# Patient Record
Sex: Male | Born: 1980 | Race: Black or African American | Hispanic: No | Marital: Single | Smoking: Former smoker
Health system: Southern US, Community
[De-identification: ages and names within clinical notes are randomized; demographics above are authoritative.]

## PROBLEM LIST (undated history)

## (undated) DIAGNOSIS — B029 Zoster without complications: Secondary | ICD-10-CM

## (undated) DIAGNOSIS — M1990 Unspecified osteoarthritis, unspecified site: Secondary | ICD-10-CM

## (undated) DIAGNOSIS — Z720 Tobacco use: Secondary | ICD-10-CM

## (undated) DIAGNOSIS — I10 Essential (primary) hypertension: Secondary | ICD-10-CM

## (undated) DIAGNOSIS — J45909 Unspecified asthma, uncomplicated: Secondary | ICD-10-CM

---

## 2007-04-01 ENCOUNTER — Emergency Department: Payer: Self-pay | Admitting: Emergency Medicine

## 2007-04-01 ENCOUNTER — Other Ambulatory Visit: Payer: Self-pay

## 2007-12-16 ENCOUNTER — Emergency Department: Payer: Self-pay | Admitting: Emergency Medicine

## 2008-03-27 ENCOUNTER — Emergency Department: Payer: Self-pay | Admitting: Emergency Medicine

## 2010-09-17 ENCOUNTER — Emergency Department: Payer: Self-pay | Admitting: Emergency Medicine

## 2010-10-15 ENCOUNTER — Emergency Department: Payer: Self-pay | Admitting: *Deleted

## 2010-10-30 ENCOUNTER — Emergency Department: Payer: Self-pay | Admitting: Emergency Medicine

## 2014-07-16 ENCOUNTER — Encounter: Payer: Self-pay | Admitting: Emergency Medicine

## 2014-07-16 ENCOUNTER — Emergency Department: Payer: Self-pay

## 2014-07-16 ENCOUNTER — Emergency Department
Admission: EM | Admit: 2014-07-16 | Discharge: 2014-07-16 | Disposition: A | Discharge status: Home / Self Care | Payer: Self-pay | Attending: Emergency Medicine | Admitting: Emergency Medicine

## 2014-07-16 DIAGNOSIS — Y9289 Other specified places as the place of occurrence of the external cause: Secondary | ICD-10-CM | POA: Insufficient documentation

## 2014-07-16 DIAGNOSIS — S60552A Superficial foreign body of left hand, initial encounter: Secondary | ICD-10-CM | POA: Insufficient documentation

## 2014-07-16 DIAGNOSIS — Y998 Other external cause status: Secondary | ICD-10-CM | POA: Insufficient documentation

## 2014-07-16 DIAGNOSIS — W34010A Accidental discharge of airgun, initial encounter: Secondary | ICD-10-CM | POA: Insufficient documentation

## 2014-07-16 DIAGNOSIS — Z720 Tobacco use: Secondary | ICD-10-CM | POA: Insufficient documentation

## 2014-07-16 DIAGNOSIS — Y9389 Activity, other specified: Secondary | ICD-10-CM | POA: Insufficient documentation

## 2014-07-16 HISTORY — DX: Unspecified asthma, uncomplicated: J45.909

## 2014-07-16 MED ORDER — AMOXICILLIN-POT CLAVULANATE 875-125 MG PO TABS
1.0000 | ORAL_TABLET | Freq: Once | ORAL | Status: AC
  Administered 2014-07-16: 1 via ORAL
  Filled 2014-07-16: qty 1

## 2014-07-16 MED ORDER — BACITRACIN ZINC 500 UNIT/GM EX OINT
TOPICAL_OINTMENT | CUTANEOUS | Status: AC
  Filled 2014-07-16: qty 0.9

## 2014-07-16 MED ORDER — OXYCODONE-ACETAMINOPHEN 5-325 MG PO TABS
1.0000 | ORAL_TABLET | Freq: Once | ORAL | Status: AC
  Administered 2014-07-16: 1 via ORAL
  Filled 2014-07-16: qty 1

## 2014-07-16 MED ORDER — OXYCODONE-ACETAMINOPHEN 5-325 MG PO TABS: 1.0000 | ORAL_TABLET | Freq: Three times a day (TID) | ORAL | Status: DC | PRN

## 2014-07-16 MED ORDER — AMOXICILLIN-POT CLAVULANATE 875-125 MG PO TABS: 1.0000 | ORAL_TABLET | Freq: Two times a day (BID) | ORAL | Status: AC

## 2014-07-16 MED ORDER — IBUPROFEN 800 MG PO TABS: 800.0000 mg | ORAL_TABLET | Freq: Three times a day (TID) | ORAL | Status: DC | PRN

## 2014-07-16 MED ORDER — TETANUS-DIPHTH-ACELL PERTUSSIS 5-2.5-18.5 LF-MCG/0.5 IM SUSP
0.5000 mL | Freq: Once | INTRAMUSCULAR | Status: AC
  Administered 2014-07-16: 0.5 mL via INTRAMUSCULAR
  Filled 2014-07-16: qty 0.5

## 2014-07-16 MED ORDER — LIDOCAINE HCL (PF) 1 % IJ SOLN
10.0000 mL | Freq: Once | INTRAMUSCULAR | Status: AC
  Administered 2014-07-16: 10 mL via SUBCUTANEOUS
  Filled 2014-07-16: qty 10

## 2014-07-16 NOTE — ED Notes (Signed)
Patient with no complaints at this time. Respirations even and unlabored. Skin warm/dry. Discharge instructions reviewed with patient at this time. Patient given opportunity to voice concerns/ask questions. Patient discharged at this time and left Emergency Department with steady gait.   

## 2014-07-16 NOTE — Discharge Instructions (Signed)
You have a foreign body in your hand that was unable to be removed today. Keep clean. Take medication as prescribed.   Follow up with orthopedic in 2 days. See above to call tomorrow morning to schedule appointment. Follow up is important.   Return to ER for swelling, drainage, redness, increased pain,loss of sensation, new or worsening concerns.
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
## 2014-07-16 NOTE — ED Notes (Signed)
Provider Renford DillsLindsey Miller at bedside, to remove foreign body

## 2016-02-03 ENCOUNTER — Encounter: Payer: Self-pay | Admitting: Emergency Medicine

## 2016-02-03 ENCOUNTER — Emergency Department: Payer: Self-pay

## 2016-02-03 ENCOUNTER — Emergency Department
Admission: EM | Admit: 2016-02-03 | Discharge: 2016-02-03 | Disposition: A | Discharge status: Home / Self Care | Payer: Self-pay | Attending: Emergency Medicine | Admitting: Emergency Medicine

## 2016-02-03 DIAGNOSIS — R0789 Other chest pain: Secondary | ICD-10-CM | POA: Insufficient documentation

## 2016-02-03 DIAGNOSIS — F17200 Nicotine dependence, unspecified, uncomplicated: Secondary | ICD-10-CM | POA: Insufficient documentation

## 2016-02-03 DIAGNOSIS — R071 Chest pain on breathing: Secondary | ICD-10-CM

## 2016-02-03 DIAGNOSIS — Z79899 Other long term (current) drug therapy: Secondary | ICD-10-CM | POA: Insufficient documentation

## 2016-02-03 DIAGNOSIS — B9789 Other viral agents as the cause of diseases classified elsewhere: Secondary | ICD-10-CM

## 2016-02-03 DIAGNOSIS — J45909 Unspecified asthma, uncomplicated: Secondary | ICD-10-CM | POA: Insufficient documentation

## 2016-02-03 DIAGNOSIS — J069 Acute upper respiratory infection, unspecified: Secondary | ICD-10-CM | POA: Insufficient documentation

## 2016-02-03 LAB — BASIC METABOLIC PANEL
Anion gap: 9 (ref 5–15)
BUN: 11 mg/dL (ref 6–20)
CALCIUM: 9.1 mg/dL (ref 8.9–10.3)
CO2: 25 mmol/L (ref 22–32)
CREATININE: 0.97 mg/dL (ref 0.61–1.24)
Chloride: 104 mmol/L (ref 101–111)
GFR calc non Af Amer: >60 mL/min (ref >60)
Glucose, Bld: 108 mg/dL — ABNORMAL HIGH (ref 65–99)
Potassium: 4 mmol/L (ref 3.5–5.1)
Sodium: 138 mmol/L (ref 135–145)

## 2016-02-03 LAB — CBC
HCT: 40.2 % (ref 40.0–52.0)
Hemoglobin: 13 g/dL (ref 13.0–18.0)
MCH: 26.5 pg (ref 26.0–34.0)
MCHC: 32.4 g/dL (ref 32.0–36.0)
MCV: 82 fL (ref 80.0–100.0)
PLATELETS: 377 10*3/uL (ref 150–440)
RBC: 4.9 MIL/uL (ref 4.40–5.90)
RDW: 14.4 % (ref 11.5–14.5)
WBC: 7.1 10*3/uL (ref 3.8–10.6)

## 2016-02-03 LAB — TROPONIN I

## 2016-02-03 MED ORDER — IPRATROPIUM-ALBUTEROL 0.5-2.5 (3) MG/3ML IN SOLN
3.0000 mL | Freq: Once | RESPIRATORY_TRACT | Status: AC
  Administered 2016-02-03: 3 mL via RESPIRATORY_TRACT
  Filled 2016-02-03: qty 3

## 2016-02-03 MED ORDER — ALBUTEROL SULFATE HFA 108 (90 BASE) MCG/ACT IN AERS: INHALATION_SPRAY | RESPIRATORY_TRACT | 1 refills | Status: DC

## 2016-02-03 NOTE — Discharge Instructions (Signed)
You have been seen in the Emergency Department (ED) today for chest pain.  As we have discussed today?s test results are normal, and we believe your pain is due to pain/strain and/or inflammation of the muscles and/or cartilage of your chest wall, likely exacerbated by a mild virus (a "cold") and your mild chronic asthma.  We recommend you take ibuprofen 600 mg three times a day with meals for the next 5 days (unless you have been told previously not to take ibuprofen or NSAIDs in general).  You may also take Tylenol according to the label instructions.  Read through the included information for additional treatment recommendations and precautions.  Continue to take your regular medications.   Return to the Emergency Department (ED) if you experience any further chest pain/pressure/tightness, difficulty breathing, or sudden sweating, or other symptoms that concern you.

## 2016-02-03 NOTE — ED Notes (Signed)
Patient transported to X-ray 

## 2016-02-03 NOTE — ED Notes (Signed)
E-signature box not working. Pt verbalized understanding of discharge instructions and denied questions. 

## 2016-02-03 NOTE — ED Triage Notes (Signed)
Pt with c/o chest pain, increasing with coughing and deep breathing. Denies heart history. Non-productive cough.

## 2016-02-03 NOTE — ED Provider Notes (Signed)
[facility] Emergency Department Provider Note  ____________________________________________   First MD Initiated Contact with Patient 02/03/16 1234     (approximate)  I have reviewed the triage vital signs and the nursing notes.   HISTORY  Chief Complaint Chest Pain and Cough    HPI Justin Reid is a 36 y.o. male with a history of tobacco use, asthma, and obesity who presents for evaluation of anterior chest wall pain that started yesterday.  He states that it is worse with coughing and taking deep breaths.  He says that he has been coughing and congested little bit more than usual but does not feel like he has had a cold over the flu.  He ran out of his albuterol this morning and does not have a primary care doctor.  He denies fever/chills, shortness of breath, nausea, vomiting, abdominal pain, dysuria.  He states that his cough is nonproductive.  He has noticed a little bit of wheezing.  Overall his symptoms are mild but he was a little bit concerned because of the chest pain.   Past Medical History:  Diagnosis Date  . Asthma     There are no active problems to display for this patient.   History reviewed. No pertinent surgical history.  Prior to Admission medications   Medication Sig Start Date End Date Taking? Authorizing Provider  albuterol (PROVENTIL HFA;VENTOLIN HFA) 108 (90 Base) MCG/ACT inhaler Inhale 1-2 puffs into the lungs every 6 (six) hours as needed for wheezing or shortness of breath.   Yes Historical Provider, MD  albuterol (PROVENTIL HFA;VENTOLIN HFA) 108 (90 Base) MCG/ACT inhaler Inhale 2-4 puffs by mouth every 4 hours as needed for wheezing, cough, and/or shortness of breath 02/03/16   Loleta Roseory Maximilliano Kersh, MD  ibuprofen (ADVIL,MOTRIN) 800 MG tablet Take 1 tablet (800 mg total) by mouth every 8 (eight) hours as needed for mild pain or moderate pain. 07/16/14   Renford DillsLindsey Miller, NP  oxyCODONE-acetaminophen (ROXICET) 5-325 MG per tablet Take 1  tablet by mouth every 8 (eight) hours as needed for moderate pain or severe pain (Do not drive or operate heavy machinery while taking as can cause drowsiness.). 07/16/14   Renford DillsLindsey Miller, NP    Allergies Patient has no known allergies.  History reviewed. No pertinent family history.  Social History Social History  Substance Use Topics  . Smoking status: Current Every Day Smoker  . Smokeless tobacco: Never Used  . Alcohol use Yes    Review of Systems Constitutional: No fever/chills Eyes: No visual changes. ENT: No sore throat. Cardiovascular: +chest pain, anterior chest wall Respiratory: Denies shortness of breath.  He has had a nonproductive cough for several days Gastrointestinal: No abdominal pain.  No nausea, no vomiting.  No diarrhea.  No constipation. Genitourinary: Negative for dysuria. Musculoskeletal: Negative for back pain. Skin: Negative for rash. Neurological: Negative for headaches, focal weakness or numbness.  10-point ROS otherwise negative.  ____________________________________________   PHYSICAL EXAM:  VITAL SIGNS: ED Triage Vitals  Enc Vitals Group     BP 02/03/16 1037 (!) 149/104     Pulse Rate 02/03/16 1037 91     Resp 02/03/16 1037 18     Temp 02/03/16 1037 98.6 F (37 C)     Temp Source 02/03/16 1037 Oral     SpO2 02/03/16 1037 98 %     Weight 02/03/16 1037 265 lb (120.2 kg)     Height 02/03/16 1037 5\' 10"  (1.778 m)     Head Circumference --  Peak Flow --      Pain Score 02/03/16 1054 9     Pain Loc --      Pain Edu? --      Excl. in GC? --     Constitutional: Alert and oriented. Well appearing and in no acute distress. Eyes: Conjunctivae are normal. PERRL. EOMI. Head: Atraumatic. Nose: No congestion/rhinnorhea. Mouth/Throat: Mucous membranes are moist.  Oropharynx non-erythematous. Neck: No stridor.  No meningeal signs.   Cardiovascular: Normal rate, regular rhythm. Good peripheral circulation. Grossly normal heart sounds.    Respiratory: Normal respiratory effort.  No retractions. Mild expiratory wheezing.  Highly reproducible anterior chest wall pain most notable over the superior sternum. Gastrointestinal: Soft and nontender. No distention.  Musculoskeletal: No lower extremity tenderness nor edema. No gross deformities of extremities. Neurologic:  Normal speech and language. No gross focal neurologic deficits are appreciated.  Skin:  Skin is warm, dry and intact. No rash noted. Psychiatric: Mood and affect are normal. Speech and behavior are normal.  ____________________________________________   LABS (all labs ordered are listed, but only abnormal results are displayed)  Labs Reviewed  BASIC METABOLIC PANEL - Abnormal; Notable for the following:       Result Value   Glucose, Bld 108 (*)    All other components within normal limits  CBC  TROPONIN I   ____________________________________________  EKG  ED ECG REPORT I, Danah Reinecke, the attending physician, personally viewed and interpreted this ECG.  Date: 02/03/2016 EKG Time: 10:36 Rate: 93 Rhythm: normal sinus rhythm with mild sinus Arrhythmia QRS Axis: normal Intervals: normal ST/T Wave abnormalities: normal Conduction Disturbances: none Narrative Interpretation: unremarkable  ____________________________________________  RADIOLOGY   Dg Chest 2 View  Result Date: 02/03/2016 CLINICAL DATA:  Patient reports central chest pain onset yesterday. Hx asthma. Current smoker. EXAM: CHEST  2 VIEW COMPARISON:  03/27/2008 FINDINGS: The heart size and mediastinal contours are within normal limits. Both lungs are clear. No pleural effusion or pneumothorax. The visualized skeletal structures are unremarkable. IMPRESSION: No active cardiopulmonary disease. Electronically Signed   By: Amie Portland M.D.   On: 02/03/2016 11:32    ____________________________________________   PROCEDURES  Procedure(s) performed:   Procedures   Critical Care  performed: No ____________________________________________   INITIAL IMPRESSION / ASSESSMENT AND PLAN / ED COURSE  Pertinent labs & imaging results that were available during my care of the patient were reviewed by me and considered in my medical decision making (see chart for details).  HEART score is 2 for risk factors with the patient is very low risk of this being ACS. He is PERC negative.  His lab work, vital signs, chest x-ray, and EKG are all reassuring.  I had my usual and customary chest wall pain discussion with the patient I will also treat him with albuterol given the very mild expiratory wheezing and the fact that he ran out of medication.  I gave my usual and customary return precautions and he will follow-up as an outpatient.  He understands and agrees with the plan.   Clinical Course as of Feb 02 1306  Tue Feb 03, 2016  1306 Well appearing, NAD, feels better after Duoneb.  Discharged with usual/customary return precautinos  [CF]    Clinical Course User Index [CF] Loleta Rose, MD    ____________________________________________  FINAL CLINICAL IMPRESSION(S) / ED DIAGNOSES  Final diagnoses:  Pain of anterior chest wall with respiration  Viral URI with cough     MEDICATIONS GIVEN DURING THIS VISIT:  Medications  ipratropium-albuterol (DUONEB) 0.5-2.5 (3) MG/3ML nebulizer solution 3 mL (3 mLs Nebulization Given 02/03/16 1253)     NEW OUTPATIENT MEDICATIONS STARTED DURING THIS VISIT:  New Prescriptions   ALBUTEROL (PROVENTIL HFA;VENTOLIN HFA) 108 (90 BASE) MCG/ACT INHALER    Inhale 2-4 puffs by mouth every 4 hours as needed for wheezing, cough, and/or shortness of breath    Modified Medications   No medications on file    Discontinued Medications   ALBUTEROL (PROVENTIL HFA;VENTOLIN HFA) 108 (90 BASE) MCG/ACT INHALER    Inhale into the lungs every 6 (six) hours as needed for wheezing or shortness of breath.     Note:  This document was prepared using  Dragon voice recognition software and may include unintentional dictation errors.    Loleta Rose, MD 02/03/16 1308

## 2016-02-20 ENCOUNTER — Encounter: Payer: Self-pay | Admitting: Emergency Medicine

## 2016-02-20 ENCOUNTER — Inpatient Hospital Stay
Admission: EM | Admit: 2016-02-20 | Discharge: 2016-02-23 | DRG: 193 | Disposition: A | Discharge status: Home / Self Care | Payer: Self-pay | Attending: Internal Medicine | Admitting: Internal Medicine

## 2016-02-20 ENCOUNTER — Emergency Department: Payer: Self-pay

## 2016-02-20 DIAGNOSIS — Z23 Encounter for immunization: Secondary | ICD-10-CM

## 2016-02-20 DIAGNOSIS — Z79899 Other long term (current) drug therapy: Secondary | ICD-10-CM

## 2016-02-20 DIAGNOSIS — J101 Influenza due to other identified influenza virus with other respiratory manifestations: Principal | ICD-10-CM | POA: Diagnosis present

## 2016-02-20 DIAGNOSIS — J4551 Severe persistent asthma with (acute) exacerbation: Secondary | ICD-10-CM | POA: Diagnosis present

## 2016-02-20 DIAGNOSIS — J9601 Acute respiratory failure with hypoxia: Secondary | ICD-10-CM | POA: Diagnosis present

## 2016-02-20 DIAGNOSIS — F17210 Nicotine dependence, cigarettes, uncomplicated: Secondary | ICD-10-CM | POA: Diagnosis present

## 2016-02-20 DIAGNOSIS — R262 Difficulty in walking, not elsewhere classified: Secondary | ICD-10-CM

## 2016-02-20 DIAGNOSIS — J45901 Unspecified asthma with (acute) exacerbation: Secondary | ICD-10-CM | POA: Diagnosis present

## 2016-02-20 DIAGNOSIS — Z79891 Long term (current) use of opiate analgesic: Secondary | ICD-10-CM

## 2016-02-20 DIAGNOSIS — J4531 Mild persistent asthma with (acute) exacerbation: Secondary | ICD-10-CM

## 2016-02-20 DIAGNOSIS — Z6835 Body mass index (BMI) 35.0-35.9, adult: Secondary | ICD-10-CM

## 2016-02-20 LAB — BLOOD GAS, ARTERIAL
Acid-Base Excess: 3.1 mmol/L — ABNORMAL HIGH (ref 0.0–2.0)
Bicarbonate: 23.1 mmol/L (ref 20.0–28.0)
Delivery systems: POSITIVE
EXPIRATORY PAP: 5
FIO2: 0.28
INSPIRATORY PAP: 12
O2 Saturation: 99.7 %
PO2 ART: 167 mmHg — AB (ref 83.0–108.0)
Patient temperature: 37
pCO2 arterial: 23 mmHg — ABNORMAL LOW (ref 32.0–48.0)
pH, Arterial: 7.61 (ref 7.350–7.450)

## 2016-02-20 LAB — CBC
HCT: 34.6 % — ABNORMAL LOW (ref 40.0–52.0)
HEMOGLOBIN: 11.9 g/dL — AB (ref 13.0–18.0)
MCH: 27.7 pg (ref 26.0–34.0)
MCHC: 34.4 g/dL (ref 32.0–36.0)
MCV: 80.6 fL (ref 80.0–100.0)
PLATELETS: 336 10*3/uL (ref 150–440)
RBC: 4.3 MIL/uL — AB (ref 4.40–5.90)
RDW: 14.5 % (ref 11.5–14.5)
WBC: 8 10*3/uL (ref 3.8–10.6)

## 2016-02-20 LAB — MAGNESIUM: Magnesium: 1.9 mg/dL (ref 1.7–2.4)

## 2016-02-20 LAB — BASIC METABOLIC PANEL
ANION GAP: 11 (ref 5–15)
BUN: 16 mg/dL (ref 6–20)
CHLORIDE: 101 mmol/L (ref 101–111)
CO2: 23 mmol/L (ref 22–32)
CREATININE: 1.27 mg/dL — AB (ref 0.61–1.24)
Calcium: 8.8 mg/dL — ABNORMAL LOW (ref 8.9–10.3)
GFR calc non Af Amer: >60 mL/min (ref >60)
Glucose, Bld: 164 mg/dL — ABNORMAL HIGH (ref 65–99)
POTASSIUM: 3.8 mmol/L (ref 3.5–5.1)
SODIUM: 135 mmol/L (ref 135–145)

## 2016-02-20 LAB — GLUCOSE, CAPILLARY
GLUCOSE-CAPILLARY: 144 mg/dL — AB (ref 65–99)
Glucose-Capillary: 179 mg/dL — ABNORMAL HIGH (ref 65–99)

## 2016-02-20 LAB — INFLUENZA PANEL BY PCR (TYPE A & B)
INFLAPCR: NEGATIVE
Influenza B By PCR: POSITIVE — AB

## 2016-02-20 LAB — MRSA PCR SCREENING: MRSA by PCR: NEGATIVE

## 2016-02-20 LAB — PHOSPHORUS: Phosphorus: 3.2 mg/dL (ref 2.5–4.6)

## 2016-02-20 MED ORDER — SODIUM CHLORIDE 0.9 % IV SOLN
INTRAVENOUS | Status: DC
  Administered 2016-02-20 (×2): via INTRAVENOUS

## 2016-02-20 MED ORDER — MAGNESIUM CITRATE PO SOLN: 1.0000 | Freq: Once | ORAL | Status: DC | PRN

## 2016-02-20 MED ORDER — ALBUTEROL SULFATE (2.5 MG/3ML) 0.083% IN NEBU
INHALATION_SOLUTION | RESPIRATORY_TRACT | Status: AC
  Administered 2016-02-20: 7.5 mg via RESPIRATORY_TRACT
  Filled 2016-02-20: qty 9

## 2016-02-20 MED ORDER — ONDANSETRON HCL 4 MG PO TABS: 4.0000 mg | ORAL_TABLET | Freq: Four times a day (QID) | ORAL | Status: DC | PRN

## 2016-02-20 MED ORDER — ACETAMINOPHEN 650 MG RE SUPP: 650.0000 mg | Freq: Four times a day (QID) | RECTAL | Status: DC | PRN

## 2016-02-20 MED ORDER — ENOXAPARIN SODIUM 40 MG/0.4ML ~~LOC~~ SOLN
40.0000 mg | SUBCUTANEOUS | Status: DC
  Administered 2016-02-20 – 2016-02-22 (×3): 40 mg via SUBCUTANEOUS
  Filled 2016-02-20 (×4): qty 0.4

## 2016-02-20 MED ORDER — OXYCODONE HCL 5 MG PO TABS
5.0000 mg | ORAL_TABLET | ORAL | Status: DC | PRN
Start: 2016-02-20 — End: 2016-02-23
  Administered 2016-02-20 – 2016-02-23 (×7): 5 mg via ORAL
  Filled 2016-02-20 (×7): qty 1

## 2016-02-20 MED ORDER — IPRATROPIUM BROMIDE 0.02 % IN SOLN
0.5000 mg | Freq: Four times a day (QID) | RESPIRATORY_TRACT | Status: DC | PRN
  Administered 2016-02-20 – 2016-02-21 (×2): 0.5 mg via RESPIRATORY_TRACT
  Filled 2016-02-20 (×2): qty 2.5

## 2016-02-20 MED ORDER — ALBUTEROL SULFATE (2.5 MG/3ML) 0.083% IN NEBU
2.5000 mg | INHALATION_SOLUTION | Freq: Once | RESPIRATORY_TRACT | Status: AC
  Administered 2016-02-20: 2.5 mg via RESPIRATORY_TRACT
  Filled 2016-02-20: qty 3

## 2016-02-20 MED ORDER — SODIUM CHLORIDE 0.9% FLUSH
3.0000 mL | Freq: Two times a day (BID) | INTRAVENOUS | Status: DC
  Administered 2016-02-20 – 2016-02-22 (×5): 3 mL via INTRAVENOUS

## 2016-02-20 MED ORDER — ALBUTEROL SULFATE (2.5 MG/3ML) 0.083% IN NEBU
2.5000 mg | INHALATION_SOLUTION | Freq: Four times a day (QID) | RESPIRATORY_TRACT | Status: DC | PRN
  Administered 2016-02-20: 2.5 mg via RESPIRATORY_TRACT
  Filled 2016-02-20: qty 3

## 2016-02-20 MED ORDER — ACETAMINOPHEN 325 MG PO TABS: 650.0000 mg | ORAL_TABLET | Freq: Four times a day (QID) | ORAL | Status: DC | PRN

## 2016-02-20 MED ORDER — BISACODYL 5 MG PO TBEC: 5.0000 mg | DELAYED_RELEASE_TABLET | Freq: Every day | ORAL | Status: DC | PRN

## 2016-02-20 MED ORDER — IPRATROPIUM-ALBUTEROL 0.5-2.5 (3) MG/3ML IN SOLN
3.0000 mL | Freq: Once | RESPIRATORY_TRACT | Status: AC
  Administered 2016-02-20: 3 mL via RESPIRATORY_TRACT

## 2016-02-20 MED ORDER — METHYLPREDNISOLONE SODIUM SUCC 125 MG IJ SOLR
60.0000 mg | Freq: Four times a day (QID) | INTRAMUSCULAR | Status: DC
  Administered 2016-02-20 – 2016-02-21 (×4): 60 mg via INTRAVENOUS
  Filled 2016-02-20 (×4): qty 2

## 2016-02-20 MED ORDER — ALBUTEROL SULFATE (2.5 MG/3ML) 0.083% IN NEBU
7.5000 mg | INHALATION_SOLUTION | Freq: Once | RESPIRATORY_TRACT | Status: AC
  Administered 2016-02-20: 7.5 mg via RESPIRATORY_TRACT

## 2016-02-20 MED ORDER — ALBUTEROL SULFATE (2.5 MG/3ML) 0.083% IN NEBU
5.0000 mg | INHALATION_SOLUTION | Freq: Once | RESPIRATORY_TRACT | Status: AC
  Administered 2016-02-20: 5 mg via RESPIRATORY_TRACT

## 2016-02-20 MED ORDER — ONDANSETRON HCL 4 MG/2ML IJ SOLN: 4.0000 mg | Freq: Four times a day (QID) | INTRAMUSCULAR | Status: DC | PRN

## 2016-02-20 MED ORDER — IPRATROPIUM-ALBUTEROL 0.5-2.5 (3) MG/3ML IN SOLN
RESPIRATORY_TRACT | Status: AC
  Administered 2016-02-20: 3 mL via RESPIRATORY_TRACT
  Filled 2016-02-20: qty 3

## 2016-02-20 MED ORDER — ALBUTEROL SULFATE (2.5 MG/3ML) 0.083% IN NEBU
2.5000 mg | INHALATION_SOLUTION | RESPIRATORY_TRACT | Status: DC | PRN
  Administered 2016-02-20 – 2016-02-21 (×2): 2.5 mg via RESPIRATORY_TRACT
  Filled 2016-02-20 (×2): qty 3

## 2016-02-20 MED ORDER — SENNOSIDES-DOCUSATE SODIUM 8.6-50 MG PO TABS: 1.0000 | ORAL_TABLET | Freq: Every evening | ORAL | Status: DC | PRN

## 2016-02-20 MED ORDER — OSELTAMIVIR PHOSPHATE 75 MG PO CAPS
75.0000 mg | ORAL_CAPSULE | Freq: Two times a day (BID) | ORAL | Status: DC
  Administered 2016-02-20 – 2016-02-23 (×6): 75 mg via ORAL
  Filled 2016-02-20 (×6): qty 1

## 2016-02-20 NOTE — ED Notes (Signed)
ED Provider at bedside. 

## 2016-02-20 NOTE — Care Management Note (Addendum)
Case Management Note  Patient Details  Name: Justin Reid MRN: [id number] Date of Birth: 05-Sep-1980  Subjective/Objective:                   Spoke briefly with patient as he is requiring BiPAP currently and I did not want to exert his O2. He has problems paying for medications. He does not have a PCP for his asthma.  Action/Plan: Referral to [facility] and [facility].  He will also need to be followed for [facility] assistance by weekend CM if patient discharges over weekend. Applications to [facility] left with patient. I met again with patient to deliver applications and encouraged him to follow through with plan of care and discharge referrals. He agreed. I explained that Medication management was a long-term plan and that if MATCH is needed it can only be accessed one time- he again agreed.   Expected Discharge Date:                  Expected Discharge Plan:     In-House Referral:     Discharge planning Services  CM Consult, Medication Assistance, [facility], [facility]  Post Acute Care Choice:    Choice offered to:  Patient  DME Arranged:    DME Agency:     HH Arranged:    [place] Agency:     Status of Service:  In process, will continue to follow  If discussed at Long Length of Stay Meetings, dates discussed:    Additional Comments:  Marshell Garfinkel, RN 02/20/2016, 9:12 AM

## 2016-02-20 NOTE — Progress Notes (Addendum)
[facility] [place] [facility] [place]   PATIENT NAME: Justin Reid    MR#:  [id number]  DATE OF BIRTH:  Feb 06, 1980  SUBJECTIVE:  Patient still tight and sob  REVIEW OF SYSTEMS:    Review of Systems  Constitutional: Positive for malaise/fatigue. Negative for chills and fever.  HENT: Negative.  Negative for ear discharge, ear pain, hearing loss, nosebleeds and sore throat.   Eyes: Negative.  Negative for blurred vision and pain.  Respiratory: Positive for shortness of breath and wheezing. Negative for cough and hemoptysis.   Cardiovascular: Negative.  Negative for chest pain, palpitations and leg swelling.  Gastrointestinal: Negative.  Negative for abdominal pain, blood in stool, diarrhea, nausea and vomiting.  Genitourinary: Negative.  Negative for dysuria.  Musculoskeletal: Negative.  Negative for back pain.  Skin: Negative.   Neurological: Positive for weakness. Negative for dizziness, tremors, speech change, focal weakness, seizures and headaches.  Endo/Heme/Allergies: Negative.  Does not bruise/bleed easily.  Psychiatric/Behavioral: Negative.  Negative for depression, hallucinations and suicidal ideas.    Tolerating Diet: yes      DRUG ALLERGIES:  No Known Allergies  VITALS:  Blood pressure 137/88, pulse 83, temperature 97.6 F (36.4 C), temperature source Axillary, resp. rate 15, height 6' (1.829 m), weight 119.9 kg (264 lb 5.3 oz), SpO2 97 %.  PHYSICAL EXAMINATION:   Physical Exam  Constitutional: He is oriented to person, place, and time and well-developed, well-nourished, and in no distress. No distress.  Wearing BIPAP  HENT:  Head: Normocephalic.  Eyes: No scleral icterus.  Neck: Normal range of motion. Neck supple. No JVD present. No tracheal deviation present.  Cardiovascular: Normal rate, regular rhythm and normal heart sounds.  Exam reveals no gallop and no friction rub.   No murmur heard. Pulmonary/Chest: Effort normal. No respiratory  distress. He has wheezes. He has no rales. He exhibits no tenderness.  Tight sounding fair air entry  Abdominal: Soft. Bowel sounds are normal. He exhibits no distension and no mass. There is no tenderness. There is no rebound and no guarding.  Musculoskeletal: Normal range of motion. He exhibits no edema.  Neurological: He is alert and oriented to person, place, and time.  Skin: Skin is warm. No rash noted. No erythema.  Psychiatric: Affect and judgment normal.      LABORATORY PANEL:   CBC  Recent Labs Lab 02/20/16 0852  WBC 8.0  HGB 11.9*  HCT 34.6*  PLT 336   ------------------------------------------------------------------------------------------------------------------  Chemistries   Recent Labs Lab 02/20/16 0852  NA 135  K 3.8  CL 101  CO2 23  GLUCOSE 164*  BUN 16  CREATININE 1.27*  CALCIUM 8.8*  MG 1.9   ------------------------------------------------------------------------------------------------------------------  Cardiac Enzymes No results for input(s): TROPONINI in the last 168 hours. ------------------------------------------------------------------------------------------------------------------  RADIOLOGY:  Dg Chest Portable 1 View  Result Date: 02/20/2016 CLINICAL DATA:  Cough and wheeze x2 days EXAM: PORTABLE CHEST 1 VIEW COMPARISON:  02/03/2016 CXR FINDINGS: The heart size and mediastinal contours are within normal limits. Both lungs are clear. The visualized skeletal structures are unremarkable. IMPRESSION: No active disease. Electronically Signed   By: Tollie Eth M.D.   On: 02/20/2016 01:05     ASSESSMENT AND PLAN:    36 year old male with history of asthma who presents with acute exacerbation of asthma.  1. Acute hypoxic respiratory failure due to Acute asthma exacerbation: Patient will need to continue BiPAP. Continue steroids and nebulizers  2. Influenza B: Continue Tamiflu for total 5 days  Management plans discussed with  the patient and he is in agreement.  CODE STATUS: FULL  CRITICAL CARETOTAL TIME TAKING CARE OF THIS PATIENT: 30 minutes.  High risk of resp failure/intubation   POSSIBLE D/C 2-4 days, DEPENDING ON CLINICAL CONDITION.   Stanley Helmuth M.D on 02/20/2016 at 12:11 PM  Between 7am to 6pm - Pager - [phone] After 6pm go to [email or web address] - Social research officer, governmentpassword [email or web address]  [facility]  Office  [phone]  CC: Primary care physician; No PCP Per Patient  Note: This dictation was prepared with Dragon dictation along with smaller phrase technology. Any transcriptional errors that result from this process are unintentional.

## 2016-02-20 NOTE — ED Provider Notes (Addendum)
[facility] Emergency Department Provider Note    First MD Initiated Contact with Patient 02/20/16 [phone]     (approximate)  I have reviewed the triage vital signs and the nursing notes.   HISTORY  Chief Complaint Shortness of Breath   HPI Justin Reid is a 36 y.o. male with history of asthma presents with 2 day history of cough congestion and wheezing and subjective fever. Patient presented via EMS and rest or distress status post receiving 2 DuoNeb's treatment as well as 125 mg of Solu-Medrol. Patient admits to one sick contact his child which she believes may have the flu.   Past Medical History:  Diagnosis Date  . Asthma     There are no active problems to display for this patient.   History reviewed. No pertinent surgical history.  Prior to Admission medications   Medication Sig Start Date End Date Taking? Authorizing Provider  albuterol (PROVENTIL HFA;VENTOLIN HFA) 108 (90 Base) MCG/ACT inhaler Inhale 2-4 puffs by mouth every 4 hours as needed for wheezing, cough, and/or shortness of breath 02/03/16   Loleta Roseory Forbach, MD  albuterol (PROVENTIL HFA;VENTOLIN HFA) 108 (90 Base) MCG/ACT inhaler Inhale 1-2 puffs into the lungs every 6 (six) hours as needed for wheezing or shortness of breath.    Historical Provider, MD  ibuprofen (ADVIL,MOTRIN) 800 MG tablet Take 1 tablet (800 mg total) by mouth every 8 (eight) hours as needed for mild pain or moderate pain. 07/16/14   Renford DillsLindsey Miller, NP  oxyCODONE-acetaminophen (ROXICET) 5-325 MG per tablet Take 1 tablet by mouth every 8 (eight) hours as needed for moderate pain or severe pain (Do not drive or operate heavy machinery while taking as can cause drowsiness.). 07/16/14   Renford DillsLindsey Miller, NP    Allergies Patient has no known allergies.  No family history on file.  Social History Social History  Substance Use Topics  . Smoking status: Current Every Day Smoker  . Smokeless tobacco: Never Used  . Alcohol  use Yes    Review of Systems Constitutional: Positive for fever/chills Eyes: No visual changes. ENT: No sore throat. Cardiovascular: Denies chest pain. Respiratory: Positive for cough dyspnea Gastrointestinal: No abdominal pain.  No nausea, no vomiting.  No diarrhea.  No constipation. Genitourinary: Negative for dysuria. Musculoskeletal: Negative for back pain. Skin: Negative for rash. Neurological: Negative for headaches, focal weakness or numbness.  10-point ROS otherwise negative.  ____________________________________________   PHYSICAL EXAM:  VITAL SIGNS: ED Triage Vitals [02/20/16 0029]  Enc Vitals Group     BP 126/79     Pulse Rate (!) 114     Resp (!) 23     Temp 99.6 F (37.6 C)     Temp Source Oral     SpO2 99 %     Weight 265 lb (120.2 kg)     Height 5\' 10"  (1.778 m)     Head Circumference      Peak Flow      Pain Score      Pain Loc      Pain Edu?      Excl. in GC?     Constitutional: Alert and oriented. Apparent respiratory distress. Eyes: Conjunctivae are normal. PERRL. EOMI. Head: Atraumatic. Ears:  Healthy appearing ear canals and TMs bilaterally Nose: No congestion/rhinnorhea. Mouth/Throat: Mucous membranes are moist. Neck: No stridor.  No meningeal signs.   Cardiovascular: Tachycardia regular rhythm. Good peripheral circulation. Grossly normal heart sounds. Respiratory: Tachypnea, diffuse expiratory wheezes, positive accessory respiratory muscle  use Gastrointestinal: Soft and nontender. No distention.  Musculoskeletal: No lower extremity tenderness nor edema. No gross deformities of extremities. Neurologic:  Normal speech and language. No gross focal neurologic deficits are appreciated.  Skin:  Skin is warm, dry and intact. No rash noted.   ____________________________________________   LABS (all labs ordered are listed, but only abnormal results are displayed)  Labs Reviewed  INFLUENZA PANEL BY PCR (TYPE A & B) - Abnormal; Notable for  the following:       Result Value   Influenza B By PCR POSITIVE (*)    All other components within normal limits  BLOOD GAS, ARTERIAL   ____________________________________________  EKG  ED ECG REPORT I, [place] N Lailie Smead, the attending physician, personally viewed and interpreted this ECG.   Date: 02/20/2016  EKG Time: 12:52 AM  Rate: 119  Rhythm normal sinus rhythm  Axis: Normal  Intervals: Normal  ST&T Change: None  RADIOLOGY I, [place] N Braden Cimo, personally viewed and evaluated these images (plain radiographs) as part of my medical decision making, as well as reviewing the written report by the radiologist.  Dg Chest Portable 1 View  Result Date: 02/20/2016 CLINICAL DATA:  Cough and wheeze x2 days EXAM: PORTABLE CHEST 1 VIEW COMPARISON:  02/03/2016 CXR FINDINGS: The heart size and mediastinal contours are within normal limits. Both lungs are clear. The visualized skeletal structures are unremarkable. IMPRESSION: No active disease. Electronically Signed   By: Tollie Eth M.D.   On: 02/20/2016 01:05     Procedures   Critical Care performed: CRITICAL CARE Performed by: Darci Current   Total critical care time: 45 minutes  Critical care time was exclusive of separately billable procedures and treating other patients.  Critical care was necessary to treat or prevent imminent or life-threatening deterioration.  Critical care was time spent personally by me on the following activities: development of treatment plan with patient and/or surrogate as well as nursing, discussions with consultants, evaluation of patient's response to treatment, examination of patient, obtaining history from patient or surrogate, ordering and performing treatments and interventions, ordering and review of laboratory studies, ordering and review of radiographic studies, pulse oximetry and re-evaluation of patient's condition.  ____________________________________________   INITIAL IMPRESSION /  ASSESSMENT AND PLAN / ED COURSE  Pertinent labs & imaging results that were available during my care of the patient were reviewed by me and considered in my medical decision making (see chart for details).  Patient received an additional DuoNeb in the emergency department on arrival. Patient continued to be is same clinical state and a such 3 albuterol nebulized treatments were administered. Patient continued to be tachypnea with accessory rest or muscle use and a such BiPAP was applied. Patient noted to be positive for the flu. Patient discussed with [facility]. [facility] admission for further evaluation and management    ____________________________________________  FINAL CLINICAL IMPRESSION(S) / ED DIAGNOSES  Final diagnoses:  Influenza B  Severe persistent asthma with exacerbation     MEDICATIONS GIVEN DURING THIS VISIT:  Medications  albuterol (PROVENTIL) (2.5 MG/3ML) 0.083% nebulizer solution 5 mg (5 mg Nebulization Given 02/20/16 0037)  ipratropium-albuterol (DUONEB) 0.5-2.5 (3) MG/3ML nebulizer solution 3 mL (3 mLs Nebulization Given 02/20/16 0135)  albuterol (PROVENTIL) (2.5 MG/3ML) 0.083% nebulizer solution 7.5 mg (7.5 mg Nebulization Given 02/20/16 0203)     NEW OUTPATIENT MEDICATIONS STARTED DURING THIS VISIT:  New Prescriptions   No medications on file    Modified Medications   No medications on file  Discontinued Medications   No medications on file     Note:  This document was prepared using Dragon voice recognition software and may include unintentional dictation errors.    Darci Current, MD 02/20/16 1610    Darci Current, MD 02/20/16 [phone]

## 2016-02-20 NOTE — Progress Notes (Signed)
Pt returned to Bipap due to increased WOB

## 2016-02-20 NOTE — ED Notes (Signed)
RT called to set up patient on BiPaP.

## 2016-02-20 NOTE — Progress Notes (Signed)
[facility]  Critical Care Medicine Consultation     ASSESSMENT/PLAN   Patient with chronic persistent asthma with recurrent exacerbation. Is home regimen did not include inhaled corticosteroids. He was using mostly short-acting beta agonist as needed. Was also recently seen in the emergency room 2 weeks prior for the same symptoms. Now with influenza B positive presently on Tamiflu. Continue Solu-Medrol, albuterol, Atrovent, will add inhaled corticosteroid as we wean down on the Solu-Medrol. Patient will need to go home on inhaled cortical steroid with long-acting beta agonist with short-acting rescue. We'll continue noninvasive ventilation at this point and try to give him time off supplemental oxygen. We'll obtain baseline laboratory evaluation today did chest x-ray reviewed is clear with hyperinflation without any parenchymal infiltrates.   ---------------------------------------  ---------------------------------------   Name: Justin Reid MRN: [id number] DOB: [birth year]    ADMISSION DATE:  02/20/2016 CONSULTATION DATE:  02/20/2016  REFERRING MD :  Dr. Juliene Pina  CHIEF COMPLAINT:  Shortness of breath   HISTORY OF PRESENT ILLNESS:  Justin Reid is a 36 year old African American gentleman with a past medical history remarkable for chronic persistent asthma, his normal state of health until approximately 2 days prior where he developed flulike symptoms to include fever, chills, night sweats, generalized Minasian, body aches, cough or congestion and diffuse expiratory wheezing. He states his home inhaler regimen includes albuterol. Denies being on inhaled cortical steroids or recently on systemic steroids. He presented to the emergency department instruments, will start her on noninvasive ventilation and admitted to the intensive care unit. Presently he is awake, alert, communicating, states he is feeling better, continues to be on noninvasive ventilation.  PAST MEDICAL HISTORY :  Past  Medical History:  Diagnosis Date  . Asthma    History reviewed. No pertinent surgical history. Prior to Admission medications   Medication Sig Start Date End Date Taking? Authorizing Provider  albuterol (PROVENTIL HFA;VENTOLIN HFA) 108 (90 Base) MCG/ACT inhaler Inhale 2-4 puffs by mouth every 4 hours as needed for wheezing, cough, and/or shortness of breath 02/03/16  Yes Loleta Rose, MD   No Known Allergies  FAMILY HISTORY:  No family history on file. SOCIAL HISTORY:  reports that he has been smoking.  He has never used smokeless tobacco. He reports that he drinks alcohol. His drug history is not on file.  REVIEW OF SYSTEMS:   Constitutional: Feels well. Cardiovascular: No chest pain.  Pulmonary: Denies dyspnea.   The remainder of systems were reviewed and were found to be negative other than what is documented in the HPI.    VITAL SIGNS: Temp:  [97.8 F (36.6 C)-99.6 F (37.6 C)] 97.8 F (36.6 C) (02/16 0600) Pulse Rate:  [96-115] 96 (02/16 0700) Resp:  [14-28] 19 (02/16 0700) BP: (126-161)/(74-96) 143/96 (02/16 0700) SpO2:  [97 %-100 %] 100 % (02/16 0700) FiO2 (%):  [28 %] 28 % (02/16 0600) Weight:  [119.9 kg (264 lb 5.3 oz)-120.2 kg (265 lb)] 119.9 kg (264 lb 5.3 oz) (02/16 0600) HEMODYNAMICS:   VENTILATOR SETTINGS: FiO2 (%):  [28 %] 28 % INTAKE / OUTPUT: No intake or output data in the 24 hours ending 02/20/16 0838  Physical Examination:   VS: BP (!) 143/96   Pulse 96   Temp 97.8 F (36.6 C) (Axillary)   Resp 19   Ht 6' (1.829 m)   Wt 119.9 kg (264 lb 5.3 oz)   SpO2 100%   BMI 35.85 kg/m   General Appearance: Awake, alert, oriented, in mild respiratory distress on noninvasive ventilation  Neuro:without focal findings, mental status, speech normal,. HEENT: PERRLA, EOM intact, no ptosis, no other lesions noticed;  Pulmonary: Prolonged expiratory phase, diffuse expiratory wheezing appreciated with central congestion CardiovascularNormal S1,S2.  No m/r/g.      Abdomen: Benign, Soft, non-tender, No masses, hepatosplenomegaly, No lymphadenopathy Renal:  No costovertebral tenderness  GU:  Not performed at this time. Endoc: No evident thyromegaly, no signs of acromegaly. Skin:   warm, no rashes, no ecchymosis  Extremities: normal, no cyanosis, clubbing, no edema, warm with normal capillary refill.    LABS: Reviewed   LABORATORY PANEL:   CBC No results for input(s): WBC, HGB, HCT, PLT in the last 168 hours.  Chemistries  No results for input(s): NA, K, CL, CO2, GLUCOSE, BUN, CREATININE, CALCIUM, MG, PHOS, AST, ALT, ALKPHOS, BILITOT in the last 168 hours.  Invalid input(s): GFRCGP   Recent Labs Lab 02/20/16 0633  GLUCAP 179*    Recent Labs Lab 02/20/16 0203  PHART 7.61*  PCO2ART 23*  PO2ART 167*   No results for input(s): AST, ALT, ALKPHOS, BILITOT, ALBUMIN in the last 168 hours.  Cardiac Enzymes No results for input(s): TROPONINI in the last 168 hours.  RADIOLOGY:  Dg Chest Portable 1 View  Result Date: 02/20/2016 CLINICAL DATA:  Cough and wheeze x2 days EXAM: PORTABLE CHEST 1 VIEW COMPARISON:  02/03/2016 CXR FINDINGS: The heart size and mediastinal contours are within normal limits. Both lungs are clear. The visualized skeletal structures are unremarkable. IMPRESSION: No active disease. Electronically Signed   By: Tollie Ethavid  Kwon M.D.   On: 02/20/2016 01:05     Tora KindredJohn Wylie Coon, DO [facility] Office Number: [phone]  Santiago Gladavid Kasa, M.D.  Stephanie AcreVishal Mungal, M.D.  Billy Fischeravid Simonds, M.D   02/20/2016, 8:38 AM

## 2016-02-20 NOTE — H&P (Signed)
History and Physical   [facility] [place] [facility] Admission History and Physical [employer] Chelsi Warr, D.O.    Patient Name: Justin Reid MR#: [id number] Date of Birth: 06/12/1980 Date of Admission: 02/20/2016  Referring MD/NP/PA: Dr. Manson PasseyBrown Primary Care Physician: No PCP Per Patient Outpatient Specialists: None  Patient coming from: Home  Chief Complaint: Shortness of breath  HPI: Justin Reid 36 y.o. male with Reid known history of asthma was in Reid usual state of health until 2 days ago when he developed flulike symptoms including fevers, chills, generalized fatigue, muscle aches, cough with congestion. Patient states that he developed shortness of breath this morning that was refractory to home inhalers. He came to the emergency department for severe shortness of breath. He states that he took some Advil but no other medications. He has not sought any other medical attention for his symptoms.   Otherwise there has been no change in status. Patient has been taking medication as prescribed and there has been no recent change in medication or diet.  No recent antibiotics.  There has been no recent illness, hospitalizations, travel or sick contacts.    Patient denies dizziness, chest pain, N/V/C/D, abdominal pain, dysuria/frequency, changes in mental status.   ED Course: Patient received Solu-Medrol, DuoNeb's, trial of BiPAP  Review of Systems:  CONSTITUTIONAL: Positive fever/chills, fatigue, weakness, headache. EYES: No blurry or double vision. ENT: No tinnitus, postnasal drip, redness or soreness of the oropharynx. RESPIRATORY: Positive cough, congestion, dyspnea, wheeze.  No hemoptysis.  CARDIOVASCULAR: No chest pain, palpitations, syncope, orthopnea. No lower extremity edema.  GASTROINTESTINAL: No nausea, vomiting, abdominal pain, diarrhea, constipation.  No hematemesis, melena or hematochezia. GENITOURINARY: No dysuria, frequency, hematuria. ENDOCRINE: No polyuria or  nocturia. No heat or cold intolerance. HEMATOLOGY: No anemia, bruising, bleeding. INTEGUMENTARY: No rashes, ulcers, lesions. MUSCULOSKELETAL: No arthritis, gout, dyspnea. NEUROLOGIC: No numbness, tingling, ataxia, seizure-type activity, weakness. PSYCHIATRIC: No anxiety, depression, insomnia.   Past Medical History:  Diagnosis Date  . Asthma     History reviewed. No pertinent surgical history.   reports that he has been smoking.  He has never used smokeless tobacco. He reports that he drinks alcohol. His drug history is not on file.  No Known Allergies  No family history on file.   Prior to Admission medications   Medication Sig Start Date End Date Taking? Authorizing Provider  albuterol (PROVENTIL HFA;VENTOLIN HFA) 108 (90 Base) MCG/ACT inhaler Inhale 2-4 puffs by mouth every 4 hours as needed for wheezing, cough, and/or shortness of breath 02/03/16   Loleta Roseory Forbach, MD  albuterol (PROVENTIL HFA;VENTOLIN HFA) 108 (90 Base) MCG/ACT inhaler Inhale 1-2 puffs into the lungs every 6 (six) hours as needed for wheezing or shortness of breath.    Historical Provider, MD  ibuprofen (ADVIL,MOTRIN) 800 MG tablet Take 1 tablet (800 mg total) by mouth every 8 (eight) hours as needed for mild pain or moderate pain. 07/16/14   Renford DillsLindsey Miller, NP  oxyCODONE-acetaminophen (ROXICET) 5-325 MG per tablet Take 1 tablet by mouth every 8 (eight) hours as needed for moderate pain or severe pain (Do not drive or operate heavy machinery while taking as can cause drowsiness.). 07/16/14   Renford DillsLindsey Miller, NP    Physical Exam: Vitals:   02/20/16 0100 02/20/16 0130 02/20/16 0203 02/20/16 0204  BP: 131/74 (!) 150/95    Pulse: (!) 115 (!) 115  (!) 115  Resp: 19 (!) 22  (!) 28  Temp:      TempSrc:  SpO2: 97% 100% 100% 100%  Weight:      Height:        GENERAL: 36 y.o.-year-old black male patient, well-developed, well-nourished lying in the bed in no acute distress.  Pleasant and cooperative.  BiPap in  place HEENT: Head atraumatic, normocephalic. Pupils equal, round, reactive to light and accommodation. No scleral icterus. Extraocular muscles intact. Nares are patent. Oropharynx is clear. Mucus membranes moist. NECK: Supple, full range of motion. No JVD, no bruit heard. No thyroid enlargement, no tenderness, no cervical lymphadenopathy. CHEST: Good air movement, diffuse wheezing positive use of accessory muscles of respiration.  No reproducible chest wall tenderness.  CARDIOVASCULAR: S1, S2 normal. No murmurs, rubs, or gallops. Cap refill <2 seconds. Pulses intact distally.  ABDOMEN: Soft, nondistended, nontender. No rebound, guarding, rigidity. Normoactive bowel sounds present in all four quadrants. No organomegaly or mass. EXTREMITIES: No pedal edema, cyanosis, or clubbing. No calf tenderness or Homan's sign.  NEUROLOGIC: The patient is alert and oriented x 3. Cranial nerves II through XII are grossly intact with no focal sensorimotor deficit. Muscle strength 5/5 in all extremities. Sensation intact. Gait not checked.     Labs on Admission:  CBC: No results for input(s): WBC, NEUTROABS, HGB, HCT, MCV, PLT in the last 168 hours. Basic Metabolic Panel: No results for input(s): NA, K, CL, CO2, GLUCOSE, BUN, CREATININE, CALCIUM, MG, PHOS in the last 168 hours. GFR: Estimated Creatinine Clearance: 139.5 mL/min (by C-G formula based on SCr of 0.97 mg/dL). Liver Function Tests: No results for input(s): AST, ALT, ALKPHOS, BILITOT, PROT, ALBUMIN in the last 168 hours. No results for input(s): LIPASE, AMYLASE in the last 168 hours. No results for input(s): AMMONIA in the last 168 hours. Coagulation Profile: No results for input(s): INR, PROTIME in the last 168 hours. Cardiac Enzymes: No results for input(s): CKTOTAL, CKMB, CKMBINDEX, TROPONINI in the last 168 hours. BNP (last 3 results) No results for input(s): PROBNP in the last 8760 hours. HbA1C: No results for input(s): HGBA1C in the last  72 hours. CBG: No results for input(s): GLUCAP in the last 168 hours. Lipid Profile: No results for input(s): CHOL, HDL, LDLCALC, TRIG, CHOLHDL, LDLDIRECT in the last 72 hours. Thyroid Function Tests: No results for input(s): TSH, T4TOTAL, FREET4, T3FREE, THYROIDAB in the last 72 hours. Anemia Panel: No results for input(s): VITAMINB12, FOLATE, FERRITIN, TIBC, IRON, RETICCTPCT in the last 72 hours. Urine analysis: No results found for: COLORURINE, APPEARANCEUR, LABSPEC, PHURINE, GLUCOSEU, HGBUR, BILIRUBINUR, KETONESUR, PROTEINUR, UROBILINOGEN, NITRITE, LEUKOCYTESUR Sepsis Labs: @LABRCNTIP (procalcitonin:4,lacticidven:4) )No results found for this or any previous visit (from the past 240 hour(s)).   Radiological Exams on Admission: Dg Chest Portable 1 View  Result Date: 02/20/2016 CLINICAL DATA:  Cough and wheeze x2 days EXAM: PORTABLE CHEST 1 VIEW COMPARISON:  02/03/2016 CXR FINDINGS: The heart size and mediastinal contours are within normal limits. Both lungs are clear. The visualized skeletal structures are unremarkable. IMPRESSION: No active disease. Electronically Signed   By: Tollie Eth M.D.   On: 02/20/2016 01:05    Assessment/Plan  This is Reid 36 y.o. male with Reid history of asthma now being admitted with: 1. Acute exacerbation of asthma  Asthma exacerbation: -Admit to stepdown continuous pulse oximetry. -Continue nebulizers, O2 and tapering steroids -Continue inhaled steroids -Expectorant as needed -Consider pulmonology consult if not improving.   2. Influenza B - Start Tamiflu - Check sputum culture  Admission status: Inpatient, stepdown IV Fluids: NS Diet/Nutrition: NPO while on BiPAP Consults called: Pulm  DVT Px:  SCDs and early ambulation. Code Status: Full Code  Disposition Plan: To home in 1-2 days   All the records are reviewed and case discussed with ED provider. Management plans discussed with the patient and/or family who express understanding and agree  with plan of care.  Darris Staiger D.O. on 02/20/2016 at 2:24 AM Between 7am to 6pm - Pager - [phone] After 6pm go to [email or web address] - [employer] [facility] Office [phone] CC: Primary care physician; No PCP Per Patient   02/20/2016, 2:24 AM

## 2016-02-20 NOTE — ED Notes (Signed)
XR at bedside

## 2016-02-20 NOTE — ED Triage Notes (Signed)
Pt arrived from home with hx of asthma ans SOB upon arrival to ER.  Patient treated en route with 2 duo nebs and 125mg  SoluMedrol.  MD present at triage.

## 2016-02-21 MED ORDER — FLUTICASONE FUROATE-VILANTEROL 200-25 MCG/INH IN AEPB
1.0000 | INHALATION_SPRAY | Freq: Every day | RESPIRATORY_TRACT | Status: DC
  Administered 2016-02-21 – 2016-02-23 (×3): 1 via RESPIRATORY_TRACT
  Filled 2016-02-21: qty 28

## 2016-02-21 MED ORDER — PNEUMOCOCCAL VAC POLYVALENT 25 MCG/0.5ML IJ INJ
0.5000 mL | INJECTION | INTRAMUSCULAR | Status: DC
  Filled 2016-02-21: qty 0.5

## 2016-02-21 MED ORDER — INFLUENZA VAC SPLIT QUAD 0.5 ML IM SUSY
0.5000 mL | PREFILLED_SYRINGE | INTRAMUSCULAR | Status: AC
  Administered 2016-02-22: 0.5 mL via INTRAMUSCULAR
  Filled 2016-02-21: qty 0.5

## 2016-02-21 MED ORDER — ALBUTEROL SULFATE (2.5 MG/3ML) 0.083% IN NEBU
3.0000 mL | INHALATION_SOLUTION | Freq: Four times a day (QID) | RESPIRATORY_TRACT | Status: DC | PRN
  Administered 2016-02-21: 3 mL via RESPIRATORY_TRACT
  Filled 2016-02-21: qty 3

## 2016-02-21 MED ORDER — ALBUTEROL SULFATE (2.5 MG/3ML) 0.083% IN NEBU
2.5000 mg | INHALATION_SOLUTION | Freq: Four times a day (QID) | RESPIRATORY_TRACT | Status: DC | PRN
  Administered 2016-02-21 – 2016-02-22 (×4): 2.5 mg via RESPIRATORY_TRACT
  Filled 2016-02-21 (×4): qty 3

## 2016-02-21 MED ORDER — ALBUTEROL SULFATE HFA 108 (90 BASE) MCG/ACT IN AERS: 2.0000 | INHALATION_SPRAY | Freq: Four times a day (QID) | RESPIRATORY_TRACT | Status: DC | PRN

## 2016-02-21 MED ORDER — METHYLPREDNISOLONE SODIUM SUCC 125 MG IJ SOLR
60.0000 mg | Freq: Three times a day (TID) | INTRAMUSCULAR | Status: DC
  Administered 2016-02-21 – 2016-02-22 (×4): 60 mg via INTRAVENOUS
  Filled 2016-02-21 (×5): qty 2

## 2016-02-21 NOTE — Progress Notes (Signed)
[employer] - [place] [facility]   PATIENT NAME: Justin Reid    MR#:  [id number]  DATE OF BIRTH:  [birth year]  SUBJECTIVE:  Patient off BiPAP. Feeling short of breath and wheezing  REVIEW OF SYSTEMS:    Review of Systems  Constitutional: Positive for malaise/fatigue. Negative for chills and fever.  HENT: Negative.  Negative for ear discharge, ear pain, hearing loss, nosebleeds and sore throat.   Eyes: Negative.  Negative for blurred vision and pain.  Respiratory: Positive for shortness of breath and wheezing. Negative for cough and hemoptysis.   Cardiovascular: Negative.  Negative for chest pain, palpitations and leg swelling.  Gastrointestinal: Negative.  Negative for abdominal pain, blood in stool, diarrhea, nausea and vomiting.  Genitourinary: Negative.  Negative for dysuria.  Musculoskeletal: Negative.  Negative for back pain.  Skin: Negative.   Neurological: Positive for weakness. Negative for dizziness, tremors, speech change, focal weakness, seizures and headaches.  Endo/Heme/Allergies: Negative.  Does not bruise/bleed easily.  Psychiatric/Behavioral: Negative.  Negative for depression, hallucinations and suicidal ideas.    Tolerating Diet: yes      DRUG ALLERGIES:  No Known Allergies  VITALS:  Blood pressure (!) 153/81, pulse 85, temperature 97.7 F (36.5 C), temperature source Axillary, resp. rate 16, height 6' (1.829 m), weight 119.9 kg (264 lb 5.3 oz), SpO2 96 %.  PHYSICAL EXAMINATION:   Physical Exam  Constitutional: He is oriented to person, place, and time and well-developed, well-nourished, and in no distress. No distress.  HENT:  Head: Normocephalic.  Eyes: No scleral icterus.  Neck: Normal range of motion. Neck supple. No JVD present. No tracheal deviation present.  Cardiovascular: Normal rate, regular rhythm and normal heart sounds.  Exam reveals no gallop and no friction rub.   No murmur heard. Pulmonary/Chest: Effort normal.  No respiratory distress. He has wheezes. He has no rales. He exhibits no tenderness.  Good airway entry  Abdominal: Soft. Bowel sounds are normal. He exhibits no distension and no mass. There is no tenderness. There is no rebound and no guarding.  Musculoskeletal: Normal range of motion. He exhibits no edema.  Neurological: He is alert and oriented to person, place, and time.  Skin: Skin is warm. No rash noted. No erythema.  Psychiatric: Affect and judgment normal.      LABORATORY PANEL:   CBC  Recent Labs Lab 02/20/16 0852  WBC 8.0  HGB 11.9*  HCT 34.6*  PLT 336   ------------------------------------------------------------------------------------------------------------------  Chemistries   Recent Labs Lab 02/20/16 0852  NA 135  K 3.8  CL 101  CO2 23  GLUCOSE 164*  BUN 16  CREATININE 1.27*  CALCIUM 8.8*  MG 1.9   ------------------------------------------------------------------------------------------------------------------  Cardiac Enzymes No results for input(s): TROPONINI in the last 168 hours. ------------------------------------------------------------------------------------------------------------------  RADIOLOGY:  Dg Chest Portable 1 View  Result Date: 02/20/2016 CLINICAL DATA:  Cough and wheeze x2 days EXAM: PORTABLE CHEST 1 VIEW COMPARISON:  02/03/2016 CXR FINDINGS: The heart size and mediastinal contours are within normal limits. Both lungs are clear. The visualized skeletal structures are unremarkable. IMPRESSION: No active disease. Electronically Signed   By: Tollie Ethavid  Kwon M.D.   On: 02/20/2016 01:05     ASSESSMENT AND PLAN:    36 year old male with history of asthma who presents with acute exacerbation of asthma.  1. Acute hypoxic respiratory failure due to Acute asthma exacerbation: Patient will need to continue BiPAP. Continue to wean steroids and nebulizers.  He will need On acting beta agonist with inhaled  corticosteroid and short  acting rescue albuterol at discharge. Appreciate pulmonary consultation   2. Influenza B: Continue Tamiflu for total 5 days     Management plans discussed with the patient and he is in agreement.  CODE STATUS: FULL  TOTAL TIME TAKING CARE OF THIS PATIENT: 24 minutes.     POSSIBLE D/C 2-3 days, DEPENDING ON CLINICAL CONDITION.   Nylani Michetti M.D on 02/21/2016 at 12:31 PM  Between 7am to 6pm - Pager - [phone] After 6pm go to [email or web address] - Social research officer, government  [facility]  Office  [phone]  CC: Primary care physician; No PCP Per Patient  Note: This dictation was prepared with Dragon dictation along with smaller phrase technology. Any transcriptional errors that result from this process are unintentional.

## 2016-02-21 NOTE — Progress Notes (Signed)
Per Dr Lonn Georgiaonforti DC albuterol BRTX and start PRN albuterol inhaler for patient  Per Dr Juliene PinaMody it is ok to stop IV fluids

## 2016-02-21 NOTE — Progress Notes (Signed)
Patient prefers to keep 2 liters Justin Reid on at all times, unable to wean him this shift.  Administered 2 Albuterol breathing tx.  Pt with pronounced wheezing this AM, resolved by lunchtime.  He still reports SOB with exertion.  [facility].Started Breo inhaler this shift.  Fluids stopped, UOP WDL, had BM.  Intake excellent.  No other issues at this time other than SOB and NP cough, and sore throat and chest.  Pain controlled with oxycodone.

## 2016-02-21 NOTE — Progress Notes (Signed)
Dr Juliene PinaMody prefers to keep patient on ICU pt resp still tight, pronounced wheezes.  We may transfer him to any med surg w/o telemetry if there is an ICU bed crunch.  Writer informed patient that we cannot get an albuterol inhaler for him in the hospital per the pharmacy.  We can only administer albuterol BRTX.  He verbalized understanding and stated the BRTX does help somewhat.

## 2016-02-21 NOTE — Progress Notes (Signed)
[facility] Critical Care Medicine Consultation     ASSESSMENT/PLAN   Patient with chronic persistent asthma with recurrent exacerbation. Influenza B positive presently on Tamiflu. Continue Solu-Medrol, albuterol, Atrovent, will add inhaled corticosteroid as we wean down on the Solu-Medrol. Will start on long-acting beta agonist, inhaled corticosteroid with short-acting rescue albuterol. Complete a 5 day course of Tamiflu. Would continue on Solu-Medrol another day and considering changing to prednisone tomorrow. Clinically significantly improved may be transferred to the floor  Name: Justin Reid MRN: [id number] DOB: 08-10-1980    ADMISSION DATE:  02/20/2016 CONSULTATION DATE:  02/20/2016  REFERRING MD :  Dr. Juliene Pina  Subjective: Last 24 hours patient has significantly improved. Has been weaned off of noninvasive ventilation presently on 2 L nasal cannula. Still with significant wheezing but states he is feeling much better.Marland Kitchen  PAST MEDICAL HISTORY :  Past Medical History:  Diagnosis Date  . Asthma    History reviewed. No pertinent surgical history. Prior to Admission medications   Medication Sig Start Date End Date Taking? Authorizing Provider  albuterol (PROVENTIL HFA;VENTOLIN HFA) 108 (90 Base) MCG/ACT inhaler Inhale 2-4 puffs by mouth every 4 hours as needed for wheezing, cough, and/or shortness of breath 02/03/16  Yes Loleta Rose, MD   No Known Allergies  FAMILY HISTORY:  No family history on file. SOCIAL HISTORY:  reports that he has been smoking.  He has never used smokeless tobacco. He reports that he drinks alcohol. His drug history is not on file.  REVIEW OF SYSTEMS:   Constitutional: Feels well. Cardiovascular: No chest pain.  Pulmonary: Denies dyspnea.   The remainder of systems were reviewed and were found to be negative other than what is documented in the HPI.    VITAL SIGNS: Temp:  [97.5 F (36.4 C)-98 F (36.7 C)] 98 F (36.7 C) (02/17 0100) Pulse Rate:   [69-97] 81 (02/17 0600) Resp:  [12-19] 14 (02/17 0600) BP: (117-157)/(65-104) 127/78 (02/17 0600) SpO2:  [93 %-99 %] 96 % (02/17 0600) HEMODYNAMICS:   VENTILATOR SETTINGS:   INTAKE / OUTPUT:  Intake/Output Summary (Last 24 hours) at 02/21/16 0802 Last data filed at 02/21/16 0600  Gross per 24 hour  Intake          2031.75 ml  Output              925 ml  Net          1106.75 ml    Physical Examination:   VS: BP 127/78   Pulse 81   Temp 98 F (36.7 C) (Oral)   Resp 14   Ht 6' (1.829 m)   Wt 119.9 kg (264 lb 5.3 oz)   SpO2 96%   BMI 35.85 kg/m   General Appearance: Awake, alert, oriented, in mild respiratory distress on noninvasive ventilation  Neuro:without focal findings, mental status, speech normal,. HEENT: PERRLA, EOM intact, no ptosis, no other lesions noticed;  Pulmonary: Prolonged expiratory phase, diffuse expiratory wheezing appreciated with central congestion CardiovascularNormal S1,S2.  No m/r/g.    Abdomen: Benign, Soft, non-tender, No masses, hepatosplenomegaly, No lymphadenopathy Renal:  No costovertebral tenderness  GU:  Not performed at this time. Endoc: No evident thyromegaly, no signs of acromegaly. Skin:   warm, no rashes, no ecchymosis  Extremities: normal, no cyanosis, clubbing, no edema, warm with normal capillary refill.    LABS: Reviewed   LABORATORY PANEL:   CBC  Recent Labs Lab 02/20/16 0852  WBC 8.0  HGB 11.9*  HCT 34.6*  PLT 336  Chemistries   Recent Labs Lab 02/20/16 0852  NA 135  K 3.8  CL 101  CO2 23  GLUCOSE 164*  BUN 16  CREATININE 1.27*  CALCIUM 8.8*  MG 1.9  PHOS 3.2     Recent Labs Lab 02/20/16 0633 02/20/16 2148  GLUCAP 179* 144*    Recent Labs Lab 02/20/16 0203  PHART 7.61*  PCO2ART 23*  PO2ART 167*   No results for input(s): AST, ALT, ALKPHOS, BILITOT, ALBUMIN in the last 168 hours.  Cardiac Enzymes No results for input(s): TROPONINI in the last 168 hours.  RADIOLOGY:  Dg Chest  Portable 1 View  Result Date: 02/20/2016 CLINICAL DATA:  Cough and wheeze x2 days EXAM: PORTABLE CHEST 1 VIEW COMPARISON:  02/03/2016 CXR FINDINGS: The heart size and mediastinal contours are within normal limits. Both lungs are clear. The visualized skeletal structures are unremarkable. IMPRESSION: No active disease. Electronically Signed   By: Tollie Ethavid  Kwon M.D.   On: 02/20/2016 01:05     Tora KindredJohn Dolton Shaker, DO [facility] Office Number: [phone]  Santiago Gladavid Kasa, M.D.  Stephanie AcreVishal Mungal, M.D.  Billy Fischeravid Simonds, M.D   02/21/2016, 8:02 AM

## 2016-02-22 LAB — POTASSIUM: Potassium: 3.7 mmol/L (ref 3.5–5.1)

## 2016-02-22 LAB — BASIC METABOLIC PANEL
ANION GAP: 7 (ref 5–15)
BUN: 16 mg/dL (ref 6–20)
CO2: 28 mmol/L (ref 22–32)
Calcium: 9 mg/dL (ref 8.9–10.3)
Chloride: 101 mmol/L (ref 101–111)
Creatinine, Ser: 0.92 mg/dL (ref 0.61–1.24)
Glucose, Bld: 132 mg/dL — ABNORMAL HIGH (ref 65–99)
POTASSIUM: 5.2 mmol/L — AB (ref 3.5–5.1)
Sodium: 136 mmol/L (ref 135–145)

## 2016-02-22 LAB — MAGNESIUM: MAGNESIUM: 2.6 mg/dL — AB (ref 1.7–2.4)

## 2016-02-22 LAB — PHOSPHORUS: PHOSPHORUS: 4.1 mg/dL (ref 2.5–4.6)

## 2016-02-22 MED ORDER — SODIUM POLYSTYRENE SULFONATE 15 GM/60ML PO SUSP
30.0000 g | Freq: Once | ORAL | Status: AC
  Administered 2016-02-22: 30 g via ORAL
  Filled 2016-02-22: qty 120

## 2016-02-22 MED ORDER — IPRATROPIUM-ALBUTEROL 0.5-2.5 (3) MG/3ML IN SOLN
3.0000 mL | Freq: Four times a day (QID) | RESPIRATORY_TRACT | Status: DC
  Administered 2016-02-22 – 2016-02-23 (×3): 3 mL via RESPIRATORY_TRACT
  Filled 2016-02-22 (×3): qty 3

## 2016-02-22 MED ORDER — PREDNISONE 20 MG PO TABS
40.0000 mg | ORAL_TABLET | Freq: Every day | ORAL | Status: DC
Start: 2016-02-23 — End: 2016-02-23
  Administered 2016-02-23: 40 mg via ORAL
  Filled 2016-02-22: qty 2

## 2016-02-22 MED ORDER — PNEUMOCOCCAL VAC POLYVALENT 25 MCG/0.5ML IJ INJ: 0.5000 mL | INJECTION | INTRAMUSCULAR | Status: DC

## 2016-02-22 NOTE — Progress Notes (Signed)
[facility], pt is now off supp O2 per PT.  He is istting up in his chair w no s/sx distress.  Chart and belongings will be transported with him

## 2016-02-22 NOTE — Progress Notes (Addendum)
[facility] Consult[facility]     ASSESSMENT/PLAN   Patient with chronic persistent asthma with recurrent exacerbation. Influenza B positive presently on Tamiflu. On Solu-Medrol, albuterol, Atrovent, Breo added yesterday. Complete a 5 day course of Tamiflu. Wheezing has significantly improved today. Consider changing to prednisone with slow taper. Would discharge patient on Breo with rescue short-acting beta agonist. Would recommend pulmonary clinic outpatient follow-up. No other additional recommendations we'll sign off. Please reconsult if can be of any assistance.  Name: Justin Reid MRN: [id number] DOB: 11-26-1980    ADMISSION DATE:  02/20/2016 CONSULTATION DATE:  02/20/2016  REFERRING MD :  Dr. Juliene PinaMody  Subjective: Last 24 hours patient has significantly improved.  PAST MEDICAL HISTORY :  Past Medical History:  Diagnosis Date  . Asthma    History reviewed. No pertinent surgical history. Prior to Admission medications   Medication Sig Start Date End Date Taking? Authorizing Provider  albuterol (PROVENTIL HFA;VENTOLIN HFA) 108 (90 Base) MCG/ACT inhaler Inhale 2-4 puffs by mouth every 4 hours as needed for wheezing, cough, and/or shortness of breath 02/03/16  Yes Loleta Roseory Forbach, MD   No Known Allergies  FAMILY HISTORY:  No family history on file. SOCIAL HISTORY:  reports that he has been smoking.  He has never used smokeless tobacco. He reports that he drinks alcohol. His drug history is not on file.  REVIEW OF SYSTEMS:   Constitutional: Feels well. Cardiovascular: No chest pain.  Pulmonary: Denies dyspnea.   The remainder of systems were reviewed and were found to be negative other than what is documented in the HPI.    VITAL SIGNS: Temp:  [97.6 F (36.4 C)-98.4 F (36.9 C)] 98.4 F (36.9 C) (02/18 0000) Pulse Rate:  [63-98] 69 (02/18 0700) Resp:  [13-21] 18 (02/18 0700) BP: (97-153)/(62-98) 130/83 (02/18 0700) SpO2:  [94 %-98 %] 97 % (02/18  0700) HEMODYNAMICS:   VENTILATOR SETTINGS:   INTAKE / OUTPUT:  Intake/Output Summary (Last 24 hours) at 02/22/16 0809 Last data filed at 02/22/16 0500  Gross per 24 hour  Intake              940 ml  Output              800 ml  Net              140 ml    Physical Examination:   VS: BP 130/83 (BP Location: Right Arm)   Pulse 69   Temp 98.4 F (36.9 C) (Oral)   Resp 18   Ht 6' (1.829 m)   Wt 119.9 kg (264 lb 5.3 oz)   SpO2 97%   BMI 35.85 kg/m   General Appearance: Awake, alert, oriented, in mild respiratory distress on noninvasive ventilation  Neuro:without focal findings, mental status, speech normal,. HEENT: PERRLA, EOM intact, no ptosis, no other lesions noticed;  Pulmonary: Wheezing significantly improved CardiovascularNormal S1,S2.  No m/r/g.    Abdomen: Benign, Soft, non-tender, No masses, hepatosplenomegaly, No lymphadenopathy Renal:  No costovertebral tenderness  GU:  Not performed at this time. Endoc: No evident thyromegaly, no signs of acromegaly. Skin:   warm, no rashes, no ecchymosis  Extremities: normal, no cyanosis, clubbing, no edema, warm with normal capillary refill.    LABS: Reviewed   LABORATORY PANEL:   CBC  Recent Labs Lab 02/20/16 0852  WBC 8.0  HGB 11.9*  HCT 34.6*  PLT 336    Chemistries   Recent Labs Lab 02/22/16 0428  NA 136  K 5.2*  CL 101  CO2 28  GLUCOSE 132*  BUN 16  CREATININE 0.92  CALCIUM 9.0  MG 2.6*  PHOS 4.1     Recent Labs Lab 02/20/16 0633 02/20/16 2148  GLUCAP 179* 144*    Recent Labs Lab 02/20/16 0203  PHART 7.61*  PCO2ART 23*  PO2ART 167*   No results for input(s): AST, ALT, ALKPHOS, BILITOT, ALBUMIN in the last 168 hours.  Cardiac Enzymes No results for input(s): TROPONINI in the last 168 hours.  RADIOLOGY:  No results found.   Tora Kindred, DO [facility] Office Number: [phone]  Santiago Glad, M.D.  Stephanie Acre, M.D.  Billy Fischer,  M.D   02/22/2016, 8:09 AM

## 2016-02-22 NOTE — Evaluation (Signed)
Physical Therapy Evaluation Patient Details Name: Justin Reid MRN: [id number] DOB: 10/27/1980 Today's Date: 02/22/2016   History of Present Illness  presented to ER secondary to worsening SOB; admitted with acute hypoxic respiratory failure secondary to asthma exacerbation, influenza B.  Initially requiring BiPAP, now weaned to 2L supplemental O2 via [place].  Per RN, planning to wean to RA as tolerated.  Clinical Impression  Upon evaluation, patient alert and oriented; follows all commands and demonstrates good insight/safety awareness.  Bilat UE/LE strength and ROM grossly symmetrical and WFL.  Able to complete bed mobility, sit/stand, basic transfers and gait (300') without assist device, mod indep/indep.  Maintains sats >93% on RA without difficulty, minimal SOB noted with exertional activity.  RN informed/aware; patient left on RA end of session. Patient currently indep with all functional activity; no acute PT needs identified.  Did encourage continued mobilization around nursing station (with mask donned) throughout remaining hospitalization for maintenance of strength and cardiopulmonary endurance.  Patient voiced understanding. Will complete initial order at this time; please re-consult should needs change.     Follow Up Recommendations No PT follow up    Equipment Recommendations       Recommendations for Other Services       Precautions / Restrictions Precautions Precautions: Fall Precaution Comments: droplet iso      Mobility  Bed Mobility Overal bed mobility: Independent                Transfers Overall transfer level: Independent                  Ambulation/Gait Ambulation/Gait assistance: Modified independent (Device/Increase time) Ambulation Distance (Feet): 300 Feet Assistive device: None       General Gait Details: reciprocal stepping pattern with fair cadence and gait speed (slightly decreased); able to copmlete head turns, changes of  direction and obstacle negotiation without difficulty or safety concern. Maintains sats >93% on RA with exertion, minimal SOB noted.l  Stairs            Wheelchair Mobility    Modified Rankin (Stroke Patients Only)       Balance Overall balance assessment: Independent                                           Pertinent Vitals/Pain Pain Assessment: No/denies pain    Home Living Family/patient expects to be discharged to:: Private residence Living Arrangements: Spouse/significant other Available Help at Discharge: Family Type of Home: House Home Access: Stairs to enter Entrance Stairs-Rails: Right Entrance Stairs-Number of Steps: 4 Home Layout: One level        Prior Function Level of Independence: Independent         Comments: Indep with ADLs, household and community mobility; no home O2     Hand Dominance        Extremity/Trunk Assessment   Upper Extremity Assessment Upper Extremity Assessment: Overall WFL for tasks assessed    Lower Extremity Assessment Lower Extremity Assessment: Overall WFL for tasks assessed       Communication   Communication: No difficulties  Cognition Arousal/Alertness: Awake/alert Behavior During Therapy: WFL for tasks assessed/performed Overall Cognitive Status: Within Functional Limits for tasks assessed                      General Comments      Exercises  Assessment/Plan    PT Assessment Patent does not need any further PT services  PT Problem List            PT Treatment Interventions      PT Goals (Current goals can be found in the Care Plan section)  Acute Rehab PT Goals Patient Stated Goal: to feel better in my chest (breathing) PT Goal Formulation: All assessment and education complete, DC therapy    Frequency     Barriers to discharge        Co-evaluation               End of Session Equipment Utilized During Treatment: Gait belt Activity Tolerance:  Patient tolerated treatment well Patient left: in chair;with call bell/phone within reach;with family/visitor present Nurse Communication: Mobility status         Time: 5409-8119 PT Time Calculation (min) (ACUTE ONLY): 12 min   Charges:   PT Evaluation $PT Eval Low Complexity: 1 Procedure     PT G Codes:        [name], PT, DPT, NCS 02/22/16, 10:54 AM [phone]

## 2016-02-23 ENCOUNTER — Telehealth: Payer: Self-pay

## 2016-02-23 MED ORDER — OSELTAMIVIR PHOSPHATE 75 MG PO CAPS: 75.0000 mg | ORAL_CAPSULE | Freq: Two times a day (BID) | ORAL | 0 refills | Status: AC

## 2016-02-23 MED ORDER — ALBUTEROL SULFATE HFA 108 (90 BASE) MCG/ACT IN AERS: INHALATION_SPRAY | RESPIRATORY_TRACT | 1 refills | Status: DC

## 2016-02-23 MED ORDER — PREDNISONE 20 MG PO TABS: 40.0000 mg | ORAL_TABLET | Freq: Every day | ORAL | 0 refills | Status: AC

## 2016-02-23 MED ORDER — FLUTICASONE FUROATE-VILANTEROL 200-25 MCG/INH IN AEPB: 1.0000 | INHALATION_SPRAY | Freq: Every day | RESPIRATORY_TRACT | 0 refills | Status: AC

## 2016-02-23 NOTE — Telephone Encounter (Signed)
[facility]  He was needing a hospital fu with us in about 2 weeks Please call back and let them know when patient can be seen.  Pt is still in hospital and is being discharged today.

## 2016-02-23 NOTE — Telephone Encounter (Signed)
Schedule pt for 03-09-16 @ 12pm with Dr. Belia HemanKasa and inform pt to be here at 11:45am. Thanks.

## 2016-02-23 NOTE — Discharge Summary (Signed)
[facility] - [place] at [facility]   PATIENT NAME: Justin NorthSteven Helmstetter    MR#:  [id number]  DATE OF BIRTH:  03-17-1980  DATE OF ADMISSION:  02/20/2016 ADMITTING PHYSICIAN: Tonye RoyaltyAlexis Hugelmeyer, DO  DATE OF DISCHARGE:  02/23/2016 PRIMARY CARE PHYSICIAN: No PCP Per Patient    ADMISSION DIAGNOSIS:  Influenza B [J10.1] Severe persistent asthma with exacerbation [J45.51]  DISCHARGE DIAGNOSIS:  Active Problems:   Asthma exacerbation   SECONDARY DIAGNOSIS:   Past Medical History:  Diagnosis Date  . Asthma     HOSPITAL COURSE:   36 year old male with history of asthma who presents with acute exacerbation of asthma.  1. Acute hypoxic respiratory failure due to AcuteOn chronic persistent asthma exacerbation:Patient was initially admitted to the stepdown unit on BiPAP. He was started on IV steroids in addition to therapy for influenza B. His symptoms have vastly improved since admission. On examination at discharge he lungs are clear to also patient without wheezing. He will need long acting beta agonist with inhaled corticosteroid and short acting rescue albuterol, as well as prednisone 40 mg for 3 days. He will follow-up with pulmonary in 2 weeks. n   2. Influenza B: Continue Tamiflu for total 5 days   DISCHARGE CONDITIONS AND DIET:  Stable Regular diet  CONSULTS OBTAINED:  Treatment Team:  Tora KindredJohn Conforti, DO  DRUG ALLERGIES:  No Known Allergies  DISCHARGE MEDICATIONS:   Current Discharge Medication List    START taking these medications   Details  fluticasone furoate-vilanterol (BREO ELLIPTA) 200-25 MCG/INH AEPB Inhale 1 puff into the lungs daily. Qty: 28 each, Refills: 0    oseltamivir (TAMIFLU) 75 MG capsule Take 1 capsule (75 mg total) by mouth 2 (two) times daily. Qty: 3 capsule, Refills: 0    predniSONE (DELTASONE) 20 MG tablet Take 2 tablets (40 mg total) by mouth daily at 6 (six) AM. Qty: 4 tablet, Refills: 0      CONTINUE these medications  which have NOT CHANGED   Details  albuterol (PROVENTIL HFA;VENTOLIN HFA) 108 (90 Base) MCG/ACT inhaler Inhale 2-4 puffs by mouth every 4 hours as needed for wheezing, cough, and/or shortness of breath Qty: 1 Inhaler, Refills: 1          Today   CHIEF COMPLAINT:  Doing well no sob   VITAL SIGNS:  Blood pressure (!) 146/91, pulse 68, temperature 98.2 F (36.8 C), temperature source Oral, resp. rate 20, height 6' (1.829 m), weight 119.9 kg (264 lb 5.3 oz), SpO2 96 %.   REVIEW OF SYSTEMS:  Review of Systems  Constitutional: Negative.  Negative for chills, fever and malaise/fatigue.  HENT: Negative.  Negative for ear discharge, ear pain, hearing loss, nosebleeds and sore throat.   Eyes: Negative.  Negative for blurred vision and pain.  Respiratory: Negative.  Negative for cough, hemoptysis, shortness of breath and wheezing.   Cardiovascular: Negative.  Negative for chest pain, palpitations and leg swelling.  Gastrointestinal: Negative.  Negative for abdominal pain, blood in stool, diarrhea, nausea and vomiting.  Genitourinary: Negative.  Negative for dysuria.  Musculoskeletal: Negative.  Negative for back pain.  Skin: Negative.   Neurological: Negative for dizziness, tremors, speech change, focal weakness, seizures and headaches.  Endo/Heme/Allergies: Negative.  Does not bruise/bleed easily.  Psychiatric/Behavioral: Negative.  Negative for depression, hallucinations and suicidal ideas.     PHYSICAL EXAMINATION:  GENERAL:  36 y.o.-year-old patient lying in the bed with no acute distress.  NECK:  Supple, no jugular venous distention. No thyroid enlargement, no  tenderness.  LUNGS: Normal breath sounds bilaterally, no wheezing, rales,rhonchi  No use of accessory muscles of respiration.  CARDIOVASCULAR: S1, S2 normal. No murmurs, rubs, or gallops.  ABDOMEN: Soft, non-tender, non-distended. Bowel sounds present. No organomegaly or mass.  EXTREMITIES: No pedal edema, cyanosis, or  clubbing.  PSYCHIATRIC: The patient is alert and oriented x 3.  SKIN: No obvious rash, lesion, or ulcer.   DATA REVIEW:   CBC  Recent Labs Lab 02/20/16 0852  WBC 8.0  HGB 11.9*  HCT 34.6*  PLT 336    Chemistries   Recent Labs Lab 02/22/16 0428 02/22/16 1159  NA 136  --   K 5.2* 3.7  CL 101  --   CO2 28  --   GLUCOSE 132*  --   BUN 16  --   CREATININE 0.92  --   CALCIUM 9.0  --   MG 2.6*  --     Cardiac Enzymes No results for input(s): TROPONINI in the last 168 hours.  Microbiology Results  @MICRORSLT48 @  RADIOLOGY:  No results found.    Current Discharge Medication List    START taking these medications   Details  fluticasone furoate-vilanterol (BREO ELLIPTA) 200-25 MCG/INH AEPB Inhale 1 puff into the lungs daily. Qty: 28 each, Refills: 0    oseltamivir (TAMIFLU) 75 MG capsule Take 1 capsule (75 mg total) by mouth 2 (two) times daily. Qty: 3 capsule, Refills: 0    predniSONE (DELTASONE) 20 MG tablet Take 2 tablets (40 mg total) by mouth daily at 6 (six) AM. Qty: 4 tablet, Refills: 0      CONTINUE these medications which have NOT CHANGED   Details  albuterol (PROVENTIL HFA;VENTOLIN HFA) 108 (90 Base) MCG/ACT inhaler Inhale 2-4 puffs by mouth every 4 hours as needed for wheezing, cough, and/or shortness of breath Qty: 1 Inhaler, Refills: 1          Management plans discussed with the patient and he is in agreement. Stable for discharge home  Patient should follow up with pulmonary  CODE STATUS:     Code Status Orders        Start     Ordered   02/20/16 0633  Full code  Continuous     02/20/16 1610    Code Status History    Date Active Date Inactive Code Status Order ID Comments User Context   This patient has a current code status but no historical code status.      TOTAL TIME TAKING CARE OF THIS PATIENT: 37 minutes.    Note: This dictation was prepared with Dragon dictation along with smaller phrase technology. Any  transcriptional errors that result from this process are unintentional.  Lyric Rossano M.D on 02/23/2016 at 8:01 AM  Between 7am to 6pm - Pager - [phone] After 6pm go to [email or web address] - Social research officer, government  [facility]  Office  [phone]  CC: Primary care physician; No PCP Per Patient

## 2016-02-23 NOTE — Progress Notes (Signed)
DISCHARGE NOTE:  Pt given discharge instructions and prescriptions. Pt verbalized understanding.  

## 2016-02-23 NOTE — Telephone Encounter (Signed)
Done

## 2016-02-23 NOTE — Care Management (Addendum)
Spoke with patient as he is walking out. He has a medication management application filled out. Instructed him as to where to take it and the need for follow up with [facility]. [facility]. He verbalized understanding.

## 2016-03-09 ENCOUNTER — Inpatient Hospital Stay: Payer: Self-pay | Admitting: Internal Medicine

## 2016-03-09 ENCOUNTER — Encounter: Payer: Self-pay | Admitting: *Deleted

## 2016-03-29 ENCOUNTER — Ambulatory Visit: Payer: Self-pay | Admitting: Pharmacy Technician

## 2016-03-29 NOTE — Progress Notes (Signed)
Patient scheduled for eligibility appointment at [facility].  Patient did not show for the appointment on 03/29/16 at 3:30p.m.  Patient did not reschedule eligibility appointment.  [facility] will be unable to provide additional medication assistance until eligibility is determined.  Justin DacostaBetty J. Lillan Reid Care Manager [facility]

## 2016-07-06 ENCOUNTER — Emergency Department: Payer: Self-pay

## 2016-07-06 ENCOUNTER — Observation Stay
Admission: EM | Admit: 2016-07-06 | Discharge: 2016-07-08 | Disposition: A | Discharge status: Home / Self Care | Payer: Self-pay | Attending: Specialist | Admitting: Specialist

## 2016-07-06 ENCOUNTER — Encounter: Payer: Self-pay | Admitting: Emergency Medicine

## 2016-07-06 DIAGNOSIS — F17210 Nicotine dependence, cigarettes, uncomplicated: Secondary | ICD-10-CM | POA: Insufficient documentation

## 2016-07-06 DIAGNOSIS — Z6836 Body mass index (BMI) 36.0-36.9, adult: Secondary | ICD-10-CM | POA: Insufficient documentation

## 2016-07-06 DIAGNOSIS — J4551 Severe persistent asthma with (acute) exacerbation: Secondary | ICD-10-CM | POA: Diagnosis present

## 2016-07-06 DIAGNOSIS — K920 Hematemesis: Secondary | ICD-10-CM | POA: Insufficient documentation

## 2016-07-06 DIAGNOSIS — Z79899 Other long term (current) drug therapy: Secondary | ICD-10-CM | POA: Insufficient documentation

## 2016-07-06 DIAGNOSIS — K921 Melena: Secondary | ICD-10-CM | POA: Insufficient documentation

## 2016-07-06 DIAGNOSIS — J45901 Unspecified asthma with (acute) exacerbation: Secondary | ICD-10-CM | POA: Diagnosis present

## 2016-07-06 DIAGNOSIS — J45909 Unspecified asthma, uncomplicated: Secondary | ICD-10-CM | POA: Diagnosis present

## 2016-07-06 DIAGNOSIS — J4541 Moderate persistent asthma with (acute) exacerbation: Principal | ICD-10-CM | POA: Insufficient documentation

## 2016-07-06 LAB — MAGNESIUM: Magnesium: 2.1 mg/dL (ref 1.7–2.4)

## 2016-07-06 LAB — CBC WITH DIFFERENTIAL/PLATELET
Basophils Absolute: 0.1 10*3/uL (ref 0–0.1)
Basophils Relative: 1 %
EOS ABS: 0.1 10*3/uL (ref 0–0.7)
EOS PCT: 1 %
HCT: 38.8 % — ABNORMAL LOW (ref 40.0–52.0)
Hemoglobin: 12.8 g/dL — ABNORMAL LOW (ref 13.0–18.0)
LYMPHS ABS: 1.2 10*3/uL (ref 1.0–3.6)
LYMPHS PCT: 16 %
MCH: 26.9 pg (ref 26.0–34.0)
MCHC: 33 g/dL (ref 32.0–36.0)
MCV: 81.3 fL (ref 80.0–100.0)
MONO ABS: 0.2 10*3/uL (ref 0.2–1.0)
Monocytes Relative: 2 %
Neutro Abs: 6 10*3/uL (ref 1.4–6.5)
Neutrophils Relative %: 80 %
PLATELETS: 389 10*3/uL (ref 150–440)
RBC: 4.77 MIL/uL (ref 4.40–5.90)
RDW: 13.8 % (ref 11.5–14.5)
WBC: 7.5 10*3/uL (ref 3.8–10.6)

## 2016-07-06 LAB — BASIC METABOLIC PANEL
Anion gap: 9 (ref 5–15)
BUN: 14 mg/dL (ref 6–20)
CHLORIDE: 102 mmol/L (ref 101–111)
CO2: 24 mmol/L (ref 22–32)
CREATININE: 1.16 mg/dL (ref 0.61–1.24)
Calcium: 9 mg/dL (ref 8.9–10.3)
GFR calc Af Amer: >60 mL/min (ref >60)
GFR calc non Af Amer: >60 mL/min (ref >60)
GLUCOSE: 113 mg/dL — AB (ref 65–99)
Potassium: 3.9 mmol/L (ref 3.5–5.1)
Sodium: 135 mmol/L (ref 135–145)

## 2016-07-06 LAB — TSH: TSH: 0.784 u[IU]/mL (ref 0.350–4.500)

## 2016-07-06 MED ORDER — ACETAMINOPHEN 650 MG RE SUPP: 650.0000 mg | Freq: Four times a day (QID) | RECTAL | Status: DC | PRN

## 2016-07-06 MED ORDER — SODIUM CHLORIDE 0.9 % IV BOLUS (SEPSIS)
1000.0000 mL | Freq: Once | INTRAVENOUS | Status: AC
  Administered 2016-07-06: 1000 mL via INTRAVENOUS

## 2016-07-06 MED ORDER — ACETAMINOPHEN 325 MG PO TABS
650.0000 mg | ORAL_TABLET | Freq: Four times a day (QID) | ORAL | Status: DC | PRN
  Administered 2016-07-06: 650 mg via ORAL
  Filled 2016-07-06: qty 2

## 2016-07-06 MED ORDER — ALBUTEROL SULFATE (2.5 MG/3ML) 0.083% IN NEBU: 2.5000 mg | INHALATION_SOLUTION | RESPIRATORY_TRACT | Status: DC | PRN

## 2016-07-06 MED ORDER — SODIUM CHLORIDE 0.9% FLUSH
3.0000 mL | Freq: Two times a day (BID) | INTRAVENOUS | Status: DC
  Administered 2016-07-06 – 2016-07-08 (×4): 3 mL via INTRAVENOUS

## 2016-07-06 MED ORDER — HYDROCODONE-ACETAMINOPHEN 5-325 MG PO TABS
1.0000 | ORAL_TABLET | ORAL | Status: DC | PRN
  Administered 2016-07-07: 2 via ORAL
  Administered 2016-07-07: 01:00:00 1 via ORAL
  Administered 2016-07-07: 2 via ORAL
  Filled 2016-07-06 (×3): qty 1
  Filled 2016-07-06: qty 2

## 2016-07-06 MED ORDER — SENNOSIDES-DOCUSATE SODIUM 8.6-50 MG PO TABS: 1.0000 | ORAL_TABLET | Freq: Every evening | ORAL | Status: DC | PRN

## 2016-07-06 MED ORDER — GUAIFENESIN 100 MG/5ML PO SOLN
5.0000 mL | ORAL | Status: DC | PRN
  Administered 2016-07-07 (×2): 100 mg via ORAL
  Filled 2016-07-06 (×3): qty 5

## 2016-07-06 MED ORDER — SODIUM CHLORIDE 0.9 % IV SOLN: 250.0000 mL | INTRAVENOUS | Status: DC | PRN

## 2016-07-06 MED ORDER — ALBUTEROL SULFATE (2.5 MG/3ML) 0.083% IN NEBU
5.0000 mg | INHALATION_SOLUTION | Freq: Once | RESPIRATORY_TRACT | Status: AC
  Administered 2016-07-06: 5 mg via RESPIRATORY_TRACT
  Filled 2016-07-06: qty 6

## 2016-07-06 MED ORDER — ONDANSETRON HCL 4 MG PO TABS: 4.0000 mg | ORAL_TABLET | Freq: Four times a day (QID) | ORAL | Status: DC | PRN

## 2016-07-06 MED ORDER — PANTOPRAZOLE SODIUM 40 MG PO TBEC
40.0000 mg | DELAYED_RELEASE_TABLET | Freq: Two times a day (BID) | ORAL | Status: DC
  Administered 2016-07-06 – 2016-07-08 (×4): 40 mg via ORAL
  Filled 2016-07-06 (×4): qty 1

## 2016-07-06 MED ORDER — IPRATROPIUM-ALBUTEROL 0.5-2.5 (3) MG/3ML IN SOLN
3.0000 mL | Freq: Once | RESPIRATORY_TRACT | Status: AC
  Administered 2016-07-06: 3 mL via RESPIRATORY_TRACT
  Filled 2016-07-06: qty 3

## 2016-07-06 MED ORDER — IPRATROPIUM-ALBUTEROL 0.5-2.5 (3) MG/3ML IN SOLN
3.0000 mL | Freq: Four times a day (QID) | RESPIRATORY_TRACT | Status: DC
  Administered 2016-07-07 (×2): 3 mL via RESPIRATORY_TRACT
  Filled 2016-07-06 (×2): qty 3

## 2016-07-06 MED ORDER — ONDANSETRON HCL 4 MG/2ML IJ SOLN: 4.0000 mg | Freq: Four times a day (QID) | INTRAMUSCULAR | Status: DC | PRN

## 2016-07-06 MED ORDER — ENOXAPARIN SODIUM 40 MG/0.4ML ~~LOC~~ SOLN
40.0000 mg | SUBCUTANEOUS | Status: DC
  Administered 2016-07-06: 22:00:00 40 mg via SUBCUTANEOUS
  Filled 2016-07-06: qty 0.4

## 2016-07-06 MED ORDER — METHYLPREDNISOLONE SODIUM SUCC 125 MG IJ SOLR
60.0000 mg | Freq: Two times a day (BID) | INTRAMUSCULAR | Status: DC
  Administered 2016-07-06 – 2016-07-07 (×2): 60 mg via INTRAVENOUS
  Filled 2016-07-06 (×2): qty 2

## 2016-07-06 MED ORDER — SODIUM CHLORIDE 0.9% FLUSH: 3.0000 mL | INTRAVENOUS | Status: DC | PRN

## 2016-07-06 MED ORDER — BENZONATATE 100 MG PO CAPS: 100.0000 mg | ORAL_CAPSULE | Freq: Three times a day (TID) | ORAL | Status: DC | PRN

## 2016-07-06 NOTE — ED Provider Notes (Addendum)
[facility] Emergency Department Provider Note  ____________________________________________   I have reviewed the triage vital signs and the nursing notes.   HISTORY  Chief Complaint Shortness of Breath    HPI Justin Reid is a 36 y.o. male who presents today complaining of asthma. He has a history of asthma. He also smokes. Began to wheezes abdomen. No fever. No productive cough. No chest pain. Received Solu-Medrol and albuterol en route and feels somewhat better. Denies any aggravating or alleviating symptoms otherwise. Apparently he was feeling very tight earlier in doing better now.    Past Medical History:  Diagnosis Date  . Asthma     Patient Active Problem List   Diagnosis Date Noted  . Asthma exacerbation 02/20/2016    No past surgical history on file.  Prior to Admission medications   Medication Sig Start Date End Date Taking? Authorizing Provider  albuterol (PROVENTIL HFA;VENTOLIN HFA) 108 (90 Base) MCG/ACT inhaler Inhale 2-4 puffs by mouth every 4 hours as needed for wheezing, cough, and/or shortness of breath 02/23/16   Adrian SaranMody, Sital, MD    Allergies Patient has no known allergies.  No family history on file.  Social History Social History  Substance Use Topics  . Smoking status: Current Every Day Smoker  . Smokeless tobacco: Never Used  . Alcohol use Yes    Review of Systems Constitutional: No fever/chills Eyes: No visual changes. ENT: No sore throat. No stiff neck no neck pain Cardiovascular: Denies chest pain. Respiratory: Positive shortness of breath. Gastrointestinal:   no vomiting.  No diarrhea.  No constipation. Genitourinary: Negative for dysuria. Musculoskeletal: Negative lower extremity swelling Skin: Negative for rash. Neurological: Negative for severe headaches, focal weakness or numbness.   ____________________________________________   PHYSICAL EXAM:  VITAL SIGNS: ED Triage Vitals [07/06/16 1523]   Enc Vitals Group     BP      Pulse Rate 95     Resp (!) 22     Temp 98 F (36.7 C)     Temp Source Oral     SpO2 94 %     Weight      Height 5\' 8"  (1.727 m)     Head Circumference      Peak Flow      Pain Score 0     Pain Loc      Pain Edu?      Excl. in GC?     Constitutional: Alert and oriented. Well appearing and in no acute distress. Eyes: Conjunctivae are normal Head: Atraumatic HEENT: No congestion/rhinnorhea. Mucous membranes are moist.  Oropharynx non-erythematous Neck:   Nontender with no meningismus, no masses, no stridor Cardiovascular: Normal rate, regular rhythm. Grossly normal heart sounds.  Good peripheral circulation. Respiratory: Normal respiratory effort.  No retractions. Occasional mild wheeze especially in the upper fields. Abdominal: Soft and nontender. No distention. No guarding no rebound Back:  There is no focal tenderness or step off.  there is no midline tenderness there are no lesions noted. there is no CVA tenderness Musculoskeletal: No lower extremity tenderness, no upper extremity tenderness. No joint effusions, no DVT signs strong distal pulses no edema Neurologic:  Normal speech and language. No gross focal neurologic deficits are appreciated.  Skin:  Skin is warm, dry and intact. No rash noted. Psychiatric: Mood and affect are normal. Speech and behavior are normal.  ____________________________________________   LABS (all labs ordered are listed, but only abnormal results are displayed)  Labs Reviewed - No data to  display ____________________________________________  EKG  I personally interpreted any EKGs ordered by me or triage Sinus rhythm rate 96 bpm normal axis no acute ischemic changes nonspecific ST changes ____________________________________________  RADIOLOGY  I reviewed any imaging ordered by me or triage that were performed during my shift and, if possible, patient and/or family made aware of any abnormal  findings. ____________________________________________   PROCEDURES  Procedure(s) performed: None  Procedures  Critical Care performed: CRITICAL CARE Performed by: Jeanmarie Plant   Total critical care time: 40 minutes  Critical care time was exclusive of separately billable procedures and treating other patients.  Critical care was necessary to treat or prevent imminent or life-threatening deterioration.  Critical care was time spent personally by me on the following activities: development of treatment plan with patient and/or surrogate as well as nursing, discussions with consultants, evaluation of patient's response to treatment, examination of patient, obtaining history from patient or surrogate, ordering and performing treatments and interventions, ordering and review of laboratory studies, ordering and review of radiographic studies, pulse oximetry and re-evaluation of patient's condition.   ____________________________________________   INITIAL IMPRESSION / ASSESSMENT AND PLAN / ED COURSE  Pertinent labs & imaging results that were available during my care of the patient were reviewed by me and considered in my medical decision making (see chart for details).  Smoking cessation counseled for 4 minutes   Patient here with an asthma flare, tobacco abuse and wheeze, much better after nebulizers from EMS. Low suspicion for ACS PE or dissection. We'll get a chest x-ray. We'll give him more albuterol and reassess. If he requires more than that he may require blood work at this time, I don't think it would affect management.  _____________________  ----------------------------------------- 5:58 PM on 07/06/2016 -----------------------------------------  Patient felt better briefly but now has recurrent wheeze, he feels fatigued we will admit him to the hospital. _______________________   FINAL CLINICAL IMPRESSION(S) / ED DIAGNOSES  Final diagnoses:  None       This chart was dictated using voice recognition software.  Despite best efforts to proofread,  errors can occur which can change meaning.      Jeanmarie Plant, MD 07/06/16 1533    Jeanmarie Plant, MD 07/06/16 Windy Fast

## 2016-07-06 NOTE — ED Notes (Signed)
Patient transported to X-ray 

## 2016-07-06 NOTE — H&P (Addendum)
[facility] - [place] at [facility]   PATIENT NAME: Justin NorthSteven Reid    MR#:  [id number]  DATE OF BIRTH:  18-Nov-1980  DATE OF ADMISSION:  07/06/2016  PRIMARY CARE PHYSICIAN: Patient, No Pcp Per   REQUESTING/REFERRING PHYSICIAN: Jeanmarie PlantMcShane, James A, MD  CHIEF COMPLAINT:   Chief Complaint  Patient presents with  . Shortness of Breath   Cough, wheezing and shortness breath today. HISTORY OF PRESENT ILLNESS:  Justin Reid  is a 36 y.o. male with a known history of Asthma. The patient presently ED with the above chief complaint. He said he was fine until this morning, he started to have cough and wheezing, which has been worsening since this morning. In addition, he developed  shortness of breath. She denies any fever or chills, no orthopnea, nocturnal dyspnea or leg swelling. He also complains of nausea, hematemesis and bloody stool today. But denies any abdominal pain or diarrhea. He has tachycardia and tachypnea. He is treated with several doses of nebulizer result improvement. PAST MEDICAL HISTORY:   Past Medical History:  Diagnosis Date  . Asthma     PAST SURGICAL HISTORY:  No past surgical history on file.  SOCIAL HISTORY:   Social History  Substance Use Topics  . Smoking status: Current Every Day Smoker    Packs/day: 0.25    Years: 5.00    Types: Cigarettes  . Smokeless tobacco: Never Used  . Alcohol use Yes     Comment: occasionally    FAMILY HISTORY:   Family History  Problem Relation Age of Onset  . Hypertension Father     DRUG ALLERGIES:  No Known Allergies  REVIEW OF SYSTEMS:   Review of Systems  Constitutional: Negative for chills, fever and malaise/fatigue.  HENT: Negative for sinus pain and sore throat.   Eyes: Negative for blurred vision and double vision.  Respiratory: Positive for cough, shortness of breath and wheezing. Negative for hemoptysis, sputum production and stridor.   Cardiovascular: Negative for chest pain,  palpitations and leg swelling.  Gastrointestinal: Positive for blood in stool, nausea and vomiting. Negative for abdominal pain, diarrhea and melena.  Genitourinary: Negative for dysuria and hematuria.  Musculoskeletal: Negative for back pain.  Skin: Negative for rash.  Neurological: Negative for dizziness, focal weakness, loss of consciousness and weakness.  Psychiatric/Behavioral: Negative for depression. The patient is nervous/anxious.     MEDICATIONS AT HOME:   Prior to Admission medications   Medication Sig Start Date End Date Taking? Authorizing Provider  albuterol (PROVENTIL HFA;VENTOLIN HFA) 108 (90 Base) MCG/ACT inhaler Inhale 2-4 puffs by mouth every 4 hours as needed for wheezing, cough, and/or shortness of breath 02/23/16  Yes Mody, Sital, MD      VITAL SIGNS:  Blood pressure (!) 139/99, pulse (!) 115, temperature 98 F (36.7 C), temperature source Oral, resp. rate (!) 26, height 5\' 8"  (1.727 m), SpO2 99 %.  PHYSICAL EXAMINATION:  Physical Exam  GENERAL:  36 y.o.-year-old patient lying in the bed with no acute distress. Morbidly obese. EYES: Pupils equal, round, reactive to light and accommodation. No scleral icterus. Extraocular muscles intact.  HEENT: Head atraumatic, normocephalic. Oropharynx and nasopharynx clear.  NECK:  Supple, no jugular venous distention. No thyroid enlargement, no tenderness.  LUNGS: Bilateral expiratory wheezing, no rales,rhonchi or crepitation. No use of accessory muscles of respiration.  CARDIOVASCULAR: S1, S2 normal. No murmurs, rubs, or gallops.  ABDOMEN: Soft, nontender, nondistended. Bowel sounds present. No organomegaly or mass.  EXTREMITIES: No pedal edema, cyanosis,  or clubbing.  NEUROLOGIC: Cranial nerves II through XII are intact. Muscle strength 5/5 in all extremities. Sensation intact. Gait not checked.  PSYCHIATRIC: The patient is alert and oriented x 3.  SKIN: No obvious rash, lesion, or ulcer.   LABORATORY PANEL:    CBC  Recent Labs Lab 07/06/16 1635  WBC 7.5  HGB 12.8*  HCT 38.8*  PLT 389   ------------------------------------------------------------------------------------------------------------------  Chemistries   Recent Labs Lab 07/06/16 1635  NA 135  K 3.9  CL 102  CO2 24  GLUCOSE 113*  BUN 14  CREATININE 1.16  CALCIUM 9.0   ------------------------------------------------------------------------------------------------------------------  Cardiac Enzymes No results for input(s): TROPONINI in the last 168 hours. ------------------------------------------------------------------------------------------------------------------  RADIOLOGY:  Dg Chest 2 View  Result Date: 07/06/2016 CLINICAL DATA:  Asthma EXAM: CHEST  2 VIEW COMPARISON:  February 20, 2016 FINDINGS: Lungs are clear. Heart size and pulmonary vascularity are normal. No adenopathy. No pneumomediastinum or pneumothorax. No bone lesions. There is equivocal leftward deviation of the upper thoracic trachea. IMPRESSION: Equivocal leftward deviation of the upper thoracic trachea. Question right-sided thyroid enlargement. No edema or consolidation. Electronically Signed   By: Bretta Bang III M.D.   On: 07/06/2016 16:11      IMPRESSION AND PLAN:   Asthma exacerbation. The patient will be admitted to medical floor. Start DuoNeb every 6 hours, IV Solu-Medrol, Tessalon and Robitussin when necessary.  Hematemesis and bloody stool Start Protonix twice a day. Clear liquid diet. Follow-up hemoglobin and GI consult.  Morbid obesity.  Tobacco abuse. Smoking cessation was counseled for 3-4 minutes.  All the records are reviewed and case discussed with ED provider. Management plans discussed with the patient, family and they are in agreement.  CODE STATUS: Full code  TOTAL TIME TAKING CARE OF THIS PATIENT: 55 minutes.    Shaune Pollack M.D on 07/06/2016 at 6:22 PM  Between 7am to 6pm - Pager - [phone]  After  6pm go to [email or web address] - Social research officer, government  [facility] [place] [facility]  Office  [phone]  CC: Primary care physician; Patient, No Pcp Per   Note: This dictation was prepared with Dragon dictation along with smaller phrase technology. Any transcriptional errors that result from this process are unintentional.

## 2016-07-06 NOTE — ED Triage Notes (Signed)
Pt here from home via [facility] with shortness of breath and difficulty breathing. Pt with hx of asthma, EMS reports when they arrived, pt was using accessory muscles and had wheezing in all lung fields. EMS reports giving 1 albuterol and 2 duonebs, 125mg  of solumedrol en route.

## 2016-07-06 NOTE — ED Notes (Signed)
Hospitalist to bedside at this time 

## 2016-07-06 NOTE — ED Notes (Signed)
Provided pt with a Malaw[place]urkey tray sandwich no further needs at present

## 2016-07-06 NOTE — ED Notes (Signed)
Provided pt with a cup of wwater no further needs at present

## 2016-07-07 DIAGNOSIS — J45909 Unspecified asthma, uncomplicated: Secondary | ICD-10-CM | POA: Diagnosis present

## 2016-07-07 LAB — HIV ANTIBODY (ROUTINE TESTING W REFLEX): HIV Screen 4th Generation wRfx: NONREACTIVE

## 2016-07-07 LAB — HEMOGLOBIN: Hemoglobin: 12.8 g/dL — ABNORMAL LOW (ref 13.0–18.0)

## 2016-07-07 MED ORDER — METHYLPREDNISOLONE SODIUM SUCC 40 MG IJ SOLR
40.0000 mg | Freq: Four times a day (QID) | INTRAMUSCULAR | Status: DC
  Administered 2016-07-07 – 2016-07-08 (×4): 40 mg via INTRAVENOUS
  Filled 2016-07-07 (×4): qty 1

## 2016-07-07 MED ORDER — IPRATROPIUM-ALBUTEROL 0.5-2.5 (3) MG/3ML IN SOLN
3.0000 mL | RESPIRATORY_TRACT | Status: DC
  Administered 2016-07-07 – 2016-07-08 (×6): 3 mL via RESPIRATORY_TRACT
  Filled 2016-07-07 (×2): qty 3
  Filled 2016-07-07: qty 30
  Filled 2016-07-07 (×3): qty 3

## 2016-07-07 MED ORDER — BUDESONIDE 0.5 MG/2ML IN SUSP
0.5000 mg | Freq: Two times a day (BID) | RESPIRATORY_TRACT | Status: DC
  Administered 2016-07-07 – 2016-07-08 (×2): 0.5 mg via RESPIRATORY_TRACT
  Filled 2016-07-07 (×2): qty 2

## 2016-07-07 NOTE — Care Management (Signed)
Admitted to this facility with the diagnosis of asthma. Lives with mother Justin Reid [phone]). No primary care physician. No insurance. Works at [employer] every 2 weeks. Takes care of all basic activities of daily living himself, doesn't drive. Application for [facility] and [facility] given to mother. [place].  [name] telephone # is [phone]. Gwenette GreetBrenda S Ajanae Virag RN MSN CCM Care Management [phone]

## 2016-07-07 NOTE — Progress Notes (Signed)
[facility] - [place] at [facility]   PATIENT NAME: Justin Reid    MR#:  [id number]  DATE OF BIRTH:  Jul 06, 1980  SUBJECTIVE:   Patient here due to asthma exacerbation. Still having significant wheezing and shortness of breath. Patient also says that he had blood in his stool.  REVIEW OF SYSTEMS:    Review of Systems  Constitutional: Negative for chills and fever.  HENT: Negative for congestion and tinnitus.   Eyes: Negative for blurred vision and double vision.  Respiratory: Positive for shortness of breath and wheezing. Negative for cough.   Cardiovascular: Negative for chest pain, orthopnea and PND.  Gastrointestinal: Positive for blood in stool. Negative for abdominal pain, diarrhea, nausea and vomiting.  Genitourinary: Negative for dysuria and hematuria.  Neurological: Negative for dizziness, sensory change and focal weakness.  All other systems reviewed and are negative.   Nutrition: Regular diet Tolerating Diet: Yes Tolerating PT: Ambulatory  DRUG ALLERGIES:  No Known Allergies  VITALS:  Blood pressure (!) 153/96, pulse 95, temperature (!) 97.5 F (36.4 C), temperature source Oral, resp. rate 20, height 5\' 11"  (1.803 m), weight 119.6 kg (263 lb 11.2 oz), SpO2 97 %.  PHYSICAL EXAMINATION:   Physical Exam  GENERAL:  36 y.o.-year-old obese patient lying in bed in mild Resp. Distress.  EYES: Pupils equal, round, reactive to light and accommodation. No scleral icterus. Extraocular muscles intact.  HEENT: Head atraumatic, normocephalic. Oropharynx and nasopharynx clear.  NECK:  Supple, no jugular venous distention. No thyroid enlargement, no tenderness.  LUNGS: Diffuse inspiratory and expiratory wheezing, no rales, rhonchi. No use of accessory muscles. No dullness to percussion. CARDIOVASCULAR: S1, S2 normal. No murmurs, rubs, or gallops.  ABDOMEN: Soft, nontender, nondistended. Bowel sounds present. No organomegaly or mass.  EXTREMITIES: No cyanosis,  clubbing or edema b/l.    NEUROLOGIC: Cranial nerves II through XII are intact. No focal Motor or sensory deficits b/l.   PSYCHIATRIC: The patient is alert and oriented x 3.  SKIN: No obvious rash, lesion, or ulcer.    LABORATORY PANEL:   CBC  Recent Labs Lab 07/06/16 1635 07/07/16 0532  WBC 7.5  --   HGB 12.8* 12.8*  HCT 38.8*  --   PLT 389  --    ------------------------------------------------------------------------------------------------------------------  Chemistries   Recent Labs Lab 07/06/16 1635  NA 135  K 3.9  CL 102  CO2 24  GLUCOSE 113*  BUN 14  CREATININE 1.16  CALCIUM 9.0  MG 2.1   ------------------------------------------------------------------------------------------------------------------  Cardiac Enzymes No results for input(s): TROPONINI in the last 168 hours. ------------------------------------------------------------------------------------------------------------------  RADIOLOGY:  Dg Chest 2 View  Result Date: 07/06/2016 CLINICAL DATA:  Asthma EXAM: CHEST  2 VIEW COMPARISON:  February 20, 2016 FINDINGS: Lungs are clear. Heart size and pulmonary vascularity are normal. No adenopathy. No pneumomediastinum or pneumothorax. No bone lesions. There is equivocal leftward deviation of the upper thoracic trachea. IMPRESSION: Equivocal leftward deviation of the upper thoracic trachea. Question right-sided thyroid enlargement. No edema or consolidation. Electronically Signed   By: Bretta Bang III M.D.   On: 07/06/2016 16:11     ASSESSMENT AND PLAN:   36 year old male with past medical history of asthma who presented to the hospital due to shortness of breath, wheezing and also noted to have bloody stools.  1. Asthma exacerbation - this is the cause of pt's shortness of breath, wheezing.  - CXR (-) for pneumonia.  Still having wheezing/bronchospasm.  - will increase IV Steroids, cont. Duonebs, will add  Pulmicort nebs.  - follow clinically.    2. Blood in stool - not witnessed by nursing staff. Pt. Says he is having hematemesis and blood in stool.  - Hg. Stable, hemodynamically stable.  - follow Hg. GI consult pending.  Cont. PPI BID.      All the records are reviewed and case discussed with Care Management/Social Worker. Management plans discussed with the patient, family and they are in agreement.  CODE STATUS: Full code  DVT Prophylaxis: Ted's & SCD's.   TOTAL TIME TAKING CARE OF THIS PATIENT: 30 minutes.   POSSIBLE D/C IN 1-2 DAYS, DEPENDING ON CLINICAL CONDITION.   [place] SirenSAINANI,Molina Hollenback J M.D on 07/07/2016 at 1:01 PM  Between 7am to 6pm - Pager - [phone]  After 6pm go to [email or web address] - Social research officer, governmentpassword EPAS [facility]  [facility] [place] [facility]  Office  [phone]  CC: Primary care physician; Patient, No Pcp Per

## 2016-07-08 LAB — CBC
HCT: 41.6 % (ref 40.0–52.0)
HEMOGLOBIN: 13.3 g/dL (ref 13.0–18.0)
MCH: 26.8 pg (ref 26.0–34.0)
MCHC: 32.1 g/dL (ref 32.0–36.0)
MCV: 83.6 fL (ref 80.0–100.0)
PLATELETS: 431 10*3/uL (ref 150–440)
RBC: 4.97 MIL/uL (ref 4.40–5.90)
RDW: 14.6 % — ABNORMAL HIGH (ref 11.5–14.5)
WBC: 17.2 10*3/uL — AB (ref 3.8–10.6)

## 2016-07-08 MED ORDER — PREDNISONE 10 MG PO TABS: ORAL_TABLET | ORAL | 0 refills | Status: DC

## 2016-07-08 MED ORDER — MOMETASONE FURO-FORMOTEROL FUM 100-5 MCG/ACT IN AERO
2.0000 | INHALATION_SPRAY | Freq: Two times a day (BID) | RESPIRATORY_TRACT | Status: DC
  Administered 2016-07-08: 10:00:00 2 via RESPIRATORY_TRACT
  Filled 2016-07-08: qty 8.8

## 2016-07-08 NOTE — Progress Notes (Addendum)
[facility]    Justin Reid was admitted to the Hospital on 07/06/2016 and Discharged  07/08/2016 and should be excused from work/school   for 7 days starting 07/06/2016 , may return to work/school without any restrictions.  Call Justin LiasVivek Johneisha Broaden MD, [facility]  [phone] with questions.  Justin Reid,Justin Reid M.D on 07/08/2016,at 9:58 AM

## 2016-07-08 NOTE — Care Management (Signed)
Discharge to home today per Dr. Cherlynn KaiserSainani. Applications for [facility] and medication management given yesterday. Mother will transport Justin GreetBrenda S Jeremy Ditullio RN MSN CCM Care Management [phone]

## 2016-07-08 NOTE — Discharge Summary (Signed)
[facility] - [place] at [facility]   PATIENT NAME: Justin Reid    MR#:  [id number]  DATE OF BIRTH:  January 03, 1981  DATE OF ADMISSION:  07/06/2016 ADMITTING PHYSICIAN: Shaune PollackQing Chen, MD  DATE OF DISCHARGE: 07/08/2016 11:18 AM  PRIMARY CARE PHYSICIAN: Patient, No Pcp Per    ADMISSION DIAGNOSIS:  Moderate persistent asthma with acute exacerbation [J45.41]  DISCHARGE DIAGNOSIS:  Active Problems:   Asthma exacerbation   Asthma   SECONDARY DIAGNOSIS:   Past Medical History:  Diagnosis Date  . Asthma     HOSPITAL COURSE:   36 year old male with past medical history of asthma who presented to the hospital due to shortness of breath, wheezing and also noted to have bloody stools.  1. Asthma exacerbation - this was the cause of pt's shortness of breath, wheezing.  - his CXR was (-) for pneumonia.  Patient was treated with IV steroids, scheduled DuoNeb's, Pulmicort nebs. His wheezing and bronchospasm has improved. He is now being discharged on prednisone taper, albuterol inhaler as needed and Dulera. -he was referred to med management to help with his inhalers as he could not afford    2. Blood in stool - not witnessed by nursing staff while in the hospital.  - Hg. Stable, hemodynamically stable.  - follow up with GI as outpatient.   DISCHARGE CONDITIONS:   Stable  CONSULTS OBTAINED:  Treatment Team:  Midge MiniumWohl, Darren, MD  DRUG ALLERGIES:  No Known Allergies  DISCHARGE MEDICATIONS:   Allergies as of 07/08/2016   No Known Allergies     Medication List    TAKE these medications   albuterol 108 (90 Base) MCG/ACT inhaler Commonly known as:  PROVENTIL HFA;VENTOLIN HFA Inhale 2-4 puffs by mouth every 4 hours as needed for wheezing, cough, and/or shortness of breath   predniSONE 10 MG tablet Commonly known as:  DELTASONE Label  & dispense according to the schedule below. 5 Pills PO for 1 day then, 4 Pills PO for 1 day, 3 Pills PO for 1 day, 2 Pills PO for 1 day,  1 Pill PO for 1 days then STOP.         DISCHARGE INSTRUCTIONS:   DIET:  Regular diet  DISCHARGE CONDITION:  Stable  ACTIVITY:  Activity as tolerated  OXYGEN:  Home Oxygen: No.   Oxygen Delivery: room air  DISCHARGE LOCATION:  home   If you experience worsening of your admission symptoms, develop shortness of breath, life threatening emergency, suicidal or homicidal thoughts you must seek medical attention immediately by calling 911 or calling your MD immediately  if symptoms less severe.  You Must read complete instructions/literature along with all the possible adverse reactions/side effects for all the Medicines you take and that have been prescribed to you. Take any new Medicines after you have completely understood and accpet all the possible adverse reactions/side effects.   Please note  You were cared for by a hospitalist during your hospital stay. If you have any questions about your discharge medications or the care you received while you were in the hospital after you are discharged, you can call the unit and asked to speak with the hospitalist on call if the hospitalist that took care of you is not available. Once you are discharged, your primary care physician will handle any further medical issues. Please note that NO REFILLS for any discharge medications will be authorized once you are discharged, as it is imperative that you return to your primary care physician (or  establish a relationship with a primary care physician if you do not have one) for your aftercare needs so that they can reassess your need for medications and monitor your lab values.     Today   Shortness of breath, wheezing much improved. No further hematemesis or Hematochezia.  VITAL SIGNS:  Blood pressure (!) 156/95, pulse 94, temperature 97.8 F (36.6 C), temperature source Oral, resp. rate 18, height 5\' 11"  (1.803 m), weight 119.6 kg (263 lb 11.2 oz), SpO2 99 %.  I/O:   Intake/Output  Summary (Last 24 hours) at 07/08/16 1539 Last data filed at 07/07/16 1821  Gross per 24 hour  Intake              480 ml  Output                0 ml  Net              480 ml    PHYSICAL EXAMINATION:  GENERAL:  36 y.o.-year-old patient lying in the bed with no acute distress.  EYES: Pupils equal, round, reactive to light and accommodation. No scleral icterus. Extraocular muscles intact.  HEENT: Head atraumatic, normocephalic. Oropharynx and nasopharynx clear.  NECK:  Supple, no jugular venous distention. No thyroid enlargement, no tenderness.  LUNGS: Normal breath sounds bilaterally, minimal end-exp. Wheezing b/l, No rales,rhonchi. No use of accessory muscles of respiration.  CARDIOVASCULAR: S1, S2 normal. No murmurs, rubs, or gallops.  ABDOMEN: Soft, non-tender, non-distended. Bowel sounds present. No organomegaly or mass.  EXTREMITIES: No pedal edema, cyanosis, or clubbing.  NEUROLOGIC: Cranial nerves II through XII are intact. No focal motor or sensory defecits b/l.  PSYCHIATRIC: The patient is alert and oriented x 3. Good affect.  SKIN: No obvious rash, lesion, or ulcer.   DATA REVIEW:   CBC  Recent Labs Lab 07/08/16 0710  WBC 17.2*  HGB 13.3  HCT 41.6  PLT 431    Chemistries   Recent Labs Lab 07/06/16 1635  NA 135  K 3.9  CL 102  CO2 24  GLUCOSE 113*  BUN 14  CREATININE 1.16  CALCIUM 9.0  MG 2.1    Cardiac Enzymes No results for input(s): TROPONINI in the last 168 hours.  Microbiology Results  Results for orders placed or performed during the hospital encounter of 02/20/16  MRSA PCR Screening     Status: None   Collection Time: 02/20/16  6:33 AM  Result Value Ref Range Status   MRSA by PCR NEGATIVE NEGATIVE Final    Comment:        The GeneXpert MRSA Assay (FDA approved for NASAL specimens only), is one component of a comprehensive MRSA colonization surveillance program. It is not intended to diagnose MRSA infection nor to guide or monitor  treatment for MRSA infections.     RADIOLOGY:  Dg Chest 2 View  Result Date: 07/06/2016 CLINICAL DATA:  Asthma EXAM: CHEST  2 VIEW COMPARISON:  February 20, 2016 FINDINGS: Lungs are clear. Heart size and pulmonary vascularity are normal. No adenopathy. No pneumomediastinum or pneumothorax. No bone lesions. There is equivocal leftward deviation of the upper thoracic trachea. IMPRESSION: Equivocal leftward deviation of the upper thoracic trachea. Question right-sided thyroid enlargement. No edema or consolidation. Electronically Signed   By: Bretta Bang III M.D.   On: 07/06/2016 16:11      Management plans discussed with the patient, family and they are in agreement.  CODE STATUS:  Code Status History    Date Active Date  Inactive Code Status Order ID Comments User Context   07/06/2016  8:42 PM 07/08/2016  2:24 PM Full Code [id number]  Shaune Pollack, MD Inpatient   TOTAL TIME TAKING CARE OF THIS PATIENT: 40 minutes.    [place] Siren M.D on 07/08/2016 at 3:39 PM  Between 7am to 6pm - Pager - [phone]  After 6pm go to [email or web address] - Social research officer, government  [facility] [place] [facility]  Office  [phone]  CC: Primary care physician; Patient, No Pcp Per

## 2016-07-08 NOTE — Progress Notes (Signed)
Patient is stable and ready for discharge. Discharge paperwork reviewed with patient who verbalized understanding. Prescription for Prednisone and work note attached. Verified patient has applications for [facility] and Medication Management. Patient's mom to transport home.

## 2016-10-04 ENCOUNTER — Emergency Department
Admission: EM | Admit: 2016-10-04 | Discharge: 2016-10-04 | Disposition: A | Discharge status: Home / Self Care | Payer: Self-pay | Attending: Emergency Medicine | Admitting: Emergency Medicine

## 2016-10-04 ENCOUNTER — Encounter: Payer: Self-pay | Admitting: Emergency Medicine

## 2016-10-04 ENCOUNTER — Emergency Department: Payer: Self-pay

## 2016-10-04 DIAGNOSIS — J4521 Mild intermittent asthma with (acute) exacerbation: Secondary | ICD-10-CM | POA: Insufficient documentation

## 2016-10-04 DIAGNOSIS — F17210 Nicotine dependence, cigarettes, uncomplicated: Secondary | ICD-10-CM | POA: Insufficient documentation

## 2016-10-04 MED ORDER — ALBUTEROL SULFATE (2.5 MG/3ML) 0.083% IN NEBU
INHALATION_SOLUTION | RESPIRATORY_TRACT | Status: AC
  Filled 2016-10-04: qty 3

## 2016-10-04 MED ORDER — ALBUTEROL SULFATE (2.5 MG/3ML) 0.083% IN NEBU
2.5000 mg | INHALATION_SOLUTION | Freq: Once | RESPIRATORY_TRACT | Status: AC
  Administered 2016-10-04: 2.5 mg via RESPIRATORY_TRACT
  Filled 2016-10-04: qty 3

## 2016-10-04 MED ORDER — ALBUTEROL SULFATE (2.5 MG/3ML) 0.083% IN NEBU
5.0000 mg | INHALATION_SOLUTION | Freq: Once | RESPIRATORY_TRACT | Status: AC
  Administered 2016-10-04: 5 mg via RESPIRATORY_TRACT
  Filled 2016-10-04: qty 6

## 2016-10-04 MED ORDER — ALBUTEROL SULFATE HFA 108 (90 BASE) MCG/ACT IN AERS: 2.0000 | INHALATION_SPRAY | RESPIRATORY_TRACT | 1 refills | Status: DC | PRN

## 2016-10-04 MED ORDER — PREDNISONE 50 MG PO TABS: 50.0000 mg | ORAL_TABLET | Freq: Every day | ORAL | 0 refills | Status: DC

## 2016-10-04 MED ORDER — METHYLPREDNISOLONE SODIUM SUCC 125 MG IJ SOLR
125.0000 mg | Freq: Once | INTRAMUSCULAR | Status: AC
  Administered 2016-10-04: 125 mg via INTRAMUSCULAR
  Filled 2016-10-04: qty 2

## 2016-10-04 NOTE — ED Triage Notes (Signed)
C/O wheezing and SOB x 1 day.  Patient states he is out of his inhaler.

## 2016-10-04 NOTE — ED Provider Notes (Signed)
[facility] Emergency Department Provider Note  ____________________________________________  Time seen: Approximately 2:07 PM  I have reviewed the triage vital signs and the nursing notes.   HISTORY  Chief Complaint Wheezing    HPI Justin Reid is a 36 y.o. male who presents to emergency department complaining of an asthma exacerbation. Patient reports he has mild intermittent asthma and started noticing symptoms yesterday. Patient was out of his albuterol inhaler was unable to take any medications for this complaint. Patient reports that he has had one breathing treatment and triage which improved symptoms significantly. Patient denies any fevers or chills, nasal congestion, coughing. Patient reports audible wheezing and a mild shortness of breath. No chest pain. No complaints at this time.   Past Medical History:  Diagnosis Date  . Asthma     Patient Active Problem List   Diagnosis Date Noted  . Asthma 07/07/2016  . Asthma exacerbation 02/20/2016    History reviewed. No pertinent surgical history.  Prior to Admission medications   Medication Sig Start Date End Date Taking? Authorizing Provider  albuterol (PROVENTIL HFA;VENTOLIN HFA) 108 (90 Base) MCG/ACT inhaler Inhale 2 puffs into the lungs every 4 (four) hours as needed for wheezing or shortness of breath. 10/04/16   Darrian Grzelak, Delorise Royals, PA-C  predniSONE (DELTASONE) 50 MG tablet Take 1 tablet (50 mg total) by mouth daily with breakfast. 10/04/16   Vonnie Ligman, Delorise Royals, PA-C    Allergies Patient has no known allergies.  Family History  Problem Relation Age of Onset  . Hypertension Father     Social History Social History  Substance Use Topics  . Smoking status: Current Every Day Smoker    Packs/day: 0.25    Years: 5.00    Types: Cigarettes  . Smokeless tobacco: Never Used  . Alcohol use Yes     Comment: occasionally     Review of Systems  Constitutional: No  fever/chills Eyes: No visual changes. No discharge ENT: No upper respiratory complaints. Cardiovascular: no chest pain. Respiratory: no cough. Positive for wheezing and mild shortness of breath. Gastrointestinal: No abdominal pain.  No nausea, no vomiting.  No diarrhea.  No constipation. Musculoskeletal: Negative for musculoskeletal pain. Skin: Negative for rash, abrasions, lacerations, ecchymosis. Neurological: Negative for headaches, focal weakness or numbness. 10-point ROS otherwise negative.  ____________________________________________   PHYSICAL EXAM:  VITAL SIGNS: ED Triage Vitals  Enc Vitals Group     BP 10/04/16 1218 (!) 144/82     Pulse Rate 10/04/16 1218 84     Resp 10/04/16 1218 18     Temp 10/04/16 1218 98.7 F (37.1 C)     Temp Source 10/04/16 1218 Oral     SpO2 10/04/16 1218 99 %     Weight 10/04/16 1209 263 lb (119.3 kg)     Height 10/04/16 1209  (1.778 m)     Head Circumference --      Peak Flow --      Pain Score --      Pain Loc --      Pain Edu? --      Excl. in GC? --      Constitutional: Alert and oriented. Well appearing and in no acute distress. Eyes: Conjunctivae are normal. PERRL. EOMI. Head: Atraumatic. ENT:      Ears: EACs and TMs unremarkable bilaterally      Nose: No congestion/rhinnorhea.      Mouth/Throat: Mucous membranes are moist.  Neck: No stridor.   Hematological/Lymphatic/Immunilogical: No cervical lymphadenopathy.  Cardiovascular: Normal rate, regular rhythm. Normal S1 and S2.  Good peripheral circulation. Respiratory: Normal respiratory effort without tachypnea or retractions. Lungs with mild inspiratory wheezing bilaterally. No rales or rhonchi.Peri Jefferson air entry to the bases with no decreased or absent breath sounds. Musculoskeletal: Full range of motion to all extremities. No gross deformities appreciated. Neurologic:  Normal speech and language. No gross focal neurologic deficits are appreciated.  Skin:  Skin is warm,  dry and intact. No rash noted. Psychiatric: Mood and affect are normal. Speech and behavior are normal. Patient exhibits appropriate insight and judgement.   ____________________________________________   LABS (all labs ordered are listed, but only abnormal results are displayed)  Labs Reviewed - No data to display ____________________________________________  EKG   ____________________________________________  RADIOLOGY Festus Barren Laakea Pereira, personally viewed and evaluated these images (plain radiographs) as part of my medical decision making, as well as reviewing the written report by the radiologist.  Dg Chest 2 View  Result Date: 10/04/2016 CLINICAL DATA:  Wheezing. EXAM: CHEST  2 VIEW COMPARISON:  July 06, 2016. FINDINGS: The heart size and mediastinal contours are within normal limits. Both lungs are clear. The visualized skeletal structures are unremarkable. IMPRESSION: No active cardiopulmonary disease. Electronically Signed   By: Obie Dredge M.D.   On: 10/04/2016 12:37    ____________________________________________    PROCEDURES  Procedure(s) performed:    Procedures    Medications  albuterol (PROVENTIL) (2.5 MG/3ML) 0.083% nebulizer solution (  Not Given 10/04/16 1216)  methylPREDNISolone sodium succinate (SOLU-MEDROL) 125 mg/2 mL injection 125 mg (not administered)  albuterol (PROVENTIL) (2.5 MG/3ML) 0.083% nebulizer solution 2.5 mg (not administered)  albuterol (PROVENTIL) (2.5 MG/3ML) 0.083% nebulizer solution 5 mg (5 mg Nebulization Given 10/04/16 1214)     ____________________________________________   INITIAL IMPRESSION / ASSESSMENT AND PLAN / ED COURSE  Pertinent labs & imaging results that were available during my care of the patient were reviewed by me and considered in my medical decision making (see chart for details).  Review of the [place] CSRS was performed in accordance of the [facility] prior to dispensing any controlled drugs.      Patient's diagnosis is consistent with Asthma exacerbation. Patient has no signs of infection. Exam is reassuring. Chest x-ray reveals no consolidation consistent with pneumonia. Patient had significant improvement after breathing treatment. Patient is given one more treatment, still injection which improved symptoms further.. Patient will be discharged home with prescriptions for albuterol and prednisone. Patient is to follow up with primary care as needed or otherwise directed. Patient is given ED precautions to return to the ED for any worsening or new symptoms.     ____________________________________________  FINAL CLINICAL IMPRESSION(S) / ED DIAGNOSES  Final diagnoses:  Mild intermittent asthma with exacerbation      NEW MEDICATIONS STARTED DURING THIS VISIT:  New Prescriptions   ALBUTEROL (PROVENTIL HFA;VENTOLIN HFA) 108 (90 BASE) MCG/ACT INHALER    Inhale 2 puffs into the lungs every 4 (four) hours as needed for wheezing or shortness of breath.   PREDNISONE (DELTASONE) 50 MG TABLET    Take 1 tablet (50 mg total) by mouth daily with breakfast.        This chart was dictated using voice recognition software/Dragon. Despite best efforts to proofread, errors can occur which can change the meaning. Any change was purely unintentional.    Racheal Patches, PA-C 10/04/16 1413    Rockne Menghini, MD 10/04/16 1537

## 2016-11-15 ENCOUNTER — Emergency Department: Payer: Self-pay

## 2016-11-15 ENCOUNTER — Emergency Department
Admission: EM | Admit: 2016-11-15 | Discharge: 2016-11-15 | Disposition: A | Discharge status: Home / Self Care | Payer: Self-pay | Attending: Emergency Medicine | Admitting: Emergency Medicine

## 2016-11-15 ENCOUNTER — Other Ambulatory Visit: Payer: Self-pay

## 2016-11-15 DIAGNOSIS — R0789 Other chest pain: Secondary | ICD-10-CM | POA: Insufficient documentation

## 2016-11-15 DIAGNOSIS — F17210 Nicotine dependence, cigarettes, uncomplicated: Secondary | ICD-10-CM | POA: Insufficient documentation

## 2016-11-15 DIAGNOSIS — J4541 Moderate persistent asthma with (acute) exacerbation: Secondary | ICD-10-CM | POA: Insufficient documentation

## 2016-11-15 DIAGNOSIS — R042 Hemoptysis: Secondary | ICD-10-CM | POA: Insufficient documentation

## 2016-11-15 DIAGNOSIS — R079 Chest pain, unspecified: Secondary | ICD-10-CM

## 2016-11-15 DIAGNOSIS — I10 Essential (primary) hypertension: Secondary | ICD-10-CM | POA: Insufficient documentation

## 2016-11-15 DIAGNOSIS — J069 Acute upper respiratory infection, unspecified: Secondary | ICD-10-CM | POA: Insufficient documentation

## 2016-11-15 HISTORY — DX: Essential (primary) hypertension: I10

## 2016-11-15 LAB — CBC
HCT: 42.7 % (ref 40.0–52.0)
Hemoglobin: 13.7 g/dL (ref 13.0–18.0)
MCH: 26.4 pg (ref 26.0–34.0)
MCHC: 32 g/dL (ref 32.0–36.0)
MCV: 82.7 fL (ref 80.0–100.0)
PLATELETS: 427 10*3/uL (ref 150–440)
RBC: 5.17 MIL/uL (ref 4.40–5.90)
RDW: 14.2 % (ref 11.5–14.5)
WBC: 6.6 10*3/uL (ref 3.8–10.6)

## 2016-11-15 LAB — COMPREHENSIVE METABOLIC PANEL
ALK PHOS: 58 U/L (ref 38–126)
ALT: 20 U/L (ref 17–63)
AST: 22 U/L (ref 15–41)
Albumin: 4.3 g/dL (ref 3.5–5.0)
Anion gap: 14 (ref 5–15)
BUN: 16 mg/dL (ref 6–20)
CALCIUM: 9.1 mg/dL (ref 8.9–10.3)
CHLORIDE: 100 mmol/L — AB (ref 101–111)
CO2: 21 mmol/L — AB (ref 22–32)
CREATININE: 0.74 mg/dL (ref 0.61–1.24)
GFR calc Af Amer: >60 mL/min (ref >60)
GFR calc non Af Amer: >60 mL/min (ref >60)
GLUCOSE: 93 mg/dL (ref 65–99)
Potassium: 4.3 mmol/L (ref 3.5–5.1)
SODIUM: 135 mmol/L (ref 135–145)
Total Bilirubin: 0.5 mg/dL (ref 0.3–1.2)
Total Protein: 7.9 g/dL (ref 6.5–8.1)

## 2016-11-15 LAB — BRAIN NATRIURETIC PEPTIDE: B Natriuretic Peptide: 10 pg/mL (ref 0.0–100.0)

## 2016-11-15 LAB — LIPASE, BLOOD: LIPASE: 21 U/L (ref 11–51)

## 2016-11-15 LAB — TROPONIN I

## 2016-11-15 MED ORDER — BENZONATATE 100 MG PO CAPS
200.0000 mg | ORAL_CAPSULE | Freq: Once | ORAL | Status: AC
  Administered 2016-11-15: 200 mg via ORAL
  Filled 2016-11-15: qty 2

## 2016-11-15 MED ORDER — ALBUTEROL SULFATE (2.5 MG/3ML) 0.083% IN NEBU
5.0000 mg | INHALATION_SOLUTION | Freq: Once | RESPIRATORY_TRACT | Status: AC
  Administered 2016-11-15: 5 mg via RESPIRATORY_TRACT
  Filled 2016-11-15: qty 6

## 2016-11-15 MED ORDER — ACETAMINOPHEN 500 MG PO TABS
1000.0000 mg | ORAL_TABLET | Freq: Once | ORAL | Status: AC
  Administered 2016-11-15: 1000 mg via ORAL
  Filled 2016-11-15 (×2): qty 2

## 2016-11-15 MED ORDER — ALBUTEROL SULFATE (2.5 MG/3ML) 0.083% IN NEBU
2.5000 mg | INHALATION_SOLUTION | Freq: Once | RESPIRATORY_TRACT | Status: AC
  Administered 2016-11-15: 2.5 mg via RESPIRATORY_TRACT
  Filled 2016-11-15: qty 3

## 2016-11-15 MED ORDER — AZITHROMYCIN 250 MG PO TABS: ORAL_TABLET | ORAL | 0 refills | Status: AC

## 2016-11-15 MED ORDER — PREDNISONE 20 MG PO TABS: 60.0000 mg | ORAL_TABLET | Freq: Every day | ORAL | 0 refills | Status: DC

## 2016-11-15 MED ORDER — IPRATROPIUM-ALBUTEROL 0.5-2.5 (3) MG/3ML IN SOLN
3.0000 mL | Freq: Once | RESPIRATORY_TRACT | Status: AC
  Administered 2016-11-15: 3 mL via RESPIRATORY_TRACT
  Filled 2016-11-15 (×2): qty 3

## 2016-11-15 MED ORDER — ALBUTEROL SULFATE HFA 108 (90 BASE) MCG/ACT IN AERS: 2.0000 | INHALATION_SPRAY | RESPIRATORY_TRACT | 0 refills | Status: DC | PRN

## 2016-11-15 MED ORDER — PREDNISONE 20 MG PO TABS
60.0000 mg | ORAL_TABLET | Freq: Once | ORAL | Status: AC
  Administered 2016-11-15: 60 mg via ORAL
  Filled 2016-11-15: qty 3

## 2016-11-15 MED ORDER — IOPAMIDOL (ISOVUE-370) INJECTION 76%
75.0000 mL | Freq: Once | INTRAVENOUS | Status: AC | PRN
  Administered 2016-11-15: 75 mL via INTRAVENOUS
  Filled 2016-11-15: qty 75

## 2016-11-15 MED ORDER — BENZONATATE 100 MG PO CAPS
ORAL_CAPSULE | ORAL | Status: AC
  Administered 2016-11-15: 200 mg via ORAL
  Filled 2016-11-15: qty 1

## 2016-11-15 MED ORDER — BENZONATATE 100 MG PO CAPS: 100.0000 mg | ORAL_CAPSULE | Freq: Four times a day (QID) | ORAL | 0 refills | Status: DC | PRN

## 2016-11-15 MED ORDER — BENZONATATE 100 MG PO CAPS
ORAL_CAPSULE | ORAL | Status: AC
  Filled 2016-11-15: qty 1

## 2016-11-15 NOTE — ED Triage Notes (Signed)
Pt reports coughing up blood and abdominal  pain for "months", intermittently coughs up blood. Pt c/o SOB that worsened last night. Wheezing present. No medications PTA. Nonproductive cough. Hx of asthma. Color WNL, RR even and unlabored.

## 2016-11-15 NOTE — ED Provider Notes (Signed)
[facility] Emergency Department Provider Note  ____________________________________________  Time seen: Approximately 3:51 PM  I have reviewed the triage vital signs and the nursing notes.   HISTORY  Chief Complaint Shortness of Breath and Abdominal Pain    HPI Justin Reid is a 36 y.o. male history of asthma and ongoing tobacco abuse presenting with wheezing, cough, hemoptysis, and shortness of breath.  The patient reports that for the last several months, he has had shortness of breath, which is worse with exertion and laying down at night.  He has not noticed any lower extremity edema.  Since last night, he has developed a cough productive of sputum, which occasionally has "flecks" of blood in it.  He has had a mild clear rhinorrhea without any sore throat or ear pain.  No fevers or chills.  His wheezing has worsened but he ran out of his albuterol inhaler.  He also reports that after his cough started, he developed a right lateral chest wall pain that is worse with deep breaths or when he coughs.  He denies any lightheadedness or syncope.  Past Medical History:  Diagnosis Date  . Asthma   . Hypertension     Patient Active Problem List   Diagnosis Date Noted  . Asthma 07/07/2016  . Asthma exacerbation 02/20/2016    History reviewed. No pertinent surgical history.  Current Outpatient Rx  . Order #: [id number] Class: Print  . Order #: [id number] Class: Print  . Order #: [id number] Class: Print  . Order #: [id number] Class: Print    Allergies Patient has no known allergies.  Family History  Problem Relation Age of Onset  . Hypertension Father     Social History Social History   Tobacco Use  . Smoking status: Current Every Day Smoker    Packs/day: 0.25    Years: 5.00    Pack years: 1.25    Types: Cigarettes  . Smokeless tobacco: Never Used  Substance Use Topics  . Alcohol use: Yes    Comment: occasionally  . Drug use: No    Review of  Systems Constitutional: No fever/chills.  No lightheadedness or syncope. Eyes: No visual changes.  No eye discharge. ENT: No sore throat.  Positive congestion with clear rhinorrhea.  No ear pain. Cardiovascular: Denies chest pain. Denies palpitations. Respiratory: Positive exertional and orthopnea related shortness of breath.  Positive cough with blood flecks. Gastrointestinal: No abdominal pain.  No nausea, no vomiting.  No diarrhea.  No constipation. Genitourinary: Negative for dysuria. Musculoskeletal: Negative for back pain.  No lower extremity swelling or calf pain. Skin: Negative for rash. Neurological: Negative for headaches. No focal numbness, tingling or weakness.     ____________________________________________   PHYSICAL EXAM:  VITAL SIGNS: ED Triage Vitals  Enc Vitals Group     BP 11/15/16 1031 (!) 142/91     Pulse Rate 11/15/16 1031 97     Resp 11/15/16 1031 (!) 24     Temp 11/15/16 1031 97.9 F (36.6 C)     Temp Source 11/15/16 1031 Oral     SpO2 11/15/16 1031 98 %     Weight 11/15/16 1032 265 lb (120.2 kg)     Height --      Head Circumference --      Peak Flow --      Pain Score 11/15/16 1031 9     Pain Loc --      Pain Edu? --      Excl. in GC? --  Constitutional: Alert and oriented.  The uncomfortable appearing but nontoxic. Answers questions appropriately. Eyes: Conjunctivae are normal.  EOMI. No scleral icterus.  Discharge. Head: Atraumatic. Nose: No congestion/rhinnorhea. Mouth/Throat: Mucous membranes are moist.  No posterior pharyngeal erythema, tonsillar swelling or exudate.  The posterior palate is symmetric and the uvula is midline.  There is no evidence of blood in the posterior pharynx. Neck: No stridor.  Supple.  No JVD.  No meningismus. Cardiovascular: Normal rate, regular rhythm. No murmurs, rubs or gallops.  Respiratory: The patient has a mild tachypnea without accessory muscle use or retractions.  He does have some audible wheezing and  intermittent cough.  No rales or rhonchi.  Positive mild prolonged expiratory phase.   Gastrointestinal: Obese.  Soft, nontender and nondistended.  No guarding or rebound.  No peritoneal signs. Musculoskeletal: No LE edema. No ttp in the calves or palpable cords.  Negative Homan's sign. Neurologic:  A&Ox3.  Speech is clear.  Face and smile are symmetric.  EOMI.  Moves all extremities well. Skin:  Skin is warm, dry and intact. No rash noted. Psychiatric: Mood and affect are normal. Speech and behavior are normal.  Normal judgement.  ____________________________________________   LABS (all labs ordered are listed, but only abnormal results are displayed)  Labs Reviewed  COMPREHENSIVE METABOLIC PANEL - Abnormal; Notable for the following components:      Result Value   Chloride 100 (*)    CO2 21 (*)    All other components within normal limits  LIPASE, BLOOD  CBC  TROPONIN I  BRAIN NATRIURETIC PEPTIDE  URINALYSIS, COMPLETE (UACMP) WITH MICROSCOPIC   ____________________________________________  EKG  ED ECG REPORT I, Rockne MenghiniNorman, Anne-Caroline, the attending physician, personally viewed and interpreted this ECG.   Date: 11/15/2016  EKG Time: 1035  Rate: 87  Rhythm: normal sinus rhythm  Axis: normal  Intervals:none  ST&T Change: No STEMI  ____________________________________________  RADIOLOGY  Dg Chest 2 View  Result Date: 11/15/2016 CLINICAL DATA:  Shortness of breath and hemoptysis.  Wheezing. EXAM: CHEST  2 VIEW COMPARISON:  October 04, 2016 FINDINGS: Lungs are clear. Heart size and pulmonary vascularity are normal. No adenopathy. No bone lesions. 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 IMPRESSION: No edema or consolidation. 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 Electronically Signed   By: Bretta BangWilliam  Woodruff III M.D.   On: 11/15/2016 11:22   Ct Angio Chest Pe W And/or Wo Contrast  Result Date: 11/15/2016 CLINICAL  DATA:  Shortness of breath and wheezing. Intermittent hemoptysis. EXAM: CT ANGIOGRAPHY CHEST WITH CONTRAST TECHNIQUE: Multidetector CT imaging of the chest was performed using the standard protocol during bolus administration of intravenous contrast. Multiplanar CT image reconstructions and MIPs were obtained to evaluate the vascular anatomy. CONTRAST:  75mL ISOVUE-370 IOPAMIDOL (ISOVUE-370) INJECTION 76% COMPARISON:  Chest x-ray dated 11/15/2016 FINDINGS: Cardiovascular: Satisfactory opacification of the pulmonary arteries to the segmental level. No evidence of pulmonary embolism. Normal heart size. No pericardial effusion. Congenital aberrant right subclavian artery. Mediastinum/Nodes: No enlarged mediastinal, hilar, or axillary lymph nodes. Thyroid gland, trachea, and esophagus demonstrate no significant findings. Lungs/Pleura: Lungs are clear. No pleural effusion or pneumothorax. Upper Abdomen: 2.4 cm cyst on the upper pole of the left kidney. Otherwise negative. Musculoskeletal: No chest wall abnormality. No acute or significant osseous findings. Review of the MIP images confirms the above findings. IMPRESSION: No pulmonary emboli or other significant abnormalities. Electronically Signed   By: Francene BoyersJames  Maxwell M.D.   On: 11/15/2016 16:49  ____________________________________________   PROCEDURES  Procedure(s) performed: None  Procedures  Critical Care performed: No ____________________________________________   INITIAL IMPRESSION / ASSESSMENT AND PLAN / ED COURSE  Pertinent labs & imaging results that were available during my care of the patient were reviewed by me and considered in my medical decision making (see chart for details).  36 y.o. male with a history of asthma presenting with several months of progressively worsening exertional shortness of breath and orthopnea, now with an acute cough with clear rhinorrhea, and blood specks in his phlegm.  Overall, the patient is mildly  hypertensive but otherwise hemodynamically stable and has an O2 sat of 100% on my examination.  He does have some wheezing, and will get treatment for an asthma exacerbation, which I think is triggered by a viral URI.  His chest x-ray and physical exam are not indicative of a pneumonia.  The patient will also receive steroids.  However, given the patient's chest pain and mild hemoptysis, will get a CT scan to rule out PE in this patient.  Plan reevaluation for final disposition.  ----------------------------------------- 5:58 PM on 11/15/2016 -----------------------------------------  The patient is feeling significantly better at this time and on my reexamination, his wheezing has resolved.  He continues to have some occasional coughing spasm, so we will give him another albuterol neb treatment, and a Tessalon Perle.  The patient's workup in the emergency department has been reassuring and his CT angiogram does not show any blood clot or other acute abnormality.  Again, the most likely cause for the patient's symptoms is a viral URI causing an asthma exacerbation.  I will plan to discharge him home with prednisone and a prescription for an albuterol inhaler, as well as a Z-Pak.  If he does not have a primary care physician but I will give him the number for the current oral clinic for follow-up.  I discussed the patient's results with him, and he understands follow-up instructions as well as return precautions. ____________________________________________  FINAL CLINICAL IMPRESSION(S) / ED DIAGNOSES  Final diagnoses:  Right-sided chest pain  Moderate persistent asthma with exacerbation  Upper respiratory tract infection, unspecified type  Hemoptysis         NEW MEDICATIONS STARTED DURING THIS VISIT:  This SmartLink is deprecated. Use AVSMEDLIST instead to display the medication list for a patient.    Rockne MenghiniNorman, Anne-Caroline, MD 11/15/16 1800

## 2016-11-15 NOTE — ED Notes (Signed)
Ambulatory pulse sats 100% on room air

## 2016-11-15 NOTE — Discharge Instructions (Signed)
Please drink plenty of fluids stay well-hydrated, and take the entire course of steroids and antibiotics even if you are feeling better.  Please wash your hands thoroughly and frequently to prevent the spread of infection.  Return to the emergency department if you develop severe pain, shortness of breath, lightheadedness or fainting, fever, or any other symptoms concerning to you.

## 2017-01-26 ENCOUNTER — Emergency Department: Payer: Self-pay

## 2017-01-26 ENCOUNTER — Other Ambulatory Visit: Payer: Self-pay

## 2017-01-26 ENCOUNTER — Encounter: Payer: Self-pay | Admitting: Emergency Medicine

## 2017-01-26 ENCOUNTER — Emergency Department
Admission: EM | Admit: 2017-01-26 | Discharge: 2017-01-26 | Disposition: A | Discharge status: Home / Self Care | Payer: Self-pay | Attending: Emergency Medicine | Admitting: Emergency Medicine

## 2017-01-26 DIAGNOSIS — Z79899 Other long term (current) drug therapy: Secondary | ICD-10-CM | POA: Insufficient documentation

## 2017-01-26 DIAGNOSIS — F17210 Nicotine dependence, cigarettes, uncomplicated: Secondary | ICD-10-CM | POA: Insufficient documentation

## 2017-01-26 DIAGNOSIS — J45909 Unspecified asthma, uncomplicated: Secondary | ICD-10-CM | POA: Insufficient documentation

## 2017-01-26 DIAGNOSIS — I10 Essential (primary) hypertension: Secondary | ICD-10-CM | POA: Insufficient documentation

## 2017-01-26 DIAGNOSIS — J4521 Mild intermittent asthma with (acute) exacerbation: Secondary | ICD-10-CM | POA: Insufficient documentation

## 2017-01-26 LAB — BASIC METABOLIC PANEL
Anion gap: 7 (ref 5–15)
BUN: 15 mg/dL (ref 6–20)
CO2: 28 mmol/L (ref 22–32)
Calcium: 9.6 mg/dL (ref 8.9–10.3)
Chloride: 102 mmol/L (ref 101–111)
Creatinine, Ser: 0.99 mg/dL (ref 0.61–1.24)
Glucose, Bld: 92 mg/dL (ref 65–99)
POTASSIUM: 4.1 mmol/L (ref 3.5–5.1)
Sodium: 137 mmol/L (ref 135–145)

## 2017-01-26 LAB — CBC WITH DIFFERENTIAL/PLATELET
Basophils Absolute: 0 10*3/uL (ref 0–0.1)
Basophils Relative: 0 %
Eosinophils Absolute: 0.2 10*3/uL (ref 0–0.7)
Eosinophils Relative: 3 %
HEMATOCRIT: 39.7 % — AB (ref 40.0–52.0)
HEMOGLOBIN: 12.9 g/dL — AB (ref 13.0–18.0)
LYMPHS ABS: 1.9 10*3/uL (ref 1.0–3.6)
Lymphocytes Relative: 36 %
MCH: 27 pg (ref 26.0–34.0)
MCHC: 32.5 g/dL (ref 32.0–36.0)
MCV: 83.1 fL (ref 80.0–100.0)
Monocytes Absolute: 0.4 10*3/uL (ref 0.2–1.0)
Monocytes Relative: 7 %
NEUTROS ABS: 2.8 10*3/uL (ref 1.4–6.5)
NEUTROS PCT: 54 %
Platelets: 340 10*3/uL (ref 150–440)
RBC: 4.78 MIL/uL (ref 4.40–5.90)
RDW: 14.1 % (ref 11.5–14.5)
WBC: 5.3 10*3/uL (ref 3.8–10.6)

## 2017-01-26 LAB — TROPONIN I

## 2017-01-26 MED ORDER — ALBUTEROL SULFATE HFA 108 (90 BASE) MCG/ACT IN AERS: 2.0000 | INHALATION_SPRAY | Freq: Four times a day (QID) | RESPIRATORY_TRACT | 0 refills | Status: DC | PRN

## 2017-01-26 MED ORDER — PREDNISONE 10 MG (21) PO TBPK: ORAL_TABLET | ORAL | 0 refills | Status: DC

## 2017-01-26 MED ORDER — ALBUTEROL SULFATE (2.5 MG/3ML) 0.083% IN NEBU
INHALATION_SOLUTION | RESPIRATORY_TRACT | Status: AC
  Filled 2017-01-26: qty 6

## 2017-01-26 MED ORDER — ALBUTEROL SULFATE (2.5 MG/3ML) 0.083% IN NEBU
5.0000 mg | INHALATION_SOLUTION | Freq: Once | RESPIRATORY_TRACT | Status: AC
  Administered 2017-01-26: 5 mg via RESPIRATORY_TRACT

## 2017-01-26 NOTE — ED Triage Notes (Signed)
Pt here for asthma exacerbation. Diminished air movement bilateral with barely audible wheezing r/t decreased sounds.  Mildly labored currently. sats WNL.  Ran out of inhaler at home. Had had yellow productive cough and chest pain per pt.

## 2017-01-26 NOTE — Discharge Instructions (Signed)
Please seek medical attention for any high fevers, chest pain, shortness of breath, change in behavior, persistent vomiting, bloody stool or any other new or concerning symptoms.  

## 2017-01-26 NOTE — ED Provider Notes (Signed)
[facility] Emergency Department Provider Note   ____________________________________________   I have reviewed the triage vital signs and the nursing notes.   HISTORY  Chief Complaint Shortness of Breath   History limited by: Not Limited   HPI Justin Reid is a 37 y.o. male who presents to the emergency department today because of concerns for shortness of breath.  He states that the symptoms started last night.  They have been persistent since then.  They have been accompanied by a cough.  Patient has had some chest pain.  Patient denies any fevers.  Patient does state he has a history of asthma.  This feels like an asthma exacerbation.  Patient ran out of his albuterol inhaler.  By the time my examination he stated he felt better.  He had received a nebulizer treatment prior to my examination.  At the time my examination he states he feels better and feels comfortable going home.   Per medical record review patient has a history of asthma  Past Medical History:  Diagnosis Date  . Asthma   . Hypertension     Patient Active Problem List   Diagnosis Date Noted  . Asthma 07/07/2016  . Asthma exacerbation 02/20/2016    History reviewed. No pertinent surgical history.  Prior to Admission medications   Medication Sig Start Date End Date Taking? Authorizing Provider  albuterol (PROVENTIL HFA;VENTOLIN HFA) 108 (90 Base) MCG/ACT inhaler Inhale 2 puffs every 4 (four) hours as needed into the lungs for wheezing or shortness of breath. 11/15/16   Rockne Menghini, MD  benzonatate (TESSALON PERLES) 100 MG capsule Take 1 capsule (100 mg total) every 6 (six) hours as needed by mouth for cough. 11/15/16   Rockne Menghini, MD  predniSONE (DELTASONE) 20 MG tablet Take 3 tablets (60 mg total) daily by mouth. 11/15/16   Rockne Menghini, MD    Allergies Patient has no known allergies.  Family History  Problem Relation Age of Onset  .  Hypertension Father     Social History Social History   Tobacco Use  . Smoking status: Current Every Day Smoker    Packs/day: 0.25    Years: 5.00    Pack years: 1.25    Types: Cigarettes  . Smokeless tobacco: Never Used  Substance Use Topics  . Alcohol use: Yes    Comment: occasionally  . Drug use: No    Review of Systems Constitutional: No fever/chills Eyes: No visual changes. ENT: No sore throat. Cardiovascular: Positive chest pain. Respiratory: Positive shortness of breath. Gastrointestinal: No abdominal pain.  No nausea, no vomiting.  No diarrhea.   Genitourinary: Negative for dysuria. Musculoskeletal: Negative for back pain. Skin: Negative for rash. Neurological: Negative for headaches, focal weakness or numbness.  ____________________________________________   PHYSICAL EXAM:  VITAL SIGNS: ED Triage Vitals  Enc Vitals Group     BP 01/26/17 1402 (!) 142/96     Pulse Rate 01/26/17 1357 99     Resp 01/26/17 1357 (!) 26     Temp 01/26/17 1357 98.2 F (36.8 C)     Temp Source 01/26/17 1357 Oral     SpO2 01/26/17 1357 98 %     Weight 01/26/17 1358 265 lb (120.2 kg)     Height 01/26/17 1358 5\' 9"  (1.753 m)     Head Circumference --      Peak Flow --      Pain Score 01/26/17 1358 8     Pain Loc --  Pain Edu? --      Excl. in GC? --      Constitutional: Alert and oriented. Well appearing and in no distress. Eyes: Conjunctivae are normal.  ENT   Head: Normocephalic and atraumatic.   Nose: No congestion/rhinnorhea.   Mouth/Throat: Mucous membranes are moist.   Neck: No stridor. Hematological/Lymphatic/Immunilogical: No cervical lymphadenopathy. Cardiovascular: Normal rate, regular rhythm.  No murmurs, rubs, or gallops.  Respiratory: Normal respiratory effort without tachypnea nor retractions. Breath sounds are clear and equal bilaterally. No wheezes/rales/rhonchi. Gastrointestinal: Soft and non tender. No rebound. No guarding.   Genitourinary: Deferred Musculoskeletal: Normal range of motion in all extremities. No lower extremity edema. Neurologic:  Normal speech and language. No gross focal neurologic deficits are appreciated.  Skin:  Skin is warm, dry and intact. No rash noted. Psychiatric: Mood and affect are normal. Speech and behavior are normal. Patient exhibits appropriate insight and judgment.  ____________________________________________    LABS (pertinent positives/negatives)  Trop <0.03 BMP wnl CBC wbc 5.3, hgb 12.0, plt 340  ____________________________________________   EKG  I, Phineas SemenGraydon Jezabel Lecker, attending physician, personally viewed and interpreted this EKG  EKG Time: 1402 Rate: 93 Rhythm: normal sinus rhythm Axis: normal Intervals: qtc 437 QRS: narrow ST changes: no st elevation Impression: normal ekg  ____________________________________________    RADIOLOGY  CXR Reactive airway disease, no pneumonia   ____________________________________________   PROCEDURES  Procedures  ____________________________________________   INITIAL IMPRESSION / ASSESSMENT AND PLAN / ED COURSE  Pertinent labs & imaging results that were available during my care of the patient were reviewed by me and considered in my medical decision making (see chart for details).  Patient here with asthma exacerbation.  By the time my exam he feels a lot better.  Chest x-ray without any signs of pneumonia.  Blood work and EKG also non-concerning for other etiologies of the shortness of breath.  Patient felt comfortable going home at the time my examination.  I do think this is reasonable.  He is not in any respiratory distress.  However given the slight exacerbation will start patient on steroids.  Will refill patient's albuterol inhaler prescription.  Discussed findings and plan with patient.   ____________________________________________   FINAL CLINICAL IMPRESSION(S) / ED DIAGNOSES  Final  diagnoses:  Mild intermittent asthma with exacerbation     Note: This dictation was prepared with Dragon dictation. Any transcriptional errors that result from this process are unintentional     Phineas SemenGoodman, Rodolph Hagemann, MD 01/26/17 [phone]

## 2017-02-04 ENCOUNTER — Emergency Department: Payer: Self-pay

## 2017-02-04 ENCOUNTER — Other Ambulatory Visit: Payer: Self-pay

## 2017-02-04 ENCOUNTER — Emergency Department
Admission: EM | Admit: 2017-02-04 | Discharge: 2017-02-04 | Disposition: A | Discharge status: Home / Self Care | Payer: Self-pay | Attending: Emergency Medicine | Admitting: Emergency Medicine

## 2017-02-04 DIAGNOSIS — I10 Essential (primary) hypertension: Secondary | ICD-10-CM | POA: Insufficient documentation

## 2017-02-04 DIAGNOSIS — Z79899 Other long term (current) drug therapy: Secondary | ICD-10-CM | POA: Insufficient documentation

## 2017-02-04 DIAGNOSIS — F17210 Nicotine dependence, cigarettes, uncomplicated: Secondary | ICD-10-CM | POA: Insufficient documentation

## 2017-02-04 DIAGNOSIS — J4521 Mild intermittent asthma with (acute) exacerbation: Secondary | ICD-10-CM | POA: Insufficient documentation

## 2017-02-04 MED ORDER — METHYLPREDNISOLONE SODIUM SUCC 125 MG IJ SOLR
125.0000 mg | Freq: Once | INTRAMUSCULAR | Status: AC
  Administered 2017-02-04: 125 mg via INTRAVENOUS
  Filled 2017-02-04: qty 2

## 2017-02-04 MED ORDER — IPRATROPIUM-ALBUTEROL 0.5-2.5 (3) MG/3ML IN SOLN
3.0000 mL | Freq: Once | RESPIRATORY_TRACT | Status: AC
  Administered 2017-02-04: 3 mL via RESPIRATORY_TRACT
  Filled 2017-02-04: qty 3

## 2017-02-04 MED ORDER — ALBUTEROL SULFATE HFA 108 (90 BASE) MCG/ACT IN AERS: 2.0000 | INHALATION_SPRAY | Freq: Four times a day (QID) | RESPIRATORY_TRACT | 2 refills | Status: DC | PRN

## 2017-02-04 MED ORDER — ALBUTEROL SULFATE (2.5 MG/3ML) 0.083% IN NEBU
5.0000 mg | INHALATION_SOLUTION | Freq: Once | RESPIRATORY_TRACT | Status: AC
  Administered 2017-02-04: 5 mg via RESPIRATORY_TRACT

## 2017-02-04 MED ORDER — ALBUTEROL SULFATE (2.5 MG/3ML) 0.083% IN NEBU
INHALATION_SOLUTION | RESPIRATORY_TRACT | Status: AC
  Administered 2017-02-04: 14:00:00
  Filled 2017-02-04: qty 6

## 2017-02-04 MED ORDER — ALBUTEROL SULFATE (2.5 MG/3ML) 0.083% IN NEBU
INHALATION_SOLUTION | RESPIRATORY_TRACT | Status: AC
  Filled 2017-02-04: qty 3

## 2017-02-04 MED ORDER — PREDNISONE 20 MG PO TABS: 60.0000 mg | ORAL_TABLET | Freq: Every day | ORAL | 0 refills | Status: AC

## 2017-02-04 MED ORDER — SODIUM CHLORIDE 0.9 % IV BOLUS (SEPSIS)
500.0000 mL | Freq: Once | INTRAVENOUS | Status: AC
Start: 2017-02-04 — End: 2017-02-04
  Administered 2017-02-04: 500 mL via INTRAVENOUS

## 2017-02-04 NOTE — ED Notes (Signed)
Report received from Bill RN 

## 2017-02-04 NOTE — ED Provider Notes (Signed)
[facility] Emergency Department Provider Note ____________________________________________   First MD Initiated Contact with Patient 02/04/17 1351     (approximate)  I have reviewed the triage vital signs and the nursing notes.   HISTORY  Chief Complaint Shortness of Breath    HPI Justin Reid is a 37 y.o. male with a past medical history of asthma and hypertension who presents with shortness of breath for the last 2 days, gradual onset, worsening course, associated with cough and wheezing, and identical to prior asthma exacerbations.  Patient states it is not being relieved by albuterol at home; he states he is not taking other medications at this time.  He states he has been admitted in the past for asthma, but states he does not feel that bad.  He was seen in the ED 8 days ago for asthma and improved with albuterol; he states he did not quite return to his baseline before this exacerbation started.  Past Medical History:  Diagnosis Date  . Asthma   . Hypertension     Patient Active Problem List   Diagnosis Date Noted  . Asthma 07/07/2016  . Asthma exacerbation 02/20/2016    History reviewed. No pertinent surgical history.  Prior to Admission medications   Medication Sig Start Date End Date Taking? Authorizing Provider  predniSONE (STERAPRED UNI-PAK 21 TAB) 10 MG (21) TBPK tablet Per packaging instructions 01/26/17  Yes Phineas Semen, MD  albuterol (PROVENTIL HFA;VENTOLIN HFA) 108 (90 Base) MCG/ACT inhaler Inhale 2 puffs into the lungs every 6 (six) hours as needed for wheezing or shortness of breath. 01/26/17   Phineas Semen, MD  benzonatate (TESSALON PERLES) 100 MG capsule Take 1 capsule (100 mg total) every 6 (six) hours as needed by mouth for cough. Patient not taking: Reported on 02/04/2017 11/15/16   Rockne Menghini, MD  predniSONE (DELTASONE) 20 MG tablet Take 3 tablets (60 mg total) daily by mouth. Patient not taking: Reported on  02/04/2017 11/15/16   Rockne Menghini, MD    Allergies Patient has no known allergies.  Family History  Problem Relation Age of Onset  . Hypertension Father     Social History Social History   Tobacco Use  . Smoking status: Current Every Day Smoker    Packs/day: 0.25    Years: 5.00    Pack years: 1.25    Types: Cigarettes  . Smokeless tobacco: Never Used  Substance Use Topics  . Alcohol use: Yes    Comment: occasionally  . Drug use: No    Review of Systems  Constitutional: No fever. Eyes: No redness. ENT: No sore throat. Cardiovascular: Denies chest pain. Respiratory: Positive for shortness of breath. Gastrointestinal: No vomiting or diarrhea.  Genitourinary: Negative for flank pain.  Musculoskeletal: Negative for back pain. Skin: Negative for rash. Neurological: Negative for headache.   ____________________________________________   PHYSICAL EXAM:  VITAL SIGNS: ED Triage Vitals  Enc Vitals Group     BP 02/04/17 1332 136/88     Pulse Rate 02/04/17 1332 99     Resp 02/04/17 1332 (!) 25     Temp 02/04/17 1332 98.5 F (36.9 C)     Temp Source 02/04/17 1332 Oral     SpO2 02/04/17 1332 98 %     Weight 02/04/17 1333 265 lb (120.2 kg)     Height 02/04/17 1333 5\' 9"  (1.753 m)     Head Circumference --      Peak Flow --      Pain Score --  Pain Loc --      Pain Edu? --      Excl. in GC? --     Constitutional: Alert and oriented. Well appearing and in no acute distress. Eyes: Conjunctivae are normal.  Head: Atraumatic. Nose: No congestion/rhinnorhea. Mouth/Throat: Mucous membranes are slightly dry.   Neck: Normal range of motion.  Cardiovascular: Normal rate, regular rhythm. Grossly normal heart sounds.  Good peripheral circulation. Respiratory: Normal respiratory effort.  No retractions.  Decreased breath sounds bilaterally with prolonged expiratory phase and faint wheezing in bilateral bases.. Gastrointestinal: Soft and nontender. No  distention.  Genitourinary: No flank tenderness. Musculoskeletal: No lower extremity edema.  Extremities warm and well perfused.  Neurologic:  Normal speech and language. No gross focal neurologic deficits are appreciated.  Skin:  Skin is warm and dry. No rash noted. Psychiatric: Mood and affect are normal. Speech and behavior are normal.  ____________________________________________   LABS (all labs ordered are listed, but only abnormal results are displayed)  Labs Reviewed - No data to display ____________________________________________  EKG  ED ECG REPORT I, Dionne BucySebastian Annisten Manchester, the attending physician, personally viewed and interpreted this ECG.  Date: 02/04/2017 EKG Time: 1335 Rate: 98 Rhythm: normal sinus rhythm QRS Axis: normal Intervals: normal ST/T Wave abnormalities: Normal Narrative Interpretation: no evidence of acute ischemia; no significant change when compared to EKG of 01/26/2017  ____________________________________________  RADIOLOGY  CXR: No focal infiltrate or other acute findings  ____________________________________________   PROCEDURES  Procedure(s) performed: No  Procedures  Critical Care performed: No ____________________________________________   INITIAL IMPRESSION / ASSESSMENT AND PLAN / ED COURSE  Pertinent labs & imaging results that were available during my care of the patient were reviewed by me and considered in my medical decision making (see chart for details).  37 year old male with past medical history of asthma and hypertension presents with shortness of breath, cough, and wheezing over the last 2 days consistent with prior asthma.  He has been taking albuterol at home.  On review of past medical records in Epic, the patient was seen in the ED on 01/26/2017 for asthma, and improved after albuterol and went home.  He states he did not quite return to his baseline but was generally feeling better and then  the symptoms returned  more abruptly the last 2 days.   On exam, the patient has no respiratory distress, his vital signs are normal, and there is mild bilateral wheezing and decreased breath sounds.  Presentation overall is consistent with asthma versus possible viral bronchitis.  Plan for chest x-ray, nebs and steroid, and reassess.  ----------------------------------------- 4:07 PM on 02/04/2017 -----------------------------------------  Patient is feeling much better, would like to go home.  O2 sat remains in the mid to high 90s on room air.  Patient has no respiratory distress.  I will discharge with prescription for steroids, as well as a new albuterol inhaler prescription, as the patient states that he lost his.  Return precautions given, and the patient expresses understanding.      ____________________________________________   FINAL CLINICAL IMPRESSION(S) / ED DIAGNOSES  Final diagnoses:  Mild intermittent asthma with exacerbation      NEW MEDICATIONS STARTED DURING THIS VISIT:  New Prescriptions   No medications on file     Note:  This document was prepared using Dragon voice recognition software and may include unintentional dictation errors.    Dionne BucySiadecki, Desa Rech, MD 02/04/17 [id number]

## 2017-02-04 NOTE — Discharge Instructions (Signed)
Return to the ER for new, worsening, or persistent to difficulty breathing, chest pain, weakness or lightheadedness, fevers, or any other new or worsening symptoms that concern you.

## 2017-02-04 NOTE — ED Notes (Signed)
Patient was encouraged to finish nebulizer treatment. Family at bedside.

## 2017-02-04 NOTE — ED Triage Notes (Signed)
Pt presents for asthma exacerbation Dimished air movement with audible wheezing. Midly labored No rescue inhaler at home ran out.l

## 2017-02-04 NOTE — ED Notes (Signed)
Pt appears shob, pursed lip breathing, sats 96-97% on RA.

## 2017-02-24 ENCOUNTER — Encounter: Payer: Self-pay | Admitting: Emergency Medicine

## 2017-02-24 ENCOUNTER — Emergency Department
Admission: EM | Admit: 2017-02-24 | Discharge: 2017-02-24 | Disposition: A | Discharge status: Home / Self Care | Payer: Self-pay | Attending: Emergency Medicine | Admitting: Emergency Medicine

## 2017-02-24 ENCOUNTER — Other Ambulatory Visit: Payer: Self-pay

## 2017-02-24 DIAGNOSIS — M7581 Other shoulder lesions, right shoulder: Secondary | ICD-10-CM | POA: Insufficient documentation

## 2017-02-24 DIAGNOSIS — F17210 Nicotine dependence, cigarettes, uncomplicated: Secondary | ICD-10-CM | POA: Insufficient documentation

## 2017-02-24 DIAGNOSIS — S39012A Strain of muscle, fascia and tendon of lower back, initial encounter: Secondary | ICD-10-CM | POA: Insufficient documentation

## 2017-02-24 DIAGNOSIS — X500XXA Overexertion from strenuous movement or load, initial encounter: Secondary | ICD-10-CM | POA: Insufficient documentation

## 2017-02-24 DIAGNOSIS — Y9389 Activity, other specified: Secondary | ICD-10-CM | POA: Insufficient documentation

## 2017-02-24 DIAGNOSIS — Y990 Civilian activity done for income or pay: Secondary | ICD-10-CM | POA: Insufficient documentation

## 2017-02-24 DIAGNOSIS — G8929 Other chronic pain: Secondary | ICD-10-CM | POA: Insufficient documentation

## 2017-02-24 DIAGNOSIS — Y9289 Other specified places as the place of occurrence of the external cause: Secondary | ICD-10-CM | POA: Insufficient documentation

## 2017-02-24 DIAGNOSIS — I10 Essential (primary) hypertension: Secondary | ICD-10-CM | POA: Insufficient documentation

## 2017-02-24 DIAGNOSIS — J45909 Unspecified asthma, uncomplicated: Secondary | ICD-10-CM | POA: Insufficient documentation

## 2017-02-24 DIAGNOSIS — M25511 Pain in right shoulder: Secondary | ICD-10-CM | POA: Insufficient documentation

## 2017-02-24 MED ORDER — MELOXICAM 15 MG PO TABS: 15.0000 mg | ORAL_TABLET | Freq: Every day | ORAL | 1 refills | Status: AC

## 2017-02-24 NOTE — ED Notes (Signed)
Pt ambulatory upon discharge. Verbalized understanding of discharge instructions, follow-up care and prescription. Skin warm and dry. A&O x4.

## 2017-02-24 NOTE — ED Triage Notes (Signed)
Pt with lower back and shoulder pain. Pt with chronic back and shoulder pain and says that its been hurting bad for about 2 weeks. Pain increases when laying down. Denies chest pain, sob. Pain 9/10

## 2017-02-24 NOTE — ED Provider Notes (Signed)
[facility] Emergency Department Provider Note  ____________________________________________  Time seen: Approximately 3:19 PM  I have reviewed the triage vital signs and the nursing notes.   HISTORY  Chief Complaint Back Pain and Shoulder Pain    HPI Justin Reid is a 37 y.o. male presents to the emergency department with chronic right shoulder and low back pain.  Patient reports that chronic right shoulder and low back pain has worsened in intensity over the past 2 weeks.  Patient reports a history of heavy lifting at work.  He reports that he takes no medicines for chronic conditions.  Patient's pain is worsened when he is lying in a supine position and relieved with ambulation.  He currently rates his pain as 9 out of 10 in intensity.  He characterizes it as nonradiating and aching.   Past Medical History:  Diagnosis Date  . Asthma   . Hypertension     Patient Active Problem List   Diagnosis Date Noted  . Asthma 07/07/2016  . Asthma exacerbation 02/20/2016    History reviewed. No pertinent surgical history.  Prior to Admission medications   Medication Sig Start Date End Date Taking? Authorizing Provider  albuterol (PROVENTIL HFA;VENTOLIN HFA) 108 (90 Base) MCG/ACT inhaler Inhale 2 puffs into the lungs every 6 (six) hours as needed for wheezing or shortness of breath. 02/04/17   Dionne BucySiadecki, Sebastian, MD  benzonatate (TESSALON PERLES) 100 MG capsule Take 1 capsule (100 mg total) every 6 (six) hours as needed by mouth for cough. Patient not taking: Reported on 02/04/2017 11/15/16   Rockne MenghiniNorman, Anne-Caroline, MD  meloxicam (MOBIC) 15 MG tablet Take 1 tablet (15 mg total) by mouth daily for 7 days. 02/24/17 03/03/17  Orvil FeilWoods, Morey Andonian M, PA-C    Allergies Patient has no known allergies.  Family History  Problem Relation Age of Onset  . Hypertension Father     Social History Social History   Tobacco Use  . Smoking status: Current Every Day Smoker   Packs/day: 0.25    Years: 5.00    Pack years: 1.25    Types: Cigarettes  . Smokeless tobacco: Never Used  Substance Use Topics  . Alcohol use: Yes    Comment: occasionally  . Drug use: No     Review of Systems  Constitutional: No fever/chills Eyes: No visual changes. No discharge ENT: No upper respiratory complaints. Cardiovascular: no chest pain. Respiratory: no cough. No SOB. Gastrointestinal: No abdominal pain.  No nausea, no vomiting.  No diarrhea.  No constipation. Musculoskeletal: Patient has right shoulder and low back pain.  Skin: Negative for rash, abrasions, lacerations, ecchymosis. Neurological: Negative for headaches, focal weakness or numbness.   ____________________________________________   PHYSICAL EXAM:  VITAL SIGNS: ED Triage Vitals  Enc Vitals Group     BP 02/24/17 1411 (!) 149/87     Pulse Rate 02/24/17 1411 80     Resp 02/24/17 1517 18     Temp 02/24/17 1411 98.7 F (37.1 C)     Temp Source 02/24/17 1411 Oral     SpO2 02/24/17 1411 98 %     Weight 02/24/17 1412 260 lb (117.9 kg)     Height 02/24/17 1412 5\' 10"  (1.778 m)     Head Circumference --      Peak Flow --      Pain Score 02/24/17 1412 8     Pain Loc --      Pain Edu? --      Excl. in GC? --  Constitutional: Alert and oriented. Well appearing and in no acute distress. Eyes: Conjunctivae are normal. PERRL. EOMI. Head: Atraumatic. Cardiovascular: Normal rate, regular rhythm. Normal S1 and S2.  Good peripheral circulation. Respiratory: Normal respiratory effort without tachypnea or retractions. Lungs CTAB. Good air entry to the bases with no decreased or absent breath sounds. Musculoskeletal: Patient has no paraspinal muscle tenderness or midline lumbar spinal tenderness.  Negative straight leg raise bilaterally.  Patient had pain elicited with rotator cuff testing, right.  No weakness was elicited with right rotator cuff testing.  Patient was able to perform full range of internal  and external rotation at the right shoulder.  Palpable radial pulse, right. Neurologic:  Normal speech and language. No gross focal neurologic deficits are appreciated.  Skin:  Skin is warm, dry and intact. No rash noted. Psychiatric: Mood and affect are normal. Speech and behavior are normal. Patient exhibits appropriate insight and judgement.   ____________________________________________   LABS (all labs ordered are listed, but only abnormal results are displayed)  Labs Reviewed - No data to display ____________________________________________  EKG   ____________________________________________  RADIOLOGY   No results found.  ____________________________________________    PROCEDURES  Procedure(s) performed:    Procedures    Medications - No data to display   ____________________________________________   INITIAL IMPRESSION / ASSESSMENT AND PLAN / ED COURSE  Pertinent labs & imaging results that were available during my care of the patient were reviewed by me and considered in my medical decision making (see chart for details).  Review of the [place] CSRS was performed in accordance of the [facility] prior to dispensing any controlled drugs.     Assessment and plan Lumbar strain Rotator cuff tendinitis Original differential diagnosis included lumbar strain, sciatica, herniated disc and muscle spasm as well as rotator cuff tendinitis, adhesive capsulitis, rotator cuff tear and biceps tendinitis.  History and physical exam findings are consistent with lumbar strain and rotator cuff tendinitis at this time.  In the absence of falls or trauma, further workup with x-rays is not warranted at this time as patient has not attempted conservative measures such as anti-inflammatories.  Patient was discharged with meloxicam and was referred to orthopedics, Dr. Rosita Kea.  Patient was also advised to establish care with primary care to address hypertension noted at triage.  All patient  questions were answered.   ____________________________________________  FINAL CLINICAL IMPRESSION(S) / ED DIAGNOSES  Final diagnoses:  Strain of lumbar region, initial encounter  Tendinitis of right rotator cuff      NEW MEDICATIONS STARTED DURING THIS VISIT:  ED Discharge Orders        Ordered    meloxicam (MOBIC) 15 MG tablet  Daily     02/24/17 1510          This chart was dictated using voice recognition software/Dragon. Despite best efforts to proofread, errors can occur which can change the meaning. Any change was purely unintentional.    Orvil Feil, PA-C 02/24/17 1525    Myrna Blazer, MD 02/24/17 2242

## 2017-05-12 ENCOUNTER — Emergency Department: Payer: Self-pay

## 2017-05-12 ENCOUNTER — Emergency Department
Admission: EM | Admit: 2017-05-12 | Discharge: 2017-05-12 | Disposition: A | Discharge status: Home / Self Care | Payer: Self-pay | Attending: Emergency Medicine | Admitting: Emergency Medicine

## 2017-05-12 ENCOUNTER — Other Ambulatory Visit: Payer: Self-pay

## 2017-05-12 DIAGNOSIS — I10 Essential (primary) hypertension: Secondary | ICD-10-CM | POA: Insufficient documentation

## 2017-05-12 DIAGNOSIS — J45909 Unspecified asthma, uncomplicated: Secondary | ICD-10-CM | POA: Insufficient documentation

## 2017-05-12 DIAGNOSIS — R0602 Shortness of breath: Secondary | ICD-10-CM | POA: Insufficient documentation

## 2017-05-12 DIAGNOSIS — F17210 Nicotine dependence, cigarettes, uncomplicated: Secondary | ICD-10-CM | POA: Insufficient documentation

## 2017-05-12 DIAGNOSIS — R1033 Periumbilical pain: Secondary | ICD-10-CM | POA: Insufficient documentation

## 2017-05-12 LAB — HEPATIC FUNCTION PANEL
ALBUMIN: 4.2 g/dL (ref 3.5–5.0)
ALT: 19 U/L (ref 17–63)
AST: 25 U/L (ref 15–41)
Alkaline Phosphatase: 60 U/L (ref 38–126)
Bilirubin, Direct: <0.1 mg/dL — ABNORMAL LOW (ref 0.1–0.5)
TOTAL PROTEIN: 7.9 g/dL (ref 6.5–8.1)
Total Bilirubin: 0.4 mg/dL (ref 0.3–1.2)

## 2017-05-12 LAB — CBC
HCT: 41.5 % (ref 40.0–52.0)
HEMOGLOBIN: 13.9 g/dL (ref 13.0–18.0)
MCH: 27.6 pg (ref 26.0–34.0)
MCHC: 33.5 g/dL (ref 32.0–36.0)
MCV: 82.3 fL (ref 80.0–100.0)
Platelets: 401 10*3/uL (ref 150–440)
RBC: 5.04 MIL/uL (ref 4.40–5.90)
RDW: 14.5 % (ref 11.5–14.5)
WBC: 4.9 10*3/uL (ref 3.8–10.6)

## 2017-05-12 LAB — BASIC METABOLIC PANEL
ANION GAP: 11 (ref 5–15)
BUN: 15 mg/dL (ref 6–20)
CALCIUM: 9 mg/dL (ref 8.9–10.3)
CO2: 22 mmol/L (ref 22–32)
CREATININE: 0.78 mg/dL (ref 0.61–1.24)
Chloride: 102 mmol/L (ref 101–111)
GFR calc non Af Amer: >60 mL/min (ref >60)
Glucose, Bld: 119 mg/dL — ABNORMAL HIGH (ref 65–99)
Potassium: 3.9 mmol/L (ref 3.5–5.1)
Sodium: 135 mmol/L (ref 135–145)

## 2017-05-12 LAB — TROPONIN I: Troponin I: <0.03 ng/mL (ref <0.03)

## 2017-05-12 LAB — LIPASE, BLOOD: LIPASE: 29 U/L (ref 11–51)

## 2017-05-12 MED ORDER — ALBUTEROL SULFATE (2.5 MG/3ML) 0.083% IN NEBU
5.0000 mg | INHALATION_SOLUTION | Freq: Once | RESPIRATORY_TRACT | Status: AC
  Administered 2017-05-12: 5 mg via RESPIRATORY_TRACT
  Filled 2017-05-12: qty 6

## 2017-05-12 MED ORDER — ALBUTEROL SULFATE HFA 108 (90 BASE) MCG/ACT IN AERS: 2.0000 | INHALATION_SPRAY | Freq: Four times a day (QID) | RESPIRATORY_TRACT | 2 refills | Status: DC | PRN

## 2017-05-12 MED ORDER — FAMOTIDINE 20 MG PO TABS: 20.0000 mg | ORAL_TABLET | Freq: Two times a day (BID) | ORAL | 0 refills | Status: DC

## 2017-05-12 NOTE — ED Notes (Signed)
Called for pt again. Visitor states pt is still in the bathroom.

## 2017-05-12 NOTE — ED Notes (Signed)
Pt called for triage. Visitor with pt states pt is in bathroom.

## 2017-05-12 NOTE — ED Triage Notes (Signed)
Pt arrives alert, oriented, ambulatory. No distress noted. States this AM started with SOB (hx asthma, used inhaler this AM), abd pain, vomiting. States central abd pain. Still has belly organs. Sounds congested. Denies cough.

## 2017-05-12 NOTE — ED Provider Notes (Signed)
[facility] Emergency Department Provider Note  ____________________________________________  Time seen: Approximately 7:53 PM  I have reviewed the triage vital signs and the nursing notes.   HISTORY  Chief Complaint Shortness of Breath and Abdominal Pain    HPI Justin Reid is a 37 y.o. male with a history of asthma and hypertension who complains of periumbilical pain that started earlier today, intermittent, sharp, severe, no aggravating or alleviating factors. Now resolved. No vomiting diarrhea constipation fevers or chills. He also has some shortness of breath and nonproductive cough consistent with his usual asthma and not worse.      Past Medical History:  Diagnosis Date  . Asthma   . Hypertension      Patient Active Problem List   Diagnosis Date Noted  . Asthma 07/07/2016  . Asthma exacerbation 02/20/2016     History reviewed. No pertinent surgical history.   Prior to Admission medications   Medication Sig Start Date End Date Taking? Authorizing Provider  albuterol (PROVENTIL HFA;VENTOLIN HFA) 108 (90 Base) MCG/ACT inhaler Inhale 2 puffs into the lungs every 6 (six) hours as needed for wheezing or shortness of breath. 05/12/17   Sharman Cheek, MD  benzonatate (TESSALON PERLES) 100 MG capsule Take 1 capsule (100 mg total) every 6 (six) hours as needed by mouth for cough. Patient not taking: Reported on 02/04/2017 11/15/16   Rockne Menghini, MD  famotidine (PEPCID) 20 MG tablet Take 1 tablet (20 mg total) by mouth 2 (two) times daily. 05/12/17   Sharman Cheek, MD     Allergies Patient has no known allergies.   Family History  Problem Relation Age of Onset  . Hypertension Father     Social History Social History   Tobacco Use  . Smoking status: Current Every Day Smoker    Packs/day: 0.25    Years: 5.00    Pack years: 1.25    Types: Cigarettes  . Smokeless tobacco: Never Used  Substance Use Topics  . Alcohol use:  Yes    Comment: occasionally  . Drug use: No    Review of Systems  Constitutional:   No fever or chills.  ENT:   No sore throat. No rhinorrhea. Cardiovascular:   No chest pain or syncope. Respiratory:  positive as well as shortness of breath and nonproductive cough. Gastrointestinal:   positive as above abdominal pain without vomiting and diarrhea.  Musculoskeletal:   Negative for focal pain or swelling All other systems reviewed and are negative except as documented above in ROS and HPI.  ____________________________________________   PHYSICAL EXAM:  VITAL SIGNS: ED Triage Vitals  Enc Vitals Group     BP 05/12/17 1645 116/89     Pulse Rate 05/12/17 1645 77     Resp 05/12/17 1645 18     Temp 05/12/17 1645 98.2 F (36.8 C)     Temp Source 05/12/17 1645 Oral     SpO2 05/12/17 1645 96 %     Weight 05/12/17 1646 260 lb (117.9 kg)     Height 05/12/17 1646  (1.727 m)     Head Circumference --      Peak Flow --      Pain Score 05/12/17 1646 8     Pain Loc --      Pain Edu? --      Excl. in GC? --     Vital signs reviewed, nursing assessments reviewed.   Constitutional:   Alert and oriented. Well appearing and in no distress.  Eyes:   Conjunctivae are normal. EOMI. PERRL. ENT      Head:   Normocephalic and atraumatic.      Nose:   No congestion/rhinnorhea.       Mouth/Throat:   MMM, no pharyngeal erythema. No peritonsillar mass.       Neck:   No meningismus. Full ROM. Hematological/Lymphatic/Immunilogical:   No cervical lymphadenopathy. Cardiovascular:   RRR. Symmetric bilateral radial and DP pulses.  No murmurs.  Respiratory:   Normal respiratory effort without tachypnea/retractions.scant end expiratory wheezing. Prolonged expiratory phase with FEV1 maneuver. Gastrointestinal:   Soft and nontender. Non distended. There is no CVA tenderness.  No rebound, rigidity, or guarding.  Musculoskeletal:   Normal range of motion in all extremities. No joint effusions.  No  lower extremity tenderness.  No edema. Neurologic:   Normal speech and language.  Motor grossly intact. No acute focal neurologic deficits are appreciated.  Skin:    Skin is warm, dry and intact. No rash noted.  No petechiae, purpura, or bullae.  ____________________________________________    LABS (pertinent positives/negatives) (all labs ordered are listed, but only abnormal results are displayed) Labs Reviewed  BASIC METABOLIC PANEL - Abnormal; Notable for the following components:      Result Value   Glucose, Bld 119 (*)    All other components within normal limits  CBC  TROPONIN I  LIPASE, BLOOD  HEPATIC FUNCTION PANEL   ____________________________________________   EKG  Interpreted by me  Date: 05/12/2017  Rate: 78  Rhythm: normal sinus rhythm  QRS Axis: normal  Intervals: normal  ST/T Wave abnormalities: normal  Conduction Disutrbances: none  Narrative Interpretation: unremarkable      ____________________________________________    RADIOLOGY  Dg Chest 2 View  Result Date: 05/12/2017 CLINICAL DATA:  Shortness of breath and abdominal pain. EXAM: CHEST - 2 VIEW COMPARISON:  02/04/2017 FINDINGS: Lungs are hyperexpanded. The lungs are clear without focal pneumonia, edema, pneumothorax or pleural effusion. The cardiopericardial silhouette is within normal limits for size. The visualized bony structures of the thorax are intact. Probable intra-articular loose body left shoulder. IMPRESSION: 1. Stable.  No acute cardiopulmonary findings. Electronically Signed   By: Kennith Center M.D.   On: 05/12/2017 17:20    ____________________________________________   PROCEDURES Procedures  ____________________________________________  DIFFERENTIAL DIAGNOSIS   Asthma, intestinal gas, gerd  CLINICAL IMPRESSION / ASSESSMENT AND PLAN / ED COURSE  Pertinent labs & imaging results that were available during my care of the patient were reviewed by me and considered in my  medical decision making (see chart for details).    patient well-appearing no acute distress, reports his pain is much better and he feels fine and is ready to go. With his abdominal pain, went in to check LFTs but he doesn't want to wait. I'll follow-up and call him if anything abnormal appears. Lipase is normal, white blood cell count is normal. Exam is benign.Considering the patient's symptoms, medical history, and physical examination today, I have low suspicion for cholecystitis or biliary pathology, pancreatitis, perforation or bowel obstruction, hernia, intra-abdominal abscess, AAA or dissection, volvulus or intussusception, mesenteric ischemia, or appendicitis.  I think his shortness of breath and wheezing is related to his underlying asthma and running out of his albuterol inhaler.  Clinical Course as of May 13 1951  Thu May 12, 2017  1930 Needs wto work notes.    [PS]    Clinical Course User Index [PS] Sharman Cheek, MD     ____________________________________________   FINAL  CLINICAL IMPRESSION(S) / ED DIAGNOSES    Final diagnoses:  Shortness of breath  Periumbilical abdominal pain     ED Discharge Orders        Ordered    albuterol (PROVENTIL HFA;VENTOLIN HFA) 108 (90 Base) MCG/ACT inhaler  Every 6 hours PRN     05/12/17 1951    famotidine (PEPCID) 20 MG tablet  2 times daily     05/12/17 1951      Portions of this note were generated with dragon dictation software. Dictation errors may occur despite best attempts at proofreading.    Sharman Cheek, MD 05/12/17 [phone]

## 2017-05-12 NOTE — ED Notes (Signed)
Pt has abd pain since this am.  Pt reports nausea earlier today, none now.  No v/d.  No back pain  No urinary sx.  Pt alert.  Speech clear.

## 2017-07-12 ENCOUNTER — Other Ambulatory Visit: Payer: Self-pay

## 2017-07-12 DIAGNOSIS — I10 Essential (primary) hypertension: Secondary | ICD-10-CM | POA: Insufficient documentation

## 2017-07-12 DIAGNOSIS — J4521 Mild intermittent asthma with (acute) exacerbation: Secondary | ICD-10-CM | POA: Insufficient documentation

## 2017-07-12 DIAGNOSIS — F17210 Nicotine dependence, cigarettes, uncomplicated: Secondary | ICD-10-CM | POA: Insufficient documentation

## 2017-07-12 MED ORDER — ALBUTEROL SULFATE (2.5 MG/3ML) 0.083% IN NEBU
5.0000 mg | INHALATION_SOLUTION | Freq: Once | RESPIRATORY_TRACT | Status: AC
  Administered 2017-07-12: 5 mg via RESPIRATORY_TRACT
  Filled 2017-07-12: qty 6

## 2017-07-12 NOTE — ED Triage Notes (Signed)
Pt arrives to ED via POV [facility]. Pt also reports nonproductive cough x1 day. Pt reports h/x of asthma, used inhaler at home without relief. Pt with audible inspiratory and expiratory wheezes. Pt denies CP, no abdominal pain; no c/o N/V/D or fever.

## 2017-07-13 ENCOUNTER — Emergency Department
Admission: EM | Admit: 2017-07-13 | Discharge: 2017-07-13 | Disposition: A | Discharge status: Home / Self Care | Payer: Self-pay | Attending: Student in an Organized Health Care Education/Training Program | Admitting: Student in an Organized Health Care Education/Training Program

## 2017-07-13 DIAGNOSIS — J4521 Mild intermittent asthma with (acute) exacerbation: Secondary | ICD-10-CM

## 2017-07-13 MED ORDER — PREDNISONE 20 MG PO TABS
60.0000 mg | ORAL_TABLET | Freq: Once | ORAL | Status: AC
  Administered 2017-07-13: 60 mg via ORAL
  Filled 2017-07-13: qty 3

## 2017-07-13 MED ORDER — IPRATROPIUM-ALBUTEROL 0.5-2.5 (3) MG/3ML IN SOLN
3.0000 mL | Freq: Once | RESPIRATORY_TRACT | Status: AC
  Administered 2017-07-13: 3 mL via RESPIRATORY_TRACT
  Filled 2017-07-13: qty 3

## 2017-07-13 MED ORDER — AEROCHAMBER MV MISC: 0 refills | Status: DC

## 2017-07-13 MED ORDER — ALBUTEROL SULFATE HFA 108 (90 BASE) MCG/ACT IN AERS: 2.0000 | INHALATION_SPRAY | Freq: Four times a day (QID) | RESPIRATORY_TRACT | 2 refills | Status: DC | PRN

## 2017-07-13 NOTE — ED Provider Notes (Signed)
[facility] Emergency Department Provider Note    First MD Initiated Contact with Patient 07/13/17 0111     (approximate)  I have reviewed the triage vital signs and the nursing notes.   HISTORY  Chief Complaint Shortness of Breath    HPI Kenn FileSteven D Fehr is a 37 y.o. male with a history of asthma presents the ER with shortness of breath that started this morning.  States he felt wheezing but a rattle in his chest.  No fevers.  No productive cough.  States her past day or so he has had a dry or cough.  He does still smoke.  No lower extremity swelling.  No chest pain.  No chills.  No nausea or vomiting.  Symptoms not worsened by laying flat.  When arrived in the ER he did have diffuse wheezing expiratory and expiratory and was given a nebulizer treatment with significant improvement in symptoms.    Past Medical History:  Diagnosis Date  . Asthma   . Hypertension    Family History  Problem Relation Age of Onset  . Hypertension Father    History reviewed. No pertinent surgical history. Patient Active Problem List   Diagnosis Date Noted  . Asthma 07/07/2016  . Asthma exacerbation 02/20/2016      Prior to Admission medications   Medication Sig Start Date End Date Taking? Authorizing Provider  albuterol (PROVENTIL HFA;VENTOLIN HFA) 108 (90 Base) MCG/ACT inhaler Inhale 2 puffs into the lungs every 6 (six) hours as needed for wheezing or shortness of breath. 07/13/17   Willy Eddyobinson, Madelon Welsch, MD  benzonatate (TESSALON PERLES) 100 MG capsule Take 1 capsule (100 mg total) every 6 (six) hours as needed by mouth for cough. Patient not taking: Reported on 02/04/2017 11/15/16   Rockne MenghiniNorman, Anne-Caroline, MD  famotidine (PEPCID) 20 MG tablet Take 1 tablet (20 mg total) by mouth 2 (two) times daily. 05/12/17   Sharman CheekStafford, Phillip, MD  Spacer/Aero-Holding Chambers (AEROCHAMBER MV) inhaler Use as instructed 07/13/17   Willy Eddyobinson, Elysia Grand, MD    Allergies Patient has no known  allergies.    Social History Social History   Tobacco Use  . Smoking status: Current Every Day Smoker    Packs/day: 0.25    Years: 5.00    Pack years: 1.25    Types: Cigarettes  . Smokeless tobacco: Never Used  Substance Use Topics  . Alcohol use: Yes    Comment: occasionally  . Drug use: No    Review of Systems Patient denies headaches, rhinorrhea, blurry vision, numbness, shortness of breath, chest pain, edema, cough, abdominal pain, nausea, vomiting, diarrhea, dysuria, fevers, rashes or hallucinations unless otherwise stated above in HPI. ____________________________________________   PHYSICAL EXAM:  VITAL SIGNS: Vitals:   07/12/17 2146  BP: (!) 151/101  Pulse: 82  Resp: 18  Temp: 98.3 F (36.8 C)  SpO2: 97%    Constitutional: Alert and oriented.  Eyes: Conjunctivae are normal.  Head: Atraumatic. Nose: No congestion/rhinnorhea. Mouth/Throat: Mucous membranes are moist.   Neck: No stridor. Painless ROM.  Cardiovascular: Normal rate, regular rhythm. Grossly normal heart sounds.  Good peripheral circulation. Respiratory: Normal respiratory effort.  No retractions. Lungs with improved aeration with faint end expiratory wheeze. Gastrointestinal: Soft and nontender. No distention. No abdominal bruits. No CVA tenderness. Genitourinary:  Musculoskeletal: No lower extremity tenderness nor edema.  No joint effusions. Neurologic:  Normal speech and language. No gross focal neurologic deficits are appreciated. No facial droop Skin:  Skin is warm, dry and intact. No rash  noted. Psychiatric: Mood and affect are normal. Speech and behavior are normal.  ____________________________________________   LABS (all labs ordered are listed, but only abnormal results are displayed)  No results found for this or any previous visit (from the past 24  hour(s)). ____________________________________________ ____________________________________________  RADIOLOGY   ____________________________________________   PROCEDURES  Procedure(s) performed:  Procedures    Critical Care performed: no ____________________________________________   INITIAL IMPRESSION / ASSESSMENT AND PLAN / ED COURSE  Pertinent labs & imaging results that were available during my care of the patient were reviewed by me and considered in my medical decision making (see chart for details).   DDX: asthma, copd, pna, chf, ptx  THOMES BURAK is a 37 y.o. who presents to the ED with symptoms as described above.  Patient afebrile hemodynamically stable.  Exam and presentation most clinically consistent with acute asthma exacerbation.  Symptoms significantly improved with nebulizer treatment.  Will give steroids as well as additional nebulizer treatment and a refill of his albuterol.  Patient stable and appropriate for outpatient follow-up.      As part of my medical decision making, I reviewed the following data within the electronic MEDICAL RECORD NUMBER Nursing notes reviewed and incorporated, Labs reviewed, notes from prior ED visits.   ____________________________________________   FINAL CLINICAL IMPRESSION(S) / ED DIAGNOSES  Final diagnoses:  Mild intermittent asthma with exacerbation      NEW MEDICATIONS STARTED DURING THIS VISIT:  New Prescriptions   SPACER/AERO-HOLDING CHAMBERS (AEROCHAMBER MV) INHALER    Use as instructed     Note:  This document was prepared using Dragon voice recognition software and may include unintentional dictation errors.    Willy Eddy, MD 07/13/17 [phone]

## 2017-07-13 NOTE — ED Notes (Signed)
Pt reports improvement with breathing. Pt denies pain or SOB. Pt is able to walk and sats only drop to 94%. Pt took deep breaths and stats returned to 97%. MD aware.

## 2017-07-19 ENCOUNTER — Other Ambulatory Visit: Payer: Self-pay

## 2017-07-19 ENCOUNTER — Emergency Department
Admission: EM | Admit: 2017-07-19 | Discharge: 2017-07-19 | Disposition: A | Discharge status: Home / Self Care | Payer: Self-pay | Attending: Emergency Medicine | Admitting: Emergency Medicine

## 2017-07-19 DIAGNOSIS — Z79899 Other long term (current) drug therapy: Secondary | ICD-10-CM | POA: Insufficient documentation

## 2017-07-19 DIAGNOSIS — J45909 Unspecified asthma, uncomplicated: Secondary | ICD-10-CM | POA: Insufficient documentation

## 2017-07-19 DIAGNOSIS — F17210 Nicotine dependence, cigarettes, uncomplicated: Secondary | ICD-10-CM | POA: Insufficient documentation

## 2017-07-19 DIAGNOSIS — K920 Hematemesis: Secondary | ICD-10-CM

## 2017-07-19 DIAGNOSIS — I10 Essential (primary) hypertension: Secondary | ICD-10-CM | POA: Insufficient documentation

## 2017-07-19 DIAGNOSIS — K625 Hemorrhage of anus and rectum: Secondary | ICD-10-CM

## 2017-07-19 LAB — COMPREHENSIVE METABOLIC PANEL
ALK PHOS: 75 U/L (ref 38–126)
ALT: 23 U/L (ref 0–44)
AST: 25 U/L (ref 15–41)
Albumin: 4.5 g/dL (ref 3.5–5.0)
Anion gap: 9 (ref 5–15)
BUN: 17 mg/dL (ref 6–20)
CALCIUM: 9.2 mg/dL (ref 8.9–10.3)
CO2: 25 mmol/L (ref 22–32)
CREATININE: 0.99 mg/dL (ref 0.61–1.24)
Chloride: 105 mmol/L (ref 98–111)
GFR calc non Af Amer: >60 mL/min (ref >60)
GLUCOSE: 99 mg/dL (ref 70–99)
Potassium: 4.1 mmol/L (ref 3.5–5.1)
SODIUM: 139 mmol/L (ref 135–145)
Total Bilirubin: 0.6 mg/dL (ref 0.3–1.2)
Total Protein: 7.9 g/dL (ref 6.5–8.1)

## 2017-07-19 LAB — CBC
HCT: 40.8 % (ref 40.0–52.0)
Hemoglobin: 13.5 g/dL (ref 13.0–18.0)
MCH: 27.9 pg (ref 26.0–34.0)
MCHC: 33 g/dL (ref 32.0–36.0)
MCV: 84.4 fL (ref 80.0–100.0)
PLATELETS: 378 10*3/uL (ref 150–440)
RBC: 4.83 MIL/uL (ref 4.40–5.90)
RDW: 14.7 % — ABNORMAL HIGH (ref 11.5–14.5)
WBC: 6.2 10*3/uL (ref 3.8–10.6)

## 2017-07-19 LAB — TYPE AND SCREEN
ABO/RH(D): O POS
Antibody Screen: NEGATIVE

## 2017-07-19 MED ORDER — OMEPRAZOLE 40 MG PO CPDR: 40.0000 mg | DELAYED_RELEASE_CAPSULE | Freq: Every day | ORAL | 0 refills | Status: DC

## 2017-07-19 NOTE — ED Notes (Addendum)
Pt to the er for an episode of vomiting last night that had blood present. Pt says it was dark in color. No clots, only specks of blood.  Pt denies vomiting today. Pt had an bowel movement this morning that had blood in it. Pt reports dark in color. Pt is in no distress. Pt reports normal eating and drinking habits for the last week but changed last night. Pt does admit that he eats lots of hot peppers and spicy foods.

## 2017-07-19 NOTE — ED Provider Notes (Signed)
[facility] Emergency Department Provider Note ____________________________________________   First MD Initiated Contact with Patient 07/19/17 1831     (approximate)  I have reviewed the triage vital signs and the nursing notes.    HISTORY  Chief Complaint Hematemesis and Blood In Stools   HPI Justin Reid is a 37 y.o. male with a history of asthma and hypertension was presenting to the emergency department today complaining of vomiting blood yesterday and then blood in his stool this morning.  He says that he vomited twice yesterday and streaks of dark blood.  Then noticed dark blood mixed in with the stool this morning.  He has not had any blood in his stool since.  But has also not had any more bowel movements.  Denies abdominal pain.  Says that he was able to eat breakfast this morning without issue.  Denies any history of gastric or intestinal ulcers.  Says that he drinks occasionally.  Says he does smoke cigarettes.  Says that he also intermittently gets heartburn.  Patient does not take any home medications including blood thinners.  Past Medical History:  Diagnosis Date  . Asthma   . Hypertension     Patient Active Problem List   Diagnosis Date Noted  . Asthma 07/07/2016  . Asthma exacerbation 02/20/2016    History reviewed. No pertinent surgical history.  Prior to Admission medications   Medication Sig Start Date End Date Taking? Authorizing Provider  albuterol (PROVENTIL HFA;VENTOLIN HFA) 108 (90 Base) MCG/ACT inhaler Inhale 2 puffs into the lungs every 6 (six) hours as needed for wheezing or shortness of breath. 07/13/17   Willy Eddy, MD  benzonatate (TESSALON PERLES) 100 MG capsule Take 1 capsule (100 mg total) every 6 (six) hours as needed by mouth for cough. Patient not taking: Reported on 02/04/2017 11/15/16   Rockne Menghini, MD  famotidine (PEPCID) 20 MG tablet Take 1 tablet (20 mg total) by mouth 2 (two) times daily. 05/12/17    Sharman Cheek, MD  Spacer/Aero-Holding Chambers (AEROCHAMBER MV) inhaler Use as instructed 07/13/17   Willy Eddy, MD    Allergies Patient has no known allergies.  Family History  Problem Relation Age of Onset  . Hypertension Father     Social History Social History   Tobacco Use  . Smoking status: Current Every Day Smoker    Packs/day: 0.25    Years: 5.00    Pack years: 1.25    Types: Cigarettes  . Smokeless tobacco: Never Used  Substance Use Topics  . Alcohol use: Yes    Comment: occasionally  . Drug use: No    Review of Systems  Constitutional: No fever/chills Eyes: No visual changes. ENT: No sore throat. Cardiovascular: Denies chest pain. Respiratory: Denies shortness of breath. Gastrointestinal: No abdominal pain.  No nausea, no vomiting.  No diarrhea.  No constipation. Genitourinary: Negative for dysuria. Musculoskeletal: Negative for back pain. Skin: Negative for rash. Neurological: Negative for headaches, focal weakness or numbness.   ____________________________________________   PHYSICAL EXAM:  VITAL SIGNS: ED Triage Vitals  Enc Vitals Group     BP 07/19/17 1803 (!) 139/99     Pulse Rate 07/19/17 1803 85     Resp 07/19/17 1803 16     Temp 07/19/17 1803 98.6 F (37 C)     Temp Source 07/19/17 1803 Oral     SpO2 07/19/17 1803 98 %     Weight 07/19/17 1802 275 lb (124.7 kg)     Height 07/19/17  1802 5\' 9"  (1.753 m)     Head Circumference --      Peak Flow --      Pain Score 07/19/17 1802 5     Pain Loc --      Pain Edu? --      Excl. in GC? --     Constitutional: Alert and oriented. Well appearing and in no acute distress. Eyes: Conjunctivae are normal.  Head: Atraumatic. Nose: No congestion/rhinnorhea. Mouth/Throat: Mucous membranes are moist.  Neck: No stridor.   Cardiovascular: Normal rate, regular rhythm. Grossly normal heart sounds.  Respiratory: Normal respiratory effort.  No retractions. Lungs CTAB. Gastrointestinal:  Soft and nontender. No distention.   Rectal exam with small non-engorged hemorrhoid at 12:00.  No blood externally.  Digital rectal exam with a very small amount of brown stool on the glove.  Grossly heme-negative and Hemoccult negative.  Musculoskeletal: No lower extremity tenderness nor edema.  No joint effusions. Neurologic:  Normal speech and language. No gross focal neurologic deficits are appreciated. Skin:  Skin is warm, dry and intact. No rash noted. Psychiatric: Mood and affect are normal. Speech and behavior are normal.  ____________________________________________   LABS (all labs ordered are listed, but only abnormal results are displayed)  Labs Reviewed  CBC - Abnormal; Notable for the following components:      Result Value   RDW 14.7 (*)    All other components within normal limits  COMPREHENSIVE METABOLIC PANEL  POC OCCULT BLOOD, ED  TYPE AND SCREEN   ____________________________________________  EKG   ____________________________________________  RADIOLOGY   ____________________________________________   PROCEDURES  Procedure(s) performed:   Procedures  Critical Care performed:   ____________________________________________   INITIAL IMPRESSION / ASSESSMENT AND PLAN / ED COURSE  Pertinent labs & imaging results that were available during my care of the patient were reviewed by me and considered in my medical decision making (see chart for details).  DDX: Gastric ulcer, bleeding gastric ulcer, hematemesis, diverticulosis, hemorrhoid bleeding As part of my medical decision making, I reviewed the following data within the electronic MEDICAL RECORD NUMBER Notes from prior ED visits  ----------------------------------------- 7:43 PM on 07/19/2017 -----------------------------------------  Patient with very reassuring lab results.  No vomiting today and heme-negative.  No further episodes of bleeding since this morning.  We will discharge the patient  on omeprazole.  I discussed with him the diagnosis as well as the need for him to follow-up with gastroneurology.  I believe the patient will need further work-up including likely endoscopy and colonoscopy.  He is understanding of the diagnosis as well as treatment plan and willing to comply.  Will return for any worsening or concerning symptoms especially further bleeding. ____________________________________________   FINAL CLINICAL IMPRESSION(S) / ED DIAGNOSES  Hematemesis.  Blood per rectum.  NEW MEDICATIONS STARTED DURING THIS VISIT:  New Prescriptions   No medications on file     Note:  This document was prepared using Dragon voice recognition software and may include unintentional dictation errors.     Myrna BlazerSchaevitz, Priyah Schmuck Matthew, MD 07/19/17 [phone]

## 2017-07-19 NOTE — ED Triage Notes (Addendum)
Pt stated that yesterday he vomited x2 and both times he noted dark red blood in the vomit - Pt then reports that today when he had a bowel movement he noted dark red blood in stool - pt reports abd pain

## 2017-08-19 ENCOUNTER — Other Ambulatory Visit: Payer: Self-pay

## 2017-08-19 ENCOUNTER — Emergency Department
Admission: EM | Admit: 2017-08-19 | Discharge: 2017-08-19 | Disposition: A | Discharge status: Home / Self Care | Payer: Self-pay | Attending: Emergency Medicine | Admitting: Emergency Medicine

## 2017-08-19 ENCOUNTER — Encounter: Payer: Self-pay | Admitting: Emergency Medicine

## 2017-08-19 DIAGNOSIS — M5441 Lumbago with sciatica, right side: Secondary | ICD-10-CM | POA: Insufficient documentation

## 2017-08-19 DIAGNOSIS — M5442 Lumbago with sciatica, left side: Secondary | ICD-10-CM | POA: Insufficient documentation

## 2017-08-19 DIAGNOSIS — Z79899 Other long term (current) drug therapy: Secondary | ICD-10-CM | POA: Insufficient documentation

## 2017-08-19 DIAGNOSIS — F17210 Nicotine dependence, cigarettes, uncomplicated: Secondary | ICD-10-CM | POA: Insufficient documentation

## 2017-08-19 DIAGNOSIS — I10 Essential (primary) hypertension: Secondary | ICD-10-CM | POA: Insufficient documentation

## 2017-08-19 DIAGNOSIS — J45909 Unspecified asthma, uncomplicated: Secondary | ICD-10-CM | POA: Insufficient documentation

## 2017-08-19 MED ORDER — METHOCARBAMOL 500 MG PO TABS: 500.0000 mg | ORAL_TABLET | Freq: Two times a day (BID) | ORAL | 0 refills | Status: AC | PRN

## 2017-08-19 MED ORDER — PREDNISONE 50 MG PO TABS: ORAL_TABLET | ORAL | 0 refills | Status: DC

## 2017-08-19 NOTE — ED Provider Notes (Signed)
[facility] Emergency Department Provider Note  ____________________________________________  Time seen: Approximately 4:17 PM  I have reviewed the triage vital signs and the nursing notes.   HISTORY  Chief Complaint Back Pain    HPI Justin Reid is a 37 y.o. male presents to the emergency department with bilateral low back pain with bilateral lower extremity radiculopathy for the past month that worsens when patient stands for prolonged periods of time.  Patient denies weakness, bowel or bladder incontinence or saddle anesthesia.  Patient denies falls or mechanisms of trauma.  No prior back surgeries.  He currently rates his pain at 7 out of 10 in intensity.  He denies dysuria, hematuria and increased urinary frequency.  No prior history of nephrolithiasis.   Past Medical History:  Diagnosis Date  . Asthma   . Hypertension     Patient Active Problem List   Diagnosis Date Noted  . Asthma 07/07/2016  . Asthma exacerbation 02/20/2016    History reviewed. No pertinent surgical history.  Prior to Admission medications   Medication Sig Start Date End Date Taking? Authorizing Provider  albuterol (PROVENTIL HFA;VENTOLIN HFA) 108 (90 Base) MCG/ACT inhaler Inhale 2 puffs into the lungs every 6 (six) hours as needed for wheezing or shortness of breath. 07/13/17   Willy Eddyobinson, Patrick, MD  benzonatate (TESSALON PERLES) 100 MG capsule Take 1 capsule (100 mg total) every 6 (six) hours as needed by mouth for cough. Patient not taking: Reported on 02/04/2017 11/15/16   Rockne MenghiniNorman, Anne-Caroline, MD  famotidine (PEPCID) 20 MG tablet Take 1 tablet (20 mg total) by mouth 2 (two) times daily. 05/12/17   Sharman CheekStafford, Phillip, MD  methocarbamol (ROBAXIN) 500 MG tablet Take 1 tablet (500 mg total) by mouth 2 (two) times daily as needed for up to 5 days. 08/19/17 08/24/17  Orvil FeilWoods, Kamarius Buckbee M, PA-C  omeprazole (PRILOSEC) 40 MG capsule Take 1 capsule (40 mg total) by mouth daily. 07/19/17 07/19/18   Myrna BlazerSchaevitz, David Matthew, MD  predniSONE (DELTASONE) 50 MG tablet Take one 50 mg tablet once daily for the next five days. 08/19/17   Orvil FeilWoods, [place] Yearby M, PA-C  Spacer/Aero-Holding Chambers (AEROCHAMBER MV) inhaler Use as instructed 07/13/17   Willy Eddyobinson, Patrick, MD    Allergies Patient has no known allergies.  Family History  Problem Relation Age of Onset  . Hypertension Father     Social History Social History   Tobacco Use  . Smoking status: Current Every Day Smoker    Packs/day: 0.25    Years: 5.00    Pack years: 1.25    Types: Cigarettes  . Smokeless tobacco: Never Used  Substance Use Topics  . Alcohol use: Yes    Comment: occasionally  . Drug use: No     Review of Systems  Constitutional: No fever/chills Eyes: No visual changes. No discharge ENT: No upper respiratory complaints. Cardiovascular: no chest pain. Respiratory: no cough. No SOB. Gastrointestinal: No abdominal pain.  No nausea, no vomiting.  No diarrhea.  No constipation. Musculoskeletal: Patient has low back pain.  Skin: Negative for rash, abrasions, lacerations, ecchymosis. Neurological. Patient has bilateral lower extremity radiculopathy.   ____________________________________________   PHYSICAL EXAM:  VITAL SIGNS: ED Triage Vitals  Enc Vitals Group     BP 08/19/17 1526 (!) 142/93     Pulse Rate 08/19/17 1526 73     Resp 08/19/17 1526 18     Temp 08/19/17 1526 98.3 F (36.8 C)     Temp Source 08/19/17 1526 Oral  SpO2 08/19/17 1526 97 %     Weight 08/19/17 1536 274 lb 14.6 oz (124.7 kg)     Height 08/19/17 1536 5\' 10"  (1.778 m)     Head Circumference --      Peak Flow --      Pain Score 08/19/17 1536 8     Pain Loc --      Pain Edu? --      Excl. in GC? --      Constitutional: Alert and oriented. Well appearing and in no acute distress. Eyes: Conjunctivae are normal. PERRL. EOMI. Head: Atraumatic. Cardiovascular: Normal rate, regular rhythm. Normal S1 and S2.  Good peripheral  circulation. Respiratory: Normal respiratory effort without tachypnea or retractions. Lungs CTAB. Good air entry to the bases with no decreased or absent breath sounds. Musculoskeletal: Patient has paraspinal muscle tenderness along the lumbar spine and positive straight leg raise bilaterally. Neurologic:  Normal speech and language. No gross focal neurologic deficits are appreciated.  Skin:  Skin is warm, dry and intact. No rash noted.  ____________________________________________   LABS (all labs ordered are listed, but only abnormal results are displayed)  Labs Reviewed - No data to display ____________________________________________  EKG   ____________________________________________  RADIOLOGY   No results found.  ____________________________________________    PROCEDURES  Procedure(s) performed:    Procedures    Medications - No data to display   ____________________________________________   INITIAL IMPRESSION / ASSESSMENT AND PLAN / ED COURSE  Pertinent labs & imaging results that were available during my care of the patient were reviewed by me and considered in my medical decision making (see chart for details).  Review of the [place] CSRS was performed in accordance of the [facility] prior to dispensing any controlled drugs.      Assessment and plan Low back pain with bilateral lower extremity radiculopathy Patient presents to the emergency department with low back pain that radiates into the posterior thighs.  Patient reports that symptoms have occurred for approximately 1 month.  Patient was treated empirically with prednisone after he conveyed that he has been trying anti-inflammatories at home.  Patient was also discharged with Robaxin.  He was advised to follow-up with primary care as needed.  All patient questions were answered.  ____________________________________________  FINAL CLINICAL IMPRESSION(S) / ED DIAGNOSES  Final diagnoses:  Acute  bilateral low back pain with bilateral sciatica      NEW MEDICATIONS STARTED DURING THIS VISIT:  ED Discharge Orders         Ordered    predniSONE (DELTASONE) 50 MG tablet     08/19/17 1606    methocarbamol (ROBAXIN) 500 MG tablet  2 times daily PRN     08/19/17 1606              This chart was dictated using voice recognition software/Dragon. Despite best efforts to proofread, errors can occur which can change the meaning. Any change was purely unintentional.    Orvil FeilWoods, Salvador Bigbee M, PA-C 08/19/17 1633    Nita SickleVeronese, [place], MD 08/19/17 [phone]

## 2017-08-19 NOTE — ED Notes (Signed)
See triage note  States he developed lower back pain about 1 month ago   States pain has gotten worse over the past week    States pain radiates into both buttock areas   Ambulates well to treatment room

## 2017-08-19 NOTE — ED Triage Notes (Signed)
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 Pt states pain radiates into bil post thighs. Denies urinary symptoms. Has tried multiple OTC meds without relief.

## 2017-09-14 ENCOUNTER — Emergency Department
Admission: EM | Admit: 2017-09-14 | Discharge: 2017-09-14 | Disposition: A | Discharge status: Home / Self Care | Payer: Self-pay | Attending: Emergency Medicine | Admitting: Emergency Medicine

## 2017-09-14 ENCOUNTER — Encounter: Payer: Self-pay | Admitting: Emergency Medicine

## 2017-09-14 DIAGNOSIS — F17210 Nicotine dependence, cigarettes, uncomplicated: Secondary | ICD-10-CM | POA: Insufficient documentation

## 2017-09-14 DIAGNOSIS — X102XXA Contact with fats and cooking oils, initial encounter: Secondary | ICD-10-CM | POA: Insufficient documentation

## 2017-09-14 DIAGNOSIS — T25221A Burn of second degree of right foot, initial encounter: Secondary | ICD-10-CM | POA: Insufficient documentation

## 2017-09-14 DIAGNOSIS — J45909 Unspecified asthma, uncomplicated: Secondary | ICD-10-CM | POA: Insufficient documentation

## 2017-09-14 DIAGNOSIS — Y929 Unspecified place or not applicable: Secondary | ICD-10-CM | POA: Insufficient documentation

## 2017-09-14 DIAGNOSIS — Y998 Other external cause status: Secondary | ICD-10-CM | POA: Insufficient documentation

## 2017-09-14 DIAGNOSIS — Y93G3 Activity, cooking and baking: Secondary | ICD-10-CM | POA: Insufficient documentation

## 2017-09-14 DIAGNOSIS — I10 Essential (primary) hypertension: Secondary | ICD-10-CM | POA: Insufficient documentation

## 2017-09-14 DIAGNOSIS — Z79899 Other long term (current) drug therapy: Secondary | ICD-10-CM | POA: Insufficient documentation

## 2017-09-14 MED ORDER — HYDROCODONE-ACETAMINOPHEN 5-325 MG PO TABS: 1.0000 | ORAL_TABLET | Freq: Four times a day (QID) | ORAL | 0 refills | Status: DC | PRN

## 2017-09-14 MED ORDER — IBUPROFEN 600 MG PO TABS: 600.0000 mg | ORAL_TABLET | Freq: Three times a day (TID) | ORAL | 0 refills | Status: DC | PRN

## 2017-09-14 MED ORDER — SILVER SULFADIAZINE 1 % EX CREA
TOPICAL_CREAM | CUTANEOUS | Status: AC
  Filled 2017-09-14: qty 85

## 2017-09-14 MED ORDER — HYDROCODONE-ACETAMINOPHEN 5-325 MG PO TABS
2.0000 | ORAL_TABLET | Freq: Once | ORAL | Status: AC
  Administered 2017-09-14: 2 via ORAL
  Filled 2017-09-14: qty 2

## 2017-09-14 MED ORDER — SILVER SULFADIAZINE 1 % EX CREA
TOPICAL_CREAM | Freq: Once | CUTANEOUS | Status: AC
  Administered 2017-09-14: 18:00:00 via TOPICAL

## 2017-09-14 NOTE — ED Provider Notes (Signed)
[facility] Emergency Department Provider Note   ____________________________________________   First MD Initiated Contact with Patient 09/14/17 1739     (approximate)  I have reviewed the triage vital signs and the nursing notes.   HISTORY  Chief Complaint Burn   HPI Justin Reid is a 37 y.o. male presents to the emergency department with complaint of right foot pain after some hot grease dropped into his right foot while he was cooking.  Patient states that his last tetanus was 1 year ago.  Currently his foot has a small red area with a open blister to it.  Rates his pain as a 10/10.  Past Medical History:  Diagnosis Date  . Asthma   . Hypertension     Patient Active Problem List   Diagnosis Date Noted  . Asthma 07/07/2016  . Asthma exacerbation 02/20/2016    History reviewed. No pertinent surgical history.  Prior to Admission medications   Medication Sig Start Date End Date Taking? Authorizing Provider  albuterol (PROVENTIL HFA;VENTOLIN HFA) 108 (90 Base) MCG/ACT inhaler Inhale 2 puffs into the lungs every 6 (six) hours as needed for wheezing or shortness of breath. 07/13/17   Willy Eddy, MD  famotidine (PEPCID) 20 MG tablet Take 1 tablet (20 mg total) by mouth 2 (two) times daily. 05/12/17   Sharman Cheek, MD  HYDROcodone-acetaminophen (NORCO/VICODIN) 5-325 MG tablet Take 1 tablet by mouth every 6 (six) hours as needed for moderate pain. 09/14/17   Tommi Rumps, PA-C  ibuprofen (ADVIL,MOTRIN) 600 MG tablet Take 1 tablet (600 mg total) by mouth every 8 (eight) hours as needed. 09/14/17   Tommi Rumps, PA-C  omeprazole (PRILOSEC) 40 MG capsule Take 1 capsule (40 mg total) by mouth daily. 07/19/17 07/19/18  Schaevitz, Myra Rude, MD  Spacer/Aero-Holding Chambers (AEROCHAMBER MV) inhaler Use as instructed 07/13/17   Willy Eddy, MD    Allergies Patient has no known allergies.  Family History  Problem Relation Age of  Onset  . Hypertension Father     Social History Social History   Tobacco Use  . Smoking status: Current Every Day Smoker    Packs/day: 0.25    Years: 5.00    Pack years: 1.25    Types: Cigarettes  . Smokeless tobacco: Never Used  Substance Use Topics  . Alcohol use: Yes    Comment: occasionally  . Drug use: No    Review of Systems Constitutional: No fever/chills Cardiovascular: Denies chest pain. Respiratory: Denies shortness of breath. Musculoskeletal: Positive for foot pain. Skin: Positive for burn to the right foot. Neurological: Negative for headaches, focal weakness or numbness. ____________________________________________   PHYSICAL EXAM:  VITAL SIGNS: ED Triage Vitals  Enc Vitals Group     BP 09/14/17 1714 (!) 134/91     Pulse Rate 09/14/17 1714 84     Resp 09/14/17 1714 16     Temp 09/14/17 1714 98.6 F (37 C)     Temp Source 09/14/17 1714 Oral     SpO2 09/14/17 1714 98 %     Weight 09/14/17 1715 274 lb 14.6 oz (124.7 kg)     Height 09/14/17 1715 5\' 10"  (1.778 m)     Head Circumference --      Peak Flow --      Pain Score 09/14/17 1715 10     Pain Loc --      Pain Edu? --      Excl. in GC? --    Constitutional: Alert  and oriented. Well appearing and in no acute distress. Eyes: Conjunctivae are normal.  Head: Atraumatic. Neck: No stridor.   Cardiovascular: Normal rate, regular rhythm. Grossly normal heart sounds.  Good peripheral circulation. Respiratory: Normal respiratory effort.  No retractions. Lungs CTAB. Musculoskeletal: Examination of the right foot dorsal aspect there is a small 2 cm rounded open blister without any active drainage.  Areas consistent with a grease burn.  Patient has applied butter to the area. Neurologic:  Normal speech and language. No gross focal neurologic deficits are appreciated.  Skin:  Skin is warm, dry.  Second-degree burn as noted above. Psychiatric: Mood and affect are normal. Speech and behavior are  normal.  ____________________________________________   LABS (all labs ordered are listed, but only abnormal results are displayed)  Labs Reviewed - No data to display  PROCEDURES  Procedure(s) performed: None  Procedures  Critical Care performed: No  ____________________________________________   INITIAL IMPRESSION / ASSESSMENT AND PLAN / ED COURSE  As part of my medical decision making, I reviewed the following data within the electronic MEDICAL RECORD NUMBER Notes from prior ED visits and [place] Controlled Substance Database  37 year old male reports to the emergency department with a grease burn to his right foot.  He states that he was cooking and dropped grease on his foot.  He applied letter to it which has not helped his symptoms at all.  Patient was given Norco while in the department.  Area was cleaned with normal saline and a Silvadene dressing was applied.  Patient was given instructions on caring for the wound and watch for any signs of infection.  He will continue using Silvadene at home.  He was given a prescription for Norco 1 every 6 hours as needed and ibuprofen for inflammation.  He is to follow-up with his PCP if any continued problems or [facility] if any signs of infection.  ____________________________________________   FINAL CLINICAL IMPRESSION(S) / ED DIAGNOSES  Final diagnoses:  Partial thickness burn of right foot, initial encounter     ED Discharge Orders         Ordered    HYDROcodone-acetaminophen (NORCO/VICODIN) 5-325 MG tablet  Every 6 hours PRN     09/14/17 1817    ibuprofen (ADVIL,MOTRIN) 600 MG tablet  Every 8 hours PRN     09/14/17 1817           Note:  This document was prepared using Dragon voice recognition software and may include unintentional dictation errors.    Tommi Rumps, PA-C 09/14/17 1855    Arnaldo Natal, MD 09/14/17 1921

## 2017-09-14 NOTE — Discharge Instructions (Addendum)
Follow-up with [facility] acute care or your primary care provider if any continued problems.  Watch the area for any signs of infection such as redness or pus.  Can taking ibuprofen every 8 hours with food for inflammation of the burn area.  Norco if needed for severe pain.  Elevate foot if needed for swelling.  Keep area clean and dry.

## 2017-09-14 NOTE — ED Notes (Signed)
See triage note  States he was cooking and dropped hot grease on right foot  Red area noted to top of foot

## 2017-09-14 NOTE — ED Notes (Signed)
Burn to top of right foot cleaned with NS and silvadene applied

## 2017-09-14 NOTE — ED Triage Notes (Signed)
Pt present to the ED with a burn to the top of his right foot. He was cooking and grease splashing on it. There it is red with a blister on it.

## 2017-11-29 ENCOUNTER — Encounter: Payer: Self-pay | Admitting: Emergency Medicine

## 2017-11-29 ENCOUNTER — Emergency Department
Admission: EM | Admit: 2017-11-29 | Discharge: 2017-11-29 | Disposition: A | Discharge status: Home / Self Care | Payer: Self-pay | Attending: Emergency Medicine | Admitting: Emergency Medicine

## 2017-11-29 ENCOUNTER — Emergency Department: Payer: Self-pay

## 2017-11-29 ENCOUNTER — Other Ambulatory Visit: Payer: Self-pay

## 2017-11-29 DIAGNOSIS — J4520 Mild intermittent asthma, uncomplicated: Secondary | ICD-10-CM | POA: Insufficient documentation

## 2017-11-29 DIAGNOSIS — I10 Essential (primary) hypertension: Secondary | ICD-10-CM | POA: Insufficient documentation

## 2017-11-29 DIAGNOSIS — M25462 Effusion, left knee: Secondary | ICD-10-CM | POA: Insufficient documentation

## 2017-11-29 DIAGNOSIS — F17210 Nicotine dependence, cigarettes, uncomplicated: Secondary | ICD-10-CM | POA: Insufficient documentation

## 2017-11-29 DIAGNOSIS — Z79899 Other long term (current) drug therapy: Secondary | ICD-10-CM | POA: Insufficient documentation

## 2017-11-29 DIAGNOSIS — M25511 Pain in right shoulder: Secondary | ICD-10-CM | POA: Insufficient documentation

## 2017-11-29 MED ORDER — IPRATROPIUM-ALBUTEROL 0.5-2.5 (3) MG/3ML IN SOLN
3.0000 mL | Freq: Once | RESPIRATORY_TRACT | Status: AC
  Administered 2017-11-29: 3 mL via RESPIRATORY_TRACT
  Filled 2017-11-29: qty 3

## 2017-11-29 MED ORDER — ALBUTEROL SULFATE HFA 108 (90 BASE) MCG/ACT IN AERS: 2.0000 | INHALATION_SPRAY | Freq: Four times a day (QID) | RESPIRATORY_TRACT | 2 refills | Status: DC | PRN

## 2017-11-29 MED ORDER — MELOXICAM 15 MG PO TABS: 15.0000 mg | ORAL_TABLET | Freq: Every day | ORAL | 2 refills | Status: DC

## 2017-11-29 NOTE — ED Provider Notes (Signed)
[facility] Emergency Department Provider Note  ____________________________________________   First MD Initiated Contact with Patient 11/29/17 1218     (approximate)  I have reviewed the triage vital signs and the nursing notes.   HISTORY  Chief Complaint Cough; Knee Pain; and Shoulder Pain    HPI Justin Reid is a 37 y.o. male presents emergency department complaining of shortness of breath and wheezing for 2 days.  He states he has asthma but has run out of his inhaler.  He is also complaining of right shoulder and left knee pain.  States he cannot bend his knee completely and that the right shoulder hurts all the time.  He states it is worse at night when he sleeps.  He denies any numbness or tingling.  Denies any known injuries.    Past Medical History:  Diagnosis Date  . Asthma   . Hypertension     Patient Active Problem List   Diagnosis Date Noted  . Asthma 07/07/2016  . Asthma exacerbation 02/20/2016    History reviewed. No pertinent surgical history.  Prior to Admission medications   Medication Sig Start Date End Date Taking? Authorizing Provider  albuterol (PROVENTIL HFA;VENTOLIN HFA) 108 (90 Base) MCG/ACT inhaler Inhale 2 puffs into the lungs every 6 (six) hours as needed for wheezing or shortness of breath. 11/29/17   Brena Windsor, Roselyn BeringSusan W, PA-C  famotidine (PEPCID) 20 MG tablet Take 1 tablet (20 mg total) by mouth 2 (two) times daily. 05/12/17   Sharman CheekStafford, Phillip, MD  meloxicam (MOBIC) 15 MG tablet Take 1 tablet (15 mg total) by mouth daily. 11/29/17 11/29/18  Jazzy Parmer, Roselyn BeringSusan W, PA-C  omeprazole (PRILOSEC) 40 MG capsule Take 1 capsule (40 mg total) by mouth daily. 07/19/17 07/19/18  Schaevitz, Myra Rudeavid Matthew, MD  Spacer/Aero-Holding Chambers (AEROCHAMBER MV) inhaler Use as instructed 07/13/17   Willy Eddyobinson, Patrick, MD    Allergies Patient has no known allergies.  Family History  Problem Relation Age of Onset  . Hypertension Father      Social History Social History   Tobacco Use  . Smoking status: Current Every Day Smoker    Packs/day: 0.25    Years: 5.00    Pack years: 1.25    Types: Cigarettes  . Smokeless tobacco: Never Used  Substance Use Topics  . Alcohol use: Yes    Comment: occasionally  . Drug use: No    Review of Systems  Constitutional: No fever/chills Eyes: No visual changes. ENT: No sore throat. Respiratory: Positive for wheezing and cough Genitourinary: Negative for dysuria. Musculoskeletal: Negative for back pain.  Positive for right shoulder and left knee pain Skin: Negative for rash.    ____________________________________________   PHYSICAL EXAM:  VITAL SIGNS: ED Triage Vitals  Enc Vitals Group     BP 11/29/17 1153 (!) 141/92     Pulse Rate 11/29/17 1153 87     Resp 11/29/17 1153 16     Temp 11/29/17 1153 98.5 F (36.9 C)     Temp Source 11/29/17 1153 Oral     SpO2 11/29/17 1153 97 %     Weight 11/29/17 1154 274 lb 14.6 oz (124.7 kg)     Height 11/29/17 1154 5\' 10"  (1.778 m)     Head Circumference --      Peak Flow --      Pain Score 11/29/17 1154 10     Pain Loc --      Pain Edu? --      Excl. in  GC? --     Constitutional: Alert and oriented. Well appearing and in no acute distress. Eyes: Conjunctivae are normal.  Head: Atraumatic. Nose: No congestion/rhinnorhea. Mouth/Throat: Mucous membranes are moist.   Neck:  supple no lymphadenopathy noted Cardiovascular: Normal rate, regular rhythm. Heart sounds are normal Respiratory: Normal respiratory effort.  No retractions, lungs with deep creased breath sounds in the lower lung fields.  Patient has audible wheezing from the [place] GU: deferred Musculoskeletal: Decreased range of motion of the left knee secondary to discomfort.  Tender at the joint line only.  The right shoulder is tender at the joint line.  Decreased range of motion with overhead reach.  Neurovascular is intact bilaterally.   Neurologic:  Normal  speech and language.  Skin:  Skin is warm, dry and intact. No rash noted. Psychiatric: Mood and affect are normal. Speech and behavior are normal.  ____________________________________________   LABS (all labs ordered are listed, but only abnormal results are displayed)  Labs Reviewed - No data to display ____________________________________________   ____________________________________________  RADIOLOGY  Chest x-ray is negative for pneumonia, positive cardiomegaly Right shoulder x-ray shows osteo arthritis  Left knee x-ray she is a small knee effusion  ____________________________________________   PROCEDURES  Procedure(s) performed: DuoNeb, knee immobilizer  Procedures    ____________________________________________   INITIAL IMPRESSION / ASSESSMENT AND PLAN / ED COURSE  Pertinent labs & imaging results that were available during my care of the patient were reviewed by me and considered in my medical decision making (see chart for details).   Patient is 37 year old male presents emergency department complaining of wheezing, right shoulder pain, left knee pain.  Physical exam shows decreased breath sounds with mild wheezing.  Right shoulder is tender in left knee is tender.  Test x-rays negative, x-ray of the right shoulder shows osteoarthritis, left knee x-ray shows a small joint effusion.  Patient had increased air movement after the DuoNeb.  He was given the test results.  He was placed in a knee immobilizer.  Given a work note.  Was discharged in stable condition.     As part of my medical decision making, I reviewed the following data within the electronic MEDICAL RECORD NUMBER Nursing notes reviewed and incorporated, Old chart reviewed, Radiograph reviewed see above, Notes from prior ED visits and [place] Controlled Substance Database  ____________________________________________   FINAL CLINICAL IMPRESSION(S) / ED DIAGNOSES  Final diagnoses:  Mild intermittent  asthma without complication  Effusion of left knee  Acute pain of right shoulder      NEW MEDICATIONS STARTED DURING THIS VISIT:  Discharge Medication List as of 11/29/2017  1:20 PM    START taking these medications   Details  meloxicam (MOBIC) 15 MG tablet Take 1 tablet (15 mg total) by mouth daily., Starting Tue 11/29/2017, Until Wed 11/29/2018, Normal         Note:  This document was prepared using Dragon voice recognition software and may include unintentional dictation errors.    Faythe Ghee, PA-C 11/29/17 1516    Emily Filbert, MD 11/29/17 [phone]

## 2017-11-29 NOTE — ED Triage Notes (Signed)
Says has had cough for about a week. Uses inhaler at home.  Says also for about a week his right hsoulder hurts especilly with movement--like needles in it.   Left knee pain with swelling since yesterday.

## 2017-11-29 NOTE — ED Notes (Signed)
AAOx3.  Skin warm and dry.  NAD 

## 2017-11-29 NOTE — ED Notes (Signed)
See triage note  Presents with some SOB and wheezing for couple of days  No fever  Also having some pain to right shoulder  Denies any injury  Describes pain as "burning"  Denies any injury  States pain is worse at night  Good pulses   Also having some swelling to left knee  Ambulates well to treatment room

## 2017-11-29 NOTE — Discharge Instructions (Addendum)
Follow-up with Dr. Odis LusterBowers for your orthopedic problems.  Follow with regular doctor for the asthma.  If you are worsening please return the emergency department.  Take medications as prescribed.

## 2017-12-24 ENCOUNTER — Other Ambulatory Visit: Payer: Self-pay

## 2017-12-24 ENCOUNTER — Encounter: Payer: Self-pay | Admitting: Emergency Medicine

## 2017-12-24 ENCOUNTER — Emergency Department: Payer: Self-pay

## 2017-12-24 ENCOUNTER — Inpatient Hospital Stay
Admission: EM | Admit: 2017-12-24 | Discharge: 2017-12-26 | DRG: 872 | Disposition: A | Discharge status: Home / Self Care | Payer: Self-pay | Attending: Family Medicine | Admitting: Family Medicine

## 2017-12-24 DIAGNOSIS — J208 Acute bronchitis due to other specified organisms: Secondary | ICD-10-CM | POA: Diagnosis present

## 2017-12-24 DIAGNOSIS — Z8249 Family history of ischemic heart disease and other diseases of the circulatory system: Secondary | ICD-10-CM

## 2017-12-24 DIAGNOSIS — Z23 Encounter for immunization: Secondary | ICD-10-CM

## 2017-12-24 DIAGNOSIS — Z716 Tobacco abuse counseling: Secondary | ICD-10-CM

## 2017-12-24 DIAGNOSIS — Z79899 Other long term (current) drug therapy: Secondary | ICD-10-CM

## 2017-12-24 DIAGNOSIS — F17210 Nicotine dependence, cigarettes, uncomplicated: Secondary | ICD-10-CM | POA: Diagnosis present

## 2017-12-24 DIAGNOSIS — E871 Hypo-osmolality and hyponatremia: Secondary | ICD-10-CM | POA: Diagnosis present

## 2017-12-24 DIAGNOSIS — J101 Influenza due to other identified influenza virus with other respiratory manifestations: Secondary | ICD-10-CM | POA: Diagnosis present

## 2017-12-24 DIAGNOSIS — A419 Sepsis, unspecified organism: Secondary | ICD-10-CM | POA: Diagnosis present

## 2017-12-24 DIAGNOSIS — E876 Hypokalemia: Secondary | ICD-10-CM | POA: Diagnosis present

## 2017-12-24 DIAGNOSIS — A4189 Other specified sepsis: Principal | ICD-10-CM | POA: Diagnosis present

## 2017-12-24 DIAGNOSIS — J45901 Unspecified asthma with (acute) exacerbation: Secondary | ICD-10-CM | POA: Diagnosis present

## 2017-12-24 DIAGNOSIS — I10 Essential (primary) hypertension: Secondary | ICD-10-CM | POA: Diagnosis present

## 2017-12-24 LAB — BASIC METABOLIC PANEL
ANION GAP: 8 (ref 5–15)
BUN: 14 mg/dL (ref 6–20)
CALCIUM: 8.4 mg/dL — AB (ref 8.9–10.3)
CO2: 25 mmol/L (ref 22–32)
Chloride: 99 mmol/L (ref 98–111)
Creatinine, Ser: 1.05 mg/dL (ref 0.61–1.24)
Glucose, Bld: 97 mg/dL (ref 70–99)
POTASSIUM: 3 mmol/L — AB (ref 3.5–5.1)
Sodium: 132 mmol/L — ABNORMAL LOW (ref 135–145)

## 2017-12-24 LAB — CBC WITH DIFFERENTIAL/PLATELET
Abs Immature Granulocytes: 0.03 10*3/uL (ref 0.00–0.07)
BASOS PCT: 1 %
Basophils Absolute: 0 10*3/uL (ref 0.0–0.1)
EOS ABS: 0.3 10*3/uL (ref 0.0–0.5)
Eosinophils Relative: 5 %
HCT: 40 % (ref 39.0–52.0)
Hemoglobin: 12.7 g/dL — ABNORMAL LOW (ref 13.0–17.0)
IMMATURE GRANULOCYTES: 1 %
Lymphocytes Relative: 13 %
Lymphs Abs: 0.8 10*3/uL (ref 0.7–4.0)
MCH: 27 pg (ref 26.0–34.0)
MCHC: 31.8 g/dL (ref 30.0–36.0)
MCV: 85.1 fL (ref 80.0–100.0)
Monocytes Absolute: 0.7 10*3/uL (ref 0.1–1.0)
Monocytes Relative: 10 %
NEUTROS ABS: 4.5 10*3/uL (ref 1.7–7.7)
NEUTROS PCT: 70 %
NRBC: 0 % (ref 0.0–0.2)
PLATELETS: 342 10*3/uL (ref 150–400)
RBC: 4.7 MIL/uL (ref 4.22–5.81)
RDW: 13.3 % (ref 11.5–15.5)
WBC: 6.4 10*3/uL (ref 4.0–10.5)

## 2017-12-24 LAB — INFLUENZA PANEL BY PCR (TYPE A & B)
INFLAPCR: POSITIVE — AB
INFLBPCR: NEGATIVE

## 2017-12-24 LAB — MAGNESIUM: Magnesium: 2 mg/dL (ref 1.7–2.4)

## 2017-12-24 MED ORDER — KETOROLAC TROMETHAMINE 30 MG/ML IJ SOLN
30.0000 mg | Freq: Once | INTRAMUSCULAR | Status: AC
  Administered 2017-12-24: 30 mg via INTRAVENOUS
  Filled 2017-12-24: qty 1

## 2017-12-24 MED ORDER — POTASSIUM CHLORIDE 20 MEQ PO PACK
60.0000 meq | PACK | Freq: Once | ORAL | Status: AC
  Administered 2017-12-24: 60 meq via ORAL
  Filled 2017-12-24: qty 3

## 2017-12-24 MED ORDER — MELOXICAM 7.5 MG PO TABS
15.0000 mg | ORAL_TABLET | Freq: Every day | ORAL | Status: DC
  Administered 2017-12-25 – 2017-12-26 (×2): 15 mg via ORAL
  Filled 2017-12-24 (×2): qty 2

## 2017-12-24 MED ORDER — METHYLPREDNISOLONE SODIUM SUCC 125 MG IJ SOLR
125.0000 mg | Freq: Once | INTRAMUSCULAR | Status: AC
  Administered 2017-12-24: 125 mg via INTRAVENOUS
  Filled 2017-12-24: qty 2

## 2017-12-24 MED ORDER — ACETAMINOPHEN 650 MG RE SUPP: 650.0000 mg | Freq: Four times a day (QID) | RECTAL | Status: DC | PRN

## 2017-12-24 MED ORDER — OSELTAMIVIR PHOSPHATE 75 MG PO CAPS
75.0000 mg | ORAL_CAPSULE | Freq: Once | ORAL | Status: AC
  Administered 2017-12-24: 75 mg via ORAL
  Filled 2017-12-24: qty 1

## 2017-12-24 MED ORDER — ONDANSETRON HCL 4 MG PO TABS: 4.0000 mg | ORAL_TABLET | Freq: Four times a day (QID) | ORAL | Status: DC | PRN

## 2017-12-24 MED ORDER — ONDANSETRON HCL 4 MG/2ML IJ SOLN: 4.0000 mg | Freq: Four times a day (QID) | INTRAMUSCULAR | Status: DC | PRN

## 2017-12-24 MED ORDER — SODIUM CHLORIDE 0.9 % IV BOLUS (SEPSIS): 400.0000 mL | Freq: Once | INTRAVENOUS | Status: DC

## 2017-12-24 MED ORDER — IPRATROPIUM-ALBUTEROL 0.5-2.5 (3) MG/3ML IN SOLN
3.0000 mL | Freq: Four times a day (QID) | RESPIRATORY_TRACT | Status: DC
  Administered 2017-12-25 – 2017-12-26 (×6): 3 mL via RESPIRATORY_TRACT
  Filled 2017-12-24 (×6): qty 3

## 2017-12-24 MED ORDER — HYDROCODONE-ACETAMINOPHEN 5-325 MG PO TABS
1.0000 | ORAL_TABLET | ORAL | Status: DC | PRN
  Administered 2017-12-24 – 2017-12-26 (×3): 2 via ORAL
  Filled 2017-12-24 (×3): qty 2

## 2017-12-24 MED ORDER — BUDESONIDE 0.5 MG/2ML IN SUSP
0.5000 mg | Freq: Two times a day (BID) | RESPIRATORY_TRACT | Status: DC
  Administered 2017-12-24 – 2017-12-26 (×4): 0.5 mg via RESPIRATORY_TRACT
  Filled 2017-12-24 (×4): qty 2

## 2017-12-24 MED ORDER — PANTOPRAZOLE SODIUM 40 MG PO TBEC
40.0000 mg | DELAYED_RELEASE_TABLET | Freq: Every day | ORAL | Status: DC
  Administered 2017-12-25 – 2017-12-26 (×2): 40 mg via ORAL
  Filled 2017-12-24 (×2): qty 1

## 2017-12-24 MED ORDER — OSELTAMIVIR PHOSPHATE 75 MG PO CAPS
75.0000 mg | ORAL_CAPSULE | Freq: Two times a day (BID) | ORAL | Status: DC
  Administered 2017-12-24 – 2017-12-26 (×4): 75 mg via ORAL
  Filled 2017-12-24 (×5): qty 1

## 2017-12-24 MED ORDER — SODIUM CHLORIDE 0.9 % IV BOLUS (SEPSIS): 1000.0000 mL | Freq: Once | INTRAVENOUS | Status: DC

## 2017-12-24 MED ORDER — IPRATROPIUM-ALBUTEROL 0.5-2.5 (3) MG/3ML IN SOLN
3.0000 mL | Freq: Once | RESPIRATORY_TRACT | Status: AC
  Administered 2017-12-24: 3 mL via RESPIRATORY_TRACT
  Filled 2017-12-24: qty 3

## 2017-12-24 MED ORDER — ENOXAPARIN SODIUM 40 MG/0.4ML ~~LOC~~ SOLN
40.0000 mg | SUBCUTANEOUS | Status: DC
  Administered 2017-12-25: 40 mg via SUBCUTANEOUS
  Filled 2017-12-24 (×2): qty 0.4

## 2017-12-24 MED ORDER — SODIUM CHLORIDE 0.9 % IV BOLUS
1000.0000 mL | Freq: Once | INTRAVENOUS | Status: AC
  Administered 2017-12-24: 1000 mL via INTRAVENOUS

## 2017-12-24 MED ORDER — NICOTINE 14 MG/24HR TD PT24
14.0000 mg | MEDICATED_PATCH | Freq: Every day | TRANSDERMAL | Status: DC
  Administered 2017-12-25 – 2017-12-26 (×2): 14 mg via TRANSDERMAL
  Filled 2017-12-24 (×2): qty 1

## 2017-12-24 MED ORDER — ACETAMINOPHEN 325 MG PO TABS: 650.0000 mg | ORAL_TABLET | Freq: Four times a day (QID) | ORAL | Status: DC | PRN

## 2017-12-24 NOTE — ED Triage Notes (Signed)
Pt called ems for cough, chills, congestion that started this am. Fever 101.8 here. Used last of his inhaler at home

## 2017-12-24 NOTE — ED Triage Notes (Signed)
Pt called ems for

## 2017-12-24 NOTE — ED Notes (Signed)
Patient drank water without difficulty. Patient's wife inquired if patient could have some chips.

## 2017-12-24 NOTE — H&P (Signed)
[employer] -  at [facility]   PATIENT NAME: Justin Reid    MR#:  [id number]  DATE OF BIRTH:  1980-09-05  DATE OF ADMISSION:  12/24/2017  PRIMARY CARE PHYSICIAN: Patient, No Pcp Per   REQUESTING/REFERRING PHYSICIAN:   CHIEF COMPLAINT:   Chief Complaint  Patient presents with  . Cough    HISTORY OF PRESENT ILLNESS: Justin Reid  is a 37 y.o. male with a known history of per below presented to the emergency room with acute generalized weakness, fatigue, fevers, chills, general malaise, nasal congestion, cough, in the emergency room patient was febrile, tachycardic, potassium 3, sodium 132, influenza A positive noted, patient evaluated in the emergency room, wife at the bedside, patient appears toxic, noted low normal blood pressure, patient is now been admitted for acute sepsis secondary to acute influenza, acute electrolyte abnormalities.  PAST MEDICAL HISTORY:   Past Medical History:  Diagnosis Date  . Asthma   . Hypertension     PAST SURGICAL HISTORY:  none  SOCIAL HISTORY:  Social History   Tobacco Use  . Smoking status: Current Every Day Smoker    Packs/day: 0.25    Years: 5.00    Pack years: 1.25    Types: Cigarettes  . Smokeless tobacco: Never Used  Substance Use Topics  . Alcohol use: Yes    Comment: occasionally    FAMILY HISTORY:  Family History  Problem Relation Age of Onset  . Hypertension Father     DRUG ALLERGIES: No Known Allergies  REVIEW OF SYSTEMS:   CONSTITUTIONAL: + fever, fatigue, weakness.  EYES: No blurred or double vision.  EARS, NOSE, AND THROAT: No tinnitus or ear pain.  RESPIRATORY: + cough, shortness of breath, wheezing  CARDIOVASCULAR: No chest pain, orthopnea, edema.  GASTROINTESTINAL: No nausea, vomiting, diarrhea or abdominal pain.  GENITOURINARY: No dysuria, hematuria.  ENDOCRINE: No polyuria, nocturia,  HEMATOLOGY: No anemia, easy bruising or bleeding SKIN: No rash or lesion. MUSCULOSKELETAL:  No joint pain or arthritis.   NEUROLOGIC: No tingling, numbness, weakness.  PSYCHIATRY: No anxiety or depression.   MEDICATIONS AT HOME:  Prior to Admission medications   Medication Sig Start Date End Date Taking? Authorizing Provider  albuterol (PROVENTIL HFA;VENTOLIN HFA) 108 (90 Base) MCG/ACT inhaler Inhale 2 puffs into the lungs every 6 (six) hours as needed for wheezing or shortness of breath. 11/29/17  Yes Fisher, Roselyn BeringSusan W, PA-C  meloxicam (MOBIC) 15 MG tablet Take 1 tablet (15 mg total) by mouth daily. 11/29/17 11/29/18 Yes Fisher, Roselyn BeringSusan W, PA-C  Spacer/Aero-Holding Chambers (AEROCHAMBER MV) inhaler Use as instructed 07/13/17  Yes Willy Eddyobinson, Patrick, MD  famotidine (PEPCID) 20 MG tablet Take 1 tablet (20 mg total) by mouth 2 (two) times daily. Patient not taking: Reported on 12/24/2017 05/12/17   Sharman CheekStafford, Phillip, MD  omeprazole (PRILOSEC) 40 MG capsule Take 1 capsule (40 mg total) by mouth daily. Patient not taking: Reported on 12/24/2017 07/19/17 07/19/18  Myrna BlazerSchaevitz, David Matthew, MD      PHYSICAL EXAMINATION:   VITAL SIGNS: Blood pressure (!) 141/86, pulse 84, temperature 98.5 F (36.9 C), temperature source Oral, resp. rate 20, height 5\' 10"  (1.778 m), weight 124.7 kg, SpO2 96 %.  GENERAL:  37 y.o.-year-old patient lying in the bed with no acute distress.  Obese, toxic appearing EYES: Pupils equal, round, reactive to light and accommodation. No scleral icterus. Extraocular muscles intact.  HEENT: Head atraumatic, normocephalic. Oropharynx and nasopharynx clear.  NECK:  Supple, no jugular venous distention. No thyroid enlargement, no  tenderness.  LUNGS: Wheezing/rhonchi bilaterally. No use of accessory muscles of respiration.  CARDIOVASCULAR: S1, S2 normal. No murmurs, rubs, or gallops.  ABDOMEN: Soft, nontender, nondistended. Bowel sounds present. No organomegaly or mass.  EXTREMITIES: No pedal edema, cyanosis, or clubbing.  NEUROLOGIC: Cranial nerves II through XII are intact.  Muscle strength 5/5 in all extremities. Sensation intact. Gait not checked.  PSYCHIATRIC: The patient is lethargic oriented x 3.  SKIN: No obvious rash, lesion, or ulcer.   LABORATORY PANEL:   CBC Recent Labs  Lab 12/24/17 1850  WBC 6.4  HGB 12.7*  HCT 40.0  PLT 342  MCV 85.1  MCH 27.0  MCHC 31.8  RDW 13.3  LYMPHSABS 0.8  MONOABS 0.7  EOSABS 0.3  BASOSABS 0.0   ------------------------------------------------------------------------------------------------------------------  Chemistries  Recent Labs  Lab 12/24/17 1850  NA 132*  K 3.0*  CL 99  CO2 25  GLUCOSE 97  BUN 14  CREATININE 1.05  CALCIUM 8.4*  MG 2.0   ------------------------------------------------------------------------------------------------------------------ estimated creatinine clearance is 128.9 mL/min (by C-G formula based on SCr of 1.05 mg/dL). ------------------------------------------------------------------------------------------------------------------ No results for input(s): TSH, T4TOTAL, T3FREE, THYROIDAB in the last 72 hours.  Invalid input(s): FREET3   Coagulation profile No results for input(s): INR, PROTIME in the last 168 hours. ------------------------------------------------------------------------------------------------------------------- No results for input(s): DDIMER in the last 72 hours. -------------------------------------------------------------------------------------------------------------------  Cardiac Enzymes No results for input(s): CKMB, TROPONINI, MYOGLOBIN in the last 168 hours.  Invalid input(s): CK ------------------------------------------------------------------------------------------------------------------ Invalid input(s): POCBNP  ---------------------------------------------------------------------------------------------------------------  Urinalysis No results found for: COLORURINE, APPEARANCEUR, LABSPEC, PHURINE, GLUCOSEU, HGBUR,  BILIRUBINUR, KETONESUR, PROTEINUR, UROBILINOGEN, NITRITE, LEUKOCYTESUR   RADIOLOGY: Dg Chest 2 View  Result Date: 12/24/2017 CLINICAL DATA:  Cough, chills and congestion beginning today. EXAM: CHEST - 2 VIEW COMPARISON:  11/29/2017 FINDINGS: Heart size is normal. There is bronchial thickening consistent with bronchitis. No consolidation or collapse. No effusions. Bony structures unremarkable. IMPRESSION: Bronchitis pattern. No consolidation or collapse. Electronically Signed   By: Paulina Fusi M.D.   On: 12/24/2017 19:58    EKG: Orders placed or performed during the hospital encounter of 12/24/17  . ED EKG  . ED EKG  . EKG 12-Lead  . EKG 12-Lead    IMPRESSION AND PLAN: *Acute sepsis secondary to influenza a *Acute influenza A infection *Acute hypokalemia/hyponatremia *Chronic tobacco smoker abuse/dependency *Asthma with minimal exacerbation  Admit to regular nursing for bed, droplet precautions, Tamiflu twice daily, aggressive pulmonary toilet with bronchodilator therapy, replete electrolyte abnormalities, tobacco smoking cessation ordered, nicotine patch, IV fluids for rehydration, and continue close medical monitoring   All the records are reviewed and case discussed with ED provider. Management plans discussed with the patient, family and they are in agreement.  CODE STATUS:full    Code Status Orders  (From admission, onward)         Start     Ordered   12/24/17 2303  Full code  Continuous     12/24/17 2302        Code Status History    Date Active Date Inactive Code Status Order ID Comments User Context   07/06/2016 2042 07/08/2016 1424 Full Code [id number]  Shaune Pollack, MD Inpatient   02/20/2016 4098 02/23/2016 1521 Full Code [id number]  Hugelmeyer, Jon Gills, DO Inpatient       TOTAL TIME TAKING CARE OF THIS PATIENT: 35 minutes.    Evelena Asa Waneda Klammer M.D on 12/24/2017   Between 7am to 6pm - Pager - [phone]  After 6pm go to [email or web address] - password [email or web address]  [facility]  [facility]  Office  [phone]  CC: Primary care physician; Patient, No Pcp Per   Note: This dictation was prepared with Dragon dictation along with smaller phrase technology. Any transcriptional errors that result from this process are unintentional.

## 2017-12-24 NOTE — ED Provider Notes (Signed)
[facility] Emergency Department Provider Note ____________________________________________   First MD Initiated Contact with Patient 12/24/17 1858     (approximate)  I have reviewed the triage vital signs and the nursing notes.   HISTORY  Chief Complaint Cough    HPI Kenn FileSteven D Plancarte is a 37 y.o. male with PMH of asthma and hypertension who presents with cough since yesterday evening, associated with shortness of breath, wheezing, body aches, and malaise which started today.  He also reports nasal congestion and chills.  He denies sick contacts or other recent illness.  His symptoms were not resolved by over-the-counter medications at home.   Past Medical History:  Diagnosis Date  . Asthma   . Hypertension     Patient Active Problem List   Diagnosis Date Noted  . Sepsis (HCC) 12/24/2017  . Asthma 07/07/2016  . Asthma exacerbation 02/20/2016    History reviewed. No pertinent surgical history.  Prior to Admission medications   Medication Sig Start Date End Date Taking? Authorizing Provider  albuterol (PROVENTIL HFA;VENTOLIN HFA) 108 (90 Base) MCG/ACT inhaler Inhale 2 puffs into the lungs every 6 (six) hours as needed for wheezing or shortness of breath. 11/29/17  Yes Fisher, Roselyn BeringSusan W, PA-C  meloxicam (MOBIC) 15 MG tablet Take 1 tablet (15 mg total) by mouth daily. 11/29/17 11/29/18 Yes Fisher, Roselyn BeringSusan W, PA-C  Spacer/Aero-Holding Chambers (AEROCHAMBER MV) inhaler Use as instructed 07/13/17  Yes Willy Eddyobinson, Patrick, MD  famotidine (PEPCID) 20 MG tablet Take 1 tablet (20 mg total) by mouth 2 (two) times daily. Patient not taking: Reported on 12/24/2017 05/12/17   Sharman CheekStafford, Phillip, MD  omeprazole (PRILOSEC) 40 MG capsule Take 1 capsule (40 mg total) by mouth daily. Patient not taking: Reported on 12/24/2017 07/19/17 07/19/18  Myrna BlazerSchaevitz, David Matthew, MD    Allergies Patient has no known allergies.  Family History  Problem Relation Age of Onset  .  Hypertension Father     Social History Social History   Tobacco Use  . Smoking status: Current Every Day Smoker    Packs/day: 0.25    Years: 5.00    Pack years: 1.25    Types: Cigarettes  . Smokeless tobacco: Never Used  Substance Use Topics  . Alcohol use: Yes    Comment: occasionally  . Drug use: No    Review of Systems  Constitutional: Positive for chills. Eyes: No redness. ENT: Positive for congestion. Cardiovascular: Denies chest pain. Respiratory: Positive for shortness of breath. Gastrointestinal: No vomiting or diarrhea.  Genitourinary: Negative for flank pain.  Musculoskeletal: Positive for body aches. Skin: Negative for rash. Neurological: Positive for headache   ____________________________________________   PHYSICAL EXAM:  VITAL SIGNS: ED Triage Vitals [12/24/17 1845]  Enc Vitals Group     BP (!) 142/93     Pulse Rate 99     Resp 20     Temp (!) 101.8 F (38.8 C)     Temp src      SpO2 97 %     Weight 275 lb (124.7 kg)     Height 5\' 10"  (1.778 m)     Head Circumference      Peak Flow      Pain Score 0     Pain Loc      Pain Edu?      Excl. in GC?     Constitutional: Alert and oriented.  Uncomfortable appearing but in no acute distress. Eyes: Conjunctivae are normal.  Head: Atraumatic. Nose: No congestion/rhinnorhea. Mouth/Throat: Mucous  membranes are slightly dry Neck: Normal range of motion.  Cardiovascular: Normal rate, regular rhythm. Grossly normal heart sounds.  Good peripheral circulation. Respiratory: Slightly increased respiratory effort.  Diffuse wheezing bilaterally. Gastrointestinal:  No distention.  Musculoskeletal: Extremities warm and well perfused.  Neurologic:  Normal speech and language. No gross focal neurologic deficits are appreciated.  Skin:  Skin is warm and dry. No rash noted. Psychiatric: Mood and affect are normal. Speech and behavior are normal.  ____________________________________________   LABS (all  labs ordered are listed, but only abnormal results are displayed)  Labs Reviewed  INFLUENZA PANEL BY PCR (TYPE A & B) - Abnormal; Notable for the following components:      Result Value   Influenza A By PCR POSITIVE (*)    All other components within normal limits  BASIC METABOLIC PANEL - Abnormal; Notable for the following components:   Sodium 132 (*)    Potassium 3.0 (*)    Calcium 8.4 (*)    All other components within normal limits  CBC WITH DIFFERENTIAL/PLATELET - Abnormal; Notable for the following components:   Hemoglobin 12.7 (*)    All other components within normal limits  MAGNESIUM  BASIC METABOLIC PANEL  HIV ANTIBODY (ROUTINE TESTING W REFLEX)  PROTIME-INR  CORTISOL-AM, BLOOD  PROCALCITONIN  MAGNESIUM   ____________________________________________  EKG  ED ECG REPORT I, Dionne Bucy, the attending physician, personally viewed and interpreted this ECG.  Date: 12/24/2017 EKG Time: 1924 Rate: 93 Rhythm: normal sinus rhythm QRS Axis: normal Intervals: normal ST/T Wave abnormalities: normal Narrative Interpretation: no evidence of acute ischemia  ____________________________________________  RADIOLOGY  CXR: No focal infiltrate  ____________________________________________   PROCEDURES  Procedure(s) performed: No  Procedures  Critical Care performed: No ____________________________________________   INITIAL IMPRESSION / ASSESSMENT AND PLAN / ED COURSE  Pertinent labs & imaging results that were available during my care of the patient were reviewed by me and considered in my medical decision making (see chart for details).  37 year old male with history of asthma presents with cough since last night, with chills, malaise, body aches, congestion, and shortness of breath today.  On exam the patient is febrile with otherwise normal vital signs.  O2 saturation is in the mid 90s on room air.  He has diffuse wheezing and slightly increased work of  breathing but no respiratory distress.  He is able to speak in full sentences.  Differential includes viral bronchitis with resulting asthma exacerbation, influenza, pneumonia.  We will obtain chest x-ray, labs, flu swab, and give fluids, Toradol, bronchodilators, Solu-Medrol, and reassess.  ----------------------------------------- 12:24 AM on 12/25/2017 -----------------------------------------  Work-up revealed flu A.  The patient is also borderline hypokalemic.  On reassessment, his wheezing had improved but not resolved.  He is respiratory rate also improved although he remained with somewhat increased work of breathing.  I discussed the disposition with him.  The patient is concerned that he does not have albuterol at home and still feels relatively weak and short of breath.  He would feel more comfortable being mended.  I think that this is reasonable given that he has flu with acute symptoms and a history of asthma with acute exacerbation.  I signed the patient out to the hospitalist Dr. Katheren Shams. ____________________________________________   FINAL CLINICAL IMPRESSION(S) / ED DIAGNOSES  Final diagnoses:  Influenza A  Acute bronchitis due to other specified organisms      NEW MEDICATIONS STARTED DURING THIS VISIT:  Current Discharge Medication List  Note:  This document was prepared using Dragon voice recognition software and may include unintentional dictation errors.    Dionne BucySiadecki, Kaydyn Sayas, MD 12/25/17 Moses [email or web address]

## 2017-12-24 NOTE — ED Notes (Signed)
Patient's wife is at bedside. Patient was given cup of water. Patient appears to be more comfortable after 2nd Duoneb treatment.

## 2017-12-24 NOTE — Progress Notes (Signed)
Family Meeting Note  Advance Directive:yes  Today a meeting took place with the Patient.  Patient is able to participate   The following clinical team members were present during this meeting:MD  The following were discussed:Patient's diagnosis: Acute influenza, asthma, Patient's progosis: Unable to determine and Goals for treatment: Full Code  Additional follow-up to be provided: prn  Time spent during discussion:20 minutes  Bertrum SolMontell D Mavi Un, MD

## 2017-12-25 LAB — BASIC METABOLIC PANEL
Anion gap: 8 (ref 5–15)
BUN: 12 mg/dL (ref 6–20)
CO2: 23 mmol/L (ref 22–32)
Calcium: 8.8 mg/dL — ABNORMAL LOW (ref 8.9–10.3)
Chloride: 106 mmol/L (ref 98–111)
Creatinine, Ser: 0.95 mg/dL (ref 0.61–1.24)
GFR calc Af Amer: >60 mL/min (ref >60)
GFR calc non Af Amer: >60 mL/min (ref >60)
Glucose, Bld: 156 mg/dL — ABNORMAL HIGH (ref 70–99)
POTASSIUM: 4.2 mmol/L (ref 3.5–5.1)
Sodium: 137 mmol/L (ref 135–145)

## 2017-12-25 LAB — PROTIME-INR
INR: 1.02
Prothrombin Time: 13.3 seconds (ref 11.4–15.2)

## 2017-12-25 LAB — CORTISOL-AM, BLOOD: Cortisol - AM: 2.8 ug/dL — ABNORMAL LOW (ref 6.7–22.6)

## 2017-12-25 LAB — PROCALCITONIN: Procalcitonin: <0.1 ng/mL

## 2017-12-25 LAB — MAGNESIUM: Magnesium: 2.4 mg/dL (ref 1.7–2.4)

## 2017-12-25 MED ORDER — PNEUMOCOCCAL VAC POLYVALENT 25 MCG/0.5ML IJ INJ: 0.5000 mL | INJECTION | INTRAMUSCULAR | Status: DC

## 2017-12-25 MED ORDER — SODIUM CHLORIDE 0.45 % IV SOLN
INTRAVENOUS | Status: DC
  Administered 2017-12-25: 20:00:00 via INTRAVENOUS

## 2017-12-25 MED ORDER — SODIUM CHLORIDE 0.9 % IV SOLN
INTRAVENOUS | Status: DC
  Administered 2017-12-25: 14:00:00 via INTRAVENOUS

## 2017-12-25 MED ORDER — INFLUENZA VAC SPLIT QUAD 0.5 ML IM SUSY
0.5000 mL | PREFILLED_SYRINGE | INTRAMUSCULAR | Status: AC
  Administered 2017-12-26: 0.5 mL via INTRAMUSCULAR
  Filled 2017-12-25: qty 0.5

## 2017-12-25 NOTE — Progress Notes (Signed)
[facility] [place] at [facility]   PATIENT NAME: Justin Reid    MR#:  [id number]  DATE OF BIRTH:  Dec 22, 1980  SUBJECTIVE:  Patient continues to complain of generalized weakness, fatigue, difficulty with ambulation, intermittent shortness of breath, wife at the bedside  REVIEW OF SYSTEMS:  CONSTITUTIONAL: No fever, fatigue or weakness.  EYES: No blurred or double vision.  EARS, NOSE, AND THROAT: No tinnitus or ear pain.  RESPIRATORY: No cough, shortness of breath, wheezing or hemoptysis.  CARDIOVASCULAR: No chest pain, orthopnea, edema.  GASTROINTESTINAL: No nausea, vomiting, diarrhea or abdominal pain.  GENITOURINARY: No dysuria, hematuria.  ENDOCRINE: No polyuria, nocturia,  HEMATOLOGY: No anemia, easy bruising or bleeding SKIN: No rash or lesion. MUSCULOSKELETAL: No joint pain or arthritis.   NEUROLOGIC: No tingling, numbness, weakness.  PSYCHIATRY: No anxiety or depression.   ROS  DRUG ALLERGIES:  No Known Allergies  VITALS:  Blood pressure (!) 132/92, pulse 61, temperature (!) 97.5 F (36.4 C), temperature source Oral, resp. rate 15, height 5\' 10"  (1.778 m), weight 124.7 kg, SpO2 96 %.  PHYSICAL EXAMINATION:  GENERAL:  37 y.o.-year-old patient lying in the bed with no acute distress.  EYES: Pupils equal, round, reactive to light and accommodation. No scleral icterus. Extraocular muscles intact.  HEENT: Head atraumatic, normocephalic. Oropharynx and nasopharynx clear.  NECK:  Supple, no jugular venous distention. No thyroid enlargement, no tenderness.  LUNGS: Normal breath sounds bilaterally, no wheezing, rales,rhonchi or crepitation. No use of accessory muscles of respiration.  CARDIOVASCULAR: S1, S2 normal. No murmurs, rubs, or gallops.  ABDOMEN: Soft, nontender, nondistended. Bowel sounds present. No organomegaly or mass.  EXTREMITIES: No pedal edema, cyanosis, or clubbing.  NEUROLOGIC: Cranial nerves II through XII are intact. Muscle strength  5/5 in all extremities. Sensation intact. Gait not checked.  PSYCHIATRIC: The patient is alert and oriented x 3.  SKIN: No obvious rash, lesion, or ulcer.   Physical Exam LABORATORY PANEL:   CBC Recent Labs  Lab 12/24/17 1850  WBC 6.4  HGB 12.7*  HCT 40.0  PLT 342   ------------------------------------------------------------------------------------------------------------------  Chemistries  Recent Labs  Lab 12/25/17 0517  NA 137  K 4.2  CL 106  CO2 23  GLUCOSE 156*  BUN 12  CREATININE 0.95  CALCIUM 8.8*  MG 2.4   ------------------------------------------------------------------------------------------------------------------  Cardiac Enzymes No results for input(s): TROPONINI in the last 168 hours. ------------------------------------------------------------------------------------------------------------------  RADIOLOGY:  Dg Chest 2 View  Result Date: 12/24/2017 CLINICAL DATA:  Cough, chills and congestion beginning today. EXAM: CHEST - 2 VIEW COMPARISON:  11/29/2017 FINDINGS: Heart size is normal. There is bronchial thickening consistent with bronchitis. No consolidation or collapse. No effusions. Bony structures unremarkable. IMPRESSION: Bronchitis pattern. No consolidation or collapse. Electronically Signed   By: Paulina FusiMark  Shogry M.D.   On: 12/24/2017 19:58    ASSESSMENT AND PLAN:  *Acute sepsis secondary to influenza a Resolved with IV fluids for rehydration Continue sepsis protocol  *Acute influenza A infection Slightly improved Continue Tamiflu, supportive care, IV fluids for rehydration, increase activity as tolerated  *Acute hypokalemia/hyponatremia Repleted with p.o. potassium and IV fluids for rehydration  *Chronic tobacco smoker abuse/dependency Stable Tobacco cessation counseling ordered, nicotine patch daily  *Asthma with minimal exacerbation Exacerbated by influenza and tobacco smoking Continue breathing treatments as needed, inhaled  corticosteroids twice daily  Disposition home on tomorrow barring any complications  All the records are reviewed and case discussed with Care Management/Social Workerr. Management plans discussed with the patient, family and they are in  agreement.  CODE STATUS: ful  TOTAL TIME TAKING CARE OF THIS PATIENT: 35 minutes.     POSSIBLE D/C IN 1 DAYS, DEPENDING ON CLINICAL CONDITION.   Evelena AsaMontell D Bruin Bolger M.D on 12/25/2017   Between 7am to 6pm - Pager - [phone]  After 6pm go to [email or web address] - password [email or web address]  [facility] [place] [facility]  Office  [phone]  CC: Primary care physician; Patient, No Pcp Per  Note: This dictation was prepared with Dragon dictation along with smaller phrase technology. Any transcriptional errors that result from this process are unintentional.

## 2017-12-25 NOTE — Progress Notes (Signed)
I paged hospitalist on call, Dr Allena KatzPatel and advised him of patient's blood pressure of 140/103. Patient not having any sx's of elevated BP. He advised to decrease normal saline to .45 % and decrease rate to 50 ml/hr. Patient also wished to have IV moved to another location since it keeps beeping. IV team consult ordered.

## 2017-12-25 NOTE — Plan of Care (Signed)
12/25/2017 3:28 PM  [name] respiratory function is improving, but lethargy and muscle aches are still present. IV hydration started, but also encouraged oral hydration. Continue to administer analgesics for muscle pain. Will continue to monitor.   Madie RenoMisty D Celsa Nordahl, RN

## 2017-12-26 MED ORDER — GUAIFENESIN-DM 100-10 MG/5ML PO SYRP
5.0000 mL | ORAL_SOLUTION | ORAL | Status: DC | PRN
  Administered 2017-12-26 (×2): 5 mL via ORAL
  Filled 2017-12-26 (×2): qty 5

## 2017-12-26 MED ORDER — OSELTAMIVIR PHOSPHATE 75 MG PO CAPS: 75.0000 mg | ORAL_CAPSULE | Freq: Two times a day (BID) | ORAL | 0 refills | Status: DC

## 2017-12-26 MED ORDER — NICOTINE 14 MG/24HR TD PT24: 14.0000 mg | MEDICATED_PATCH | Freq: Every day | TRANSDERMAL | 0 refills | Status: DC

## 2017-12-26 NOTE — Progress Notes (Signed)
Discharge instructions explained to pt/ verbalized an understanding/ iv removed/ RX and work note given to pt/ refused wheelchair/ pt ambulated off unit .

## 2017-12-26 NOTE — Discharge Summary (Signed)
[facility] [place] at [facility]   PATIENT NAME: Justin NorthSteven Reid    MR#:  [id number]  DATE OF BIRTH:  08-Mar-1980  DATE OF ADMISSION:  12/24/2017 ADMITTING PHYSICIAN: Evelena AsaMontell D Rhoderick Farrel, MD  DATE OF DISCHARGE: No discharge date for patient encounter.  PRIMARY CARE PHYSICIAN: Patient, No Pcp Per    ADMISSION DIAGNOSIS:  Influenza A [J10.1] Acute bronchitis due to other specified organisms [J20.8]  DISCHARGE DIAGNOSIS:  Active Problems:   Sepsis (HCC)   SECONDARY DIAGNOSIS:   Past Medical History:  Diagnosis Date  . Asthma   . Hypertension     HOSPITAL COURSE:  *Acute sepsis secondary to influenza a Resolved with IV fluids for rehydration Treated on our sepsis protocol  *Acute influenza A infection Resolving Continue Tamiflu, provided supportive care while in house, and patient did well  *Acute hypokalemia/hyponatremia Repleted with p.o. potassium and IV fluids for rehydration  *Chronic tobacco smoker abuse/dependency Stable Tobacco cessation counseling ordered, nicotine patch daily  *Asthma with minimal exacerbation Exacerbated by influenza and tobacco smoking Provided breathing treatments as needed, inhaled corticosteroids twice daily while in house   DISCHARGE CONDITIONS:   strable  CONSULTS OBTAINED:    DRUG ALLERGIES:  No Known Allergies  DISCHARGE MEDICATIONS:   Allergies as of 12/26/2017   No Known Allergies     Medication List    TAKE these medications   AEROCHAMBER MV inhaler Use as instructed   albuterol 108 (90 Base) MCG/ACT inhaler Commonly known as:  PROVENTIL HFA;VENTOLIN HFA Inhale 2 puffs into the lungs every 6 (six) hours as needed for wheezing or shortness of breath.   famotidine 20 MG tablet Commonly known as:  PEPCID Take 1 tablet (20 mg total) by mouth 2 (two) times daily.   meloxicam 15 MG tablet Commonly known as:  MOBIC Take 1 tablet (15 mg total) by mouth daily.   nicotine 14  mg/24hr patch Commonly known as:  NICODERM CQ - dosed in mg/24 hours Place 1 patch (14 mg total) onto the skin daily. Start taking on:  December 27, 2017   omeprazole 40 MG capsule Commonly known as:  PRILOSEC Take 1 capsule (40 mg total) by mouth daily.   oseltamivir 75 MG capsule Commonly known as:  TAMIFLU Take 1 capsule (75 mg total) by mouth 2 (two) times daily.        DISCHARGE INSTRUCTIONS:    If you experience worsening of your admission symptoms, develop shortness of breath, life threatening emergency, suicidal or homicidal thoughts you must seek medical attention immediately by calling 911 or calling your MD immediately  if symptoms less severe.  You Must read complete instructions/literature along with all the possible adverse reactions/side effects for all the Medicines you take and that have been prescribed to you. Take any new Medicines after you have completely understood and accept all the possible adverse reactions/side effects.   Please note  You were cared for by a hospitalist during your hospital stay. If you have any questions about your discharge medications or the care you received while you were in the hospital after you are discharged, you can call the unit and asked to speak with the hospitalist on call if the hospitalist that took care of you is not available. Once you are discharged, your primary care physician will handle any further medical issues. Please note that NO REFILLS for any discharge medications will be authorized once you are discharged, as it is imperative that you return to your primary care  physician (or establish a relationship with a primary care physician if you do not have one) for your aftercare needs so that they can reassess your need for medications and monitor your lab values.    Today   CHIEF COMPLAINT:   Chief Complaint  Patient presents with  . Cough    HISTORY OF PRESENT ILLNESS:   Justin Reid  is a 37 y.o. male with  a known history of per below presented to the emergency room with acute generalized weakness, fatigue, fevers, chills, general malaise, nasal congestion, cough, in the emergency room patient was febrile, tachycardic, potassium 3, sodium 132, influenza A positive noted, patient evaluated in the emergency room, wife at the bedside, patient appears toxic, noted low normal blood pressure, patient is now been admitted for acute sepsis secondary to acute influenza, acute electrolyte abnormalities.   VITAL SIGNS:  Blood pressure (!) 144/101, pulse 78, temperature 99.3 F (37.4 C), temperature source Oral, resp. rate 20, height 5\' 10"  (1.778 m), weight 124.7 kg, SpO2 100 %.  I/O:    Intake/Output Summary (Last 24 hours) at 12/26/2017 0958 Last data filed at 12/26/2017 0556 Gross per 24 hour  Intake 1333.38 ml  Output -  Net 1333.38 ml    PHYSICAL EXAMINATION:  GENERAL:  37 y.o.-year-old patient lying in the bed with no acute distress.  EYES: Pupils equal, round, reactive to light and accommodation. No scleral icterus. Extraocular muscles intact.  HEENT: Head atraumatic, normocephalic. Oropharynx and nasopharynx clear.  NECK:  Supple, no jugular venous distention. No thyroid enlargement, no tenderness.  LUNGS: Normal breath sounds bilaterally, no wheezing, rales,rhonchi or crepitation. No use of accessory muscles of respiration.  CARDIOVASCULAR: S1, S2 normal. No murmurs, rubs, or gallops.  ABDOMEN: Soft, non-tender, non-distended. Bowel sounds present. No organomegaly or mass.  EXTREMITIES: No pedal edema, cyanosis, or clubbing.  NEUROLOGIC: Cranial nerves II through XII are intact. Muscle strength 5/5 in all extremities. Sensation intact. Gait not checked.  PSYCHIATRIC: The patient is alert and oriented x 3.  SKIN: No obvious rash, lesion, or ulcer.   DATA REVIEW:   CBC Recent Labs  Lab 12/24/17 1850  WBC 6.4  HGB 12.7*  HCT 40.0  PLT 342    Chemistries  Recent Labs  Lab  12/25/17 0517  NA 137  K 4.2  CL 106  CO2 23  GLUCOSE 156*  BUN 12  CREATININE 0.95  CALCIUM 8.8*  MG 2.4    Cardiac Enzymes No results for input(s): TROPONINI in the last 168 hours.  Microbiology Results  Results for orders placed or performed during the hospital encounter of 02/20/16  MRSA PCR Screening     Status: None   Collection Time: 02/20/16  6:33 AM  Result Value Ref Range Status   MRSA by PCR NEGATIVE NEGATIVE Final    Comment:        The GeneXpert MRSA Assay (FDA approved for NASAL specimens only), is one component of a comprehensive MRSA colonization surveillance program. It is not intended to diagnose MRSA infection nor to guide or monitor treatment for MRSA infections.     RADIOLOGY:  Dg Chest 2 View  Result Date: 12/24/2017 CLINICAL DATA:  Cough, chills and congestion beginning today. EXAM: CHEST - 2 VIEW COMPARISON:  11/29/2017 FINDINGS: Heart size is normal. There is bronchial thickening consistent with bronchitis. No consolidation or collapse. No effusions. Bony structures unremarkable. IMPRESSION: Bronchitis pattern. No consolidation or collapse. Electronically Signed   By: Paulina FusiMark  Shogry M.D.   On:  12/24/2017 19:58    EKG:   Orders placed or performed during the hospital encounter of 12/24/17  . ED EKG  . ED EKG  . EKG 12-Lead  . EKG 12-Lead      Management plans discussed with the patient, family and they are in agreement.  CODE STATUS:     Code Status Orders  (From admission, onward)         Start     Ordered   12/24/17 2303  Full code  Continuous     12/24/17 2302        Code Status History    Date Active Date Inactive Code Status Order ID Comments User Context   07/06/2016 2042 07/08/2016 1424 Full Code [id number]  Shaune Pollack, MD Inpatient   02/20/2016 4403 02/23/2016 1521 Full Code [id number]  Hugelmeyer, Jon Gills, DO Inpatient      TOTAL TIME TAKING CARE OF THIS PATIENT: 40 minutes.    Evelena Asa Emrah Ariola M.D on 12/26/2017 at  9:58 AM  Between 7am to 6pm - Pager - [phone]  After 6pm go to [email or web address] - password [email or web address]  [facility] [place] [facility]  Office  [phone]  CC: Primary care physician; Patient, No Pcp Per   Note: This dictation was prepared with Dragon dictation along with smaller phrase technology. Any transcriptional errors that result from this process are unintentional.

## 2017-12-27 LAB — HIV ANTIBODY (ROUTINE TESTING W REFLEX): HIV Screen 4th Generation wRfx: NONREACTIVE

## 2018-01-03 ENCOUNTER — Other Ambulatory Visit: Payer: Self-pay

## 2018-01-03 ENCOUNTER — Encounter: Payer: Self-pay | Admitting: Emergency Medicine

## 2018-01-03 ENCOUNTER — Emergency Department
Admission: EM | Admit: 2018-01-03 | Discharge: 2018-01-03 | Disposition: A | Discharge status: Home / Self Care | Payer: Self-pay | Attending: Student in an Organized Health Care Education/Training Program | Admitting: Student in an Organized Health Care Education/Training Program

## 2018-01-03 DIAGNOSIS — J45909 Unspecified asthma, uncomplicated: Secondary | ICD-10-CM | POA: Insufficient documentation

## 2018-01-03 DIAGNOSIS — Z79899 Other long term (current) drug therapy: Secondary | ICD-10-CM | POA: Insufficient documentation

## 2018-01-03 DIAGNOSIS — M722 Plantar fascial fibromatosis: Secondary | ICD-10-CM | POA: Insufficient documentation

## 2018-01-03 DIAGNOSIS — I10 Essential (primary) hypertension: Secondary | ICD-10-CM | POA: Insufficient documentation

## 2018-01-03 DIAGNOSIS — F17210 Nicotine dependence, cigarettes, uncomplicated: Secondary | ICD-10-CM | POA: Insufficient documentation

## 2018-01-03 MED ORDER — IBUPROFEN 800 MG PO TABS: 800.0000 mg | ORAL_TABLET | Freq: Three times a day (TID) | ORAL | 0 refills | Status: DC | PRN

## 2018-01-03 NOTE — ED Provider Notes (Signed)
[facility] Emergency Department Provider Note ____________________________________________  Time seen: 1325  I have reviewed the triage vital signs and the nursing notes.  HISTORY  Chief Complaint  Foot Pain  HPI Kenn FileSteven D Clouatre is a 37 y.o. male who presents himself to the ED for evaluation of pain to the plantar surface of the left foot.  Patient reports active for about the last 4 weeks.  He denies any preceding injury, accident, or trauma.  He describes pain to the medial foot near the arch and plantar surface.  He describes a sudden increase in his pain yesterday.  He also notes the pain is worse in the mornings upon awakening.  He works 2 jobs, both that require a significant amount of standing, but denies a either job is new in the last 2 months.  He has attempted to alleviate his symptoms using over-the-counter shoe inserts, but denies any significant benefit.  He is here without any pain to the calf, legs, back, or chest.  Past Medical History:  Diagnosis Date  . Asthma   . Hypertension     Patient Active Problem List   Diagnosis Date Noted  . Sepsis (HCC) 12/24/2017  . Asthma 07/07/2016  . Asthma exacerbation 02/20/2016    History reviewed. No pertinent surgical history.  Prior to Admission medications   Medication Sig Start Date End Date Taking? Authorizing Provider  albuterol (PROVENTIL HFA;VENTOLIN HFA) 108 (90 Base) MCG/ACT inhaler Inhale 2 puffs into the lungs every 6 (six) hours as needed for wheezing or shortness of breath. 11/29/17   Fisher, Roselyn BeringSusan W, PA-C  famotidine (PEPCID) 20 MG tablet Take 1 tablet (20 mg total) by mouth 2 (two) times daily. Patient not taking: Reported on 12/24/2017 05/12/17   Sharman CheekStafford, Phillip, MD  ibuprofen (ADVIL,MOTRIN) 800 MG tablet Take 1 tablet (800 mg total) by mouth every 8 (eight) hours as needed. 01/03/18   Alyah Boehning, Charlesetta IvoryJenise V Bacon, PA-C  meloxicam (MOBIC) 15 MG tablet Take 1 tablet (15 mg total) by mouth  daily. 11/29/17 11/29/18  Fisher, Roselyn BeringSusan W, PA-C  nicotine (NICODERM CQ - DOSED IN MG/24 HOURS) 14 mg/24hr patch Place 1 patch (14 mg total) onto the skin daily. 12/27/17   Salary, Evelena AsaMontell D, MD  omeprazole (PRILOSEC) 40 MG capsule Take 1 capsule (40 mg total) by mouth daily. Patient not taking: Reported on 12/24/2017 07/19/17 07/19/18  Myrna BlazerSchaevitz, David Matthew, MD  oseltamivir (TAMIFLU) 75 MG capsule Take 1 capsule (75 mg total) by mouth 2 (two) times daily. 12/26/17   Salary, Evelena AsaMontell D, MD  Spacer/Aero-Holding Chambers (AEROCHAMBER MV) inhaler Use as instructed 07/13/17   Willy Eddyobinson, Patrick, MD    Allergies Patient has no known allergies.  Family History  Problem Relation Age of Onset  . Hypertension Father     Social History Social History   Tobacco Use  . Smoking status: Current Every Day Smoker    Packs/day: 0.25    Years: 5.00    Pack years: 1.25    Types: Cigarettes  . Smokeless tobacco: Never Used  Substance Use Topics  . Alcohol use: Yes    Comment: occasionally  . Drug use: No    Review of Systems  Constitutional: Negative for fever. Cardiovascular: Negative for chest pain. Respiratory: Negative for shortness of breath. Musculoskeletal: Negative for back pain.  Left foot pain as above. Skin: Negative for rash. Neurological: Negative for headaches, focal weakness or numbness. ____________________________________________  PHYSICAL EXAM:  VITAL SIGNS: ED Triage Vitals  Enc Vitals Group  BP 01/03/18 1211 (!) 145/91     Pulse Rate 01/03/18 1211 85     Resp 01/03/18 1211 20     Temp 01/03/18 1211 98.5 F (36.9 C)     Temp Source 01/03/18 1211 Oral     SpO2 01/03/18 1211 95 %     Weight 01/03/18 1213 275 lb (124.7 kg)     Height 01/03/18 1213 5\' 10"  (1.778 m)     Head Circumference --      Peak Flow --      Pain Score 01/03/18 1212 10     Pain Loc --      Pain Edu? --      Excl. in GC? --     Constitutional: Alert and oriented. Well appearing and in no  distress. Head: Normocephalic and atraumatic. Eyes: Conjunctivae are normal. Normal extraocular movements Cardiovascular: Normal rate, regular rhythm. Normal distal pulses. Respiratory: Normal respiratory effort. No wheezes/rales/rhonchi. Musculoskeletal: Left foot without any obvious deformity or dislocation.  Patient with normal ankle range of motion on exam.  No calf or Achilles tenderness is elicited.  He does have some tenderness to palpation to the medial aspect of the calcaneal heel with compression.  Forced dorsiflexion of the toes also elicits pain to the plantar surface.  Nontender with normal range of motion in all extremities.  Neurologic:  Normal gait without ataxia. Normal speech and language. No gross focal neurologic deficits are appreciated. Skin:  Skin is warm, dry and intact. No rash noted. ____________________________________________   RADIOLOGY  deferred ____________________________________________  PROCEDURES  Procedures Ace bandage.  ____________________________________________  INITIAL IMPRESSION / ASSESSMENT AND PLAN / ED COURSE  Patient with ED evaluation of 1 month complaint of plantar surface pain on the left foot.  Patient's clinical picture is concerning for an early left plantar fasciitis.  Patient has declined x-rays at this time which is appropriate since he has no trauma preceding his injuries.  He is referred at this time to podiatry for further evaluation management of his symptoms.  He is given an Ace bandage for support, and a prescription for 800 mg ibuprofen to take as directed.  Return precautions have been reviewed. ____________________________________________  FINAL CLINICAL IMPRESSION(S) / ED DIAGNOSES  Final diagnoses:  Plantar fasciitis of left foot      Karmen StabsMenshew, Charlesetta IvoryJenise V Bacon, PA-C 01/03/18 1842    Willy Eddyobinson, Patrick, MD 01/05/18 [phone]

## 2018-01-03 NOTE — ED Notes (Signed)
See triage note  Presents with pain to right foot  No injury  Pain is worse with standing

## 2018-01-03 NOTE — ED Triage Notes (Signed)
Patient complaining of pain bottom of right foot near the arch.  States pain has been present X 1 month worse yesterday.  States pain is worse in the morning.  Denies injury.

## 2018-01-03 NOTE — Discharge Instructions (Addendum)
Your symptoms are consistent with plantar fasciitis. This is inflammation of the connective tissue under the foot. Take the ibuprofen as directed. Follow-up with Podiatry for ongoing symptoms.

## 2018-01-03 NOTE — ED Triage Notes (Signed)
FIRST NURSE NOTE-foot pain, no injury. Ambulatory. Not diabetic. No swelling. Pain to heel, worse in AM when gets out of bed. Describes plantar fascitis type symptoms to first nurse.

## 2018-03-07 ENCOUNTER — Encounter: Payer: Self-pay | Admitting: Emergency Medicine

## 2018-03-07 ENCOUNTER — Emergency Department
Admission: EM | Admit: 2018-03-07 | Discharge: 2018-03-07 | Disposition: A | Discharge status: Home / Self Care | Payer: Self-pay | Attending: Emergency Medicine | Admitting: Emergency Medicine

## 2018-03-07 ENCOUNTER — Emergency Department: Payer: Self-pay

## 2018-03-07 ENCOUNTER — Other Ambulatory Visit: Payer: Self-pay

## 2018-03-07 DIAGNOSIS — F17210 Nicotine dependence, cigarettes, uncomplicated: Secondary | ICD-10-CM | POA: Insufficient documentation

## 2018-03-07 DIAGNOSIS — I10 Essential (primary) hypertension: Secondary | ICD-10-CM | POA: Insufficient documentation

## 2018-03-07 DIAGNOSIS — Z79899 Other long term (current) drug therapy: Secondary | ICD-10-CM | POA: Insufficient documentation

## 2018-03-07 DIAGNOSIS — J4521 Mild intermittent asthma with (acute) exacerbation: Secondary | ICD-10-CM | POA: Insufficient documentation

## 2018-03-07 MED ORDER — METHYLPREDNISOLONE SODIUM SUCC 125 MG IJ SOLR
125.0000 mg | Freq: Once | INTRAMUSCULAR | Status: AC
  Administered 2018-03-07: 125 mg via INTRAMUSCULAR
  Filled 2018-03-07: qty 2

## 2018-03-07 MED ORDER — ALBUTEROL SULFATE HFA 108 (90 BASE) MCG/ACT IN AERS: 2.0000 | INHALATION_SPRAY | Freq: Four times a day (QID) | RESPIRATORY_TRACT | 2 refills | Status: DC | PRN

## 2018-03-07 MED ORDER — METHYLPREDNISOLONE 4 MG PO TBPK: ORAL_TABLET | ORAL | 0 refills | Status: DC

## 2018-03-07 MED ORDER — ALBUTEROL SULFATE (2.5 MG/3ML) 0.083% IN NEBU
5.0000 mg | INHALATION_SOLUTION | Freq: Once | RESPIRATORY_TRACT | Status: AC
  Administered 2018-03-07: 5 mg via RESPIRATORY_TRACT
  Filled 2018-03-07: qty 6

## 2018-03-07 MED ORDER — IPRATROPIUM-ALBUTEROL 0.5-2.5 (3) MG/3ML IN SOLN
RESPIRATORY_TRACT | Status: AC
  Filled 2018-03-07: qty 3

## 2018-03-07 MED ORDER — HYDROCOD POLST-CPM POLST ER 10-8 MG/5ML PO SUER: 5.0000 mL | Freq: Two times a day (BID) | ORAL | 0 refills | Status: DC

## 2018-03-07 MED ORDER — IPRATROPIUM-ALBUTEROL 0.5-2.5 (3) MG/3ML IN SOLN
3.0000 mL | Freq: Once | RESPIRATORY_TRACT | Status: AC
  Administered 2018-03-07: 3 mL via RESPIRATORY_TRACT
  Filled 2018-03-07: qty 3

## 2018-03-07 NOTE — ED Provider Notes (Signed)
[facility] Emergency Department Provider Note   ____________________________________________   First MD Initiated Contact with Patient 03/07/18 1433     (approximate)  I have reviewed the triage vital signs and the nursing notes.   HISTORY  Chief Complaint Asthma    HPI Justin Reid is a 38 y.o. male patient presents with cough and wheezing since yesterday.  Patient state he is a asthmatic but his inhaler is not controlling his cough and wheezing.  Patient denies other URI signs and symptoms.  Patient denies shortness of breath.   Denies chest pain.     Past Medical History:  Diagnosis Date  . Asthma   . Hypertension     Patient Active Problem List   Diagnosis Date Noted  . Sepsis (HCC) 12/24/2017  . Asthma 07/07/2016  . Asthma exacerbation 02/20/2016    History reviewed. No pertinent surgical history.  Prior to Admission medications   Medication Sig Start Date End Date Taking? Authorizing Provider  albuterol (PROVENTIL HFA;VENTOLIN HFA) 108 (90 Base) MCG/ACT inhaler Inhale 2 puffs into the lungs every 6 (six) hours as needed for wheezing or shortness of breath. 11/29/17   Fisher, Roselyn Bering, PA-C  albuterol (PROVENTIL HFA;VENTOLIN HFA) 108 (90 Base) MCG/ACT inhaler Inhale 2 puffs into the lungs every 6 (six) hours as needed for wheezing or shortness of breath. 03/07/18   Joni Reining, PA-C  chlorpheniramine-HYDROcodone (TUSSIONEX PENNKINETIC ER) 10-8 MG/5ML SUER Take 5 mLs by mouth 2 (two) times daily. 03/07/18   Joni Reining, PA-C  ibuprofen (ADVIL,MOTRIN) 800 MG tablet Take 1 tablet (800 mg total) by mouth every 8 (eight) hours as needed. 01/03/18   Menshew, Charlesetta Ivory, PA-C  methylPREDNISolone (MEDROL DOSEPAK) 4 MG TBPK tablet Take Tapered dose as directed 03/07/18   Joni Reining, PA-C  nicotine (NICODERM CQ - DOSED IN MG/24 HOURS) 14 mg/24hr patch Place 1 patch (14 mg total) onto the skin daily. 12/27/17   Salary, Evelena Asa, MD    omeprazole (PRILOSEC) 40 MG capsule Take 1 capsule (40 mg total) by mouth daily. Patient not taking: Reported on 12/24/2017 07/19/17 07/19/18  Myrna Blazer, MD  Spacer/Aero-Holding Chambers (AEROCHAMBER MV) inhaler Use as instructed 07/13/17   Willy Eddy, MD    Allergies Patient has no known allergies.  Family History  Problem Relation Age of Onset  . Hypertension Father     Social History Social History   Tobacco Use  . Smoking status: Current Every Day Smoker    Packs/day: 0.25    Years: 5.00    Pack years: 1.25    Types: Cigarettes  . Smokeless tobacco: Never Used  Substance Use Topics  . Alcohol use: Yes    Comment: occasionally  . Drug use: No    Review of Systems Constitutional: No fever/chills Eyes: No visual changes. ENT: No sore throat. Cardiovascular: Denies chest pain. Respiratory: Denies shortness of breath.  Wheezing and coughing. Gastrointestinal: No abdominal pain.  No nausea, no vomiting.  No diarrhea.  No constipation. Genitourinary: Negative for dysuria. Musculoskeletal: Negative for back pain. Skin: Negative for rash. Neurological: Negative for headaches, focal weakness or numbness. Endocrine:  Pretension ____________________________________________   PHYSICAL EXAM:  VITAL SIGNS: ED Triage Vitals  Enc Vitals Group     BP 03/07/18 1355 (!) 142/107     Pulse Rate 03/07/18 1355 92     Resp 03/07/18 1355 20     Temp 03/07/18 1355 98.4 F (36.9 C)  Temp Source 03/07/18 1355 Oral     SpO2 03/07/18 1355 94 %     Weight 03/07/18 1357 280 lb (127 kg)     Height 03/07/18 1357 5\' 11"  (1.803 m)     Head Circumference --      Peak Flow --      Pain Score 03/07/18 1356 9     Pain Loc --      Pain Edu? --      Excl. in GC? --    Constitutional: Alert and oriented. Well appearing and in no acute distress. Nose: Edematous nasal turbinates.   Mouth/Throat: Mucous membranes are moist.  Oropharynx non-erythematous. Neck: No  stridor.  Hematological/Lymphatic/Immunilogical: No cervical lymphadenopathy. Cardiovascular: Normal rate, regular rhythm. Grossly normal heart sounds.  Good peripheral circulation.  Elevated blood pressure Respiratory: Normal respiratory effort.  No retractions. Lungs bilateral expiratory wheezing.   Neurologic:  Normal speech and language. No gross focal neurologic deficits are appreciated. No gait instability. Skin:  Skin is warm, dry and intact. No rash noted. Psychiatric: Mood and affect are normal. Speech and behavior are normal.  ____________________________________________   LABS (all labs ordered are listed, but only abnormal results are displayed)  Labs Reviewed - No data to display ____________________________________________  EKG   ____________________________________________  RADIOLOGY  ED MD interpretation:   Official radiology report(s): Dg Chest 2 View  Result Date: 03/07/2018 CLINICAL DATA:  Shortness of breath and cough EXAM: CHEST - 2 VIEW COMPARISON:  02/24/2017 FINDINGS: Cardiac shadow is within normal limits. The lungs are clear bilaterally. No bony abnormality is noted. IMPRESSION: No acute abnormality noted. Electronically Signed   By: Alcide Clever M.D.   On: 03/07/2018 14:58    ____________________________________________   PROCEDURES  Procedure(s) performed (including Critical Care):  Procedures   ____________________________________________   INITIAL IMPRESSION / ASSESSMENT AND PLAN / ED COURSE  As part of my medical decision making, I reviewed the following data within the electronic MEDICAL RECORD NUMBER               ____________________________________________   FINAL CLINICAL IMPRESSION(S) / ED DIAGNOSES  Final diagnoses:  Mild intermittent asthma with exacerbation     ED Discharge Orders         Ordered    chlorpheniramine-HYDROcodone (TUSSIONEX PENNKINETIC ER) 10-8 MG/5ML SUER  2 times daily     03/07/18 1531     methylPREDNISolone (MEDROL DOSEPAK) 4 MG TBPK tablet     03/07/18 1531    albuterol (PROVENTIL HFA;VENTOLIN HFA) 108 (90 Base) MCG/ACT inhaler  Every 6 hours PRN     03/07/18 1531           Note:  This document was prepared using Dragon voice recognition software and may include unintentional dictation errors.    Joni Reining, PA-C 03/07/18 1531    Sharman Cheek, MD 03/10/18 [phone]

## 2018-03-07 NOTE — ED Triage Notes (Signed)
Pt in via POV, reports cough, wheezing since yesterday, using home inhalers without any relief.

## 2018-03-07 NOTE — ED Notes (Signed)
patient sating 98% on room air, reports treatment given in triage did not make him feel better. Auditory ins/exp wheezing noted with positive wet non productive cough.

## 2018-06-30 IMAGING — CR DG CHEST 2V
1 series · 2 of 2 positions shown · non-contrast
Comparison: 03/27/2008

CLINICAL DATA: Patient reports central chest pain onset yesterday.
Hx asthma. Current smoker.

EXAM:
CHEST  2 VIEW

[Series 1: w chest pa · 0.14mm/px · 2 of 2 slices shown]
[im 1/2]
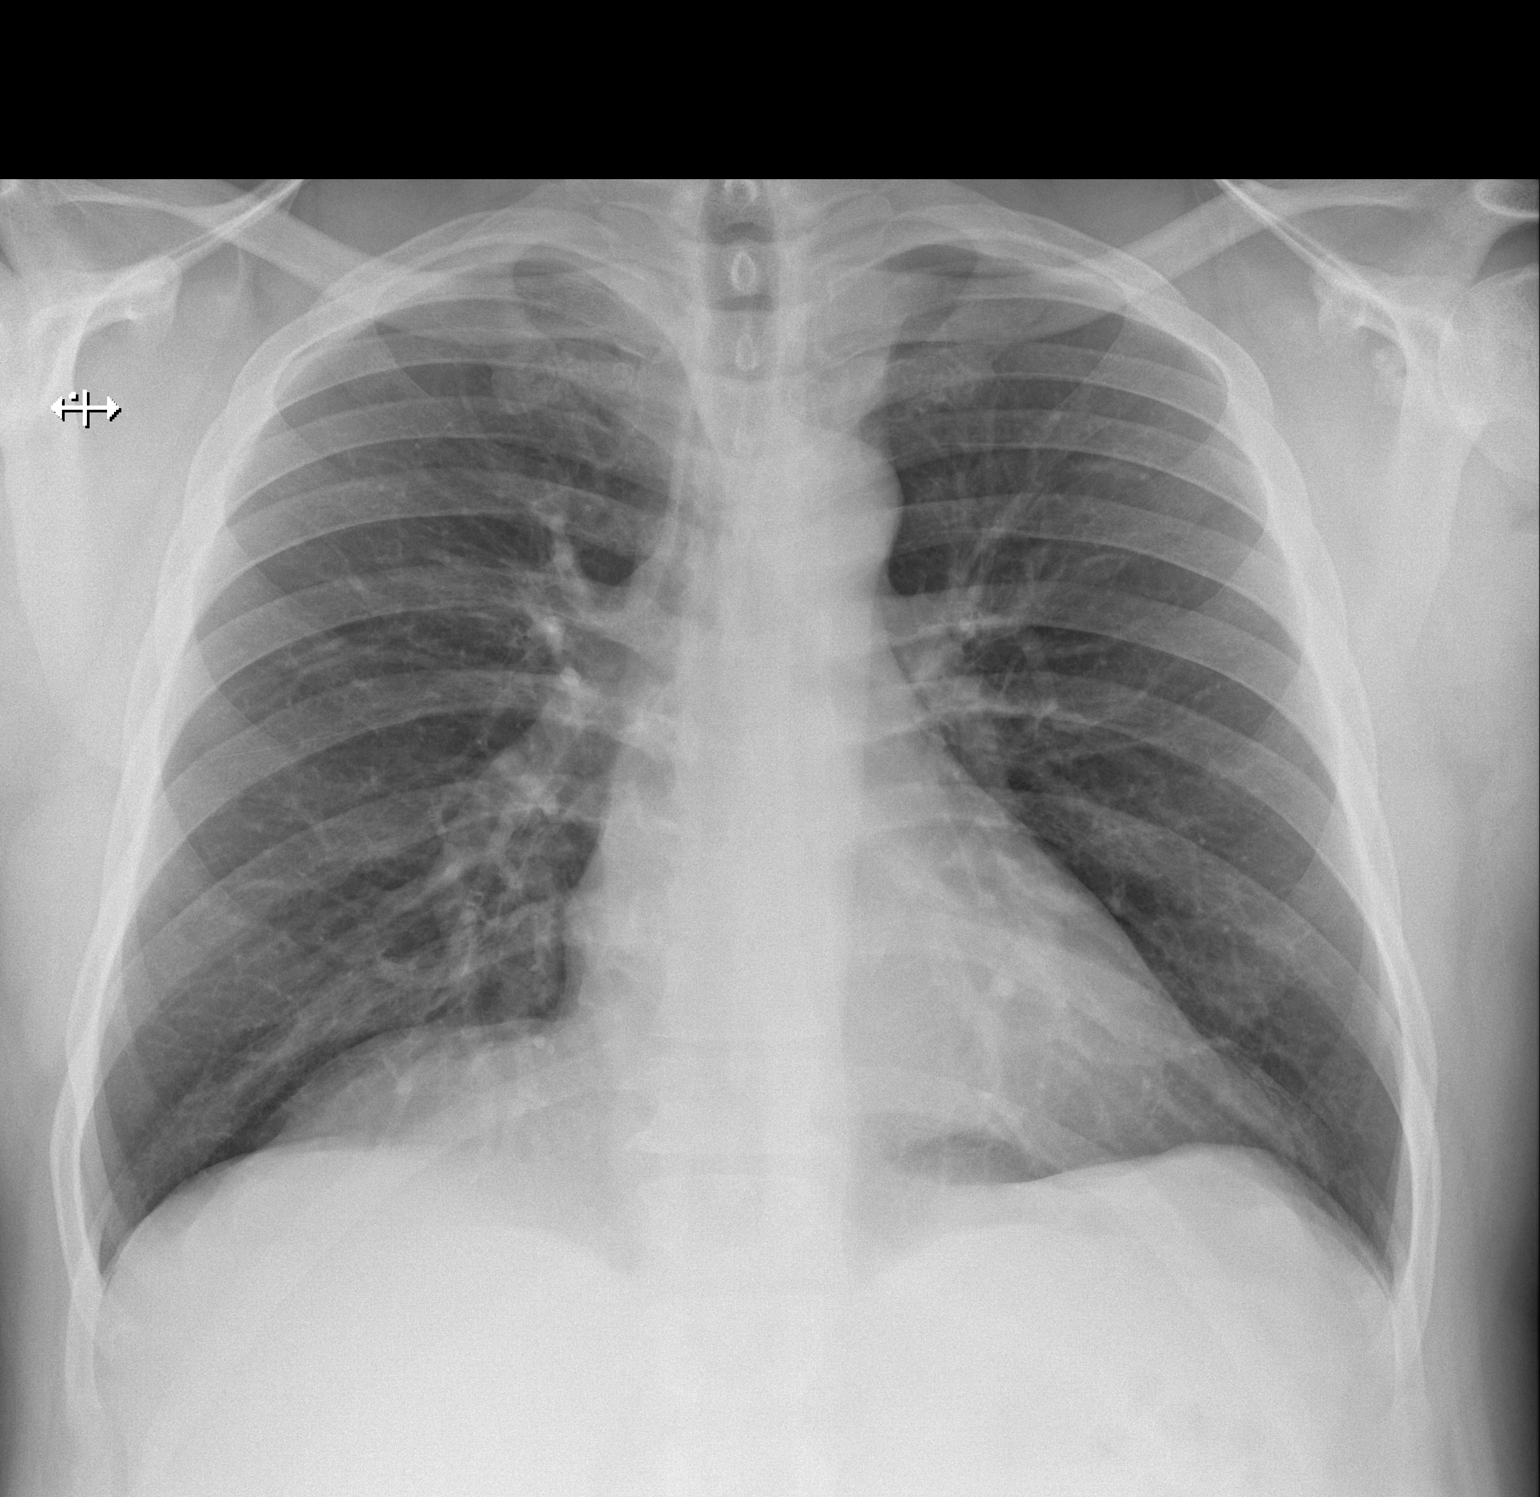
[im 2/2]
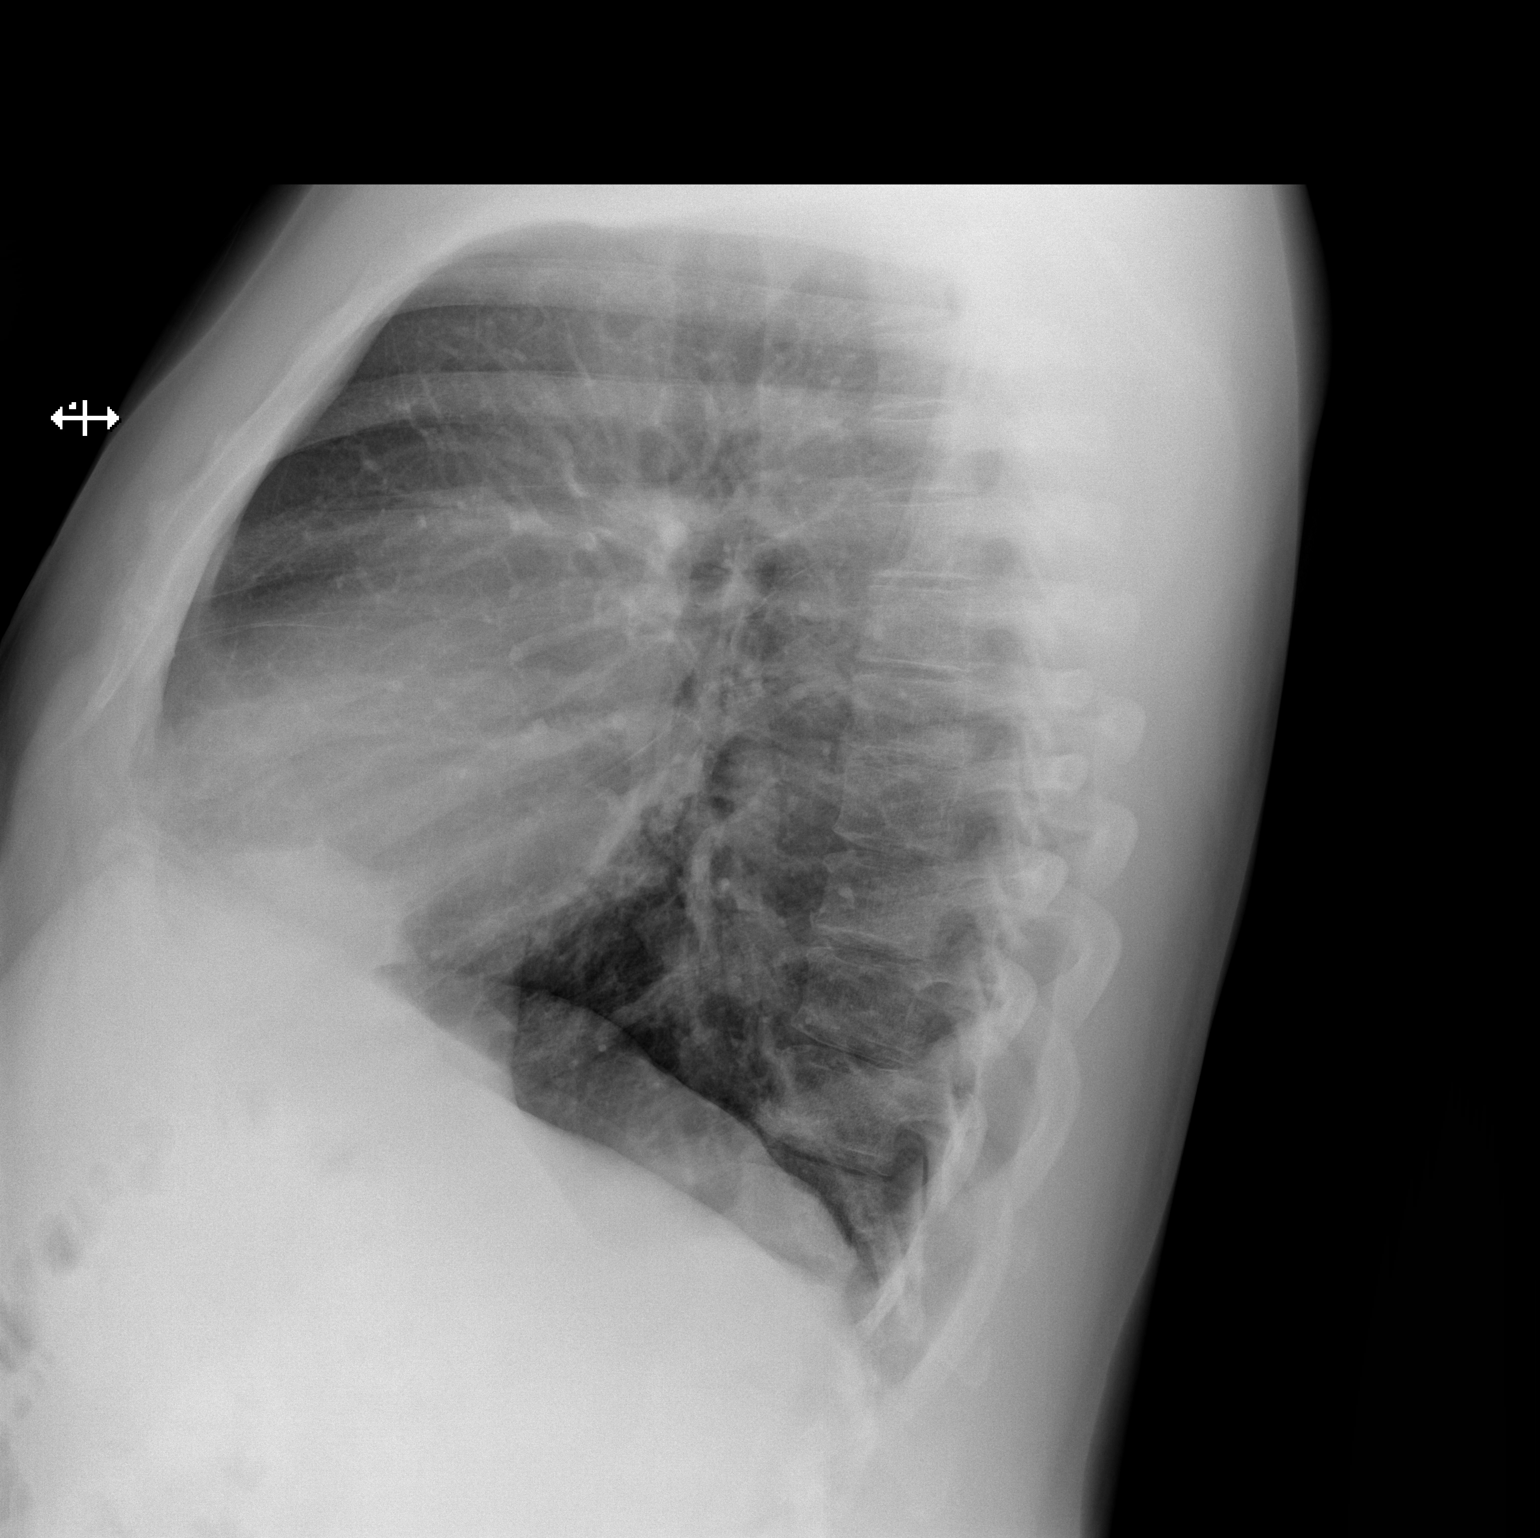

[2 of 2 positions shown; findings below may reference images not displayed]

FINDINGS: The heart size and mediastinal contours are within normal limits.
Both lungs are clear. No pleural effusion or pneumothorax. The
visualized skeletal structures are unremarkable.
IMPRESSION: No active cardiopulmonary disease.

## 2018-07-12 ENCOUNTER — Emergency Department
Admission: EM | Admit: 2018-07-12 | Discharge: 2018-07-12 | Disposition: A | Discharge status: Home / Self Care | Payer: Self-pay | Attending: Student in an Organized Health Care Education/Training Program | Admitting: Student in an Organized Health Care Education/Training Program

## 2018-07-12 ENCOUNTER — Other Ambulatory Visit: Payer: Self-pay

## 2018-07-12 ENCOUNTER — Encounter: Payer: Self-pay | Admitting: Emergency Medicine

## 2018-07-12 DIAGNOSIS — R1013 Epigastric pain: Secondary | ICD-10-CM | POA: Insufficient documentation

## 2018-07-12 DIAGNOSIS — F17210 Nicotine dependence, cigarettes, uncomplicated: Secondary | ICD-10-CM | POA: Insufficient documentation

## 2018-07-12 DIAGNOSIS — I10 Essential (primary) hypertension: Secondary | ICD-10-CM | POA: Insufficient documentation

## 2018-07-12 DIAGNOSIS — Z79899 Other long term (current) drug therapy: Secondary | ICD-10-CM | POA: Insufficient documentation

## 2018-07-12 DIAGNOSIS — J45909 Unspecified asthma, uncomplicated: Secondary | ICD-10-CM | POA: Insufficient documentation

## 2018-07-12 LAB — CBC
HCT: 40.8 % (ref 39.0–52.0)
Hemoglobin: 13.2 g/dL (ref 13.0–17.0)
MCH: 26.8 pg (ref 26.0–34.0)
MCHC: 32.4 g/dL (ref 30.0–36.0)
MCV: 82.9 fL (ref 80.0–100.0)
Platelets: 409 10*3/uL — ABNORMAL HIGH (ref 150–400)
RBC: 4.92 MIL/uL (ref 4.22–5.81)
RDW: 13.2 % (ref 11.5–15.5)
WBC: 6.4 10*3/uL (ref 4.0–10.5)
nRBC: 0 % (ref 0.0–0.2)

## 2018-07-12 LAB — COMPREHENSIVE METABOLIC PANEL
ALT: 30 U/L (ref 0–44)
AST: 26 U/L (ref 15–41)
Albumin: 4.2 g/dL (ref 3.5–5.0)
Alkaline Phosphatase: 55 U/L (ref 38–126)
Anion gap: 10 (ref 5–15)
BUN: 15 mg/dL (ref 6–20)
CO2: 25 mmol/L (ref 22–32)
Calcium: 8.7 mg/dL — ABNORMAL LOW (ref 8.9–10.3)
Chloride: 102 mmol/L (ref 98–111)
Creatinine, Ser: 0.8 mg/dL (ref 0.61–1.24)
GFR calc Af Amer: >60 mL/min (ref >60)
GFR calc non Af Amer: >60 mL/min (ref >60)
Glucose, Bld: 90 mg/dL (ref 70–99)
Potassium: 3.5 mmol/L (ref 3.5–5.1)
Sodium: 137 mmol/L (ref 135–145)
Total Bilirubin: 0.6 mg/dL (ref 0.3–1.2)
Total Protein: 7.7 g/dL (ref 6.5–8.1)

## 2018-07-12 LAB — TYPE AND SCREEN
ABO/RH(D): O POS
Antibody Screen: NEGATIVE

## 2018-07-12 MED ORDER — SODIUM CHLORIDE 0.9 % IV BOLUS
500.0000 mL | Freq: Once | INTRAVENOUS | Status: AC
  Administered 2018-07-12: 500 mL via INTRAVENOUS

## 2018-07-12 MED ORDER — ONDANSETRON HCL 4 MG PO TABS: 4.0000 mg | ORAL_TABLET | Freq: Every day | ORAL | 0 refills | Status: DC | PRN

## 2018-07-12 MED ORDER — ONDANSETRON HCL 4 MG/2ML IJ SOLN
4.0000 mg | Freq: Once | INTRAMUSCULAR | Status: AC
  Administered 2018-07-12: 4 mg via INTRAVENOUS
  Filled 2018-07-12: qty 2

## 2018-07-12 MED ORDER — PANTOPRAZOLE SODIUM 40 MG PO TBEC
40.0000 mg | DELAYED_RELEASE_TABLET | Freq: Once | ORAL | Status: AC
  Administered 2018-07-12: 40 mg via ORAL
  Filled 2018-07-12: qty 1

## 2018-07-12 MED ORDER — PANTOPRAZOLE SODIUM 20 MG PO TBEC: 20.0000 mg | DELAYED_RELEASE_TABLET | Freq: Every day | ORAL | 1 refills | Status: DC

## 2018-07-12 NOTE — ED Notes (Signed)
Pt alert and oriented X4, active, cooperative, pt in NAD. RR even and unlabored, color WNL.  Pt informed to return if any life threatening symptoms occur.  Discharge and followup instructions reviewed. Ambulates safely. 

## 2018-07-12 NOTE — ED Provider Notes (Signed)
[facility] Emergency Department Provider Note    First MD Initiated Contact with Patient 07/12/18 1333     (approximate)  I have reviewed the triage vital signs and the nursing notes.   HISTORY  Chief Complaint Hematemesis    HPI Kenn FileSteven D Mosey is a 38 y.o. male history of asthma as well as smoking history presents the ER for epigastric discomfort nausea vomiting and 2 episodes of emesis this morning did have blood-tinged emesis towards the end.  Denies any melena or hematochezia.  He has had similar episodes in the past.  Did drink alcohol last night.  Likes spicy foods.  Has not been on any recent steroids.  Feels that his asthma and shortness of breath is at baseline.  Denies any fevers.  No flank pain.  No dysuria.  Not on any blood thinners.    Past Medical History:  Diagnosis Date  . Asthma   . Hypertension    Family History  Problem Relation Age of Onset  . Hypertension Father    History reviewed. No pertinent surgical history. Patient Active Problem List   Diagnosis Date Noted  . Sepsis (HCC) 12/24/2017  . Asthma 07/07/2016  . Asthma exacerbation 02/20/2016      Prior to Admission medications   Medication Sig Start Date End Date Taking? Authorizing Provider  albuterol (PROVENTIL HFA;VENTOLIN HFA) 108 (90 Base) MCG/ACT inhaler Inhale 2 puffs into the lungs every 6 (six) hours as needed for wheezing or shortness of breath. 11/29/17   Fisher, Roselyn BeringSusan W, PA-C  albuterol (PROVENTIL HFA;VENTOLIN HFA) 108 (90 Base) MCG/ACT inhaler Inhale 2 puffs into the lungs every 6 (six) hours as needed for wheezing or shortness of breath. 03/07/18   Joni ReiningSmith, Ronald K, PA-C  chlorpheniramine-HYDROcodone (TUSSIONEX PENNKINETIC ER) 10-8 MG/5ML SUER Take 5 mLs by mouth 2 (two) times daily. 03/07/18   Joni ReiningSmith, Ronald K, PA-C  ibuprofen (ADVIL,MOTRIN) 800 MG tablet Take 1 tablet (800 mg total) by mouth every 8 (eight) hours as needed. 01/03/18   Menshew, Charlesetta IvoryJenise V Bacon,  PA-C  methylPREDNISolone (MEDROL DOSEPAK) 4 MG TBPK tablet Take Tapered dose as directed 03/07/18   Joni ReiningSmith, Ronald K, PA-C  nicotine (NICODERM CQ - DOSED IN MG/24 HOURS) 14 mg/24hr patch Place 1 patch (14 mg total) onto the skin daily. 12/27/17   Salary, Evelena AsaMontell D, MD  omeprazole (PRILOSEC) 40 MG capsule Take 1 capsule (40 mg total) by mouth daily. Patient not taking: Reported on 12/24/2017 07/19/17 07/19/18  Myrna BlazerSchaevitz, David Matthew, MD  ondansetron (ZOFRAN) 4 MG tablet Take 1 tablet (4 mg total) by mouth daily as needed. 07/12/18 07/12/19  Willy Eddyobinson, Jacorian Golaszewski, MD  pantoprazole (PROTONIX) 20 MG tablet Take 1 tablet (20 mg total) by mouth daily. 07/12/18 07/12/19  Willy Eddyobinson, Luna Audia, MD  Spacer/Aero-Holding Chambers (AEROCHAMBER MV) inhaler Use as instructed 07/13/17   Willy Eddyobinson, Lalana Wachter, MD    Allergies Patient has no known allergies.    Social History Social History   Tobacco Use  . Smoking status: Current Every Day Smoker    Packs/day: 0.25    Years: 5.00    Pack years: 1.25    Types: Cigarettes  . Smokeless tobacco: Never Used  Substance Use Topics  . Alcohol use: Yes    Comment: occasionally  . Drug use: No    Review of Systems Patient denies headaches, rhinorrhea, blurry vision, numbness, shortness of breath, chest pain, edema, cough, abdominal pain, nausea, vomiting, diarrhea, dysuria, fevers, rashes or hallucinations unless otherwise stated above in HPI.  ____________________________________________   PHYSICAL EXAM:  VITAL SIGNS: Vitals:   07/12/18 1152 07/12/18 1500  BP: 126/86 (!) 148/98  Pulse: 78 77  Resp: 16 18  Temp: 98.2 F (36.8 C) 98.1 F (36.7 C)  SpO2: 95% 98%    Constitutional: Alert and oriented.  Eyes: Conjunctivae are normal.  Head: Atraumatic. Nose: No congestion/rhinnorhea. Mouth/Throat: Mucous membranes are moist.   Neck: No stridor. Painless ROM.  Cardiovascular: Normal rate, regular rhythm. Grossly normal heart sounds.  Good peripheral circulation.  Respiratory: Normal respiratory effort.  No retractions. Lungs CTAB. Gastrointestinal: Soft and nontender. No distention. No abdominal bruits. No CVA tenderness. Genitourinary:  Musculoskeletal: No lower extremity tenderness nor edema.  No joint effusions. Neurologic:  Normal speech and language. No gross focal neurologic deficits are appreciated. No facial droop Skin:  Skin is warm, dry and intact. No rash noted. Psychiatric: Mood and affect are normal. Speech and behavior are normal.  ____________________________________________   LABS (all labs ordered are listed, but only abnormal results are displayed)  Results for orders placed or performed during the hospital encounter of 07/12/18 (from the past 24 hour(s))  CBC     Status: Abnormal   Collection Time: 07/12/18 11:52 AM  Result Value Ref Range   WBC 6.4 4.0 - 10.5 K/uL   RBC 4.92 4.22 - 5.81 MIL/uL   Hemoglobin 13.2 13.0 - 17.0 g/dL   HCT 40.8 39.0 - 52.0 %   MCV 82.9 80.0 - 100.0 fL   MCH 26.8 26.0 - 34.0 pg   MCHC 32.4 30.0 - 36.0 g/dL   RDW 13.2 11.5 - 15.5 %   Platelets 409 (H) 150 - 400 K/uL   nRBC 0.0 0.0 - 0.2 %  Comprehensive metabolic panel     Status: Abnormal   Collection Time: 07/12/18 12:04 PM  Result Value Ref Range   Sodium 137 135 - 145 mmol/L   Potassium 3.5 3.5 - 5.1 mmol/L   Chloride 102 98 - 111 mmol/L   CO2 25 22 - 32 mmol/L   Glucose, Bld 90 70 - 99 mg/dL   BUN 15 6 - 20 mg/dL   Creatinine, Ser 0.80 0.61 - 1.24 mg/dL   Calcium 8.7 (L) 8.9 - 10.3 mg/dL   Total Protein 7.7 6.5 - 8.1 g/dL   Albumin 4.2 3.5 - 5.0 g/dL   AST 26 15 - 41 U/L   ALT 30 0 - 44 U/L   Alkaline Phosphatase 55 38 - 126 U/L   Total Bilirubin 0.6 0.3 - 1.2 mg/dL   GFR calc non Af Amer >60 >60 mL/min   GFR calc Af Amer >60 >60 mL/min   Anion gap 10 5 - 15  Type and screen [place]     Status: None   Collection Time: 07/12/18 12:04 PM  Result Value Ref Range   ABO/RH(D) O POS    Antibody Screen NEG     Sample Expiration      07/15/2018,2359 Performed at [facility], [address] [place], [place] [address]    ____________________________________________ _________________________________  RADIOLOGY   ____________________________________________   PROCEDURES  Procedure(s) performed:  Procedures    Critical Care performed: no ____________________________________________   INITIAL IMPRESSION / ASSESSMENT AND PLAN / ED COURSE  Pertinent labs & imaging results that were available during my care of the patient were reviewed by me and considered in my medical decision making (see chart for details).   DDX: Gastritis, esophagitis, PUD, enteritis, boerhaaves  Mohamad Bruso  Aundria RudRogers is a 38 y.o. who presents to the ED with symptoms as described above.  He is afebrile and hemodynamically stable.  Protecting his airway.  Abdominal exam is soft and benign.  Blood work is very reassuring.  Not having any persistent nausea vomiting or hematemesis.  Suspect Mallory-Weiss tears.  Probable gastritis.  Patient given IV fluids as he does still feel nauseated and dehydrated given IV antiemetic with improvement in symptoms.  He tolerated Protonix which I will give him prescription for and referral to GI.     The patient was evaluated in Emergency Department today for the symptoms described in the history of present illness. He/she was evaluated in the context of the global COVID-19 pandemic, which necessitated consideration that the patient might be at risk for infection with the SARS-CoV-2 virus that causes COVID-19. Institutional protocols and algorithms that pertain to the evaluation of patients at risk for COVID-19 are in a state of rapid change based on information released by regulatory bodies including the CDC and federal and state organizations. These policies and algorithms were followed during the patient's care in the ED.  As part of my medical decision making, I reviewed the  following data within the electronic MEDICAL RECORD NUMBER Nursing notes reviewed and incorporated, Labs reviewed, notes from prior ED visits and [place] Controlled Substance Database   ____________________________________________   FINAL CLINICAL IMPRESSION(S) / ED DIAGNOSES  Final diagnoses:  Acute epigastric pain      NEW MEDICATIONS STARTED DURING THIS VISIT:  New Prescriptions   ONDANSETRON (ZOFRAN) 4 MG TABLET    Take 1 tablet (4 mg total) by mouth daily as needed.   PANTOPRAZOLE (PROTONIX) 20 MG TABLET    Take 1 tablet (20 mg total) by mouth daily.     Note:  This document was prepared using Dragon voice recognition software and may include unintentional dictation errors.    Willy Eddyobinson, Katelan Hirt, MD 07/12/18 [id number]

## 2018-07-12 NOTE — ED Triage Notes (Signed)
C/O abdominal pain and vomiting blood today.  States he has similar symptoms in the past, can't recall cause.

## 2018-08-17 ENCOUNTER — Other Ambulatory Visit: Payer: Self-pay

## 2018-08-17 ENCOUNTER — Encounter: Payer: Self-pay | Admitting: Emergency Medicine

## 2018-08-17 ENCOUNTER — Emergency Department
Admission: EM | Admit: 2018-08-17 | Discharge: 2018-08-17 | Disposition: A | Discharge status: Home / Self Care | Payer: Self-pay | Attending: Student in an Organized Health Care Education/Training Program | Admitting: Student in an Organized Health Care Education/Training Program

## 2018-08-17 DIAGNOSIS — F17210 Nicotine dependence, cigarettes, uncomplicated: Secondary | ICD-10-CM | POA: Insufficient documentation

## 2018-08-17 DIAGNOSIS — R2241 Localized swelling, mass and lump, right lower limb: Secondary | ICD-10-CM | POA: Insufficient documentation

## 2018-08-17 DIAGNOSIS — I10 Essential (primary) hypertension: Secondary | ICD-10-CM | POA: Insufficient documentation

## 2018-08-17 DIAGNOSIS — M25561 Pain in right knee: Secondary | ICD-10-CM | POA: Insufficient documentation

## 2018-08-17 DIAGNOSIS — J45909 Unspecified asthma, uncomplicated: Secondary | ICD-10-CM | POA: Insufficient documentation

## 2018-08-17 MED ORDER — MELOXICAM 15 MG PO TABS: 15.0000 mg | ORAL_TABLET | Freq: Every day | ORAL | 0 refills | Status: DC

## 2018-08-17 MED ORDER — TRAMADOL HCL 50 MG PO TABS: 50.0000 mg | ORAL_TABLET | Freq: Four times a day (QID) | ORAL | 0 refills | Status: DC | PRN

## 2018-08-17 NOTE — ED Notes (Signed)
See triage note  Presents with right knee pain for couple of days  Denies any injury  States he woke up like this  Min swelling noted and ambulates with slight limp

## 2018-08-17 NOTE — ED Triage Notes (Signed)
Right knee pain

## 2018-08-17 NOTE — ED Provider Notes (Signed)
[facility] Emergency Department Provider Note ____________________________________________  Time seen: Approximately 11:04 AM  I have reviewed the triage vital signs and the nursing notes.   HISTORY  Chief Complaint Knee Pain    HPI Justin Reid is a 38 y.o. male who presents to the emergency department for evaluation and treatment of right knee pain.  He states that he awakened this morning and noticed that it was swollen.  He has pain when he tries to walk.  He has had this in the past and was treated with a knee immobilizer and ibuprofen.  He has not taken any thing for pain prior to arrival.   Past Medical History:  Diagnosis Date  . Asthma   . Hypertension     Patient Active Problem List   Diagnosis Date Noted  . Sepsis (HCC) 12/24/2017  . Asthma 07/07/2016  . Asthma exacerbation 02/20/2016    History reviewed. No pertinent surgical history.  Prior to Admission medications   Medication Sig Start Date End Date Taking? Authorizing Provider  meloxicam (MOBIC) 15 MG tablet Take 1 tablet (15 mg total) by mouth daily. 08/17/18   Anea Fodera, Rulon Eisenmengerari B, FNP  traMADol (ULTRAM) 50 MG tablet Take 1 tablet (50 mg total) by mouth every 6 (six) hours as needed. 08/17/18   Chinita Pesterriplett, Danyale Ridinger B, FNP    Allergies Patient has no known allergies.  Family History  Problem Relation Age of Onset  . Hypertension Father     Social History Social History   Tobacco Use  . Smoking status: Current Every Day Smoker    Packs/day: 0.25    Years: 5.00    Pack years: 1.25    Types: Cigarettes  . Smokeless tobacco: Never Used  Substance Use Topics  . Alcohol use: Yes    Comment: occasionally  . Drug use: No    Review of Systems Constitutional: Negative for fever. Cardiovascular: Negative for chest pain. Respiratory: Negative for shortness of breath. Musculoskeletal: Positive for right knee pain. Skin: Negative for open wound or lesion on exposed  skin Neurological: Negative for decrease in sensation  ____________________________________________   PHYSICAL EXAM:  VITAL SIGNS: ED Triage Vitals  Enc Vitals Group     BP 08/17/18 0920 (!) 148/93     Pulse Rate 08/17/18 0920 71     Resp 08/17/18 0920 18     Temp 08/17/18 0920 98.6 F (37 C)     Temp Source 08/17/18 0920 Oral     SpO2 08/17/18 0920 98 %     Weight --      Height --      Head Circumference --      Peak Flow --      Pain Score 08/17/18 0922 0     Pain Loc --      Pain Edu? --      Excl. in GC? --     Constitutional: Alert and oriented. Well appearing and in no acute distress. Eyes: Conjunctivae are clear without discharge or drainage Head: Atraumatic Neck: Supple Respiratory: No cough. Respirations are even and unlabored. Musculoskeletal: Full range of motion of the right knee observed. Small knee effusion.  Neurologic: Awake, alert, and oriented.  Skin: Intact.  Psychiatric: Affect and behavior are appropriate.  ____________________________________________   LABS (all labs ordered are listed, but only abnormal results are displayed)  Labs Reviewed - No data to display ____________________________________________  RADIOLOGY  Not indicated. ____________________________________________   PROCEDURES  Procedures  ____________________________________________   INITIAL  IMPRESSION / ASSESSMENT AND PLAN / ED COURSE  Justin Reid is a 38 y.o. who presents to the emergency department for treatment and evaluation of nontraumatic knee pain.  He was advised that it would be best to purchase a knee sleeve and wear it during the day while he is at work.  Prescriptions for meloxicam and tramadol were given.  Patient instructed to follow-up with orthopedics.  He was also instructed to return to the emergency department for symptoms that change or worsen if unable schedule an appointment with orthopedics or primary care.  Medications - No data to  display  Pertinent labs & imaging results that were available during my care of the patient were reviewed by me and considered in my medical decision making (see chart for details).  _________________________________________   FINAL CLINICAL IMPRESSION(S) / ED DIAGNOSES  Final diagnoses:  Acute pain of right knee    ED Discharge Orders         Ordered    meloxicam (MOBIC) 15 MG tablet  Daily     08/17/18 0940    traMADol (ULTRAM) 50 MG tablet  Every 6 hours PRN     08/17/18 0940           If controlled substance prescribed during this visit, 12 month history viewed on the [place] prior to issuing an initial prescription for Schedule II or III opiod.   Victorino Dike, FNP 08/17/18 1337    Merlyn Lot, MD 08/17/18 1428

## 2018-08-17 NOTE — Discharge Instructions (Signed)
Please follow up with orthopedics. Call to schedule an appointment.  As we discussed, purchase a knee brace at the pharmacy and wear it when out of bed.  Ice and elevate your leg often.

## 2018-10-16 ENCOUNTER — Emergency Department
Admission: EM | Admit: 2018-10-16 | Discharge: 2018-10-16 | Disposition: A | Discharge status: Home / Self Care | Payer: Self-pay | Attending: Student | Admitting: Student

## 2018-10-16 ENCOUNTER — Other Ambulatory Visit: Payer: Self-pay

## 2018-10-16 ENCOUNTER — Encounter: Payer: Self-pay | Admitting: Emergency Medicine

## 2018-10-16 DIAGNOSIS — J4521 Mild intermittent asthma with (acute) exacerbation: Secondary | ICD-10-CM | POA: Insufficient documentation

## 2018-10-16 DIAGNOSIS — I10 Essential (primary) hypertension: Secondary | ICD-10-CM | POA: Insufficient documentation

## 2018-10-16 DIAGNOSIS — F17210 Nicotine dependence, cigarettes, uncomplicated: Secondary | ICD-10-CM | POA: Insufficient documentation

## 2018-10-16 MED ORDER — DEXAMETHASONE SODIUM PHOSPHATE 10 MG/ML IJ SOLN
10.0000 mg | Freq: Once | INTRAMUSCULAR | Status: AC
  Administered 2018-10-16: 10 mg via INTRAMUSCULAR
  Filled 2018-10-16: qty 1

## 2018-10-16 MED ORDER — IPRATROPIUM-ALBUTEROL 0.5-2.5 (3) MG/3ML IN SOLN
3.0000 mL | Freq: Once | RESPIRATORY_TRACT | Status: DC
  Filled 2018-10-16: qty 3

## 2018-10-16 MED ORDER — ALBUTEROL SULFATE (2.5 MG/3ML) 0.083% IN NEBU
5.0000 mg | INHALATION_SOLUTION | Freq: Once | RESPIRATORY_TRACT | Status: AC
  Administered 2018-10-16: 11:00:00 5 mg via RESPIRATORY_TRACT
  Filled 2018-10-16: qty 6

## 2018-10-16 MED ORDER — PREDNISONE 10 MG (21) PO TBPK: ORAL_TABLET | ORAL | 0 refills | Status: DC

## 2018-10-16 MED ORDER — ALBUTEROL SULFATE HFA 108 (90 BASE) MCG/ACT IN AERS: 2.0000 | INHALATION_SPRAY | Freq: Four times a day (QID) | RESPIRATORY_TRACT | 11 refills | Status: DC | PRN

## 2018-10-16 NOTE — ED Notes (Signed)
See triage note   Presents with asthma attack  states he is out of inhalers

## 2018-10-16 NOTE — ED Triage Notes (Signed)
Pt reports his asthma is flaring up and he is about out of his inhaler. Pt denies fevers, congestion or productive cough. Pt reports may need breathing treatment and a new inhaler

## 2018-10-16 NOTE — Discharge Instructions (Signed)
Follow-up with your regular doctor if not better in 2 to 3 days.  Return emergency department worsening.  Take your medication as prescribed.

## 2018-10-16 NOTE — ED Provider Notes (Signed)
[facility] Emergency Department Provider Note  ____________________________________________   First MD Initiated Contact with Patient 10/16/18 1105     (approximate)  I have reviewed the triage vital signs and the nursing notes.   HISTORY  Chief Complaint Asthma    HPI Justin Reid is a 38 y.o. male presents emergency department complaining of asthma exasperation.  States he is out of his inhaler.  He denies any fever or chills.  No chest pain.  States no COVID contacts or COVID symptoms.  He states it is just my asthma and not needing the inhaler.    Past Medical History:  Diagnosis Date  . Asthma   . Hypertension     Patient Active Problem List   Diagnosis Date Noted  . Sepsis ([place]) 12/24/2017  . Asthma 07/07/2016  . Asthma exacerbation 02/20/2016    History reviewed. No pertinent surgical history.  Prior to Admission medications   Medication Sig Start Date End Date Taking? Authorizing Provider  albuterol (VENTOLIN HFA) 108 (90 Base) MCG/ACT inhaler Inhale 2 puffs into the lungs every 6 (six) hours as needed for wheezing or shortness of breath. 10/16/18   , Linden Dolin, PA-C  predniSONE (STERAPRED UNI-PAK 21 TAB) 10 MG (21) TBPK tablet Take 6 pills on day one then decrease by 1 pill each day 10/16/18   Versie Starks, PA-C    Allergies Patient has no known allergies.  Family History  Problem Relation Age of Onset  . Hypertension Father     Social History Social History   Tobacco Use  . Smoking status: Current Every Day Smoker    Packs/day: 0.25    Years: 5.00    Pack years: 1.25    Types: Cigarettes  . Smokeless tobacco: Never Used  Substance Use Topics  . Alcohol use: Yes    Comment: occasionally  . Drug use: No    Review of Systems  Constitutional: No fever/chills Eyes: No visual changes. ENT: No sore throat. Respiratory: Denies cough, positive for shortness of breath secondary to asthma exasperation  Genitourinary: Negative for dysuria. Musculoskeletal: Negative for back pain. Skin: Negative for rash.    ____________________________________________   PHYSICAL EXAM:  VITAL SIGNS: ED Triage Vitals  Enc Vitals Group     BP 10/16/18 1051 (!) 134/101     Pulse Rate 10/16/18 1051 84     Resp 10/16/18 1051 20     Temp 10/16/18 1051 98.7 F (37.1 C)     Temp Source 10/16/18 1051 Oral     SpO2 10/16/18 1051 98 %     Weight 10/16/18 1053 280 lb (127 kg)     Height 10/16/18 1053 5\' 11"  (1.803 m)     Head Circumference --      Peak Flow --      Pain Score 10/16/18 1056 0     Pain Loc --      Pain Edu? --      Excl. in [place]? --     Constitutional: Alert and oriented. Well appearing and in no acute distress. Eyes: Conjunctivae are normal.  Head: Atraumatic. Nose: No congestion/rhinnorhea. Mouth/Throat: Mucous membranes are moist.   Neck:  supple no lymphadenopathy noted Cardiovascular: Normal rate, regular rhythm. Heart sounds are normal Respiratory: Normal respiratory effort.  No retractions, lungs with very little lung sounds. Abd: soft nontender bs normal all 4 quad GU: deferred Musculoskeletal: FROM all extremities, warm and well perfused Neurologic:  Normal speech and language.  Skin:  Skin is warm, dry and intact. No rash noted. Psychiatric: Mood and affect are normal. Speech and behavior are normal.  ____________________________________________   LABS (all labs ordered are listed, but only abnormal results are displayed)  Labs Reviewed - No data to display ____________________________________________   ____________________________________________  RADIOLOGY    ____________________________________________   PROCEDURES  Procedure(s) performed: Albuterol nebulizer treatment, Decadron 10 mg IM   Procedures    ____________________________________________   INITIAL IMPRESSION / ASSESSMENT AND PLAN / ED COURSE  Pertinent labs & imaging results that  were available during my care of the patient were reviewed by me and considered in my medical decision making (see chart for details).   Patient is 38 year old male presents emergency department complaint of asthma exasperation.  Physical exam shows patient to appear nontoxic.  Blood pressure is a little elevated but remainder the vitals are normal.  Lungs with decreased breath sounds throughout all lung fields.  Albuterol nebulizer treatment, Decadron 10 mg IM.  On recheck patient has increased breath sounds throughout the lung fields.  However I feel he would benefit from an additional breathing treatment.  He is refusing an additional breathing treatment at this time.  He would just like a prescription for his inhalers and steroids. He was discharged in stable condition with prescription for albuterol and Sterapred.  He is to follow-up with his regular doctor if not improving in 3 to 4 days.  Return emergency department worsening.  States he understands will comply.    Justin Reid was evaluated in Emergency Department on 10/16/2018 for the symptoms described in the history of present illness. He was evaluated in the context of the global COVID-19 pandemic, which necessitated consideration that the patient might be at risk for infection with the SARS-CoV-2 virus that causes COVID-19. Institutional protocols and algorithms that pertain to the evaluation of patients at risk for COVID-19 are in a state of rapid change based on information released by regulatory bodies including the CDC and federal and state organizations. These policies and algorithms were followed during the patient's care in the ED.   As part of my medical decision making, I reviewed the following data within the electronic MEDICAL RECORD NUMBER Nursing notes reviewed and incorporated, Old chart reviewed, Notes from prior ED visits and [place] Controlled Substance Database  ____________________________________________   FINAL CLINICAL  IMPRESSION(S) / ED DIAGNOSES  Final diagnoses:  Mild intermittent asthma with exacerbation      NEW MEDICATIONS STARTED DURING THIS VISIT:  New Prescriptions   ALBUTEROL (VENTOLIN HFA) 108 (90 BASE) MCG/ACT INHALER    Inhale 2 puffs into the lungs every 6 (six) hours as needed for wheezing or shortness of breath.   PREDNISONE (STERAPRED UNI-PAK 21 TAB) 10 MG (21) TBPK TABLET    Take 6 pills on day one then decrease by 1 pill each day     Note:  This document was prepared using Dragon voice recognition software and may include unintentional dictation errors.    Faythe Ghee, PA-C 10/16/18 1205    Miguel Aschoff., MD 10/16/18 [phone]

## 2018-10-20 ENCOUNTER — Emergency Department
Admission: EM | Admit: 2018-10-20 | Discharge: 2018-10-20 | Disposition: A | Discharge status: Home / Self Care | Payer: Self-pay | Attending: Emergency Medicine | Admitting: Emergency Medicine

## 2018-10-20 ENCOUNTER — Encounter: Payer: Self-pay | Admitting: *Deleted

## 2018-10-20 ENCOUNTER — Emergency Department: Payer: Self-pay

## 2018-10-20 ENCOUNTER — Other Ambulatory Visit: Payer: Self-pay

## 2018-10-20 DIAGNOSIS — I10 Essential (primary) hypertension: Secondary | ICD-10-CM | POA: Insufficient documentation

## 2018-10-20 DIAGNOSIS — K529 Noninfective gastroenteritis and colitis, unspecified: Secondary | ICD-10-CM | POA: Insufficient documentation

## 2018-10-20 DIAGNOSIS — F17210 Nicotine dependence, cigarettes, uncomplicated: Secondary | ICD-10-CM | POA: Insufficient documentation

## 2018-10-20 DIAGNOSIS — J45909 Unspecified asthma, uncomplicated: Secondary | ICD-10-CM | POA: Insufficient documentation

## 2018-10-20 LAB — COMPREHENSIVE METABOLIC PANEL
ALT: 25 U/L (ref 0–44)
AST: 20 U/L (ref 15–41)
Albumin: 4.2 g/dL (ref 3.5–5.0)
Alkaline Phosphatase: 53 U/L (ref 38–126)
Anion gap: 10 (ref 5–15)
BUN: 15 mg/dL (ref 6–20)
CO2: 29 mmol/L (ref 22–32)
Calcium: 9.2 mg/dL (ref 8.9–10.3)
Chloride: 100 mmol/L (ref 98–111)
Creatinine, Ser: 0.79 mg/dL (ref 0.61–1.24)
GFR calc Af Amer: >60 mL/min (ref >60)
GFR calc non Af Amer: >60 mL/min (ref >60)
Glucose, Bld: 89 mg/dL (ref 70–99)
Potassium: 3.7 mmol/L (ref 3.5–5.1)
Sodium: 139 mmol/L (ref 135–145)
Total Bilirubin: 0.7 mg/dL (ref 0.3–1.2)
Total Protein: 7.8 g/dL (ref 6.5–8.1)

## 2018-10-20 LAB — CBC
HCT: 38.4 % — ABNORMAL LOW (ref 39.0–52.0)
Hemoglobin: 12.5 g/dL — ABNORMAL LOW (ref 13.0–17.0)
MCH: 26.7 pg (ref 26.0–34.0)
MCHC: 32.6 g/dL (ref 30.0–36.0)
MCV: 82.1 fL (ref 80.0–100.0)
Platelets: 405 10*3/uL — ABNORMAL HIGH (ref 150–400)
RBC: 4.68 MIL/uL (ref 4.22–5.81)
RDW: 13.9 % (ref 11.5–15.5)
WBC: 9.5 10*3/uL (ref 4.0–10.5)
nRBC: 0 % (ref 0.0–0.2)

## 2018-10-20 LAB — LIPASE, BLOOD: Lipase: 79 U/L — ABNORMAL HIGH (ref 11–51)

## 2018-10-20 MED ORDER — KETOROLAC TROMETHAMINE 30 MG/ML IJ SOLN
30.0000 mg | Freq: Once | INTRAMUSCULAR | Status: AC
  Administered 2018-10-20: 30 mg via INTRAMUSCULAR
  Filled 2018-10-20: qty 1

## 2018-10-20 MED ORDER — ONDANSETRON 4 MG PO TBDP
4.0000 mg | ORAL_TABLET | Freq: Once | ORAL | Status: AC
  Administered 2018-10-20: 15:00:00 4 mg via ORAL
  Filled 2018-10-20: qty 1

## 2018-10-20 MED ORDER — ONDANSETRON 4 MG PO TBDP
4.0000 mg | ORAL_TABLET | Freq: Once | ORAL | Status: AC | PRN
  Administered 2018-10-20: 12:00:00 4 mg via ORAL
  Filled 2018-10-20: qty 1

## 2018-10-20 NOTE — ED Provider Notes (Signed)
[facility] Emergency Department Provider Note   ____________________________________________    I have reviewed the triage vital signs and the nursing notes.   HISTORY  Chief Complaint Emesis and Abdominal Pain     HPI Justin Reid is a 38 y.o. male who presents with complaints of abdominal cramping, diarrhea and vomiting.  He denies myalgias.  No fevers or chills.  No loss of smell or taste.  Report symptoms started yesterday.  Has not take anything for this.  No history of abdominal surgery.  Describes some pain in his lower abdomen which seems to be primarily in the right lower quadrant.  No hematuria.  No radiation of pain  Past Medical History:  Diagnosis Date  . Asthma   . Hypertension     Patient Active Problem List   Diagnosis Date Noted  . Sepsis ([facility]) 12/24/2017  . Asthma 07/07/2016  . Asthma exacerbation 02/20/2016    History reviewed. No pertinent surgical history.  Prior to Admission medications   Medication Sig Start Date End Date Taking? Authorizing Provider  albuterol (VENTOLIN HFA) 108 (90 Base) MCG/ACT inhaler Inhale 2 puffs into the lungs every 6 (six) hours as needed for wheezing or shortness of breath. 10/16/18   Fisher, Roselyn Bering, PA-C  predniSONE (STERAPRED UNI-PAK 21 TAB) 10 MG (21) TBPK tablet Take 6 pills on day one then decrease by 1 pill each day 10/16/18   Faythe Ghee, PA-C     Allergies Patient has no known allergies.  Family History  Problem Relation Age of Onset  . Hypertension Father     Social History Social History   Tobacco Use  . Smoking status: Current Every Day Smoker    Packs/day: 0.25    Years: 5.00    Pack years: 1.25    Types: Cigarettes  . Smokeless tobacco: Never Used  Substance Use Topics  . Alcohol use: Yes    Comment: occasionally  . Drug use: No    Review of Systems  Constitutional: No fever/chills Eyes: No visual changes.  ENT: No loss of smell Cardiovascular:  Denies chest pain. Respiratory: Denies shortness of breath. Gastrointestinal: As above Genitourinary: Negative for hematuria Musculoskeletal: Negative for myalgias Skin: Negative for rash. Neurological: Negative for headaches or weakness   ____________________________________________   PHYSICAL EXAM:  VITAL SIGNS: ED Triage Vitals  Enc Vitals Group     BP 10/20/18 1150 (!) 143/98     Pulse Rate 10/20/18 1150 68     Resp 10/20/18 1150 18     Temp 10/20/18 1150 98.2 F (36.8 C)     Temp Source 10/20/18 1150 Oral     SpO2 10/20/18 1150 97 %     Weight 10/20/18 1153 127 kg (280 lb)     Height 10/20/18 1153 1.829 m (6')     Head Circumference --      Peak Flow --      Pain Score 10/20/18 1153 7     Pain Loc --      Pain Edu? --      Excl. in [place]? --     Constitutional: Alert and oriented.   Mouth/Throat: Mucous membranes are moist.   Neck:  Painless ROM Cardiovascular: Normal rate, regular rhythm. Grossly normal heart sounds.  Good peripheral circulation. Respiratory: Normal respiratory effort.  No retractions. Lungs CTAB. Gastrointestinal: Mild tenderness in the right lower quadrant.  Soft, no distention.  No CVA tenderness.  Musculoskeletal: .  Warm and well perfused  Neurologic:  Normal speech and language. No gross focal neurologic deficits are appreciated.  Skin:  Skin is warm, dry and intact. No rash noted. Psychiatric: Mood and affect are normal. Speech and behavior are normal.  ____________________________________________   LABS (all labs ordered are listed, but only abnormal results are displayed)  Labs Reviewed  LIPASE, BLOOD - Abnormal; Notable for the following components:      Result Value   Lipase 79 (*)    All other components within normal limits  CBC - Abnormal; Notable for the following components:   Hemoglobin 12.5 (*)    HCT 38.4 (*)    Platelets 405 (*)    All other components within normal limits  COMPREHENSIVE METABOLIC PANEL  URINALYSIS,  COMPLETE (UACMP) WITH MICROSCOPIC   ____________________________________________  EKG  ED ECG REPORT I, Lavonia Drafts, the attending physician, personally viewed and interpreted this ECG.  Date: 10/20/2018  Rhythm: normal sinus rhythm QRS Axis: normal Intervals: normal ST/T Wave abnormalities: normal Narrative Interpretation: no evidence of acute ischemia  ____________________________________________  RADIOLOGY  CT scan negative for appendicitis ____________________________________________   PROCEDURES  Procedure(s) performed: No  Procedures   Critical Care performed: No ____________________________________________   INITIAL IMPRESSION / ASSESSMENT AND PLAN / ED COURSE  Pertinent labs & imaging results that were available during my care of the patient were reviewed by me and considered in my medical decision making (see chart for details).  Patient presents with nausea vomiting abdominal pain, some diarrhea.  Is concerned about appendicitis does have some mild tenderness in the right lower quadrant.  Likely labs are reassuring, CT scan is unremarkable.  He is feeling better after IM Toradol and Zofran.  Appropriate for discharge at this time with outpatient follow-up.  Justin Reid was evaluated in Emergency Department on 10/20/2018 for the symptoms described in the history of present illness. He was evaluated in the context of the global COVID-19 pandemic, which necessitated consideration that the patient might be at risk for infection with the SARS-CoV-2 virus that causes COVID-19. Institutional protocols and algorithms that pertain to the evaluation of patients at risk for COVID-19 are in a state of rapid change based on information released by regulatory bodies including the CDC and federal and state organizations. These policies and algorithms were followed during the patient's care in the ED.    ____________________________________________   FINAL CLINICAL  IMPRESSION(S) / ED DIAGNOSES  Final diagnoses:  Gastroenteritis        Note:  This document was prepared using Dragon voice recognition software and may include unintentional dictation errors.   Lavonia Drafts, MD 10/20/18 1626

## 2018-10-20 NOTE — ED Triage Notes (Signed)
Patient c/o mid-abdominal pain since yesterday and has vomited x1. Patient reports seeing blood in emesis. Patient reports on-going nausea and heartburn.

## 2018-10-20 NOTE — ED Notes (Signed)
Patient transported to CT 

## 2018-11-15 ENCOUNTER — Other Ambulatory Visit: Payer: Self-pay

## 2018-11-15 ENCOUNTER — Encounter: Payer: Self-pay | Admitting: Emergency Medicine

## 2018-11-15 ENCOUNTER — Emergency Department
Admission: EM | Admit: 2018-11-15 | Discharge: 2018-11-15 | Disposition: A | Discharge status: Home / Self Care | Payer: Self-pay | Attending: Emergency Medicine | Admitting: Emergency Medicine

## 2018-11-15 DIAGNOSIS — I10 Essential (primary) hypertension: Secondary | ICD-10-CM | POA: Insufficient documentation

## 2018-11-15 DIAGNOSIS — M25711 Osteophyte, right shoulder: Secondary | ICD-10-CM | POA: Insufficient documentation

## 2018-11-15 DIAGNOSIS — F17210 Nicotine dependence, cigarettes, uncomplicated: Secondary | ICD-10-CM | POA: Insufficient documentation

## 2018-11-15 DIAGNOSIS — G8929 Other chronic pain: Secondary | ICD-10-CM | POA: Insufficient documentation

## 2018-11-15 DIAGNOSIS — J45909 Unspecified asthma, uncomplicated: Secondary | ICD-10-CM | POA: Insufficient documentation

## 2018-11-15 HISTORY — DX: Unspecified osteoarthritis, unspecified site: M19.90

## 2018-11-15 MED ORDER — DICLOFENAC SODIUM 50 MG PO TBEC: 50.0000 mg | DELAYED_RELEASE_TABLET | Freq: Two times a day (BID) | ORAL | 0 refills | Status: AC

## 2018-11-15 MED ORDER — NAPROXEN 500 MG PO TABS
500.0000 mg | ORAL_TABLET | Freq: Once | ORAL | Status: AC
  Administered 2018-11-15: 18:00:00 500 mg via ORAL
  Filled 2018-11-15: qty 1

## 2018-11-15 MED ORDER — CYCLOBENZAPRINE HCL 5 MG PO TABS: 5.0000 mg | ORAL_TABLET | Freq: Every day | ORAL | 0 refills | Status: AC

## 2018-11-15 MED ORDER — CYCLOBENZAPRINE HCL 10 MG PO TABS
10.0000 mg | ORAL_TABLET | Freq: Once | ORAL | Status: AC
  Administered 2018-11-15: 10 mg via ORAL
  Filled 2018-11-15: qty 1

## 2018-11-15 MED ORDER — HYDROCODONE-ACETAMINOPHEN 5-325 MG PO TABS: 1.0000 | ORAL_TABLET | Freq: Three times a day (TID) | ORAL | 0 refills | Status: AC | PRN

## 2018-11-15 NOTE — ED Notes (Signed)
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 Worse at night.

## 2018-11-15 NOTE — Discharge Instructions (Signed)
Your exam and previous XR confirm a large bone spur and arthritis in the shoulder joint. Take the prescription meds as directed. Apply ice and/or moist heat to reduce pain and inflammation. Follow-up with Ortho for further management. Consider [facility] Application at [facility] or [facility].

## 2018-11-15 NOTE — ED Triage Notes (Signed)
Pt in via POV, reports right shoulder pain x 2 weeks, denies recent injury.  Reports being seen for same in past, dx Arthritis.  NAD noted at this time.

## 2018-11-15 NOTE — ED Provider Notes (Signed)
[facility] Emergency Department Provider Note ____________________________________________  Time seen: 1710  I have reviewed the triage vital signs and the nursing notes.  HISTORY  Chief Complaint  Shoulder Pain  HPI Justin Reid is a 38 y.o. male presents himself to the ED for evaluation of chronic intermittent right shoulder pain.  Patient reports no recent injury, trauma, or accident.  He reports  a 2-week complaint of right shoulder discomfort.  He describes he has been diagnosed in the past with arthritis to the shoulder.  He has had no orthopedic follow-up for this complaint.  He denies any chest pain, shortness of breath, or diaphoresis.  Past Medical History:  Diagnosis Date  . Arthritis   . Asthma   . Hypertension     Patient Active Problem List   Diagnosis Date Noted  . Sepsis ([place]) 12/24/2017  . Asthma 07/07/2016  . Asthma exacerbation 02/20/2016    History reviewed. No pertinent surgical history.  Prior to Admission medications   Medication Sig Start Date End Date Taking? Authorizing Provider  albuterol (VENTOLIN HFA) 108 (90 Base) MCG/ACT inhaler Inhale 2 puffs into the lungs every 6 (six) hours as needed for wheezing or shortness of breath. 10/16/18   Fisher, Linden Dolin, PA-C  cyclobenzaprine (FLEXERIL) 5 MG tablet Take 1 tablet (5 mg total) by mouth at bedtime. 11/15/18 12/15/18  Thelma Lorenzetti, Dannielle Karvonen, PA-C  diclofenac (VOLTAREN) 50 MG EC tablet Take 1 tablet (50 mg total) by mouth 2 (two) times daily. 11/15/18 12/15/18  Sahara Fujimoto, Dannielle Karvonen, PA-C  HYDROcodone-acetaminophen (NORCO) 5-325 MG tablet Take 1 tablet by mouth 3 (three) times daily as needed for up to 3 days. 11/15/18 11/18/18  Noel Rodier, Dannielle Karvonen, PA-C  predniSONE (STERAPRED UNI-PAK 21 TAB) 10 MG (21) TBPK tablet Take 6 pills on day one then decrease by 1 pill each day 10/16/18   Versie Starks, PA-C    Allergies Patient has no known allergies.  Family History   Problem Relation Age of Onset  . Hypertension Father     Social History Social History   Tobacco Use  . Smoking status: Current Every Day Smoker    Packs/day: 0.25    Years: 5.00    Pack years: 1.25    Types: Cigarettes  . Smokeless tobacco: Never Used  Substance Use Topics  . Alcohol use: Yes  . Drug use: No    Review of Systems  Constitutional: Negative for fever. Eyes: Negative for visual changes. ENT: Negative for sore throat. Cardiovascular: Negative for chest pain. Respiratory: Negative for shortness of breath. Gastrointestinal: Negative for abdominal pain, vomiting and diarrhea. Genitourinary: Negative for dysuria. Musculoskeletal: Negative for back pain.  Right shoulder pain as above. Skin: Negative for rash. Neurological: Negative for headaches, focal weakness or numbness. ____________________________________________  PHYSICAL EXAM:  VITAL SIGNS: ED Triage Vitals  Enc Vitals Group     BP 11/15/18 1630 (!) 160/118     Pulse Rate 11/15/18 1630 84     Resp 11/15/18 1630 16     Temp 11/15/18 1630 98.6 F (37 C)     Temp Source 11/15/18 1630 Oral     SpO2 11/15/18 1630 97 %     Weight 11/15/18 1631 290 lb (131.5 kg)     Height 11/15/18 1631 5\' 11"  (1.803 m)     Head Circumference --      Peak Flow --      Pain Score 11/15/18 1631 8     Pain  Loc --      Pain Edu? --      Excl. in [place]? --     Constitutional: Alert and oriented. Well appearing and in no distress. Head: Normocephalic and atraumatic. Eyes: Conjunctivae are normal. Normal extraocular movements Neck: Supple.  Normal range of motion without crepitus. Cardiovascular: Normal rate, regular rhythm. Normal distal pulses. Respiratory: Normal respiratory effort. No wheezes/rales/rhonchi. Musculoskeletal: Right shoulder without obvious deformity or dislocation.  No sulcus sign is appreciated.  Patient with active range of motion limited by underlying osteophyte.  Decreased extension and abduction  ranges noted.  Rotator cuff testing appears to be intact on the right.  Nontender with normal range of motion in all extremities.  Neurologic: Cranial nerves II through XII grossly intact.  Normal gait without ataxia. Normal speech and language. No gross focal neurologic deficits are appreciated. Skin:  Skin is warm, dry and intact. No rash noted. ____________________________________________   RADIOLOGY  DG Right Shoulder (11/2017) FINDINGS: No acute fracture or dislocation. Moderate glenohumeral joint space narrowing with large inferior humeral head osteophyte. The acromioclavicular joint is unremarkable. Bone mineralization is normal. Soft tissues are unremarkable.  I, Lissa Hoard, personally viewed and evaluated these images (plain radiographs) as part of my medical decision making, as well as reviewing the written report by the radiologist. ____________________________________________  PROCEDURES  Cyclobenzaprine 10 mg p.o. Naproxen 500 mg p.o. Procedures ____________________________________________  INITIAL IMPRESSION / ASSESSMENT AND PLAN / ED COURSE  Patient with ED evaluation of chronic ongoing right shoulder pain.  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  Patient is having shoulder pain at this time related to his underlying bony arthropathy.  He is encouraged to follow-up with orthopedics for evaluation and possible surgical intervention.  Prescriptions for cyclobenzaprine, diclofenac, and a few hydrocodone provided for his benefit.  He will be discharged at the time of work note as requested.  He will follow with Ortho as discussed.  Justin Reid was evaluated in Emergency Department on 11/16/2018 for the symptoms described in the history of present illness. He was evaluated in the context of the global COVID-19 pandemic, which necessitated consideration that the patient might be at risk for infection  with the SARS-CoV-2 virus that causes COVID-19. Institutional protocols and algorithms that pertain to the evaluation of patients at risk for COVID-19 are in a state of rapid change based on information released by regulatory bodies including the CDC and federal and state organizations. These policies and algorithms were followed during the patient's care in the ED.  No controlled substance history on review of the [place] Controlled Substances Database.  ____________________________________________  FINAL CLINICAL IMPRESSION(S) / ED DIAGNOSES  Final diagnoses:  Osteophyte of right shoulder  Chronic right shoulder pain      Keino Placencia, Charlesetta Ivory, PA-C 11/16/18 2115    Concha Se, MD 11/21/18 0700

## 2019-01-09 ENCOUNTER — Other Ambulatory Visit: Payer: Self-pay

## 2019-01-09 ENCOUNTER — Emergency Department
Admission: EM | Admit: 2019-01-09 | Discharge: 2019-01-09 | Disposition: A | Discharge status: Home / Self Care | Payer: Self-pay | Attending: Emergency Medicine | Admitting: Emergency Medicine

## 2019-01-09 ENCOUNTER — Encounter: Payer: Self-pay | Admitting: Emergency Medicine

## 2019-01-09 DIAGNOSIS — F17210 Nicotine dependence, cigarettes, uncomplicated: Secondary | ICD-10-CM | POA: Insufficient documentation

## 2019-01-09 DIAGNOSIS — J45909 Unspecified asthma, uncomplicated: Secondary | ICD-10-CM | POA: Insufficient documentation

## 2019-01-09 DIAGNOSIS — I10 Essential (primary) hypertension: Secondary | ICD-10-CM | POA: Insufficient documentation

## 2019-01-09 DIAGNOSIS — R030 Elevated blood-pressure reading, without diagnosis of hypertension: Secondary | ICD-10-CM

## 2019-01-09 DIAGNOSIS — M5442 Lumbago with sciatica, left side: Secondary | ICD-10-CM | POA: Insufficient documentation

## 2019-01-09 MED ORDER — METHOCARBAMOL 500 MG PO TABS: ORAL_TABLET | ORAL | 0 refills | Status: DC

## 2019-01-09 MED ORDER — HYDROCODONE-ACETAMINOPHEN 5-325 MG PO TABS
1.0000 | ORAL_TABLET | Freq: Once | ORAL | Status: AC
  Administered 2019-01-09: 1 via ORAL
  Filled 2019-01-09: qty 1

## 2019-01-09 MED ORDER — HYDROCODONE-ACETAMINOPHEN 5-325 MG PO TABS: 1.0000 | ORAL_TABLET | Freq: Four times a day (QID) | ORAL | 0 refills | Status: DC | PRN

## 2019-01-09 MED ORDER — NAPROXEN 500 MG PO TABS: 500.0000 mg | ORAL_TABLET | Freq: Two times a day (BID) | ORAL | 0 refills | Status: DC

## 2019-01-09 MED ORDER — KETOROLAC TROMETHAMINE 30 MG/ML IJ SOLN
30.0000 mg | Freq: Once | INTRAMUSCULAR | Status: AC
  Administered 2019-01-09: 30 mg via INTRAMUSCULAR
  Filled 2019-01-09: qty 1

## 2019-01-09 MED ORDER — METHOCARBAMOL 500 MG PO TABS
1000.0000 mg | ORAL_TABLET | Freq: Once | ORAL | Status: AC
  Administered 2019-01-09: 15:00:00 1000 mg via ORAL
  Filled 2019-01-09: qty 2

## 2019-01-09 NOTE — Discharge Instructions (Addendum)
Follow-up with your primary care doctor to have your blood pressure rechecked.  Also a list of clinics is listed on your discharge papers to have your blood pressure rechecked including [facility] which [facility].  Take medication as directed.  Be aware that the methocarbamol and the hydrocodone could cause drowsiness and should not be taken while driving or operating machinery.  Take naproxen 500 mg every day twice a day with food.  Use ice or heat to your back as needed for discomfort.

## 2019-01-09 NOTE — ED Triage Notes (Signed)
Presents with lower back pain which is moving into left leg

## 2019-01-09 NOTE — ED Provider Notes (Signed)
[facility] Emergency Department Provider Note  ____________________________________________   First MD Initiated Contact with Patient 01/09/19 1450     (approximate)  I have reviewed the triage vital signs and the nursing notes.   HISTORY  Chief Complaint Back Pain   HPI Justin Reid is a 39 y.o. male presents to the ED with complaint of low back pain radiating into his left leg.  Patient states that it started yesterday.  He took Advil 200 mg once without any relief.  He denies any urinary symptoms.  He denies any previous injury to his back or history of sciatica.  Patient continues to ambulate without assistance.  He rates his pain as an 8 out of 10.       Past Medical History:  Diagnosis Date  . Arthritis   . Asthma   . Hypertension     Patient Active Problem List   Diagnosis Date Noted  . Sepsis ([place]) 12/24/2017  . Asthma 07/07/2016  . Asthma exacerbation 02/20/2016    History reviewed. No pertinent surgical history.  Prior to Admission medications   Medication Sig Start Date End Date Taking? Authorizing Provider  HYDROcodone-acetaminophen (NORCO/VICODIN) 5-325 MG tablet Take 1 tablet by mouth every 6 (six) hours as needed for moderate pain. 01/09/19   Johnn Hai, PA-C  methocarbamol (ROBAXIN) 500 MG tablet One or two every 6 hours as needed for muscle spasms. 01/09/19   Johnn Hai, PA-C  naproxen (NAPROSYN) 500 MG tablet Take 1 tablet (500 mg total) by mouth 2 (two) times daily with a meal. 01/09/19   Johnn Hai, PA-C    Allergies Patient has no known allergies.  Family History  Problem Relation Age of Onset  . Hypertension Father     Social History Social History   Tobacco Use  . Smoking status: Current Every Day Smoker    Packs/day: 0.25    Years: 5.00    Pack years: 1.25    Types: Cigarettes  . Smokeless tobacco: Never Used  Substance Use Topics  . Alcohol use: Yes  . Drug use: No    Review of  Systems Constitutional: No fever/chills Cardiovascular: Denies chest pain. Respiratory: Denies shortness of breath. Gastrointestinal: No abdominal pain.  No nausea, no vomiting.  Genitourinary: Negative for dysuria. Musculoskeletal: Positive for low back pain with left leg radiculopathy. Skin: Negative for rash. Neurological: Negative for headaches, focal weakness or numbness. ____________________________________________   PHYSICAL EXAM:  VITAL SIGNS: ED Triage Vitals  Enc Vitals Group     BP 01/09/19 1413 (!) 145/107     Pulse Rate 01/09/19 1413 69     Resp 01/09/19 1413 20     Temp 01/09/19 1413 98.5 F (36.9 C)     Temp Source 01/09/19 1413 Oral     SpO2 01/09/19 1413 96 %     Weight 01/09/19 1414 285 lb (129.3 kg)     Height 01/09/19 1414 5\' 11"  (1.803 m)     Head Circumference --      Peak Flow --      Pain Score 01/09/19 1444 8     Pain Loc --      Pain Edu? --      Excl. in [place]? --    Constitutional: Alert and oriented. Well appearing and in no acute distress. Eyes: Conjunctivae are normal.  Head: Atraumatic. Neck: No stridor.   Cardiovascular: Normal rate, regular rhythm. Grossly normal heart sounds.  Good peripheral circulation. Respiratory: Normal  respiratory effort.  No retractions. Lungs CTAB. Gastrointestinal: Soft and nontender. No distention.  Musculoskeletal: On examination of the lower back there is no gross deformity however there is tenderness on palpation of the left SI joint and surrounding tissue.  Range of motion is minimally restricted and no active muscle spasms were seen.  No point tenderness on palpation of the vertebral bodies and no step-offs are appreciated.  Good muscle strength bilaterally in the lower extremities and straight leg raises were negative.  Patient is able to ambulate without any assistance. Neurologic:  Normal speech and language. No gross focal neurologic deficits are appreciated. No gait instability. Skin:  Skin is warm, dry and  intact. No rash noted. Psychiatric: Mood and affect are normal. Speech and behavior are normal.  ____________________________________________   LABS (all labs ordered are listed, but only abnormal results are displayed)  Labs Reviewed - No data to display   PROCEDURES  Procedure(s) performed (including Critical Care):  Procedures  ____________________________________________   INITIAL IMPRESSION / ASSESSMENT AND PLAN / ED COURSE  As part of my medical decision making, I reviewed the following data within the electronic MEDICAL RECORD NUMBER Notes from prior ED visits and [place] Controlled Substance Database Justin Reid was evaluated in Emergency Department on 01/09/2019 for the symptoms described in the history of present illness. He was evaluated in the context of the global COVID-19 pandemic, which necessitated consideration that the patient might be at risk for infection with the SARS-CoV-2 virus that causes COVID-19. Institutional protocols and algorithms that pertain to the evaluation of patients at risk for COVID-19 are in a state of rapid change based on information released by regulatory bodies including the CDC and federal and state organizations. These policies and algorithms were followed during the patient's care in the ED.  39 year old male presents to the ED with complaint of low back pain with radiation into his left leg.  Patient denies any injury.  He also denies any past problems with his back.  He reports that he began having low back pain yesterday and took Aleve 200 mg once without any relief.  Patient also has elevated blood pressure but states he is not have hypertension.  He reports that he does not have a PCP.  This could be elevated from his pain however he was strongly encouraged to follow-up with 1 clinics listed on his discharge papers for recheck of his blood pressure and also treatment if his blood pressure remains elevated.  Patient was Toradol 30 mg IM,  methocarbamol 1000 mg p.o. and Norco.  Patient was given a note to remain out of work for the next 2 days.  He is encouraged to use ice or heat to his back.  Patient was given a prescription for naproxen 500 mg twice daily, methocarbamol, and Norco which was sent to his pharmacy.  ____________________________________________   FINAL CLINICAL IMPRESSION(S) / ED DIAGNOSES  Final diagnoses:  Acute left-sided low back pain with left-sided sciatica  Elevated blood pressure reading     ED Discharge Orders         Ordered    naproxen (NAPROSYN) 500 MG tablet  2 times daily with meals     01/09/19 1508    methocarbamol (ROBAXIN) 500 MG tablet     01/09/19 1508    HYDROcodone-acetaminophen (NORCO/VICODIN) 5-325 MG tablet  Every 6 hours PRN     01/09/19 1508           Note:  This document was prepared  using Conservation officer, historic buildings and may include unintentional dictation errors.    Tommi Rumps, PA-C 01/09/19 1521    Jene Every, MD 01/09/19 1534

## 2019-01-09 NOTE — ED Notes (Signed)
Pt confirms ride home.  

## 2019-01-09 NOTE — ED Notes (Signed)
Pt st Rx HTN med years ago. Pt not picking up med and not taking his BP due to st "note taking it period". PA Rhonda notified.

## 2019-01-12 ENCOUNTER — Telehealth: Payer: Self-pay | Admitting: Pharmacy Technician

## 2019-01-12 NOTE — Telephone Encounter (Signed)
Patient failed to provide requested 2020 financial documentation. No additional medication assistance will be provided by [facility] without the required proof of income documentation. Patient notified by letter.    [facility]  

## 2019-07-17 ENCOUNTER — Telehealth: Payer: Self-pay | Admitting: General Practice

## 2019-07-17 NOTE — Telephone Encounter (Signed)
Individual has been contacted 3+ times regarding ED referral and has been given information regarding the clinic. No further attempts to contact individual will be made. 

## 2019-09-29 ENCOUNTER — Emergency Department: Admission: EM | Admit: 2019-09-29 | Discharge: 2019-09-29 | Discharge status: Against Medical Advice | Payer: Self-pay

## 2019-09-29 NOTE — ED Notes (Signed)
Called for triage x1, no answer 

## 2019-10-05 ENCOUNTER — Emergency Department: Payer: HRSA Program

## 2019-10-05 ENCOUNTER — Encounter: Payer: Self-pay | Admitting: Emergency Medicine

## 2019-10-05 ENCOUNTER — Other Ambulatory Visit: Payer: Self-pay

## 2019-10-05 ENCOUNTER — Emergency Department
Admission: EM | Admit: 2019-10-05 | Discharge: 2019-10-05 | Disposition: A | Discharge status: Home / Self Care | Payer: HRSA Program | Attending: Student in an Organized Health Care Education/Training Program | Admitting: Student in an Organized Health Care Education/Training Program

## 2019-10-05 DIAGNOSIS — F17210 Nicotine dependence, cigarettes, uncomplicated: Secondary | ICD-10-CM | POA: Insufficient documentation

## 2019-10-05 DIAGNOSIS — U071 COVID-19: Secondary | ICD-10-CM | POA: Insufficient documentation

## 2019-10-05 DIAGNOSIS — J1282 Pneumonia due to coronavirus disease 2019: Secondary | ICD-10-CM | POA: Diagnosis not present

## 2019-10-05 DIAGNOSIS — J4521 Mild intermittent asthma with (acute) exacerbation: Secondary | ICD-10-CM | POA: Diagnosis not present

## 2019-10-05 DIAGNOSIS — I10 Essential (primary) hypertension: Secondary | ICD-10-CM | POA: Insufficient documentation

## 2019-10-05 DIAGNOSIS — R509 Fever, unspecified: Secondary | ICD-10-CM | POA: Diagnosis present

## 2019-10-05 LAB — RESPIRATORY PANEL BY RT PCR (FLU A&B, COVID)
Influenza A by PCR: NEGATIVE
Influenza B by PCR: NEGATIVE
SARS Coronavirus 2 by RT PCR: POSITIVE — AB

## 2019-10-05 MED ORDER — DEXAMETHASONE SODIUM PHOSPHATE 10 MG/ML IJ SOLN
10.0000 mg | Freq: Once | INTRAMUSCULAR | Status: AC
  Administered 2019-10-05: 10 mg via INTRAMUSCULAR
  Filled 2019-10-05: qty 1

## 2019-10-05 NOTE — Discharge Instructions (Signed)
You will need to quarantine at home for the next 10 to 14 days.  You are very contagious and everyone in your home will need to quarantine with you as they have already been exposed.  Continue using your albuterol inhaler if needed for wheezing from her asthma.  There are no medications or antibiotics to be written for treatment of Covid.  A referral for the infusion team was added to your discharge papers.  They will call for a screening to see if you are eligible for the infusion treatment.  Drink plenty of fluids to stay hydrated, Tylenol or ibuprofen as needed for fever and body aches.  Return to the emergency department if any severe worsening of your symptoms or difficulty breathing.

## 2019-10-05 NOTE — ED Notes (Signed)
See triage note  Presents with cough,some SOB and subjective fever  States sxs' started about  4 days ago  Low grade temp on arrival  also states he lost his taste

## 2019-10-05 NOTE — ED Triage Notes (Signed)
Pt reports loss of taste, smell, cough and some SOB for several days.

## 2019-10-05 NOTE — ED Provider Notes (Signed)
[facility] Emergency Department Provider Note   ____________________________________________   First MD Initiated Contact with Patient 10/05/19 0845     (approximate)  I have reviewed the triage vital signs and the nursing notes.   HISTORY  Chief Complaint Cough, Shortness of Breath, Generalized Body Aches, and Sore Throat   HPI Justin Reid is a 39 y.o. male presents to the ED with complaint of cough, shortness of breath, subjective fever along with loss of taste for the last 3 to 4 days.  Patient is unaware of any known Covid exposure.  He states that no one in his family have similar symptoms.  Patient has also had diarrhea but denies nausea or vomiting.  He has been using his inhaler at home with minimal relief.  Patient currently smokes approximately 1 cigarette/day.  Rates pain as 6 out of 10.        Past Medical History:  Diagnosis Date  . Arthritis   . Asthma   . Hypertension     Patient Active Problem List   Diagnosis Date Noted  . Sepsis ([facility]) 12/24/2017  . Asthma 07/07/2016  . Asthma exacerbation 02/20/2016    History reviewed. No pertinent surgical history.  Prior to Admission medications   Not on File    Allergies Patient has no known allergies.  Family History  Problem Relation Age of Onset  . Hypertension Father     Social History Social History   Tobacco Use  . Smoking status: Current Every Day Smoker    Packs/day: 0.25    Years: 5.00    Pack years: 1.25    Types: Cigarettes  . Smokeless tobacco: Never Used  Vaping Use  . Vaping Use: Never used  Substance Use Topics  . Alcohol use: Yes  . Drug use: No    Review of Systems Constitutional: Subjective fever/chills Eyes: No visual changes. ENT: No sore throat.  Loss of taste. Cardiovascular: Denies chest pain.  Positive for cough. Respiratory: Occasional shortness of breath. Gastrointestinal: No abdominal pain.  No nausea, no vomiting.  No diarrhea.    Musculoskeletal: Positive for muscle aches. Skin: Negative for rash. Neurological: Negative for headaches, focal weakness or numbness.  ____________________________________________   PHYSICAL EXAM:  VITAL SIGNS: ED Triage Vitals  Enc Vitals Group     BP 10/05/19 0835 138/78     Pulse Rate 10/05/19 0835 99     Resp 10/05/19 0835 18     Temp 10/05/19 0835 (!) 100.7 F (38.2 C)     Temp Source 10/05/19 0835 Oral     SpO2 10/05/19 0835 99 %     Weight 10/05/19 0808 285 lb (129.3 kg)     Height 10/05/19 0808 5\' 11"  (1.803 m)     Head Circumference --      Peak Flow --      Pain Score 10/05/19 0808 6     Pain Loc --      Pain Edu? --      Excl. in [place]? --     Constitutional: Alert and oriented. Well appearing and in no acute distress. Eyes: Conjunctivae are normal.  Head: Atraumatic. Nose: No congestion/rhinnorhea. Neck: No stridor.   Cardiovascular: Normal rate, regular rhythm. Grossly normal heart sounds.  Good peripheral circulation. Respiratory: Normal respiratory effort.  No retractions. Lungs CTAB. Gastrointestinal: Soft and nontender. No distention.  Musculoskeletal: Moves upper and lower extremities no difficulty and normal gait was noted. Neurologic:  Normal speech and language. No gross  focal neurologic deficits are appreciated. No gait instability. Skin:  Skin is warm, dry and intact. No rash noted. Psychiatric: Mood and affect are normal. Speech and behavior are normal.  ____________________________________________   LABS (all labs ordered are listed, but only abnormal results are displayed)  Labs Reviewed  RESPIRATORY PANEL BY RT PCR (FLU A&B, COVID) - Abnormal; Notable for the following components:      Result Value   SARS Coronavirus 2 by RT PCR POSITIVE (*)    All other components within normal limits   ____________________________________________  EKG  Deferred ____________________________________________  RADIOLOGY   Official radiology  report(s): DG Chest 2 View  Result Date: 10/05/2019 CLINICAL DATA:  Cough and shortness of breath. EXAM: CHEST - 2 VIEW COMPARISON:  March 07, 2018 FINDINGS: There is patchy airspace opacity in the mid and lower lung regions without consolidation. Heart size and pulmonary vascularity are normal. No adenopathy. No bone lesions. IMPRESSION: Patchy airspace opacity bilaterally. Suspect atypical organism pneumonia. Advise check of COVID-19 status. Heart size normal.  No adenopathy. Electronically Signed   By: Bretta Bang III M.D.   On: 10/05/2019 08:38    ____________________________________________   PROCEDURES  Procedure(s) performed (including Critical Care):  Procedures   ____________________________________________   INITIAL IMPRESSION / ASSESSMENT AND PLAN / ED COURSE  As part of my medical decision making, I reviewed the following data within the electronic MEDICAL RECORD NUMBER Notes from prior ED visits and [place] Controlled Substance Database  39 year old male presents to the ED with complaint of cough, congestion with some shortness of breath.  He is also noted a loss of taste and smell over the last 2 days.  Chest x-ray is consistent with Covid pneumonia.  Prior to discharge patient's Covid test was reported positive.  Patient was made aware.  He was given a note to remain out of work and also instructions to notify family or anyone that he has been around that they also will need to quarantine.  He is aware that he needs to return to the emergency department if any severe worsening of his symptoms such as shortness of breath or difficulty breathing.  Patient also has a history of asthma and has been using his inhaler that he has at home.  Decadron 10 mg IM was given prior to discharge and patient is to continue using his inhaler as prescribed by his doctor.  O2 sat at the time of discharge was 99%.  ____________________________________________   FINAL CLINICAL IMPRESSION(S) / ED  DIAGNOSES  Final diagnoses:  Pneumonia due to COVID-19 virus  Mild intermittent asthma with exacerbation     ED Discharge Orders    None      *Please note:  Justin Reid was evaluated in Emergency Department on 10/05/2019 for the symptoms described in the history of present illness. He was evaluated in the context of the global COVID-19 pandemic, which necessitated consideration that the patient might be at risk for infection with the SARS-CoV-2 virus that causes COVID-19. Institutional protocols and algorithms that pertain to the evaluation of patients at risk for COVID-19 are in a state of rapid change based on information released by regulatory bodies including the CDC and federal and state organizations. These policies and algorithms were followed during the patient's care in the ED.  Some ED evaluations and interventions may be delayed as a result of limited staffing during and the pandemic.*   Note:  This document was prepared using Dragon voice recognition software and may include  unintentional dictation errors.    Tommi Rumps, PA-C 10/05/19 1216    Willy Eddy, MD 10/05/19 1230

## 2019-10-06 ENCOUNTER — Telehealth: Payer: Self-pay | Admitting: Physician Assistant

## 2019-10-06 NOTE — Telephone Encounter (Signed)
Called to Discuss with patient about Covid symptoms and the use of the monoclonal antibody infusion for those with mild to moderate Covid symptoms and at a high risk of hospitalization.     Pt appears to qualify for this infusion due to co-morbid conditions and/or a member of an at-risk group in accordance with the FDA Emergency Use Authorization (BMI, HTN, asthma, SVI).   Unable to reach pt, left a voicemail with hotline number. No MyChart.   Marcelino Duster, PA-C 10/06/2019, 9:11 AM [phone] [facility] [address] [place], [place] [address]

## 2019-10-07 ENCOUNTER — Inpatient Hospital Stay
Admission: EM | Admit: 2019-10-07 | Discharge: 2019-10-11 | DRG: 177 | Disposition: A | Discharge status: Home / Self Care | Payer: HRSA Program | Attending: Internal Medicine | Admitting: Internal Medicine

## 2019-10-07 ENCOUNTER — Emergency Department: Payer: HRSA Program

## 2019-10-07 ENCOUNTER — Inpatient Hospital Stay: Payer: HRSA Program

## 2019-10-07 ENCOUNTER — Other Ambulatory Visit: Payer: Self-pay

## 2019-10-07 ENCOUNTER — Encounter: Payer: Self-pay | Admitting: Family Medicine

## 2019-10-07 DIAGNOSIS — E876 Hypokalemia: Secondary | ICD-10-CM | POA: Diagnosis present

## 2019-10-07 DIAGNOSIS — E669 Obesity, unspecified: Secondary | ICD-10-CM | POA: Diagnosis present

## 2019-10-07 DIAGNOSIS — F17210 Nicotine dependence, cigarettes, uncomplicated: Secondary | ICD-10-CM | POA: Diagnosis present

## 2019-10-07 DIAGNOSIS — R509 Fever, unspecified: Secondary | ICD-10-CM

## 2019-10-07 DIAGNOSIS — M1990 Unspecified osteoarthritis, unspecified site: Secondary | ICD-10-CM | POA: Diagnosis present

## 2019-10-07 DIAGNOSIS — J9601 Acute respiratory failure with hypoxia: Secondary | ICD-10-CM | POA: Diagnosis present

## 2019-10-07 DIAGNOSIS — D649 Anemia, unspecified: Secondary | ICD-10-CM | POA: Diagnosis present

## 2019-10-07 DIAGNOSIS — Z8249 Family history of ischemic heart disease and other diseases of the circulatory system: Secondary | ICD-10-CM

## 2019-10-07 DIAGNOSIS — Z6839 Body mass index (BMI) 39.0-39.9, adult: Secondary | ICD-10-CM | POA: Diagnosis not present

## 2019-10-07 DIAGNOSIS — J45909 Unspecified asthma, uncomplicated: Secondary | ICD-10-CM | POA: Diagnosis present

## 2019-10-07 DIAGNOSIS — R0902 Hypoxemia: Secondary | ICD-10-CM

## 2019-10-07 DIAGNOSIS — M546 Pain in thoracic spine: Secondary | ICD-10-CM | POA: Diagnosis present

## 2019-10-07 DIAGNOSIS — E871 Hypo-osmolality and hyponatremia: Secondary | ICD-10-CM

## 2019-10-07 DIAGNOSIS — U071 COVID-19: Principal | ICD-10-CM

## 2019-10-07 DIAGNOSIS — Z79899 Other long term (current) drug therapy: Secondary | ICD-10-CM | POA: Diagnosis not present

## 2019-10-07 DIAGNOSIS — I10 Essential (primary) hypertension: Secondary | ICD-10-CM | POA: Diagnosis present

## 2019-10-07 DIAGNOSIS — R0603 Acute respiratory distress: Secondary | ICD-10-CM

## 2019-10-07 DIAGNOSIS — M549 Dorsalgia, unspecified: Secondary | ICD-10-CM

## 2019-10-07 DIAGNOSIS — E875 Hyperkalemia: Secondary | ICD-10-CM | POA: Diagnosis not present

## 2019-10-07 DIAGNOSIS — J1282 Pneumonia due to coronavirus disease 2019: Secondary | ICD-10-CM | POA: Diagnosis present

## 2019-10-07 LAB — CBC WITH DIFFERENTIAL/PLATELET
Abs Immature Granulocytes: 0.05 10*3/uL (ref 0.00–0.07)
Basophils Absolute: 0 10*3/uL (ref 0.0–0.1)
Basophils Relative: 0 %
Eosinophils Absolute: 0 10*3/uL (ref 0.0–0.5)
Eosinophils Relative: 0 %
HCT: 35.8 % — ABNORMAL LOW (ref 39.0–52.0)
Hemoglobin: 12 g/dL — ABNORMAL LOW (ref 13.0–17.0)
Immature Granulocytes: 1 %
Lymphocytes Relative: 10 %
Lymphs Abs: 0.9 10*3/uL (ref 0.7–4.0)
MCH: 26.8 pg (ref 26.0–34.0)
MCHC: 33.5 g/dL (ref 30.0–36.0)
MCV: 79.9 fL — ABNORMAL LOW (ref 80.0–100.0)
Monocytes Absolute: 0.2 10*3/uL (ref 0.1–1.0)
Monocytes Relative: 2 %
Neutro Abs: 7.2 10*3/uL (ref 1.7–7.7)
Neutrophils Relative %: 87 %
Platelets: 291 10*3/uL (ref 150–400)
RBC: 4.48 MIL/uL (ref 4.22–5.81)
RDW: 14.6 % (ref 11.5–15.5)
WBC: 8.3 10*3/uL (ref 4.0–10.5)
nRBC: 0 % (ref 0.0–0.2)

## 2019-10-07 LAB — COMPREHENSIVE METABOLIC PANEL
ALT: 25 U/L (ref 0–44)
AST: 53 U/L — ABNORMAL HIGH (ref 15–41)
Albumin: 3.5 g/dL (ref 3.5–5.0)
Alkaline Phosphatase: 38 U/L (ref 38–126)
Anion gap: 8 (ref 5–15)
BUN: 16 mg/dL (ref 6–20)
CO2: 29 mmol/L (ref 22–32)
Calcium: 8.4 mg/dL — ABNORMAL LOW (ref 8.9–10.3)
Chloride: 93 mmol/L — ABNORMAL LOW (ref 98–111)
Creatinine, Ser: 1.13 mg/dL (ref 0.61–1.24)
GFR calc Af Amer: >60 mL/min (ref >60)
GFR calc non Af Amer: >60 mL/min (ref >60)
Glucose, Bld: 121 mg/dL — ABNORMAL HIGH (ref 70–99)
Potassium: 2.8 mmol/L — ABNORMAL LOW (ref 3.5–5.1)
Sodium: 130 mmol/L — ABNORMAL LOW (ref 135–145)
Total Bilirubin: 0.5 mg/dL (ref 0.3–1.2)
Total Protein: 7.3 g/dL (ref 6.5–8.1)

## 2019-10-07 LAB — TYPE AND SCREEN
ABO/RH(D): O POS
Antibody Screen: NEGATIVE

## 2019-10-07 LAB — C-REACTIVE PROTEIN: CRP: 5.7 mg/dL — ABNORMAL HIGH (ref <1.0)

## 2019-10-07 LAB — TROPONIN I (HIGH SENSITIVITY)
Troponin I (High Sensitivity): 16 ng/L (ref <18)
Troponin I (High Sensitivity): 19 ng/L — ABNORMAL HIGH (ref <18)

## 2019-10-07 LAB — PROCALCITONIN: Procalcitonin: <0.1 ng/mL

## 2019-10-07 LAB — FIBRINOGEN: Fibrinogen: 607 mg/dL — ABNORMAL HIGH (ref 210–475)

## 2019-10-07 LAB — BRAIN NATRIURETIC PEPTIDE: B Natriuretic Peptide: 30 pg/mL (ref 0.0–100.0)

## 2019-10-07 LAB — LACTATE DEHYDROGENASE: LDH: 252 U/L — ABNORMAL HIGH (ref 98–192)

## 2019-10-07 LAB — HIV ANTIBODY (ROUTINE TESTING W REFLEX): HIV Screen 4th Generation wRfx: NONREACTIVE

## 2019-10-07 LAB — MAGNESIUM: Magnesium: 2.1 mg/dL (ref 1.7–2.4)

## 2019-10-07 LAB — LACTIC ACID, PLASMA: Lactic Acid, Venous: 1.9 mmol/L (ref 0.5–1.9)

## 2019-10-07 LAB — FERRITIN: Ferritin: 970 ng/mL — ABNORMAL HIGH (ref 24–336)

## 2019-10-07 LAB — FIBRIN DERIVATIVES D-DIMER (ARMC ONLY): Fibrin derivatives D-dimer (ARMC): 598.98 ng/mL (FEU) — ABNORMAL HIGH (ref 0.00–499.00)

## 2019-10-07 LAB — HEPATITIS B SURFACE ANTIGEN: Hepatitis B Surface Ag: NONREACTIVE

## 2019-10-07 MED ORDER — POTASSIUM CHLORIDE CRYS ER 20 MEQ PO TBCR
40.0000 meq | EXTENDED_RELEASE_TABLET | Freq: Once | ORAL | Status: AC
  Administered 2019-10-07: 40 meq via ORAL
  Filled 2019-10-07: qty 2

## 2019-10-07 MED ORDER — POTASSIUM CHLORIDE 10 MEQ/100ML IV SOLN
10.0000 meq | INTRAVENOUS | Status: AC
  Administered 2019-10-07 (×2): 10 meq via INTRAVENOUS
  Filled 2019-10-07: qty 100

## 2019-10-07 MED ORDER — ORAL CARE MOUTH RINSE
15.0000 mL | Freq: Two times a day (BID) | OROMUCOSAL | Status: DC
  Administered 2019-10-07 – 2019-10-11 (×9): 15 mL via OROMUCOSAL

## 2019-10-07 MED ORDER — SODIUM CHLORIDE 0.9 % IV SOLN: INTRAVENOUS | Status: DC

## 2019-10-07 MED ORDER — ASCORBIC ACID 500 MG PO TABS
500.0000 mg | ORAL_TABLET | Freq: Every day | ORAL | Status: DC
  Administered 2019-10-07 – 2019-10-11 (×5): 500 mg via ORAL
  Filled 2019-10-07 (×5): qty 1

## 2019-10-07 MED ORDER — ASPIRIN EC 81 MG PO TBEC
81.0000 mg | DELAYED_RELEASE_TABLET | Freq: Every day | ORAL | Status: DC
  Administered 2019-10-07 – 2019-10-11 (×5): 81 mg via ORAL
  Filled 2019-10-07 (×5): qty 1

## 2019-10-07 MED ORDER — SODIUM CHLORIDE 0.9 % IV SOLN: 100.0000 mg | Freq: Every day | INTRAVENOUS | Status: DC

## 2019-10-07 MED ORDER — PREDNISONE 50 MG PO TABS
50.0000 mg | ORAL_TABLET | Freq: Every day | ORAL | Status: DC
  Administered 2019-10-10 – 2019-10-11 (×2): 50 mg via ORAL
  Filled 2019-10-07 (×2): qty 1

## 2019-10-07 MED ORDER — GUAIFENESIN-DM 100-10 MG/5ML PO SYRP
10.0000 mL | ORAL_SOLUTION | ORAL | Status: DC | PRN
  Administered 2019-10-10: 06:00:00 10 mL via ORAL
  Filled 2019-10-07: qty 10

## 2019-10-07 MED ORDER — SODIUM CHLORIDE 0.9 % IV SOLN
100.0000 mg | Freq: Every day | INTRAVENOUS | Status: AC
  Administered 2019-10-08 – 2019-10-11 (×4): 100 mg via INTRAVENOUS
  Filled 2019-10-07 (×4): qty 20

## 2019-10-07 MED ORDER — ENOXAPARIN SODIUM 40 MG/0.4ML ~~LOC~~ SOLN
40.0000 mg | Freq: Two times a day (BID) | SUBCUTANEOUS | Status: DC
  Administered 2019-10-07 – 2019-10-11 (×9): 40 mg via SUBCUTANEOUS
  Filled 2019-10-07 (×9): qty 0.4

## 2019-10-07 MED ORDER — MORPHINE SULFATE (PF) 4 MG/ML IV SOLN
4.0000 mg | Freq: Once | INTRAVENOUS | Status: AC
  Administered 2019-10-07: 4 mg via INTRAVENOUS
  Filled 2019-10-07: qty 1

## 2019-10-07 MED ORDER — FAMOTIDINE 20 MG PO TABS
20.0000 mg | ORAL_TABLET | Freq: Two times a day (BID) | ORAL | Status: DC
  Administered 2019-10-07 – 2019-10-11 (×9): 20 mg via ORAL
  Filled 2019-10-07 (×10): qty 1

## 2019-10-07 MED ORDER — METHYLPREDNISOLONE SODIUM SUCC 125 MG IJ SOLR
125.0000 mg | Freq: Two times a day (BID) | INTRAMUSCULAR | Status: AC
  Administered 2019-10-07 – 2019-10-09 (×6): 125 mg via INTRAVENOUS
  Filled 2019-10-07 (×6): qty 2

## 2019-10-07 MED ORDER — BARICITINIB 2 MG PO TABS
4.0000 mg | ORAL_TABLET | Freq: Every day | ORAL | Status: DC
  Administered 2019-10-07 – 2019-10-11 (×5): 4 mg via ORAL
  Filled 2019-10-07 (×5): qty 2

## 2019-10-07 MED ORDER — POTASSIUM CHLORIDE CRYS ER 20 MEQ PO TBCR
40.0000 meq | EXTENDED_RELEASE_TABLET | Freq: Three times a day (TID) | ORAL | Status: DC
  Administered 2019-10-07 – 2019-10-08 (×4): 40 meq via ORAL
  Filled 2019-10-07 (×3): qty 2

## 2019-10-07 MED ORDER — AEROCHAMBER PLUS FLO-VU MEDIUM MISC
1.0000 | Freq: Once | Status: AC
  Administered 2019-10-07: 06:00:00 1
  Filled 2019-10-07: qty 1

## 2019-10-07 MED ORDER — TRAZODONE HCL 50 MG PO TABS
25.0000 mg | ORAL_TABLET | Freq: Every evening | ORAL | Status: DC | PRN
  Administered 2019-10-10: 21:00:00 25 mg via ORAL
  Filled 2019-10-07: qty 1

## 2019-10-07 MED ORDER — GUAIFENESIN ER 600 MG PO TB12
600.0000 mg | ORAL_TABLET | Freq: Two times a day (BID) | ORAL | Status: DC
  Administered 2019-10-07 – 2019-10-11 (×9): 600 mg via ORAL
  Filled 2019-10-07 (×10): qty 1

## 2019-10-07 MED ORDER — ALBUTEROL SULFATE HFA 108 (90 BASE) MCG/ACT IN AERS
2.0000 | INHALATION_SPRAY | RESPIRATORY_TRACT | Status: DC | PRN
  Administered 2019-10-07: 06:00:00 2 via RESPIRATORY_TRACT
  Filled 2019-10-07: qty 6.7

## 2019-10-07 MED ORDER — HYDROCOD POLST-CPM POLST ER 10-8 MG/5ML PO SUER
5.0000 mL | Freq: Two times a day (BID) | ORAL | Status: DC | PRN
  Administered 2019-10-09 – 2019-10-11 (×3): 5 mL via ORAL
  Filled 2019-10-07 (×4): qty 5

## 2019-10-07 MED ORDER — VITAMIN D 25 MCG (1000 UNIT) PO TABS
1000.0000 [IU] | ORAL_TABLET | Freq: Every day | ORAL | Status: DC
  Administered 2019-10-07 – 2019-10-11 (×5): 1000 [IU] via ORAL
  Filled 2019-10-07 (×5): qty 1

## 2019-10-07 MED ORDER — ONDANSETRON HCL 4 MG/2ML IJ SOLN: 4.0000 mg | Freq: Four times a day (QID) | INTRAMUSCULAR | Status: DC | PRN

## 2019-10-07 MED ORDER — POTASSIUM CHLORIDE 20 MEQ PO PACK
40.0000 meq | PACK | Freq: Once | ORAL | Status: AC
  Administered 2019-10-07: 09:00:00 40 meq via ORAL
  Filled 2019-10-07 (×2): qty 2

## 2019-10-07 MED ORDER — SODIUM CHLORIDE 0.9 % IV SOLN: 1.0000 mg/kg | Freq: Two times a day (BID) | INTRAVENOUS | Status: DC

## 2019-10-07 MED ORDER — ZINC SULFATE 220 (50 ZN) MG PO CAPS
220.0000 mg | ORAL_CAPSULE | Freq: Every day | ORAL | Status: DC
  Administered 2019-10-07 – 2019-10-11 (×5): 220 mg via ORAL
  Filled 2019-10-07 (×5): qty 1

## 2019-10-07 MED ORDER — MAGNESIUM HYDROXIDE 400 MG/5ML PO SUSP
30.0000 mL | Freq: Every day | ORAL | Status: DC | PRN
  Filled 2019-10-07: qty 30

## 2019-10-07 MED ORDER — ONDANSETRON HCL 4 MG PO TABS: 4.0000 mg | ORAL_TABLET | Freq: Four times a day (QID) | ORAL | Status: DC | PRN

## 2019-10-07 MED ORDER — SODIUM CHLORIDE 0.9 % IV SOLN
200.0000 mg | Freq: Once | INTRAVENOUS | Status: AC
  Administered 2019-10-07: 200 mg via INTRAVENOUS
  Filled 2019-10-07: qty 200

## 2019-10-07 MED ORDER — SODIUM CHLORIDE 0.9 % IV SOLN: 200.0000 mg | Freq: Once | INTRAVENOUS | Status: DC

## 2019-10-07 MED ORDER — OXYCODONE HCL 5 MG PO TABS: 5.0000 mg | ORAL_TABLET | ORAL | Status: DC | PRN

## 2019-10-07 MED ORDER — PREDNISONE 20 MG PO TABS: 50.0000 mg | ORAL_TABLET | Freq: Every day | ORAL | Status: DC

## 2019-10-07 MED ORDER — ONDANSETRON HCL 4 MG/2ML IJ SOLN
4.0000 mg | Freq: Once | INTRAMUSCULAR | Status: AC
  Administered 2019-10-07: 4 mg via INTRAVENOUS
  Filled 2019-10-07: qty 2

## 2019-10-07 MED ORDER — ACETAMINOPHEN 325 MG PO TABS
650.0000 mg | ORAL_TABLET | Freq: Four times a day (QID) | ORAL | Status: DC | PRN
  Administered 2019-10-07 – 2019-10-10 (×2): 650 mg via ORAL
  Filled 2019-10-07 (×2): qty 2

## 2019-10-07 MED ORDER — MORPHINE SULFATE (PF) 2 MG/ML IV SOLN
2.0000 mg | INTRAVENOUS | Status: DC | PRN
  Administered 2019-10-07: 10:00:00 2 mg via INTRAVENOUS
  Filled 2019-10-07: qty 1

## 2019-10-07 NOTE — Progress Notes (Signed)
Patient ID: Justin Reid, male   DOB: 1980/06/19, 39 y.o.   MRN: [id number] [facility] PROGRESS NOTE  Justin Reid JOI:[id number] DOB: 17-Sep-1980 DOA: 10/07/2019 PCP: Patient, No Pcp Per  HPI/Subjective: Patient feels some shortness of breath cough and wheezing.  Having some diarrhea.  Not feeling great.  Having some severe back pain with coughing a lot.  Admitted with COVID-19 pneumonia  Objective: Vitals:   10/07/19 0334 10/07/19 0836  BP: (!) 145/89 137/79  Pulse: 89 92  Resp: 13 18  Temp: 98.5 F (36.9 C) 99.7 F (37.6 C)  SpO2: 93% 96%    Intake/Output Summary (Last 24 hours) at 10/07/2019 1245 Last data filed at 10/07/2019 0600 Gross per 24 hour  Intake 476.93 ml  Output --  Net 476.93 ml   Filed Weights   10/07/19 0125 10/07/19 0334  Weight: 136.1 kg 132 kg    ROS: Review of Systems  Respiratory: Positive for cough, shortness of breath and wheezing.   Cardiovascular: Negative for chest pain.  Gastrointestinal: Negative for abdominal pain, nausea and vomiting.  Musculoskeletal: Positive for back pain.   Exam: Physical Exam HENT:     Head: Normocephalic.     Nose: No mucosal edema.     Mouth/Throat:     Pharynx: No oropharyngeal exudate.  Eyes:     General: Lids are normal.     Conjunctiva/sclera: Conjunctivae normal.     Pupils: Pupils are equal, round, and reactive to light.  Cardiovascular:     Rate and Rhythm: Normal rate and regular rhythm.     Heart sounds: Normal heart sounds, S1 normal and S2 normal.  Pulmonary:     Breath sounds: Examination of the right-middle field reveals wheezing. Examination of the left-middle field reveals wheezing. Examination of the right-lower field reveals decreased breath sounds and wheezing. Examination of the left-lower field reveals decreased breath sounds and wheezing. Decreased breath sounds and wheezing present.  Abdominal:     Palpations: Abdomen is soft.     Tenderness: There is no abdominal  tenderness.  Musculoskeletal:     Right lower leg: Swelling present.     Left lower leg: Swelling present.  Skin:    General: Skin is warm.     Findings: No rash.  Neurological:     Mental Status: He is alert and oriented to person, place, and time.       Data Reviewed: Basic Metabolic Panel: Recent Labs  Lab 10/07/19 0109 10/07/19 0449  NA 130*  --   K 2.8*  --   CL 93*  --   CO2 29  --   GLUCOSE 121*  --   BUN 16  --   CREATININE 1.13  --   CALCIUM 8.4*  --   MG  --  2.1   Liver Function Tests: Recent Labs  Lab 10/07/19 0109  AST 53*  ALT 25  ALKPHOS 38  BILITOT 0.5  PROT 7.3  ALBUMIN 3.5   CBC: Recent Labs  Lab 10/07/19 0109  WBC 8.3  NEUTROABS 7.2  HGB 12.0*  HCT 35.8*  MCV 79.9*  PLT 291   BNP (last 3 results) Recent Labs    10/07/19 0109  BNP 30.0    Recent Results (from the past 240 hour(s))  Respiratory Panel by RT PCR (Flu A&B, Covid) - Nasopharyngeal Swab     Status: Abnormal   Collection Time: 10/05/19  9:07 AM   Specimen: Nasopharyngeal Swab  Result Value Ref Range Status  SARS Coronavirus 2 by RT PCR POSITIVE (A) NEGATIVE Final    Comment: RESULT CALLED TO, READ BACK BY AND VERIFIED WITH: LINDA MCLAMB AT 1025 ON 10/05/2019 [facility]. (NOTE) SARS-CoV-2 target nucleic acids are DETECTED.  SARS-CoV-2 RNA is generally detectable in upper respiratory specimens  during the acute phase of infection. Positive results are indicative of the presence of the identified virus, but do not rule out bacterial infection or co-infection with other pathogens not detected by the test. Clinical correlation with patient history and other diagnostic information is necessary to determine patient infection status. The expected result is Negative.  Fact Sheet for Patients:  https://www.moore.com/  Fact Sheet for Healthcare Providers: https://www.young.biz/  This test is not yet approved or cleared by the [place] FDA and  has been authorized for detection and/or diagnosis of SARS-CoV-2 by FDA under an Emergency Use Authorization (EUA).  This EUA will remain in effect (meaning this test can  be used) for the duration of  the COVID-19 declaration under Section 564(b)(1) of the Act, 21 U.S.C. section 360bbb-3(b)(1), unless the authorization is terminated or revoked sooner.      Influenza A by PCR NEGATIVE NEGATIVE Final   Influenza B by PCR NEGATIVE NEGATIVE Final    Comment: (NOTE) The Xpert Xpress SARS-CoV-2/FLU/RSV assay is intended as an aid in  the diagnosis of influenza from Nasopharyngeal swab specimens and  should not be used as a sole basis for treatment. Nasal washings and  aspirates are unacceptable for Xpert Xpress SARS-CoV-2/FLU/RSV  testing.  Fact Sheet for Patients: https://www.moore.com/  Fact Sheet for Healthcare Providers: https://www.young.biz/  This test is not yet approved or cleared by the [place] FDA and  has been authorized for detection and/or diagnosis of SARS-CoV-2 by  FDA under an Emergency Use Authorization (EUA). This EUA will remain  in effect (meaning this test can be used) for the duration of the  Covid-19 declaration under Section 564(b)(1) of the Act, 21  U.S.C. section 360bbb-3(b)(1), unless the authorization is  terminated or revoked. Performed at [facility], [address] [place], [place] [address]   Culture, blood (Routine X 2) w Reflex to ID Panel     Status: None (Preliminary result)   Collection Time: 10/07/19  1:10 AM   Specimen: BLOOD  Result Value Ref Range Status   Specimen Description BLOOD LEFT ANTECUBITAL  Final   Special Requests   Final    BOTTLES DRAWN AEROBIC AND ANAEROBIC Blood Culture adequate volume   Culture   Final    NO GROWTH < 12 HOURS Performed at [facility], [address] [place], [place] [address]    Report Status PENDING  Incomplete   Culture, blood (Routine X 2) w Reflex to ID Panel     Status: None (Preliminary result)   Collection Time: 10/07/19  1:10 AM   Specimen: BLOOD  Result Value Ref Range Status   Specimen Description BLOOD BLOOD RIGHT FOREARM  Final   Special Requests   Final    BOTTLES DRAWN AEROBIC AND ANAEROBIC Blood Culture adequate volume   Culture   Final    NO GROWTH < 12 HOURS Performed at [facility], [address], [place], [place] [address]    Report Status PENDING  Incomplete     Studies: DG Chest Port 1 View  Result Date: 10/07/2019 CLINICAL DATA:  Shortness of breath and COVID EXAM: PORTABLE CHEST 1 VIEW COMPARISON:  None. FINDINGS: The heart size and mediastinal contours are  within normal limits. Interval slight worsening in the multifocal patchy airspace opacities seen throughout mid to lower lungs. No pleural effusion is. No acute osseous abnormality. Rounded loose bodies are seen adjacent to the subcoracoid recess. IMPRESSION: Interval slight worsening in the multifocal airspace opacities, likely consistent with multifocal pneumonia. Electronically Signed   By: Jonna Clark M.D.   On: 10/07/2019 01:41    Scheduled Meds: . vitamin C  500 mg Oral Daily  . aspirin EC  81 mg Oral Daily  . baricitinib  4 mg Oral Daily  . cholecalciferol  1,000 Units Oral Daily  . enoxaparin (LOVENOX) injection  40 mg Subcutaneous Q12H  . famotidine  20 mg Oral BID  . guaiFENesin  600 mg Oral BID  . methylPREDNISolone (SOLU-MEDROL) injection  125 mg Intravenous Q12H   Followed by  . [START ON 10/10/2019] predniSONE  50 mg Oral Daily  . potassium chloride  40 mEq Oral TID  . zinc sulfate  220 mg Oral Daily   Continuous Infusions: . potassium chloride 10 mEq (10/07/19 1240)  . [START ON 10/08/2019] remdesivir 100 mg in NS 100 mL      Assessment/Plan:  1. Acute hypoxic respiratory failure secondary to COVID-19 pneumonia.  I see one documentation of a pulse ox of 89% on room air.  Patient  currently on 2 L. 2. COVID-19 pneumonia, diffuse wheezing.  Continue Solu-Medrol.  Admitting physician started baricitinib.  Continue remdesivir day 1 today. 3. Severe hypokalemia.  Replace potassium 3 times orally today and 2 runs IV. 4. Hyponatremia.  Patient was given IV fluids.  Recheck tomorrow. 5. Severe mid back pain we will get an x-ray of the thoracic spine.      Code Status:     Code Status Orders  (From admission, onward)         Start     Ordered   10/07/19 0303  Full code  Continuous        10/07/19 0311        Code Status History    Date Active Date Inactive Code Status Order ID Comments User Context   12/24/2017 2303 12/26/2017 1612 Full Code [id number]  Bertrum Sol, MD Inpatient   07/06/2016 2042 07/08/2016 1424 Full Code [id number]  Shaune Pollack, MD Inpatient   02/20/2016 0632 02/23/2016 1521 Full Code [id number]  Hugelmeyer, Jon Gills, DO Inpatient   Advance Care Planning Activity     Family Communication: Spoke with wife on the phone Disposition Plan: Status is: Inpatient  Dispo: The patient is from: Home              Anticipated d/c is to: Home              Anticipated d/c date is: Would like to get the oxygen off prior to disposition              Patient currently day 1 remdesivir today being treated for COVID-19 pneumonia.  Time spent: 36 minutes  [employer]

## 2019-10-07 NOTE — ED Notes (Signed)
After pt was taken to floor, ED provider informed RN that EKG never crossed into the system, and hard copy was never printed. Pt was on 12 lead EKG monitoring while in the ER room. RN called unit that pt was taken to, and unit secretary stated she will tell the primary RN. This RN notified admitting provider, Dr. Arville Care via secure messaging and informed that as well that there is not a documented EKG for this visit.

## 2019-10-07 NOTE — ED Notes (Signed)
Pt placed on cardiac, bp and pulse ox monitoring. Pt placed on 2LPM via [place] of oxygen

## 2019-10-07 NOTE — ED Provider Notes (Signed)
[facility] Emergency Department Provider Note   ____________________________________________   First MD Initiated Contact with Patient 10/07/19 0104     (approximate)  I have reviewed the triage vital signs and the nursing notes.   HISTORY  Chief Complaint Respiratory distress   HPI Justin Reid is a 39 y.o. male brought to the ED via EMS from home with a chief complaint of respiratory distress.  Patient has a history of asthma and hypertension, seen in the ED 10/1 and diagnosed with COVID-19 pneumonia.  He was referred to the antibody clinic but has not yet received his antibody infusion.  Reports fever and increased shortness of breath this evening.  EMS reports room air saturations 88%.  Patient given DuoNeb and EMS reports marginal blood pressure with SBP in the 90s.  Patient endorses fever, cough, shortness of breath.  Denies chest pain, abdominal pain, nausea, vomiting or diarrhea.      Past Medical History:  Diagnosis Date  . Arthritis   . Asthma   . Hypertension     Patient Active Problem List   Diagnosis Date Noted  . Acute hypoxemic respiratory failure due to COVID-19 (HCC) 10/07/2019  . Sepsis (HCC) 12/24/2017  . Asthma 07/07/2016  . Asthma exacerbation 02/20/2016    No past surgical history on file.  Prior to Admission medications   Medication Sig Start Date End Date Taking? Authorizing Provider  albuterol (VENTOLIN HFA) 108 (90 Base) MCG/ACT inhaler Inhale 2 puffs into the lungs every 4 (four) hours as needed for wheezing or shortness of breath.   Yes [provider]    Allergies Patient has no known allergies.  Family History  Problem Relation Age of Onset  . Hypertension Father     Social History Social History   Tobacco Use  . Smoking status: Current Every Day Smoker    Packs/day: 0.25    Years: 5.00    Pack years: 1.25    Types: Cigarettes  . Smokeless tobacco: Never Used  Vaping Use  . Vaping Use:  Never used  Substance Use Topics  . Alcohol use: Yes  . Drug use: No    Review of Systems  Constitutional: Positive for fever Eyes: No visual changes. ENT: No sore throat. Cardiovascular: Denies chest pain. Respiratory: Positive for cough and shortness of breath. Gastrointestinal: No abdominal pain.  No nausea, no vomiting.  No diarrhea.  No constipation. Genitourinary: Negative for dysuria. Musculoskeletal: Negative for back pain. Skin: Negative for rash. Neurological: Negative for headaches, focal weakness or numbness.   ____________________________________________   PHYSICAL EXAM:  VITAL SIGNS: ED Triage Vitals  Enc Vitals Group     BP      Pulse      Resp      Temp      Temp src      SpO2      Weight      Height      Head Circumference      Peak Flow      Pain Score      Pain Loc      Pain Edu?      Excl. in GC?     Constitutional: Alert and oriented.  Ill appearing and in moderate acute distress. Eyes: Conjunctivae are normal. PERRL. EOMI. Head: Atraumatic. Nose: No congestion/rhinnorhea. Mouth/Throat: Mucous membranes are mildly dry.   Neck: No stridor.   Cardiovascular: Normal rate, regular rhythm. Grossly normal heart sounds.  Good peripheral circulation. Respiratory: Increased respiratory  effort.  No retractions. Lungs with scattered wheezing. Gastrointestinal: Soft and nontender. No distention. No abdominal bruits. No CVA tenderness. Musculoskeletal: No lower extremity tenderness nor edema.  No joint effusions. Neurologic:  Normal speech and language. No gross focal neurologic deficits are appreciated.  Skin:  Skin is warm, dry and intact. No rash noted. Psychiatric: Mood and affect are normal. Speech and behavior are normal.  ____________________________________________   LABS (all labs ordered are listed, but only abnormal results are displayed)  Labs Reviewed  CBC WITH DIFFERENTIAL/PLATELET - Abnormal; Notable for the following components:        Result Value   Hemoglobin 12.0 (*)    HCT 35.8 (*)    MCV 79.9 (*)    All other components within normal limits  COMPREHENSIVE METABOLIC PANEL - Abnormal; Notable for the following components:   Sodium 130 (*)    Potassium 2.8 (*)    Chloride 93 (*)    Glucose, Bld 121 (*)    Calcium 8.4 (*)    AST 53 (*)    All other components within normal limits  FIBRIN DERIVATIVES D-DIMER ([facility] ONLY) - Abnormal; Notable for the following components:   Fibrin derivatives D-dimer ([facility]) 598.98 (*)    All other components within normal limits  FERRITIN - Abnormal; Notable for the following components:   Ferritin 970 (*)    All other components within normal limits  FIBRINOGEN - Abnormal; Notable for the following components:   Fibrinogen 607 (*)    All other components within normal limits  LACTATE DEHYDROGENASE - Abnormal; Notable for the following components:   LDH 252 (*)    All other components within normal limits  CULTURE, BLOOD (ROUTINE X 2)  CULTURE, BLOOD (ROUTINE X 2)  EXPECTORATED SPUTUM ASSESSMENT W REFEX TO RESP CULTURE  BRAIN NATRIURETIC PEPTIDE  PROCALCITONIN  LACTIC ACID, PLASMA  HIV ANTIBODY (ROUTINE TESTING W REFLEX)  C-REACTIVE PROTEIN  HEPATITIS B SURFACE ANTIGEN  TYPE AND SCREEN  TROPONIN I (HIGH SENSITIVITY)  TROPONIN I (HIGH SENSITIVITY)   ____________________________________________  EKG  None  ____________________________________________  RADIOLOGY I, Adama Ivins J, personally viewed and evaluated these images (plain radiographs) as part of my medical decision making, as well as reviewing the written report by the radiologist.  ED MD interpretation:   multifocal pneumonia  Official radiology report(s): DG Chest Port 1 View  Result Date: 10/07/2019 CLINICAL DATA:  Shortness of breath and COVID EXAM: PORTABLE CHEST 1 VIEW COMPARISON:  None. FINDINGS: The heart size and mediastinal contours are within normal limits. Interval slight worsening in the  multifocal patchy airspace opacities seen throughout mid to lower lungs. No pleural effusion is. No acute osseous abnormality. Rounded loose bodies are seen adjacent to the subcoracoid recess. IMPRESSION: Interval slight worsening in the multifocal airspace opacities, likely consistent with multifocal pneumonia. Electronically Signed   By: Jonna Clark M.D.   On: 10/07/2019 01:41    ____________________________________________   PROCEDURES  Procedure(s) performed (including Critical Care):  .1-3 Lead EKG Interpretation Performed by: Irean Hong, MD Authorized by: Irean Hong, MD     CRITICAL CARE Performed by: Irean Hong   Total critical care time: 30 minutes  Critical care time was exclusive of separately billable procedures and treating other patients.  Critical care was necessary to treat or prevent imminent or life-threatening deterioration.  Critical care was time spent personally by me on the following activities: development of treatment plan with patient and/or surrogate as well as nursing, discussions with consultants,  evaluation of patient's response to treatment, examination of patient, obtaining history from patient or surrogate, ordering and performing treatments and interventions, ordering and review of laboratory studies, ordering and review of radiographic studies, pulse oximetry and re-evaluation of patient's condition.    ____________________________________________   INITIAL IMPRESSION / ASSESSMENT AND PLAN / ED COURSE  As part of my medical decision making, I reviewed the following data within the electronic MEDICAL RECORD NUMBER Nursing notes reviewed and incorporated, Labs reviewed, EKG interpreted, Old chart reviewed, Radiograph reviewed, Discussed with admitting physician and Notes from prior ED visits     39 year old male with asthma presenting in respiratory distress likely secondary to Covid pneumonia. Differential includes, but is not limited to, viral  syndrome, bronchitis including COPD exacerbation, pneumonia, reactive airway disease including asthma, CHF including exacerbation with or without pulmonary/interstitial edema, pneumothorax, ACS, thoracic trauma, and pulmonary embolism.  Patient on 3 L nasal cannula oxygen with sats of 97%.  Will obtain basic labs, chest x-ray.  Administer IV Solu-Medrol and remdesivir. Anticipate admission.   Clinical Course as of Oct 06 325  Wynelle Link Oct 07, 2019  0128 Patient received 975 mg Tylenol by EMS   [JS]  0246 Discussed with hospital services for admission.  Patient resting.  Oral potassium administered.   [JS]    Clinical Course User Index [JS] Irean Hong, MD     ____________________________________________   FINAL CLINICAL IMPRESSION(S) / ED DIAGNOSES  Final diagnoses:  Pneumonia due to COVID-19 virus  Hypoxia  Respiratory distress  Moderate asthma, unspecified whether complicated, unspecified whether persistent  Fever, unspecified fever cause  Hyponatremia  Hypokalemia     ED Discharge Orders    None      *Please note:  GAIL CREEKMORE was evaluated in Emergency Department on 10/07/2019 for the symptoms described in the history of present illness. He was evaluated in the context of the global COVID-19 pandemic, which necessitated consideration that the patient might be at risk for infection with the SARS-CoV-2 virus that causes COVID-19. Institutional protocols and algorithms that pertain to the evaluation of patients at risk for COVID-19 are in a state of rapid change based on information released by regulatory bodies including the CDC and federal and state organizations. These policies and algorithms were followed during the patient's care in the ED.  Some ED evaluations and interventions may be delayed as a result of limited staffing during and the pandemic.*   Note:  This document was prepared using Dragon voice recognition software and may include unintentional dictation  errors.   Irean Hong, MD 10/07/19 [phone]

## 2019-10-07 NOTE — Progress Notes (Signed)
Patient is c/o 10/10 pain I am giving tylenol not sure that will touch it he is c/o back pain. Messaged MD about same, new orders for Morphine and Roxicodone.

## 2019-10-07 NOTE — Plan of Care (Signed)

## 2019-10-07 NOTE — Progress Notes (Signed)
Called patients mother Mervyn Gay and gave her an update.

## 2019-10-07 NOTE — ED Triage Notes (Signed)
PT BIB EMS with covid-19. Pt was here last week and was diagnosed with covid-19 pneumonia. Pt states Monday was the worse day. EMS states they were giving pt a breathing treatment and pt became dizzy and his blood pressure went to 90/60 and oxygen dropped to 89% on room air. Pt was placed on 2LPM via [place] and oxygen went up to 95%. Pt got 975mg  of tylenol by EMS.

## 2019-10-07 NOTE — Progress Notes (Signed)
Remdesivir - Pharmacy Brief Note   A/P:  Remdesivir 200 mg IVPB once followed by 100 mg IVPB daily x 4 days.   Valrie Hart, PharmD Clinical Pharmacist  10/07/2019 1:11 AM

## 2019-10-07 NOTE — H&P (Addendum)
[name]   PATIENT NAME: Justin Reid    MR#:  [id number]  DATE OF BIRTH:  1980-10-02  DATE OF ADMISSION:  10/07/2019  PRIMARY CARE PHYSICIAN: Patient, No Pcp Per   REQUESTING/REFERRING PHYSICIAN: Chiquita Loth, MD CHIEF COMPLAINT:   Chief Complaint  Patient presents with  . Shortness of Breath    HISTORY OF PRESENT ILLNESS:  Justin Reid  is a 39 y.o. African-American male with a known history of asthma, hypertension and osteoarthritis, who presented to the emergency room with acute onset of worsening dyspnea with associated dry cough and wheezing since last week. He admitted to associated nausea and vomiting as well as diarrhea. Is been having body aches as well as fever and chills and generalized fatigue and tiredness. He admitted to loss of taste and smell as well as rhinorrhea and nasal congestion.  Upon presentation to the emergency room, temperature was 103.1 and pulse oximetry was 88% on room air and 97% on 2 L of O2 by nasal cannula with otherwise normal vital signs. Labs revealed hypokalemia of 2.8 with sodium of 130 and chloride of 93. BNP was 30 and LDH was 252 and high-sensitivity troponin I of 16. Lactic acid was 1.9 and ferritin 970. Was less than 0.1 and CBC showed anemia with fibrinogen of 607 and fibrin derivatives 598.98. Blood cultures were obtained. Portable chest x-ray showedInterval slight worsening in the multifocal airspace opacities likely consistent with multifocal pneumonia. EKG is currently pending.  The patient was given IV Solu-Medrol as well as IV remdesivir, 4 mg IV Zofran 4 mg IV morphine sulfate and 40 mEq p.o. potassium chloride. The patient will be admitted to an isolated medical bed for further evaluation and management.  PAST MEDICAL HISTORY:   Past Medical History:  Diagnosis Date  . Arthritis   . Asthma   . Hypertension   -Tobacco abuse  PAST SURGICAL HISTORY:  No past surgical history on file. He denies any previous  surgeries.  SOCIAL HISTORY:   Social History   Tobacco Use  . Smoking status: Current Every Day Smoker    Packs/day: 0.25    Years: 5.00    Pack years: 1.25    Types: Cigarettes  . Smokeless tobacco: Never Used  Substance Use Topics  . Alcohol use: Yes    FAMILY HISTORY:   Family History  Problem Relation Age of Onset  . Hypertension Father     DRUG ALLERGIES:  No Known Allergies  REVIEW OF SYSTEMS:   ROS As per history of present illness. All pertinent systems were reviewed above. Constitutional, HEENT, cardiovascular, respiratory, GI, GU, musculoskeletal, neuro, psychiatric, endocrine, integumentary and hematologic systems were reviewed and are otherwise negative/unremarkable except for positive findings mentioned above in the HPI.   MEDICATIONS AT HOME:   Prior to Admission medications   Not on File      VITAL SIGNS:  Blood pressure 125/87, pulse 82, temperature (!) 101.9 F (38.8 C), temperature source Oral, resp. rate 18, height 6' (1.829 m), weight 136.1 kg, SpO2 95 %.  PHYSICAL EXAMINATION:  Physical Exam  GENERAL:  39 y.o.-year-old African-American male patient lying in the bed with mild respiratory distress with conversational dyspnea. EYES: Pupils equal, round, reactive to light and accommodation. No scleral icterus. Extraocular muscles intact.  HEENT: Head atraumatic, normocephalic. Oropharynx and nasopharynx clear.  NECK:  Supple, no jugular venous distention. No thyroid enlargement, no tenderness.  LUNGS: Diminished bibasal breath sounds with bibasal crackles. CARDIOVASCULAR: Regular rate and rhythm,  S1, S2 normal. No murmurs, rubs, or gallops.  ABDOMEN: Soft, nondistended, nontender. Bowel sounds present. No organomegaly or mass.  EXTREMITIES: No pedal edema, cyanosis, or clubbing.  NEUROLOGIC: Cranial nerves II through XII are intact. Muscle strength 5/5 in all extremities. Sensation intact. Gait not checked.  PSYCHIATRIC: The patient is alert  and oriented x 3.  Normal affect and good eye contact. SKIN: No obvious rash, lesion, or ulcer.   LABORATORY PANEL:   CBC Recent Labs  Lab 10/07/19 0109  WBC 8.3  HGB 12.0*  HCT 35.8*  PLT 291   ------------------------------------------------------------------------------------------------------------------  Chemistries  Recent Labs  Lab 10/07/19 0109  NA 130*  K 2.8*  CL 93*  CO2 29  GLUCOSE 121*  BUN 16  CREATININE 1.13  CALCIUM 8.4*  AST 53*  ALT 25  ALKPHOS 38  BILITOT 0.5   ------------------------------------------------------------------------------------------------------------------  Cardiac Enzymes No results for input(s): TROPONINI in the last 168 hours. ------------------------------------------------------------------------------------------------------------------  RADIOLOGY:  DG Chest 2 View  Result Date: 10/05/2019 CLINICAL DATA:  Cough and shortness of breath. EXAM: CHEST - 2 VIEW COMPARISON:  March 07, 2018 FINDINGS: There is patchy airspace opacity in the mid and lower lung regions without consolidation. Heart size and pulmonary vascularity are normal. No adenopathy. No bone lesions. IMPRESSION: Patchy airspace opacity bilaterally. Suspect atypical organism pneumonia. Advise check of COVID-19 status. Heart size normal.  No adenopathy. Electronically Signed   By: Bretta Bang III M.D.   On: 10/05/2019 08:38   DG Chest Port 1 View  Result Date: 10/07/2019 CLINICAL DATA:  Shortness of breath and COVID EXAM: PORTABLE CHEST 1 VIEW COMPARISON:  None. FINDINGS: The heart size and mediastinal contours are within normal limits. Interval slight worsening in the multifocal patchy airspace opacities seen throughout mid to lower lungs. No pleural effusion is. No acute osseous abnormality. Rounded loose bodies are seen adjacent to the subcoracoid recess. IMPRESSION: Interval slight worsening in the multifocal airspace opacities, likely consistent with  multifocal pneumonia. Electronically Signed   By: Jonna Clark M.D.   On: 10/07/2019 01:41      IMPRESSION AND PLAN:   1.  Acute hypoxemic respiratory failure secondary to COVID-19. -The patient will be admitted to a medically monitored isolation bed. -O2 protocol will be followed to keep O2 saturation above 93.   2.  Multifocal pneumonia secondary to COVID-19. -The patient will be admitted to an isolation monitored bed with droplet and contact precautions. -Given multifocal pneumonia we will empirically place the patient on IV Rocephin and Zithromax for possible bacterial superinfection only with elevated Procalcitonin. -The patient will be placed on scheduled Mucinex and as needed Tussionex. -We will avoid nebulization as much as we can, give bronchodilator MDI if needed, and with deterioration of oxygenation try to avoid BiPAP/CPAP if possible.    -Will obtain sputum Gram stain culture and sensitivity and follow blood cultures. -O2 protocol will be followed. -We will follow CRP, ferritin, LDH and D-dimer. -Will follow manual differential for ANC/ALC ratio as well as follow troponin I and daily CBC with manual differential and CMP. - Will place the patient on IV Remdesivir and IV steroid therapy with IV Solu-Medrol with elevated inflammatory markers. -The patient will be placed on vitamin D3, vitamin C, zinc sulfate, p.o. Pepcid and aspirin. -I discussed Baricitinib and the patient agreed to proceed with it.  3. Hypokalemia. -We will replace potassium and check magnesium level.  4. Asthma without acute exacerbation. -The patient will be placed on as needed albuterol MDI.  5. Essential hypertension. -This is apparently diet managed. -She will be placed on as needed IV labetalol.  5. DVT prophylaxis. -Subcutaneous Lovenox.    All the records are reviewed and case discussed with ED provider. The plan of care was discussed in details with the patient (and family). I answered  all questions. The patient agreed to proceed with the above mentioned plan. Further management will depend upon hospital course.   CODE STATUS: Full code  Status is: Inpatient  Remains inpatient appropriate because:Ongoing diagnostic testing needed not appropriate for outpatient work up, Unsafe d/c plan, IV treatments appropriate due to intensity of illness or inability to take PO and Inpatient level of care appropriate due to severity of illness   Dispo: The patient is from: Home              Anticipated d/c is to: Home              Anticipated d/c date is: > 3 days              Patient currently is not medically stable to d/c.   TOTAL TIME TAKING CARE OF THIS PATIENT: 55 minutes.    Hannah Beat M.D on 10/07/2019 at 3:11 AM  [facility]   From 7 PM-7 AM, contact night-coverage [email or web address]  CC: Primary care physician; Patient, No Pcp Per

## 2019-10-08 DIAGNOSIS — E875 Hyperkalemia: Secondary | ICD-10-CM

## 2019-10-08 DIAGNOSIS — J9601 Acute respiratory failure with hypoxia: Secondary | ICD-10-CM

## 2019-10-08 LAB — CBC WITH DIFFERENTIAL/PLATELET
Abs Immature Granulocytes: 0.03 10*3/uL (ref 0.00–0.07)
Basophils Absolute: 0 10*3/uL (ref 0.0–0.1)
Basophils Relative: 0 %
Eosinophils Absolute: 0 10*3/uL (ref 0.0–0.5)
Eosinophils Relative: 0 %
HCT: 37.9 % — ABNORMAL LOW (ref 39.0–52.0)
Hemoglobin: 12.2 g/dL — ABNORMAL LOW (ref 13.0–17.0)
Immature Granulocytes: 1 %
Lymphocytes Relative: 13 %
Lymphs Abs: 0.7 10*3/uL (ref 0.7–4.0)
MCH: 27.2 pg (ref 26.0–34.0)
MCHC: 32.2 g/dL (ref 30.0–36.0)
MCV: 84.6 fL (ref 80.0–100.0)
Monocytes Absolute: 0.2 10*3/uL (ref 0.1–1.0)
Monocytes Relative: 4 %
Neutro Abs: 4.6 10*3/uL (ref 1.7–7.7)
Neutrophils Relative %: 82 %
Platelets: 317 10*3/uL (ref 150–400)
RBC: 4.48 MIL/uL (ref 4.22–5.81)
RDW: 15 % (ref 11.5–15.5)
WBC: 5.6 10*3/uL (ref 4.0–10.5)
nRBC: 0 % (ref 0.0–0.2)

## 2019-10-08 LAB — COMPREHENSIVE METABOLIC PANEL
ALT: 25 U/L (ref 0–44)
AST: 33 U/L (ref 15–41)
Albumin: 3.5 g/dL (ref 3.5–5.0)
Alkaline Phosphatase: 34 U/L — ABNORMAL LOW (ref 38–126)
Anion gap: 9 (ref 5–15)
BUN: 12 mg/dL (ref 6–20)
CO2: 27 mmol/L (ref 22–32)
Calcium: 8.8 mg/dL — ABNORMAL LOW (ref 8.9–10.3)
Chloride: 101 mmol/L (ref 98–111)
Creatinine, Ser: 0.75 mg/dL (ref 0.61–1.24)
GFR calc Af Amer: >60 mL/min (ref >60)
GFR calc non Af Amer: >60 mL/min (ref >60)
Glucose, Bld: 151 mg/dL — ABNORMAL HIGH (ref 70–99)
Potassium: 5.3 mmol/L — ABNORMAL HIGH (ref 3.5–5.1)
Sodium: 137 mmol/L (ref 135–145)
Total Bilirubin: 0.4 mg/dL (ref 0.3–1.2)
Total Protein: 7.5 g/dL (ref 6.5–8.1)

## 2019-10-08 LAB — MAGNESIUM: Magnesium: 2.6 mg/dL — ABNORMAL HIGH (ref 1.7–2.4)

## 2019-10-08 LAB — FIBRIN DERIVATIVES D-DIMER (ARMC ONLY): Fibrin derivatives D-dimer (ARMC): 495.64 ng/mL (FEU) (ref 0.00–499.00)

## 2019-10-08 LAB — GLUCOSE, CAPILLARY: Glucose-Capillary: 166 mg/dL — ABNORMAL HIGH (ref 70–99)

## 2019-10-08 LAB — FERRITIN: Ferritin: 1111 ng/mL — ABNORMAL HIGH (ref 24–336)

## 2019-10-08 LAB — C-REACTIVE PROTEIN: CRP: 5.7 mg/dL — ABNORMAL HIGH (ref <1.0)

## 2019-10-08 MED ORDER — OXYCODONE HCL 5 MG PO TABS
5.0000 mg | ORAL_TABLET | Freq: Four times a day (QID) | ORAL | Status: DC | PRN
  Administered 2019-10-09 – 2019-10-11 (×4): 5 mg via ORAL
  Filled 2019-10-08 (×4): qty 1

## 2019-10-08 NOTE — Plan of Care (Signed)

## 2019-10-08 NOTE — Progress Notes (Signed)
Pt Sp02 93% on room air at rest Pt Sp02 85% on room air while ambulating around nurses station Pt Sp02 90% on 2L [place] while ambulating       Pt AAox4, VS stable, up ad lib, No complaints today, Minimal appetite. Received IV remdesivir. IS initial instructions provided with good effort. Will continue to monitor.

## 2019-10-08 NOTE — Progress Notes (Signed)
Patient ID: Justin Reid, male   DOB: 06-Jun-1980, 39 y.o.   MRN: [id number] [facility] PROGRESS NOTE  AUDWIN SEMPER QIO:[id number] DOB: 11-16-1980 DOA: 10/07/2019 PCP: Patient, No Pcp Per  HPI/Subjective: Patient tired this morning fell back asleep after I woke him up.  He was able to talk after I woke him up a second time.  He did not sleep that well last night.  Still with some cough and shortness of breath but feels like he is breathing a little bit better.  Back pain is much better than yesterday.  Objective: Vitals:   10/08/19 0812 10/08/19 1151  BP: 122/81 119/86  Pulse: 72 91  Resp: 14 16  Temp: 98.2 F (36.8 C) 97.8 F (36.6 C)  SpO2: 95% 90%    Intake/Output Summary (Last 24 hours) at 10/08/2019 1355 Last data filed at 10/07/2019 1627 Gross per 24 hour  Intake 192.04 ml  Output --  Net 192.04 ml   Filed Weights   10/07/19 0125 10/07/19 0334  Weight: 136.1 kg 132 kg    ROS: Review of Systems  Respiratory: Positive for cough and shortness of breath.   Cardiovascular: Negative for chest pain.  Gastrointestinal: Negative for abdominal pain, nausea and vomiting.   Exam: Physical Exam HENT:     Head: Normocephalic.     Mouth/Throat:     Pharynx: No oropharyngeal exudate.  Eyes:     General: Lids are normal.     Conjunctiva/sclera: Conjunctivae normal.  Cardiovascular:     Rate and Rhythm: Normal rate and regular rhythm.     Heart sounds: Normal heart sounds, S1 normal and S2 normal.  Pulmonary:     Breath sounds: Examination of the right-middle field reveals wheezing. Examination of the left-middle field reveals wheezing. Examination of the right-lower field reveals decreased breath sounds and wheezing. Examination of the left-lower field reveals decreased breath sounds and wheezing. Decreased breath sounds and wheezing present. No rhonchi or rales.  Abdominal:     Palpations: Abdomen is soft.     Tenderness: There is no abdominal tenderness.   Musculoskeletal:     Right ankle: No swelling.     Left ankle: No swelling.  Skin:    General: Skin is warm.     Findings: No rash.  Neurological:     Mental Status: He is alert and oriented to person, place, and time.       Data Reviewed: Basic Metabolic Panel: Recent Labs  Lab 10/07/19 0109 10/07/19 0449 10/08/19 0447  NA 130*  --  137  K 2.8*  --  5.3*  CL 93*  --  101  CO2 29  --  27  GLUCOSE 121*  --  151*  BUN 16  --  12  CREATININE 1.13  --  0.75  CALCIUM 8.4*  --  8.8*  MG  --  2.1 2.6*   Liver Function Tests: Recent Labs  Lab 10/07/19 0109 10/08/19 0447  AST 53* 33  ALT 25 25  ALKPHOS 38 34*  BILITOT 0.5 0.4  PROT 7.3 7.5  ALBUMIN 3.5 3.5   CBC: Recent Labs  Lab 10/07/19 0109 10/08/19 0447  WBC 8.3 5.6  NEUTROABS 7.2 4.6  HGB 12.0* 12.2*  HCT 35.8* 37.9*  MCV 79.9* 84.6  PLT 291 317   BNP (last 3 results) Recent Labs    10/07/19 0109  BNP 30.0    CBG: Recent Labs  Lab 10/08/19 0734  GLUCAP 166*    Recent Results (  from the past 240 hour(s))  Respiratory Panel by RT PCR (Flu A&B, Covid) - Nasopharyngeal Swab     Status: Abnormal   Collection Time: 10/05/19  9:07 AM   Specimen: Nasopharyngeal Swab  Result Value Ref Range Status   SARS Coronavirus 2 by RT PCR POSITIVE (A) NEGATIVE Final    Comment: RESULT CALLED TO, READ BACK BY AND VERIFIED WITH: LINDA MCLAMB AT 1025 ON 10/05/2019 [facility]. (NOTE) SARS-CoV-2 target nucleic acids are DETECTED.  SARS-CoV-2 RNA is generally detectable in upper respiratory specimens  during the acute phase of infection. Positive results are indicative of the presence of the identified virus, but do not rule out bacterial infection or co-infection with other pathogens not detected by the test. Clinical correlation with patient history and other diagnostic information is necessary to determine patient infection status. The expected result is Negative.  Fact Sheet for Patients:   https://www.moore.com/https://www.fda.gov/media/142436/download  Fact Sheet for Healthcare Providers: https://www.young.biz/https://www.fda.gov/media/142435/download  This test is not yet approved or cleared by the [place] FDA and  has been authorized for detection and/or diagnosis of SARS-CoV-2 by FDA under an Emergency Use Authorization (EUA).  This EUA will remain in effect (meaning this test can  be used) for the duration of  the COVID-19 declaration under Section 564(b)(1) of the Act, 21 U.S.C. section 360bbb-3(b)(1), unless the authorization is terminated or revoked sooner.      Influenza A by PCR NEGATIVE NEGATIVE Final   Influenza B by PCR NEGATIVE NEGATIVE Final    Comment: (NOTE) The Xpert Xpress SARS-CoV-2/FLU/RSV assay is intended as an aid in  the diagnosis of influenza from Nasopharyngeal swab specimens and  should not be used as a sole basis for treatment. Nasal washings and  aspirates are unacceptable for Xpert Xpress SARS-CoV-2/FLU/RSV  testing.  Fact Sheet for Patients: https://www.moore.com/https://www.fda.gov/media/142436/download  Fact Sheet for Healthcare Providers: https://www.young.biz/https://www.fda.gov/media/142435/download  This test is not yet approved or cleared by the [place] FDA and  has been authorized for detection and/or diagnosis of SARS-CoV-2 by  FDA under an Emergency Use Authorization (EUA). This EUA will remain  in effect (meaning this test can be used) for the duration of the  Covid-19 declaration under Section 564(b)(1) of the Act, 21  U.S.C. section 360bbb-3(b)(1), unless the authorization is  terminated or revoked. Performed at [facility], [address] [place], [place] [address]   Culture, blood (Routine X 2) w Reflex to ID Panel     Status: None (Preliminary result)   Collection Time: 10/07/19  1:10 AM   Specimen: BLOOD  Result Value Ref Range Status   Specimen Description BLOOD LEFT ANTECUBITAL  Final   Special Requests   Final    BOTTLES DRAWN AEROBIC AND ANAEROBIC Blood Culture  adequate volume   Culture   Final    NO GROWTH 1 DAY Performed at [facility], [address] [place], [place] [address]    Report Status PENDING  Incomplete  Culture, blood (Routine X 2) w Reflex to ID Panel     Status: None (Preliminary result)   Collection Time: 10/07/19  1:10 AM   Specimen: BLOOD  Result Value Ref Range Status   Specimen Description BLOOD BLOOD RIGHT FOREARM  Final   Special Requests   Final    BOTTLES DRAWN AEROBIC AND ANAEROBIC Blood Culture adequate volume   Culture   Final    NO GROWTH 1 DAY Performed at [facility], [address] [place], [place] [address]    Report  Status PENDING  Incomplete     Studies: DG Thoracic Spine 2 View  Result Date: 10/07/2019 CLINICAL DATA:  Patient c/o upper back pain that he has had since before being admitted. Worse since being admitted., EXAM: THORACIC SPINE 2 VIEWS COMPARISON:  Chest radiograph 10/05/2019 FINDINGS: Normal alignment. Vertebral body heights appear maintained. Multilevel mild degenerative disc disease. No definite focal lesion. Regional soft tissues are unremarkable. IMPRESSION: No acute finding radiographically in the thoracic spine. If symptoms persist would recommend cross-sectional imaging. Electronically Signed   By: Emmaline Kluver M.D.   On: 10/07/2019 16:43   DG Chest Port 1 View  Result Date: 10/07/2019 CLINICAL DATA:  Shortness of breath and COVID EXAM: PORTABLE CHEST 1 VIEW COMPARISON:  None. FINDINGS: The heart size and mediastinal contours are within normal limits. Interval slight worsening in the multifocal patchy airspace opacities seen throughout mid to lower lungs. No pleural effusion is. No acute osseous abnormality. Rounded loose bodies are seen adjacent to the subcoracoid recess. IMPRESSION: Interval slight worsening in the multifocal airspace opacities, likely consistent with multifocal pneumonia. Electronically Signed   By: Jonna Clark M.D.   On: 10/07/2019 01:41     Scheduled Meds: . vitamin C  500 mg Oral Daily  . aspirin EC  81 mg Oral Daily  . baricitinib  4 mg Oral Daily  . cholecalciferol  1,000 Units Oral Daily  . enoxaparin (LOVENOX) injection  40 mg Subcutaneous Q12H  . famotidine  20 mg Oral BID  . guaiFENesin  600 mg Oral BID  . mouth rinse  15 mL Mouth Rinse BID  . methylPREDNISolone (SOLU-MEDROL) injection  125 mg Intravenous Q12H   Followed by  . [START ON 10/10/2019] predniSONE  50 mg Oral Daily  . zinc sulfate  220 mg Oral Daily   Continuous Infusions: . remdesivir 100 mg in NS 100 mL 100 mg (10/08/19 0850)    Assessment/Plan:  1. Acute hypoxic respiratory failure secondary to COVID-19 pneumonia.  Patient able to come off oxygen at rest today but dropped saturations down to 85% with ambulating.  Reassess pulse ox with ambulating tomorrow. 2. COVID-19 pneumonia.  Wheezing heard more anteriorly.  Continue Solu-Medrol.  Admitting physician started baricitinib.  Remdesivir day 2 today. 3. Severe hypokalemia on presentation.  Over replaced and now hyperkalemic.  Discontinue potassium supplementation and recheck tomorrow. 4. Hyponatremia.  Sodium now in the normal range. 5. Mid back pain improved with steroids.  Code Status:     Code Status Orders  (From admission, onward)         Start     Ordered   10/07/19 0303  Full code  Continuous        10/07/19 0311        Code Status History    Date Active Date Inactive Code Status Order ID Comments User Context   12/24/2017 2303 12/26/2017 1612 Full Code [id number]  Bertrum Sol, MD Inpatient   07/06/2016 2042 07/08/2016 1424 Full Code [id number]  Shaune Pollack, MD Inpatient   02/20/2016 0632 02/23/2016 1521 Full Code [id number]  Hugelmeyer, Jon Gills, DO Inpatient   Advance Care Planning Activity     Family Communication: Updated patient's wife on the phone Disposition Plan: Status is: Inpatient  Dispo: The patient is from: Home              Anticipated d/c is to: Home               Anticipated d/c date is: Would like to  get the patient completely off oxygen prior to disposition.              Patient currently receiving IV steroids, day 2 of remdesivir for COVID-19 pneumonia and acute hypoxic respiratory failure.  Time spent: 27 minutes  Kimberlyann [employer]

## 2019-10-09 DIAGNOSIS — E876 Hypokalemia: Secondary | ICD-10-CM

## 2019-10-09 DIAGNOSIS — E871 Hypo-osmolality and hyponatremia: Secondary | ICD-10-CM

## 2019-10-09 DIAGNOSIS — M546 Pain in thoracic spine: Secondary | ICD-10-CM

## 2019-10-09 DIAGNOSIS — J1282 Pneumonia due to coronavirus disease 2019: Secondary | ICD-10-CM

## 2019-10-09 DIAGNOSIS — U071 COVID-19: Principal | ICD-10-CM

## 2019-10-09 LAB — CBC WITH DIFFERENTIAL/PLATELET
Abs Immature Granulocytes: 0.12 10*3/uL — ABNORMAL HIGH (ref 0.00–0.07)
Basophils Absolute: 0 10*3/uL (ref 0.0–0.1)
Basophils Relative: 0 %
Eosinophils Absolute: 0 10*3/uL (ref 0.0–0.5)
Eosinophils Relative: 0 %
HCT: 38.5 % — ABNORMAL LOW (ref 39.0–52.0)
Hemoglobin: 12.3 g/dL — ABNORMAL LOW (ref 13.0–17.0)
Immature Granulocytes: 1 %
Lymphocytes Relative: 9 %
Lymphs Abs: 0.9 10*3/uL (ref 0.7–4.0)
MCH: 26.9 pg (ref 26.0–34.0)
MCHC: 31.9 g/dL (ref 30.0–36.0)
MCV: 84.2 fL (ref 80.0–100.0)
Monocytes Absolute: 0.4 10*3/uL (ref 0.1–1.0)
Monocytes Relative: 4 %
Neutro Abs: 8.9 10*3/uL — ABNORMAL HIGH (ref 1.7–7.7)
Neutrophils Relative %: 86 %
Platelets: 390 10*3/uL (ref 150–400)
RBC: 4.57 MIL/uL (ref 4.22–5.81)
RDW: 14.8 % (ref 11.5–15.5)
WBC: 10.4 10*3/uL (ref 4.0–10.5)
nRBC: 0.2 % (ref 0.0–0.2)

## 2019-10-09 LAB — COMPREHENSIVE METABOLIC PANEL
ALT: 47 U/L — ABNORMAL HIGH (ref 0–44)
AST: 82 U/L — ABNORMAL HIGH (ref 15–41)
Albumin: 3.4 g/dL — ABNORMAL LOW (ref 3.5–5.0)
Alkaline Phosphatase: 44 U/L (ref 38–126)
Anion gap: 12 (ref 5–15)
BUN: 17 mg/dL (ref 6–20)
CO2: 26 mmol/L (ref 22–32)
Calcium: 8.8 mg/dL — ABNORMAL LOW (ref 8.9–10.3)
Chloride: 98 mmol/L (ref 98–111)
Creatinine, Ser: 0.92 mg/dL (ref 0.61–1.24)
GFR calc Af Amer: >60 mL/min (ref >60)
GFR calc non Af Amer: >60 mL/min (ref >60)
Glucose, Bld: 144 mg/dL — ABNORMAL HIGH (ref 70–99)
Potassium: 4.4 mmol/L (ref 3.5–5.1)
Sodium: 136 mmol/L (ref 135–145)
Total Bilirubin: 0.5 mg/dL (ref 0.3–1.2)
Total Protein: 7.3 g/dL (ref 6.5–8.1)

## 2019-10-09 LAB — C-REACTIVE PROTEIN: CRP: 2.7 mg/dL — ABNORMAL HIGH (ref <1.0)

## 2019-10-09 LAB — MAGNESIUM: Magnesium: 2.7 mg/dL — ABNORMAL HIGH (ref 1.7–2.4)

## 2019-10-09 LAB — GLUCOSE, CAPILLARY: Glucose-Capillary: 136 mg/dL — ABNORMAL HIGH (ref 70–99)

## 2019-10-09 LAB — FERRITIN: Ferritin: 1550 ng/mL — ABNORMAL HIGH (ref 24–336)

## 2019-10-09 LAB — FIBRIN DERIVATIVES D-DIMER (ARMC ONLY): Fibrin derivatives D-dimer (ARMC): 385.45 ng/mL (FEU) (ref 0.00–499.00)

## 2019-10-09 MED ORDER — ALBUTEROL SULFATE HFA 108 (90 BASE) MCG/ACT IN AERS
2.0000 | INHALATION_SPRAY | Freq: Four times a day (QID) | RESPIRATORY_TRACT | Status: DC
  Administered 2019-10-09 – 2019-10-11 (×8): 2 via RESPIRATORY_TRACT
  Filled 2019-10-09 (×2): qty 6.7

## 2019-10-09 NOTE — Progress Notes (Signed)
Patient ID: Justin Reid, male   DOB: [birth year], 39 y.o.   MRN: [id number] [facility] PROGRESS NOTE  Justin Reid YKD:[id number] DOB: 19-Sep-1980 DOA: 10/07/2019 PCP: Patient, No Pcp Per  HPI/Subjective: Patient feeling better today. No complaints of back pain. Still has some shortness of breath and cough. Admitted with COVID-19 pneumonia and hypoxia.  Objective: Vitals:   10/09/19 0735 10/09/19 1113  BP: (!) 136/95 130/78  Pulse: 84 74  Resp: 19 20  Temp: 98.2 F (36.8 C) 98.6 F (37 C)  SpO2: 92% 95%   No intake or output data in the 24 hours ending 10/09/19 1343 Filed Weights   10/07/19 0125 10/07/19 0334  Weight: 136.1 kg 132 kg    ROS: Review of Systems  Respiratory: Positive for cough and shortness of breath.   Cardiovascular: Negative for chest pain.  Gastrointestinal: Negative for abdominal pain, nausea and vomiting.   Exam: Physical Exam HENT:     Mouth/Throat:     Pharynx: No oropharyngeal exudate.  Eyes:     General: Lids are normal.     Conjunctiva/sclera: Conjunctivae normal.     Pupils: Pupils are equal, round, and reactive to light.  Cardiovascular:     Rate and Rhythm: Normal rate and regular rhythm.     Heart sounds: Normal heart sounds, S1 normal and S2 normal.  Pulmonary:     Breath sounds: Examination of the right-middle field reveals decreased breath sounds. Examination of the left-middle field reveals decreased breath sounds. Examination of the right-lower field reveals decreased breath sounds. Examination of the left-lower field reveals decreased breath sounds. Decreased breath sounds present. No wheezing, rhonchi or rales.  Abdominal:     Palpations: Abdomen is soft.     Tenderness: There is no abdominal tenderness.  Musculoskeletal:     Right lower leg: No swelling.     Left lower leg: No swelling.  Skin:    General: Skin is warm.     Findings: No rash.  Neurological:     Mental Status: He is alert and oriented to person,  place, and time.       Data Reviewed: Basic Metabolic Panel: Recent Labs  Lab 10/07/19 0109 10/07/19 0449 10/08/19 0447 10/09/19 0430  NA 130*  --  137 136  K 2.8*  --  5.3* 4.4  CL 93*  --  101 98  CO2 29  --  27 26  GLUCOSE 121*  --  151* 144*  BUN 16  --  12 17  CREATININE 1.13  --  0.75 0.92  CALCIUM 8.4*  --  8.8* 8.8*  MG  --  2.1 2.6* 2.7*   Liver Function Tests: Recent Labs  Lab 10/07/19 0109 10/08/19 0447 10/09/19 0430  AST 53* 33 82*  ALT 25 25 47*  ALKPHOS 38 34* 44  BILITOT 0.5 0.4 0.5  PROT 7.3 7.5 7.3  ALBUMIN 3.5 3.5 3.4*   CBC: Recent Labs  Lab 10/07/19 0109 10/08/19 0447 10/09/19 0430  WBC 8.3 5.6 10.4  NEUTROABS 7.2 4.6 8.9*  HGB 12.0* 12.2* 12.3*  HCT 35.8* 37.9* 38.5*  MCV 79.9* 84.6 84.2  PLT 291 317 390   BNP (last 3 results) Recent Labs    10/07/19 0109  BNP 30.0     CBG: Recent Labs  Lab 10/08/19 0734  GLUCAP 166*    Recent Results (from the past 240 hour(s))  Respiratory Panel by RT PCR (Flu A&B, Covid) - Nasopharyngeal Swab     Status:  Abnormal   Collection Time: 10/05/19  9:07 AM   Specimen: Nasopharyngeal Swab  Result Value Ref Range Status   SARS Coronavirus 2 by RT PCR POSITIVE (A) NEGATIVE Final    Comment: RESULT CALLED TO, READ BACK BY AND VERIFIED WITH: LINDA MCLAMB AT 1025 ON 10/05/2019 [facility]. (NOTE) SARS-CoV-2 target nucleic acids are DETECTED.  SARS-CoV-2 RNA is generally detectable in upper respiratory specimens  during the acute phase of infection. Positive results are indicative of the presence of the identified virus, but do not rule out bacterial infection or co-infection with other pathogens not detected by the test. Clinical correlation with patient history and other diagnostic information is necessary to determine patient infection status. The expected result is Negative.  Fact Sheet for Patients:  https://www.moore.com/  Fact Sheet for Healthcare  Providers: https://www.young.biz/  This test is not yet approved or cleared by the [place] FDA and  has been authorized for detection and/or diagnosis of SARS-CoV-2 by FDA under an Emergency Use Authorization (EUA).  This EUA will remain in effect (meaning this test can  be used) for the duration of  the COVID-19 declaration under Section 564(b)(1) of the Act, 21 U.S.C. section 360bbb-3(b)(1), unless the authorization is terminated or revoked sooner.      Influenza A by PCR NEGATIVE NEGATIVE Final   Influenza B by PCR NEGATIVE NEGATIVE Final    Comment: (NOTE) The Xpert Xpress SARS-CoV-2/FLU/RSV assay is intended as an aid in  the diagnosis of influenza from Nasopharyngeal swab specimens and  should not be used as a sole basis for treatment. Nasal washings and  aspirates are unacceptable for Xpert Xpress SARS-CoV-2/FLU/RSV  testing.  Fact Sheet for Patients: https://www.moore.com/  Fact Sheet for Healthcare Providers: https://www.young.biz/  This test is not yet approved or cleared by the [place] FDA and  has been authorized for detection and/or diagnosis of SARS-CoV-2 by  FDA under an Emergency Use Authorization (EUA). This EUA will remain  in effect (meaning this test can be used) for the duration of the  Covid-19 declaration under Section 564(b)(1) of the Act, 21  U.S.C. section 360bbb-3(b)(1), unless the authorization is  terminated or revoked. Performed at [facility], [address] [place], [place] [address]   Culture, blood (Routine X 2) w Reflex to ID Panel     Status: None (Preliminary result)   Collection Time: 10/07/19  1:10 AM   Specimen: BLOOD  Result Value Ref Range Status   Specimen Description BLOOD LEFT ANTECUBITAL  Final   Special Requests   Final    BOTTLES DRAWN AEROBIC AND ANAEROBIC Blood Culture adequate volume   Culture   Final    NO GROWTH 2 DAYS Performed at  [facility], [address], [place], [place] [address]    Report Status PENDING  Incomplete  Culture, blood (Routine X 2) w Reflex to ID Panel     Status: None (Preliminary result)   Collection Time: 10/07/19  1:10 AM   Specimen: BLOOD  Result Value Ref Range Status   Specimen Description BLOOD BLOOD RIGHT FOREARM  Final   Special Requests   Final    BOTTLES DRAWN AEROBIC AND ANAEROBIC Blood Culture adequate volume   Culture   Final    NO GROWTH 2 DAYS Performed at [facility], [address], [place], [place] [address]    Report Status PENDING  Incomplete     Studies: DG Thoracic Spine 2 View  Result Date: 10/07/2019 CLINICAL DATA:  Patient  c/o upper back pain that he has had since before being admitted. Worse since being admitted., EXAM: THORACIC SPINE 2 VIEWS COMPARISON:  Chest radiograph 10/05/2019 FINDINGS: Normal alignment. Vertebral body heights appear maintained. Multilevel mild degenerative disc disease. No definite focal lesion. Regional soft tissues are unremarkable. IMPRESSION: No acute finding radiographically in the thoracic spine. If symptoms persist would recommend cross-sectional imaging. Electronically Signed   By: Emmaline Kluver M.D.   On: 10/07/2019 16:43    Scheduled Meds: . vitamin C  500 mg Oral Daily  . aspirin EC  81 mg Oral Daily  . baricitinib  4 mg Oral Daily  . cholecalciferol  1,000 Units Oral Daily  . enoxaparin (LOVENOX) injection  40 mg Subcutaneous Q12H  . famotidine  20 mg Oral BID  . guaiFENesin  600 mg Oral BID  . mouth rinse  15 mL Mouth Rinse BID  . methylPREDNISolone (SOLU-MEDROL) injection  125 mg Intravenous Q12H   Followed by  . [START ON 10/10/2019] predniSONE  50 mg Oral Daily  . zinc sulfate  220 mg Oral Daily   Continuous Infusions: . remdesivir 100 mg in NS 100 mL 100 mg (10/09/19 0932)    Assessment/Plan:  1. Acute hypoxic respiratory failure secondary to COVID-19 pneumonia. I walked around with the  patient on room air and his pulse ox did drop down to 88% today. Recheck pulse ox with ambulation tomorrow morning. 2. COVID-19 pneumonia. Remdesivir day 3 today. Admitting physician started baricitinib. Continue Solu-Medrol. Albuterol inhaler. 3. Severe hypokalemia on presentation. Potassium in the normal range. 4. Hyponatremia on presentation. Sodium in the normal range. 5. Mid back pain improved with steroids.  Code Status:     Code Status Orders  (From admission, onward)         Start     Ordered   10/07/19 0303  Full code  Continuous        10/07/19 0311        Code Status History    Date Active Date Inactive Code Status Order ID Comments User Context   12/24/2017 2303 12/26/2017 1612 Full Code [id number]  Bertrum Sol, MD Inpatient   07/06/2016 2042 07/08/2016 1424 Full Code [id number]  Shaune Pollack, MD Inpatient   02/20/2016 0632 02/23/2016 1521 Full Code [id number]  Hugelmeyer, Jon Gills, DO Inpatient   Advance Care Planning Activity     Family Communication: Spoke with wife on the phone Disposition Plan: Status is: Inpatient  Dispo: The patient is from: Home              Anticipated d/c is to: Home              Anticipated d/c date is: Potential 10/10/2019 versus 10/11/2019.              Patient currently being treated for acute hypoxic respiratory failure and COVID-19 pneumonia. Trying to get off oxygen prior to disposition.  Time spent: 28 minutes  [employer]

## 2019-10-09 NOTE — Progress Notes (Signed)
SATURATION QUALIFICATIONS: (This note is used to comply with regulatory documentation for home oxygen)  Patient Saturations on Room Air at Rest = 90%  Patient Saturations on Room Air while Ambulating = 88%  Patient Saturations on 2 Liters of oxygen while Ambulating = 93%  Please briefly explain why patient needs home oxygen: 

## 2019-10-10 DIAGNOSIS — J9601 Acute respiratory failure with hypoxia: Secondary | ICD-10-CM

## 2019-10-10 LAB — COMPREHENSIVE METABOLIC PANEL
ALT: 45 U/L — ABNORMAL HIGH (ref 0–44)
AST: 46 U/L — ABNORMAL HIGH (ref 15–41)
Albumin: 3.2 g/dL — ABNORMAL LOW (ref 3.5–5.0)
Alkaline Phosphatase: 42 U/L (ref 38–126)
Anion gap: 10 (ref 5–15)
BUN: 16 mg/dL (ref 6–20)
CO2: 27 mmol/L (ref 22–32)
Calcium: 8.4 mg/dL — ABNORMAL LOW (ref 8.9–10.3)
Chloride: 97 mmol/L — ABNORMAL LOW (ref 98–111)
Creatinine, Ser: 0.69 mg/dL (ref 0.61–1.24)
GFR calc non Af Amer: >60 mL/min (ref >60)
Glucose, Bld: 121 mg/dL — ABNORMAL HIGH (ref 70–99)
Potassium: 4.1 mmol/L (ref 3.5–5.1)
Sodium: 134 mmol/L — ABNORMAL LOW (ref 135–145)
Total Bilirubin: 0.6 mg/dL (ref 0.3–1.2)
Total Protein: 6.9 g/dL (ref 6.5–8.1)

## 2019-10-10 LAB — CBC WITH DIFFERENTIAL/PLATELET
Abs Immature Granulocytes: 0.21 10*3/uL — ABNORMAL HIGH (ref 0.00–0.07)
Basophils Absolute: 0 10*3/uL (ref 0.0–0.1)
Basophils Relative: 0 %
Eosinophils Absolute: 0 10*3/uL (ref 0.0–0.5)
Eosinophils Relative: 0 %
HCT: 37.9 % — ABNORMAL LOW (ref 39.0–52.0)
Hemoglobin: 11.9 g/dL — ABNORMAL LOW (ref 13.0–17.0)
Immature Granulocytes: 2 %
Lymphocytes Relative: 12 %
Lymphs Abs: 1 10*3/uL (ref 0.7–4.0)
MCH: 26.3 pg (ref 26.0–34.0)
MCHC: 31.4 g/dL (ref 30.0–36.0)
MCV: 83.8 fL (ref 80.0–100.0)
Monocytes Absolute: 0.6 10*3/uL (ref 0.1–1.0)
Monocytes Relative: 7 %
Neutro Abs: 6.8 10*3/uL (ref 1.7–7.7)
Neutrophils Relative %: 79 %
Platelets: 419 10*3/uL — ABNORMAL HIGH (ref 150–400)
RBC: 4.52 MIL/uL (ref 4.22–5.81)
RDW: 14.7 % (ref 11.5–15.5)
WBC: 8.6 10*3/uL (ref 4.0–10.5)
nRBC: 0 % (ref 0.0–0.2)

## 2019-10-10 LAB — C-REACTIVE PROTEIN: CRP: 1.1 mg/dL — ABNORMAL HIGH (ref <1.0)

## 2019-10-10 LAB — FIBRIN DERIVATIVES D-DIMER (ARMC ONLY): Fibrin derivatives D-dimer (ARMC): 323.54 ng/mL (FEU) (ref 0.00–499.00)

## 2019-10-10 LAB — GLUCOSE, CAPILLARY: Glucose-Capillary: 120 mg/dL — ABNORMAL HIGH (ref 70–99)

## 2019-10-10 LAB — MAGNESIUM: Magnesium: 2.7 mg/dL — ABNORMAL HIGH (ref 1.7–2.4)

## 2019-10-10 LAB — FERRITIN: Ferritin: 1324 ng/mL — ABNORMAL HIGH (ref 24–336)

## 2019-10-10 LAB — TROPONIN I (HIGH SENSITIVITY)
Troponin I (High Sensitivity): 8 ng/L (ref <18)
Troponin I (High Sensitivity): 7 ng/L (ref <18)

## 2019-10-10 MED ORDER — NITROGLYCERIN 0.4 MG SL SUBL
0.4000 mg | SUBLINGUAL_TABLET | SUBLINGUAL | Status: DC | PRN
  Administered 2019-10-10 (×2): 0.4 mg via SUBLINGUAL
  Filled 2019-10-10: qty 1

## 2019-10-10 MED ORDER — MECLIZINE HCL 25 MG PO TABS
25.0000 mg | ORAL_TABLET | Freq: Once | ORAL | Status: AC
  Administered 2019-10-10: 22:00:00 25 mg via ORAL
  Filled 2019-10-10: qty 1

## 2019-10-10 NOTE — Progress Notes (Signed)
Called patients mother Mervyn Gay and gave update.

## 2019-10-10 NOTE — Progress Notes (Addendum)
Went in room to get patient to ambulate. Patients blood pressure was 135/103. Patient stated he was sleeping and he was awaken by the feeling of his heart stopping and restarted, he also said that he was having 8/10 chest pain and shortness of breath. Patients O2 sats were 97% RA and felt dizziness. Messaged MD, new order for EKG, Troponin, and Nitro. EKG complete. Gave sublingual Nitro x2, stopping the chest pain to 0/10. Patient said he still felt short of breath, placed him on O2 2LPM via [place] for comfort. Patient was lying in bed and grabbed his chest and said that his heart was going fast. His heart rate went from low 60s to 99 quickly and then slowed back down, patient was not doing any movement to cause this increase. Will continue to monitor.

## 2019-10-10 NOTE — Progress Notes (Signed)
PROGRESS NOTE    Justin Reid  YTK:[id number] DOB: 24-Feb-1980 DOA: 10/07/2019 PCP: Patient, No Pcp Per   Chief Complain:  Brief Narrative:  Patient is a 39 year old male with history of asthma, hypertension, osteoarthritis who presented to the emergency department complaints of dyspnea, dry cough, wheezing.  On presentation he was febrile.  He was hypoxic on room air requiring 2 L of oxygen per minute.  His inflammatory markers were elevated.  Chest imaging showed multifocal airspace opacities consistent with multifocal pneumonia.  Covid screening test came out to be positive.  He was started on remdesivir and steroids.  Assessment & Plan:   Active Problems:   Pneumonia due to COVID-19 virus   Hypokalemia   Hyponatremia   Acute midline thoracic back pain   Acute respiratory failure with hypoxia (HCC)   Hyperkalemia  1. Acute hypoxic respiratory failure: secondary to COVID-19 pneumonia.  This morning he was on room air. 2. COVID-19 pneumonia:. Remdesivir day 4 today. Also started on baricitinib.  Steroids has been changed to oral.  Continue bronchodilators as needed  3. severe hypokalemia : Supplemented and corrected 4. Hyponatremia: Resolved  5. mid back pain: improved with steroids. 6. Hypertension: Does not take any medication at home.  Diastolic blood pressure has been noted to be elevated.  We will continue to monitor and start on antihypertensives on discharge if needed. 7. Chest pain: New problem today.  Developed this afternoon.  Will check EKG and troponin.  On as needed nitroglycerin 8. Moderate obesity: BMI 39.4          DVT prophylaxis:Lovenox Code Status: Full Family Communication: Discussed with wife on phone at bedside Status is: Inpatient  Remains inpatient appropriate because:Inpatient level of care appropriate due to severity of illness   Dispo: The patient is from: Home              Anticipated d/c is to: Home              Anticipated d/c date is:  1 day              Patient currently is not medically stable to d/c.  He complains of chest pain today.   remdesivir last dose tomorrow.  Anticipate discharge tomorrow if remains stable.   Consultants: None  Procedures: None  Antimicrobials:  Anti-infectives (From admission, onward)   Start     Dose/Rate Route Frequency Ordered Stop   10/08/19 1000  remdesivir 100 mg in sodium chloride 0.9 % 100 mL IVPB       "Followed by" Linked Group Details   100 mg 200 mL/hr over 30 Minutes Intravenous Daily 10/07/19 0107 10/12/19 0959   10/08/19 1000  remdesivir 100 mg in sodium chloride 0.9 % 100 mL IVPB  Status:  Discontinued       "Followed by" Linked Group Details   100 mg 200 mL/hr over 30 Minutes Intravenous Daily 10/07/19 0311 10/07/19 0313   10/07/19 0330  remdesivir 200 mg in sodium chloride 0.9% 250 mL IVPB       "Followed by" Linked Group Details   200 mg 580 mL/hr over 30 Minutes Intravenous Once 10/07/19 0107 10/07/19 0311   10/07/19 0315  remdesivir 200 mg in sodium chloride 0.9% 250 mL IVPB  Status:  Discontinued       "Followed by" Linked Group Details   200 mg 580 mL/hr over 30 Minutes Intravenous Once 10/07/19 0311 10/07/19 0313      Subjective: Patient seen and examined at  the bedside this morning.  Hemodynamically stable, comfortable and was on room air during my evaluation and was very eager to go home.  Later this afternoon I was notified that nurse that he developed chest pain.  Objective: Vitals:   10/09/19 2031 10/10/19 0458 10/10/19 0735 10/10/19 1146  BP: (!) 140/92 (!) 133/99 137/88 (!) 135/103  Pulse: 65 67 73 60  Resp: 18 18 20 18   Temp: 98.4 F (36.9 C) 97.7 F (36.5 C) 97.8 F (36.6 C) 97.6 F (36.4 C)  TempSrc: Oral Oral Oral Oral  SpO2: 90% 91% 93% 93%  Weight:      Height:        Intake/Output Summary (Last 24 hours) at 10/10/2019 1215 Last data filed at 10/09/2019 1514 Gross per 24 hour  Intake 600 ml  Output --  Net 600 ml   Filed  Weights   10/07/19 0125 10/07/19 0334  Weight: 136.1 kg 132 kg    Examination:  General exam: Appears calm and comfortable ,Not in distress, morbidly obese HEENT:PERRL,Oral mucosa moist, Ear/Nose normal on gross exam Respiratory system: Bilateral equal air entry, normal vesicular breath sounds, no wheezes or crackles  Cardiovascular system: S1 & S2 heard, RRR. No JVD, murmurs, rubs, gallops or clicks. No pedal edema. Gastrointestinal system: Abdomen is nondistended, soft and nontender. No organomegaly or masses felt. Normal bowel sounds heard. Central nervous system: Alert and oriented. No focal neurological deficits. Extremities: No edema, no clubbing ,no cyanosis, distal peripheral pulses palpable. Skin: No rashes, lesions or ulcers,no icterus ,no pallor   Data Reviewed: I have personally reviewed following labs and imaging studies  CBC: Recent Labs  Lab 10/07/19 0109 10/08/19 0447 10/09/19 0430 10/10/19 0520  WBC 8.3 5.6 10.4 8.6  NEUTROABS 7.2 4.6 8.9* 6.8  HGB 12.0* 12.2* 12.3* 11.9*  HCT 35.8* 37.9* 38.5* 37.9*  MCV 79.9* 84.6 84.2 83.8  PLT 291 317 390 419*   Basic Metabolic Panel: Recent Labs  Lab 10/07/19 0109 10/07/19 0449 10/08/19 0447 10/09/19 0430 10/10/19 0520  NA 130*  --  137 136 134*  K 2.8*  --  5.3* 4.4 4.1  CL 93*  --  101 98 97*  CO2 29  --  27 26 27   GLUCOSE 121*  --  151* 144* 121*  BUN 16  --  12 17 16   CREATININE 1.13  --  0.75 0.92 0.69  CALCIUM 8.4*  --  8.8* 8.8* 8.4*  MG  --  2.1 2.6* 2.7* 2.7*   GFR: Estimated Creatinine Clearance: 177.7 mL/min (by C-G formula based on SCr of 0.69 mg/dL). Liver Function Tests: Recent Labs  Lab 10/07/19 0109 10/08/19 0447 10/09/19 0430 10/10/19 0520  AST 53* 33 82* 46*  ALT 25 25 47* 45*  ALKPHOS 38 34* 44 42  BILITOT 0.5 0.4 0.5 0.6  PROT 7.3 7.5 7.3 6.9  ALBUMIN 3.5 3.5 3.4* 3.2*   No results for input(s): LIPASE, AMYLASE in the last 168 hours. No results for input(s): AMMONIA in the  last 168 hours. Coagulation Profile: No results for input(s): INR, PROTIME in the last 168 hours. Cardiac Enzymes: No results for input(s): CKTOTAL, CKMB, CKMBINDEX, TROPONINI in the last 168 hours. BNP (last 3 results) No results for input(s): PROBNP in the last 8760 hours. HbA1C: No results for input(s): HGBA1C in the last 72 hours. CBG: Recent Labs  Lab 10/08/19 0734 10/09/19 2029 10/10/19 0739  GLUCAP 166* 136* 120*   Lipid Profile: No results for input(s): CHOL, HDL, LDLCALC,  TRIG, CHOLHDL, LDLDIRECT in the last 72 hours. Thyroid Function Tests: No results for input(s): TSH, T4TOTAL, FREET4, T3FREE, THYROIDAB in the last 72 hours. Anemia Panel: Recent Labs    10/09/19 0430 10/10/19 0520  FERRITIN 1,550* 1,324*   Sepsis Labs: Recent Labs  Lab 10/07/19 0109 10/07/19 0113  PROCALCITON <0.10  --   LATICACIDVEN  --  1.9    Recent Results (from the past 240 hour(s))  Respiratory Panel by RT PCR (Flu A&B, Covid) - Nasopharyngeal Swab     Status: Abnormal   Collection Time: 10/05/19  9:07 AM   Specimen: Nasopharyngeal Swab  Result Value Ref Range Status   SARS Coronavirus 2 by RT PCR POSITIVE (A) NEGATIVE Final    Comment: RESULT CALLED TO, READ BACK BY AND VERIFIED WITH: LINDA MCLAMB AT 1025 ON 10/05/2019 [facility]. (NOTE) SARS-CoV-2 target nucleic acids are DETECTED.  SARS-CoV-2 RNA is generally detectable in upper respiratory specimens  during the acute phase of infection. Positive results are indicative of the presence of the identified virus, but do not rule out bacterial infection or co-infection with other pathogens not detected by the test. Clinical correlation with patient history and other diagnostic information is necessary to determine patient infection status. The expected result is Negative.  Fact Sheet for Patients:  https://www.moore.com/https://www.fda.gov/media/142436/download  Fact Sheet for Healthcare Providers: https://www.young.biz/https://www.fda.gov/media/142435/download  This test is  not yet approved or cleared by the [place] FDA and  has been authorized for detection and/or diagnosis of SARS-CoV-2 by FDA under an Emergency Use Authorization (EUA).  This EUA will remain in effect (meaning this test can  be used) for the duration of  the COVID-19 declaration under Section 564(b)(1) of the Act, 21 U.S.C. section 360bbb-3(b)(1), unless the authorization is terminated or revoked sooner.      Influenza A by PCR NEGATIVE NEGATIVE Final   Influenza B by PCR NEGATIVE NEGATIVE Final    Comment: (NOTE) The Xpert Xpress SARS-CoV-2/FLU/RSV assay is intended as an aid in  the diagnosis of influenza from Nasopharyngeal swab specimens and  should not be used as a sole basis for treatment. Nasal washings and  aspirates are unacceptable for Xpert Xpress SARS-CoV-2/FLU/RSV  testing.  Fact Sheet for Patients: https://www.moore.com/https://www.fda.gov/media/142436/download  Fact Sheet for Healthcare Providers: https://www.young.biz/https://www.fda.gov/media/142435/download  This test is not yet approved or cleared by the [place] FDA and  has been authorized for detection and/or diagnosis of SARS-CoV-2 by  FDA under an Emergency Use Authorization (EUA). This EUA will remain  in effect (meaning this test can be used) for the duration of the  Covid-19 declaration under Section 564(b)(1) of the Act, 21  U.S.C. section 360bbb-3(b)(1), unless the authorization is  terminated or revoked. Performed at [facility], [address] [place], [place] [address]   Culture, blood (Routine X 2) w Reflex to ID Panel     Status: None (Preliminary result)   Collection Time: 10/07/19  1:10 AM   Specimen: BLOOD  Result Value Ref Range Status   Specimen Description BLOOD LEFT ANTECUBITAL  Final   Special Requests   Final    BOTTLES DRAWN AEROBIC AND ANAEROBIC Blood Culture adequate volume   Culture   Final    NO GROWTH 3 DAYS Performed at [facility], [address] [place], [place] [address]     Report Status PENDING  Incomplete  Culture, blood (Routine X 2) w Reflex to ID Panel     Status: None (Preliminary result)   Collection Time: 10/07/19  1:10  AM   Specimen: BLOOD  Result Value Ref Range Status   Specimen Description BLOOD BLOOD RIGHT FOREARM  Final   Special Requests   Final    BOTTLES DRAWN AEROBIC AND ANAEROBIC Blood Culture adequate volume   Culture   Final    NO GROWTH 3 DAYS Performed at [facility], [address] [place], [place] [address]    Report Status PENDING  Incomplete         Radiology Studies: No results found.      Scheduled Meds: . albuterol  2 puff Inhalation QID  . vitamin C  500 mg Oral Daily  . aspirin EC  81 mg Oral Daily  . baricitinib  4 mg Oral Daily  . cholecalciferol  1,000 Units Oral Daily  . enoxaparin (LOVENOX) injection  40 mg Subcutaneous Q12H  . famotidine  20 mg Oral BID  . guaiFENesin  600 mg Oral BID  . mouth rinse  15 mL Mouth Rinse BID  . predniSONE  50 mg Oral Daily  . zinc sulfate  220 mg Oral Daily   Continuous Infusions: . remdesivir 100 mg in NS 100 mL 100 mg (10/10/19 0943)     LOS: 3 days    Time spent: More than 50% of that time was spent in counseling and/or coordination of care.      Burnadette Pop, MD [facility] P10/06/2019, 12:15 PM

## 2019-10-10 NOTE — Plan of Care (Signed)
  Problem: Education: Goal: Knowledge of risk factors and measures for prevention of condition will improve Outcome: Progressing   

## 2019-10-11 ENCOUNTER — Telehealth: Payer: Self-pay | Admitting: Gerontology

## 2019-10-11 LAB — COMPREHENSIVE METABOLIC PANEL
ALT: 72 U/L — ABNORMAL HIGH (ref 0–44)
AST: 72 U/L — ABNORMAL HIGH (ref 15–41)
Albumin: 3.2 g/dL — ABNORMAL LOW (ref 3.5–5.0)
Alkaline Phosphatase: 48 U/L (ref 38–126)
Anion gap: 7 (ref 5–15)
BUN: 18 mg/dL (ref 6–20)
CO2: 31 mmol/L (ref 22–32)
Calcium: 8.1 mg/dL — ABNORMAL LOW (ref 8.9–10.3)
Chloride: 99 mmol/L (ref 98–111)
Creatinine, Ser: 0.86 mg/dL (ref 0.61–1.24)
GFR calc non Af Amer: >60 mL/min (ref >60)
Glucose, Bld: 106 mg/dL — ABNORMAL HIGH (ref 70–99)
Potassium: 3.5 mmol/L (ref 3.5–5.1)
Sodium: 137 mmol/L (ref 135–145)
Total Bilirubin: 0.6 mg/dL (ref 0.3–1.2)
Total Protein: 7 g/dL (ref 6.5–8.1)

## 2019-10-11 LAB — CBC WITH DIFFERENTIAL/PLATELET
Abs Immature Granulocytes: 0.39 10*3/uL — ABNORMAL HIGH (ref 0.00–0.07)
Basophils Absolute: 0 10*3/uL (ref 0.0–0.1)
Basophils Relative: 0 %
Eosinophils Absolute: 0 10*3/uL (ref 0.0–0.5)
Eosinophils Relative: 0 %
HCT: 40.8 % (ref 39.0–52.0)
Hemoglobin: 13 g/dL (ref 13.0–17.0)
Immature Granulocytes: 5 %
Lymphocytes Relative: 25 %
Lymphs Abs: 2.1 10*3/uL (ref 0.7–4.0)
MCH: 26.7 pg (ref 26.0–34.0)
MCHC: 31.9 g/dL (ref 30.0–36.0)
MCV: 84 fL (ref 80.0–100.0)
Monocytes Absolute: 0.6 10*3/uL (ref 0.1–1.0)
Monocytes Relative: 7 %
Neutro Abs: 5.3 10*3/uL (ref 1.7–7.7)
Neutrophils Relative %: 63 %
Platelets: 505 10*3/uL — ABNORMAL HIGH (ref 150–400)
RBC: 4.86 MIL/uL (ref 4.22–5.81)
RDW: 14.6 % (ref 11.5–15.5)
WBC: 8.4 10*3/uL (ref 4.0–10.5)
nRBC: 0.4 % — ABNORMAL HIGH (ref 0.0–0.2)

## 2019-10-11 LAB — MAGNESIUM: Magnesium: 2.8 mg/dL — ABNORMAL HIGH (ref 1.7–2.4)

## 2019-10-11 LAB — FIBRIN DERIVATIVES D-DIMER (ARMC ONLY): Fibrin derivatives D-dimer (ARMC): 365.55 ng/mL (FEU) (ref 0.00–499.00)

## 2019-10-11 LAB — FERRITIN: Ferritin: 917 ng/mL — ABNORMAL HIGH (ref 24–336)

## 2019-10-11 MED ORDER — ASCORBIC ACID 500 MG PO TABS: 500.0000 mg | ORAL_TABLET | Freq: Every day | ORAL | 0 refills | Status: DC

## 2019-10-11 MED ORDER — AMLODIPINE BESYLATE 10 MG PO TABS: 10.0000 mg | ORAL_TABLET | Freq: Every day | ORAL | 1 refills | Status: DC

## 2019-10-11 MED ORDER — PREDNISONE 10 MG PO TABS
10.0000 mg | ORAL_TABLET | Freq: Every day | ORAL | 0 refills | Status: DC
Start: 2019-10-11 — End: 2019-10-11

## 2019-10-11 MED ORDER — ZINC SULFATE 220 (50 ZN) MG PO CAPS: 220.0000 mg | ORAL_CAPSULE | Freq: Every day | ORAL | 0 refills | Status: DC

## 2019-10-11 MED ORDER — GUAIFENESIN-DM 100-10 MG/5ML PO SYRP: 1.0000 mL | ORAL_SOLUTION | ORAL | 1 refills | Status: DC | PRN

## 2019-10-11 MED ORDER — PREDNISONE 10 MG PO TABS
10.0000 mg | ORAL_TABLET | Freq: Every day | ORAL | 0 refills | Status: DC
Start: 2019-10-11 — End: 2020-05-08

## 2019-10-11 MED ORDER — ZINC SULFATE 220 (50 ZN) MG PO CAPS
220.0000 mg | ORAL_CAPSULE | Freq: Every day | ORAL | 0 refills | Status: DC
Start: 2019-10-12 — End: 2020-11-12

## 2019-10-11 NOTE — Discharge Summary (Addendum)
Physician Discharge Summary  KAYCE BETTY ZOX:[id number] DOB: 17-Mar-1980 DOA: 10/07/2019  PCP: Patient, No Pcp Per  Admit date: 10/07/2019 Discharge date: 10/11/2019  Admitted From: Home Disposition:  Home  Discharge Condition:Stable CODE STATUS:FULL Diet recommendation: Heart Healthy  Brief/Interim Summary:  Patient is a 39 year old male with history of asthma, hypertension, osteoarthritis who presented to the emergency department complaints of dyspnea, dry cough, wheezing.  On presentation he was febrile.  He was hypoxic on room air requiring 2 L of oxygen per minute.  His inflammatory markers were elevated.  Chest imaging showed multifocal airspace opacities consistent with multifocal pneumonia.  Covid screening test came out to be positive.  He was started on remdesivir and steroids.  He has completed a course of remdesivir.  Currently he is on room air.  He is hemodynamically stable for discharge home on tapering dose of oral prednisone.   Following problems were addressed during his hospitalization:   1. Acute hypoxic respiratory failure: secondary to COVID-19 pneumonia. He is maintaining saturation on room air. 2. COVID-19 pneumonia:. Remdesivir day 5today.  He was also started on baricitinib.  Steroids has been changed to oral.  Continue home medications, tapering prednisone, albuterol inhaler at home.   3. Severe hypokalemia : Supplemented and corrected 4. Hyponatremia: Resolved  5. mid back pain: improved with steroids. 6. Hypertension: Does not take any medication at home.  Diastolic blood pressure has been noted to be elevated.  Continue amlodipine on discharge  7. chest pain: Resolved .  Troponins negative 8. Moderate obesity: BMI 39.4   Discharge Diagnoses:  Active Problems:   Pneumonia due to COVID-19 virus   Hypokalemia   Hyponatremia   Acute midline thoracic back pain   Acute respiratory failure with hypoxia (HCC)   Hyperkalemia    Discharge  Instructions  Discharge Instructions    Diet - low sodium heart healthy   Complete by: As directed    Discharge instructions   Complete by: As directed    1)Please take medications as instructed. 2)Follow up with your PCP in 1 to 2 weeks. 3)Infection Prevention Recommendations for Individuals Confirmed to have, or Being Evaluated for, 2019 Novel Coronavirus (COVID-19) Infection Who Receive Care at Home  Individuals who are confirmed to have, or are being evaluated for, COVID-19 should follow the prevention steps below until a healthcare provider or local or state health department says they can return to normal activities.  Stay home except to get medical care You should restrict activities outside your home, except for getting medical care. Do not go to work, school, or public areas, and do not use public transportation or taxis.  Call ahead before visiting your doctor Before your medical appointment, call the healthcare provider and tell them that you have, or are being evaluated for, COVID-19 infection. This will help the healthcare provider's office take steps to keep other people from getting infected. Ask your healthcare provider to call the local or state health department.  Monitor your symptoms Seek prompt medical attention if your illness is worsening (e.g., difficulty breathing). Before going to your medical appointment, call the healthcare provider and tell them that you have, or are being evaluated for, COVID-19 infection. Ask your healthcare provider to call the local or state health department.  Wear a facemask You should wear a facemask that covers your nose and mouth when you are in the same room with other people and when you visit a healthcare provider. People who live with or visit you should also  wear a facemask while they are in the same room with you.  Separate yourself from other people in your home As much as possible, you should stay in a different  room from other people in your home. Also, you should use a separate bathroom, if available.  Avoid sharing household items You should not share dishes, drinking glasses, cups, eating utensils, towels, bedding, or other items with other people in your home. After using these items, you should wash them thoroughly with soap and water.  Cover your coughs and sneezes Cover your mouth and nose with a tissue when you cough or sneeze, or you can cough or sneeze into your sleeve. Throw used tissues in a lined trash can, and immediately wash your hands with soap and water for at least 20 seconds or use an alcohol-based hand rub.  Wash your [employer] your hands often and thoroughly with soap and water for at least 20 seconds. You can use an alcohol-based hand sanitizer if soap and water are not available and if your hands are not visibly dirty. Avoid touching your eyes, nose, and mouth with unwashed hands.   Prevention Steps for Caregivers and Household Members of Individuals Confirmed to have, or Being Evaluated for, COVID-19 Infection Being Cared for in the Home  If you live with, or provide care at home for, a person confirmed to have, or being evaluated for, COVID-19 infection please follow these guidelines to prevent infection:  Follow healthcare provider's instructions Make sure that you understand and can help the patient follow any healthcare provider instructions for all care.  Provide for the patient's basic needs You should help the patient with basic needs in the home and provide support for getting groceries, prescriptions, and other personal needs.  Monitor the patient's symptoms If they are getting sicker, call his or her medical provider and tell them that the patient has, or is being evaluated for, COVID-19 infection. This will help the healthcare provider's office take steps to keep other people from getting infected. Ask the healthcare provider to call the local  or state health department.  Limit the number of people who have contact with the patient If possible, have only one caregiver for the patient. Other household members should stay in another home or place of residence. If this is not possible, they should stay in another room, or be separated from the patient as much as possible. Use a separate bathroom, if available. Restrict visitors who do not have an essential need to be in the home.  Keep older adults, very young children, and other sick people away from the patient Keep older adults, very young children, and those who have compromised immune systems or chronic health conditions away from the patient. This includes people with chronic heart, lung, or kidney conditions, diabetes, and cancer.  Ensure good ventilation Make sure that shared spaces in the home have good air flow, such as from an air conditioner or an opened window, weather permitting.  Wash your hands often Wash your hands often and thoroughly with soap and water for at least 20 seconds. You can use an alcohol based hand sanitizer if soap and water are not available and if your hands are not visibly dirty. Avoid touching your eyes, nose, and mouth with unwashed hands. Use disposable paper towels to dry your hands. If not available, use dedicated cloth towels and replace them when they become wet.  Wear a facemask and gloves Wear a disposable facemask at all times in  the room and gloves when you touch or have contact with the patient's blood, body fluids, and/or secretions or excretions, such as sweat, saliva, sputum, nasal mucus, vomit, urine, or feces. Ensure the mask fits over your nose and mouth tightly, and do not touch it during use. Throw out disposable facemasks and gloves after using them. Do not reuse. Wash your hands immediately after removing your facemask and gloves. If your personal clothing becomes contaminated, carefully remove clothing and launder. Wash  your hands after handling contaminated clothing. Place all used disposable facemasks, gloves, and other waste in a lined container before disposing them with other household waste. Remove gloves and wash your hands immediately after handling these items.  Do not share dishes, glasses, or other household items with the patient Avoid sharing household items. You should not share dishes, drinking glasses, cups, eating utensils, towels, bedding, or other items with a patient who is confirmed to have, or being evaluated for, COVID-19 infection. After the person uses these items, you should wash them thoroughly with soap and water.  Wash laundry thoroughly Immediately remove and wash clothes or bedding that have blood, body fluids, and/or secretions or excretions, such as sweat, saliva, sputum, nasal mucus, vomit, urine, or feces, on them. Wear gloves when handling laundry from the patient. Read and follow directions on labels of laundry or clothing items and detergent. In general, wash and dry with the warmest temperatures recommended on the label.  Clean all areas the individual has used often Clean all touchable surfaces, such as counters, tabletops, doorknobs, bathroom fixtures, toilets, phones, keyboards, tablets, and bedside tables, every day. Also, clean any surfaces that may have blood, body fluids, and/or secretions or excretions on them. Wear gloves when cleaning surfaces the patient has come in contact with. Use a diluted bleach solution (e.g., dilute bleach with 1 part bleach and 10 parts water) or a household disinfectant with a label that says EPA-registered for coronaviruses. To make a bleach solution at home, add 1 tablespoon of bleach to 1 quart (4 cups) of water. For a larger supply, add  cup of bleach to 1 gallon (16 cups) of water. Read labels of cleaning products and follow recommendations provided on product labels. Labels contain instructions for safe and effective use of the  cleaning product including precautions you should take when applying the product, such as wearing gloves or eye protection and making sure you have good ventilation during use of the product. Remove gloves and wash hands immediately after cleaning.  Monitor yourself for signs and symptoms of illness Caregivers and household members are considered close contacts, should monitor their health, and will be asked to limit movement outside of the home to the extent possible. Follow the monitoring steps for close contacts listed on the symptom monitoring form.   Increase activity slowly   Complete by: As directed      Allergies as of 10/11/2019   No Known Allergies     Medication List    TAKE these medications   albuterol 108 (90 Base) MCG/ACT inhaler Commonly known as: VENTOLIN HFA Inhale 2 puffs into the lungs every 4 (four) hours as needed for wheezing or shortness of breath.   amLODipine 10 MG tablet Commonly known as: NORVASC Take 1 tablet (10 mg total) by mouth daily.   ascorbic acid 500 MG tablet Commonly known as: VITAMIN C Take 1 tablet (500 mg total) by mouth daily. Start taking on: October 12, 2019   guaiFENesin-dextromethorphan 100-10 MG/5ML syrup Commonly known  as: ROBITUSSIN DM Take 1 mL by mouth every 4 (four) hours as needed for cough.   predniSONE 10 MG tablet Commonly known as: DELTASONE Take 1 tablet (10 mg total) by mouth daily with breakfast.   zinc sulfate 220 (50 Zn) MG capsule Take 1 capsule (220 mg total) by mouth daily. Start taking on: October 12, 2019       Follow-up Information    [facility] Go in 1 week(s).   Why: Go to the [facility] in Clinic next Thursday 10/14 - you do not need an appointment.  This will be for hospital follow up.  Contact information: [address] [place] [address] [phone]        [facility]. Call in 1 week(s).   Specialty: Primary Care Why: Referral has been sent to [facility] services.  If you do not hear from them by next Thursday please call them to schedule appointment.  Application will need to be filled out- included in DC paperwork.  Contact information: [address] [place] [address] [phone]             No Known Allergies  Consultations:  None   Procedures/Studies: DG Chest 2 View  Result Date: 10/05/2019 CLINICAL DATA:  Cough and shortness of breath. EXAM: CHEST - 2 VIEW COMPARISON:  March 07, 2018 FINDINGS: There is patchy airspace opacity in the mid and lower lung regions without consolidation. Heart size and pulmonary vascularity are normal. No adenopathy. No bone lesions. IMPRESSION: Patchy airspace opacity bilaterally. Suspect atypical organism pneumonia. Advise check of COVID-19 status. Heart size normal.  No adenopathy. Electronically Signed   By: Bretta Bang III M.D.   On: 10/05/2019 08:38   DG Thoracic Spine 2 View  Result Date: 10/07/2019 CLINICAL DATA:  Patient c/o upper back pain that he has had since before being admitted. Worse since being admitted., EXAM: THORACIC SPINE 2 VIEWS COMPARISON:  Chest radiograph 10/05/2019 FINDINGS: Normal alignment. Vertebral body heights appear maintained. Multilevel mild degenerative disc disease. No definite focal lesion. Regional soft tissues are unremarkable. IMPRESSION: No acute finding radiographically in the thoracic spine. If symptoms persist would recommend cross-sectional imaging. Electronically Signed   By: Emmaline Kluver M.D.   On: 10/07/2019 16:43   DG Chest Port 1 View  Result Date: 10/07/2019 CLINICAL DATA:  Shortness of breath and COVID EXAM: PORTABLE CHEST 1 VIEW COMPARISON:  None. FINDINGS: The heart size and mediastinal contours are within normal limits. Interval slight worsening in the multifocal patchy airspace opacities seen throughout mid to lower lungs. No pleural effusion is. No acute osseous  abnormality. Rounded loose bodies are seen adjacent to the subcoracoid recess. IMPRESSION: Interval slight worsening in the multifocal airspace opacities, likely consistent with multifocal pneumonia. Electronically Signed   By: Jonna Clark M.D.   On: 10/07/2019 01:41      Subjective:  Patient seen and examined the bedside this morning.  Hemodynamically stable for discharge today.  Discharge Exam: Vitals:   10/11/19 0759 10/11/19 1127  BP: (!) 139/108 (!) 145/107  Pulse: 70 84  Resp: 17 18  Temp: 97.7 F (36.5 C) 98.7 F (37.1 C)  SpO2: 98% 96%   Vitals:   10/10/19 2125 10/11/19 0436 10/11/19 0759 10/11/19 1127  BP: (!) 141/101 121/80 (!) 139/108 (!) 145/107  Pulse: 84 67 70 84  Resp:  16 17 18   Temp:  97.8 F (36.6 C)  97.7 F (36.5 C) 98.7 F (37.1 C)  TempSrc:  Oral Oral Oral  SpO2:  95% 98% 96%  Weight:      Height:        General: Pt is alert, awake, not in acute distress Cardiovascular: RRR, S1/S2 +, no rubs, no gallops Respiratory: CTA bilaterally, no wheezing, no rhonchi Abdominal: Soft, NT, ND, bowel sounds + Extremities: no edema, no cyanosis    The results of significant diagnostics from this hospitalization (including imaging, microbiology, ancillary and laboratory) are listed below for reference.     Microbiology: Recent Results (from the past 240 hour(s))  Respiratory Panel by RT PCR (Flu A&B, Covid) - Nasopharyngeal Swab     Status: Abnormal   Collection Time: 10/05/19  9:07 AM   Specimen: Nasopharyngeal Swab  Result Value Ref Range Status   SARS Coronavirus 2 by RT PCR POSITIVE (A) NEGATIVE Final    Comment: RESULT CALLED TO, READ BACK BY AND VERIFIED WITH: LINDA MCLAMB AT 1025 ON 10/05/2019 [facility]. (NOTE) SARS-CoV-2 target nucleic acids are DETECTED.  SARS-CoV-2 RNA is generally detectable in upper respiratory specimens  during the acute phase of infection. Positive results are indicative of the presence of the identified virus, but do not rule  out bacterial infection or co-infection with other pathogens not detected by the test. Clinical correlation with patient history and other diagnostic information is necessary to determine patient infection status. The expected result is Negative.  Fact Sheet for Patients:  https://www.moore.com/  Fact Sheet for Healthcare Providers: https://www.young.biz/  This test is not yet approved or cleared by the [place] FDA and  has been authorized for detection and/or diagnosis of SARS-CoV-2 by FDA under an Emergency Use Authorization (EUA).  This EUA will remain in effect (meaning this test can  be used) for the duration of  the COVID-19 declaration under Section 564(b)(1) of the Act, 21 U.S.C. section 360bbb-3(b)(1), unless the authorization is terminated or revoked sooner.      Influenza A by PCR NEGATIVE NEGATIVE Final   Influenza B by PCR NEGATIVE NEGATIVE Final    Comment: (NOTE) The Xpert Xpress SARS-CoV-2/FLU/RSV assay is intended as an aid in  the diagnosis of influenza from Nasopharyngeal swab specimens and  should not be used as a sole basis for treatment. Nasal washings and  aspirates are unacceptable for Xpert Xpress SARS-CoV-2/FLU/RSV  testing.  Fact Sheet for Patients: https://www.moore.com/  Fact Sheet for Healthcare Providers: https://www.young.biz/  This test is not yet approved or cleared by the [place] FDA and  has been authorized for detection and/or diagnosis of SARS-CoV-2 by  FDA under an Emergency Use Authorization (EUA). This EUA will remain  in effect (meaning this test can be used) for the duration of the  Covid-19 declaration under Section 564(b)(1) of the Act, 21  U.S.C. section 360bbb-3(b)(1), unless the authorization is  terminated or revoked. Performed at [facility], [address] [place], [place] [address]   Culture, blood (Routine X 2) w Reflex  to ID Panel     Status: None (Preliminary result)   Collection Time: 10/07/19  1:10 AM   Specimen: BLOOD  Result Value Ref Range Status   Specimen Description BLOOD LEFT ANTECUBITAL  Final   Special Requests   Final    BOTTLES DRAWN AEROBIC AND ANAEROBIC Blood Culture adequate volume   Culture   Final    NO GROWTH 4 DAYS Performed at [facility], [address], [place], [place] [address]  Report Status PENDING  Incomplete  Culture, blood (Routine X 2) w Reflex to ID Panel     Status: None (Preliminary result)   Collection Time: 10/07/19  1:10 AM   Specimen: BLOOD  Result Value Ref Range Status   Specimen Description BLOOD BLOOD RIGHT FOREARM  Final   Special Requests   Final    BOTTLES DRAWN AEROBIC AND ANAEROBIC Blood Culture adequate volume   Culture   Final    NO GROWTH 4 DAYS Performed at [facility], [address] [place], [place] [address]    Report Status PENDING  Incomplete     Labs: BNP (last 3 results) Recent Labs    10/07/19 0109  BNP 30.0   Basic Metabolic Panel: Recent Labs  Lab 10/07/19 0109 10/07/19 0449 10/08/19 0447 10/09/19 0430 10/10/19 0520 10/11/19 0457  NA 130*  --  137 136 134* 137  K 2.8*  --  5.3* 4.4 4.1 3.5  CL 93*  --  101 98 97* 99  CO2 29  --  27 26 27 31   GLUCOSE 121*  --  151* 144* 121* 106*  BUN 16  --  12 17 16 18   CREATININE 1.13  --  0.75 0.92 0.69 0.86  CALCIUM 8.4*  --  8.8* 8.8* 8.4* 8.1*  MG  --  2.1 2.6* 2.7* 2.7* 2.8*   Liver Function Tests: Recent Labs  Lab 10/07/19 0109 10/08/19 0447 10/09/19 0430 10/10/19 0520 10/11/19 0457  AST 53* 33 82* 46* 72*  ALT 25 25 47* 45* 72*  ALKPHOS 38 34* 44 42 48  BILITOT 0.5 0.4 0.5 0.6 0.6  PROT 7.3 7.5 7.3 6.9 7.0  ALBUMIN 3.5 3.5 3.4* 3.2* 3.2*   No results for input(s): LIPASE, AMYLASE in the last 168 hours. No results for input(s): AMMONIA in the last 168 hours. CBC: Recent Labs  Lab 10/07/19 0109 10/08/19 0447 10/09/19 0430  10/10/19 0520 10/11/19 0457  WBC 8.3 5.6 10.4 8.6 8.4  NEUTROABS 7.2 4.6 8.9* 6.8 5.3  HGB 12.0* 12.2* 12.3* 11.9* 13.0  HCT 35.8* 37.9* 38.5* 37.9* 40.8  MCV 79.9* 84.6 84.2 83.8 84.0  PLT 291 317 390 419* 505*   Cardiac Enzymes: No results for input(s): CKTOTAL, CKMB, CKMBINDEX, TROPONINI in the last 168 hours. BNP: Invalid input(s): POCBNP CBG: Recent Labs  Lab 10/08/19 0734 10/09/19 2029 10/10/19 0739  GLUCAP 166* 136* 120*   D-Dimer No results for input(s): DDIMER in the last 72 hours. Hgb A1c No results for input(s): HGBA1C in the last 72 hours. Lipid Profile No results for input(s): CHOL, HDL, LDLCALC, TRIG, CHOLHDL, LDLDIRECT in the last 72 hours. Thyroid function studies No results for input(s): TSH, T4TOTAL, T3FREE, THYROIDAB in the last 72 hours.  Invalid input(s): FREET3 Anemia work up Recent Labs    10/10/19 0520 10/11/19 0457  FERRITIN 1,324* 917*   Urinalysis No results found for: COLORURINE, APPEARANCEUR, LABSPEC, PHURINE, GLUCOSEU, HGBUR, BILIRUBINUR, KETONESUR, PROTEINUR, UROBILINOGEN, NITRITE, LEUKOCYTESUR Sepsis Labs Invalid input(s): PROCALCITONIN,  WBC,  LACTICIDVEN Microbiology Recent Results (from the past 240 hour(s))  Respiratory Panel by RT PCR (Flu A&B, Covid) - Nasopharyngeal Swab     Status: Abnormal   Collection Time: 10/05/19  9:07 AM   Specimen: Nasopharyngeal Swab  Result Value Ref Range Status   SARS Coronavirus 2 by RT PCR POSITIVE (A) NEGATIVE Final    Comment: RESULT CALLED TO, READ BACK BY AND VERIFIED WITH: LINDA MCLAMB AT 1025 ON 10/05/2019 [facility]. (NOTE) SARS-CoV-2 target nucleic  acids are DETECTED.  SARS-CoV-2 RNA is generally detectable in upper respiratory specimens  during the acute phase of infection. Positive results are indicative of the presence of the identified virus, but do not rule out bacterial infection or co-infection with other pathogens not detected by the test. Clinical correlation with patient history  and other diagnostic information is necessary to determine patient infection status. The expected result is Negative.  Fact Sheet for Patients:  https://www.moore.com/https://www.fda.gov/media/142436/download  Fact Sheet for Healthcare Providers: https://www.young.biz/https://www.fda.gov/media/142435/download  This test is not yet approved or cleared by the [place] FDA and  has been authorized for detection and/or diagnosis of SARS-CoV-2 by FDA under an Emergency Use Authorization (EUA).  This EUA will remain in effect (meaning this test can  be used) for the duration of  the COVID-19 declaration under Section 564(b)(1) of the Act, 21 U.S.C. section 360bbb-3(b)(1), unless the authorization is terminated or revoked sooner.      Influenza A by PCR NEGATIVE NEGATIVE Final   Influenza B by PCR NEGATIVE NEGATIVE Final    Comment: (NOTE) The Xpert Xpress SARS-CoV-2/FLU/RSV assay is intended as an aid in  the diagnosis of influenza from Nasopharyngeal swab specimens and  should not be used as a sole basis for treatment. Nasal washings and  aspirates are unacceptable for Xpert Xpress SARS-CoV-2/FLU/RSV  testing.  Fact Sheet for Patients: https://www.moore.com/https://www.fda.gov/media/142436/download  Fact Sheet for Healthcare Providers: https://www.young.biz/https://www.fda.gov/media/142435/download  This test is not yet approved or cleared by the [place] FDA and  has been authorized for detection and/or diagnosis of SARS-CoV-2 by  FDA under an Emergency Use Authorization (EUA). This EUA will remain  in effect (meaning this test can be used) for the duration of the  Covid-19 declaration under Section 564(b)(1) of the Act, 21  U.S.C. section 360bbb-3(b)(1), unless the authorization is  terminated or revoked. Performed at [facility], [address] [place], [place] [address]   Culture, blood (Routine X 2) w Reflex to ID Panel     Status: None (Preliminary result)   Collection Time: 10/07/19  1:10 AM   Specimen: BLOOD  Result Value Ref  Range Status   Specimen Description BLOOD LEFT ANTECUBITAL  Final   Special Requests   Final    BOTTLES DRAWN AEROBIC AND ANAEROBIC Blood Culture adequate volume   Culture   Final    NO GROWTH 4 DAYS Performed at [facility], [address] [place], [place] [address]    Report Status PENDING  Incomplete  Culture, blood (Routine X 2) w Reflex to ID Panel     Status: None (Preliminary result)   Collection Time: 10/07/19  1:10 AM   Specimen: BLOOD  Result Value Ref Range Status   Specimen Description BLOOD BLOOD RIGHT FOREARM  Final   Special Requests   Final    BOTTLES DRAWN AEROBIC AND ANAEROBIC Blood Culture adequate volume   Culture   Final    NO GROWTH 4 DAYS Performed at [facility], [address] [place], [place] [id number]    Report Status PENDING  Incomplete    Please note: You were cared for by a hospitalist during your hospital stay. Once you are discharged, your primary care physician will handle any further medical issues. Please note that NO REFILLS for any discharge medications will be authorized once you are discharged, as it is imperative that you return to your primary care physician (or establish a relationship with a primary care physician if you do not have one) for  your post hospital discharge needs so that they can reassess your need for medications and monitor your lab values.    Time coordinating discharge: 40 minutes  SIGNED:   Burnadette Pop, MD  [facility] 10/11/2019, 12:26 PM Pager [phone]  If 7PM-7AM, please contact night-coverage [email or web address] Password [email or web address]

## 2019-10-11 NOTE — Telephone Encounter (Signed)
Patient answered phone but was busy, told him to call back when he is free.

## 2019-10-11 NOTE — TOC Transition Note (Signed)
Transition of Care [facility]) - CM/SW Discharge Note   Patient Details  Name: Justin Reid MRN: [id number] Date of Birth: Feb 15, 1980  [facility] CM/SW Contact:  Allayne Butcher, RN Phone Number: 10/11/2019, 12:30 PM   Clinical Narrative:    Patient is medically cleared for discharge home.  Patient admitted with COVID.  Patient is getting his girlfriend to come and pick him up today at discharge.  Patient currently does not have any insurance or a primary care provider.  Referral sent to [facility] and patient provided with application.  Patient also provided with Purple Book of free and low cost healthcare options.  RNCM instructed patient for hospital follow up to go to [facility] in Clinic in 1 week.  Patient verbalizes understanding of all information reviewed.     Final next level of care: Home/Self Care Barriers to Discharge: Barriers Resolved   Patient Goals and CMS Choice Patient states their goals for this hospitalization and ongoing recovery are:: Glad to be going home      Discharge Placement                       Discharge Plan and Services In-house Referral: NA Discharge Planning Services: [facility], CM Consult, Follow-up appt scheduled              DME Agency: NA       HH Arranged: NA          Social Determinants of Health (SDOH) Interventions     Readmission Risk Interventions No flowsheet data found.

## 2019-10-11 NOTE — Progress Notes (Addendum)
SATURATION QUALIFICATIONS: (This note is used to comply with regulatory documentation for home oxygen)  Patient Saturations on Room Air at Rest = 92%  Patient Saturations on Room Air while Ambulating = 89%  Patient Saturations on 0 Liters of oxygen while Ambulating = 89%  Please briefly explain why patient needs home oxygen:

## 2019-10-11 NOTE — Progress Notes (Signed)
Removed patients IV before discharge. Went over discharge instructions and medications with patient. He had no questions about medications or his follow up appointments. Patients blood pressure has been elevated today, MD aware and he stated that he was sending patient home with Amlodipine. Educated patient on getting his prescriptions today and taking his Amlodipine today for his blood pressure, patient stated that he understood. Patient going home POV.

## 2019-10-12 ENCOUNTER — Emergency Department
Admission: EM | Admit: 2019-10-12 | Discharge: 2019-10-12 | Disposition: A | Discharge status: Home / Self Care | Payer: Self-pay | Attending: Emergency Medicine | Admitting: Emergency Medicine

## 2019-10-12 ENCOUNTER — Emergency Department: Payer: Self-pay

## 2019-10-12 ENCOUNTER — Encounter: Payer: Self-pay | Admitting: Emergency Medicine

## 2019-10-12 ENCOUNTER — Other Ambulatory Visit: Payer: Self-pay

## 2019-10-12 DIAGNOSIS — F17210 Nicotine dependence, cigarettes, uncomplicated: Secondary | ICD-10-CM | POA: Insufficient documentation

## 2019-10-12 DIAGNOSIS — Z7952 Long term (current) use of systemic steroids: Secondary | ICD-10-CM | POA: Insufficient documentation

## 2019-10-12 DIAGNOSIS — R0789 Other chest pain: Secondary | ICD-10-CM

## 2019-10-12 DIAGNOSIS — K649 Unspecified hemorrhoids: Secondary | ICD-10-CM | POA: Insufficient documentation

## 2019-10-12 DIAGNOSIS — Z79899 Other long term (current) drug therapy: Secondary | ICD-10-CM | POA: Insufficient documentation

## 2019-10-12 DIAGNOSIS — I10 Essential (primary) hypertension: Secondary | ICD-10-CM | POA: Insufficient documentation

## 2019-10-12 DIAGNOSIS — U071 COVID-19: Secondary | ICD-10-CM | POA: Insufficient documentation

## 2019-10-12 DIAGNOSIS — J45901 Unspecified asthma with (acute) exacerbation: Secondary | ICD-10-CM | POA: Insufficient documentation

## 2019-10-12 DIAGNOSIS — Z8616 Personal history of COVID-19: Secondary | ICD-10-CM | POA: Insufficient documentation

## 2019-10-12 LAB — CBC WITH DIFFERENTIAL/PLATELET
Abs Immature Granulocytes: 0.41 10*3/uL — ABNORMAL HIGH (ref 0.00–0.07)
Basophils Absolute: 0 10*3/uL (ref 0.0–0.1)
Basophils Relative: 0 %
Eosinophils Absolute: 0 10*3/uL (ref 0.0–0.5)
Eosinophils Relative: 0 %
HCT: 43.6 % (ref 39.0–52.0)
Hemoglobin: 14.4 g/dL (ref 13.0–17.0)
Immature Granulocytes: 5 %
Lymphocytes Relative: 27 %
Lymphs Abs: 2.3 10*3/uL (ref 0.7–4.0)
MCH: 27.1 pg (ref 26.0–34.0)
MCHC: 33 g/dL (ref 30.0–36.0)
MCV: 82 fL (ref 80.0–100.0)
Monocytes Absolute: 0.6 10*3/uL (ref 0.1–1.0)
Monocytes Relative: 7 %
Neutro Abs: 5.1 10*3/uL (ref 1.7–7.7)
Neutrophils Relative %: 61 %
Platelets: 562 10*3/uL — ABNORMAL HIGH (ref 150–400)
RBC: 5.32 MIL/uL (ref 4.22–5.81)
RDW: 14.6 % (ref 11.5–15.5)
WBC: 8.5 10*3/uL (ref 4.0–10.5)
nRBC: 0.2 % (ref 0.0–0.2)

## 2019-10-12 LAB — CULTURE, BLOOD (ROUTINE X 2)
Culture: NO GROWTH
Culture: NO GROWTH
Special Requests: ADEQUATE
Special Requests: ADEQUATE

## 2019-10-12 LAB — COMPREHENSIVE METABOLIC PANEL
ALT: 58 U/L — ABNORMAL HIGH (ref 0–44)
AST: 34 U/L (ref 15–41)
Albumin: 3.6 g/dL (ref 3.5–5.0)
Alkaline Phosphatase: 45 U/L (ref 38–126)
Anion gap: 12 (ref 5–15)
BUN: 18 mg/dL (ref 6–20)
CO2: 24 mmol/L (ref 22–32)
Calcium: 8.7 mg/dL — ABNORMAL LOW (ref 8.9–10.3)
Chloride: 99 mmol/L (ref 98–111)
Creatinine, Ser: 0.9 mg/dL (ref 0.61–1.24)
GFR calc non Af Amer: >60 mL/min (ref >60)
Glucose, Bld: 107 mg/dL — ABNORMAL HIGH (ref 70–99)
Potassium: 3.3 mmol/L — ABNORMAL LOW (ref 3.5–5.1)
Sodium: 135 mmol/L (ref 135–145)
Total Bilirubin: 0.7 mg/dL (ref 0.3–1.2)
Total Protein: 7.6 g/dL (ref 6.5–8.1)

## 2019-10-12 LAB — C-REACTIVE PROTEIN: CRP: 2.5 mg/dL — ABNORMAL HIGH (ref <1.0)

## 2019-10-12 LAB — TROPONIN I (HIGH SENSITIVITY)
Troponin I (High Sensitivity): 8 ng/L (ref <18)
Troponin I (High Sensitivity): 6 ng/L (ref <18)

## 2019-10-12 MED ORDER — PREPARATION H 0.25-14-74.9 % RE OINT: 1.0000 "application " | TOPICAL_OINTMENT | Freq: Two times a day (BID) | RECTAL | 0 refills | Status: DC | PRN

## 2019-10-12 NOTE — ED Triage Notes (Signed)
Pt to triage via w/c with no distress noted; EMS brought pt in from home; pt c/o SHOB and mid CP for several hours; pt d/c from hosp yesterday for COVID

## 2019-10-12 NOTE — ED Provider Notes (Signed)
[facility] Emergency Department Provider Note   ____________________________________________    I have reviewed the triage vital signs and the nursing notes.   HISTORY  Chief Complaint Chest discomfort, rectal bleeding    HPI Justin Reid is a 39 y.o. male who presents with complaints of chest discomfort, now resolved as well as rectal bleeding. Per review of records patient was recently discharged from the hospital after treatment for COVID-19, he reports he still has mild shortness of breath but this seems somewhat improved. He did have chest discomfort last night for several hours prior to arrival which resolved. He described as an aching sensation. He is more concerned because after bowel movements he is having bright red blood per rectum for several minutes which is dripping into the toilet. He has never had this before. He reports his stool was brown and normal  Past Medical History:  Diagnosis Date  . Arthritis   . Asthma   . Hypertension     Patient Active Problem List   Diagnosis Date Noted  . Acute respiratory failure with hypoxia (HCC)   . Hyperkalemia   . Pneumonia due to COVID-19 virus 10/07/2019  . Hypokalemia   . Hyponatremia   . Acute midline thoracic back pain   . Sepsis (HCC) 12/24/2017  . Asthma 07/07/2016  . Asthma exacerbation 02/20/2016    History reviewed. No pertinent surgical history.  Prior to Admission medications   Medication Sig Start Date End Date Taking? Authorizing Provider  albuterol (VENTOLIN HFA) 108 (90 Base) MCG/ACT inhaler Inhale 2 puffs into the lungs every 4 (four) hours as needed for wheezing or shortness of breath.    [provider]  amLODipine (NORVASC) 10 MG tablet Take 1 tablet (10 mg total) by mouth daily. 10/11/19 10/10/20  Burnadette Pop, MD  ascorbic acid (VITAMIN C) 500 MG tablet Take 1 tablet (500 mg total) by mouth daily. 10/12/19   Burnadette Pop, MD    guaiFENesin-dextromethorphan (ROBITUSSIN DM) 100-10 MG/5ML syrup Take 1 mL by mouth every 4 (four) hours as needed for cough. 10/11/19   Burnadette Pop, MD  phenylephrine-shark liver oil-mineral oil-petrolatum (PREPARATION H) 0.25-14-74.9 % rectal ointment Place 1 application rectally 2 (two) times daily as needed for hemorrhoids. 10/12/19   Jene Every, MD  predniSONE (DELTASONE) 10 MG tablet Take 1 tablet (10 mg total) by mouth daily with breakfast. 10/11/19   Burnadette Pop, MD  zinc sulfate 220 (50 Zn) MG capsule Take 1 capsule (220 mg total) by mouth daily. 10/12/19   Burnadette Pop, MD     Allergies Patient has no known allergies.  Family History  Problem Relation Age of Onset  . Hypertension Father     Social History Social History   Tobacco Use  . Smoking status: Current Some Day Smoker    Packs/day: 0.25    Years: 5.00    Pack years: 1.25    Types: Cigarettes  . Smokeless tobacco: Never Used  Vaping Use  . Vaping Use: Never used  Substance Use Topics  . Alcohol use: Yes  . Drug use: No    Review of Systems  Constitutional: No fever/chills Eyes: No visual changes.  ENT: No sore throat. Cardiovascular: As above Respiratory: As above Gastrointestinal: As above Genitourinary: Negative for dysuria. Musculoskeletal: Negative for back pain. Skin: Negative for rash. Neurological: Negative for headaches    ____________________________________________   PHYSICAL EXAM:  VITAL SIGNS: ED Triage Vitals  Enc Vitals Group     BP  10/12/19 0417 140/86     Pulse Rate 10/12/19 0417 97     Resp 10/12/19 0417 18     Temp 10/12/19 0417 97.7 F (36.5 C)     Temp Source 10/12/19 0417 Oral     SpO2 10/12/19 0417 94 %     Weight 10/12/19 0418 136.1 kg (300 lb)     Height 10/12/19 0418 1.829 m (6')     Head Circumference --      Peak Flow --      Pain Score 10/12/19 0423 10     Pain Loc --      Pain Edu? --      Excl. in GC? --     Constitutional: Alert and  oriented.  Nose: No congestion/rhinnorhea. Mouth/Throat: Mucous membranes are moist.    Cardiovascular: Normal rate, regular rhythm. Grossly normal heart sounds.  Good peripheral circulation. Respiratory: Normal respiratory effort.  No retractions.  Gastrointestinal: Soft and nontender. No distention. Reassuring exam, nonbleeding hemorrhoid noted at 12 o'clock  Musculoskeletal:   Warm and well perfused Neurologic:  Normal speech and language. No gross focal neurologic deficits are appreciated.  Skin:  Skin is warm, dry and intact. No rash noted. Psychiatric: Mood and affect are normal. Speech and behavior are normal.  ____________________________________________   LABS (all labs ordered are listed, but only abnormal results are displayed)  Labs Reviewed  CBC WITH DIFFERENTIAL/PLATELET - Abnormal; Notable for the following components:      Result Value   Platelets 562 (*)    Abs Immature Granulocytes 0.41 (*)    All other components within normal limits  COMPREHENSIVE METABOLIC PANEL - Abnormal; Notable for the following components:   Potassium 3.3 (*)    Glucose, Bld 107 (*)    Calcium 8.7 (*)    ALT 58 (*)    All other components within normal limits  TROPONIN I (HIGH SENSITIVITY)  TROPONIN I (HIGH SENSITIVITY)   ____________________________________________  EKG  ED ECG REPORT I, Jene Every, the attending physician, personally viewed and interpreted this ECG.  Date: 10/12/2019  Rhythm: normal sinus rhythm QRS Axis: normal Intervals: normal ST/T Wave abnormalities: Nonspecific changes Narrative Interpretation: no evidence of acute ischemia  ____________________________________________  RADIOLOGY  Chest x-ray reviewed by me, multifocal pneumonia consistent with COVID-19 ____________________________________________   PROCEDURES  Procedure(s) performed: No  Procedures   Critical Care performed:  No ____________________________________________   INITIAL IMPRESSION / ASSESSMENT AND PLAN / ED COURSE  Pertinent labs & imaging results that were available during my care of the patient were reviewed by me and considered in my medical decision making (see chart for details).  Patient presents with complaints as above. Chest pain resolved prior to arrival, EKG is nonspecific troponin is in line with prior levels. Suspect pain related to COVID-19. No pleurisy to suggest PE.  Rectal bleeding consistent with hemorrhoidal bleeding, confirmed on exam. Hemoglobin is stable, lab work is otherwise unremarkable.  Chest x-ray read by radiology is improved from prior, vital signs reassuring, 95% on room air. Appropriate for discharge at this time  ______________________   FINAL CLINICAL IMPRESSION(S) / ED DIAGNOSES  Final diagnoses:  Hemorrhoids, unspecified hemorrhoid type  COVID-19  Other chest pain        Note:  This document was prepared using Dragon voice recognition software and may include unintentional dictation errors.   Jene Every, MD 10/12/19 [phone]

## 2019-11-07 ENCOUNTER — Emergency Department
Admission: EM | Admit: 2019-11-07 | Discharge: 2019-11-07 | Disposition: A | Discharge status: Home / Self Care | Payer: Self-pay | Attending: Emergency Medicine | Admitting: Emergency Medicine

## 2019-11-07 ENCOUNTER — Emergency Department: Payer: Self-pay

## 2019-11-07 ENCOUNTER — Other Ambulatory Visit: Payer: Self-pay

## 2019-11-07 DIAGNOSIS — R42 Dizziness and giddiness: Secondary | ICD-10-CM | POA: Insufficient documentation

## 2019-11-07 DIAGNOSIS — F17210 Nicotine dependence, cigarettes, uncomplicated: Secondary | ICD-10-CM | POA: Insufficient documentation

## 2019-11-07 DIAGNOSIS — R002 Palpitations: Secondary | ICD-10-CM | POA: Insufficient documentation

## 2019-11-07 DIAGNOSIS — I10 Essential (primary) hypertension: Secondary | ICD-10-CM | POA: Insufficient documentation

## 2019-11-07 DIAGNOSIS — Z8616 Personal history of COVID-19: Secondary | ICD-10-CM | POA: Insufficient documentation

## 2019-11-07 DIAGNOSIS — J45901 Unspecified asthma with (acute) exacerbation: Secondary | ICD-10-CM | POA: Insufficient documentation

## 2019-11-07 LAB — BASIC METABOLIC PANEL
Anion gap: 12 (ref 5–15)
BUN: 11 mg/dL (ref 6–20)
CO2: 24 mmol/L (ref 22–32)
Calcium: 9.2 mg/dL (ref 8.9–10.3)
Chloride: 98 mmol/L (ref 98–111)
Creatinine, Ser: 0.95 mg/dL (ref 0.61–1.24)
GFR, Estimated: >60 mL/min (ref >60)
Glucose, Bld: 125 mg/dL — ABNORMAL HIGH (ref 70–99)
Potassium: 3.8 mmol/L (ref 3.5–5.1)
Sodium: 134 mmol/L — ABNORMAL LOW (ref 135–145)

## 2019-11-07 LAB — TROPONIN I (HIGH SENSITIVITY): Troponin I (High Sensitivity): 5 ng/L (ref <18)

## 2019-11-07 LAB — CBC
HCT: 38.7 % — ABNORMAL LOW (ref 39.0–52.0)
Hemoglobin: 12.8 g/dL — ABNORMAL LOW (ref 13.0–17.0)
MCH: 27.4 pg (ref 26.0–34.0)
MCHC: 33.1 g/dL (ref 30.0–36.0)
MCV: 82.7 fL (ref 80.0–100.0)
Platelets: 491 10*3/uL — ABNORMAL HIGH (ref 150–400)
RBC: 4.68 MIL/uL (ref 4.22–5.81)
RDW: 14.7 % (ref 11.5–15.5)
WBC: 6.4 10*3/uL (ref 4.0–10.5)
nRBC: 0 % (ref 0.0–0.2)

## 2019-11-07 MED ORDER — ACETAMINOPHEN 500 MG PO TABS
1000.0000 mg | ORAL_TABLET | Freq: Once | ORAL | Status: AC
  Administered 2019-11-07: 1000 mg via ORAL
  Filled 2019-11-07: qty 2

## 2019-11-07 MED ORDER — HYDROXYZINE HCL 25 MG PO TABS: 25.0000 mg | ORAL_TABLET | Freq: Three times a day (TID) | ORAL | 0 refills | Status: DC | PRN

## 2019-11-07 MED ORDER — HYDROXYZINE HCL 25 MG PO TABS
25.0000 mg | ORAL_TABLET | Freq: Once | ORAL | Status: AC
  Administered 2019-11-07: 25 mg via ORAL
  Filled 2019-11-07: qty 1

## 2019-11-07 NOTE — ED Notes (Signed)
Pt given saltines and drink

## 2019-11-07 NOTE — ED Provider Notes (Signed)
[facility] Emergency Department Provider Note ____________________________________________   First MD Initiated Contact with Patient 11/07/19 1657     (approximate)  I have reviewed the triage vital signs and the nursing notes.  HISTORY  Chief Complaint Palpitations and Dizziness   HPI Justin Reid is a 39 y.o. malewho presents to the ED for evaluation of intermittent palpitations and dizziness.  Chart review indicates history of HTN, asthma and osteoarthritis. Admitted to our facility from 10/3-10/7 for acute hypoxic respiratory failure in the setting of COVID-19.  He was discharged on room air.  Patient reports waking up this morning at his normal time with the sensation of heart palpitations and presyncopal dizziness without syncope.  Patient reports this sensation lasted a matter of minutes/hours before self resolving and has not recurred.  He reports feeling well in the ED now and has no complaints.  He does report the sensation of associated mild shortness of breath, but he minimizes this and reports that is now gone.   He denies any associated chest pain, chest pressure, cough, productive cough, syncope, falls, headache, abdominal pain, emesis or diarrhea.   Past Medical History:  Diagnosis Date  . Arthritis   . Asthma   . Hypertension     Patient Active Problem List   Diagnosis Date Noted  . Acute respiratory failure with hypoxia (HCC)   . Hyperkalemia   . Pneumonia due to COVID-19 virus 10/07/2019  . Hypokalemia   . Hyponatremia   . Acute midline thoracic back pain   . Sepsis (HCC) 12/24/2017  . Asthma 07/07/2016  . Asthma exacerbation 02/20/2016    No past surgical history on file.  Prior to Admission medications   Medication Sig Start Date End Date Taking? Authorizing Provider  albuterol (VENTOLIN HFA) 108 (90 Base) MCG/ACT inhaler Inhale 2 puffs into the lungs every 4 (four) hours as needed for wheezing or shortness of breath.     [provider]  amLODipine (NORVASC) 10 MG tablet Take 1 tablet (10 mg total) by mouth daily. 10/11/19 10/10/20  Burnadette Pop, MD  ascorbic acid (VITAMIN C) 500 MG tablet Take 1 tablet (500 mg total) by mouth daily. 10/12/19   Burnadette Pop, MD  guaiFENesin-dextromethorphan (ROBITUSSIN DM) 100-10 MG/5ML syrup Take 1 mL by mouth every 4 (four) hours as needed for cough. 10/11/19   Burnadette Pop, MD  hydrOXYzine (ATARAX/VISTARIL) 25 MG tablet Take 1 tablet (25 mg total) by mouth 3 (three) times daily as needed for anxiety. 11/07/19   Delton Prairie, MD  phenylephrine-shark liver oil-mineral oil-petrolatum (PREPARATION H) 0.25-14-74.9 % rectal ointment Place 1 application rectally 2 (two) times daily as needed for hemorrhoids. 10/12/19   Jene Every, MD  predniSONE (DELTASONE) 10 MG tablet Take 1 tablet (10 mg total) by mouth daily with breakfast. 10/11/19   Burnadette Pop, MD  zinc sulfate 220 (50 Zn) MG capsule Take 1 capsule (220 mg total) by mouth daily. 10/12/19   Burnadette Pop, MD    Allergies Patient has no known allergies.  Family History  Problem Relation Age of Onset  . Hypertension Father     Social History Social History   Tobacco Use  . Smoking status: Current Some Day Smoker    Packs/day: 0.25    Years: 5.00    Pack years: 1.25    Types: Cigarettes  . Smokeless tobacco: Never Used  Vaping Use  . Vaping Use: Never used  Substance Use Topics  . Alcohol use: Yes  . Drug  use: No    Review of Systems  Constitutional: No fever/chills Eyes: No visual changes. ENT: No sore throat. Cardiovascular: Denies chest pain.  Positive for heart palpitations. Respiratory: Denies shortness of breath. Gastrointestinal: No abdominal pain.  No nausea, no vomiting.  No diarrhea.  No constipation. Genitourinary: Negative for dysuria. Musculoskeletal: Negative for back pain. Skin: Negative for rash. Neurological: Negative for headaches, focal weakness or numbness.   Positive for dizziness and negative for syncope.  ____________________________________________   PHYSICAL EXAM:  VITAL SIGNS: Vitals:   11/07/19 1453  BP: (!) 170/101  Pulse: 90  Resp: 18  Temp: (!) 97.4 F (36.3 C)  SpO2: 97%     Constitutional: Alert and oriented. Well appearing and in no acute distress. Eyes: Conjunctivae are normal. PERRL. EOMI. Head: Atraumatic. Nose: No congestion/rhinnorhea. Mouth/Throat: Mucous membranes are moist.  Oropharynx non-erythematous. Neck: No stridor. No cervical spine tenderness to palpation. Cardiovascular: Normal rate, regular rhythm. Grossly normal heart sounds.  Good peripheral circulation. Respiratory: Normal respiratory effort.  No retractions. Lungs CTAB. Gastrointestinal: Soft , nondistended, nontender to palpation. No abdominal bruits. No CVA tenderness. Musculoskeletal: No lower extremity tenderness nor edema.  No joint effusions. No signs of acute trauma. Neurologic:  Normal speech and language. No gross focal neurologic deficits are appreciated. No gait instability noted. Cranial nerves II through XII intact 5/5 strength and sensation in all 4 extremities Independently ambulatory with a normal gait. Skin:  Skin is warm, dry and intact. No rash noted. Psychiatric: Mood and affect are normal. Speech and behavior are normal.  ____________________________________________   LABS (all labs ordered are listed, but only abnormal results are displayed)  Labs Reviewed  BASIC METABOLIC PANEL - Abnormal; Notable for the following components:      Result Value   Sodium 134 (*)    Glucose, Bld 125 (*)    All other components within normal limits  CBC - Abnormal; Notable for the following components:   Hemoglobin 12.8 (*)    HCT 38.7 (*)    Platelets 491 (*)    All other components within normal limits  TROPONIN I (HIGH SENSITIVITY)   ____________________________________________  12 Lead EKG  Sinus rhythm, rate of 102 bpm.   Normal axis and intervals.  No evidence of acute ischemia.  Sinus tachycardia. ____________________________________________  RADIOLOGY  ED MD interpretation: 2 view CXR reviewed by me with resolving bilateral opacities compared to his CXR 1 month ago, no evidence of acute cardiopulmonary pathology.  Official radiology report(s): DG Chest 2 View  Result Date: 11/07/2019 CLINICAL DATA:  Palpitations.  Dizziness. EXAM: CHEST - 2 VIEW COMPARISON:  Radiograph 10/12/2019 FINDINGS: Improvement in heterogeneous bilateral lung opacities from radiographs last month. Mild residual subsegmental opacities persist in the lung bases and perihilar regions. No new or progressive airspace disease. Unchanged heart size and mediastinal contours. No pleural fluid or pneumothorax. No acute osseous abnormalities are seen. IMPRESSION: Improvement in bilateral COVID pneumonia since last month with mild residual subsegmental opacities. No new or progressive abnormalities. Electronically Signed   By: Narda Rutherford M.D.   On: 11/07/2019 15:50    ____________________________________________   PROCEDURES and INTERVENTIONS  Procedure(s) performed (including Critical Care):  Procedures  Medications  acetaminophen (TYLENOL) tablet 1,000 mg (1,000 mg Oral Given 11/07/19 1758)  hydrOXYzine (ATARAX/VISTARIL) tablet 25 mg (25 mg Oral Given 11/07/19 1758)    ____________________________________________   MDM / ED COURSE  39 year old male discharged 1 month ago after admission for COVID-19, presenting with isolated palpitations and presyncopal dizziness,  without evidence of acute pathology and amenable to outpatient management.  Exam is reassuring without evidence of distress, trauma or neurovascular deficits.  He looks well to me.  His blood work is reassuring without evidence of acute derangements.  His EKG shows a normal sinus rhythm and his troponin is negative.  CXR shows resolving Covid infiltrates without  superimposed new infiltrates to suggest interval development of bacterial pneumonia, PTX or other acute pathology.  He is asymptomatic here in the ED, then reports that he feels better after hydroxyzine administration.  His symptoms may be due to long-haul Covid or anxiety.  I see no indication of acute pathology to warrant further diagnostics in the ED or admission, we discussed following up with his PCP and provided him with information to establish with a new PCP at his request.  We discussed return precautions for the ED and patient is medically stable for discharge home.  Clinical Course as of Nov 07 1835  Wed Nov 07, 2019  1820 Reassessed.  Patient reports improving symptoms.  We discussed the possibility of anxiety contributing to his symptoms.  Patient is interested in a prescription for hydroxyzine to use at home as needed.  We discussed return precautions for the ED.   [DS]    Clinical Course User Index [DS] Delton Prairie, MD     ____________________________________________   FINAL CLINICAL IMPRESSION(S) / ED DIAGNOSES  Final diagnoses:  Palpitations  Dizziness     ED Discharge Orders         Ordered    hydrOXYzine (ATARAX/VISTARIL) 25 MG tablet  3 times daily PRN        11/07/19 1823           Bronislaw Switzer Katrinka Blazing   Note:  This document was prepared using Dragon voice recognition software and may include unintentional dictation errors.   Delton Prairie, MD 11/07/19 [phone]

## 2019-11-07 NOTE — ED Triage Notes (Signed)
Pt comes pov with dizziness, hot flashes, and palpitations. Pt states that it started this morning.

## 2019-11-07 NOTE — Discharge Instructions (Signed)
We sent a prescription for hydroxyzine to the [facility] pharmacy.  Pickup.  This is a medication that you can use on an as-needed basis, up to 3-4 times per day for your palpitations and anxiety. It is safe to work, drive with this medication.  It is safe with the other medications that you take regularly.  I would recommend that you take your blood pressure medication, or at least try it in combination with this new hydroxyzine medication.  Your blood pressure was relatively high in the ED and you would benefit from taking this medication.  The heart racing symptoms that you felt with it are likely to improve when you take it with hydroxyzine.  Return to the ED with any worsening symptoms

## 2019-11-25 ENCOUNTER — Emergency Department
Admission: EM | Admit: 2019-11-25 | Discharge: 2019-11-25 | Disposition: A | Discharge status: Home / Self Care | Payer: Self-pay | Attending: Emergency Medicine | Admitting: Emergency Medicine

## 2019-11-25 ENCOUNTER — Emergency Department: Payer: Self-pay

## 2019-11-25 ENCOUNTER — Other Ambulatory Visit: Payer: Self-pay

## 2019-11-25 DIAGNOSIS — J45901 Unspecified asthma with (acute) exacerbation: Secondary | ICD-10-CM | POA: Insufficient documentation

## 2019-11-25 DIAGNOSIS — F17210 Nicotine dependence, cigarettes, uncomplicated: Secondary | ICD-10-CM | POA: Insufficient documentation

## 2019-11-25 DIAGNOSIS — M25462 Effusion, left knee: Secondary | ICD-10-CM

## 2019-11-25 DIAGNOSIS — M1712 Unilateral primary osteoarthritis, left knee: Secondary | ICD-10-CM

## 2019-11-25 DIAGNOSIS — I10 Essential (primary) hypertension: Secondary | ICD-10-CM | POA: Insufficient documentation

## 2019-11-25 DIAGNOSIS — M25562 Pain in left knee: Secondary | ICD-10-CM | POA: Insufficient documentation

## 2019-11-25 DIAGNOSIS — Z8616 Personal history of COVID-19: Secondary | ICD-10-CM | POA: Insufficient documentation

## 2019-11-25 LAB — SYNOVIAL CELL COUNT + DIFF, W/ CRYSTALS
Crystals, Fluid: NONE SEEN
Eosinophils-Synovial: 1 %
Lymphocytes-Synovial Fld: 22 %
Monocyte-Macrophage-Synovial Fluid: 19 %
Neutrophil, Synovial: 57 %
Other Cells-SYN: 1
WBC, Synovial: 205 /mm3 — ABNORMAL HIGH (ref 0–200)

## 2019-11-25 MED ORDER — LIDOCAINE HCL (PF) 1 % IJ SOLN
5.0000 mL | Freq: Once | INTRAMUSCULAR | Status: AC
  Administered 2019-11-25: 5 mL
  Filled 2019-11-25: qty 5

## 2019-11-25 MED ORDER — HYDROCODONE-ACETAMINOPHEN 5-325 MG PO TABS: 1.0000 | ORAL_TABLET | Freq: Four times a day (QID) | ORAL | 0 refills | Status: DC | PRN

## 2019-11-25 MED ORDER — OXYCODONE-ACETAMINOPHEN 5-325 MG PO TABS
1.0000 | ORAL_TABLET | Freq: Once | ORAL | Status: AC
  Administered 2019-11-25: 1 via ORAL
  Filled 2019-11-25: qty 1

## 2019-11-25 NOTE — Discharge Instructions (Addendum)
Please rest ice and elevate left knee.  Use walker as needed for ambulation.  Take medication as needed for pain and call orthopedic office on Monday morning to schedule follow-up appointment.

## 2019-11-25 NOTE — ED Notes (Signed)
Fluid drawn off knee by PA caitlin walked to lab

## 2019-11-25 NOTE — ED Triage Notes (Signed)
Pt c/o left knee pain, medial side. Denies any trauma, pain started X 3 days ago.

## 2019-11-25 NOTE — ED Provider Notes (Signed)
[facility] Emergency Department Provider Note  ____________________________________________   First MD Initiated Contact with Patient 11/25/19 1122     (approximate)  I have reviewed the triage vital signs and the nursing notes.   HISTORY  Chief Complaint Knee Pain  HPI Justin Reid is a 39 y.o. male resents to the emergency department today for 5 days of progressive left knee pain with no trauma.  The patient has no history of prior trauma to the knee.  He states that over the last 5 days he has had increasing pain, most prominent on the medial compartment with increasing swelling.  He has had increase in difficulty with weightbearing.  He currently rates his pain a 10/10.  Denies any erythema, fever, chills or other systemic symptoms.         Past Medical History:  Diagnosis Date  . Arthritis   . Asthma   . Hypertension     Patient Active Problem List   Diagnosis Date Noted  . Acute respiratory failure with hypoxia (HCC)   . Hyperkalemia   . Pneumonia due to COVID-19 virus 10/07/2019  . Hypokalemia   . Hyponatremia   . Acute midline thoracic back pain   . Sepsis (HCC) 12/24/2017  . Asthma 07/07/2016  . Asthma exacerbation 02/20/2016    No past surgical history on file.  Prior to Admission medications   Medication Sig Start Date End Date Taking? Authorizing Provider  albuterol (VENTOLIN HFA) 108 (90 Base) MCG/ACT inhaler Inhale 2 puffs into the lungs every 4 (four) hours as needed for wheezing or shortness of breath.    [provider]  amLODipine (NORVASC) 10 MG tablet Take 1 tablet (10 mg total) by mouth daily. 10/11/19 10/10/20  Burnadette Pop, MD  ascorbic acid (VITAMIN C) 500 MG tablet Take 1 tablet (500 mg total) by mouth daily. 10/12/19   Burnadette Pop, MD  guaiFENesin-dextromethorphan (ROBITUSSIN DM) 100-10 MG/5ML syrup Take 1 mL by mouth every 4 (four) hours as needed for cough. 10/11/19   Burnadette Pop, MD    hydrOXYzine (ATARAX/VISTARIL) 25 MG tablet Take 1 tablet (25 mg total) by mouth 3 (three) times daily as needed for anxiety. 11/07/19   Delton Prairie, MD  phenylephrine-shark liver oil-mineral oil-petrolatum (PREPARATION H) 0.25-14-74.9 % rectal ointment Place 1 application rectally 2 (two) times daily as needed for hemorrhoids. 10/12/19   Jene Every, MD  predniSONE (DELTASONE) 10 MG tablet Take 1 tablet (10 mg total) by mouth daily with breakfast. 10/11/19   Burnadette Pop, MD  zinc sulfate 220 (50 Zn) MG capsule Take 1 capsule (220 mg total) by mouth daily. 10/12/19   Burnadette Pop, MD    Allergies Patient has no known allergies.  Family History  Problem Relation Age of Onset  . Hypertension Father     Social History Social History   Tobacco Use  . Smoking status: Current Some Day Smoker    Packs/day: 0.25    Years: 5.00    Pack years: 1.25    Types: Cigarettes  . Smokeless tobacco: Never Used  Vaping Use  . Vaping Use: Never used  Substance Use Topics  . Alcohol use: Yes  . Drug use: No    Review of Systems Constitutional: No fever/chills Eyes: No visual changes. ENT: No sore throat. Cardiovascular: Denies chest pain. Respiratory: Denies shortness of breath. Gastrointestinal: No abdominal pain.  No nausea, no vomiting.  No diarrhea.  No constipation. Genitourinary: Negative for dysuria. Musculoskeletal: + Left knee pain, negative  for back pain. Skin: Negative for rash. Neurological: Negative for headaches, focal weakness or numbness.   ____________________________________________   PHYSICAL EXAM:  VITAL SIGNS: ED Triage Vitals  Enc Vitals Group     BP 11/25/19 1135 (!) 149/101     Pulse Rate 11/25/19 1135 86     Resp 11/25/19 1135 18     Temp 11/25/19 1135 97.9 F (36.6 C)     Temp Source 11/25/19 1135 Oral     SpO2 11/25/19 1135 98 %     Weight 11/25/19 1136 300 lb (136.1 kg)     Height 11/25/19 1136 5\' 11"  (1.803 m)     Head Circumference --       Peak Flow --      Pain Score 11/25/19 1135 10     Pain Loc --      Pain Edu? --      Excl. in GC? --     Constitutional: Alert and oriented. Well appearing and in no acute distress. Eyes: Conjunctivae are normal. PERRL. EOMI. Head: Atraumatic. Nose: No congestion/rhinnorhea. Mouth/Throat: Mucous membranes are moist.  Oropharynx non-erythematous. Neck: No stridor.   Cardiovascular: Normal rate, regular rhythm. Grossly normal heart sounds.  Good peripheral circulation. Respiratory: Normal respiratory effort.  No retractions. Lungs CTAB. Gastrointestinal: Soft and nontender. No distention. No abdominal bruits. No CVA tenderness. Musculoskeletal: Left knee is exquisitely tender to touch on the medial aspect, patient tolerates peripatellar and lateral palpation.  Extensor mechanism is intact.  Some mild give is appreciated with valgus and varus stress testing.  Patient has decreased range of motion secondary to pain.  Calf is swollen with a 5 cm difference between the left calf and right calf. Neurologic:  Normal speech and language. No gross focal neurologic deficits are appreciated. No gait instability. Skin:  Skin is warm, dry and intact. No rash noted. Psychiatric: Mood and affect are normal. Speech and behavior are normal.  ____________________________________________   LABS (all labs ordered are listed, but only abnormal results are displayed)  Labs Reviewed  BODY FLUID CULTURE  SYNOVIAL CELL COUNT + DIFF, W/ CRYSTALS    ____________________________________________  RADIOLOGY I, 11/27/19, personally viewed and evaluated these images (plain radiographs) as part of my medical decision making, as well as reviewing the written report by the radiologist.  ED provider interpretation: No acute fractures or findings.  Official radiology report(s): DG Lumbar Spine 2-3 Views  Result Date: 11/25/2019 CLINICAL DATA:  Left leg pain. EXAM: LUMBAR SPINE - 2-3 VIEW COMPARISON:   None. FINDINGS: Minimal anterior wedging of L1 with less than 10% loss of anterior height. No other evidence of fracture. No malalignment. Mild degenerative changes at T12-L1. No other abnormalities. IMPRESSION: 1. Very mild anterior wedging at L1 is likely chronic given the lack of a trauma history. Mild degenerative changes at T12-L1. No other abnormalities. Electronically Signed   By: 11/27/2019 III M.D   On: 11/25/2019 12:54   DG Knee Complete 4 Views Left  Result Date: 11/25/2019 CLINICAL DATA:  Left knee pain, no known injury EXAM: LEFT KNEE - COMPLETE 4+ VIEW COMPARISON:  None. FINDINGS: No fracture or dislocation of the left knee. There is mild patellofemoral arthrosis with otherwise preserved joint spaces. Large, nonspecific knee joint effusion. Soft tissues are unremarkable. IMPRESSION: 1. No fracture or dislocation of the left knee. There is mild patellofemoral arthrosis with otherwise preserved joint spaces. 2. Large, nonspecific knee joint effusion. Electronically Signed   By: 11/27/2019.D.  On: 11/25/2019 12:54    ____________________________________________   PROCEDURES  Procedure(s) performed (including Critical Care):  .Joint Aspiration/Arthrocentesis  Date/Time: 11/25/2019 3:53 PM Performed by: Lucy Chris, PA Authorized by: Lucy Chris, PA   Consent:    Consent obtained:  Verbal   Consent given by:  Patient   Risks discussed:  Bleeding, infection, incomplete drainage and pain   Alternatives discussed:  No treatment Location:    Location:  Knee   Knee:  L knee Anesthesia (see MAR for exact dosages):    Anesthesia method:  Local infiltration   Local anesthetic:  Lidocaine 1% w/o epi Procedure details:    Preparation: Patient was prepped and draped in usual sterile fashion     Needle gauge:  18 G   Ultrasound guidance: no     Approach:  Lateral   Aspirate characteristics:  Blood-tinged and serous   Steroid injected: no     Specimen  collected: yes   Post-procedure details:    Dressing:  Adhesive bandage   Patient tolerance of procedure:  Tolerated well, no immediate complications     ____________________________________________   INITIAL IMPRESSION / ASSESSMENT AND PLAN / ED COURSE  As part of my medical decision making, I reviewed the following data within the electronic MEDICAL RECORD NUMBER Nursing notes reviewed and incorporated, Labs reviewed  and Radiograph reviewed         Patient is a 39 year old male here for evaluation of left knee pain.  See HPI for further details.  X-rays are negative for any acute fractures, patient's pain was atraumatic because in nature.  Due to the amount of joint swelling appreciated on exam and on x-ray, a arthrocentesis was discussed with the patient.  He agreed.  The fluid was sent for cell count as well as culture.  The patient is being handed off to Cranston Neighbor, PA-C who will review the results before discharge.      ____________________________________________   FINAL CLINICAL IMPRESSION(S) / ED DIAGNOSES  Final diagnoses:  None     ED Discharge Orders    None      *Please note:  CHARLENE COWDREY was evaluated in Emergency Department on 11/25/2019 for the symptoms described in the history of present illness. He was evaluated in the context of the global COVID-19 pandemic, which necessitated consideration that the patient might be at risk for infection with the SARS-CoV-2 virus that causes COVID-19. Institutional protocols and algorithms that pertain to the evaluation of patients at risk for COVID-19 are in a state of rapid change based on information released by regulatory bodies including the CDC and federal and state organizations. These policies and algorithms were followed during the patient's care in the ED.  Some ED evaluations and interventions may be delayed as a result of limited staffing during and the pandemic.*   Note:  This document was prepared using  Dragon voice recognition software and may include unintentional dictation errors.    Lucy Chris, PA 11/25/19 1555    Dionne Bucy, MD 11/26/19 [phone]

## 2019-11-25 NOTE — ED Provider Notes (Signed)
-----------------------------------------   5:04 PM on 11/25/2019 -----------------------------------------  Blood pressure (!) 149/101, pulse 86, temperature 97.9 F (36.6 C), temperature source Oral, resp. rate 18, height 5\' 11"  (1.803 m), weight 136.1 kg, SpO2 98 %.  Assuming care from [place], [place].  In short, Justin Reid is a 39 y.o. male with a chief complaint of Knee Pain .  Refer to the original H&P for additional details.  The current plan of care is to rest ice and elevate the left knee.  Ace wrap applied.  He is given a walker for ambulation.  He will take Norco as needed for pain.  He will schedule follow-up appointment with orthopedics.  Synovial fluid analysis the left knee showed no crystals or signs of infection.  No signs of blood clot on lower extremity ultrasound.  X-rays show no fracture.  Patient did see improvement with aspiration.  He does have some underlying arthritic changes throughout the left knee.  All concerns and questions were addressed with patient.    30, PA-C 11/25/19 1705    11/27/19, MD 11/26/19 [phone]

## 2019-11-29 LAB — BODY FLUID CULTURE: Culture: NO GROWTH

## 2020-03-17 ENCOUNTER — Other Ambulatory Visit: Payer: Self-pay

## 2020-03-17 ENCOUNTER — Emergency Department
Admission: EM | Admit: 2020-03-17 | Discharge: 2020-03-17 | Disposition: A | Discharge status: Home / Self Care | Payer: Self-pay | Attending: Emergency Medicine | Admitting: Emergency Medicine

## 2020-03-17 ENCOUNTER — Emergency Department: Payer: Self-pay

## 2020-03-17 DIAGNOSIS — J45909 Unspecified asthma, uncomplicated: Secondary | ICD-10-CM | POA: Insufficient documentation

## 2020-03-17 DIAGNOSIS — Z79899 Other long term (current) drug therapy: Secondary | ICD-10-CM | POA: Insufficient documentation

## 2020-03-17 DIAGNOSIS — Z8616 Personal history of COVID-19: Secondary | ICD-10-CM | POA: Insufficient documentation

## 2020-03-17 DIAGNOSIS — I10 Essential (primary) hypertension: Secondary | ICD-10-CM | POA: Insufficient documentation

## 2020-03-17 DIAGNOSIS — M7732 Calcaneal spur, left foot: Secondary | ICD-10-CM | POA: Insufficient documentation

## 2020-03-17 DIAGNOSIS — R0789 Other chest pain: Secondary | ICD-10-CM | POA: Insufficient documentation

## 2020-03-17 DIAGNOSIS — F17210 Nicotine dependence, cigarettes, uncomplicated: Secondary | ICD-10-CM | POA: Insufficient documentation

## 2020-03-17 MED ORDER — NAPROXEN 500 MG PO TABS: 500.0000 mg | ORAL_TABLET | Freq: Two times a day (BID) | ORAL | Status: DC

## 2020-03-17 NOTE — Discharge Instructions (Addendum)
Follow discharge care instructions.  Advised over-the-counter heel spurs.  Take medication as directed.  No acute findings on chest x-ray.

## 2020-03-17 NOTE — ED Provider Notes (Signed)
[facility] Emergency Department Provider Note   ____________________________________________   Event Date/Time   First MD Initiated Contact with Patient 03/17/20 1234     (approximate)  I have reviewed the triage vital signs and the nursing notes.   HISTORY  Chief Complaint Foot Pain    HPI Justin Reid is a 40 y.o. male patient complain nontraumatic left foot pain for greater than a month.  Patient pain is located plantar aspect of the left heel.  Patient the pain increases with weightbearing.  Patient also complain of mild chest discomfort.  Patient stated fire department came to his house last night secondary to CO2 detector going off.  Denies dyspnea or chest pain but  wants to be checked because of history of asthma.  No recent use of inhaler.         Past Medical History:  Diagnosis Date  . Arthritis   . Asthma   . Hypertension     Patient Active Problem List   Diagnosis Date Noted  . Acute respiratory failure with hypoxia (HCC)   . Hyperkalemia   . Pneumonia due to COVID-19 virus 10/07/2019  . Hypokalemia   . Hyponatremia   . Acute midline thoracic back pain   . Sepsis (HCC) 12/24/2017  . Asthma 07/07/2016  . Asthma exacerbation 02/20/2016    History reviewed. No pertinent surgical history.  Prior to Admission medications   Medication Sig Start Date End Date Taking? Authorizing Provider  naproxen (NAPROSYN) 500 MG tablet Take 1 tablet (500 mg total) by mouth 2 (two) times daily with a meal. 03/17/20  Yes Joni Reining, PA-C  albuterol (VENTOLIN HFA) 108 (90 Base) MCG/ACT inhaler Inhale 2 puffs into the lungs every 4 (four) hours as needed for wheezing or shortness of breath.    [provider]  amLODipine (NORVASC) 10 MG tablet Take 1 tablet (10 mg total) by mouth daily. 10/11/19 10/10/20  Burnadette Pop, MD  ascorbic acid (VITAMIN C) 500 MG tablet Take 1 tablet (500 mg total) by mouth daily. 10/12/19   Burnadette Pop,  MD  hydrOXYzine (ATARAX/VISTARIL) 25 MG tablet Take 1 tablet (25 mg total) by mouth 3 (three) times daily as needed for anxiety. 11/07/19   Delton Prairie, MD  phenylephrine-shark liver oil-mineral oil-petrolatum (PREPARATION H) 0.25-14-74.9 % rectal ointment Place 1 application rectally 2 (two) times daily as needed for hemorrhoids. 10/12/19   Jene Every, MD  predniSONE (DELTASONE) 10 MG tablet Take 1 tablet (10 mg total) by mouth daily with breakfast. 10/11/19   Burnadette Pop, MD  zinc sulfate 220 (50 Zn) MG capsule Take 1 capsule (220 mg total) by mouth daily. 10/12/19   Burnadette Pop, MD    Allergies Patient has no known allergies.  Family History  Problem Relation Age of Onset  . Hypertension Father     Social History Social History   Tobacco Use  . Smoking status: Current Some Day Smoker    Packs/day: 0.25    Years: 5.00    Pack years: 1.25    Types: Cigarettes  . Smokeless tobacco: Never Used  Vaping Use  . Vaping Use: Never used  Substance Use Topics  . Alcohol use: Yes  . Drug use: No    Review of Systems Constitutional: No fever/chills Eyes: No visual changes. ENT: No sore throat. Cardiovascular: Denies chest pain. Respiratory: Denies shortness of breath. Gastrointestinal: No abdominal pain.  No nausea, no vomiting.  No diarrhea.  No constipation. Genitourinary: Negative for dysuria.  Musculoskeletal: Negative for back pain. Skin: Negative for rash. Neurological: Negative for headaches, focal weakness or numbness. Endocrine:  Hypertension  ____________________________________________   PHYSICAL EXAM:  VITAL SIGNS: ED Triage Vitals  Enc Vitals Group     BP 03/17/20 1153 (!) 154/106     Pulse Rate 03/17/20 1153 94     Resp 03/17/20 1153 16     Temp 03/17/20 1153 98.3 F (36.8 C)     Temp Source 03/17/20 1153 Oral     SpO2 03/17/20 1154 100 %     Weight 03/17/20 1152 300 lb (136.1 kg)     Height 03/17/20 1152 5\' 9"  (1.753 m)     Head Circumference  --      Peak Flow --      Pain Score 03/17/20 1152 8     Pain Loc --      Pain Edu? --      Excl. in GC? --     Constitutional: Alert and oriented. Well appearing and in no acute distress. Neck:No cervical spine tenderness to palpation. Hematological/Lymphatic/Immunilogical: No cervical lymphadenopathy. Cardiovascular: Normal rate, regular rhythm. Grossly normal heart sounds.  Good peripheral circulation.  Abated blood pressure. Respiratory: Normal respiratory effort.  No retractions. Lungs CTAB. Musculoskeletal: No lower extremity tenderness nor edema.  No joint effusions. Neurologic:  Normal speech and language. No gross focal neurologic deficits are appreciated. No gait instability. Skin:  Skin is warm, dry and intact. No rash noted. Psychiatric: Mood and affect are normal. Speech and behavior are normal.  ____________________________________________   LABS (all labs ordered are listed, but only abnormal results are displayed)  Labs Reviewed - No data to display ____________________________________________  EKG   ____________________________________________  RADIOLOGY I, 03/19/20, personally viewed and evaluated these images (plain radiographs) as part of my medical decision making, as well as reviewing the written report by the radiologist.  ED MD interpretation:    Official radiology report(s): DG Chest 2 View  Result Date: 03/17/2020 CLINICAL DATA:  40 year old male with a history of left heel pain and cough EXAM: CHEST - 2 VIEW COMPARISON:  11/07/2019 FINDINGS: Cardiomediastinal silhouette unchanged in size and contour. No evidence of central vascular congestion. No interlobular septal thickening. No pneumothorax or pleural effusion.  No confluent airspace disease. No displaced fracture. Minimal degenerative changes of the thoracic spine. IMPRESSION: Negative for acute cardiopulmonary disease. Electronically Signed   By: 13/03/2019 D.O.   On: 03/17/2020 13:07    DG Ankle Complete Left  Result Date: 03/17/2020 CLINICAL DATA:  Left heel pain without known injury. EXAM: LEFT ANKLE COMPLETE - 3+ VIEW COMPARISON:  None. FINDINGS: There is no evidence of fracture, dislocation, or joint effusion. There is no evidence of arthropathy. Minimal posterior calcaneal spurring is noted. Soft tissues are unremarkable. IMPRESSION: No acute abnormality seen in the left ankle. Electronically Signed   By: 03/19/2020 M.D.   On: 03/17/2020 13:12    ____________________________________________   PROCEDURES  Procedure(s) performed (including Critical Care):  Procedures   ____________________________________________   INITIAL IMPRESSION / ASSESSMENT AND PLAN / ED COURSE  As part of my medical decision making, I reviewed the following data within the electronic MEDICAL RECORD NUMBER         Complain of plantar left foot pain for approximately a month.  Patient also requesting evaluation because he was exposed to CO2 last night.  Discussed no acute findings on chest x-ray and left foot x-ray.  Patient complaint physical exam consistent  with plantar fasciitis secondary to small heel spur.  Patient given discharge care instruction advised follow-up podiatry if condition worsens.      ____________________________________________   FINAL CLINICAL IMPRESSION(S) / ED DIAGNOSES  Final diagnoses:  Calcaneal spur of left foot     ED Discharge Orders         Ordered    naproxen (NAPROSYN) 500 MG tablet  2 times daily with meals        03/17/20 1356          *Please note:  Justin Reid was evaluated in Emergency Department on 03/17/2020 for the symptoms described in the history of present illness. He was evaluated in the context of the global COVID-19 pandemic, which necessitated consideration that the patient might be at risk for infection with the SARS-CoV-2 virus that causes COVID-19. Institutional protocols and algorithms that pertain to the evaluation  of patients at risk for COVID-19 are in a state of rapid change based on information released by regulatory bodies including the CDC and federal and state organizations. These policies and algorithms were followed during the patient's care in the ED.  Some ED evaluations and interventions may be delayed as a result of limited staffing during and the pandemic.*   Note:  This document was prepared using Dragon voice recognition software and may include unintentional dictation errors.    Joni Reining, PA-C 03/17/20 1358    Minna Antis, MD 03/17/20 [phone]

## 2020-03-17 NOTE — ED Notes (Signed)
See triage note  Presents with pain to left heel area   States pain started about 1 month ago  No injury  ambulates with slight limp d/t pain

## 2020-03-17 NOTE — ED Triage Notes (Signed)
Patient to ER for c/o left foot pain x1 month. Denies any known injury. Patient also reports fire department was called to his house last night for CO2 detector going off at home. Wants to be "checked out for that because I have a history of asthma".

## 2020-03-17 NOTE — ED Triage Notes (Signed)
Pt come with c/o left foot pain for about a month. Pt denies any recent injuries.

## 2020-05-08 ENCOUNTER — Emergency Department
Admission: EM | Admit: 2020-05-08 | Discharge: 2020-05-08 | Disposition: A | Discharge status: Home / Self Care | Payer: Self-pay | Attending: Emergency Medicine | Admitting: Emergency Medicine

## 2020-05-08 ENCOUNTER — Other Ambulatory Visit: Payer: Self-pay

## 2020-05-08 ENCOUNTER — Emergency Department: Payer: Self-pay

## 2020-05-08 DIAGNOSIS — I10 Essential (primary) hypertension: Secondary | ICD-10-CM | POA: Insufficient documentation

## 2020-05-08 DIAGNOSIS — Z8616 Personal history of COVID-19: Secondary | ICD-10-CM | POA: Insufficient documentation

## 2020-05-08 DIAGNOSIS — Z79899 Other long term (current) drug therapy: Secondary | ICD-10-CM | POA: Insufficient documentation

## 2020-05-08 DIAGNOSIS — F17210 Nicotine dependence, cigarettes, uncomplicated: Secondary | ICD-10-CM | POA: Insufficient documentation

## 2020-05-08 DIAGNOSIS — M25461 Effusion, right knee: Secondary | ICD-10-CM | POA: Insufficient documentation

## 2020-05-08 DIAGNOSIS — J45909 Unspecified asthma, uncomplicated: Secondary | ICD-10-CM | POA: Insufficient documentation

## 2020-05-08 DIAGNOSIS — M25462 Effusion, left knee: Secondary | ICD-10-CM | POA: Insufficient documentation

## 2020-05-08 MED ORDER — MELOXICAM 15 MG PO TABS: 15.0000 mg | ORAL_TABLET | Freq: Every day | ORAL | 2 refills | Status: DC

## 2020-05-08 NOTE — ED Triage Notes (Signed)
Pt with bilateral knee pain x several weeks. Pt states he has had fluid drained from his knees before. Pt states he is still able to walk but it is uncomfortable. Pt with edema at both knees, no edema to lower extremities. Pt denies swelling anywhere else.

## 2020-05-08 NOTE — ED Provider Notes (Signed)
[facility] Emergency Department Provider Note  ____________________________________________   Event Date/Time   First MD Initiated Contact with Patient 05/08/20 1205     (approximate)  I have reviewed the triage vital signs and the nursing notes.   HISTORY  Chief Complaint Knee Pain    HPI Justin Reid is a 40 y.o. male this emergency department complaining of bilateral knee pain for 3 weeks.  Patient states he has had fluid drained off of the left knee several months ago.  Painful to walk.  Feels popping and clicking in the right knee but not the left.  No numbness or tingling.  Has not followed up with orthopedics    Past Medical History:  Diagnosis Date  . Arthritis   . Asthma   . Hypertension     Patient Active Problem List   Diagnosis Date Noted  . Acute respiratory failure with hypoxia (HCC)   . Hyperkalemia   . Pneumonia due to COVID-19 virus 10/07/2019  . Hypokalemia   . Hyponatremia   . Acute midline thoracic back pain   . Sepsis (HCC) 12/24/2017  . Asthma 07/07/2016  . Asthma exacerbation 02/20/2016    History reviewed. No pertinent surgical history.  Prior to Admission medications   Medication Sig Start Date End Date Taking? Authorizing Provider  meloxicam (MOBIC) 15 MG tablet Take 1 tablet (15 mg total) by mouth daily. 05/08/20 05/08/21 Yes Jerrelle Michelsen, Roselyn Bering, PA-C  albuterol (VENTOLIN HFA) 108 (90 Base) MCG/ACT inhaler Inhale 2 puffs into the lungs every 4 (four) hours as needed for wheezing or shortness of breath.    [provider]  amLODipine (NORVASC) 10 MG tablet Take 1 tablet (10 mg total) by mouth daily. 10/11/19 10/10/20  Burnadette Pop, MD  ascorbic acid (VITAMIN C) 500 MG tablet Take 1 tablet (500 mg total) by mouth daily. 10/12/19   Burnadette Pop, MD  hydrOXYzine (ATARAX/VISTARIL) 25 MG tablet Take 1 tablet (25 mg total) by mouth 3 (three) times daily as needed for anxiety. 11/07/19   Delton Prairie, MD   phenylephrine-shark liver oil-mineral oil-petrolatum (PREPARATION H) 0.25-14-74.9 % rectal ointment Place 1 application rectally 2 (two) times daily as needed for hemorrhoids. 10/12/19   Jene Every, MD  zinc sulfate 220 (50 Zn) MG capsule Take 1 capsule (220 mg total) by mouth daily. 10/12/19   Burnadette Pop, MD    Allergies Patient has no known allergies.  Family History  Problem Relation Age of Onset  . Hypertension Father     Social History Social History   Tobacco Use  . Smoking status: Current Some Day Smoker    Packs/day: 0.25    Years: 5.00    Pack years: 1.25    Types: Cigarettes  . Smokeless tobacco: Never Used  Vaping Use  . Vaping Use: Never used  Substance Use Topics  . Alcohol use: Yes  . Drug use: No    Review of Systems  Constitutional: No fever/chills Eyes: No visual changes. ENT: No sore throat. Respiratory: Denies cough Cardiovascular: Denies chest pain Gastrointestinal: Denies abdominal pain Genitourinary: Negative for dysuria. Musculoskeletal: Negative for back pain.  Positive bilateral knee pain Skin: Negative for rash. Psychiatric: no mood changes,     ____________________________________________   PHYSICAL EXAM:  VITAL SIGNS: ED Triage Vitals  Enc Vitals Group     BP 05/08/20 1129 (!) 145/106     Pulse Rate 05/08/20 1129 88     Resp 05/08/20 1129 20     Temp  05/08/20 1129 98 F (36.7 C)     Temp Source 05/08/20 1129 Oral     SpO2 05/08/20 1129 96 %     Weight 05/08/20 1130 250 lb (113.4 kg)     Height 05/08/20 1130 5\' 11"  (1.803 m)     Head Circumference --      Peak Flow --      Pain Score 05/08/20 1130 10     Pain Loc --      Pain Edu? --      Excl. in GC? --     Constitutional: Alert and oriented. Well appearing and in no acute distress. Eyes: Conjunctivae are normal.  Head: Atraumatic. Nose: No congestion/rhinnorhea. Mouth/Throat: Mucous membranes are moist.   Neck:  supple no lymphadenopathy  noted Cardiovascular: Normal rate, regular rhythm. Heart sounds are normal Respiratory: Normal respiratory effort.  No retractions, lungs c t a  GU: deferred Musculoskeletal: FROM all extremities, warm and well perfused Neurologic:  Normal speech and language.  Skin:  Skin is warm, dry and intact. No rash noted. Psychiatric: Mood and affect are normal. Speech and behavior are normal.  ____________________________________________   LABS (all labs ordered are listed, but only abnormal results are displayed)  Labs Reviewed - No data to display ____________________________________________   ____________________________________________  RADIOLOGY  X-ray of the right and left knee  ____________________________________________   PROCEDURES  Procedure(s) performed: No  Procedures    ____________________________________________   INITIAL IMPRESSION / ASSESSMENT AND PLAN / ED COURSE  Pertinent labs & imaging results that were available during my care of the patient were reviewed by me and considered in my medical decision making (see chart for details).   Patient is 40 year old male presents with bilateral knee pain.  See HPI.  Physical exam shows patient appears stable.  X-ray of the left and right knees   X-rays reviewed by me confirmed by radiology, no acute abnormality but they do show small effusions  I explained everything to the patient.  Think he should follow-up with orthopedics.  Explained to him to draw fluid off of the knee we might possibly introduce infection.  Due to that it is in both knees feel we can rest and elevate and apply ice.  Follow-up if worsening.  Justin Reid was evaluated in Emergency Department on 05/08/2020 for the symptoms described in the history of present illness. He was evaluated in the context of the global COVID-19 pandemic, which necessitated consideration that the patient might be at risk for infection with the SARS-CoV-2 virus that  causes COVID-19. Institutional protocols and algorithms that pertain to the evaluation of patients at risk for COVID-19 are in a state of rapid change based on information released by regulatory bodies including the CDC and federal and state organizations. These policies and algorithms were followed during the patient's care in the ED.    As part of my medical decision making, I reviewed the following data within the electronic MEDICAL RECORD NUMBER Nursing notes reviewed and incorporated, Old chart reviewed, Radiograph reviewed , Notes from prior ED visits and [place] Controlled Substance Database  ____________________________________________   FINAL CLINICAL IMPRESSION(S) / ED DIAGNOSES  Final diagnoses:  Effusion of both knee joints      NEW MEDICATIONS STARTED DURING THIS VISIT:  New Prescriptions   MELOXICAM (MOBIC) 15 MG TABLET    Take 1 tablet (15 mg total) by mouth daily.     Note:  This document was prepared using 07/08/2020 and may  include unintentional dictation errors.    Faythe Ghee, PA-C 05/08/20 1343    Gilles Chiquito, MD 05/08/20 [phone]

## 2020-05-08 NOTE — Discharge Instructions (Signed)
If you have any Naprosyn left please do not take this with the meloxicam.  Take meloxicam daily and Tylenol for pain in between.  Apply ice.  Return if worsening.  Follow-up with orthopedics

## 2020-06-26 ENCOUNTER — Encounter: Payer: Self-pay | Admitting: Emergency Medicine

## 2020-06-26 ENCOUNTER — Emergency Department: Payer: Self-pay

## 2020-06-26 ENCOUNTER — Emergency Department
Admission: EM | Admit: 2020-06-26 | Discharge: 2020-06-26 | Disposition: A | Discharge status: Home / Self Care | Payer: Self-pay | Attending: Emergency Medicine | Admitting: Emergency Medicine

## 2020-06-26 ENCOUNTER — Other Ambulatory Visit: Payer: Self-pay

## 2020-06-26 DIAGNOSIS — F17210 Nicotine dependence, cigarettes, uncomplicated: Secondary | ICD-10-CM | POA: Insufficient documentation

## 2020-06-26 DIAGNOSIS — Z20822 Contact with and (suspected) exposure to covid-19: Secondary | ICD-10-CM | POA: Insufficient documentation

## 2020-06-26 DIAGNOSIS — Z28310 Unvaccinated for covid-19: Secondary | ICD-10-CM | POA: Insufficient documentation

## 2020-06-26 DIAGNOSIS — Z79899 Other long term (current) drug therapy: Secondary | ICD-10-CM | POA: Insufficient documentation

## 2020-06-26 DIAGNOSIS — I10 Essential (primary) hypertension: Secondary | ICD-10-CM | POA: Insufficient documentation

## 2020-06-26 DIAGNOSIS — J4520 Mild intermittent asthma, uncomplicated: Secondary | ICD-10-CM | POA: Insufficient documentation

## 2020-06-26 DIAGNOSIS — Z8616 Personal history of COVID-19: Secondary | ICD-10-CM | POA: Insufficient documentation

## 2020-06-26 MED ORDER — ALBUTEROL SULFATE HFA 108 (90 BASE) MCG/ACT IN AERS: 2.0000 | INHALATION_SPRAY | Freq: Four times a day (QID) | RESPIRATORY_TRACT | 2 refills | Status: DC | PRN

## 2020-06-26 MED ORDER — ALBUTEROL SULFATE HFA 108 (90 BASE) MCG/ACT IN AERS
2.0000 | INHALATION_SPRAY | RESPIRATORY_TRACT | Status: DC | PRN
  Filled 2020-06-26: qty 6.7

## 2020-06-26 MED ORDER — ALBUTEROL SULFATE (2.5 MG/3ML) 0.083% IN NEBU: 2.5000 mg | INHALATION_SOLUTION | RESPIRATORY_TRACT | 2 refills | Status: DC | PRN

## 2020-06-26 NOTE — ED Provider Notes (Signed)
[facility] Emergency Department Provider Note   ____________________________________________   Event Date/Time   First MD Initiated Contact with Patient 06/26/20 1306     (approximate)  I have reviewed the triage vital signs and the nursing notes.   HISTORY  Chief Complaint Asthma    HPI Justin Reid is a 40 y.o. male patient states he has mild asthma intact.  Patient is out of his nebulizer medication and inhaler.  Patient has recent travel or known contact with COVID-19.  Patient is not taking the vaccine for COVID-19.  Patient has  taken the flu shot.          Past Medical History:  Diagnosis Date   Arthritis    Asthma    Hypertension     Patient Active Problem List   Diagnosis Date Noted   Acute respiratory failure with hypoxia (HCC)    Hyperkalemia    Pneumonia due to COVID-19 virus 10/07/2019   Hypokalemia    Hyponatremia    Acute midline thoracic back pain    Sepsis (HCC) 12/24/2017   Asthma 07/07/2016   Asthma exacerbation 02/20/2016    History reviewed. No pertinent surgical history.  Prior to Admission medications   Medication Sig Start Date End Date Taking? Authorizing Provider  albuterol (PROVENTIL) (2.5 MG/3ML) 0.083% nebulizer solution Take 3 mLs (2.5 mg total) by nebulization every 4 (four) hours as needed for wheezing or shortness of breath. 06/26/20 06/26/21 Yes Joni Reining, PA-C  albuterol (VENTOLIN HFA) 108 (90 Base) MCG/ACT inhaler Inhale 2 puffs into the lungs every 6 (six) hours as needed for wheezing or shortness of breath. 06/26/20  Yes Joni Reining, PA-C  albuterol (VENTOLIN HFA) 108 (90 Base) MCG/ACT inhaler Inhale 2 puffs into the lungs every 4 (four) hours as needed for wheezing or shortness of breath.    [provider]  amLODipine (NORVASC) 10 MG tablet Take 1 tablet (10 mg total) by mouth daily. 10/11/19 10/10/20  Burnadette Pop, MD  ascorbic acid (VITAMIN C) 500 MG tablet Take 1 tablet (500  mg total) by mouth daily. 10/12/19   Burnadette Pop, MD  hydrOXYzine (ATARAX/VISTARIL) 25 MG tablet Take 1 tablet (25 mg total) by mouth 3 (three) times daily as needed for anxiety. 11/07/19   Delton Prairie, MD  meloxicam (MOBIC) 15 MG tablet Take 1 tablet (15 mg total) by mouth daily. 05/08/20 05/08/21  Fisher, Roselyn Bering, PA-C  phenylephrine-shark liver oil-mineral oil-petrolatum (PREPARATION H) 0.25-14-74.9 % rectal ointment Place 1 application rectally 2 (two) times daily as needed for hemorrhoids. 10/12/19   Jene Every, MD  zinc sulfate 220 (50 Zn) MG capsule Take 1 capsule (220 mg total) by mouth daily. 10/12/19   Burnadette Pop, MD    Allergies Patient has no known allergies.  Family History  Problem Relation Age of Onset   Hypertension Father     Social History Social History   Tobacco Use   Smoking status: Some Days    Packs/day: 0.25    Years: 5.00    Pack years: 1.25    Types: Cigarettes   Smokeless tobacco: Never  Vaping Use   Vaping Use: Never used  Substance Use Topics   Alcohol use: Yes   Drug use: No    Review of Systems  Constitutional: No fever/chills Eyes: No visual changes. ENT: No sore throat. Cardiovascular: Denies chest pain. Respiratory: Denies shortness of breath. Gastrointestinal: No abdominal pain.  No nausea, no vomiting.  No diarrhea.  No  constipation. Genitourinary: Negative for dysuria. Musculoskeletal: Negative for back pain. Skin: Negative for rash. Neurological: Negative for headaches, focal weakness or numbness. Endocrine: Hypertension.   ____________________________________________   PHYSICAL EXAM:  VITAL SIGNS: ED Triage Vitals  Enc Vitals Group     BP 06/26/20 1304 (!) 147/100     Pulse Rate 06/26/20 1304 81     Resp 06/26/20 1304 19     Temp 06/26/20 1304 98.7 F (37.1 C)     Temp Source 06/26/20 1304 Oral     SpO2 06/26/20 1304 95 %     Weight 06/26/20 1303 250 lb (113.4 kg)     Height 06/26/20 1303 5\' 11"  (1.803 m)      Head Circumference --      Peak Flow --      Pain Score 06/26/20 1303 0     Pain Loc --      Pain Edu? --      Excl. in GC? --     Constitutional: Alert and oriented. Well appearing and in no acute distress. Nose: No congestion/rhinnorhea. Mouth/Throat: Mucous membranes are moist.  Oropharynx non-erythematous. Neck: No stridor.  No cervical spine tenderness to palpation. Hematological/Lymphatic/Immunilogical: No cervical lymphadenopathy. Cardiovascular: Normal rate, regular rhythm. Grossly normal heart sounds.  Good peripheral circulation.  Elevated blood pressure. Respiratory: Normal respiratory effort.  No retractions. Lungs CTAB. Gastrointestinal: Soft and nontender. No distention. No abdominal bruits. No CVA tenderness. Musculoskeletal: No lower extremity tenderness nor edema.  No joint effusions. Neurologic:  Normal speech and language. No gross focal neurologic deficits are appreciated. No gait instability. Skin:  Skin is warm, dry and intact. No rash noted. Psychiatric: Mood and affect are normal. Speech and behavior are normal.  ____________________________________________   LABS (all labs ordered are listed, but only abnormal results are displayed)  Labs Reviewed  SARS CORONAVIRUS 2 (TAT 6-24 HRS)   ____________________________________________  EKG   ____________________________________________  RADIOLOGY I, 06/28/20, personally viewed and evaluated these images (plain radiographs) as part of my medical decision making, as well as reviewing the written report by the radiologist.  ED MD interpretation: No acute finding on chest x-ray.  Official radiology report(s): DG Chest Portable 1 View  Result Date: 06/26/2020 CLINICAL DATA:  Shortness of breath.  Asthma attack. EXAM: PORTABLE CHEST 1 VIEW COMPARISON:  03/17/2020 FINDINGS: The cardiac silhouette, mediastinal and hilar contours are within normal limits given the AP projection and portable technique. The  lungs are clear. No pleural effusions or pulmonary lesions. No pneumothorax. The bony thorax is intact. IMPRESSION: No acute cardiopulmonary findings. Electronically Signed   By: 03/19/2020 M.D.   On: 06/26/2020 13:43    ____________________________________________   PROCEDURES  Procedure(s) performed (including Critical Care):  Procedures   ____________________________________________   INITIAL IMPRESSION / ASSESSMENT AND PLAN / ED COURSE  As part of my medical decision making, I reviewed the following data within the electronic MEDICAL RECORD NUMBER         Patient presents with mild wheezing and cough.  Patient is asthmatic and has run out of medications.  Discussed no acute findings on chest x-ray.  Patient wheezing resolved status post 1 treatment of DuoNeb.  Patient given discharge care instruction prescription for albuterol inhaler and nebulizer.  [facility]     ____________________________________________   FINAL CLINICAL IMPRESSION(S) / ED DIAGNOSES  Final diagnoses:  Mild intermittent asthma without complication     ED Discharge Orders  Ordered    albuterol (VENTOLIN HFA) 108 (90 Base) MCG/ACT inhaler  Every 6 hours PRN        06/26/20 1400    albuterol (PROVENTIL) (2.5 MG/3ML) 0.083% nebulizer solution  Every 4 hours PRN        06/26/20 1400             Note:  This document was prepared using Dragon voice recognition software and may include unintentional dictation errors.    Joni Reining, PA-C 06/26/20 1404    Concha Se, MD 06/27/20 [phone]

## 2020-06-26 NOTE — Discharge Instructions (Signed)
Read and follow discharge care instruction. 

## 2020-06-26 NOTE — ED Triage Notes (Signed)
Pt comes into the ED via POV c/o asthma attack.  Pt states he is out of his medications at home so he has had no inhaler or breathing treatments.  Pt ambulatory to room and has even and unlabored respirations at this time.

## 2020-06-27 LAB — SARS CORONAVIRUS 2 (TAT 6-24 HRS): SARS Coronavirus 2: NEGATIVE

## 2020-09-14 ENCOUNTER — Encounter: Payer: Self-pay | Admitting: Emergency Medicine

## 2020-09-14 ENCOUNTER — Other Ambulatory Visit: Payer: Self-pay

## 2020-09-14 ENCOUNTER — Emergency Department
Admission: EM | Admit: 2020-09-14 | Discharge: 2020-09-14 | Disposition: A | Discharge status: Home / Self Care | Payer: Self-pay | Attending: Emergency Medicine | Admitting: Emergency Medicine

## 2020-09-14 DIAGNOSIS — Z8616 Personal history of COVID-19: Secondary | ICD-10-CM | POA: Insufficient documentation

## 2020-09-14 DIAGNOSIS — F17210 Nicotine dependence, cigarettes, uncomplicated: Secondary | ICD-10-CM | POA: Insufficient documentation

## 2020-09-14 DIAGNOSIS — M10072 Idiopathic gout, left ankle and foot: Secondary | ICD-10-CM | POA: Insufficient documentation

## 2020-09-14 DIAGNOSIS — I10 Essential (primary) hypertension: Secondary | ICD-10-CM | POA: Insufficient documentation

## 2020-09-14 DIAGNOSIS — Z79899 Other long term (current) drug therapy: Secondary | ICD-10-CM | POA: Insufficient documentation

## 2020-09-14 DIAGNOSIS — J45909 Unspecified asthma, uncomplicated: Secondary | ICD-10-CM | POA: Insufficient documentation

## 2020-09-14 DIAGNOSIS — M79675 Pain in left toe(s): Secondary | ICD-10-CM

## 2020-09-14 DIAGNOSIS — M7989 Other specified soft tissue disorders: Secondary | ICD-10-CM

## 2020-09-14 LAB — CBC
HCT: 41.6 % (ref 39.0–52.0)
Hemoglobin: 13.5 g/dL (ref 13.0–17.0)
MCH: 27.1 pg (ref 26.0–34.0)
MCHC: 32.5 g/dL (ref 30.0–36.0)
MCV: 83.5 fL (ref 80.0–100.0)
Platelets: 427 10*3/uL — ABNORMAL HIGH (ref 150–400)
RBC: 4.98 MIL/uL (ref 4.22–5.81)
RDW: 14.5 % (ref 11.5–15.5)
WBC: 10.2 10*3/uL (ref 4.0–10.5)
nRBC: 0 % (ref 0.0–0.2)

## 2020-09-14 LAB — BASIC METABOLIC PANEL
Anion gap: 11 (ref 5–15)
BUN: 17 mg/dL (ref 6–20)
CO2: 28 mmol/L (ref 22–32)
Calcium: 9.1 mg/dL (ref 8.9–10.3)
Chloride: 95 mmol/L — ABNORMAL LOW (ref 98–111)
Creatinine, Ser: 1 mg/dL (ref 0.61–1.24)
GFR, Estimated: >60 mL/min (ref >60)
Glucose, Bld: 95 mg/dL (ref 70–99)
Potassium: 3.8 mmol/L (ref 3.5–5.1)
Sodium: 134 mmol/L — ABNORMAL LOW (ref 135–145)

## 2020-09-14 LAB — URIC ACID: Uric Acid, Serum: 8.4 mg/dL (ref 3.7–8.6)

## 2020-09-14 MED ORDER — PREDNISONE 10 MG PO TABS: ORAL_TABLET | ORAL | 0 refills | Status: DC

## 2020-09-14 MED ORDER — HYDROCODONE-ACETAMINOPHEN 5-325 MG PO TABS
1.0000 | ORAL_TABLET | Freq: Once | ORAL | Status: AC
  Administered 2020-09-14: 1 via ORAL
  Filled 2020-09-14: qty 1

## 2020-09-14 MED ORDER — CEPHALEXIN 500 MG PO CAPS: 500.0000 mg | ORAL_CAPSULE | Freq: Three times a day (TID) | ORAL | 0 refills | Status: AC

## 2020-09-14 MED ORDER — HYDROCODONE-ACETAMINOPHEN 5-325 MG PO TABS: 1.0000 | ORAL_TABLET | Freq: Three times a day (TID) | ORAL | 0 refills | Status: DC | PRN

## 2020-09-14 MED ORDER — KETOROLAC TROMETHAMINE 30 MG/ML IJ SOLN
30.0000 mg | Freq: Once | INTRAMUSCULAR | Status: AC
  Administered 2020-09-14: 30 mg via INTRAMUSCULAR
  Filled 2020-09-14: qty 1

## 2020-09-14 NOTE — ED Triage Notes (Signed)
Pt reports woke up yesterday with pain to his left great toe. Pt concerned it may be gout

## 2020-09-14 NOTE — ED Provider Notes (Signed)
[facility] Emergency Department Provider Note ____________________________________________  Time seen: 1330  I have reviewed the triage vital signs and the nursing notes.  HISTORY  Chief Complaint  Toe Pain   HPI Justin Reid is a 40 y.o. male presents to the ER today with complaint of left great toe pain and swelling.  He reports this started yesterday.  He noticed this first thing when he woke up in the morning.  He describes the pain as searing, burning and throbbing.  He reports he cannot even stand to have a sock or cover touches toe.  The pain is worse with ambulation.  He denies any injury to the area.  He has no history of gout.  He has taken Tylenol OTC without any relief of symptoms.  Past Medical History:  Diagnosis Date   Arthritis    Asthma    Hypertension     Patient Active Problem List   Diagnosis Date Noted   Acute respiratory failure with hypoxia (HCC)    Hyperkalemia    Pneumonia due to COVID-19 virus 10/07/2019   Hypokalemia    Hyponatremia    Acute midline thoracic back pain    Sepsis (HCC) 12/24/2017   Asthma 07/07/2016   Asthma exacerbation 02/20/2016    History reviewed. No pertinent surgical history.  Prior to Admission medications   Medication Sig Start Date End Date Taking? Authorizing Provider  cephALEXin (KEFLEX) 500 MG capsule Take 1 capsule (500 mg total) by mouth 3 (three) times daily for 7 days. 09/14/20 09/21/20 Yes Mikela Senn, Salvadore Oxford, NP  HYDROcodone-acetaminophen (NORCO/VICODIN) 5-325 MG tablet Take 1 tablet by mouth every 8 (eight) hours as needed for moderate pain. 09/14/20 09/14/21 Yes Lamia Mariner, Salvadore Oxford, NP  predniSONE (DELTASONE) 10 MG tablet Take 6 tabs on day 1, 5 tabs on day 2, 4 tabs on day 3, 3 tabs on day 4, 2 tabs on day 5, 1 tab on day 6 09/14/20  Yes Runa Whittingham, Salvadore Oxford, NP  albuterol (PROVENTIL) (2.5 MG/3ML) 0.083% nebulizer solution Take 3 mLs (2.5 mg total) by nebulization every 4 (four) hours as needed for  wheezing or shortness of breath. 06/26/20 06/26/21  Joni Reining, PA-C  albuterol (VENTOLIN HFA) 108 (90 Base) MCG/ACT inhaler Inhale 2 puffs into the lungs every 4 (four) hours as needed for wheezing or shortness of breath.    [provider]  albuterol (VENTOLIN HFA) 108 (90 Base) MCG/ACT inhaler Inhale 2 puffs into the lungs every 6 (six) hours as needed for wheezing or shortness of breath. 06/26/20   Joni Reining, PA-C  amLODipine (NORVASC) 10 MG tablet Take 1 tablet (10 mg total) by mouth daily. 10/11/19 10/10/20  Burnadette Pop, MD  ascorbic acid (VITAMIN C) 500 MG tablet Take 1 tablet (500 mg total) by mouth daily. 10/12/19   Burnadette Pop, MD  hydrOXYzine (ATARAX/VISTARIL) 25 MG tablet Take 1 tablet (25 mg total) by mouth 3 (three) times daily as needed for anxiety. 11/07/19   Delton Prairie, MD  meloxicam (MOBIC) 15 MG tablet Take 1 tablet (15 mg total) by mouth daily. 05/08/20 05/08/21  Fisher, Roselyn Bering, PA-C  phenylephrine-shark liver oil-mineral oil-petrolatum (PREPARATION H) 0.25-14-74.9 % rectal ointment Place 1 application rectally 2 (two) times daily as needed for hemorrhoids. 10/12/19   Jene Every, MD  zinc sulfate 220 (50 Zn) MG capsule Take 1 capsule (220 mg total) by mouth daily. 10/12/19   Burnadette Pop, MD    Allergies Patient has no known allergies.  Family History  Problem Relation Age of Onset   Hypertension Father     Social History Social History   Tobacco Use   Smoking status: Some Days    Packs/day: 0.25    Years: 5.00    Pack years: 1.25    Types: Cigarettes   Smokeless tobacco: Never  Vaping Use   Vaping Use: Never used  Substance Use Topics   Alcohol use: Yes   Drug use: No    Review of Systems  Constitutional: Negative for fever, chills or body. Cardiovascular: Negative for chest pain or chest tightness. Respiratory: Negative for cough or shortness of breath. Musculoskeletal: Positive for left great toe pain and swelling, pain with  ambulation. Skin: Positive for redness and warmth of left great toe. Neurological: Negative for focal weakness, tingling or numbness. ____________________________________________  PHYSICAL EXAM:  VITAL SIGNS: ED Triage Vitals  Enc Vitals Group     BP 09/14/20 1322 (!) 152/107     Pulse Rate 09/14/20 1322 95     Resp 09/14/20 1322 18     Temp 09/14/20 1322 98.2 F (36.8 C)     Temp Source 09/14/20 1322 Oral     SpO2 09/14/20 1322 93 %     Weight 09/14/20 1319 249 lb 1.9 oz (113 kg)     Height 09/14/20 1319 5\' 11"  (1.803 m)     Head Circumference --      Peak Flow --      Pain Score 09/14/20 1319 10     Pain Loc --      Pain Edu? --      Excl. in GC? --     Constitutional: Alert and oriented.  Obese, in no distress. Head: Normocephalic. Cardiovascular: Normal rate, regular rhythm.  Pedal pulse 2+ on the left. Respiratory: Normal respiratory effort. No wheezes/rales/rhonchi noted. Musculoskeletal: Pain with passive flexion and extension of the left great toe.  1+ swelling with erythema noted over the inferior MTP extending to the proximal phalanx. Neurologic:  Normal speech and language. No gross focal neurologic deficits are appreciated. Skin:  Skin is warm, dry and intact. No obvious infection noted from the nail bed but does have erythema and warmth close to the proximal aspect of the nail bed.  ____________________________________________   LABS  Labs Reviewed  BASIC METABOLIC PANEL - Abnormal; Notable for the following components:      Result Value   Sodium 134 (*)    Chloride 95 (*)    All other components within normal limits  CBC - Abnormal; Notable for the following components:   Platelets 427 (*)    All other components within normal limits  URIC ACID    ____________________________________________    INITIAL IMPRESSION / ASSESSMENT AND PLAN / ED COURSE  Left Great Toe Pain and Swelling:  DDx include paronychia, gout CBC, BMET, uric acid level all  normal Toradol 30 mg IM x 1 in ER Norco 5-325 mg PO x 1 in ER RX for Prednisone x 6 days RX for Keflex 500 mg 3 x day x 7 days RX for Norco 5-325 mg Q8H prn, sedation caution given Encouraged elevation Work note provided Return precautions discussed      I reviewed the patient's prescription history over the last 12 months in the multi-state controlled substances database(s) that includes [place], [place], [place], [place], [place], [place], [place], [place], [place], [place], [place], [place], [place], and [place].  Results were notable for Hydrocodone 5-325 mg #10, 11/2019.  ____________________________________________  FINAL CLINICAL IMPRESSION(S) / ED DIAGNOSES  Final diagnoses:  Pain and swelling of toe of left foot  Acute idiopathic gout of left foot      Lorre Munroe, NP 09/14/20 1438    Minna Antis, MD 09/14/20 1531

## 2020-09-14 NOTE — Discharge Instructions (Addendum)
You were seen today for left great toe pain and swelling.  This is likely gout however we cannot rule out an infection around the nailbed.  I am putting you on prednisone for the next 6 days.  Please do not take any anti-inflammatories while you are taking prednisone.  I am also covering you with oral antibiotics 3 times daily for the next 7 days.  I have given you hydrocodone for severe pain.  This may cause sedation.  We recommend elevation.

## 2020-10-08 ENCOUNTER — Emergency Department: Payer: Self-pay

## 2020-10-08 ENCOUNTER — Emergency Department
Admission: EM | Admit: 2020-10-08 | Discharge: 2020-10-08 | Disposition: A | Discharge status: Home / Self Care | Payer: Self-pay | Attending: Emergency Medicine | Admitting: Emergency Medicine

## 2020-10-08 ENCOUNTER — Other Ambulatory Visit: Payer: Self-pay

## 2020-10-08 ENCOUNTER — Encounter: Payer: Self-pay | Admitting: Emergency Medicine

## 2020-10-08 DIAGNOSIS — F17210 Nicotine dependence, cigarettes, uncomplicated: Secondary | ICD-10-CM | POA: Insufficient documentation

## 2020-10-08 DIAGNOSIS — J45901 Unspecified asthma with (acute) exacerbation: Secondary | ICD-10-CM | POA: Insufficient documentation

## 2020-10-08 DIAGNOSIS — M1000 Idiopathic gout, unspecified site: Secondary | ICD-10-CM

## 2020-10-08 DIAGNOSIS — Z7951 Long term (current) use of inhaled steroids: Secondary | ICD-10-CM | POA: Insufficient documentation

## 2020-10-08 DIAGNOSIS — I10 Essential (primary) hypertension: Secondary | ICD-10-CM | POA: Insufficient documentation

## 2020-10-08 DIAGNOSIS — Z79899 Other long term (current) drug therapy: Secondary | ICD-10-CM | POA: Insufficient documentation

## 2020-10-08 DIAGNOSIS — Z8616 Personal history of COVID-19: Secondary | ICD-10-CM | POA: Insufficient documentation

## 2020-10-08 DIAGNOSIS — M10072 Idiopathic gout, left ankle and foot: Secondary | ICD-10-CM | POA: Insufficient documentation

## 2020-10-08 MED ORDER — OXYCODONE-ACETAMINOPHEN 5-325 MG PO TABS
1.0000 | ORAL_TABLET | Freq: Once | ORAL | Status: AC
  Administered 2020-10-08: 1 via ORAL
  Filled 2020-10-08: qty 1

## 2020-10-08 MED ORDER — COLCHICINE 0.6 MG PO TABS: 0.6000 mg | ORAL_TABLET | Freq: Two times a day (BID) | ORAL | 2 refills | Status: DC

## 2020-10-08 MED ORDER — ALLOPURINOL 300 MG PO TABS: 300.0000 mg | ORAL_TABLET | Freq: Every day | ORAL | 2 refills | Status: DC

## 2020-10-08 MED ORDER — OXYCODONE-ACETAMINOPHEN 5-325 MG PO TABS: 1.0000 | ORAL_TABLET | ORAL | 0 refills | Status: DC | PRN

## 2020-10-08 NOTE — Discharge Instructions (Addendum)
Follow-up with podiatry.  Please call for an appointment.  Return emergency department worsening.  Take medications as prescribed.  Try to follow a low purine diet as this will help decrease the amount of uric acid in your toe

## 2020-10-08 NOTE — ED Triage Notes (Signed)
C/O left great toe swelling.  States was seen through ED in September for left great toe pain and swelling, diagnosed with Gout.  Pain had improved, but swelling has worsened and pain returned recently.  AAOx3.  Skin warm and dry. NAD

## 2020-10-08 NOTE — ED Provider Notes (Signed)
[facility] Emergency Department Provider Note  ____________________________________________   Event Date/Time   First MD Initiated Contact with Patient 10/08/20 1036     (approximate)  I have reviewed the triage vital signs and the nursing notes.   HISTORY  Chief Complaint Foot Pain    HPI Justin Reid is a 40 y.o. male presents emergency department complaining of left great toe pain.  Patient was diagnosed with gout about 2 weeks ago.  States the swelling has not gone down and has darkened.  States the pain is starting to return.  He denies any fever or chills.  No drainage from the area.  Past Medical History:  Diagnosis Date   Arthritis    Asthma    Hypertension     Patient Active Problem List   Diagnosis Date Noted   Acute respiratory failure with hypoxia (HCC)    Hyperkalemia    Pneumonia due to COVID-19 virus 10/07/2019   Hypokalemia    Hyponatremia    Acute midline thoracic back pain    Sepsis (HCC) 12/24/2017   Asthma 07/07/2016   Asthma exacerbation 02/20/2016    History reviewed. No pertinent surgical history.  Prior to Admission medications   Medication Sig Start Date End Date Taking? Authorizing Provider  allopurinol (ZYLOPRIM) 300 MG tablet Take 1 tablet (300 mg total) by mouth daily. 10/08/20 10/08/21 Yes Caydee Talkington, Roselyn Bering, PA-C  colchicine 0.6 MG tablet Take 1 tablet (0.6 mg total) by mouth 2 (two) times daily. 10/08/20 10/08/21 Yes Lynae Pederson, Roselyn Bering, PA-C  oxyCODONE-acetaminophen (PERCOCET) 5-325 MG tablet Take 1 tablet by mouth every 4 (four) hours as needed for severe pain. 10/08/20 10/08/21 Yes Nickolaos Brallier, Roselyn Bering, PA-C  albuterol (PROVENTIL) (2.5 MG/3ML) 0.083% nebulizer solution Take 3 mLs (2.5 mg total) by nebulization every 4 (four) hours as needed for wheezing or shortness of breath. 06/26/20 06/26/21  Joni Reining, PA-C  albuterol (VENTOLIN HFA) 108 (90 Base) MCG/ACT inhaler Inhale 2 puffs into the lungs every 4 (four) hours  as needed for wheezing or shortness of breath.    [provider]  albuterol (VENTOLIN HFA) 108 (90 Base) MCG/ACT inhaler Inhale 2 puffs into the lungs every 6 (six) hours as needed for wheezing or shortness of breath. 06/26/20   Joni Reining, PA-C  amLODipine (NORVASC) 10 MG tablet Take 1 tablet (10 mg total) by mouth daily. 10/11/19 10/10/20  Burnadette Pop, MD  ascorbic acid (VITAMIN C) 500 MG tablet Take 1 tablet (500 mg total) by mouth daily. 10/12/19   Burnadette Pop, MD  HYDROcodone-acetaminophen (NORCO/VICODIN) 5-325 MG tablet Take 1 tablet by mouth every 8 (eight) hours as needed for moderate pain. 09/14/20 09/14/21  Lorre Munroe, NP  hydrOXYzine (ATARAX/VISTARIL) 25 MG tablet Take 1 tablet (25 mg total) by mouth 3 (three) times daily as needed for anxiety. 11/07/19   Delton Prairie, MD  meloxicam (MOBIC) 15 MG tablet Take 1 tablet (15 mg total) by mouth daily. 05/08/20 05/08/21  Ermagene Saidi, Roselyn Bering, PA-C  phenylephrine-shark liver oil-mineral oil-petrolatum (PREPARATION H) 0.25-14-74.9 % rectal ointment Place 1 application rectally 2 (two) times daily as needed for hemorrhoids. 10/12/19   Jene Every, MD  predniSONE (DELTASONE) 10 MG tablet Take 6 tabs on day 1, 5 tabs on day 2, 4 tabs on day 3, 3 tabs on day 4, 2 tabs on day 5, 1 tab on day 6 09/14/20   Lorre Munroe, NP  zinc sulfate 220 (50 Zn) MG capsule Take 1  capsule (220 mg total) by mouth daily. 10/12/19   Burnadette Pop, MD    Allergies Patient has no known allergies.  Family History  Problem Relation Age of Onset   Hypertension Father     Social History Social History   Tobacco Use   Smoking status: Some Days    Packs/day: 0.25    Years: 5.00    Pack years: 1.25    Types: Cigarettes   Smokeless tobacco: Never  Vaping Use   Vaping Use: Never used  Substance Use Topics   Alcohol use: Yes   Drug use: No    Review of Systems  Constitutional: No fever/chills Eyes: No visual changes. ENT: No sore  throat. Respiratory: Denies cough Cardiovascular: Denies chest pain Gastrointestinal: Denies abdominal pain Genitourinary: Negative for dysuria. Musculoskeletal: Negative for back pain. Skin: Negative for rash. Psychiatric: no mood changes,     ____________________________________________   PHYSICAL EXAM:  VITAL SIGNS: ED Triage Vitals  Enc Vitals Group     BP 10/08/20 1008 (!) 148/105     Pulse Rate 10/08/20 1008 86     Resp 10/08/20 1008 20     Temp 10/08/20 1008 98.3 F (36.8 C)     Temp Source 10/08/20 1008 Oral     SpO2 10/08/20 1008 98 %     Weight 10/08/20 0952 249 lb 1.9 oz (113 kg)     Height 10/08/20 0952 5\' 11"  (1.803 m)     Head Circumference --      Peak Flow --      Pain Score 10/08/20 0952 8     Pain Loc --      Pain Edu? --      Excl. in GC? --     Constitutional: Alert and oriented. Well appearing and in no acute distress. Eyes: Conjunctivae are normal.  Head: Atraumatic. Nose: No congestion/rhinnorhea. Mouth/Throat: Mucous membranes are moist.   Neck:  supple no lymphadenopathy noted Cardiovascular: Normal rate, regular rhythm.  Respiratory: Normal respiratory effort.  No retractions, GU: deferred Musculoskeletal: FROM all extremities, warm and well perfused, left great toe has a swollen area along the distal phalanx, no redness but the skin has a darker appearance than the remainder of his skin.  Area is mildly fluctuant but also hard to touch Neurologic:  Normal speech and language.  Skin:  Skin is warm, dry and intact. No rash noted. Psychiatric: Mood and affect are normal. Speech and behavior are normal.  ____________________________________________   LABS (all labs ordered are listed, but only abnormal results are displayed)  Labs Reviewed - No data to display ____________________________________________   ____________________________________________  RADIOLOGY  X-ray of the left great  toe  ____________________________________________   PROCEDURES  Procedure(s) performed: No  Procedures    ____________________________________________   INITIAL IMPRESSION / ASSESSMENT AND PLAN / ED COURSE  Pertinent labs & imaging results that were available during my care of the patient were reviewed by me and considered in my medical decision making (see chart for details).   The patient is 40 year old male presents for left great toe pain.  See HPI.  Physical exam shows patient appears stable.  X-ray of the left great toe  X-ray left great toe does not show any osteomyelitis.  Reviewed by me and confirmed by radiology.  Did explain the findings to the patient.  I feel that this is a collection of uric acid crystals.  He is to follow-up with podiatry.  I did start him on allopurinol, colchicine, and  given pain medication.  He is instructed to eat a low purine diet.  Information was given to him at discharge.  He is in agreement treatment plan.  Discharged stable condition.  To return if worsening.  Justin Reid was evaluated in Emergency Department on 10/08/2020 for the symptoms described in the history of present illness. He was evaluated in the context of the global COVID-19 pandemic, which necessitated consideration that the patient might be at risk for infection with the SARS-CoV-2 virus that causes COVID-19. Institutional protocols and algorithms that pertain to the evaluation of patients at risk for COVID-19 are in a state of rapid change based on information released by regulatory bodies including the CDC and federal and state organizations. These policies and algorithms were followed during the patient's care in the ED.    As part of my medical decision making, I reviewed the following data within the electronic MEDICAL RECORD NUMBER Nursing notes reviewed and incorporated, Old chart reviewed, Radiograph reviewed , Notes from prior ED visits, and [place] Controlled Substance  Database  ____________________________________________   FINAL CLINICAL IMPRESSION(S) / ED DIAGNOSES  Final diagnoses:  Idiopathic gout, unspecified chronicity, unspecified site      NEW MEDICATIONS STARTED DURING THIS VISIT:  Discharge Medication List as of 10/08/2020 12:38 PM     START taking these medications   Details  allopurinol (ZYLOPRIM) 300 MG tablet Take 1 tablet (300 mg total) by mouth daily., Starting Wed 10/08/2020, Until Thu 10/08/2021, Normal    colchicine 0.6 MG tablet Take 1 tablet (0.6 mg total) by mouth 2 (two) times daily., Starting Wed 10/08/2020, Until Thu 10/08/2021, Normal    oxyCODONE-acetaminophen (PERCOCET) 5-325 MG tablet Take 1 tablet by mouth every 4 (four) hours as needed for severe pain., Starting Wed 10/08/2020, Until Thu 10/08/2021 at 2359, Normal         Note:  This document was prepared using Dragon voice recognition software and may include unintentional dictation errors.    Faythe Ghee, PA-C 10/08/20 1344    Dionne Bucy, MD 10/08/20 1524

## 2020-10-29 ENCOUNTER — Emergency Department
Admission: EM | Admit: 2020-10-29 | Discharge: 2020-10-29 | Disposition: A | Discharge status: Home / Self Care | Payer: Self-pay | Attending: Emergency Medicine | Admitting: Emergency Medicine

## 2020-10-29 ENCOUNTER — Other Ambulatory Visit: Payer: Self-pay

## 2020-10-29 ENCOUNTER — Emergency Department: Payer: Self-pay

## 2020-10-29 DIAGNOSIS — F17210 Nicotine dependence, cigarettes, uncomplicated: Secondary | ICD-10-CM | POA: Insufficient documentation

## 2020-10-29 DIAGNOSIS — Z20822 Contact with and (suspected) exposure to covid-19: Secondary | ICD-10-CM | POA: Insufficient documentation

## 2020-10-29 DIAGNOSIS — Z8616 Personal history of COVID-19: Secondary | ICD-10-CM | POA: Insufficient documentation

## 2020-10-29 DIAGNOSIS — I10 Essential (primary) hypertension: Secondary | ICD-10-CM | POA: Insufficient documentation

## 2020-10-29 DIAGNOSIS — J069 Acute upper respiratory infection, unspecified: Secondary | ICD-10-CM | POA: Insufficient documentation

## 2020-10-29 DIAGNOSIS — J45909 Unspecified asthma, uncomplicated: Secondary | ICD-10-CM | POA: Insufficient documentation

## 2020-10-29 DIAGNOSIS — Z79899 Other long term (current) drug therapy: Secondary | ICD-10-CM | POA: Insufficient documentation

## 2020-10-29 LAB — RESP PANEL BY RT-PCR (FLU A&B, COVID) ARPGX2
Influenza A by PCR: NEGATIVE
Influenza B by PCR: NEGATIVE
SARS Coronavirus 2 by RT PCR: NEGATIVE

## 2020-10-29 MED ORDER — BENZONATATE 100 MG PO CAPS: 100.0000 mg | ORAL_CAPSULE | Freq: Three times a day (TID) | ORAL | 0 refills | Status: DC | PRN

## 2020-10-29 MED ORDER — PREDNISONE 20 MG PO TABS
60.0000 mg | ORAL_TABLET | Freq: Once | ORAL | Status: AC
  Administered 2020-10-29: 60 mg via ORAL
  Filled 2020-10-29: qty 3

## 2020-10-29 MED ORDER — PREDNISONE 20 MG PO TABS: 40.0000 mg | ORAL_TABLET | Freq: Every day | ORAL | 0 refills | Status: AC

## 2020-10-29 MED ORDER — ALBUTEROL SULFATE HFA 108 (90 BASE) MCG/ACT IN AERS
4.0000 | INHALATION_SPRAY | Freq: Once | RESPIRATORY_TRACT | Status: AC
  Administered 2020-10-29: 4 via RESPIRATORY_TRACT
  Filled 2020-10-29: qty 6.7

## 2020-10-29 MED ORDER — AZITHROMYCIN 250 MG PO TABS: ORAL_TABLET | ORAL | 0 refills | Status: DC

## 2020-10-29 NOTE — ED Provider Notes (Signed)
Emergency Medicine Provider Triage Evaluation Note  Justin Reid , a 40 y.o. male  was evaluated in triage.  Pt complains of low energy/fatigue and some mild SOB. He notes symptom onset last night. He reports chills, but denies frank fevers.  Review of Systems  Positive: Fatigue, SOB Negative: Fevers, NV  Physical Exam  There were no vitals taken for this visit. Gen:   Awake, no distress  NAD Resp:  Normal effort mild expiratory wheeze MSK:   Moves extremities without difficulty  Other:  CVS: RRR  Medical Decision Making  Medically screening exam initiated at 12:15 PM.  Appropriate orders placed.  LA DIBELLA was informed that the remainder of the evaluation will be completed by another provider, this initial triage assessment does not replace that evaluation, and the importance of remaining in the ED until their evaluation is complete.  Patient with ED evaluation of symptoms including chills, low energy, and shortness of breath.    Lissa Hoard, PA-C 10/29/20 1217    Shaune Pollack, MD 10/29/20 [phone]

## 2020-10-29 NOTE — Discharge Instructions (Signed)
Take the azithromycin if your cough does not improve or if you begin having fevers, sputum production

## 2020-10-29 NOTE — ED Triage Notes (Signed)
Pt c/o generalized fatigue, cold sweats, chest pain , SOB since last night. Recent trip to [place] over the weekends.

## 2020-10-29 NOTE — ED Notes (Signed)
Follow up pcp info provided all questions answered  

## 2020-10-29 NOTE — ED Provider Notes (Signed)
[facility] Emergency Department Provider Note  ____________________________________________   Event Date/Time   First MD Initiated Contact with Patient 10/29/20 1240     (approximate)  I have reviewed the triage vital signs and the nursing notes.   HISTORY  Chief Complaint URI    HPI Justin Reid is a 40 y.o. male with past medical history of asthma/COPD here with chills, cough.  The patient states that over the last day, he has had chills, cough, mild headache, and general fatigue.  He traveled over the weekend and was potentially exposed to other sick people.  He states his symptoms began with chills and feeling like he had a fever but did not measure his temperature.  He then began to have a dry cough with some mild wheezing.  Feels similar to his previous episodes of bronchitis.  Denies any abdominal pain, nausea, vomiting, or diarrhea.  No rash.  No recent travel outside [place].  No other complaints.    Past Medical History:  Diagnosis Date   Arthritis    Asthma    Hypertension     Patient Active Problem List   Diagnosis Date Noted   Acute respiratory failure with hypoxia (HCC)    Hyperkalemia    Pneumonia due to COVID-19 virus 10/07/2019   Hypokalemia    Hyponatremia    Acute midline thoracic back pain    Sepsis (HCC) 12/24/2017   Asthma 07/07/2016   Asthma exacerbation 02/20/2016    History reviewed. No pertinent surgical history.  Prior to Admission medications   Medication Sig Start Date End Date Taking? Authorizing Provider  azithromycin (ZITHROMAX Z-PAK) 250 MG tablet Take 2 tablets (500 mg) on  Day 1,  followed by 1 tablet (250 mg) once daily on Days 2 through 5. 10/29/20  Yes Shaune Pollack, MD  benzonatate (TESSALON PERLES) 100 MG capsule Take 1 capsule (100 mg total) by mouth 3 (three) times daily as needed for cough. 10/29/20 10/29/21 Yes Shaune Pollack, MD  predniSONE (DELTASONE) 20 MG tablet Take 2 tablets (40  mg total) by mouth daily for 5 days. 10/29/20 11/03/20 Yes Shaune Pollack, MD  albuterol (PROVENTIL) (2.5 MG/3ML) 0.083% nebulizer solution Take 3 mLs (2.5 mg total) by nebulization every 4 (four) hours as needed for wheezing or shortness of breath. 06/26/20 06/26/21  Joni Reining, PA-C  albuterol (VENTOLIN HFA) 108 (90 Base) MCG/ACT inhaler Inhale 2 puffs into the lungs every 4 (four) hours as needed for wheezing or shortness of breath.    [provider]  albuterol (VENTOLIN HFA) 108 (90 Base) MCG/ACT inhaler Inhale 2 puffs into the lungs every 6 (six) hours as needed for wheezing or shortness of breath. 06/26/20   Joni Reining, PA-C  allopurinol (ZYLOPRIM) 300 MG tablet Take 1 tablet (300 mg total) by mouth daily. 10/08/20 10/08/21  Fisher, Roselyn Bering, PA-C  amLODipine (NORVASC) 10 MG tablet Take 1 tablet (10 mg total) by mouth daily. 10/11/19 10/10/20  Burnadette Pop, MD  ascorbic acid (VITAMIN C) 500 MG tablet Take 1 tablet (500 mg total) by mouth daily. 10/12/19   Burnadette Pop, MD  colchicine 0.6 MG tablet Take 1 tablet (0.6 mg total) by mouth 2 (two) times daily. 10/08/20 10/08/21  Fisher, Roselyn Bering, PA-C  HYDROcodone-acetaminophen (NORCO/VICODIN) 5-325 MG tablet Take 1 tablet by mouth every 8 (eight) hours as needed for moderate pain. 09/14/20 09/14/21  Lorre Munroe, NP  hydrOXYzine (ATARAX/VISTARIL) 25 MG tablet Take 1 tablet (25 mg total)  by mouth 3 (three) times daily as needed for anxiety. 11/07/19   Delton Prairie, MD  meloxicam (MOBIC) 15 MG tablet Take 1 tablet (15 mg total) by mouth daily. 05/08/20 05/08/21  Fisher, Roselyn Bering, PA-C  oxyCODONE-acetaminophen (PERCOCET) 5-325 MG tablet Take 1 tablet by mouth every 4 (four) hours as needed for severe pain. 10/08/20 10/08/21  Fisher, Roselyn Bering, PA-C  phenylephrine-shark liver oil-mineral oil-petrolatum (PREPARATION H) 0.25-14-74.9 % rectal ointment Place 1 application rectally 2 (two) times daily as needed for hemorrhoids. 10/12/19   Jene Every,  MD  zinc sulfate 220 (50 Zn) MG capsule Take 1 capsule (220 mg total) by mouth daily. 10/12/19   Burnadette Pop, MD    Allergies Patient has no known allergies.  Family History  Problem Relation Age of Onset   Hypertension Father     Social History Social History   Tobacco Use   Smoking status: Some Days    Packs/day: 0.25    Years: 5.00    Pack years: 1.25    Types: Cigarettes   Smokeless tobacco: Never  Vaping Use   Vaping Use: Never used  Substance Use Topics   Alcohol use: Yes   Drug use: No    Review of Systems  Review of Systems  Constitutional:  Positive for fatigue. Negative for chills and fever.  HENT:  Negative for sore throat.   Respiratory:  Positive for cough and wheezing. Negative for shortness of breath.   Cardiovascular:  Negative for chest pain.  Gastrointestinal:  Negative for abdominal pain.  Genitourinary:  Negative for flank pain.  Musculoskeletal:  Positive for myalgias. Negative for neck pain.  Skin:  Negative for rash and wound.  Allergic/Immunologic: Negative for immunocompromised state.  Neurological:  Negative for weakness and numbness.  Hematological:  Does not bruise/bleed easily.  All other systems reviewed and are negative.   ____________________________________________  PHYSICAL EXAM:      VITAL SIGNS: ED Triage Vitals  Enc Vitals Group     BP 10/29/20 1217 (!) 127/91     Pulse Rate 10/29/20 1217 91     Resp 10/29/20 1217 18     Temp 10/29/20 1217 98.4 F (36.9 C)     Temp Source 10/29/20 1217 Oral     SpO2 10/29/20 1217 96 %     Weight 10/29/20 1215 300 lb (136.1 kg)     Height 10/29/20 1215 5\' 11"  (1.803 m)     Head Circumference --      Peak Flow --      Pain Score 10/29/20 1215 8     Pain Loc --      Pain Edu? --      Excl. in GC? --      Physical Exam Vitals and nursing note reviewed.  Constitutional:      General: He is not in acute distress.    Appearance: He is well-developed.  HENT:     Head:  Normocephalic and atraumatic.  Eyes:     Conjunctiva/sclera: Conjunctivae normal.  Cardiovascular:     Rate and Rhythm: Normal rate and regular rhythm.     Heart sounds: Normal heart sounds.  Pulmonary:     Effort: Pulmonary effort is normal. No respiratory distress.     Breath sounds: Examination of the right-middle field reveals wheezing. Examination of the left-middle field reveals wheezing. Examination of the right-lower field reveals wheezing. Examination of the left-lower field reveals wheezing. Wheezing present.  Abdominal:     General: There is no  distension.  Musculoskeletal:     Cervical back: Neck supple.  Skin:    General: Skin is warm.     Capillary Refill: Capillary refill takes less than 2 seconds.     Findings: No rash.  Neurological:     Mental Status: He is alert and oriented to person, place, and time.     Motor: No abnormal muscle tone.      ____________________________________________   LABS (all labs ordered are listed, but only abnormal results are displayed)  Labs Reviewed  RESP PANEL BY RT-PCR (FLU A&B, COVID) ARPGX2    ____________________________________________  EKG: Normal sinus rhythm, ventricular rate 70.  PR 210, QRS 90, QTc 432.  No acute ST elevations or depressions per negative and subacute ischemic infarct. ________________________________________  RADIOLOGY All imaging, including plain films, CT scans, and ultrasounds, independently reviewed by me, and interpretations confirmed via formal radiology reads.  ED MD interpretation:   Chest x-ray: No acute disease  Official radiology report(s): DG Chest 2 View  Result Date: 10/29/2020 CLINICAL DATA:  cough, CP, low energy EXAM: CHEST - 2 VIEW COMPARISON:  June 26, 2020 FINDINGS: Similar cardiomediastinal silhouette, within normal limits. Both lungs are clear. No visible pleural effusions or pneumothorax. No acute osseous abnormality. Possible loose bodies in left shoulder joint,  incompletely imaged. IMPRESSION: No evidence of acute cardiopulmonary disease. Electronically Signed   By: Feliberto Harts M.D.   On: 10/29/2020 13:11    ____________________________________________  PROCEDURES   Procedure(s) performed (including Critical Care):  Procedures  ____________________________________________  INITIAL IMPRESSION / MDM / ASSESSMENT AND PLAN / ED COURSE  As part of my medical decision making, I reviewed the following data within the electronic MEDICAL RECORD NUMBER Nursing notes reviewed and incorporated, Old chart reviewed, Notes from prior ED visits, and [employer] Controlled Substance Database       *Justin Reid was evaluated in Emergency Department on 10/29/2020 for the symptoms described in the history of present illness. He was evaluated in the context of the global COVID-19 pandemic, which necessitated consideration that the patient might be at risk for infection with the SARS-CoV-2 virus that causes COVID-19. Institutional protocols and algorithms that pertain to the evaluation of patients at risk for COVID-19 are in a state of rapid change based on information released by regulatory bodies including the CDC and federal and state organizations. These policies and algorithms were followed during the patient's care in the ED.  Some ED evaluations and interventions may be delayed as a result of limited staffing during the pandemic.*     Medical Decision Making: Well-appearing 40 year old male here with general fatigue, chills, cough, wheezing.  Suspect asthma exacerbation versus bronchitis/viral URI.  Chest x-ray is clear.  He is satting well on room air.  He has normal work of breathing but does have diffuse wheezing.  No evidence of significant respiratory distress.  He is otherwise nontoxic and well-appearing.  He did have some sweating which I suspect is due to fever, do not suspect ACS and EKG is nonischemic with no other anginal equivalent symptoms.  Patient will  be given prednisone, albuterol.  He does have a history of recurrent bronchitis and pneumonia.  He has no sputum production now, but I will give him azithromycin to fill if he begins to have sputum production or fevers or other concerning symptoms.  Return precautions given.  ____________________________________________  FINAL CLINICAL IMPRESSION(S) / ED DIAGNOSES  Final diagnoses:  Upper respiratory tract infection, unspecified type  MEDICATIONS GIVEN DURING THIS VISIT:  Medications  predniSONE (DELTASONE) tablet 60 mg (60 mg Oral Given 10/29/20 1403)  albuterol (VENTOLIN HFA) 108 (90 Base) MCG/ACT inhaler 4 puff (4 puffs Inhalation Given 10/29/20 1444)     ED Discharge Orders          Ordered    predniSONE (DELTASONE) 20 MG tablet  Daily        10/29/20 1443    azithromycin (ZITHROMAX Z-PAK) 250 MG tablet        10/29/20 1443    benzonatate (TESSALON PERLES) 100 MG capsule  3 times daily PRN        10/29/20 1443             Note:  This document was prepared using Dragon voice recognition software and may include unintentional dictation errors.   Shaune Pollack, MD 10/29/20 [phone]

## 2020-11-10 ENCOUNTER — Observation Stay: Payer: Self-pay

## 2020-11-10 ENCOUNTER — Emergency Department: Payer: Self-pay

## 2020-11-10 ENCOUNTER — Inpatient Hospital Stay
Admission: EM | Admit: 2020-11-10 | Discharge: 2020-11-12 | DRG: 202 | Disposition: A | Discharge status: Home / Self Care | Payer: Self-pay | Attending: Student | Admitting: Student

## 2020-11-10 ENCOUNTER — Other Ambulatory Visit: Payer: Self-pay

## 2020-11-10 ENCOUNTER — Encounter: Payer: Self-pay | Admitting: Emergency Medicine

## 2020-11-10 DIAGNOSIS — G4700 Insomnia, unspecified: Secondary | ICD-10-CM | POA: Diagnosis present

## 2020-11-10 DIAGNOSIS — Z8249 Family history of ischemic heart disease and other diseases of the circulatory system: Secondary | ICD-10-CM

## 2020-11-10 DIAGNOSIS — E65 Localized adiposity: Secondary | ICD-10-CM

## 2020-11-10 DIAGNOSIS — I10 Essential (primary) hypertension: Secondary | ICD-10-CM | POA: Diagnosis present

## 2020-11-10 DIAGNOSIS — F17210 Nicotine dependence, cigarettes, uncomplicated: Secondary | ICD-10-CM | POA: Diagnosis present

## 2020-11-10 DIAGNOSIS — Z9189 Other specified personal risk factors, not elsewhere classified: Secondary | ICD-10-CM

## 2020-11-10 DIAGNOSIS — J45901 Unspecified asthma with (acute) exacerbation: Principal | ICD-10-CM | POA: Diagnosis present

## 2020-11-10 DIAGNOSIS — Z8616 Personal history of COVID-19: Secondary | ICD-10-CM

## 2020-11-10 DIAGNOSIS — E662 Morbid (severe) obesity with alveolar hypoventilation: Secondary | ICD-10-CM

## 2020-11-10 DIAGNOSIS — Z2821 Immunization not carried out because of patient refusal: Secondary | ICD-10-CM

## 2020-11-10 DIAGNOSIS — J9601 Acute respiratory failure with hypoxia: Secondary | ICD-10-CM | POA: Diagnosis present

## 2020-11-10 DIAGNOSIS — J45909 Unspecified asthma, uncomplicated: Secondary | ICD-10-CM | POA: Diagnosis present

## 2020-11-10 DIAGNOSIS — Z8701 Personal history of pneumonia (recurrent): Secondary | ICD-10-CM

## 2020-11-10 DIAGNOSIS — R0602 Shortness of breath: Secondary | ICD-10-CM | POA: Diagnosis present

## 2020-11-10 DIAGNOSIS — J4551 Severe persistent asthma with (acute) exacerbation: Secondary | ICD-10-CM | POA: Diagnosis present

## 2020-11-10 DIAGNOSIS — Z20822 Contact with and (suspected) exposure to covid-19: Secondary | ICD-10-CM | POA: Diagnosis present

## 2020-11-10 DIAGNOSIS — G8929 Other chronic pain: Secondary | ICD-10-CM | POA: Diagnosis present

## 2020-11-10 DIAGNOSIS — Z791 Long term (current) use of non-steroidal anti-inflammatories (NSAID): Secondary | ICD-10-CM

## 2020-11-10 DIAGNOSIS — Z28310 Unvaccinated for covid-19: Secondary | ICD-10-CM

## 2020-11-10 DIAGNOSIS — Z6841 Body Mass Index (BMI) 40.0 and over, adult: Secondary | ICD-10-CM

## 2020-11-10 DIAGNOSIS — M1990 Unspecified osteoarthritis, unspecified site: Secondary | ICD-10-CM | POA: Diagnosis present

## 2020-11-10 DIAGNOSIS — Z79899 Other long term (current) drug therapy: Secondary | ICD-10-CM

## 2020-11-10 HISTORY — DX: Immunization not carried out because of patient refusal: Z28.21

## 2020-11-10 LAB — TROPONIN I (HIGH SENSITIVITY)
Troponin I (High Sensitivity): 5 ng/L (ref <18)
Troponin I (High Sensitivity): 3 ng/L (ref <18)

## 2020-11-10 LAB — BASIC METABOLIC PANEL
Anion gap: 10 (ref 5–15)
BUN: 11 mg/dL (ref 6–20)
CO2: 25 mmol/L (ref 22–32)
Calcium: 9.1 mg/dL (ref 8.9–10.3)
Chloride: 101 mmol/L (ref 98–111)
Creatinine, Ser: 0.86 mg/dL (ref 0.61–1.24)
GFR, Estimated: >60 mL/min (ref >60)
Glucose, Bld: 104 mg/dL — ABNORMAL HIGH (ref 70–99)
Potassium: 3.9 mmol/L (ref 3.5–5.1)
Sodium: 136 mmol/L (ref 135–145)

## 2020-11-10 LAB — CBC
HCT: 41.9 % (ref 39.0–52.0)
Hemoglobin: 13.2 g/dL (ref 13.0–17.0)
MCH: 26.8 pg (ref 26.0–34.0)
MCHC: 31.5 g/dL (ref 30.0–36.0)
MCV: 85 fL (ref 80.0–100.0)
Platelets: 414 10*3/uL — ABNORMAL HIGH (ref 150–400)
RBC: 4.93 MIL/uL (ref 4.22–5.81)
RDW: 14.4 % (ref 11.5–15.5)
WBC: 7.5 10*3/uL (ref 4.0–10.5)
nRBC: 0 % (ref 0.0–0.2)

## 2020-11-10 LAB — RESP PANEL BY RT-PCR (FLU A&B, COVID) ARPGX2
Influenza A by PCR: NEGATIVE
Influenza B by PCR: NEGATIVE
SARS Coronavirus 2 by RT PCR: NEGATIVE

## 2020-11-10 LAB — PROCALCITONIN: Procalcitonin: <0.1 ng/mL

## 2020-11-10 LAB — D-DIMER, QUANTITATIVE: D-Dimer, Quant: 0.73 ug/mL-FEU — ABNORMAL HIGH (ref 0.00–0.50)

## 2020-11-10 LAB — HIV ANTIBODY (ROUTINE TESTING W REFLEX): HIV Screen 4th Generation wRfx: NONREACTIVE

## 2020-11-10 MED ORDER — MELATONIN 5 MG PO TABS
5.0000 mg | ORAL_TABLET | Freq: Every day | ORAL | Status: DC
  Administered 2020-11-10 – 2020-11-11 (×2): 5 mg via ORAL
  Filled 2020-11-10 (×2): qty 1

## 2020-11-10 MED ORDER — ACETAMINOPHEN 650 MG RE SUPP: 650.0000 mg | Freq: Four times a day (QID) | RECTAL | Status: DC | PRN

## 2020-11-10 MED ORDER — IPRATROPIUM-ALBUTEROL 0.5-2.5 (3) MG/3ML IN SOLN
3.0000 mL | Freq: Three times a day (TID) | RESPIRATORY_TRACT | Status: DC
  Administered 2020-11-10 – 2020-11-11 (×3): 3 mL via RESPIRATORY_TRACT
  Filled 2020-11-10 (×4): qty 3

## 2020-11-10 MED ORDER — DM-GUAIFENESIN ER 30-600 MG PO TB12
1.0000 | ORAL_TABLET | Freq: Two times a day (BID) | ORAL | Status: AC
  Administered 2020-11-10 – 2020-11-12 (×4): 1 via ORAL
  Filled 2020-11-10 (×4): qty 1

## 2020-11-10 MED ORDER — MAGNESIUM SULFATE 2 GM/50ML IV SOLN
2.0000 g | INTRAVENOUS | Status: AC
  Administered 2020-11-10: 2 g via INTRAVENOUS
  Filled 2020-11-10: qty 50

## 2020-11-10 MED ORDER — HYDROCOD POLST-CPM POLST ER 10-8 MG/5ML PO SUER
5.0000 mL | Freq: Every day | ORAL | Status: DC
  Administered 2020-11-10 – 2020-11-11 (×2): 5 mL via ORAL
  Filled 2020-11-10 (×2): qty 5

## 2020-11-10 MED ORDER — BENZONATATE 100 MG PO CAPS
100.0000 mg | ORAL_CAPSULE | Freq: Three times a day (TID) | ORAL | Status: DC | PRN
  Administered 2020-11-10 – 2020-11-12 (×4): 100 mg via ORAL
  Filled 2020-11-10 (×4): qty 1

## 2020-11-10 MED ORDER — METHYLPREDNISOLONE SODIUM SUCC 40 MG IJ SOLR
40.0000 mg | Freq: Every day | INTRAMUSCULAR | Status: AC
  Administered 2020-11-11: 40 mg via INTRAVENOUS
  Filled 2020-11-10: qty 1

## 2020-11-10 MED ORDER — ACETAMINOPHEN 325 MG PO TABS: 650.0000 mg | ORAL_TABLET | Freq: Four times a day (QID) | ORAL | Status: DC | PRN

## 2020-11-10 MED ORDER — LIDOCAINE 5 % EX PTCH
2.0000 | MEDICATED_PATCH | CUTANEOUS | Status: DC
  Administered 2020-11-11: 2 via TRANSDERMAL
  Filled 2020-11-10 (×4): qty 2

## 2020-11-10 MED ORDER — IPRATROPIUM-ALBUTEROL 0.5-2.5 (3) MG/3ML IN SOLN
3.0000 mL | Freq: Once | RESPIRATORY_TRACT | Status: DC
  Filled 2020-11-10: qty 3

## 2020-11-10 MED ORDER — METHYLPREDNISOLONE SODIUM SUCC 125 MG IJ SOLR
125.0000 mg | INTRAMUSCULAR | Status: AC
  Administered 2020-11-10: 125 mg via INTRAVENOUS
  Filled 2020-11-10: qty 2

## 2020-11-10 MED ORDER — ALBUTEROL SULFATE (2.5 MG/3ML) 0.083% IN NEBU
2.5000 mg | INHALATION_SOLUTION | RESPIRATORY_TRACT | Status: DC | PRN
  Administered 2020-11-11: 2.5 mg via RESPIRATORY_TRACT
  Filled 2020-11-10: qty 3

## 2020-11-10 MED ORDER — ALBUTEROL SULFATE (2.5 MG/3ML) 0.083% IN NEBU
5.0000 mg | INHALATION_SOLUTION | Freq: Once | RESPIRATORY_TRACT | Status: AC
  Administered 2020-11-10: 5 mg via RESPIRATORY_TRACT
  Filled 2020-11-10: qty 6

## 2020-11-10 MED ORDER — IPRATROPIUM-ALBUTEROL 0.5-2.5 (3) MG/3ML IN SOLN
3.0000 mL | Freq: Once | RESPIRATORY_TRACT | Status: AC
  Administered 2020-11-10: 3 mL via RESPIRATORY_TRACT
  Filled 2020-11-10: qty 3

## 2020-11-10 MED ORDER — ENOXAPARIN SODIUM 80 MG/0.8ML IJ SOSY
0.5000 mg/kg | PREFILLED_SYRINGE | INTRAMUSCULAR | Status: DC
  Administered 2020-11-10 – 2020-11-11 (×2): 67.5 mg via SUBCUTANEOUS
  Filled 2020-11-10 (×3): qty 0.68

## 2020-11-10 MED ORDER — HYDROXYZINE HCL 25 MG PO TABS
25.0000 mg | ORAL_TABLET | Freq: Three times a day (TID) | ORAL | Status: DC | PRN
  Filled 2020-11-10: qty 1

## 2020-11-10 MED ORDER — SODIUM CHLORIDE 0.9 % IV SOLN
500.0000 mg | INTRAVENOUS | Status: DC
  Administered 2020-11-10 – 2020-11-11 (×2): 500 mg via INTRAVENOUS
  Filled 2020-11-10 (×3): qty 500

## 2020-11-10 MED ORDER — ONDANSETRON HCL 4 MG/2ML IJ SOLN: 4.0000 mg | Freq: Four times a day (QID) | INTRAMUSCULAR | Status: DC | PRN

## 2020-11-10 MED ORDER — ONDANSETRON HCL 4 MG PO TABS: 4.0000 mg | ORAL_TABLET | Freq: Four times a day (QID) | ORAL | Status: DC | PRN

## 2020-11-10 MED ORDER — AMLODIPINE BESYLATE 10 MG PO TABS
10.0000 mg | ORAL_TABLET | Freq: Every day | ORAL | Status: DC
  Administered 2020-11-10 – 2020-11-12 (×3): 10 mg via ORAL
  Filled 2020-11-10 (×3): qty 1

## 2020-11-10 MED ORDER — IOHEXOL 350 MG/ML SOLN
75.0000 mL | Freq: Once | INTRAVENOUS | Status: AC | PRN
  Administered 2020-11-10: 75 mL via INTRAVENOUS

## 2020-11-10 NOTE — H&P (Addendum)
History and Physical   Justin Reid [id number] DOB: 03-22-1980 DOA: 11/10/2020  PCP: Pcp, No  Patient coming from: Home via [place]  I have personally briefly reviewed patient's old medical records in [place].  Chief Concern: Cough, shortness of breath  HPI: Justin Reid is a 40 y.o. male with medical history significant for asthma, history of chronic pain with chronic opioid prescription, currently not on opoid medications,who presents emergency department for chief concerns of shortness of breath and a cough.  He reports the shortness of breath worse has been ongoing and worsening in the last 2 days.  He states that the shortness of breath is worse when he lays flat and with exertion. He denies fever, known sick contacts.   He endorses new cough, productive light yellow, that is worse at night and prevents him from sleep.  He reports that the inhalers and breathing treatments have mild to moderately improved his symptoms.  He reports that this episode is similar to his prior presentation of asthma exacerbation.  He states he has been hospitalized for aspirate exacerbation for 7 days in the past before.  He reports he does not have a primary care provider and does not have a maintenance inhaler for asthma.  He takes his albuterol daily due to daily needed.  He also reports chest pain, specifically bilateral lateral chest pain with coughing.  Social history: He lives at home with his wife.  He is an infrequent tobacco user, smoking socially.  He endorses infrequent EtOH use, about 1 to 2 glasses of wine per month.  He denies recreational drug use.  He currently works at [employer].  Vaccination history: Patient is not vaccinated for COVID-19.  Patient declined COVID-19 vaccination.  He has a history of COVID-19 pneumonia.  ROS: Constitutional: no weight change, no fever ENT/Mouth: no sore throat, no rhinorrhea Eyes: no eye pain, no vision changes Cardiovascular: no  chest pain, + dyspnea,  no edema, no palpitations Respiratory: + cough, + sputum, + wheezing Gastrointestinal: no nausea, no vomiting, + diarrhea, no constipation Genitourinary: no urinary incontinence, no dysuria, no hematuria Musculoskeletal: no arthralgias, no myalgias Skin: no skin lesions, no pruritus, Neuro: no weakness, no loss of consciousness, no syncope, + dizziness Psych: no anxiety, no depression, + decrease appetite Heme/Lymph: no bruising, no bleeding  ED Course: Discussed with ED provider, patient requiring hospitalization for chief concerns of asthma exacerbation.  Vitals in the emergency department was remarkable for temperature of 97.9, respiration rate of 26, heart rate of 88, blood pressure 148/120, SPO2 of 92% on room air.  Labs in the emergency department was remarkable for sodium 136, potassium 3.9, chloride 101, bicarb 25, BUN of 11, serum creatinine of 0.86, nonfasting blood glucose 104, GFR greater than 60, WBC 7.5, hemoglobin 13.2, platelets is 414.  High-sensitivity troponin was ordered and showed a 5.  COVID/influenza A/influenza B PCR were negative.  In the emergency department patient received 2 doses of DuoNeb treatment, Solu-Medrol 125 mg IV, one-time dose of albuterol, and magnesium IV.  Assessment/Plan  Principal Problem:   Shortness of breath Active Problems:   Asthma exacerbation   Asthma   At risk for sleep apnea   Obesity hypoventilation syndrome (HCC)   Truncal obesity   2019-nCoV vaccination declined   # Shortness of breath and cough-presumed secondary to asthma exacerbation in setting of no asthma maintenance medication # Chronic persistent asthma with asthma exacerbation, moderate to severe - DuoNebs 3 times daily scheduled ordered,  4 doses ordered - Solu-Medrol 40 mg IV ordered for 11/11/2020 - We will follow-up with high sensitive troponin, low clinical suspicion for ACS at this time - Check D-dimer, which is mildly elevated - If  patient symptoms do not improve on 11/11/2020, a.m. team to consider CTA to assess for pulmonary embolism  # Cough # Insomnia - Mucinex twice daily during the day - Tussionex scheduled nightly, 3 days ordered for sleep and increased ability of body to heal  # Patient is at risk for obstructive sleep apnea with body habitus - CPAP nightly ordered  # Infrequent tobacco use-counseled patient on complete tobacco cessation  Chart reviewed.   DVT prophylaxis: Enoxaparin nightly Code Status: Full code Diet: Heart healthy Family Communication: No, his spouse knows he is in the hospital for asthma Disposition Plan: Pending clinical course Consults called: None at this time Admission status: MedSurg, observation, telemetry  Past Medical History:  Diagnosis Date   Arthritis    Asthma    Hypertension    History reviewed. No pertinent surgical history.  Social History:  reports that he has been smoking cigarettes. He has a 1.25 pack-year smoking history. He has never used smokeless tobacco. He reports current alcohol use. He reports that he does not use drugs.  No Known Allergies Family History  Problem Relation Age of Onset   Hypertension Father    Family history: Family history reviewed and not pertinent  Prior to Admission medications   Medication Sig Start Date End Date Taking? Authorizing Provider  albuterol (PROVENTIL) (2.5 MG/3ML) 0.083% nebulizer solution Take 3 mLs (2.5 mg total) by nebulization every 4 (four) hours as needed for wheezing or shortness of breath. 06/26/20 06/26/21  Joni Reining, PA-C  albuterol (VENTOLIN HFA) 108 (90 Base) MCG/ACT inhaler Inhale 2 puffs into the lungs every 4 (four) hours as needed for wheezing or shortness of breath.    [provider]  albuterol (VENTOLIN HFA) 108 (90 Base) MCG/ACT inhaler Inhale 2 puffs into the lungs every 6 (six) hours as needed for wheezing or shortness of breath. 06/26/20   Joni Reining, PA-C  allopurinol  (ZYLOPRIM) 300 MG tablet Take 1 tablet (300 mg total) by mouth daily. 10/08/20 10/08/21  Fisher, Roselyn Bering, PA-C  amLODipine (NORVASC) 10 MG tablet Take 1 tablet (10 mg total) by mouth daily. 10/11/19 10/10/20  Burnadette Pop, MD  ascorbic acid (VITAMIN C) 500 MG tablet Take 1 tablet (500 mg total) by mouth daily. 10/12/19   Burnadette Pop, MD  azithromycin (ZITHROMAX Z-PAK) 250 MG tablet Take 2 tablets (500 mg) on  Day 1,  followed by 1 tablet (250 mg) once daily on Days 2 through 5. 10/29/20   Shaune Pollack, MD  benzonatate (TESSALON PERLES) 100 MG capsule Take 1 capsule (100 mg total) by mouth 3 (three) times daily as needed for cough. 10/29/20 10/29/21  Shaune Pollack, MD  colchicine 0.6 MG tablet Take 1 tablet (0.6 mg total) by mouth 2 (two) times daily. 10/08/20 10/08/21  Fisher, Roselyn Bering, PA-C  HYDROcodone-acetaminophen (NORCO/VICODIN) 5-325 MG tablet Take 1 tablet by mouth every 8 (eight) hours as needed for moderate pain. 09/14/20 09/14/21  Lorre Munroe, NP  hydrOXYzine (ATARAX/VISTARIL) 25 MG tablet Take 1 tablet (25 mg total) by mouth 3 (three) times daily as needed for anxiety. 11/07/19   Delton Prairie, MD  meloxicam (MOBIC) 15 MG tablet Take 1 tablet (15 mg total) by mouth daily. 05/08/20 05/08/21  Sherrie Mustache Roselyn Bering, PA-C  oxyCODONE-acetaminophen (PERCOCET) [phone]  MG tablet Take 1 tablet by mouth every 4 (four) hours as needed for severe pain. 10/08/20 10/08/21  Fisher, Linden Dolin, PA-C  phenylephrine-shark liver oil-mineral oil-petrolatum (PREPARATION H) 0.25-14-74.9 % rectal ointment Place 1 application rectally 2 (two) times daily as needed for hemorrhoids. 10/12/19   Lavonia Drafts, MD  zinc sulfate 220 (50 Zn) MG capsule Take 1 capsule (220 mg total) by mouth daily. 10/12/19   Shelly Coss, MD   Physical Exam: Vitals:   11/10/20 1200 11/10/20 1300 11/10/20 1406 11/10/20 1500  BP: (!) 147/102 131/87 (!) 143/102 (!) 149/95  Pulse: 62 66 70 77  Resp: 17 20 20    Temp:      TempSrc:      SpO2: 91%  96% 95% 95%  Height:       Constitutional: appears age-appropriate, NAD, calm, comfortable Eyes: PERRL, lids and conjunctivae normal ENMT: Mucous membranes are moist. Posterior pharynx clear of any exudate or lesions. Age-appropriate dentition. Hearing appropriate Neck: normal, supple, no masses, no thyromegaly Respiratory: Bilateral decreased lung sounds.  Bilateral diffuse wheezing.  No crackles.  Increased respiratory effort.  Positive for accessory muscle use.  Cardiovascular: Regular rate and rhythm, no murmurs / rubs / gallops. No extremity edema. 2+ pedal pulses. No carotid bruits.  Abdomen: Obese abdomen, no tenderness, no masses palpated, no hepatosplenomegaly. Bowel sounds positive.  Musculoskeletal: no clubbing / cyanosis. No joint deformity upper and lower extremities. Good ROM, no contractures, no atrophy. Normal muscle tone.  Reproducible chest discomfort. Skin: no rashes, lesions, ulcers. No induration Neurologic: Sensation intact. Strength 5/5 in all 4.  Psychiatric: Normal judgment and insight. Alert and oriented x 3. Normal mood.   EKG: independently reviewed, showing sinus rhythm with rate of 91, QTc 447  Chest x-ray on Admission: I personally reviewed and I agree with radiologist reading as below.  DG Chest Portable 1 View  Result Date: 11/10/2020 CLINICAL DATA:  Cough, shortness of breath. Additional history provided: Patient reports cough, shortness of breath, chest pain, wheezing. Cough productive of yellow sputum. EXAM: PORTABLE CHEST 1 VIEW COMPARISON:  Prior chest radiographs 10/29/2020 and earlier. FINDINGS: Shallow inspiration radiograph. Heart size within normal limits. No appreciable airspace consolidation. No evidence of pleural effusion or pneumothorax. No acute bony abnormality identified. Degenerative changes of the spine. IMPRESSION: Shallow inspiration radiograph. No evidence of acute cardiopulmonary abnormality. Electronically Signed   By: Kellie Simmering D.O.    On: 11/10/2020 11:51    Labs on Admission: I have personally reviewed following labs  CBC: Recent Labs  Lab 11/10/20 1053  WBC 7.5  HGB 13.2  HCT 41.9  MCV 85.0  PLT [id number]*   Basic Metabolic Panel: Recent Labs  Lab 11/10/20 1053  NA 136  K 3.9  CL 101  CO2 25  GLUCOSE 104*  BUN 11  CREATININE 0.86  CALCIUM 9.1   GFR: Estimated Creatinine Clearance: 164.1 mL/min (by C-G formula based on SCr of 0.86 mg/dL).  Dr. Tobie Poet [facility]  If 7PM-7AM, please contact overnight-coverage provider If 7AM-7PM, please contact day coverage provider [email or web address]  11/10/2020, 3:27 PM

## 2020-11-10 NOTE — ED Notes (Signed)
Secure msg sent to Mariah Milling, LPN for ED to IP SBAR

## 2020-11-10 NOTE — ED Notes (Signed)
Informed RN bed assigned 

## 2020-11-10 NOTE — ED Notes (Signed)
Per Dr. Sedalia Muta, pt placed on O2 via [place] at 2L/min.

## 2020-11-10 NOTE — ED Provider Notes (Signed)
[facility] Emergency Department Provider Note  ____________________________________________  Time seen: Approximately 11:00 AM  I have reviewed the triage vital signs and the nursing notes.   HISTORY  Chief Complaint Cough, Shortness of Breath, and Chest Pain    HPI Justin Reid is a 40 y.o. male with a history of asthma and hypertension who comes ED complaining of shortness of breath for the past 2 days.  Associated with productive cough.  He has tried his inhaler at home without relief.  Symptoms are severe, worse with walking, no alleviating factors.  He has some generalized chest discomfort which is not exertional, not pleuritic.  Feels like tightness, not sharp or tearing.    Past Medical History:  Diagnosis Date   Arthritis    Asthma    Hypertension      Patient Active Problem List   Diagnosis Date Noted   Acute respiratory failure with hypoxia (HCC)    Hyperkalemia    Pneumonia due to COVID-19 virus 10/07/2019   Hypokalemia    Hyponatremia    Acute midline thoracic back pain    Sepsis (HCC) 12/24/2017   Asthma 07/07/2016   Asthma exacerbation 02/20/2016     History reviewed. No pertinent surgical history.   Prior to Admission medications   Medication Sig Start Date End Date Taking? Authorizing Provider  albuterol (PROVENTIL) (2.5 MG/3ML) 0.083% nebulizer solution Take 3 mLs (2.5 mg total) by nebulization every 4 (four) hours as needed for wheezing or shortness of breath. 06/26/20 06/26/21  Joni Reining, PA-C  albuterol (VENTOLIN HFA) 108 (90 Base) MCG/ACT inhaler Inhale 2 puffs into the lungs every 4 (four) hours as needed for wheezing or shortness of breath.    [provider]  albuterol (VENTOLIN HFA) 108 (90 Base) MCG/ACT inhaler Inhale 2 puffs into the lungs every 6 (six) hours as needed for wheezing or shortness of breath. 06/26/20   Joni Reining, PA-C  allopurinol (ZYLOPRIM) 300 MG tablet Take 1 tablet (300 mg  total) by mouth daily. 10/08/20 10/08/21  Fisher, Roselyn Bering, PA-C  amLODipine (NORVASC) 10 MG tablet Take 1 tablet (10 mg total) by mouth daily. 10/11/19 10/10/20  Burnadette Pop, MD  ascorbic acid (VITAMIN C) 500 MG tablet Take 1 tablet (500 mg total) by mouth daily. 10/12/19   Burnadette Pop, MD  azithromycin (ZITHROMAX Z-PAK) 250 MG tablet Take 2 tablets (500 mg) on  Day 1,  followed by 1 tablet (250 mg) once daily on Days 2 through 5. 10/29/20   Shaune Pollack, MD  benzonatate (TESSALON PERLES) 100 MG capsule Take 1 capsule (100 mg total) by mouth 3 (three) times daily as needed for cough. 10/29/20 10/29/21  Shaune Pollack, MD  colchicine 0.6 MG tablet Take 1 tablet (0.6 mg total) by mouth 2 (two) times daily. 10/08/20 10/08/21  Fisher, Roselyn Bering, PA-C  HYDROcodone-acetaminophen (NORCO/VICODIN) 5-325 MG tablet Take 1 tablet by mouth every 8 (eight) hours as needed for moderate pain. 09/14/20 09/14/21  Lorre Munroe, NP  hydrOXYzine (ATARAX/VISTARIL) 25 MG tablet Take 1 tablet (25 mg total) by mouth 3 (three) times daily as needed for anxiety. 11/07/19   Delton Prairie, MD  meloxicam (MOBIC) 15 MG tablet Take 1 tablet (15 mg total) by mouth daily. 05/08/20 05/08/21  Fisher, Roselyn Bering, PA-C  oxyCODONE-acetaminophen (PERCOCET) 5-325 MG tablet Take 1 tablet by mouth every 4 (four) hours as needed for severe pain. 10/08/20 10/08/21  Fisher, Roselyn Bering, PA-C  phenylephrine-shark liver oil-mineral oil-petrolatum (PREPARATION H)  0.25-14-74.9 % rectal ointment Place 1 application rectally 2 (two) times daily as needed for hemorrhoids. 10/12/19   Jene Every, MD  zinc sulfate 220 (50 Zn) MG capsule Take 1 capsule (220 mg total) by mouth daily. 10/12/19   Burnadette Pop, MD     Allergies Patient has no known allergies.   Family History  Problem Relation Age of Onset   Hypertension Father     Social History Social History   Tobacco Use   Smoking status: Some Days    Packs/day: 0.25    Years: 5.00    Pack years:  1.25    Types: Cigarettes   Smokeless tobacco: Never  Vaping Use   Vaping Use: Never used  Substance Use Topics   Alcohol use: Yes   Drug use: No    Review of Systems  Constitutional:   No fever or chills.  ENT:   No sore throat. No rhinorrhea. Cardiovascular:   No chest pain or syncope. Respiratory:   Positive shortness of breath and cough. Gastrointestinal:   Negative for abdominal pain, vomiting and diarrhea.  Musculoskeletal:   Negative for focal pain or swelling All other systems reviewed and are negative except as documented above in ROS and HPI.  ____________________________________________   PHYSICAL EXAM:  VITAL SIGNS: ED Triage Vitals [11/10/20 1051]  Enc Vitals Group     BP (!) 148/120     Pulse Rate 88     Resp (!) 26     Temp 97.9 F (36.6 C)     Temp Source Oral     SpO2 92 %     Weight      Height 5\' 11"  (1.803 m)     Head Circumference      Peak Flow      Pain Score 10     Pain Loc      Pain Edu?      Excl. in GC?     Vital signs reviewed, nursing assessments reviewed.   Constitutional:   Alert and oriented. Non-toxic appearance. Eyes:   Conjunctivae are normal. EOMI. PERRL. ENT      Head:   Normocephalic and atraumatic.      Nose:   Wearing a mask.      Mouth/Throat:   Wearing a mask.      Neck:   No meningismus. Full ROM. Hematological/Lymphatic/Immunilogical:   No cervical lymphadenopathy. Cardiovascular:   RRR. Symmetric bilateral radial and DP pulses.  No murmurs. Cap refill less than 2 seconds. Respiratory:   Tachypnea, respiratory rate of 25.  Diffuse expiratory wheezing, prolonged expiratory phase.  No focal crackles.  Symmetric air movement in all lung fields.. Gastrointestinal:   Soft and nontender. Non distended. There is no CVA tenderness.  No rebound, rigidity, or guarding. Genitourinary:   deferred Musculoskeletal:   Normal range of motion in all extremities. No joint effusions.  No lower extremity tenderness.  No edema.  No  calf tenderness. Neurologic:   Normal speech and language.  Motor grossly intact. No acute focal neurologic deficits are appreciated.  Skin:    Skin is warm, dry and intact. No rash noted.  No petechiae, purpura, or bullae.  ____________________________________________    LABS (pertinent positives/negatives) (all labs ordered are listed, but only abnormal results are displayed) Labs Reviewed  BASIC METABOLIC PANEL - Abnormal; Notable for the following components:      Result Value   Glucose, Bld 104 (*)    All other components within normal limits  CBC -  Abnormal; Notable for the following components:   Platelets 414 (*)    All other components within normal limits  RESP PANEL BY RT-PCR (FLU A&B, COVID) ARPGX2  TROPONIN I (HIGH SENSITIVITY)  TROPONIN I (HIGH SENSITIVITY)   ____________________________________________   EKG  Interpreted by me Sinus rhythm rate of 91.  Normal axis and intervals.  Poor R wave progression.  Normal ST segments and T waves.  No ischemic changes.  ____________________________________________    RADIOLOGY  DG Chest Portable 1 View  Result Date: 11/10/2020 CLINICAL DATA:  Cough, shortness of breath. Additional history provided: Patient reports cough, shortness of breath, chest pain, wheezing. Cough productive of yellow sputum. EXAM: PORTABLE CHEST 1 VIEW COMPARISON:  Prior chest radiographs 10/29/2020 and earlier. FINDINGS: Shallow inspiration radiograph. Heart size within normal limits. No appreciable airspace consolidation. No evidence of pleural effusion or pneumothorax. No acute bony abnormality identified. Degenerative changes of the spine. IMPRESSION: Shallow inspiration radiograph. No evidence of acute cardiopulmonary abnormality. Electronically Signed   By: Jackey Loge D.O.   On: 11/10/2020 11:51    ____________________________________________   PROCEDURES Procedures  ____________________________________________  DIFFERENTIAL  DIAGNOSIS   Pneumonia, COVID/flu, asthma exacerbation, pulmonary edema  CLINICAL IMPRESSION / ASSESSMENT AND PLAN / ED COURSE  Medications ordered in the ED: Medications  ipratropium-albuterol (DUONEB) 0.5-2.5 (3) MG/3ML nebulizer solution 3 mL (3 mLs Nebulization Given 11/10/20 1059)  methylPREDNISolone sodium succinate (SOLU-MEDROL) 125 mg/2 mL injection 125 mg (125 mg Intravenous Given 11/10/20 1211)  magnesium sulfate IVPB 2 g 50 mL (0 g Intravenous Stopped 11/10/20 1322)  ipratropium-albuterol (DUONEB) 0.5-2.5 (3) MG/3ML nebulizer solution 3 mL (3 mLs Nebulization Given 11/10/20 1214)  albuterol (PROVENTIL) (2.5 MG/3ML) 0.083% nebulizer solution 5 mg (5 mg Nebulization Given 11/10/20 1214)    Pertinent labs & imaging results that were available during my care of the patient were reviewed by me and considered in my medical decision making (see chart for details).  Justin Reid was evaluated in Emergency Department on 11/10/2020 for the symptoms described in the history of present illness. He was evaluated in the context of the global COVID-19 pandemic, which necessitated consideration that the patient might be at risk for infection with the SARS-CoV-2 virus that causes COVID-19. Institutional protocols and algorithms that pertain to the evaluation of patients at risk for COVID-19 are in a state of rapid change based on information released by regulatory bodies including the CDC and federal and state organizations. These policies and algorithms were followed during the patient's care in the ED.   Patient presents with severe shortness of breath, wheezing prolonged expiratory phase.  Not hypoxic at rest, but with severity of symptoms will give additional nebs, Solu-Medrol, IV magnesium and reassess.  Clinical Course as of 11/10/20 1411  Mon Nov 10, 2020  1404 Persistent severe sob and wheeze. Will admit.  [PS]    Clinical Course User Index [PS] Sharman Cheek, MD     ----------------------------------------- 2:08 PM on 11/10/2020 ----------------------------------------- Serum labs, COVID and flu, chest x-ray all unremarkable.   ____________________________________________   FINAL CLINICAL IMPRESSION(S) / ED DIAGNOSES    Final diagnoses:  Severe asthma with exacerbation, unspecified whether persistent  Morbid obesity [facility])     ED Discharge Orders     None       Portions of this note were generated with dragon dictation software. Dictation errors may occur despite best attempts at proofreading.    Sharman Cheek, MD 11/10/20 [phone]

## 2020-11-10 NOTE — ED Triage Notes (Signed)
Pt report that he has had a cough, SHOB and chest pain. He has audible wheezing. He reports that the chest pain is moving around to his back. He has a productive cough that is yellow in color

## 2020-11-11 DIAGNOSIS — R0602 Shortness of breath: Secondary | ICD-10-CM | POA: Diagnosis present

## 2020-11-11 LAB — BASIC METABOLIC PANEL
Anion gap: 9 (ref 5–15)
BUN: 15 mg/dL (ref 6–20)
CO2: 24 mmol/L (ref 22–32)
Calcium: 8.9 mg/dL (ref 8.9–10.3)
Chloride: 103 mmol/L (ref 98–111)
Creatinine, Ser: 0.88 mg/dL (ref 0.61–1.24)
GFR, Estimated: >60 mL/min (ref >60)
Glucose, Bld: 128 mg/dL — ABNORMAL HIGH (ref 70–99)
Potassium: 4.3 mmol/L (ref 3.5–5.1)
Sodium: 136 mmol/L (ref 135–145)

## 2020-11-11 LAB — MAGNESIUM: Magnesium: 2.5 mg/dL — ABNORMAL HIGH (ref 1.7–2.4)

## 2020-11-11 LAB — CBC
HCT: 40 % (ref 39.0–52.0)
Hemoglobin: 12.5 g/dL — ABNORMAL LOW (ref 13.0–17.0)
MCH: 26.8 pg (ref 26.0–34.0)
MCHC: 31.3 g/dL (ref 30.0–36.0)
MCV: 85.8 fL (ref 80.0–100.0)
Platelets: 417 10*3/uL — ABNORMAL HIGH (ref 150–400)
RBC: 4.66 MIL/uL (ref 4.22–5.81)
RDW: 14.4 % (ref 11.5–15.5)
WBC: 12.2 10*3/uL — ABNORMAL HIGH (ref 4.0–10.5)
nRBC: 0 % (ref 0.0–0.2)

## 2020-11-11 LAB — PHOSPHORUS: Phosphorus: 3.3 mg/dL (ref 2.5–4.6)

## 2020-11-11 MED ORDER — FLUTICASONE FUROATE-VILANTEROL 200-25 MCG/ACT IN AEPB
1.0000 | INHALATION_SPRAY | Freq: Every day | RESPIRATORY_TRACT | Status: DC
  Administered 2020-11-11 – 2020-11-12 (×2): 1 via RESPIRATORY_TRACT
  Filled 2020-11-11: qty 28

## 2020-11-11 MED ORDER — PANTOPRAZOLE SODIUM 40 MG PO TBEC
40.0000 mg | DELAYED_RELEASE_TABLET | Freq: Two times a day (BID) | ORAL | Status: DC
  Administered 2020-11-11 – 2020-11-12 (×2): 40 mg via ORAL
  Filled 2020-11-11 (×2): qty 1

## 2020-11-11 MED ORDER — METHYLPREDNISOLONE SODIUM SUCC 40 MG IJ SOLR
40.0000 mg | Freq: Three times a day (TID) | INTRAMUSCULAR | Status: DC
  Administered 2020-11-11 – 2020-11-12 (×3): 40 mg via INTRAVENOUS
  Filled 2020-11-11 (×3): qty 1

## 2020-11-11 MED ORDER — IPRATROPIUM-ALBUTEROL 0.5-2.5 (3) MG/3ML IN SOLN
3.0000 mL | Freq: Four times a day (QID) | RESPIRATORY_TRACT | Status: AC
  Administered 2020-11-11 – 2020-11-12 (×3): 3 mL via RESPIRATORY_TRACT
  Filled 2020-11-11 (×3): qty 3

## 2020-11-11 NOTE — Progress Notes (Signed)
[facility] Progress Note  Patient: Justin Reid    KGM:[id number]  DOA: 11/10/2020     Date of Service: the patient was seen and examined on 11/11/2020  Chief Complaint  Patient presents with   Cough   Shortness of Breath   Chest Pain   Brief hospital course: Justin Reid is a 40 y.o. male with medical history significant for asthma, history of chronic pain with chronic opioid prescription, currently not on opoid medications,who presents emergency department for chief concerns of shortness of breath and a cough.   He reports the shortness of breath worse has been ongoing and worsening in the last 2 days.  He states that the shortness of breath is worse when he lays flat and with exertion. He denies fever, known sick contacts.    He endorses new cough, productive light yellow, that is worse at night and prevents him from sleep.  He reports that the inhalers and breathing treatments have mild to moderately improved his symptoms.  He reports that this episode is similar to his prior presentation of asthma exacerbation.  He states he has been hospitalized for aspirate exacerbation for 7 days in the past before.   He reports he does not have a primary care provider and does not have a maintenance inhaler for asthma.  He takes his albuterol daily due to daily needed.   He also reports chest pain, specifically bilateral lateral chest pain with coughing.   Assessment and Plan: Principal Problem:   Shortness of breath Active Problems:   Asthma exacerbation   Asthma   At risk for sleep apnea   Obesity hypoventilation syndrome (HCC)   Truncal obesity   2019-nCoV vaccination declined   # Acute hypoxic respiratory failure asthma exacerbation, presented with shortness of breath and cough  # Chronic persistent asthma with asthma exacerbation, moderate to severe --Started Breo Ellipta inhaler daily --Started DuoNeb every 6 hourly scheduled, transition to as needed after improvement,  continue --albuterol every 4 hourly as needed as well due to severe shortness of breath - Solu-Medrol 40 mg IV every 8 hourly, started PPI for GI prophylaxis - D-dimer mildly elevated, CTA negative for PE -- Mucinex twice daily, Tussionex scheduled nightly, 3 days ordered for sleep and increased ability of body to heal Continue prophylactic antibiotic azithromycin COVID-19 and flu negative Follow respiratory viral panel   #Hypertension, continued amlodipine  # Patient is at risk for obstructive sleep apnea with body habitus - CPAP nightly ordered   # Infrequent tobacco use-counseled patient on complete tobacco cessation    Body mass index is 41.85 kg/m.  Interventions:   Diet: Heart healthy diet DVT Prophylaxis: Subcutaneous Lovenox   Advance goals of care discussion: Full code  Family Communication: family was NOT present at bedside, at the time of interview.  The pt provided permission to discuss medical plan with the family. Opportunity was given to ask question and all questions were answered satisfactorily.   Disposition:  Pt is from Home, admitted with SOB, acute asthma exacerbation, still has significant shortness of breath and wheezing, which precludes a safe discharge. Discharge to home, when medically stable, may require 1 to 2 days more.  Subjective: No significant overnight events, patient is still complaining of severe shortness of breath and cough and having severe wheezing.  In the morning time patient required oxygen 2 L via nasal cannula.   Physical Exam: General:  alert oriented to time, place, and person.  Appear in moderate distress, affect appropriate  Eyes: PERRLA ENT: Oral Mucosa Clear, moist  Neck: no JVD,  Cardiovascular: S1 and S2 Present, no Murmur,  Respiratory: Increased respiratory effort, Bilateral Air entry equal and Decreased, mild Crackles, significant wheezing bilaterally Abdomen: Bowel Sound present, Soft and no tenderness,  Skin: no  rashes  Extremities: no Pedal edema, no calf tenderness Neurologic: without any new focal findings Gait not checked due to patient safety concerns  Vitals:   11/11/20 0459 11/11/20 0746 11/11/20 0805 11/11/20 1126  BP: 127/82 (!) 147/99  (!) 145/90  Pulse: 89 83  87  Resp: 16 17  15   Temp: 97.8 F (36.6 C) 98.2 F (36.8 C)  98.1 F (36.7 C)  TempSrc:      SpO2: 99% 98% 96% 95%  Weight:      Height:        Intake/Output Summary (Last 24 hours) at 11/11/2020 1349 Last data filed at 11/11/2020 1000 Gross per 24 hour  Intake 240 ml  Output --  Net 240 ml   Filed Weights   11/10/20 2311  Weight: (!) 136.1 kg    Data Reviewed: I have personally reviewed and interpreted daily labs, tele strips, imagings as discussed above. I reviewed all nursing notes, pharmacy notes, vitals, pertinent old records I have discussed plan of care as described above with RN and patient/family.  CBC: Recent Labs  Lab 11/10/20 1053 11/11/20 0615  WBC 7.5 12.2*  HGB 13.2 12.5*  HCT 41.9 40.0  MCV 85.0 85.8  PLT 414* 417*   Basic Metabolic Panel: Recent Labs  Lab 11/10/20 1053 11/11/20 0615  NA 136 136  K 3.9 4.3  CL 101 103  CO2 25 24  GLUCOSE 104* 128*  BUN 11 15  CREATININE 0.86 0.88  CALCIUM 9.1 8.9  MG  --  2.5*  PHOS  --  3.3    Studies: CT Angio Chest Pulmonary Embolism (PE) W or WO Contrast  Result Date: 11/10/2020 CLINICAL DATA:  Concern for pulmonary embolism. EXAM: CT ANGIOGRAPHY CHEST WITH CONTRAST TECHNIQUE: Multidetector CT imaging of the chest was performed using the standard protocol during bolus administration of intravenous contrast. Multiplanar CT image reconstructions and MIPs were obtained to evaluate the vascular anatomy. CONTRAST:  53mL OMNIPAQUE IOHEXOL 350 MG/ML SOLN COMPARISON:  Chest radiograph dated 05/08/2020 and 11/10/2020. FINDINGS: Cardiovascular: There is no cardiomegaly or pericardial effusion. The thoracic aorta is unremarkable. There is a  left-sided aortic arch with aberrant right subclavian artery anatomy. Evaluation of the pulmonary arteries is limited due suboptimal opacification and timing of the contrast. No pulmonary artery embolus identified. Mediastinum/Nodes: There is no hilar or mediastinal adenopathy. The esophagus is grossly unremarkable. No mediastinal fluid collection. Lungs/Pleura: The lungs are clear. There is no pleural effusion pneumothorax. The central airways are patent. Upper Abdomen: Partially visualized 2.5 cm left renal upper pole cyst. Musculoskeletal: No chest wall abnormality. No acute or significant osseous findings. Review of the MIP images confirms the above findings. IMPRESSION: 1. No acute intrathoracic pathology. No CT evidence of pulmonary embolism. 2. Left-sided aortic arch with aberrant right subclavian artery anatomy. Electronically Signed   By: 13/07/2020 M.D.   On: 11/10/2020 20:22    Scheduled Meds:  amLODipine  10 mg Oral Daily   chlorpheniramine-HYDROcodone  5 mL Oral QHS   dextromethorphan-guaiFENesin  1 tablet Oral BID   enoxaparin (LOVENOX) injection  0.5 mg/kg Subcutaneous Q24H   ipratropium-albuterol  3 mL Nebulization Once   ipratropium-albuterol  3 mL Nebulization TID   lidocaine  2 patch Transdermal Q24H   melatonin  5 mg Oral QHS   methylPREDNISolone (SOLU-MEDROL) injection  40 mg Intravenous Q8H   pantoprazole  40 mg Oral BID AC   Continuous Infusions:  azithromycin 500 mg (11/10/20 1557)   PRN Meds: acetaminophen **OR** acetaminophen, albuterol, benzonatate, hydrOXYzine, ondansetron **OR** ondansetron (ZOFRAN) IV  Time spent: 35 minutes  Author: Gillis Santa. MD [facility] 11/11/2020 1:49 PM  To reach On-call, see care teams to locate the attending and reach out to them via [email or web address]. If 7PM-7AM, please contact night-coverage If you still have difficulty reaching the attending provider, please page the [facility] (Director on Call) for [facility] on amion  for assistance.

## 2020-11-12 ENCOUNTER — Other Ambulatory Visit: Payer: Self-pay

## 2020-11-12 LAB — BASIC METABOLIC PANEL
Anion gap: 8 (ref 5–15)
BUN: 12 mg/dL (ref 6–20)
CO2: 26 mmol/L (ref 22–32)
Calcium: 8.8 mg/dL — ABNORMAL LOW (ref 8.9–10.3)
Chloride: 103 mmol/L (ref 98–111)
Creatinine, Ser: 0.81 mg/dL (ref 0.61–1.24)
GFR, Estimated: >60 mL/min (ref >60)
Glucose, Bld: 157 mg/dL — ABNORMAL HIGH (ref 70–99)
Potassium: 4.5 mmol/L (ref 3.5–5.1)
Sodium: 137 mmol/L (ref 135–145)

## 2020-11-12 LAB — MAGNESIUM: Magnesium: 2.7 mg/dL — ABNORMAL HIGH (ref 1.7–2.4)

## 2020-11-12 LAB — CBC
HCT: 39.5 % (ref 39.0–52.0)
Hemoglobin: 12.2 g/dL — ABNORMAL LOW (ref 13.0–17.0)
MCH: 26.7 pg (ref 26.0–34.0)
MCHC: 30.9 g/dL (ref 30.0–36.0)
MCV: 86.4 fL (ref 80.0–100.0)
Platelets: 438 10*3/uL — ABNORMAL HIGH (ref 150–400)
RBC: 4.57 MIL/uL (ref 4.22–5.81)
RDW: 14.5 % (ref 11.5–15.5)
WBC: 14.4 10*3/uL — ABNORMAL HIGH (ref 4.0–10.5)
nRBC: 0 % (ref 0.0–0.2)

## 2020-11-12 LAB — PHOSPHORUS: Phosphorus: 3.8 mg/dL (ref 2.5–4.6)

## 2020-11-12 MED ORDER — AZITHROMYCIN 500 MG PO TABS
500.0000 mg | ORAL_TABLET | Freq: Every day | ORAL | 0 refills | Status: AC
  Filled 2020-11-12: qty 3, 3d supply, fill #0

## 2020-11-12 MED ORDER — ALBUTEROL SULFATE HFA 108 (90 BASE) MCG/ACT IN AERS
2.0000 | INHALATION_SPRAY | RESPIRATORY_TRACT | 0 refills | Status: DC | PRN
  Filled 2020-11-12: qty 6.7, 17d supply, fill #0

## 2020-11-12 MED ORDER — FLUTICASONE FUROATE-VILANTEROL 200-25 MCG/ACT IN AEPB
1.0000 | INHALATION_SPRAY | Freq: Every day | RESPIRATORY_TRACT | 0 refills | Status: DC
  Filled 2020-11-12: qty 60, 30d supply, fill #0

## 2020-11-12 MED ORDER — PANTOPRAZOLE SODIUM 40 MG PO TBEC
40.0000 mg | DELAYED_RELEASE_TABLET | Freq: Two times a day (BID) | ORAL | 0 refills | Status: DC
Start: 2020-11-12 — End: 2021-12-29
  Filled 2020-11-12: qty 14, 7d supply, fill #0

## 2020-11-12 MED ORDER — PREDNISONE 10 MG PO TABS
ORAL_TABLET | ORAL | 0 refills | Status: DC
  Filled 2020-11-12: qty 21, 9d supply, fill #0

## 2020-11-12 NOTE — Discharge Summary (Signed)
[facility] Discharge Summary   Patient: Justin Reid ZOX:[id number]  PCP: Pcp, No  Date of admission: 11/10/2020   Date of discharge:  11/12/2020     Discharge Diagnoses:  Principal Problem:   Shortness of breath Active Problems:   Asthma exacerbation   Asthma   At risk for sleep apnea   Obesity hypoventilation syndrome (HCC)   Truncal obesity   2019-nCoV vaccination declined   SOB (shortness of breath)   Admitted From: Home Disposition:  Home   Recommendations for Outpatient Follow-up:  PCP: in 1 wk Follow up LABS/TEST:  none   Diet recommendation: Cardiac diet  Activity: The patient is advised to gradually reintroduce usual activities, as tolerated  Discharge Condition: stable  Code Status: Full code   History of present illness: As per the H and P dictated on admission Hospital Course:  Justin Reid is a 40 y.o. male with medical history significant for asthma, history of chronic pain with chronic opioid prescription, currently not on opoid medications,who presents emergency department for chief concerns of shortness of breath and a cough. He reports the shortness of breath worse has been ongoing and worsening in the last 2 days.  He states that the shortness of breath is worse when he lays flat and with exertion. He denies fever, known sick contacts.  He endorses new cough, productive light yellow, that is worse at night and prevents him from sleep.  He reports that the inhalers and breathing treatments have mild to moderately improved his symptoms.  He reports that this episode is similar to his prior presentation of asthma exacerbation.  He states he has been hospitalized for aspirate exacerbation for 7 days in the past before. He reports he does not have a primary care provider and does not have a maintenance inhaler for asthma.  He takes his albuterol daily due to daily needed. He also reports chest pain, specifically bilateral lateral chest pain with  coughing.     Assessment and Plan: Principal Problem:   Shortness of breath Active Problems:   Asthma exacerbation   Asthma   At risk for sleep apnea   Obesity hypoventilation syndrome (HCC)   Truncal obesity   2019-nCoV vaccination declined   # Acute hypoxic respiratory failure asthma exacerbation, presented with shortness of breath and cough.  Respiratory failure resolved, currently patient is saturating well on room air. # Chronic persistent asthma with asthma exacerbation, moderate to severe. Started Breo Ellipta inhaler daily. S/p DuoNeb every 6 hourly scheduled.  Discharged on albuterol inhaler as needed. S/p solu-Medrol 40 mg IV every 8 hourly, transition to oral prednisone Dosepak.  Started PPI for GI prophylaxis. D-dimer mildly elevated, CTA negative for PE. S/p Mucinex twice daily, Tussionex scheduled nightly, s/p prophylactic antibiotic azithromycin.  Prescribed azithromycin for 3 additional days. COVID-19 and flu negative. Follow respiratory viral panel, still in process. # Hypertension, continued amlodipine # Patient is at risk for obstructive sleep apnea with body habitus, CPAP nightly ordered.  Patient was advised to follow with PCP and pulmonologist as an outpatient for referral for sleep study to rule out OSA. # Infrequent tobacco use-counseled patient on complete tobacco cessation Morbid obesity, Body mass index is 41.85 kg/m.  Interventions: Calorie restricted diet and daily exercise advised to lose body weight      Patient was ambulatory without any assistance. On the day of the discharge the patient's vitals were stable, and no other acute medical condition were reported by patient. the patient was felt safe  to be discharge at Home.  Consultants: None Procedures: None  Discharge Exam: General: Appear in no distress, no Rash; Oral Mucosa Clear, moist. Cardiovascular: S1 and S2 Present, no Murmur, Respiratory: normal respiratory effort, Bilateral Air entry present  and no Crackles, mid wheezes Abdomen: Bowel Sound present, Soft and no tenderness, no hernia Extremities: no Pedal edema, no calf tenderness Neurology: alert and oriented to time, place, and person affect appropriate.  Filed Weights   11/10/20 2311  Weight: (!) 136.1 kg   Vitals:   11/12/20 0343 11/12/20 0900  BP: 137/87 (!) 132/96  Pulse: 86 78  Resp: 17 17  Temp: 98 F (36.7 C) 97.8 F (36.6 C)  SpO2: 97%     DISCHARGE MEDICATION: Allergies as of 11/12/2020   No Known Allergies      Medication List     STOP taking these medications    allopurinol 300 MG tablet Commonly known as: Zyloprim   ascorbic acid 500 MG tablet Commonly known as: VITAMIN C   benzonatate 100 MG capsule Commonly known as: Tessalon Perles   colchicine 0.6 MG tablet   HYDROcodone-acetaminophen 5-325 MG tablet Commonly known as: NORCO/VICODIN   hydrOXYzine 25 MG tablet Commonly known as: ATARAX/VISTARIL   meloxicam 15 MG tablet Commonly known as: MOBIC   oxyCODONE-acetaminophen 5-325 MG tablet Commonly known as: Percocet   Preparation H 0.25-14-74.9 % rectal ointment Generic drug: phenylephrine-shark liver oil-mineral oil-petrolatum   zinc sulfate 220 (50 Zn) MG capsule       TAKE these medications    albuterol 108 (90 Base) MCG/ACT inhaler Commonly known as: VENTOLIN HFA Inhale 2 puffs into the lungs every 4 (four) hours as needed for wheezing or shortness of breath. What changed: Another medication with the same name was removed. Continue taking this medication, and follow the directions you see here.   amLODipine 10 MG tablet Commonly known as: NORVASC Take 1 tablet (10 mg total) by mouth daily.   azithromycin 500 MG tablet Commonly known as: Zithromax Take 1 tablet (500 mg total) by mouth daily for 3 days. What changed:  medication strength how much to take how to take this when to take this additional instructions   fluticasone furoate-vilanterol 200-25  MCG/ACT Aepb Commonly known as: BREO ELLIPTA Inhale 1 puff into the lungs daily. Start taking on: November 13, 2020   pantoprazole 40 MG tablet Commonly known as: PROTONIX Take 1 tablet (40 mg total) by mouth 2 (two) times daily before a meal for 7 days.   predniSONE 10 MG (21) Tbpk tablet Commonly known as: STERAPRED UNI-PAK 21 TAB 40 mg p.o. daily for 2 days, 30 mg daily for 2 days, 20 mg p.o. daily for 2 days, 10 mg p.o. daily for 3 days       No Known Allergies Discharge Instructions     Call MD for:  difficulty breathing, headache or visual disturbances   Complete by: As directed    Call MD for:  extreme fatigue   Complete by: As directed    Call MD for:  persistant nausea and vomiting   Complete by: As directed    Call MD for:  severe uncontrolled pain   Complete by: As directed    Call MD for:  temperature >100.4   Complete by: As directed    Diet - low sodium heart healthy   Complete by: As directed    Discharge instructions   Complete by: As directed    Follow-up with PCP in 1 week  Increase activity slowly   Complete by: As directed        The results of significant diagnostics from this hospitalization (including imaging, microbiology, ancillary and laboratory) are listed below for reference.    Significant Diagnostic Studies: DG Chest 2 View  Result Date: 10/29/2020 CLINICAL DATA:  cough, CP, low energy EXAM: CHEST - 2 VIEW COMPARISON:  June 26, 2020 FINDINGS: Similar cardiomediastinal silhouette, within normal limits. Both lungs are clear. No visible pleural effusions or pneumothorax. No acute osseous abnormality. Possible loose bodies in left shoulder joint, incompletely imaged. IMPRESSION: No evidence of acute cardiopulmonary disease. Electronically Signed   By: Feliberto Harts M.D.   On: 10/29/2020 13:11   CT Angio Chest Pulmonary Embolism (PE) W or WO Contrast  Result Date: 11/10/2020 CLINICAL DATA:  Concern for pulmonary embolism. EXAM: CT  ANGIOGRAPHY CHEST WITH CONTRAST TECHNIQUE: Multidetector CT imaging of the chest was performed using the standard protocol during bolus administration of intravenous contrast. Multiplanar CT image reconstructions and MIPs were obtained to evaluate the vascular anatomy. CONTRAST:  35mL OMNIPAQUE IOHEXOL 350 MG/ML SOLN COMPARISON:  Chest radiograph dated 05/08/2020 and 11/10/2020. FINDINGS: Cardiovascular: There is no cardiomegaly or pericardial effusion. The thoracic aorta is unremarkable. 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 Evaluation of the pulmonary arteries is limited due suboptimal opacification and timing of the contrast. No pulmonary artery embolus identified. Mediastinum/Nodes: There is no hilar or mediastinal adenopathy. The esophagus is grossly unremarkable. No mediastinal fluid collection. Lungs/Pleura: The lungs are clear. There is no pleural effusion pneumothorax. The central airways are patent. Upper Abdomen: Partially visualized 2.5 cm left renal upper pole cyst. Musculoskeletal: No chest wall abnormality. No acute or significant osseous findings. Review of the MIP images confirms the above findings. IMPRESSION: 1. No acute intrathoracic pathology. No CT evidence of pulmonary embolism. 2. Left-sided aortic arch with aberrant right subclavian artery anatomy. Electronically Signed   By: Elgie Collard M.D.   On: 11/10/2020 20:22   DG Chest Portable 1 View  Result Date: 11/10/2020 CLINICAL DATA:  Cough, shortness of breath. Additional history provided: Patient reports cough, shortness of breath, chest pain, wheezing. Cough productive of yellow sputum. EXAM: PORTABLE CHEST 1 VIEW COMPARISON:  Prior chest radiographs 10/29/2020 and earlier. FINDINGS: Shallow inspiration radiograph. Heart size within normal limits. No appreciable airspace consolidation. No evidence of pleural effusion or pneumothorax. No acute bony abnormality identified. Degenerative  changes of the spine. IMPRESSION: Shallow inspiration radiograph. No evidence of acute cardiopulmonary abnormality. Electronically Signed   By: Jackey Loge D.O.   On: 11/10/2020 11:51    Microbiology: Recent Results (from the past 240 hour(s))  Resp Panel by RT-PCR (Flu A&B, Covid) Nasopharyngeal Swab     Status: None   Collection Time: 11/10/20 12:21 PM   Specimen: Nasopharyngeal Swab; Nasopharyngeal(NP) swabs in vial transport medium  Result Value Ref Range Status   SARS Coronavirus 2 by RT PCR NEGATIVE NEGATIVE Final    Comment: (NOTE) SARS-CoV-2 target nucleic acids are NOT DETECTED.  The SARS-CoV-2 RNA is generally detectable in upper respiratory specimens during the acute phase of infection. The lowest concentration of SARS-CoV-2 viral copies this assay can detect is 138 copies/mL. A negative result does not preclude SARS-Cov-2 infection and should not be used as the sole basis for treatment or other patient management decisions. A negative result may occur with  improper specimen collection/handling, submission of specimen other than nasopharyngeal swab, presence of viral mutation(s) within the areas targeted by this  assay, and inadequate number of viral copies(<138 copies/mL). A negative result must be combined with clinical observations, patient history, and epidemiological information. The expected result is Negative.  Fact Sheet for Patients:  BloggerCourse.com  Fact Sheet for Healthcare Providers:  SeriousBroker.it  This test is no t yet approved or cleared by the [place] FDA and  has been authorized for detection and/or diagnosis of SARS-CoV-2 by FDA under an Emergency Use Authorization (EUA). This EUA will remain  in effect (meaning this test can be used) for the duration of the COVID-19 declaration under Section 564(b)(1) of the Act, 21 U.S.C.section 360bbb-3(b)(1), unless the authorization is terminated  or  revoked sooner.       Influenza A by PCR NEGATIVE NEGATIVE Final   Influenza B by PCR NEGATIVE NEGATIVE Final    Comment: (NOTE) The Xpert Xpress SARS-CoV-2/FLU/RSV plus assay is intended as an aid in the diagnosis of influenza from Nasopharyngeal swab specimens and should not be used as a sole basis for treatment. Nasal washings and aspirates are unacceptable for Xpert Xpress SARS-CoV-2/FLU/RSV testing.  Fact Sheet for Patients: BloggerCourse.com  Fact Sheet for Healthcare Providers: SeriousBroker.it  This test is not yet approved or cleared by the [place] FDA and has been authorized for detection and/or diagnosis of SARS-CoV-2 by FDA under an Emergency Use Authorization (EUA). This EUA will remain in effect (meaning this test can be used) for the duration of the COVID-19 declaration under Section 564(b)(1) of the Act, 21 U.S.C. section 360bbb-3(b)(1), unless the authorization is terminated or revoked.  Performed at [facility], [address] [place], [place] [address]      Labs: CBC: Recent Labs  Lab 11/10/20 1053 11/11/20 0615 11/12/20 0352  WBC 7.5 12.2* 14.4*  HGB 13.2 12.5* 12.2*  HCT 41.9 40.0 39.5  MCV 85.0 85.8 86.4  PLT 414* 417* 438*   Basic Metabolic Panel: Recent Labs  Lab 11/10/20 1053 11/11/20 0615 11/12/20 0352  NA 136 136 137  K 3.9 4.3 4.5  CL 101 103 103  CO2 25 24 26   GLUCOSE 104* 128* 157*  BUN 11 15 12   CREATININE 0.86 0.88 0.81  CALCIUM 9.1 8.9 8.8*  MG  --  2.5* 2.7*  PHOS  --  3.3 3.8   Liver Function Tests: No results for input(s): AST, ALT, ALKPHOS, BILITOT, PROT, ALBUMIN in the last 168 hours. No results for input(s): LIPASE, AMYLASE in the last 168 hours. No results for input(s): AMMONIA in the last 168 hours. Cardiac Enzymes: No results for input(s): CKTOTAL, CKMB, CKMBINDEX, TROPONINI in the last 168 hours. BNP (last 3 results) No results for  input(s): BNP in the last 8760 hours. CBG: No results for input(s): GLUCAP in the last 168 hours.  Time spent: 35 minutes  Signed:   [facility]  11/12/2020 1:45 PM

## 2020-11-12 NOTE — TOC Progression Note (Signed)
Transition of Care [facility]) - Progression Note    Patient Details  Name: Justin Reid MRN: [id number] Date of Birth: Mar 20, 1980  Transition of [facility]) CM/SW [place], RN Phone Number: 11/12/2020, 10:00 AM  Clinical Narrative:   Met with the patient and his wife in the room, He is on Oxygen here in the hospital but not at home, I explained that we would Monitor the possible need for Home Oxygen and get set up if needed. He does not have a PCP, I provided him with [facility] application and sent the Referral thru Email to Vonte at [facility], The patient is aware to call [facility] for an appointment, He will need to get his medications at [facility] and that has been added to the system, He is aware that the application packet will also work from [facility] to continue to get Monthly Medication, He is independent with ambulation and still works, TOC to continue to monitor for additional needs          Expected Discharge Plan and Services                                                 Social Determinants of Health (SDOH) Interventions    Readmission Risk Interventions No flowsheet data found.

## 2020-12-03 ENCOUNTER — Emergency Department: Payer: Self-pay

## 2020-12-03 ENCOUNTER — Observation Stay
Admission: EM | Admit: 2020-12-03 | Discharge: 2020-12-04 | Disposition: A | Discharge status: Home / Self Care | Payer: Self-pay | Attending: Emergency Medicine | Admitting: Emergency Medicine

## 2020-12-03 ENCOUNTER — Other Ambulatory Visit: Payer: Self-pay

## 2020-12-03 DIAGNOSIS — E662 Morbid (severe) obesity with alveolar hypoventilation: Secondary | ICD-10-CM | POA: Diagnosis present

## 2020-12-03 DIAGNOSIS — J45909 Unspecified asthma, uncomplicated: Secondary | ICD-10-CM | POA: Insufficient documentation

## 2020-12-03 DIAGNOSIS — J9801 Acute bronchospasm: Secondary | ICD-10-CM | POA: Insufficient documentation

## 2020-12-03 DIAGNOSIS — F17210 Nicotine dependence, cigarettes, uncomplicated: Secondary | ICD-10-CM | POA: Insufficient documentation

## 2020-12-03 DIAGNOSIS — Z79899 Other long term (current) drug therapy: Secondary | ICD-10-CM | POA: Insufficient documentation

## 2020-12-03 DIAGNOSIS — E876 Hypokalemia: Principal | ICD-10-CM | POA: Insufficient documentation

## 2020-12-03 DIAGNOSIS — R0602 Shortness of breath: Secondary | ICD-10-CM | POA: Diagnosis present

## 2020-12-03 DIAGNOSIS — U071 COVID-19: Secondary | ICD-10-CM | POA: Insufficient documentation

## 2020-12-03 DIAGNOSIS — Z9189 Other specified personal risk factors, not elsewhere classified: Secondary | ICD-10-CM

## 2020-12-03 DIAGNOSIS — I10 Essential (primary) hypertension: Secondary | ICD-10-CM | POA: Insufficient documentation

## 2020-12-03 LAB — TROPONIN I (HIGH SENSITIVITY)
Troponin I (High Sensitivity): 7 ng/L (ref <18)
Troponin I (High Sensitivity): 7 ng/L (ref <18)

## 2020-12-03 LAB — BASIC METABOLIC PANEL
Anion gap: 8 (ref 5–15)
BUN: 15 mg/dL (ref 6–20)
CO2: 25 mmol/L (ref 22–32)
Calcium: 8.4 mg/dL — ABNORMAL LOW (ref 8.9–10.3)
Chloride: 101 mmol/L (ref 98–111)
Creatinine, Ser: 1.05 mg/dL (ref 0.61–1.24)
GFR, Estimated: >60 mL/min (ref >60)
Glucose, Bld: 111 mg/dL — ABNORMAL HIGH (ref 70–99)
Potassium: 2.7 mmol/L — CL (ref 3.5–5.1)
Sodium: 134 mmol/L — ABNORMAL LOW (ref 135–145)

## 2020-12-03 LAB — CBC
HCT: 38.5 % — ABNORMAL LOW (ref 39.0–52.0)
Hemoglobin: 12.8 g/dL — ABNORMAL LOW (ref 13.0–17.0)
MCH: 27.8 pg (ref 26.0–34.0)
MCHC: 33.2 g/dL (ref 30.0–36.0)
MCV: 83.7 fL (ref 80.0–100.0)
Platelets: 371 10*3/uL (ref 150–400)
RBC: 4.6 MIL/uL (ref 4.22–5.81)
RDW: 14.3 % (ref 11.5–15.5)
WBC: 7.2 10*3/uL (ref 4.0–10.5)
nRBC: 0 % (ref 0.0–0.2)

## 2020-12-03 LAB — RESP PANEL BY RT-PCR (FLU A&B, COVID) ARPGX2
Influenza A by PCR: NEGATIVE
Influenza B by PCR: NEGATIVE
SARS Coronavirus 2 by RT PCR: POSITIVE — AB

## 2020-12-03 MED ORDER — IPRATROPIUM-ALBUTEROL 0.5-2.5 (3) MG/3ML IN SOLN
3.0000 mL | Freq: Once | RESPIRATORY_TRACT | Status: AC
  Administered 2020-12-03: 3 mL via RESPIRATORY_TRACT
  Filled 2020-12-03: qty 3

## 2020-12-03 MED ORDER — METHYLPREDNISOLONE SODIUM SUCC 125 MG IJ SOLR
125.0000 mg | INTRAMUSCULAR | Status: AC
  Administered 2020-12-03: 125 mg via INTRAVENOUS
  Filled 2020-12-03: qty 2

## 2020-12-03 MED ORDER — POTASSIUM CHLORIDE CRYS ER 20 MEQ PO TBCR
40.0000 meq | EXTENDED_RELEASE_TABLET | Freq: Once | ORAL | Status: AC
  Administered 2020-12-03: 40 meq via ORAL
  Filled 2020-12-03: qty 2

## 2020-12-03 MED ORDER — PREDNISONE 20 MG PO TABS: 60.0000 mg | ORAL_TABLET | Freq: Once | ORAL | Status: DC

## 2020-12-03 MED ORDER — POTASSIUM CHLORIDE 10 MEQ/100ML IV SOLN
10.0000 meq | INTRAVENOUS | Status: AC
  Administered 2020-12-03 (×2): 10 meq via INTRAVENOUS
  Filled 2020-12-03 (×2): qty 100

## 2020-12-03 MED ORDER — POTASSIUM CHLORIDE CRYS ER 20 MEQ PO TBCR
40.0000 meq | EXTENDED_RELEASE_TABLET | Freq: Once | ORAL | Status: AC
  Administered 2020-12-04: 40 meq via ORAL
  Filled 2020-12-03: qty 2

## 2020-12-03 NOTE — ED Provider Notes (Signed)
  Emergency Medicine Provider Triage Evaluation Note  Justin Reid , a 40 y.o.male,  was evaluated in triage.  Pt complains of shortness of breath.  Patient states that started last night, he has been feeling sick recently the past few days.  He currently has labored breathing with audible expiratory wheezes.  He has a history of asthma and has had no relief with his inhaler   Review of Systems  Positive: Shortness of breath, chest pain Negative: Denies fever, chest pain, vomiting  Physical Exam   Vitals:   12/03/20 1534  BP: (!) 147/99  Pulse: (!) 54  Resp: (!) 26  Temp: 99.1 F (37.3 C)  SpO2: 97%   Gen:   Awake, distressed. Resp:  Labored breathing, audible expiratory wheezes. MSK:   Moves extremities without difficulty  Other:    Medical Decision Making  Given the patient's initial medical screening exam, the following diagnostic evaluation has been ordered. The patient will be placed in the appropriate treatment space, once one is available, to complete the evaluation and treatment. I have discussed the plan of care with the patient and I have advised the patient that an ED physician or mid-level practitioner will reevaluate their condition after the test results have been received, as the results may give them additional insight into the type of treatment they may need.    Diagnostics: Labs, EKG, respiratory panel.  Treatments: Eligha Bridegroom, [place] 12/03/20 1549    Shaune Pollack, MD 12/03/20 2201

## 2020-12-03 NOTE — ED Provider Notes (Signed)
[facility] Emergency Department Provider Note   ____________________________________________   Event Date/Time   First MD Initiated Contact with Patient 12/03/20 1848     (approximate)  I have reviewed the triage vital signs and the nursing notes.   HISTORY  Chief Complaint Shortness of Breath    HPI Justin Reid is a 40 y.o. male history of asthma and hypertension  Yesterday patient started developing cough congestion, wheezing using his albuterol inhaler at home without improvement.  He also feels a light headache and muscle aches.  No chest pain no nausea or vomiting.  Positive for shortness of breath but reports that has improved after getting breathing treatment in the ER earlier though still slightly short of breath  Still eating and drinking well.  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  Prior to Admission medications   Medication Sig Start Date End Date Taking? Authorizing Provider  albuterol (VENTOLIN HFA) 108 (90 Base) MCG/ACT inhaler Inhale 2 puffs into the lungs once every 4 (four) hours as needed. 11/12/20   Gillis Santa, MD  amLODipine (NORVASC) 10 MG tablet Take 1 tablet (10 mg total) by mouth daily. 10/11/19 10/10/20  Burnadette Pop, MD  fluticasone  furoate-vilanterol (BREO ELLIPTA) 200-25 MCG/ACT AEPB Inhale 1 puff into the lungs once daily. 11/13/20   Gillis Santa, MD  pantoprazole (PROTONIX) 40 MG tablet Take 1 tablet (40 mg total) by mouth 2 (two) times daily before a meal for 7 days. 11/12/20 11/19/20  Gillis Santa, MD  predniSONE (DELTASONE) 10 MG tablet TAKE 4 TABLETS (40MG ) BY MOUTH ONCE DAILY FOR 2 DAYS. THEN TAKE 3 TABS (30MG ) DAILY FOR 2 DAYS. THEN TAKE 2 TABS (20MG ) DAILY FOR 2 DAYS. THEN TAKE 1 TAB (10MG ) DAILY FOR 3 DAYS. 11/12/20   , MD    Allergies Patient has no known allergies.  Family History  Problem Relation Age of Onset   Hypertension Father     Social History Social History   Tobacco Use   Smoking status: Some Days    Packs/day: 0.25    Years: 5.00    Pack years: 1.25    Types: Cigarettes   Smokeless tobacco: Never  Vaping Use   Vaping Use: Never used  Substance Use Topics   Alcohol use: Yes   Drug use: No    Review of Systems Constitutional: Feeling some fevers chills and fatigue Eyes: No visual changes. ENT: No sore throat. Cardiovascular: Denies chest pain. Respiratory: See HPI gastrointestinal: No abdominal pain.   Genitourinary: Negative for dysuria. Musculoskeletal: Some muscle aches Skin: Negative for rash. Neurological: Negative for areas of focal weakness or numbness.    ____________________________________________   PHYSICAL EXAM:  VITAL SIGNS: ED Triage Vitals  Enc Vitals Group  BP 12/03/20 1534 (!) 147/99     Pulse Rate 12/03/20 1534 (!) 54     Resp 12/03/20 1534 (!) 26     Temp 12/03/20 1534 99.1 F (37.3 C)     Temp src --      SpO2 12/03/20 1534 97 %     Weight 12/03/20 1533 (!) 302 lb 0.5 oz (137 kg)     Height 12/03/20 1533 5\' 11"  (1.803 m)     Head Circumference --      Peak Flow --      Pain Score 12/03/20 1532 8     Pain Loc --      Pain Edu? --      Excl. in GC? --     Constitutional: Alert and oriented. Well appearing and in no acute  distress.  He does speak in phrases and has mild increased work of breathing apparent when speaking to him.  When he starts having conversation he appears slightly short winded Eyes: Conjunctivae are normal. Head: Atraumatic. Nose: No congestion/rhinnorhea. Mouth/Throat: Mucous membranes are moist. Neck: No stridor.  Cardiovascular: Slightly tachycardic rate, regular rhythm. Grossly normal heart sounds.  Good peripheral circulation. Respiratory: Normal respiratory effort.  No retractions.  Mild end expiratory wheezing throughout.  No crackles.  Work of breathing appears fairly normal until he begins to converse in which case he became slightly more tachypneic began to speak in phrases looking slightly short of breath Gastrointestinal: Soft and nontender. No distention. Musculoskeletal: No lower extremity tenderness nor edema. Neurologic:  Normal speech and language. No gross focal neurologic deficits are appreciated.  Skin:  Skin is warm, dry and intact. No rash noted. Psychiatric: Mood and affect are normal. Speech and behavior are normal.  ____________________________________________   LABS (all labs ordered are listed, but only abnormal results are displayed)  Labs Reviewed  RESP PANEL BY RT-PCR (FLU A&B, COVID) ARPGX2 - Abnormal; Notable for the following components:      Result Value   SARS Coronavirus 2 by RT PCR POSITIVE (*)    All other components within normal limits  BASIC METABOLIC PANEL - Abnormal; Notable for the following components:   Sodium 134 (*)    Potassium 2.7 (*)    Glucose, Bld 111 (*)    Calcium 8.4 (*)    All other components within normal limits  CBC - Abnormal; Notable for the following components:   Hemoglobin 12.8 (*)    HCT 38.5 (*)    All other components within normal limits  BASIC METABOLIC PANEL  MAGNESIUM  TROPONIN I (HIGH SENSITIVITY)  TROPONIN I (HIGH SENSITIVITY)   ____________________________________________  EKG  Reviewed inter by me  at 1532 Heart rate 109 QRS 109 QTc 400 Normal sinus rhythm, mild nonspecific T wave abnormality is present, does not appear to be acutely ischemic though. ____________________________________________  RADIOLOGY  DG Chest 2 View  Result Date: 12/03/2020 CLINICAL DATA:  Short of breath and wheezing EXAM: CHEST - 2 VIEW COMPARISON:  11/10/2020 FINDINGS: The heart size and mediastinal contours are within normal limits. Both lungs are clear. The visualized skeletal structures are unremarkable. IMPRESSION: No active cardiopulmonary disease. Electronically Signed   By: 13/07/2020 M.D.   On: 12/03/2020 16:01    Chest x-ray reviewed, negative for acute finding ____________________________________________   PROCEDURES  Procedure(s) performed: None  Procedures  Critical Care performed: No  ____________________________________________   INITIAL IMPRESSION / ASSESSMENT AND PLAN / ED COURSE  Pertinent labs & imaging results that were  available during my care of the patient were reviewed by me and considered in my medical decision making (see chart for details).   Patient coronavirus test positive.  Appears consistent with likely COVID associated upper respiratory infection and bronchospasm likely secondary to his asthma.  Does have a history of obesity hypoventilation should as well as asthma exacerbation.  Denies chest pain.  Noted mild hypokalemia as well.  We will replete his potassium, this may also be responsible for mild nonspecific T wave abnormality.  Will give additional nebulizer treatments and steroid.  COVID treatment likely to hinge upon inpatient versus outpatient  Plan to replete potassium, reevaluate work of breathing, and consideration for admission versus potential discharge with close outpatient follow-up.  Discussed with the patient and his wife both understand agreeable this plan, plan to reevaluate.    Clinical Course as of 12/03/20 2332  Wed Dec 03, 2020  2228  With ambulation patient's oxygen saturation remains in the low 90s, but he does become notably tachycardic.  Developed slight tachypnea.  Lung sounds are clear now however and the patient reports he does not feel shortness of breath ongoing.  Discussed with him and his family at the bedside, and in the context of his risk factors, presentation today, COVID-19 diagnosis, exacerbation of reactive airway disease, and hypokalemia at this point will admit for further care and management especially given his associated tachycardia and tachypnea and noted with attempted ambulation at this time [MQ]    Clinical Course User Index [MQ] Sharyn Creamer, MD    [place] discussed with hospitalist Dr. Sedalia Muta ____________________________________________   FINAL CLINICAL IMPRESSION(S) / ED DIAGNOSES  Final diagnoses:  Hypokalemia  COVID-19  Acute bronchospasm        Note:  This document was prepared using Dragon voice recognition software and may include unintentional dictation errors       Sharyn Creamer, MD 12/03/20 2332

## 2020-12-03 NOTE — ED Notes (Signed)
ED Provider at bedside. 

## 2020-12-03 NOTE — ED Triage Notes (Addendum)
Pt to ED for shob and wheezing that started last night, states has been sick for a few days. Labored breathing, unable to speak in complete sentences.Audible expiratory wheezes noted. Denies fevers +chest pain that started last night +asthma, no relief with inhaler  Recently discharged for same

## 2020-12-04 ENCOUNTER — Encounter: Payer: Self-pay | Admitting: Internal Medicine

## 2020-12-04 DIAGNOSIS — E876 Hypokalemia: Secondary | ICD-10-CM | POA: Diagnosis present

## 2020-12-04 LAB — BASIC METABOLIC PANEL
Anion gap: 9 (ref 5–15)
BUN: 14 mg/dL (ref 6–20)
CO2: 23 mmol/L (ref 22–32)
Calcium: 8.5 mg/dL — ABNORMAL LOW (ref 8.9–10.3)
Chloride: 102 mmol/L (ref 98–111)
Creatinine, Ser: 1.02 mg/dL (ref 0.61–1.24)
GFR, Estimated: >60 mL/min (ref >60)
Glucose, Bld: 150 mg/dL — ABNORMAL HIGH (ref 70–99)
Potassium: 3.3 mmol/L — ABNORMAL LOW (ref 3.5–5.1)
Sodium: 134 mmol/L — ABNORMAL LOW (ref 135–145)

## 2020-12-04 LAB — POTASSIUM: Potassium: 4.1 mmol/L (ref 3.5–5.1)

## 2020-12-04 LAB — MAGNESIUM: Magnesium: 2 mg/dL (ref 1.7–2.4)

## 2020-12-04 MED ORDER — SODIUM CHLORIDE 0.9 % IV SOLN
1.0000 mg/kg | Freq: Two times a day (BID) | INTRAVENOUS | Status: DC
  Filled 2020-12-04 (×2): qty 2.19

## 2020-12-04 MED ORDER — NICOTINE 14 MG/24HR TD PT24: 14.0000 mg | MEDICATED_PATCH | Freq: Every day | TRANSDERMAL | Status: DC | PRN

## 2020-12-04 MED ORDER — PREDNISONE 20 MG PO TABS: 50.0000 mg | ORAL_TABLET | Freq: Every day | ORAL | Status: DC

## 2020-12-04 MED ORDER — ONDANSETRON HCL 4 MG/2ML IJ SOLN: 4.0000 mg | Freq: Four times a day (QID) | INTRAMUSCULAR | Status: DC | PRN

## 2020-12-04 MED ORDER — PREDNISONE 20 MG PO TABS: 40.0000 mg | ORAL_TABLET | Freq: Every day | ORAL | 0 refills | Status: AC

## 2020-12-04 MED ORDER — ALBUTEROL SULFATE HFA 108 (90 BASE) MCG/ACT IN AERS: 2.0000 | INHALATION_SPRAY | RESPIRATORY_TRACT | 0 refills | Status: DC | PRN

## 2020-12-04 MED ORDER — ACETAMINOPHEN 325 MG PO TABS: 650.0000 mg | ORAL_TABLET | Freq: Four times a day (QID) | ORAL | Status: DC | PRN

## 2020-12-04 MED ORDER — ONDANSETRON HCL 4 MG PO TABS: 4.0000 mg | ORAL_TABLET | Freq: Four times a day (QID) | ORAL | Status: DC | PRN

## 2020-12-04 MED ORDER — GUAIFENESIN-DM 100-10 MG/5ML PO SYRP: 10.0000 mL | ORAL_SOLUTION | ORAL | Status: DC | PRN

## 2020-12-04 MED ORDER — BENZONATATE 200 MG PO CAPS: 200.0000 mg | ORAL_CAPSULE | Freq: Three times a day (TID) | ORAL | 0 refills | Status: AC

## 2020-12-04 MED ORDER — ENOXAPARIN SODIUM 80 MG/0.8ML IJ SOSY
0.5000 mg/kg | PREFILLED_SYRINGE | INTRAMUSCULAR | Status: DC
  Filled 2020-12-04: qty 0.68

## 2020-12-04 MED ORDER — HYDROCOD POLST-CPM POLST ER 10-8 MG/5ML PO SUER
5.0000 mL | Freq: Every evening | ORAL | Status: DC | PRN
  Filled 2020-12-04: qty 5

## 2020-12-04 MED ORDER — FLUTICASONE FUROATE-VILANTEROL 200-25 MCG/ACT IN AEPB
1.0000 | INHALATION_SPRAY | Freq: Every day | RESPIRATORY_TRACT | Status: DC
  Filled 2020-12-04: qty 28

## 2020-12-04 MED ORDER — AMLODIPINE BESYLATE 5 MG PO TABS
10.0000 mg | ORAL_TABLET | Freq: Every day | ORAL | Status: DC
  Administered 2020-12-04: 10 mg via ORAL
  Filled 2020-12-04: qty 2

## 2020-12-04 MED ORDER — ALBUTEROL SULFATE HFA 108 (90 BASE) MCG/ACT IN AERS
2.0000 | INHALATION_SPRAY | RESPIRATORY_TRACT | Status: DC | PRN
  Administered 2020-12-04: 2 via RESPIRATORY_TRACT
  Filled 2020-12-04 (×2): qty 6.7

## 2020-12-04 NOTE — Progress Notes (Signed)
PHARMACIST - PHYSICIAN COMMUNICATION  CONCERNING:  Enoxaparin (Lovenox) for DVT Prophylaxis    RECOMMENDATION: Patient was prescribed enoxaprin 40mg  q24 hours for VTE prophylaxis.   Filed Weights   12/03/20 1533  Weight: (!) 137 kg (302 lb 0.5 oz)    Body mass index is 42.12 kg/m.  Estimated Creatinine Clearance: 137.5 mL/min (by C-G formula based on SCr of 1.02 mg/dL).   Based on [facility] policy patient is candidate for enoxaparin 0.5mg /kg TBW SQ every 24 hours based on BMI being >30.  DESCRIPTION: Pharmacy has adjusted enoxaparin dose per [facility] policy.  Patient is now receiving enoxaparin 0.5 mg/kg every 24 hours    [facility], PharmD, [facility] 12/04/2020 12:26 AM

## 2020-12-04 NOTE — ED Notes (Signed)
Dr Sreenath at bedside 

## 2020-12-04 NOTE — H&P (Signed)
History and Physical   Justin Reid [id number] DOB: 20-Sep-1980 DOA: 12/03/2020  PCP: Pcp, No  Patient coming from: home   I have personally briefly reviewed patient's old medical records in [place].  Chief Concern: Shortness of breath  HPI: Justin Reid is a 40 y.o. male with medical history significant for chronic pain syndrome on chronic opioid prescription, currently not on opioid medications, obesity, tobacco use, who presents to the emergency department for chief concerns of shortness of breath.  He reports that he woke up on day of admission with shortness of breath he could not catch his breath.  He denies any fever and/or known sick contacts.  He endorses nonproductive cough.  He denies chest pain, abdominal pain, dysuria, hematuria.  He does endorse diarrhea for the last 2 days.  He states that for the last 2 days he has had at least 3 episodes of diarrhea each day.  He denies nausea and vomiting.  He denies syncope, loss of consciousness.  Social history: He lives at home with his wife.  He smokes about half a pack of cigarettes per day.  He endorses infrequent EtOH use.  He denies recreational drug use.  He currently works in the [employer].  Vaccination history: He is not vaccinated for COVID-19  ROS: Constitutional: no weight change, no fever ENT/Mouth: no sore throat, no rhinorrhea Eyes: no eye pain, no vision changes Cardiovascular: no chest pain, no dyspnea,  no edema, no palpitations Respiratory: no cough, no sputum, no wheezing Gastrointestinal: no nausea, no vomiting, no diarrhea, no constipation Genitourinary: no urinary incontinence, no dysuria, no hematuria Musculoskeletal: no arthralgias, no myalgias Skin: no skin lesions, no pruritus, Neuro: + weakness, no loss of consciousness, no syncope Psych: no anxiety, no depression, + decrease appetite Heme/Lymph: no bruising, no bleeding  ED Course: Discussed with ED provider, patient  requiring hospitalization for chief concerns of hypokalemia.  Vitals in the emergency department was remarkable for T-max of 99.1, respiration rate initially was 26 and improved to 19, blood pressure 147/99, SPO2 of 97% on room air.  Labs in the emergency department was remarkable for serum sodium 134, potassium was 2.7, chloride was 101, bicarb was 25, BUN of 15, serum creatinine of 1.05, nonfasting blood glucose 111, GFR greater than 60, WBC 7.2, hemoglobin 12.8, platelets of 371.  In the emergency department patient received 4 treatments of duo nebs, Solu-Medrol 125 mg IV, potassium 40 mill equivalent p.o. once, potassium chloride by IV 10 mill equivalent x2.  Assessment/Plan  Principal Problem:   Hypokalemia due to excessive gastrointestinal loss of potassium Active Problems:   Hypokalemia   Shortness of breath   At risk for sleep apnea   Obesity hypoventilation syndrome ([place])   # Shortness of breath, presumed secondary to COVID-19 infection in setting of not being vaccinated -Status post duo nebs 0.5-2.5 x 4 -Status post Solu-Medrol 125 mg IV  # Hypokalemia presumed secondary to GI loss in setting of COVID-19 gastroenteritis - Recheck potassium - Give additional dose of potassium chloride by p.o. - BMP in the a.m. - Plan was to recheck potassium level and if appropriate increase, patient would meet criteria for discharge home same day - However patient states that he would feel more comfortable staying 1 night  # Tobacco use disorder - Greater than 3 minutes spent for tobacco cessation counseling - Patient endorses readiness to stop tobacco use. - Tobacco cessation counseling:  Week one, smoke 9 cigarettes per day. Week two, smoke  8 cigarettes per day. Week three, smoke 7 cigarettes per day, continue until smoking half the amount of cigarettes per day Discuss with PCP for pharmacologic assistance with smoking cessation Clean all indoor clothing, sheets, blankets, and freshen  textile furniture to rid the smell of cigarettes Only smoke outside and wear outer covering Leave cigarettes and lighters outside in separate places During in-between cigarettes, if you feel the urge to smoke, use the following: stress squeezing devices/phone a trusted friend to talk you through the urge/walk in a safe environment Avoid prolong interactions with individuals actively smoking cigarettes If you smoke in social setting, avoid social setting where cigarette smoking is expected or considered acceptable  Call 1800 QUIT NOW if in need of nicotine patches to help with cessation   Chart reviewed.   DVT prophylaxis: Enoxaparin subcutaneous Code Status: Full code Diet: Heart healthy Family Communication: Updated wife/significant other at bedside with patient's permission Disposition Plan: Pending clinical course Consults called: None at this time Admission status: Observation, telemetry medical, anticipate discharge on 12/1/*20  Past Medical History:  Diagnosis Date   Arthritis    Asthma    Hypertension    History reviewed. No pertinent surgical history.  Social History:  reports that he has been smoking cigarettes. He has a 2.50 pack-year smoking history. He has never used smokeless tobacco. He reports current alcohol use. He reports that he does not use drugs.  No Known Allergies Family History  Problem Relation Age of Onset   Hypertension Father    Family history: Family history reviewed and not pertinent  Prior to Admission medications   Medication Sig Start Date End Date Taking? Authorizing Provider  albuterol (VENTOLIN HFA) 108 (90 Base) MCG/ACT inhaler Inhale 2 puffs into the lungs once every 4 (four) hours as needed. 11/12/20   Gillis Santa, MD  amLODipine (NORVASC) 10 MG tablet Take 1 tablet (10 mg total) by mouth daily. 10/11/19 10/10/20  Burnadette Pop, MD  fluticasone furoate-vilanterol (BREO ELLIPTA) 200-25 MCG/ACT AEPB Inhale 1 puff into the lungs once daily.  11/13/20   Gillis Santa, MD  pantoprazole (PROTONIX) 40 MG tablet Take 1 tablet (40 mg total) by mouth 2 (two) times daily before a meal for 7 days. 11/12/20 11/19/20  Gillis Santa, MD  predniSONE (DELTASONE) 10 MG tablet TAKE 4 TABLETS (40MG ) BY MOUTH ONCE DAILY FOR 2 DAYS. THEN TAKE 3 TABS (30MG ) DAILY FOR 2 DAYS. THEN TAKE 2 TABS (20MG ) DAILY FOR 2 DAYS. THEN TAKE 1 TAB (10MG ) DAILY FOR 3 DAYS. 11/12/20   , MD   Physical Exam: Vitals:   12/03/20 1753 12/03/20 2023 12/03/20 2248 12/03/20 2354  BP: (!) 169/106 (!) 168/100 (!) 168/94 (!) 159/93  Pulse: (!) 102 (!) 108 98 97  Resp: 20 17 19 18   Temp:   98.5 F (36.9 C) 98.5 F (36.9 C)  TempSrc:   Oral   SpO2: 96% 98% 94% 97%  Weight:      Height:       Constitutional: appears age-appropriate, NAD, calm, comfortable Eyes: PERRL, lids and conjunctivae normal ENMT: Mucous membranes are moist. Posterior pharynx clear of any exudate or lesions. Age-appropriate dentition. Hearing appropriate Neck: normal, supple, no masses, no thyromegaly Respiratory: clear to auscultation bilaterally.  + Views wheezing, no crackles. Normal respiratory effort. No accessory muscle use.  Cardiovascular: Regular rate and rhythm, no murmurs / rubs / gallops. No extremity edema. 2+ pedal pulses. No carotid bruits.  Abdomen: Obese, no tenderness, no masses palpated, no hepatosplenomegaly. Bowel  sounds positive.  Musculoskeletal: no clubbing / cyanosis. No joint deformity upper and lower extremities. Good ROM, no contractures, no atrophy. Normal muscle tone.  Skin: no rashes, lesions, ulcers. No induration Neurologic: Sensation intact. Strength 5/5 in all 4.  Psychiatric: Normal judgment and insight. Alert and oriented x 3. Normal mood.   EKG: independently reviewed, showing sinus rhythm with rate of 100, QTc 399  Chest x-ray on Admission: I personally reviewed and I agree with radiologist reading as below.  DG Chest 2 View  Result Date:  12/03/2020 CLINICAL DATA:  Short of breath and wheezing EXAM: CHEST - 2 VIEW COMPARISON:  11/10/2020 FINDINGS: The heart size and mediastinal contours are within normal limits. Both lungs are clear. The visualized skeletal structures are unremarkable. IMPRESSION: No active cardiopulmonary disease. Electronically Signed   By: Franchot Gallo M.D.   On: 12/03/2020 16:01    Labs on Admission: I have personally reviewed following labs  CBC: Recent Labs  Lab 12/03/20 1533  WBC 7.2  HGB 12.8*  HCT 38.5*  MCV 83.7  PLT 123456   Basic Metabolic Panel: Recent Labs  Lab 12/03/20 1533 12/03/20 2354  NA 134* 134*  K 2.7* 3.3*  CL 101 102  CO2 25 23  GLUCOSE 111* 150*  BUN 15 14  CREATININE 1.05 1.02  CALCIUM 8.4* 8.5*  MG  --  2.0   GFR: Estimated Creatinine Clearance: 137.5 mL/min (by C-G formula based on SCr of 1.02 mg/dL).  Dr. Tobie Poet [facility]  If 7PM-7AM, please contact overnight-coverage provider If 7AM-7PM, please contact day coverage provider [email or web address]  12/04/2020, 12:42 AM

## 2020-12-04 NOTE — Discharge Summary (Signed)
Physician Discharge Summary  Justin Reid [id number] DOB: [birth year] DOA: 12/03/2020  PCP: Pcp, No  Admit date: 12/03/2020 Discharge date: 12/04/2020  Admitted From: Home Disposition: Home  Recommendations for Outpatient Follow-up:  Follow up with PCP in 1-2 weeks   Home Health: No Equipment/Devices: None  Discharge Condition: Stable CODE STATUS: Full Diet recommendation: Heart healthy  Brief/Interim Summary:  40 y.o. male with medical history significant for chronic pain syndrome on chronic opioid prescription, currently not on opioid medications, obesity, tobacco use, who presents to the emergency department for chief concerns of shortness of breath.   He reports that he woke up on day of admission with shortness of breath he could not catch his breath.  He denies any fever and/or known sick contacts.  He endorses nonproductive cough.  He denies chest pain, abdominal pain, dysuria, hematuria.  He does endorse diarrhea for the last 2 days.  He states that for the last 2 days he has had at least 3 episodes of diarrhea each day.  He denies nausea and vomiting.  He denies syncope, loss of consciousness.  Electrolytes replaced in house.  Patient received steroids and breathing treatments.  Responded appropriately.  Stable on room air at time of discharge.  Shortness of breath resolved.  Tolerating p.o. intake.  Potassium rechecked prior to discharge and found in reference range.  Stable for discharge home.   Discharge Diagnoses:  Principal Problem:   Hypokalemia due to excessive gastrointestinal loss of potassium Active Problems:   Hypokalemia   Shortness of breath   At risk for sleep apnea   Obesity hypoventilation syndrome (HCC)  Shortness of breath secondary to COVID-19 Patient is unvaccinated Received duo nebs and Solu-Medrol Improvement in respiratory status At time of discharge will recommend prednisone 40 mg a day x5 days, as needed albuterol nebulizer, Tessalon  Perles  Hypokalemia Secondary to GI losses in the setting of COVID-19 gastroenteritis.  Replaced in house.  Recheck prior to discharge and found to be within reference range.  Tobacco use disorder Counseled on cessation  Discharge Instructions  Discharge Instructions     Diet - low sodium heart healthy   Complete by: As directed    Increase activity slowly   Complete by: As directed       Allergies as of 12/04/2020   No Known Allergies      Medication List     TAKE these medications    albuterol 108 (90 Base) MCG/ACT inhaler Commonly known as: VENTOLIN HFA Inhale 2 puffs into the lungs once every 4 (four) hours as needed.   amLODipine 10 MG tablet Commonly known as: NORVASC Take 1 tablet (10 mg total) by mouth daily.   benzonatate 200 MG capsule Commonly known as: TESSALON Take 1 capsule (200 mg total) by mouth 3 (three) times daily for 10 days.   Breo Ellipta 200-25 MCG/ACT Aepb Generic drug: fluticasone furoate-vilanterol Inhale 1 puff into the lungs once daily.   pantoprazole 40 MG tablet Commonly known as: PROTONIX Take 1 tablet (40 mg total) by mouth 2 (two) times daily before a meal for 7 days.   predniSONE 20 MG tablet Commonly known as: DELTASONE Take 2 tablets (40 mg total) by mouth daily for 4 days. What changed:  medication strength how much to take how to take this when to take this additional instructions        No Known Allergies  Consultations: None   Procedures/Studies: DG Chest 2 View  Result Date: 12/03/2020 CLINICAL DATA:  Short of breath and wheezing EXAM: CHEST - 2 VIEW COMPARISON:  11/10/2020 FINDINGS: The heart size and mediastinal contours are within normal limits. Both lungs are clear. The visualized skeletal structures are unremarkable. IMPRESSION: No active cardiopulmonary disease. Electronically Signed   By: Franchot Gallo M.D.   On: 12/03/2020 16:01   CT Angio Chest Pulmonary Embolism (PE) W or WO  Contrast  Result Date: 11/10/2020 CLINICAL DATA:  Concern for pulmonary embolism. EXAM: CT ANGIOGRAPHY CHEST WITH CONTRAST TECHNIQUE: Multidetector CT imaging of the chest was performed using the standard protocol during bolus administration of intravenous contrast. Multiplanar CT image reconstructions and MIPs were obtained to evaluate the vascular anatomy. CONTRAST:  36mL OMNIPAQUE IOHEXOL 350 MG/ML SOLN COMPARISON:  Chest radiograph dated 05/08/2020 and 11/10/2020. FINDINGS: Cardiovascular: There is no cardiomegaly or pericardial effusion. The thoracic aorta is unremarkable. 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 Evaluation of the pulmonary arteries is limited due suboptimal opacification and timing of the contrast. No pulmonary artery embolus identified. Mediastinum/Nodes: There is no hilar or mediastinal adenopathy. The esophagus is grossly unremarkable. No mediastinal fluid collection. Lungs/Pleura: The lungs are clear. There is no pleural effusion pneumothorax. The central airways are patent. Upper Abdomen: Partially visualized 2.5 cm left renal upper pole cyst. Musculoskeletal: No chest wall abnormality. No acute or significant osseous findings. Review of the MIP images confirms the above findings. IMPRESSION: 1. No acute intrathoracic pathology. No CT evidence of pulmonary embolism. 2. Left-sided aortic arch with aberrant right subclavian artery anatomy. Electronically Signed   By: Anner Crete M.D.   On: 11/10/2020 20:22   DG Chest Portable 1 View  Result Date: 11/10/2020 CLINICAL DATA:  Cough, shortness of breath. Additional history provided: Patient reports cough, shortness of breath, chest pain, wheezing. Cough productive of yellow sputum. EXAM: PORTABLE CHEST 1 VIEW COMPARISON:  Prior chest radiographs 10/29/2020 and earlier. FINDINGS: Shallow inspiration radiograph. Heart size within normal limits. No appreciable airspace consolidation. No evidence  of pleural effusion or pneumothorax. No acute bony abnormality identified. Degenerative changes of the spine. IMPRESSION: Shallow inspiration radiograph. No evidence of acute cardiopulmonary abnormality. Electronically Signed   By: Kellie Simmering D.O.   On: 11/10/2020 11:51      Subjective: Seen and examined at the day of discharge.  Stable no distress.  Tolerating p.o. intake.  Shortness of breath resolved.  Stable for discharge home.  Discharge Exam: Vitals:   12/04/20 0727 12/04/20 0937  BP: (!) 157/105 (!) 150/115  Pulse: 90 94  Resp: (!) 22 (!) 22  Temp:    SpO2: 95% 95%   Vitals:   12/03/20 2248 12/03/20 2354 12/04/20 0727 12/04/20 0937  BP: (!) 168/94 (!) 159/93 (!) 157/105 (!) 150/115  Pulse: 98 97 90 94  Resp: 19 18 (!) 22 (!) 22  Temp: 98.5 F (36.9 C) 98.5 F (36.9 C)    TempSrc: Oral     SpO2: 94% 97% 95% 95%  Weight:      Height:        General: Pt is alert, awake, not in acute distress Cardiovascular: RRR, S1/S2 +, no rubs, no gallops Respiratory: CTA bilaterally, no wheezing, no rhonchi Abdominal: Soft, NT, ND, bowel sounds + Extremities: no edema, no cyanosis    The results of significant diagnostics from this hospitalization (including imaging, microbiology, ancillary and laboratory) are listed below for reference.     Microbiology: Recent Results (from the past 240 hour(s))  Resp Panel by RT-PCR (Flu A&B, Covid)  Nasopharyngeal Swab     Status: Abnormal   Collection Time: 12/03/20  4:33 PM   Specimen: Nasopharyngeal Swab; Nasopharyngeal(NP) swabs in vial transport medium  Result Value Ref Range Status   SARS Coronavirus 2 by RT PCR POSITIVE (A) NEGATIVE Final    Comment: RESULT CALLED TO, READ BACK BY AND VERIFIED WITHApolinar Junes: BRANDON GROGG @ [id number] 12/03/20 L[facility] (NOTE) SARS-CoV-2 target nucleic acids are DETECTED.  The SARS-CoV-2 RNA is generally detectable in upper respiratory specimens during the acute phase of infection. Positive results are indicative  of the presence of the identified virus, but do not rule out bacterial infection or co-infection with other pathogens not detected by the test. Clinical correlation with patient history and other diagnostic information is necessary to determine patient infection status. The expected result is Negative.  Fact Sheet for Patients: BloggerCourse.comhttps://www.fda.gov/media/152166/download  Fact Sheet for Healthcare Providers: SeriousBroker.ithttps://www.fda.gov/media/152162/download  This test is not yet approved or cleared by the [place] FDA and  has been authorized for detection and/or diagnosis of SARS-CoV-2 by FDA under an Emergency Use Authorization (EUA).  This EUA will remain in effect (meaning this test can be  used) for the duration of  the COVID-19 declaration under Section 564(b)(1) of the Act, 21 U.S.C. section 360bbb-3(b)(1), unless the authorization is terminated or revoked sooner.     Influenza A by PCR NEGATIVE NEGATIVE Final   Influenza B by PCR NEGATIVE NEGATIVE Final    Comment: (NOTE) The Xpert Xpress SARS-CoV-2/FLU/RSV plus assay is intended as an aid in the diagnosis of influenza from Nasopharyngeal swab specimens and should not be used as a sole basis for treatment. Nasal washings and aspirates are unacceptable for Xpert Xpress SARS-CoV-2/FLU/RSV testing.  Fact Sheet for Patients: BloggerCourse.comhttps://www.fda.gov/media/152166/download  Fact Sheet for Healthcare Providers: SeriousBroker.ithttps://www.fda.gov/media/152162/download  This test is not yet approved or cleared by the [place] FDA and has been authorized for detection and/or diagnosis of SARS-CoV-2 by FDA under an Emergency Use Authorization (EUA). This EUA will remain in effect (meaning this test can be used) for the duration of the COVID-19 declaration under Section 564(b)(1) of the Act, 21 U.S.C. section 360bbb-3(b)(1), unless the authorization is terminated or revoked.  Performed at [facility], [address]  [place], [place] [id number]      Labs: BNP (last 3 results) No results for input(s): BNP in the last 8760 hours. Basic Metabolic Panel: Recent Labs  Lab 12/03/20 1533 12/03/20 2354 12/04/20 0824  NA 134* 134*  --   K 2.7* 3.3* 4.1  CL 101 102  --   CO2 25 23  --   GLUCOSE 111* 150*  --   BUN 15 14  --   CREATININE 1.05 1.02  --   CALCIUM 8.4* 8.5*  --   MG  --  2.0  --    Liver Function Tests: No results for input(s): AST, ALT, ALKPHOS, BILITOT, PROT, ALBUMIN in the last 168 hours. No results for input(s): LIPASE, AMYLASE in the last 168 hours. No results for input(s): AMMONIA in the last 168 hours. CBC: Recent Labs  Lab 12/03/20 1533  WBC 7.2  HGB 12.8*  HCT 38.5*  MCV 83.7  PLT 371   Cardiac Enzymes: No results for input(s): CKTOTAL, CKMB, CKMBINDEX, TROPONINI in the last 168 hours. BNP: Invalid input(s): POCBNP CBG: No results for input(s): GLUCAP in the last 168 hours. D-Dimer No results for input(s): DDIMER in the last 72 hours. Hgb A1c No results for input(s): HGBA1C in the last 72 hours.  Lipid Profile No results for input(s): CHOL, HDL, LDLCALC, TRIG, CHOLHDL, LDLDIRECT in the last 72 hours. Thyroid function studies No results for input(s): TSH, T4TOTAL, T3FREE, THYROIDAB in the last 72 hours.  Invalid input(s): FREET3 Anemia work up No results for input(s): VITAMINB12, FOLATE, FERRITIN, TIBC, IRON, RETICCTPCT in the last 72 hours. Urinalysis No results found for: COLORURINE, APPEARANCEUR, LABSPEC, PHURINE, GLUCOSEU, HGBUR, BILIRUBINUR, KETONESUR, PROTEINUR, UROBILINOGEN, NITRITE, LEUKOCYTESUR Sepsis Labs Invalid input(s): PROCALCITONIN,  WBC,  LACTICIDVEN Microbiology Recent Results (from the past 240 hour(s))  Resp Panel by RT-PCR (Flu A&B, Covid) Nasopharyngeal Swab     Status: Abnormal   Collection Time: 12/03/20  4:33 PM   Specimen: Nasopharyngeal Swab; Nasopharyngeal(NP) swabs in vial transport medium  Result Value Ref Range Status   SARS  Coronavirus 2 by RT PCR POSITIVE (A) NEGATIVE Final    Comment: RESULT CALLED TO, READ BACK BY AND VERIFIED WITHApolinar Junes GROGG @ 1833 12/03/20 LFD (NOTE) SARS-CoV-2 target nucleic acids are DETECTED.  The SARS-CoV-2 RNA is generally detectable in upper respiratory specimens during the acute phase of infection. Positive results are indicative of the presence of the identified virus, but do not rule out bacterial infection or co-infection with other pathogens not detected by the test. Clinical correlation with patient history and other diagnostic information is necessary to determine patient infection status. The expected result is Negative.  Fact Sheet for Patients: BloggerCourse.com  Fact Sheet for Healthcare Providers: SeriousBroker.it  This test is not yet approved or cleared by the [place] FDA and  has been authorized for detection and/or diagnosis of SARS-CoV-2 by FDA under an Emergency Use Authorization (EUA).  This EUA will remain in effect (meaning this test can be  used) for the duration of  the COVID-19 declaration under Section 564(b)(1) of the Act, 21 U.S.C. section 360bbb-3(b)(1), unless the authorization is terminated or revoked sooner.     Influenza A by PCR NEGATIVE NEGATIVE Final   Influenza B by PCR NEGATIVE NEGATIVE Final    Comment: (NOTE) The Xpert Xpress SARS-CoV-2/FLU/RSV plus assay is intended as an aid in the diagnosis of influenza from Nasopharyngeal swab specimens and should not be used as a sole basis for treatment. Nasal washings and aspirates are unacceptable for Xpert Xpress SARS-CoV-2/FLU/RSV testing.  Fact Sheet for Patients: BloggerCourse.com  Fact Sheet for Healthcare Providers: SeriousBroker.it  This test is not yet approved or cleared by the [place] FDA and has been authorized for detection and/or diagnosis of SARS-CoV-2  by FDA under an Emergency Use Authorization (EUA). This EUA will remain in effect (meaning this test can be used) for the duration of the COVID-19 declaration under Section 564(b)(1) of the Act, 21 U.S.C. section 360bbb-3(b)(1), unless the authorization is terminated or revoked.  Performed at [facility], [address] [place], [place] [address]      Time coordinating discharge: Over 30 minutes  SIGNED:   Tresa Moore, MD  [facility] 12/04/2020, 1:40 PM Pager   If 7PM-7AM, please contact night-coverage

## 2020-12-04 NOTE — ED Notes (Signed)
EDT answered pt's call bell. Pt requesting to be discharged. Admitting MD messaged in regard to pt concern.

## 2021-01-15 ENCOUNTER — Emergency Department
Admission: EM | Admit: 2021-01-15 | Discharge: 2021-01-15 | Disposition: A | Discharge status: Home / Self Care | Payer: Self-pay | Attending: Emergency Medicine | Admitting: Emergency Medicine

## 2021-01-15 ENCOUNTER — Other Ambulatory Visit: Payer: Self-pay

## 2021-01-15 ENCOUNTER — Emergency Department: Payer: Self-pay

## 2021-01-15 ENCOUNTER — Encounter: Payer: Self-pay | Admitting: Emergency Medicine

## 2021-01-15 DIAGNOSIS — J45909 Unspecified asthma, uncomplicated: Secondary | ICD-10-CM | POA: Insufficient documentation

## 2021-01-15 DIAGNOSIS — R1084 Generalized abdominal pain: Secondary | ICD-10-CM | POA: Insufficient documentation

## 2021-01-15 DIAGNOSIS — R112 Nausea with vomiting, unspecified: Secondary | ICD-10-CM | POA: Insufficient documentation

## 2021-01-15 DIAGNOSIS — I10 Essential (primary) hypertension: Secondary | ICD-10-CM | POA: Insufficient documentation

## 2021-01-15 LAB — COMPREHENSIVE METABOLIC PANEL
ALT: 19 U/L (ref 0–44)
AST: 19 U/L (ref 15–41)
Albumin: 3.8 g/dL (ref 3.5–5.0)
Alkaline Phosphatase: 56 U/L (ref 38–126)
Anion gap: 10 (ref 5–15)
BUN: 16 mg/dL (ref 6–20)
CO2: 24 mmol/L (ref 22–32)
Calcium: 9.1 mg/dL (ref 8.9–10.3)
Chloride: 99 mmol/L (ref 98–111)
Creatinine, Ser: 0.83 mg/dL (ref 0.61–1.24)
GFR, Estimated: >60 mL/min (ref >60)
Glucose, Bld: 109 mg/dL — ABNORMAL HIGH (ref 70–99)
Potassium: 4.1 mmol/L (ref 3.5–5.1)
Sodium: 133 mmol/L — ABNORMAL LOW (ref 135–145)
Total Bilirubin: 0.7 mg/dL (ref 0.3–1.2)
Total Protein: 7.6 g/dL (ref 6.5–8.1)

## 2021-01-15 LAB — CBC
HCT: 41.9 % (ref 39.0–52.0)
Hemoglobin: 13.4 g/dL (ref 13.0–17.0)
MCH: 26.8 pg (ref 26.0–34.0)
MCHC: 32 g/dL (ref 30.0–36.0)
MCV: 83.8 fL (ref 80.0–100.0)
Platelets: 424 10*3/uL — ABNORMAL HIGH (ref 150–400)
RBC: 5 MIL/uL (ref 4.22–5.81)
RDW: 14.2 % (ref 11.5–15.5)
WBC: 8 10*3/uL (ref 4.0–10.5)
nRBC: 0 % (ref 0.0–0.2)

## 2021-01-15 LAB — LIPASE, BLOOD: Lipase: 25 U/L (ref 11–51)

## 2021-01-15 LAB — TROPONIN I (HIGH SENSITIVITY): Troponin I (High Sensitivity): 4 ng/L (ref <18)

## 2021-01-15 MED ORDER — MORPHINE SULFATE (PF) 4 MG/ML IV SOLN
4.0000 mg | Freq: Once | INTRAVENOUS | Status: AC
  Administered 2021-01-15: 4 mg via INTRAVENOUS
  Filled 2021-01-15: qty 1

## 2021-01-15 MED ORDER — ONDANSETRON HCL 4 MG/2ML IJ SOLN
4.0000 mg | Freq: Once | INTRAMUSCULAR | Status: AC
Start: 2021-01-15 — End: 2021-01-15
  Administered 2021-01-15: 4 mg via INTRAVENOUS
  Filled 2021-01-15: qty 2

## 2021-01-15 MED ORDER — ONDANSETRON HCL 4 MG PO TABS: 4.0000 mg | ORAL_TABLET | Freq: Three times a day (TID) | ORAL | 0 refills | Status: DC | PRN

## 2021-01-15 MED ORDER — IOHEXOL 300 MG/ML  SOLN
80.0000 mL | Freq: Once | INTRAMUSCULAR | Status: DC | PRN
  Filled 2021-01-15: qty 80

## 2021-01-15 MED ORDER — IOHEXOL 300 MG/ML  SOLN
100.0000 mL | Freq: Once | INTRAMUSCULAR | Status: AC | PRN
  Administered 2021-01-15: 100 mL via INTRAVENOUS
  Filled 2021-01-15: qty 100

## 2021-01-15 MED ORDER — PANTOPRAZOLE SODIUM 40 MG IV SOLR
40.0000 mg | Freq: Once | INTRAVENOUS | Status: AC
  Administered 2021-01-15: 40 mg via INTRAVENOUS
  Filled 2021-01-15: qty 40

## 2021-01-15 NOTE — ED Notes (Signed)
This RN attempted to obtain PIV access without success after student attempted unsuccessfully.

## 2021-01-15 NOTE — ED Triage Notes (Signed)
Pt comes into the ED via POV c/o generalized abd pain that started last night.  Pt admits to nausea but denies any episodes of emesis.  Pt in NAD at this time with even and unlabored respirations.

## 2021-01-15 NOTE — ED Notes (Signed)
D/C and reasons to return to ED discussed with pt, pt verbalized understanding. NAD noted.Pt ambulatory on D/C.  

## 2021-01-15 NOTE — ED Provider Notes (Signed)
[facility] Provider Note    Event Date/Time   First MD Initiated Contact with Patient 01/15/21 1322     (approximate)   History   Abdominal Pain   HPI  Justin Reid is a 41 y.o. male with a past medical history of asthma, chronic pain syndrome, obesity, and tobacco abuse who presents for assessment of some generalized abdominal pain associate with nausea and 2 episodes of nonbloody nonbilious vomiting that started yesterday.  Patient states he was worried he might have food poisoning or there was not sure.  He had some pork Last night but this is not out of the ordinary for him.  He states he had about a cup to a cup and a half of some liquor but is not a regular drinker and this was a one-time occasion.  He states he also took some a Profen but this did not help much and he is not a regular NSAID user.  He denies any constipation, diarrhea, urinary symptoms, chest pain, cough, shortness of breath, back pain, headache, earache, sore throat rash or extremity pain.  No recent falls or injuries.  He denies illicit drug use or tobacco abuse.  Denies any known history of hypertension.  No other acute concerns at this time.      Physical Exam  Triage Vital Signs: ED Triage Vitals  Enc Vitals Group     BP 01/15/21 1212 (!) 160/93     Pulse Rate 01/15/21 1212 87     Resp 01/15/21 1212 (!) 22     Temp 01/15/21 1212 98.3 F (36.8 C)     Temp Source 01/15/21 1212 Oral     SpO2 01/15/21 1212 95 %     Weight 01/15/21 1204 (!) 302 lb 0.5 oz (137 kg)     Height 01/15/21 1204 5\' 11"  (1.803 m)     Head Circumference --      Peak Flow --      Pain Score 01/15/21 1204 8     Pain Loc --      Pain Edu? --      Excl. in GC? --     Most recent vital signs: Vitals:   01/15/21 1212  BP: (!) 160/93  Pulse: 87  Resp: (!) 22  Temp: 98.3 F (36.8 C)  SpO2: 95%    General: Awake, appears mildly uncomfortable. CV:  Good peripheral perfusion.  No murmurs rubs or  gallops.  2+ radial pulses. Resp:  Normal effort.  Clear bilaterally. Abd:  No distention.  Mildly tender throughout. Other:  No CVA tenderness.   ED Results / Procedures / Treatments  Labs (all labs ordered are listed, but only abnormal results are displayed) Labs Reviewed  COMPREHENSIVE METABOLIC PANEL - Abnormal; Notable for the following components:      Result Value   Sodium 133 (*)    Glucose, Bld 109 (*)    All other components within normal limits  CBC - Abnormal; Notable for the following components:   Platelets 424 (*)    All other components within normal limits  LIPASE, BLOOD  URINALYSIS, ROUTINE W REFLEX MICROSCOPIC  TROPONIN I (HIGH SENSITIVITY)  TROPONIN I (HIGH SENSITIVITY)     EKG  EKG remarkable for sinus rhythm with a ventricular rate of 85 and some artifact versus nonspecific change in lead I, lead III, and aVF without other clear evidence of acute ischemia or significant arrhythmia.   RADIOLOGY  CT abdomen pelvis ordered and reviewed  by myself shows stable left-sided 1 mm nonobstructing kidney stone and adjacent simple cyst.  There are no no ureteral stones, hydronephrosis, appendicitis, diverticulitis, pancreatitis, cholecystitis or other acute abdominal pelvic process noted.  PROCEDURES:    MEDICATIONS ORDERED IN ED: Medications  ondansetron (ZOFRAN) injection 4 mg (4 mg Intravenous Given 01/15/21 1422)  pantoprazole (PROTONIX) injection 40 mg (40 mg Intravenous Given 01/15/21 1423)  morphine 4 MG/ML injection 4 mg (4 mg Intravenous Given 01/15/21 1422)  iohexol (OMNIPAQUE) 300 MG/ML solution 100 mL (100 mLs Intravenous Contrast Given 01/15/21 1430)     IMPRESSION / MDM / ASSESSMENT AND PLAN / ED COURSE  I reviewed the triage vital signs and the nursing notes.                              Differential diagnosis includes, but is not limited to cholecystitis, pancreatitis, diverticulitis, acute infectious gastritis, peptic ulcer disease,  appendicitis, kidney stone, AAA, and possibly atypical presentation from ACS  EKG remarkable for sinus rhythm with a ventricular rate of 85 and some artifact versus nonspecific change in lead I, lead III, and aVF without other clear evidence of acute ischemia or significant arrhythmia.  Nonelevated troponin is not suggestive of atypical presentation for ACS.  CMP shows no significant electrolyte or metabolic derangements.  Lipase not consistent with acute pancreatitis.  CBC shows no leukocytosis or acute anemia.  CT abdomen pelvis ordered and reviewed by myself shows stable left-sided 1 mm nonobstructing kidney stone and adjacent simple cyst.  There are no no ureteral stones, hydronephrosis, appendicitis, diverticulitis, pancreatitis, cholecystitis or other acute abdominal pelvic process noted.  On reassessment patient states she is feeling completely better.  He is able tolerate p.o. without difficulty.  Advised him that I have not obtained a urine sample yet and I cannot complete rule of cystitis although he states he has no urinary symptoms and does not think it was related to this at all.  He states he wishes to be discharged.  I think this is reasonable given absence of any urinary symptoms or tenderness on my reassessment of his abdominal exam.  I suspect likely acute gastritis likely infectious.  Will prescribe short course of Zofran.  Discharged in stable condition.  Strict return precautions advised and discussed.  Emphasized importance of having PCP recheck his blood pressure in 5 to 7 days.      FINAL CLINICAL IMPRESSION(S) / ED DIAGNOSES   Final diagnoses:  Generalized abdominal pain  Nausea and vomiting, unspecified vomiting type  Hypertension, unspecified type     Rx / DC Orders   ED Discharge Orders          Ordered    ondansetron (ZOFRAN) 4 MG tablet  Every 8 hours PRN        01/15/21 1511             Note:  This document was prepared using Dragon voice recognition  software and may include unintentional dictation errors.   Gilles Chiquito, MD 01/15/21 1515

## 2021-02-09 ENCOUNTER — Other Ambulatory Visit: Payer: Self-pay

## 2021-03-16 ENCOUNTER — Emergency Department
Admission: EM | Admit: 2021-03-16 | Discharge: 2021-03-16 | Disposition: A | Discharge status: Home / Self Care | Payer: Self-pay | Attending: Emergency Medicine | Admitting: Emergency Medicine

## 2021-03-16 ENCOUNTER — Other Ambulatory Visit: Payer: Self-pay

## 2021-03-16 DIAGNOSIS — M5441 Lumbago with sciatica, right side: Secondary | ICD-10-CM | POA: Insufficient documentation

## 2021-03-16 DIAGNOSIS — M5431 Sciatica, right side: Secondary | ICD-10-CM

## 2021-03-16 MED ORDER — MELOXICAM 15 MG PO TABS: 15.0000 mg | ORAL_TABLET | Freq: Every day | ORAL | 0 refills | Status: DC

## 2021-03-16 MED ORDER — METHOCARBAMOL 500 MG PO TABS: 500.0000 mg | ORAL_TABLET | Freq: Four times a day (QID) | ORAL | 0 refills | Status: DC

## 2021-03-16 MED ORDER — ORPHENADRINE CITRATE 30 MG/ML IJ SOLN
60.0000 mg | Freq: Once | INTRAMUSCULAR | Status: AC
Start: 2021-03-16 — End: 2021-03-16
  Administered 2021-03-16: 60 mg via INTRAMUSCULAR
  Filled 2021-03-16: qty 2

## 2021-03-16 MED ORDER — KETOROLAC TROMETHAMINE 30 MG/ML IJ SOLN
30.0000 mg | Freq: Once | INTRAMUSCULAR | Status: AC
  Administered 2021-03-16: 30 mg via INTRAMUSCULAR
  Filled 2021-03-16: qty 1

## 2021-03-16 NOTE — ED Provider Notes (Signed)
? ?[facility] ?Provider Note ? ?Patient Contact: 8:24 PM (approximate) ? ? ?History  ? ?Back Pain ? ? ?HPI ? ?Justin Reid is a 41 y.o. male who presents the emergency department with right lower back pain radiating down the right leg.  Patient has had no recent illness, trauma or accidents.  Patient does report increased pain with movement.  There is no urinary changes with hematuria, polyuria, dysuria.  Believes that this is likely originating from his back.  No saddle anesthesia, paresthesias, loss of sensation, loss of bowel or bladder control. ?  ? ? ?Physical Exam  ? ?Triage Vital Signs: ?ED Triage Vitals [03/16/21 1741]  ?Enc Vitals Group  ?   BP (!) 135/102  ?   Pulse Rate 84  ?   Resp 18  ?   Temp 98.5 ?F (36.9 ?C)  ?   Temp Source Oral  ?   SpO2 97 %  ?   Weight (!) 375 lb (170.1 kg)  ?   Height 5\' 11"  (1.803 m)  ?   Head Circumference   ?   Peak Flow   ?   Pain Score 10  ?   Pain Loc   ?   Pain Edu?   ?   Excl. in [place]?   ? ? ?Most recent vital signs: ?Vitals:  ? 03/16/21 1741  ?BP: (!) 135/102  ?Pulse: 84  ?Resp: 18  ?Temp: 98.5 ?F (36.9 ?C)  ?SpO2: 97%  ? ? ? ?General: Alert and in no acute distress.  ?Cardiovascular:  Good peripheral perfusion ?Respiratory: Normal respiratory effort without tachypnea or retractions. Lungs CTAB. Good air entry to the bases with no decreased or absent breath sounds. ?Gastrointestinal: Bowel sounds ?4 quadrants. Soft and nontender to palpation. No guarding or rigidity. No palpable masses. No distention. No CVA tenderness. ?Musculoskeletal: Full range of motion to all extremities.  Visualization of spine reveals no visible signs of trauma.  No tenderness midline to left side but is tender along the right paraspinal muscle group extending into the SI joint and sciatic notch.  Dorsalis pedis pulse and sensation intact and equal bilateral lower extremities ?Neurologic:  No gross focal neurologic deficits are appreciated.  ?Skin:   No rash  noted ?Other: ? ? ?ED Results / Procedures / Treatments  ? ?Labs ?(all labs ordered are listed, but only abnormal results are displayed) ?Labs Reviewed - No data to display ? ? ?EKG ? ? ? ? ?RADIOLOGY ? ? ? ?No results found. ? ?PROCEDURES: ? ?Critical Care performed: No ? ?Procedures ? ? ?MEDICATIONS ORDERED IN ED: ?Medications  ?ketorolac (TORADOL) 30 MG/ML injection 30 mg (has no administration in time range)  ?orphenadrine (NORFLEX) injection 60 mg (has no administration in time range)  ? ? ? ?IMPRESSION / MDM / ASSESSMENT AND PLAN / ED COURSE  ?I reviewed the triage vital signs and the nursing notes. ?             ?               ? ?Differential diagnosis includes, but is not limited to, sciatica, lumbar strain, nephrolithiasis, UTI, pyelonephritis ? ? ?Patient's diagnosis is consistent with lumbar strain with sciatica.  Patient presented to the emergency department with right lower back pain radiating down the leg.  Findings on physical exam are consistent with muscle spasm with sciatica.  No concerning neuro deficits warranting further investigation with imaging.  Patient has no urinary or GI complaints.  Patient will have Toradol, Norflex here in the emergency department.  Will be prescribed meloxicam and Robaxin at home.  Follow-up primary care as needed.  Return precautions discussed with the patient. Patient is given ED precautions to return to the ED for any worsening or new symptoms. ? ? ? ?  ? ? ?FINAL CLINICAL IMPRESSION(S) / ED DIAGNOSES  ? ?Final diagnoses:  ?Sciatica of right side  ? ? ? ?Rx / DC Orders  ? ?ED Discharge Orders   ? ?      Ordered  ?  meloxicam (MOBIC) 15 MG tablet  Daily       ? 03/16/21 2038  ?  methocarbamol (ROBAXIN) 500 MG tablet  4 times daily       ? 03/16/21 2038  ? ?  ?  ? ?  ? ? ? ?Note:  This document was prepared using Dragon voice recognition software and may include unintentional dictation errors. ?  ?Darletta Moll, PA-C ?03/16/21 2040 ? ?  ?Vanessa [place],  MD ?03/16/21 2328 ? ?

## 2021-03-16 NOTE — ED Triage Notes (Signed)
Pt to ED via POV from home. Pt c/o right lower back pain that radiates down the right buttock and leg that started 3 days ago. Pt denies any injury or fall.  ?

## 2021-03-18 ENCOUNTER — Other Ambulatory Visit: Payer: Self-pay

## 2021-04-10 ENCOUNTER — Telehealth: Payer: Self-pay | Admitting: Pharmacist

## 2021-04-10 NOTE — Telephone Encounter (Signed)
Patient failed to provide requested 2022 financial documentation. No additional medication assistance will be provided by [facility] without the required proof of income documentation. Patient notified by letter. Debra Cheek Administrative Assistant [facility] 

## 2021-04-13 ENCOUNTER — Emergency Department
Admission: EM | Admit: 2021-04-13 | Discharge: 2021-04-13 | Disposition: A | Discharge status: Home / Self Care | Payer: Self-pay | Attending: Emergency Medicine | Admitting: Emergency Medicine

## 2021-04-13 ENCOUNTER — Other Ambulatory Visit: Payer: Self-pay

## 2021-04-13 ENCOUNTER — Emergency Department: Payer: Self-pay

## 2021-04-13 DIAGNOSIS — M25511 Pain in right shoulder: Secondary | ICD-10-CM | POA: Insufficient documentation

## 2021-04-13 DIAGNOSIS — S46001A Unspecified injury of muscle(s) and tendon(s) of the rotator cuff of right shoulder, initial encounter: Secondary | ICD-10-CM | POA: Insufficient documentation

## 2021-04-13 DIAGNOSIS — X500XXA Overexertion from strenuous movement or load, initial encounter: Secondary | ICD-10-CM | POA: Insufficient documentation

## 2021-04-13 MED ORDER — BACLOFEN 10 MG PO TABS: 10.0000 mg | ORAL_TABLET | Freq: Three times a day (TID) | ORAL | 0 refills | Status: AC

## 2021-04-13 MED ORDER — TRAMADOL HCL 50 MG PO TABS
50.0000 mg | ORAL_TABLET | Freq: Four times a day (QID) | ORAL | 0 refills | Status: DC | PRN
Start: 2021-04-13 — End: 2021-06-22

## 2021-04-13 MED ORDER — OXYCODONE-ACETAMINOPHEN 5-325 MG PO TABS
1.0000 | ORAL_TABLET | Freq: Once | ORAL | Status: AC
  Administered 2021-04-13: 1 via ORAL
  Filled 2021-04-13: qty 1

## 2021-04-13 MED ORDER — MELOXICAM 15 MG PO TABS: 15.0000 mg | ORAL_TABLET | Freq: Every day | ORAL | 2 refills | Status: DC

## 2021-04-13 NOTE — ED Provider Notes (Signed)
? ?[facility] ?Provider Note ? ? ? Event Date/Time  ? First MD Initiated Contact with Patient 04/13/21 1104   ?  (approximate) ? ? ?History  ? ?Shoulder Pain ? ? ?HPI ? ?Justin Reid is a 41 y.o. male with history of right shoulder pain presents to the emergency department complaint of right shoulder pain.  Patient states he always has trouble with the shoulder but it worsened after he helped someone move furniture this weekend.  Has burning in the right shoulder.  Radiates into the neck.  Has decreased range of motion of the shoulder.  No numbness or tingling.  Symptoms been ongoing for 2 to 3 days. ? ?  ? ? ?Physical Exam  ? ?Triage Vital Signs: ?ED Triage Vitals  ?Enc Vitals Group  ?   BP 04/13/21 1101 (!) 149/117  ?   Pulse Rate 04/13/21 1101 80  ?   Resp 04/13/21 1101 20  ?   Temp 04/13/21 1101 98.4 ?F (36.9 ?C)  ?   Temp Source 04/13/21 1101 Oral  ?   SpO2 04/13/21 1101 97 %  ?   Weight 04/13/21 1110 (!) 374 lb 12.5 oz (170 kg)  ?   Height 04/13/21 1110 5\' 11"  (1.803 m)  ?   Head Circumference --   ?   Peak Flow --   ?   Pain Score --   ?   Pain Loc --   ?   Pain Edu? --   ?   Excl. in [place]? --   ? ? ?Most recent vital signs: ?Vitals:  ? 04/13/21 1101  ?BP: (!) 149/117  ?Pulse: 80  ?Resp: 20  ?Temp: 98.4 ?F (36.9 ?C)  ?SpO2: 97%  ? ? ? ?General: Awake, no distress.   ?CV:  Good peripheral perfusion. regular rate and  rhythm ?Resp:  Normal effort.  ?Abd:  No distention.   ?Other:  Right shoulder is tender along the rotator cuff, decreased range of motion with external and internal rotation, decreased range of motion with overhead reach, grips are equal bilaterally, neurovascular is intact ? ? ?ED Results / Procedures / Treatments  ? ?Labs ?(all labs ordered are listed, but only abnormal results are displayed) ?Labs Reviewed - No data to display ? ? ?EKG ? ? ? ? ?RADIOLOGY ?X-ray of the right shoulder ? ? ? ?PROCEDURES: ? ? ?Procedures ? ? ?MEDICATIONS ORDERED IN ED: ?Medications   ?oxyCODONE-acetaminophen (PERCOCET/ROXICET) 5-325 MG per tablet 1 tablet (1 tablet Oral Given 04/13/21 1114)  ? ? ? ?IMPRESSION / MDM / ASSESSMENT AND PLAN / ED COURSE  ?I reviewed the triage vital signs and the nursing notes. ?             ?               ? ?Differential diagnosis includes, but is not limited to, rotator cuff tear, rotator cuff strain, muscle strain, osteoarthritis of the right shoulder, dislocation of the right shoulder ? ?X-ray of the right shoulder ordered ? ?Plan at this time is to evaluate the x-ray, give pain medication, and place in a sling. ? ?X-ray of the right shoulder was independently reviewed by me.  Not seen acute fracture.  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 ? ?I did explain these findings to the patient.  Is placed in a sling.  He was instructed to use ice on the right shoulder.  He was given a prescription for meloxicam, baclofen,  and tramadol.  Return emergency department worsening.  Follow-up with orthopedics.  He was given a work note discharge stable condition. ? ? ?  ? ? ?FINAL CLINICAL IMPRESSION(S) / ED DIAGNOSES  ? ?Final diagnoses:  ?Injury of right rotator cuff, initial encounter  ? ? ? ?Rx / DC Orders  ? ?ED Discharge Orders   ? ?      Ordered  ?  meloxicam (MOBIC) 15 MG tablet  Daily       ? 04/13/21 1202  ?  baclofen (LIORESAL) 10 MG tablet  3 times daily       ? 04/13/21 1202  ?  traMADol (ULTRAM) 50 MG tablet  Every 6 hours PRN       ? 04/13/21 1202  ? ?  ?  ? ?  ? ? ? ?Note:  This document was prepared using Dragon voice recognition software and may include unintentional dictation errors. ? ?  ?Faythe Ghee, PA-C ?04/13/21 1205 ? ?  ?Jene Every, MD ?04/13/21 1322 ? ?

## 2021-04-13 NOTE — ED Triage Notes (Signed)
Pt states he has chronic issues with his shoulder but helped a friend move furniture on Thursday and since having increased burning right shoulder pain. ?

## 2021-04-13 NOTE — ED Notes (Signed)
See triage note   presents with pain to right shoulder   states pain started at shoulder and moves into neck   no deformity noted   ?

## 2021-04-13 NOTE — Discharge Instructions (Signed)
Follow-up with orthopedics.  Please call for an appointment.  Take medication as prescribed.  Wear the sling for 3 to 4 days.  If you have not improved at that time please put the sling back on and follow-up with Ortho soon as possible.  Apply ice to the right shoulder.  Return if worsening ?

## 2021-04-15 ENCOUNTER — Other Ambulatory Visit: Payer: Self-pay

## 2021-05-22 ENCOUNTER — Other Ambulatory Visit: Payer: Self-pay

## 2021-05-22 ENCOUNTER — Emergency Department: Payer: Self-pay

## 2021-05-22 ENCOUNTER — Emergency Department
Admission: EM | Admit: 2021-05-22 | Discharge: 2021-05-22 | Disposition: A | Discharge status: Home / Self Care | Payer: Self-pay | Attending: Emergency Medicine | Admitting: Emergency Medicine

## 2021-05-22 ENCOUNTER — Encounter: Payer: Self-pay | Admitting: Intensive Care

## 2021-05-22 DIAGNOSIS — E871 Hypo-osmolality and hyponatremia: Secondary | ICD-10-CM | POA: Insufficient documentation

## 2021-05-22 DIAGNOSIS — Z20822 Contact with and (suspected) exposure to covid-19: Secondary | ICD-10-CM | POA: Insufficient documentation

## 2021-05-22 DIAGNOSIS — Z8616 Personal history of COVID-19: Secondary | ICD-10-CM | POA: Insufficient documentation

## 2021-05-22 DIAGNOSIS — J45901 Unspecified asthma with (acute) exacerbation: Secondary | ICD-10-CM

## 2021-05-22 LAB — CBC WITH DIFFERENTIAL/PLATELET
Abs Immature Granulocytes: 0.01 10*3/uL (ref 0.00–0.07)
Basophils Absolute: 0 10*3/uL (ref 0.0–0.1)
Basophils Relative: 1 %
Eosinophils Absolute: 0.3 10*3/uL (ref 0.0–0.5)
Eosinophils Relative: 6 %
HCT: 40.2 % (ref 39.0–52.0)
Hemoglobin: 12.9 g/dL — ABNORMAL LOW (ref 13.0–17.0)
Immature Granulocytes: 0 %
Lymphocytes Relative: 22 %
Lymphs Abs: 1.2 10*3/uL (ref 0.7–4.0)
MCH: 26.6 pg (ref 26.0–34.0)
MCHC: 32.1 g/dL (ref 30.0–36.0)
MCV: 82.9 fL (ref 80.0–100.0)
Monocytes Absolute: 0.5 10*3/uL (ref 0.1–1.0)
Monocytes Relative: 10 %
Neutro Abs: 3.4 10*3/uL (ref 1.7–7.7)
Neutrophils Relative %: 61 %
Platelets: 380 10*3/uL (ref 150–400)
RBC: 4.85 MIL/uL (ref 4.22–5.81)
RDW: 14.1 % (ref 11.5–15.5)
WBC: 5.5 10*3/uL (ref 4.0–10.5)
nRBC: 0 % (ref 0.0–0.2)

## 2021-05-22 LAB — COMPREHENSIVE METABOLIC PANEL
ALT: 27 U/L (ref 0–44)
AST: 30 U/L (ref 15–41)
Albumin: 4.2 g/dL (ref 3.5–5.0)
Alkaline Phosphatase: 58 U/L (ref 38–126)
Anion gap: 12 (ref 5–15)
BUN: 11 mg/dL (ref 6–20)
CO2: 25 mmol/L (ref 22–32)
Calcium: 9.4 mg/dL (ref 8.9–10.3)
Chloride: 97 mmol/L — ABNORMAL LOW (ref 98–111)
Creatinine, Ser: 0.77 mg/dL (ref 0.61–1.24)
GFR, Estimated: >60 mL/min (ref >60)
Glucose, Bld: 90 mg/dL (ref 70–99)
Potassium: 4.3 mmol/L (ref 3.5–5.1)
Sodium: 134 mmol/L — ABNORMAL LOW (ref 135–145)
Total Bilirubin: 0.8 mg/dL (ref 0.3–1.2)
Total Protein: 8 g/dL (ref 6.5–8.1)

## 2021-05-22 LAB — RESP PANEL BY RT-PCR (FLU A&B, COVID) ARPGX2
Influenza A by PCR: NEGATIVE
Influenza B by PCR: NEGATIVE
SARS Coronavirus 2 by RT PCR: NEGATIVE

## 2021-05-22 LAB — TROPONIN I (HIGH SENSITIVITY): Troponin I (High Sensitivity): 4 ng/L (ref <18)

## 2021-05-22 MED ORDER — IPRATROPIUM-ALBUTEROL 0.5-2.5 (3) MG/3ML IN SOLN
3.0000 mL | Freq: Once | RESPIRATORY_TRACT | Status: AC
  Administered 2021-05-22: 3 mL via RESPIRATORY_TRACT
  Filled 2021-05-22: qty 3

## 2021-05-22 MED ORDER — PREDNISONE 10 MG PO TABS: ORAL_TABLET | ORAL | 0 refills | Status: DC

## 2021-05-22 MED ORDER — PREDNISONE 20 MG PO TABS
60.0000 mg | ORAL_TABLET | Freq: Once | ORAL | Status: AC
Start: 2021-05-22 — End: 2021-05-22
  Administered 2021-05-22: 60 mg via ORAL
  Filled 2021-05-22: qty 3

## 2021-05-22 MED ORDER — ALBUTEROL SULFATE HFA 108 (90 BASE) MCG/ACT IN AERS: 2.0000 | INHALATION_SPRAY | Freq: Four times a day (QID) | RESPIRATORY_TRACT | 2 refills | Status: DC | PRN

## 2021-05-22 NOTE — ED Provider Notes (Signed)
[facility] Provider Note    Event Date/Time   First MD Initiated Contact with Patient 05/22/21 [phone]     (approximate)   History   Asthma   HPI  Justin Reid is a 41 y.o. male   presents to the ED with complaint of asthma flareup.  Patient states that he used his inhaler last evening but did not improve.  He has in the past taken prednisone.  He is not completely sure how much of his inhaler he still has.  Patient has a history of asthma, pneumonia due to COVID past history, acute respiratory failure with hypoxia, hypokalemia, and hyponatremia.      Physical Exam   Triage Vital Signs: ED Triage Vitals  Enc Vitals Group     BP 05/22/21 0932 (!) 158/103     Pulse Rate 05/22/21 0932 87     Resp 05/22/21 0932 16     Temp 05/22/21 0932 98.2 F (36.8 C)     Temp Source 05/22/21 0932 Oral     SpO2 05/22/21 0932 96 %     Weight 05/22/21 0927 300 lb (136.1 kg)     Height 05/22/21 0927 5\' 11"  (1.803 m)     Head Circumference --      Peak Flow --      Pain Score 05/22/21 0927 8     Pain Loc --      Pain Edu? --      Excl. in [place]? --     Most recent vital signs: Vitals:   05/22/21 1423 05/22/21 1439  BP:  (!) 156/102  Pulse: 87   Resp: 19   Temp:    SpO2: 96%      General: Awake, no distress.  Able to talk in complete sentences without any difficulty. CV:  Good peripheral perfusion.  Heart regular rate and rhythm. Resp:  Normal effort.  Moderate amount of expiratory wheezes are noted throughout.  No rales or rhonchi are appreciated by auscultation. Abd:  No distention.  Other:  Patient is able to ambulate without any assistance.   ED Results / Procedures / Treatments   Labs (all labs ordered are listed, but only abnormal results are displayed) Labs Reviewed  CBC WITH DIFFERENTIAL/PLATELET - Abnormal; Notable for the following components:      Result Value   Hemoglobin 12.9 (*)    All other components within normal limits   COMPREHENSIVE METABOLIC PANEL - Abnormal; Notable for the following components:   Sodium 134 (*)    Chloride 97 (*)    All other components within normal limits  RESP PANEL BY RT-PCR (FLU A&B, COVID) ARPGX2  TROPONIN I (HIGH SENSITIVITY)     EKG  Normal sinus rhythm Vent. rate 82 BPM PR interval 196 ms QRS duration 102 ms QT/QTcB 396/462 ms P-R-T axes 59 -4 20   RADIOLOGY  Chest x-ray interpreted by myself with no evidence of pneumonia.  Radiology reports no focal infiltrate or pleural effusion.   PROCEDURES:  Critical Care performed:   Procedures   MEDICATIONS ORDERED IN ED: Medications  predniSONE (DELTASONE) tablet 60 mg (60 mg Oral Given 05/22/21 0939)  ipratropium-albuterol (DUONEB) 0.5-2.5 (3) MG/3ML nebulizer solution 3 mL (3 mLs Nebulization Given 05/22/21 0939)  ipratropium-albuterol (DUONEB) 0.5-2.5 (3) MG/3ML nebulizer solution 3 mL (3 mLs Nebulization Given 05/22/21 1057)  ipratropium-albuterol (DUONEB) 0.5-2.5 (3) MG/3ML nebulizer solution 3 mL (3 mLs Nebulization Given 05/22/21 1248)     IMPRESSION / MDM / ASSESSMENT  AND PLAN / ED COURSE  I reviewed the triage vital signs and the nursing notes.   Differential diagnosis includes, but is not limited to, acute exacerbation of chronic asthma, pneumonia, influenza, COVID.  41 year old male presents to the ED with onset of asthma that mildly started last evening.  He used his inhaler but states that it was worse this morning.  Chest x-ray is negative for pneumonia.  Respiratory panel was negative for COVID and influenza.  Troponin was 4, CBC was essentially normal with a WBC of 5.5.  CMP showed sodium slightly low at 134.  Patient was given prednisone 60 mg p.o. while in the ED along with 3 DuoNeb treatments.  Dr. Fuller Plan was also in to see the patient.  Patient continued to improve and states that he feels better.  After ambulating his O2 sat remained around 96%.  Patient is improving.  A prescription for continued  prednisone was sent to the pharmacy for him to continue with this and also a prescription for an albuterol inhaler so that he has an extra 1 in case he runs out.  He is to follow-up with his PCP and also return to the emergency department if any severe worsening of his symptoms.        FINAL CLINICAL IMPRESSION(S) / ED DIAGNOSES   Final diagnoses:  Moderate asthma with exacerbation, unspecified whether persistent     Rx / DC Orders   ED Discharge Orders          Ordered    predniSONE (DELTASONE) 10 MG tablet        05/22/21 1428    albuterol (VENTOLIN HFA) 108 (90 Base) MCG/ACT inhaler  Every 6 hours PRN        05/22/21 1428             Note:  This document was prepared using Dragon voice recognition software and may include unintentional dictation errors.   Tommi Rumps, PA-C 05/22/21 1503    Concha Se, MD 05/22/21 [phone]

## 2021-05-22 NOTE — ED Notes (Signed)
Pt came out to the first nurse desk from his exam room and asked if we could send some kind of information to the courthouse to notify them that he was been seen and treated currently in the ER so that he doesn't get a failure to appear in traffic court. An excuse was faxed to the number the patient gave this RN and the fax machine showed that it went through

## 2021-05-22 NOTE — ED Notes (Signed)
Pt up to restroom.

## 2021-05-22 NOTE — Discharge Instructions (Signed)
Follow-up with your primary care provider for your asthma.  Continue using your albuterol inhaler 2 puffs every 4-6 hours as needed for shortness of breath, cough or wheezing.  A prescription for prednisone was sent to the pharmacy for you to begin taking.  Increase fluids to stay hydrated.

## 2021-05-22 NOTE — ED Triage Notes (Signed)
Patient c/o asthma flare up. Audible wheezing noted. Some relief with inhalers and nebulizers

## 2021-06-22 ENCOUNTER — Encounter: Payer: Self-pay | Admitting: *Deleted

## 2021-06-22 ENCOUNTER — Other Ambulatory Visit: Payer: Self-pay

## 2021-06-22 ENCOUNTER — Emergency Department
Admission: EM | Admit: 2021-06-22 | Discharge: 2021-06-22 | Disposition: A | Discharge status: Home / Self Care | Payer: Self-pay | Attending: Emergency Medicine | Admitting: Emergency Medicine

## 2021-06-22 DIAGNOSIS — M25561 Pain in right knee: Secondary | ICD-10-CM | POA: Insufficient documentation

## 2021-06-22 DIAGNOSIS — M25562 Pain in left knee: Secondary | ICD-10-CM | POA: Insufficient documentation

## 2021-06-22 MED ORDER — MELOXICAM 15 MG PO TABS: 15.0000 mg | ORAL_TABLET | Freq: Every day | ORAL | 2 refills | Status: DC

## 2021-06-22 NOTE — ED Provider Notes (Signed)
[facility] Provider Note    Event Date/Time   First MD Initiated Contact with Patient 06/22/21 1642     (approximate)   History   Knee Pain   HPI Justin Reid is a 41 y.o. male presents emergency department complaining of bilateral knee pain.  States both knees hurt towards the medial aspect.  No numbness or tingling.  History of the same.  No known injury.  States with weather changes it gets worse.     Physical Exam   Triage Vital Signs: ED Triage Vitals  Enc Vitals Group     BP 06/22/21 1601 (!) 136/107     Pulse Rate 06/22/21 1559 85     Resp 06/22/21 1559 20     Temp 06/22/21 1559 98.9 F (37.2 C)     Temp Source 06/22/21 1559 Oral     SpO2 06/22/21 1559 95 %     Weight 06/22/21 1559 257 lb (116.6 kg)     Height 06/22/21 1559 6' (1.829 m)     Head Circumference --      Peak Flow --      Pain Score 06/22/21 1559 10     Pain Loc --      Pain Edu? --      Excl. in [place]? --     Most recent vital signs: Vitals:   06/22/21 1559 06/22/21 1601  BP:  (!) 136/107  Pulse: 85   Resp: 20   Temp: 98.9 F (37.2 C)   SpO2: 95%      General: Awake, no distress.   CV:  Good peripheral perfusion. regular rate and  rhythm Resp:  Normal effort. Lungs  Abd:  No distention.   Other:   patient with bilateral knee tenderness, crepitus noted on extension, neurovascular intact,   ED Results / Procedures / Treatments   Labs (all labs ordered are listed, but only abnormal results are displayed) Labs Reviewed - No data to display   EKG    RADIOLOGY     PROCEDURES:   Procedures   MEDICATIONS ORDERED IN ED: Medications - No data to display   IMPRESSION / MDM / ASSESSMENT AND PLAN / ED COURSE  I reviewed the triage vital signs and the nursing notes.                              Differential diagnosis includes, but is not limited to, arthritis, chronic knee pain, sprain  Patient's presentation is most consistent with acute,  uncomplicated illness.  This is a chronic problem with patient.  Did discuss his case with him.  We both agreed and shared decision making not to do x-rays today.  He was given a prescription for meloxicam.  He is to follow-up with orthopedics.  Patient is in agreement treatment plan.  Discharged stable condition with referral to orthopedics and a work note.  Given a prescription for meloxicam.      FINAL CLINICAL IMPRESSION(S) / ED DIAGNOSES   Final diagnoses:  Pain in both knees, unspecified chronicity     Rx / DC Orders   ED Discharge Orders          Ordered    meloxicam (MOBIC) 15 MG tablet  Daily        06/22/21 1645             Note:  This document was prepared using Dragon voice recognition software and  may include unintentional dictation errors.    Faythe Ghee, PA-C 06/22/21 1650    Minna Antis, MD 06/22/21 2010

## 2021-06-22 NOTE — ED Triage Notes (Signed)
Pt reports bilateral knee pain.  No known injury.  Sx for 1 month.  States when weather changes, pain worse.  Pt ambulating with a limp.  Pt alert  speech clear.

## 2021-07-15 ENCOUNTER — Other Ambulatory Visit: Payer: Self-pay

## 2021-07-15 ENCOUNTER — Emergency Department
Admission: EM | Admit: 2021-07-15 | Discharge: 2021-07-15 | Disposition: A | Discharge status: Home / Self Care | Payer: Self-pay | Attending: Emergency Medicine | Admitting: Emergency Medicine

## 2021-07-15 DIAGNOSIS — H1089 Other conjunctivitis: Secondary | ICD-10-CM | POA: Insufficient documentation

## 2021-07-15 DIAGNOSIS — H109 Unspecified conjunctivitis: Secondary | ICD-10-CM

## 2021-07-15 DIAGNOSIS — Y990 Civilian activity done for income or pay: Secondary | ICD-10-CM | POA: Insufficient documentation

## 2021-07-15 MED ORDER — TOBRAMYCIN 0.3 % OP SOLN: 2.0000 [drp] | OPHTHALMIC | 0 refills | Status: DC

## 2021-07-15 NOTE — ED Triage Notes (Signed)
Pt here with right eye pain that started 3 days ago. Pt was in contact with someone that has pink eye.

## 2021-07-15 NOTE — ED Provider Notes (Signed)
   [facility] Provider Note    Event Date/Time   First MD Initiated Contact with Patient 07/15/21 1312     (approximate)   History   Eye Drainage   HPI  Justin Reid is a 41 y.o. male   presents to the ED with complaint of right eye redness and drainage from his eye that started approximately 3 days ago.  Patient states that he was in contact with someone who had pinkeye at work.  He denies any visual changes, upper respiratory symptoms or fever or chills.  He reports that his lashes are matted shut in the mornings.      Physical Exam   Triage Vital Signs: ED Triage Vitals [07/15/21 1229]  Enc Vitals Group     BP      Pulse Rate 80     Resp 16     Temp 98.5 F (36.9 C)     Temp Source Oral     SpO2 97 %     Weight 256 lb (116.1 kg)     Height 6' (1.829 m)     Head Circumference      Peak Flow      Pain Score 7     Pain Loc      Pain Edu?      Excl. in [place]?     Most recent vital signs: Vitals:   07/15/21 1229  Pulse: 80  Resp: 16  Temp: 98.5 F (36.9 C)  SpO2: 97%     General: Awake, no distress.  CV:  Good peripheral perfusion.  Resp:  Normal effort.  Abd:  No distention.  Other:  Right conjunctive is injected and there is a yellow exudate present.  PERRLA, EOMI's.     ED Results / Procedures / Treatments   Labs (all labs ordered are listed, but only abnormal results are displayed) Labs Reviewed - No data to display     PROCEDURES:  Critical Care performed:   Procedures   MEDICATIONS ORDERED IN ED: Medications - No data to display   IMPRESSION / MDM / ASSESSMENT AND PLAN / ED COURSE  I reviewed the triage vital signs and the nursing notes.   Differential diagnosis includes, but is not limited to, viral conjunctivitis, bacterial conjunctivitis, chemical conjunctivitis.   41 year old male presents to the ED with right eye injection and yellow exudate.  Patient has been in contact with someone who had  pinkeye.  A prescription for Tobrex ophthalmic solution was sent to his pharmacy and he is aware that he needs to wash his hands immediately after touching his eye and that this is contagious.  He is instructed to follow-up with [facility] if any worsening of his symptoms or not improving.     Patient's presentation is most consistent with acute, uncomplicated illness.  FINAL CLINICAL IMPRESSION(S) / ED DIAGNOSES   Final diagnoses:  Bacterial conjunctivitis of right eye     Rx / DC Orders   ED Discharge Orders          Ordered    tobramycin (TOBREX) 0.3 % ophthalmic solution  Every 4 hours        07/15/21 1407             Note:  This document was prepared using Dragon voice recognition software and may include unintentional dictation errors.   Tommi Rumps, PA-C 07/15/21 1411    Georga Hacking, MD 07/15/21 [phone]

## 2021-07-15 NOTE — ED Notes (Signed)
Pt to ED for R eye irritation since 3 days. Recent pink eye exposure.

## 2021-07-15 NOTE — Discharge Instructions (Addendum)
Follow-up with [facility] if any continued problems or not improving.  Use the eyedrops every 4 hours to your right eye and also to your left should your left eye began draining.  Make sure that you wash your hands immediately after putting the eyedrops and to avoid contamination.  Currently you are contagious and can transmit it to someone else.

## 2021-08-04 ENCOUNTER — Other Ambulatory Visit: Payer: Self-pay

## 2021-08-04 ENCOUNTER — Emergency Department
Admission: EM | Admit: 2021-08-04 | Discharge: 2021-08-04 | Disposition: A | Discharge status: Home / Self Care | Payer: Self-pay | Attending: Emergency Medicine | Admitting: Emergency Medicine

## 2021-08-04 ENCOUNTER — Encounter: Payer: Self-pay | Admitting: Emergency Medicine

## 2021-08-04 ENCOUNTER — Emergency Department: Payer: Self-pay

## 2021-08-04 DIAGNOSIS — Y990 Civilian activity done for income or pay: Secondary | ICD-10-CM | POA: Insufficient documentation

## 2021-08-04 DIAGNOSIS — J45909 Unspecified asthma, uncomplicated: Secondary | ICD-10-CM | POA: Insufficient documentation

## 2021-08-04 DIAGNOSIS — X503XXA Overexertion from repetitive movements, initial encounter: Secondary | ICD-10-CM | POA: Insufficient documentation

## 2021-08-04 DIAGNOSIS — S39012A Strain of muscle, fascia and tendon of lower back, initial encounter: Secondary | ICD-10-CM | POA: Insufficient documentation

## 2021-08-04 MED ORDER — LIDOCAINE 5 % EX PTCH
2.0000 | MEDICATED_PATCH | CUTANEOUS | Status: DC
Start: 2021-08-04 — End: 2021-08-04
  Administered 2021-08-04: 2 via TRANSDERMAL
  Filled 2021-08-04: qty 2

## 2021-08-04 MED ORDER — KETOROLAC TROMETHAMINE 15 MG/ML IJ SOLN
15.0000 mg | Freq: Once | INTRAMUSCULAR | Status: AC
Start: 2021-08-04 — End: 2021-08-04
  Administered 2021-08-04: 15 mg via INTRAMUSCULAR
  Filled 2021-08-04: qty 1

## 2021-08-04 MED ORDER — NAPROXEN 500 MG PO TABS: 500.0000 mg | ORAL_TABLET | Freq: Two times a day (BID) | ORAL | 0 refills | Status: DC

## 2021-08-04 MED ORDER — LIDOCAINE 5 % EX PTCH: 1.0000 | MEDICATED_PATCH | Freq: Two times a day (BID) | CUTANEOUS | 0 refills | Status: DC

## 2021-08-04 NOTE — ED Provider Notes (Signed)
[facility] Provider Note    Event Date/Time   First MD Initiated Contact with Patient 08/04/21 [phone]     (approximate)   History   Chief Complaint: Back Pain   HPI  Justin Reid is a 41 y.o. male with a past history of asthma who comes ED complaining of right lower back pain as well as right upper back pain which are not connected.  Worse with movement.  Been present for the past 3 months.  Feels worse when he is working which involves repetitive lifting of boxes.  No chest pain or shortness of breath, no dizziness no paresthesias or motor weakness.  No hematuria, no bowel or bladder incontinence or retention.     Physical Exam   Triage Vital Signs: ED Triage Vitals  Enc Vitals Group     BP 08/04/21 0857 (!) 155/112     Pulse Rate 08/04/21 0856 76     Resp 08/04/21 0856 16     Temp 08/04/21 0856 98.6 F (37 C)     Temp Source 08/04/21 0856 Oral     SpO2 08/04/21 0856 97 %     Weight 08/04/21 0856 270 lb (122.5 kg)     Height 08/04/21 0856 5\' 10"  (1.778 m)     Head Circumference --      Peak Flow --      Pain Score 08/04/21 0855 10     Pain Loc --      Pain Edu? --      Excl. in GC? --     Most recent vital signs: Vitals:   08/04/21 0856 08/04/21 0857  BP:  (!) 155/112  Pulse: 76   Resp: 16   Temp: 98.6 F (37 C)   SpO2: 97%     General: Awake, no distress.  CV:  Good peripheral perfusion.  Normal peripheral pulses Resp:  Normal effort.  Clear to auscultation bilaterally Abd:  No distention.  Soft and nontender Other:  No midline spinal tenderness.  Moving all extremities with normal strength and coordination.  There is muscular tenseness and tenderness in the right lumbar back which reproduces his pain.  There is also tenseness and tenderness in the right rhomboids which reproduces pain.   ED Results / Procedures / Treatments   Labs (all labs ordered are listed, but only abnormal results are displayed) Labs Reviewed - No data  to display   EKG    RADIOLOGY X-ray of lumbar spine interpreted by me, no fracture or mass or visible kidney stone.  Radiology report reviewed.   PROCEDURES:  Procedures   MEDICATIONS ORDERED IN ED: Medications  lidocaine (LIDODERM) 5 % 2 patch (2 patches Transdermal Patch Applied 08/04/21 0957)  ketorolac (TORADOL) 15 MG/ML injection 15 mg (15 mg Intramuscular Given 08/04/21 0957)     IMPRESSION / MDM / ASSESSMENT AND PLAN / ED COURSE  I reviewed the triage vital signs and the nursing notes.                              Differential diagnosis includes, but is not limited to, muscle strain, spinal fracture, spinal mass.  Doubt infection, epidural abscess, hematoma, or central cord syndrome.  Patient's presentation is most consistent with acute complicated illness / injury requiring diagnostic workup.  Patient presents with low back pain which clinically appears to be muscular.  However with prolonged duration, x-ray needed which is negative.  He  is stable for discharge to continue NSAIDs and supportive care.  Work note provided.       FINAL CLINICAL IMPRESSION(S) / ED DIAGNOSES   Final diagnoses:  Strain of lumbar region, initial encounter     Rx / DC Orders   ED Discharge Orders          Ordered    naproxen (NAPROSYN) 500 MG tablet  2 times daily with meals        08/04/21 1028    lidocaine (LIDODERM) 5 %  Every 12 hours        08/04/21 1028             Note:  This document was prepared using Dragon voice recognition software and may include unintentional dictation errors.   Sharman Cheek, MD 08/04/21 1041

## 2021-08-04 NOTE — Discharge Instructions (Addendum)
Your x-ray of the back looks normal today.

## 2021-08-04 NOTE — ED Triage Notes (Signed)
Back pain right lower and upper.  Says no injury, but he does a lot of heavy lifting.

## 2021-09-05 ENCOUNTER — Emergency Department: Payer: Self-pay

## 2021-09-05 ENCOUNTER — Emergency Department
Admission: EM | Admit: 2021-09-05 | Discharge: 2021-09-05 | Disposition: A | Discharge status: Home / Self Care | Payer: Self-pay | Attending: Emergency Medicine | Admitting: Emergency Medicine

## 2021-09-05 ENCOUNTER — Other Ambulatory Visit: Payer: Self-pay

## 2021-09-05 DIAGNOSIS — J45901 Unspecified asthma with (acute) exacerbation: Secondary | ICD-10-CM | POA: Insufficient documentation

## 2021-09-05 DIAGNOSIS — R0789 Other chest pain: Secondary | ICD-10-CM

## 2021-09-05 LAB — BASIC METABOLIC PANEL
Anion gap: 10 (ref 5–15)
BUN: 15 mg/dL (ref 6–20)
CO2: 24 mmol/L (ref 22–32)
Calcium: 8.8 mg/dL — ABNORMAL LOW (ref 8.9–10.3)
Chloride: 103 mmol/L (ref 98–111)
Creatinine, Ser: 0.89 mg/dL (ref 0.61–1.24)
GFR, Estimated: >60 mL/min (ref >60)
Glucose, Bld: 102 mg/dL — ABNORMAL HIGH (ref 70–99)
Potassium: 3.7 mmol/L (ref 3.5–5.1)
Sodium: 137 mmol/L (ref 135–145)

## 2021-09-05 LAB — CBC
HCT: 41 % (ref 39.0–52.0)
Hemoglobin: 13 g/dL (ref 13.0–17.0)
MCH: 27.3 pg (ref 26.0–34.0)
MCHC: 31.7 g/dL (ref 30.0–36.0)
MCV: 86.1 fL (ref 80.0–100.0)
Platelets: 409 10*3/uL — ABNORMAL HIGH (ref 150–400)
RBC: 4.76 MIL/uL (ref 4.22–5.81)
RDW: 14.4 % (ref 11.5–15.5)
WBC: 8.6 10*3/uL (ref 4.0–10.5)
nRBC: 0 % (ref 0.0–0.2)

## 2021-09-05 LAB — TROPONIN I (HIGH SENSITIVITY)
Troponin I (High Sensitivity): 5 ng/L (ref <18)
Troponin I (High Sensitivity): 4 ng/L (ref <18)

## 2021-09-05 MED ORDER — IBUPROFEN 600 MG PO TABS: 600.0000 mg | ORAL_TABLET | Freq: Four times a day (QID) | ORAL | 0 refills | Status: DC | PRN

## 2021-09-05 MED ORDER — KETOROLAC TROMETHAMINE 30 MG/ML IJ SOLN
30.0000 mg | Freq: Once | INTRAMUSCULAR | Status: AC
Start: 2021-09-05 — End: 2021-09-05
  Administered 2021-09-05: 30 mg via INTRAMUSCULAR
  Filled 2021-09-05: qty 1

## 2021-09-05 MED ORDER — ALBUTEROL SULFATE (2.5 MG/3ML) 0.083% IN NEBU
2.5000 mg | INHALATION_SOLUTION | Freq: Once | RESPIRATORY_TRACT | Status: AC
  Administered 2021-09-05: 2.5 mg via RESPIRATORY_TRACT
  Filled 2021-09-05: qty 3

## 2021-09-05 MED ORDER — ALBUTEROL SULFATE HFA 108 (90 BASE) MCG/ACT IN AERS: 2.0000 | INHALATION_SPRAY | RESPIRATORY_TRACT | 0 refills | Status: DC | PRN

## 2021-09-05 MED ORDER — PREDNISONE 20 MG PO TABS: 40.0000 mg | ORAL_TABLET | Freq: Every day | ORAL | 0 refills | Status: AC

## 2021-09-05 NOTE — Discharge Instructions (Signed)
Return to the ER for new, worsening, or persistent severe chest pain, difficulty breathing, weakness or lightheadedness, or any other new or worsening symptoms that concern you.  Take ibuprofen up to every 6 hours as long as you have something in your stomach.  Take the prednisone daily for the next 5 days.

## 2021-09-05 NOTE — ED Provider Notes (Signed)
[facility] Provider Note    Event Date/Time   First MD Initiated Contact with Patient 09/05/21 1034     (approximate)   History   Chest Pain   HPI  Justin Reid is a 41 y.o. male with history of asthma but no other active medical problems who presents with right-sided chest pain over the last 2 days, gradual onset, worse with certain movements or positions, or with coughing or taking deep breath.  He reports mild shortness of breath consistent with prior asthma flareups.  He denies fever, nausea or vomiting, or any lightheadedness.  He denies prior history of this pain.  He has no trauma to the area.  He has no leg swelling.  He has not taken anything for the pain at home.    Physical Exam   Triage Vital Signs: ED Triage Vitals  Enc Vitals Group     BP 09/05/21 0944 (!) 147/100     Pulse Rate 09/05/21 0944 70     Resp 09/05/21 0944 20     Temp 09/05/21 0944 98.3 F (36.8 C)     Temp src --      SpO2 09/05/21 0944 95 %     Weight --      Height --      Head Circumference --      Peak Flow --      Pain Score 09/05/21 0941 10     Pain Loc --      Pain Edu? --      Excl. in GC? --     Most recent vital signs: Vitals:   09/05/21 1100 09/05/21 1130  BP: (!) 139/90 (!) 144/104  Pulse: 69 64  Resp: 16 13  Temp:    SpO2: 95%      General: And oriented, well-appearing. CV:  Good peripheral perfusion.  Normal heart sounds. Resp:  Normal effort.  Lungs CTAB with slightly prolonged expiratory phase. Abd:  No distention.  Other:  No peripheral edema.  Right chest wall with somewhat reproducible pain to palpation.   ED Results / Procedures / Treatments   Labs (all labs ordered are listed, but only abnormal results are displayed) Labs Reviewed  BASIC METABOLIC PANEL - Abnormal; Notable for the following components:      Result Value   Glucose, Bld 102 (*)    Calcium 8.8 (*)    All other components within normal limits  CBC - Abnormal;  Notable for the following components:   Platelets 409 (*)    All other components within normal limits  TROPONIN I (HIGH SENSITIVITY)  TROPONIN I (HIGH SENSITIVITY)     EKG  ED ECG REPORT I, Dionne Bucy, the attending physician, personally viewed and interpreted this ECG.  Date: 09/05/2021 EKG Time: 0943 Rate: 76 Rhythm: normal sinus rhythm QRS Axis: normal Intervals: normal ST/T Wave abnormalities: normal Narrative Interpretation: no evidence of acute ischemia    RADIOLOGY  Chest x-ray: I independently viewed and interpreted the images; there is no focal consolidation or edema  PROCEDURES: No Critical Care performed: No  Procedures   MEDICATIONS ORDERED IN ED: Medications  ketorolac (TORADOL) 30 MG/ML injection 30 mg (30 mg Intramuscular Given 09/05/21 1135)  albuterol (PROVENTIL) (2.5 MG/3ML) 0.083% nebulizer solution 2.5 mg (2.5 mg Nebulization Given 09/05/21 1135)     IMPRESSION / MDM / ASSESSMENT AND PLAN / ED COURSE  I reviewed the triage vital signs and the nursing notes.  41 year old male with no  cardiac history or risk factors presents with atypical right chest pain over the last 2 days.  On exam the patient is well-appearing.  His vital signs are normal.  Physical exam is overall unremarkable except for somewhat reproducible right-sided pain.  EKG is nonischemic.  Chest x-ray shows no acute abnormality.  Differential diagnosis includes, but is not limited to, musculoskeletal chest wall pain, radiculopathy, asthma exacerbation, less likely ACS.  Given the somewhat reproducible nature of the pain and the lack of tachycardia or hypoxia there is no evidence of PE or vascular etiology.  The patient is PERC negative.  Patient's presentation is most consistent with acute presentation with potential threat to life or bodily function.  Basic labs are unremarkable.  Initial troponin is negative.  We will give Toradol and albuterol for symptomatic treatment,  obtain a repeat troponin, and reassess.  The patient is on the cardiac monitor to evaluate for evidence of arrhythmia and/or significant heart rate changes.  ----------------------------------------- 1:14 PM on 09/05/2021 -----------------------------------------  Repeat troponin is negative.  The patient reports significant improvement in his symptoms after Toradol and albuterol.  He is stable for discharge home.  I counseled him on the results of the work-up.  I will prescribe ibuprofen as well as treatment for possible asthma exacerbation.  Return precautions given, and he expresses  understanding.   FINAL CLINICAL IMPRESSION(S) / ED DIAGNOSES   Final diagnoses:  Atypical chest pain  Asthma with acute exacerbation, unspecified asthma severity, unspecified whether persistent     Rx / DC Orders   ED Discharge Orders     None        Note:  This document was prepared using Dragon voice recognition software and may include unintentional dictation errors.    Dionne Bucy, MD 09/05/21 1315

## 2021-09-05 NOTE — ED Triage Notes (Addendum)
Pt presents to the ED with c/o of R side CP/lateral side. Pt states started 2 days ago.

## 2021-10-29 ENCOUNTER — Other Ambulatory Visit: Payer: Self-pay

## 2021-10-29 ENCOUNTER — Encounter: Payer: Self-pay | Admitting: Emergency Medicine

## 2021-10-29 ENCOUNTER — Emergency Department
Admission: EM | Admit: 2021-10-29 | Discharge: 2021-10-29 | Disposition: A | Discharge status: Home / Self Care | Payer: Self-pay | Attending: Emergency Medicine | Admitting: Emergency Medicine

## 2021-10-29 DIAGNOSIS — M25561 Pain in right knee: Secondary | ICD-10-CM | POA: Insufficient documentation

## 2021-10-29 DIAGNOSIS — M25461 Effusion, right knee: Secondary | ICD-10-CM | POA: Insufficient documentation

## 2021-10-29 DIAGNOSIS — G8929 Other chronic pain: Secondary | ICD-10-CM | POA: Insufficient documentation

## 2021-10-29 DIAGNOSIS — J45909 Unspecified asthma, uncomplicated: Secondary | ICD-10-CM | POA: Insufficient documentation

## 2021-10-29 MED ORDER — NAPROXEN 500 MG PO TABS: 500.0000 mg | ORAL_TABLET | Freq: Two times a day (BID) | ORAL | 0 refills | Status: AC

## 2021-10-29 NOTE — ED Provider Notes (Signed)
[facility] Provider Note    Event Date/Time   First MD Initiated Contact with Patient 10/29/21 1459     (approximate)   History   Chief Complaint Knee Pain   HPI Justin Reid is a 41 y.o. male, history of asthma, obesity, presents emergency department for evaluation of knee pain.  Patient states he is experiencing right-sided knee pain since last night.  Denies any recent injury.  He states that he has had pain like this in the past and was told that it was arthritis.  Denies fever/chills, thigh pain, lower leg pain, cold sensation, numbness/tingling, rash/lesions, chest pain, shortness of breath, or abdominal pain.  History Limitations: No limitations.        Physical Exam  Triage Vital Signs: ED Triage Vitals  Enc Vitals Group     BP 10/29/21 1450 (!) 131/113     Pulse Rate 10/29/21 1450 94     Resp 10/29/21 1450 18     Temp 10/29/21 1450 98.4 F (36.9 C)     Temp Source 10/29/21 1450 Oral     SpO2 10/29/21 1450 96 %     Weight 10/29/21 1457 270 lb 1 oz (122.5 kg)     Height 10/29/21 1457 5\' 10"  (1.778 m)     Head Circumference --      Peak Flow --      Pain Score --      Pain Loc --      Pain Edu? --      Excl. in [place]? --     Most recent vital signs: Vitals:   10/29/21 1450 10/29/21 1529  BP: (!) 131/113 (!) 128/99  Pulse: 94 88  Resp: 18 18  Temp: 98.4 F (36.9 C)   SpO2: 96% 97%    General: Awake, NAD.  Skin: Warm, dry. No rashes or lesions.  Eyes: PERRL. Conjunctivae normal.  CV: Good peripheral perfusion.  Resp: Normal effort.  Abd: Soft, non-tender. No distention.  Neuro: At baseline. No gross neurological deficits.  Musculoskeletal: Normal ROM of all extremities.  Focused Exam: No gross deformities to the right knee.  Mild diffuse swelling.  No warmth or erythema.  No significant tenderness along the patella, MCL, or PCL.  Negative anterior/posterior drawer.  No pain elicited with valgus/varus maneuvering.  Negative  lever test.  He is able to perform full flexion and extension of the knee joint.  He is able to ambulate well on his own without assistance.  Physical Exam    ED Results / Procedures / Treatments  Labs (all labs ordered are listed, but only abnormal results are displayed) Labs Reviewed - No data to display   EKG N/A.    RADIOLOGY  ED Provider Interpretation: N/A.  No results found.  PROCEDURES:  Critical Care performed: N/A.  Procedures    MEDICATIONS ORDERED IN ED: Medications - No data to display   IMPRESSION / MDM / [place] / ED COURSE  I reviewed the triage vital signs and the nursing notes.                              Differential diagnosis includes, but is not limited to, knee sprain, arthritis, joint effusion, gout, MCL/LCL sprain, ACL injury.  Assessment/Plan Patient presents with knee pain x1 day.  No history of gout.  Signs and symptoms not suggestive of any infectious pathology.  His previous knee x-ray on 05/08/2020  showed mild degenerative changes with moderate joint effusion.  He states that he has not had any acute injury since then.  I did offer x-ray imaging again today since sometime this past, however patient states that he does not feel that this is necessary.  He states that he only wants some medications to help him feel better.  We will provide him with a prescription for naproxen.  Provided him with a knee sleeve as well.  Provided with a referral to orthopedics as well to follow-up with if his pain fails to improve.  He was amenable to this plan.  Will discharge.  Provided the patient with anticipatory guidance, return precautions, and educational material. Encouraged the patient to return to the emergency department at any time if they begin to experience any new or worsening symptoms. Patient expressed understanding and agreed with the plan.   Patient's presentation is most consistent with acute, uncomplicated illness.        FINAL CLINICAL IMPRESSION(S) / ED DIAGNOSES   Final diagnoses:  Chronic pain of right knee     Rx / DC Orders   ED Discharge Orders          Ordered    naproxen (NAPROSYN) 500 MG tablet  2 times daily with meals        10/29/21 1512             Note:  This document was prepared using Dragon voice recognition software and may include unintentional dictation errors.   Varney Daily, [place] 10/29/21 2337    Phineas Semen, MD 10/30/21 [phone]

## 2021-10-29 NOTE — ED Triage Notes (Addendum)
Pt with right knee pain since last night. No known injury.  Has had pain like this in the past.  Better when not walking on it.  Pt has had x-ray in the past which showed arthritis.

## 2021-10-29 NOTE — Discharge Instructions (Addendum)
-  You may take naproxen as needed for pain/discomfort.  You may additionally take acetaminophen on top of that as needed.  You may use the knee brace as needed whenever walking.  Please review the educational material provided.  -Please follow-up with the orthopedist listed in these instructions.  Please call them to schedule appointment for ongoing care.  -Return to the emergency department anytime if you begin to experience any new or worsening symptoms.

## 2021-12-07 ENCOUNTER — Encounter: Payer: Self-pay | Admitting: Emergency Medicine

## 2021-12-07 ENCOUNTER — Emergency Department
Admission: EM | Admit: 2021-12-07 | Discharge: 2021-12-07 | Disposition: A | Discharge status: Home / Self Care | Payer: Self-pay | Attending: Emergency Medicine | Admitting: Emergency Medicine

## 2021-12-07 ENCOUNTER — Emergency Department: Payer: Self-pay

## 2021-12-07 ENCOUNTER — Other Ambulatory Visit: Payer: Self-pay

## 2021-12-07 DIAGNOSIS — Z8616 Personal history of COVID-19: Secondary | ICD-10-CM | POA: Insufficient documentation

## 2021-12-07 DIAGNOSIS — R0602 Shortness of breath: Secondary | ICD-10-CM | POA: Insufficient documentation

## 2021-12-07 DIAGNOSIS — I10 Essential (primary) hypertension: Secondary | ICD-10-CM | POA: Insufficient documentation

## 2021-12-07 DIAGNOSIS — J45909 Unspecified asthma, uncomplicated: Secondary | ICD-10-CM | POA: Insufficient documentation

## 2021-12-07 MED ORDER — PREDNISONE 50 MG PO TABS: ORAL_TABLET | ORAL | 0 refills | Status: DC

## 2021-12-07 MED ORDER — IPRATROPIUM-ALBUTEROL 0.5-2.5 (3) MG/3ML IN SOLN
3.0000 mL | Freq: Once | RESPIRATORY_TRACT | Status: AC
  Administered 2021-12-07: 3 mL via RESPIRATORY_TRACT
  Filled 2021-12-07: qty 3

## 2021-12-07 MED ORDER — PREDNISONE 20 MG PO TABS
60.0000 mg | ORAL_TABLET | Freq: Once | ORAL | Status: AC
  Administered 2021-12-07: 60 mg via ORAL
  Filled 2021-12-07: qty 3

## 2021-12-07 MED ORDER — FLUTICASONE FUROATE-VILANTEROL 200-25 MCG/ACT IN AEPB: 1.0000 | INHALATION_SPRAY | Freq: Every day | RESPIRATORY_TRACT | 0 refills | Status: DC

## 2021-12-07 NOTE — ED Triage Notes (Signed)
First Nurse Note:  Arrives c/o cough and SOB.  States symptoms started last night. Has history of Asthma, out of inhaler.  Used last puff last night.  AAOx3.  Skin warm and dry.  No SOB. DOE noted.  NAD

## 2021-12-07 NOTE — ED Provider Notes (Signed)
[facility] Provider Note    Event Date/Time   First MD Initiated Contact with Patient 12/07/21 0940     (approximate)   History   Shortness of Breath   HPI  Justin Reid is a 41 y.o. male past medical history of asthma hypertension who presents with shortness of breath.  Symptoms started last night.  Patient ran out of his albuterol inhaler last night.  Has not had cough fevers chills.  No chest pain no lower extremity swelling.The patient denies hx of prior DVT/PE, unilateral leg pain/swelling, hormone use, recent surgery, hx of cancer, prolonged immobilization, or hemoptysis.    Patient does not have a primary care doctor or pulmonologist.  Says he uses albuterol inhaler every 4 hours regularly.  Does not have a controller medication currently.     Past Medical History:  Diagnosis Date   Arthritis    Asthma    Hypertension     Patient Active Problem List   Diagnosis Date Noted   Hypokalemia due to excessive gastrointestinal loss of potassium 12/04/2020   SOB (shortness of breath) 11/11/2020   Shortness of breath 11/10/2020   At risk for sleep apnea 11/10/2020   Obesity hypoventilation syndrome (HCC) 11/10/2020   Truncal obesity 11/10/2020   2019-nCoV vaccination declined 11/10/2020   Acute respiratory failure with hypoxia (HCC)    Hyperkalemia    Pneumonia due to COVID-19 virus 10/07/2019   Hypokalemia    Hyponatremia    Acute midline thoracic back pain    Sepsis (HCC) 12/24/2017   Asthma 07/07/2016   Asthma exacerbation 02/20/2016     Physical Exam  Triage Vital Signs: ED Triage Vitals  Enc Vitals Group     BP 12/07/21 0836 (!) 161/110     Pulse Rate 12/07/21 0836 86     Resp 12/07/21 0836 18     Temp 12/07/21 0836 98.3 F (36.8 C)     Temp Source 12/07/21 0836 Oral     SpO2 12/07/21 0836 96 %     Weight 12/07/21 0837 250 lb (113.4 kg)     Height 12/07/21 0837 5\' 11"  (1.803 m)     Head Circumference --      Peak Flow --       Pain Score 12/07/21 0836 0     Pain Loc --      Pain Edu? --      Excl. in [place]? --     Most recent vital signs: Vitals:   12/07/21 0836  BP: (!) 161/110  Pulse: 86  Resp: 18  Temp: 98.3 F (36.8 C)  SpO2: 96%     General: Awake, no distress.  CV:  Good peripheral perfusion.  No peripheral edema Resp:  Normal effort.  Lungs are clear no wheezing Abd:  No distention.  Neuro:             Awake, Alert, Oriented x 3  Other:     ED Results / Procedures / Treatments  Labs (all labs ordered are listed, but only abnormal results are displayed) Labs Reviewed - No data to display   EKG     RADIOLOGY I reviewed and interpreted the CXR which does not show any acute cardiopulmonary process    PROCEDURES:  Critical Care performed: No  Procedures    MEDICATIONS ORDERED IN ED: Medications  predniSONE (DELTASONE) tablet 60 mg (60 mg Oral Given 12/07/21 0843)  ipratropium-albuterol (DUONEB) 0.5-2.5 (3) MG/3ML nebulizer solution 3 mL (3 mLs Nebulization  Given 12/07/21 0844)     IMPRESSION / MDM / ASSESSMENT AND PLAN / ED COURSE  I reviewed the triage vital signs and the nursing notes.                              Patient's presentation is most consistent with acute, uncomplicated illness.  Differential diagnosis includes, but is not limited to, asthma exacerbation, viral illness, pneumonia, less likely pulmonary embolism, CHF  Patient is a 41 year old male with history of asthma presents with shortness of breath.  Symptoms started last night after he ran out of his albuterol inhaler.  Is otherwise not been sick has no chest pain or lower extremity swelling.  On arrival he was given DuoNeb and a dose of prednisone.  When I assessed him he says he is feeling back to baseline he is not wheezing unclear if he was ever wheezing.  Given he is now asymptomatic I suspicion for PE or other more serious underlying process is low.  Chest x-ray is clear.  Patient tells me he is  using his albuterol inhaler every 4 hours and is not on a controller medication.  I see that he has been prescribed Breo Ellipta in the past I will refill this.  Prescribed 5 days of prednisone.  Patient recommended to follow-up with [facility] or [place] [facility].       FINAL CLINICAL IMPRESSION(S) / ED DIAGNOSES   Final diagnoses:  Shortness of breath     Rx / DC Orders   ED Discharge Orders          Ordered    predniSONE (DELTASONE) 50 MG tablet        12/07/21 1033    fluticasone furoate-vilanterol (BREO ELLIPTA) 200-25 MCG/ACT AEPB  Daily        12/07/21 1033             Note:  This document was prepared using Dragon voice recognition software and may include unintentional dictation errors.   Georga Hacking, MD 12/07/21 [phone]

## 2021-12-07 NOTE — Discharge Instructions (Signed)
Please take the steroid once daily for the next 5 days.  Please start taking the [facility] as a controller medications once daily in addition to your albuterol as needed.  Please follow-up with one of the clinics above.

## 2021-12-07 NOTE — ED Provider Triage Note (Signed)
Emergency Medicine Provider Triage Evaluation Note  Justin Reid , a 41 y.o. male  was evaluated in triage.  Pt complains of asthma flare up. Has used inhaler but read "0" on inhaler.  Was around smoking last pm.   Review of Systems  Positive: Cough, wheezing Negative: No fever, chills, nausea or vomiting.   Physical Exam  BP (!) 161/110   Pulse 86   Temp 98.3 F (36.8 C) (Oral)   Resp 18   Ht 5\' 11"  (1.803 m)   Wt 113.4 kg   SpO2 96%   BMI 34.87 kg/m  Gen:   Awake, no distress  Able to speak without difficulty. Occasional cough Resp:  Normal effort, bilateral wheezing noted.  MSK:   Moves extremities without difficulty  Other:    Medical Decision Making  Medically screening exam initiated at 8:38 AM.  Appropriate orders placed.  YUJI WALTH was informed that the remainder of the evaluation will be completed by another provider, this initial triage assessment does not replace that evaluation, and the importance of remaining in the ED until their evaluation is complete.     Kenn File, PA-C 12/07/21 [phone]

## 2021-12-07 NOTE — ED Triage Notes (Signed)
Pt in with co shob pt has hx of asthma. STates has used inhaler without relief. Mild shob noted in triage with dry cough.

## 2021-12-28 ENCOUNTER — Encounter: Payer: Self-pay | Admitting: Internal Medicine

## 2021-12-28 ENCOUNTER — Inpatient Hospital Stay
Admission: EM | Admit: 2021-12-28 | Discharge: 2021-12-29 | DRG: 193 | Disposition: A | Discharge status: Home / Self Care | Payer: Self-pay | Attending: Internal Medicine | Admitting: Internal Medicine

## 2021-12-28 ENCOUNTER — Other Ambulatory Visit: Payer: Self-pay

## 2021-12-28 ENCOUNTER — Emergency Department: Payer: Self-pay

## 2021-12-28 DIAGNOSIS — Z6835 Body mass index (BMI) 35.0-35.9, adult: Secondary | ICD-10-CM

## 2021-12-28 DIAGNOSIS — F17200 Nicotine dependence, unspecified, uncomplicated: Secondary | ICD-10-CM | POA: Diagnosis present

## 2021-12-28 DIAGNOSIS — G4733 Obstructive sleep apnea (adult) (pediatric): Secondary | ICD-10-CM | POA: Diagnosis present

## 2021-12-28 DIAGNOSIS — J101 Influenza due to other identified influenza virus with other respiratory manifestations: Principal | ICD-10-CM | POA: Diagnosis present

## 2021-12-28 DIAGNOSIS — A419 Sepsis, unspecified organism: Secondary | ICD-10-CM | POA: Diagnosis present

## 2021-12-28 DIAGNOSIS — E876 Hypokalemia: Secondary | ICD-10-CM | POA: Diagnosis present

## 2021-12-28 DIAGNOSIS — J9601 Acute respiratory failure with hypoxia: Secondary | ICD-10-CM | POA: Diagnosis present

## 2021-12-28 DIAGNOSIS — Z1152 Encounter for screening for COVID-19: Secondary | ICD-10-CM

## 2021-12-28 DIAGNOSIS — J9811 Atelectasis: Secondary | ICD-10-CM | POA: Diagnosis present

## 2021-12-28 DIAGNOSIS — Z8249 Family history of ischemic heart disease and other diseases of the circulatory system: Secondary | ICD-10-CM

## 2021-12-28 DIAGNOSIS — E860 Dehydration: Secondary | ICD-10-CM | POA: Diagnosis present

## 2021-12-28 DIAGNOSIS — Z79899 Other long term (current) drug therapy: Secondary | ICD-10-CM

## 2021-12-28 DIAGNOSIS — E669 Obesity, unspecified: Secondary | ICD-10-CM | POA: Diagnosis present

## 2021-12-28 DIAGNOSIS — J4551 Severe persistent asthma with (acute) exacerbation: Secondary | ICD-10-CM | POA: Diagnosis present

## 2021-12-28 DIAGNOSIS — Z720 Tobacco use: Secondary | ICD-10-CM

## 2021-12-28 DIAGNOSIS — F17210 Nicotine dependence, cigarettes, uncomplicated: Secondary | ICD-10-CM | POA: Diagnosis present

## 2021-12-28 DIAGNOSIS — I10 Essential (primary) hypertension: Secondary | ICD-10-CM | POA: Diagnosis present

## 2021-12-28 DIAGNOSIS — J45901 Unspecified asthma with (acute) exacerbation: Secondary | ICD-10-CM | POA: Diagnosis present

## 2021-12-28 DIAGNOSIS — Z7951 Long term (current) use of inhaled steroids: Secondary | ICD-10-CM

## 2021-12-28 LAB — COMPREHENSIVE METABOLIC PANEL
ALT: 25 U/L (ref 0–44)
AST: 30 U/L (ref 15–41)
Albumin: 3.8 g/dL (ref 3.5–5.0)
Alkaline Phosphatase: 53 U/L (ref 38–126)
Anion gap: 11 (ref 5–15)
BUN: 13 mg/dL (ref 6–20)
CO2: 23 mmol/L (ref 22–32)
Calcium: 8.6 mg/dL — ABNORMAL LOW (ref 8.9–10.3)
Chloride: 101 mmol/L (ref 98–111)
Creatinine, Ser: 0.81 mg/dL (ref 0.61–1.24)
GFR, Estimated: >60 mL/min (ref >60)
Glucose, Bld: 110 mg/dL — ABNORMAL HIGH (ref 70–99)
Potassium: 2.9 mmol/L — ABNORMAL LOW (ref 3.5–5.1)
Sodium: 135 mmol/L (ref 135–145)
Total Bilirubin: 0.9 mg/dL (ref 0.3–1.2)
Total Protein: 7.1 g/dL (ref 6.5–8.1)

## 2021-12-28 LAB — CBC WITH DIFFERENTIAL/PLATELET
Abs Immature Granulocytes: 0.04 10*3/uL (ref 0.00–0.07)
Basophils Absolute: 0 10*3/uL (ref 0.0–0.1)
Basophils Relative: 0 %
Eosinophils Absolute: 0 10*3/uL (ref 0.0–0.5)
Eosinophils Relative: 1 %
HCT: 36.2 % — ABNORMAL LOW (ref 39.0–52.0)
Hemoglobin: 11.8 g/dL — ABNORMAL LOW (ref 13.0–17.0)
Immature Granulocytes: 1 %
Lymphocytes Relative: 22 %
Lymphs Abs: 1.6 10*3/uL (ref 0.7–4.0)
MCH: 27.2 pg (ref 26.0–34.0)
MCHC: 32.6 g/dL (ref 30.0–36.0)
MCV: 83.4 fL (ref 80.0–100.0)
Monocytes Absolute: 1 10*3/uL (ref 0.1–1.0)
Monocytes Relative: 14 %
Neutro Abs: 4.6 10*3/uL (ref 1.7–7.7)
Neutrophils Relative %: 62 %
Platelets: 372 10*3/uL (ref 150–400)
RBC: 4.34 MIL/uL (ref 4.22–5.81)
RDW: 13.7 % (ref 11.5–15.5)
WBC: 7.3 10*3/uL (ref 4.0–10.5)
nRBC: 0 % (ref 0.0–0.2)

## 2021-12-28 LAB — BLOOD GAS, VENOUS
Acid-Base Excess: 1 mmol/L (ref 0.0–2.0)
Bicarbonate: 23.1 mmol/L (ref 20.0–28.0)
O2 Saturation: 96.5 %
Patient temperature: 37
pCO2, Ven: 29 mmHg — ABNORMAL LOW (ref 44–60)
pH, Ven: 7.51 — ABNORMAL HIGH (ref 7.25–7.43)
pO2, Ven: 72 mmHg — ABNORMAL HIGH (ref 32–45)

## 2021-12-28 LAB — RESP PANEL BY RT-PCR (RSV, FLU A&B, COVID)  RVPGX2
Influenza A by PCR: POSITIVE — AB
Influenza B by PCR: NEGATIVE
Resp Syncytial Virus by PCR: NEGATIVE
SARS Coronavirus 2 by RT PCR: NEGATIVE

## 2021-12-28 LAB — LACTIC ACID, PLASMA: Lactic Acid, Venous: 2.7 mmol/L (ref 0.5–1.9)

## 2021-12-28 LAB — PROCALCITONIN: Procalcitonin: <0.1 ng/mL

## 2021-12-28 LAB — TROPONIN I (HIGH SENSITIVITY): Troponin I (High Sensitivity): 8 ng/L (ref <18)

## 2021-12-28 LAB — MAGNESIUM: Magnesium: 1.9 mg/dL (ref 1.7–2.4)

## 2021-12-28 LAB — BRAIN NATRIURETIC PEPTIDE: B Natriuretic Peptide: 25.8 pg/mL (ref 0.0–100.0)

## 2021-12-28 MED ORDER — METHYLPREDNISOLONE SODIUM SUCC 125 MG IJ SOLR: 125.0000 mg | Freq: Once | INTRAMUSCULAR | Status: DC

## 2021-12-28 MED ORDER — AMLODIPINE BESYLATE 5 MG PO TABS
10.0000 mg | ORAL_TABLET | Freq: Every day | ORAL | Status: DC
  Administered 2021-12-28 – 2021-12-29 (×2): 10 mg via ORAL
  Filled 2021-12-28 (×2): qty 2

## 2021-12-28 MED ORDER — POTASSIUM CHLORIDE CRYS ER 20 MEQ PO TBCR
40.0000 meq | EXTENDED_RELEASE_TABLET | Freq: Once | ORAL | Status: AC
  Administered 2021-12-28: 40 meq via ORAL
  Filled 2021-12-28: qty 2

## 2021-12-28 MED ORDER — ONDANSETRON HCL 4 MG/2ML IJ SOLN: 4.0000 mg | Freq: Three times a day (TID) | INTRAMUSCULAR | Status: DC | PRN

## 2021-12-28 MED ORDER — PANTOPRAZOLE SODIUM 40 MG PO TBEC
40.0000 mg | DELAYED_RELEASE_TABLET | Freq: Two times a day (BID) | ORAL | Status: DC
  Administered 2021-12-28 – 2021-12-29 (×2): 40 mg via ORAL
  Filled 2021-12-28 (×2): qty 1

## 2021-12-28 MED ORDER — IPRATROPIUM-ALBUTEROL 0.5-2.5 (3) MG/3ML IN SOLN
3.0000 mL | Freq: Once | RESPIRATORY_TRACT | Status: AC
  Administered 2021-12-28: 3 mL via RESPIRATORY_TRACT
  Filled 2021-12-28: qty 3

## 2021-12-28 MED ORDER — HYDRALAZINE HCL 20 MG/ML IJ SOLN: 5.0000 mg | INTRAMUSCULAR | Status: DC | PRN

## 2021-12-28 MED ORDER — DM-GUAIFENESIN ER 30-600 MG PO TB12: 1.0000 | ORAL_TABLET | Freq: Two times a day (BID) | ORAL | Status: DC | PRN

## 2021-12-28 MED ORDER — ACETAMINOPHEN 325 MG PO TABS: 650.0000 mg | ORAL_TABLET | Freq: Four times a day (QID) | ORAL | Status: DC | PRN

## 2021-12-28 MED ORDER — ALBUTEROL SULFATE (2.5 MG/3ML) 0.083% IN NEBU
2.5000 mg | INHALATION_SOLUTION | RESPIRATORY_TRACT | Status: DC | PRN
  Administered 2021-12-29: 2.5 mg via RESPIRATORY_TRACT

## 2021-12-28 MED ORDER — IPRATROPIUM-ALBUTEROL 0.5-2.5 (3) MG/3ML IN SOLN
3.0000 mL | RESPIRATORY_TRACT | Status: DC
  Administered 2021-12-28 – 2021-12-29 (×5): 3 mL via RESPIRATORY_TRACT
  Filled 2021-12-28 (×6): qty 3

## 2021-12-28 MED ORDER — POTASSIUM CHLORIDE 10 MEQ/100ML IV SOLN
10.0000 meq | INTRAVENOUS | Status: AC
  Administered 2021-12-28 (×2): 10 meq via INTRAVENOUS
  Filled 2021-12-28 (×2): qty 100

## 2021-12-28 MED ORDER — MAGNESIUM SULFATE 2 GM/50ML IV SOLN
2.0000 g | Freq: Once | INTRAVENOUS | Status: AC
  Administered 2021-12-28: 2 g via INTRAVENOUS
  Filled 2021-12-28: qty 50

## 2021-12-28 MED ORDER — ACETAMINOPHEN 325 MG PO TABS
650.0000 mg | ORAL_TABLET | Freq: Once | ORAL | Status: AC
  Administered 2021-12-28: 650 mg via ORAL
  Filled 2021-12-28: qty 2

## 2021-12-28 MED ORDER — ENOXAPARIN SODIUM 40 MG/0.4ML IJ SOSY: 40.0000 mg | PREFILLED_SYRINGE | INTRAMUSCULAR | Status: DC

## 2021-12-28 MED ORDER — NICOTINE 21 MG/24HR TD PT24
21.0000 mg | MEDICATED_PATCH | Freq: Every day | TRANSDERMAL | Status: DC
  Administered 2021-12-28: 21 mg via TRANSDERMAL
  Filled 2021-12-28 (×2): qty 1

## 2021-12-28 MED ORDER — ENOXAPARIN SODIUM 60 MG/0.6ML IJ SOSY
0.5000 mg/kg | PREFILLED_SYRINGE | INTRAMUSCULAR | Status: DC
  Administered 2021-12-28: 57.5 mg via SUBCUTANEOUS
  Filled 2021-12-28: qty 0.6

## 2021-12-28 MED ORDER — METHYLPREDNISOLONE SODIUM SUCC 40 MG IJ SOLR
40.0000 mg | Freq: Two times a day (BID) | INTRAMUSCULAR | Status: DC
  Administered 2021-12-28 – 2021-12-29 (×2): 40 mg via INTRAVENOUS
  Filled 2021-12-28 (×2): qty 1

## 2021-12-28 MED ORDER — OSELTAMIVIR PHOSPHATE 75 MG PO CAPS
75.0000 mg | ORAL_CAPSULE | Freq: Two times a day (BID) | ORAL | Status: DC
  Administered 2021-12-28 – 2021-12-29 (×3): 75 mg via ORAL
  Filled 2021-12-28 (×3): qty 1

## 2021-12-28 MED ORDER — SODIUM CHLORIDE 0.9 % IV BOLUS
1500.0000 mL | Freq: Once | INTRAVENOUS | Status: AC
  Administered 2021-12-28: 1500 mL via INTRAVENOUS

## 2021-12-28 NOTE — H&P (Signed)
History and Physical    Justin Reid OXB:[id number] DOB: 01-20-1980 DOA: 12/28/2021  Referring MD/NP/PA:   PCP: Pcp, No   Patient coming from:  The patient is coming from home.        Chief Complaint: SOB  HPI: Justin Reid is a 41 y.o. male with medical history significant of hypertension, asthma, tobacco abuse, OSA not on CPAP, who presents with shortness breath.  Patient states that he has been sick for only 2 days.  His symptoms include dry cough, wheezing, shortness breath, fever or chills, malaise, generalized weakness, body aches.  His shortness has been progressively worsening.  Patient had chest pressure and tightness earlier, which has resolved.  He denies chest pain to me now.  Denies nausea vomiting, diarrhea or abdominal pain.  No symptoms of UTI.  Patient is normally using oxygen normally, but was found to have severe respiratory distress, using accessory muscle for breathing, BiPAP was started in ED, then weaned off BiPAP, started on 2 L oxygen with 97% saturation.  Data reviewed independently and ED Course: pt was found to have positive flu PCR, potassium 2.9, WBC 7.3, BMP 25.8, troponin level 8, renal function okay, temperature 101.5, blood pressure 152/100, heart rate 20.  Chest x-ray showed left basilar atelectasis.  ABG with pH 7.51, CO2 29, O2 73.  Patient is admitted to PCU as inpatient.   EKG: I have personally reviewed.  Sinus rhythm, QTc 499, nonspecific T wave change   Review of Systems:   General: has fevers, chills, no body weight gain, has fatigue HEENT: no blurry vision, hearing changes or sore throat Respiratory: has dyspnea, coughing, wheezing CV: has chest pressure, no palpitations GI: no nausea, vomiting, abdominal pain, diarrhea, constipation GU: no dysuria, burning on urination, increased urinary frequency, hematuria  Ext: no leg edema Neuro: no unilateral weakness, numbness, or tingling, no vision change or hearing loss Skin: no rash, no  skin tear. MSK: No muscle spasm, no deformity, no limitation of range of movement in spin Heme: No easy bruising.  Travel history: No recent long distant travel.   Allergy: No Known Allergies  Past Medical History:  Diagnosis Date   Arthritis    Asthma    Hypertension     History reviewed. No pertinent surgical history.  Social History:  reports that he has been smoking cigarettes. He has a 2.50 pack-year smoking history. He has never used smokeless tobacco. He reports current alcohol use. He reports that he does not use drugs.  Family History:  Family History  Problem Relation Age of Onset   Hypertension Father      Prior to Admission medications   Medication Sig Start Date End Date Taking? Authorizing Provider  albuterol (VENTOLIN HFA) 108 (90 Base) MCG/ACT inhaler Inhale 2 puffs into the lungs once every 4 (four) hours as needed. 12/04/20   Tresa Moore, MD  albuterol (VENTOLIN HFA) 108 (90 Base) MCG/ACT inhaler Inhale 2 puffs into the lungs every 6 (six) hours as needed for wheezing or shortness of breath. 05/22/21   Tommi Rumps, PA-C  albuterol (VENTOLIN HFA) 108 (90 Base) MCG/ACT inhaler Inhale 2 puffs into the lungs every 4 (four) hours as needed for wheezing or shortness of breath. 09/05/21   Dionne Bucy, MD  amLODipine (NORVASC) 10 MG tablet Take 1 tablet (10 mg total) by mouth daily. 10/11/19 10/10/20  Burnadette Pop, MD  fluticasone furoate-vilanterol (BREO ELLIPTA) 200-25 MCG/ACT AEPB Inhale 1 puff into the lungs once daily. 12/07/21  Georga HackingMcHugh, Kelly Rose, MD  ibuprofen (ADVIL) 600 MG tablet Take 1 tablet (600 mg total) by mouth every 6 (six) hours as needed. 09/05/21   Dionne BucySiadecki, Sebastian, MD  lidocaine (LIDODERM) 5 % Place 1 patch onto the skin every 12 (twelve) hours. Remove & Discard patch within 12 hours or as directed by MD 08/04/21   Sharman CheekStafford, Phillip, MD  pantoprazole (PROTONIX) 40 MG tablet Take 1 tablet (40 mg total) by mouth 2 (two) times daily  before a meal for 7 days. 11/12/20 11/19/20  Gillis SantaKumar, Dileep, MD  predniSONE (DELTASONE) 50 MG tablet Take 1 pill daily for the next 5 days 12/07/21   Georga HackingMcHugh, Kelly Rose, MD  tobramycin (TOBREX) 0.3 % ophthalmic solution Place 2 drops into the right eye every 4 (four) hours. While awake 07/15/21   Tommi RumpsSummers, Rhonda L, PA-C    Physical Exam: Vitals:   12/28/21 0750 12/28/21 0753 12/28/21 1308  BP: (!) 152/100  (!) 167/122  Pulse: (!) 110  89  Resp: 20    Temp: (!) 101.5 F (38.6 C)  99.4 F (37.4 C)  TempSrc: Oral  Oral  SpO2: 97%  95%  Weight:  114 kg   Height:  5\' 11"  (1.803 m)    General: in acute respiratory distress HEENT:       Eyes: PERRL, EOMI, no scleral icterus.       ENT: No discharge from the ears and nose, no pharynx injection, no tonsillar enlargement.        Neck: No JVD, no bruit, no mass felt. Heme: No neck lymph node enlargement. Cardiac: S1/S2, RRR, No murmurs, No gallops or rubs. Respiratory: Has wheezing bilaterally GI: Soft, nondistended, nontender, no rebound pain, no organomegaly, BS present. GU: No hematuria Ext: No pitting leg edema bilaterally. 1+DP/PT pulse bilaterally. Musculoskeletal: No joint deformities, No joint redness or warmth, no limitation of ROM in spin. Skin: No rashes.  Neuro: Alert, oriented X3, cranial nerves II-XII grossly intact, moves all extremities normally. Psych: Patient is not psychotic, no suicidal or hemocidal ideation.  Labs on Admission: I have personally reviewed following labs and imaging studies  CBC: Recent Labs  Lab 12/28/21 0806  WBC 7.3  NEUTROABS 4.6  HGB 11.8*  HCT 36.2*  MCV 83.4  PLT 372   Basic Metabolic Panel: Recent Labs  Lab 12/28/21 0804 12/28/21 0806  NA  --  135  K  --  2.9*  CL  --  101  CO2  --  23  GLUCOSE  --  110*  BUN  --  13  CREATININE  --  0.81  CALCIUM  --  8.6*  MG 1.9  --    GFR: Estimated Creatinine Clearance: 155.7 mL/min (by C-G formula based on SCr of 0.81 mg/dL). Liver  Function Tests: Recent Labs  Lab 12/28/21 0806  AST 30  ALT 25  ALKPHOS 53  BILITOT 0.9  PROT 7.1  ALBUMIN 3.8   No results for input(s): "LIPASE", "AMYLASE" in the last 168 hours. No results for input(s): "AMMONIA" in the last 168 hours. Coagulation Profile: No results for input(s): "INR", "PROTIME" in the last 168 hours. Cardiac Enzymes: No results for input(s): "CKTOTAL", "CKMB", "CKMBINDEX", "TROPONINI" in the last 168 hours. BNP (last 3 results) No results for input(s): "PROBNP" in the last 8760 hours. HbA1C: No results for input(s): "HGBA1C" in the last 72 hours. CBG: No results for input(s): "GLUCAP" in the last 168 hours. Lipid Profile: No results for input(s): "CHOL", "HDL", "LDLCALC", "TRIG", "CHOLHDL", "LDLDIRECT"  in the last 72 hours. Thyroid Function Tests: No results for input(s): "TSH", "T4TOTAL", "FREET4", "T3FREE", "THYROIDAB" in the last 72 hours. Anemia Panel: No results for input(s): "VITAMINB12", "FOLATE", "FERRITIN", "TIBC", "IRON", "RETICCTPCT" in the last 72 hours. Urine analysis: No results found for: "COLORURINE", "APPEARANCEUR", "LABSPEC", "PHURINE", "GLUCOSEU", "HGBUR", "BILIRUBINUR", "KETONESUR", "PROTEINUR", "UROBILINOGEN", "NITRITE", "LEUKOCYTESUR" Sepsis Labs: @LABRCNTIP (procalcitonin:4,lacticidven:4) ) Recent Results (from the past 240 hour(s))  Resp panel by RT-PCR (RSV, Flu A&B, Covid) Anterior Nasal Swab     Status: Abnormal   Collection Time: 12/28/21  8:06 AM   Specimen: Anterior Nasal Swab  Result Value Ref Range Status   SARS Coronavirus 2 by RT PCR NEGATIVE NEGATIVE Final    Comment: (NOTE) SARS-CoV-2 target nucleic acids are NOT DETECTED.  The SARS-CoV-2 RNA is generally detectable in upper respiratory specimens during the acute phase of infection. The lowest concentration of SARS-CoV-2 viral copies this assay can detect is 138 copies/mL. A negative result does not preclude SARS-Cov-2 infection and should not be used as the sole  basis for treatment or other patient management decisions. A negative result may occur with  improper specimen collection/handling, submission of specimen other than nasopharyngeal swab, presence of viral mutation(s) within the areas targeted by this assay, and inadequate number of viral copies(<138 copies/mL). A negative result must be combined with clinical observations, patient history, and epidemiological information. The expected result is Negative.  Fact Sheet for Patients:  12/30/21  Fact Sheet for Healthcare Providers:  BloggerCourse.com  This test is no t yet approved or cleared by the [employer] FDA and  has been authorized for detection and/or diagnosis of SARS-CoV-2 by FDA under an Emergency Use Authorization (EUA). This EUA will remain  in effect (meaning this test can be used) for the duration of the COVID-19 declaration under Section 564(b)(1) of the Act, 21 U.S.C.section 360bbb-3(b)(1), unless the authorization is terminated  or revoked sooner.       Influenza A by PCR POSITIVE (A) NEGATIVE Final   Influenza B by PCR NEGATIVE NEGATIVE Final    Comment: (NOTE) The Xpert Xpress SARS-CoV-2/FLU/RSV plus assay is intended as an aid in the diagnosis of influenza from Nasopharyngeal swab specimens and should not be used as a sole basis for treatment. Nasal washings and aspirates are unacceptable for Xpert Xpress SARS-CoV-2/FLU/RSV testing.  Fact Sheet for Patients: [place]  Fact Sheet for Healthcare Providers: BloggerCourse.com  This test is not yet approved or cleared by the [employer].[place] FDA and has been authorized for detection and/or diagnosis of SARS-CoV-2 by FDA under an Emergency Use Authorization (EUA). This EUA will remain in effect (meaning this test can be used) for the duration of the COVID-19 declaration under Section 564(b)(1) of the  Act, 21 U.S.C. section 360bbb-3(b)(1), unless the authorization is terminated or revoked.     Resp Syncytial Virus by PCR NEGATIVE NEGATIVE Final    Comment: (NOTE) Fact Sheet for Patients: [place]  Fact Sheet for Healthcare Providers: BloggerCourse.com  This test is not yet approved or cleared by the [employer].[place] FDA and has been authorized for detection and/or diagnosis of SARS-CoV-2 by FDA under an Emergency Use Authorization (EUA). This EUA will remain in effect (meaning this test can be used) for the duration of the COVID-19 declaration under Section 564(b)(1) of the Act, 21 U.S.C. section 360bbb-3(b)(1), unless the authorization is terminated or revoked.  Performed at [facility], [address] [place], [place]      Radiological Exams on Admission: DG Chest Portable  1 View  Result Date: 12/28/2021 CLINICAL DATA:  History chest pain EXAM: PORTABLE CHEST 1 VIEW COMPARISON:  December 07, 2021, October 07, 2019 FINDINGS: The cardiomediastinal silhouette is unchanged in contour. No pleural effusion. No pneumothorax. LEFT basilar linear opacity, likely atelectasis. Mildly prominent bronchial wall markings, similar in comparison to multiple priors. IMPRESSION: 1. LEFT basilar atelectasis. No acute cardiopulmonary abnormality. 2. Mildly prominent bronchial wall markings, similar in comparison to multiple priors and likely reflecting underlying small airways disease such as asthma. Electronically Signed   By: Meda Klinefelter M.D.   On: 12/28/2021 08:43      Assessment/Plan Principal Problem:   Influenza A Active Problems:   Asthma exacerbation   Acute respiratory failure with hypoxia (HCC)   Sepsis (HCC)   Hypokalemia   Tobacco abuse   OSA (obstructive sleep apnea)   Obesity (BMI 30-39.9)   Assessment and Plan:   Acute respiratory failure with hypoxia and severe sepsis due to influenza A,  and asthma exacerbation: Patient meets criteria for severe sepsis with heart rate 100, fever 101.5.  Lactic acid 2.7.  -Admitted to PCU as inpatient -Started Tamiflu -Hemogram magnesium sulfate -Blood culture -IV fluid: 1.5 L NS -Bronchodilators -trend lactic acid -check procalcitonin level --> < 0.10  Hypokalemia: Potassium 2.9 -Repleted potassium -Check lithium level  Tobacco abuse -Nicotine patch  OSA (obstructive sleep apnea): Patient used to be on CPAP, currently not using CPAP -Started CPAP  Obesity (BMI 30-39.9): Body weight 114 kg, BMI 35.05 -Healthy diet and exercise -Encourage losing weight     DVT ppx:  SQ Lovenox  Code Status: Full code  Family Communication:   Yes, patient's wife   at bed side.    Disposition Plan:  Anticipate discharge back to previous environment  Consults called:  none  Admission status and Level of care: Progressive:   as inpt      Dispo: The patient is from: Home              Anticipated d/c is to: Home              Anticipated d/c date is: 2 days              Patient currently is not medically stable to d/c.    Severity of Illness:  The appropriate patient status for this patient is INPATIENT. Inpatient status is judged to be reasonable and necessary in order to provide the required intensity of service to ensure the patient's safety. The patient's presenting symptoms, physical exam findings, and initial radiographic and laboratory data in the context of their chronic comorbidities is felt to place them at high risk for further clinical deterioration. Furthermore, it is not anticipated that the patient will be medically stable for discharge from the hospital within 2 midnights of admission.   * I certify that at the point of admission it is my clinical judgment that the patient will require inpatient hospital care spanning beyond 2 midnights from the point of admission due to high intensity of service, high risk for further  deterioration and high frequency of surveillance required.*       Date of Service 12/28/2021    Lorretta Harp [facility]   If 7PM-7AM, please contact night-coverage [email or web address] 12/28/2021, 6:15 PM

## 2021-12-28 NOTE — Progress Notes (Signed)
PHARMACIST - PHYSICIAN COMMUNICATION  CONCERNING:  Enoxaparin (Lovenox) for DVT Prophylaxis    RECOMMENDATION: Patient was prescribed enoxaprin 40mg  q24 hours for VTE prophylaxis.   Filed Weights   12/28/21 0753  Weight: 114 kg (251 lb 5.2 oz)    Body mass index is 35.05 kg/m.  Estimated Creatinine Clearance: 155.7 mL/min (by C-G formula based on SCr of 0.81 mg/dL).   Based on [facility] policy patient is candidate for enoxaparin 0.5mg /kg TBW SQ every 24 hours based on BMI being >30.  DESCRIPTION: Pharmacy has adjusted enoxaparin dose per [facility] policy.  Patient is now receiving enoxaparin 57.5 mg every 24 hours    [facility], PharmD Clinical Pharmacist  12/28/2021 10:32 AM

## 2021-12-28 NOTE — ED Provider Notes (Signed)
[facility] Provider Note    Event Date/Time   First MD Initiated Contact with Patient 12/28/21 [phone]     (approximate)   History   Chest Pain and Shortness of Breath   HPI  Justin Reid is a 41 y.o. male here with cough and shortness of breath.  History is somewhat limited due to dyspnea.  He states that over the last 2 days, he has had progressively worsening cough with wheezing.  He is felt increasingly short of breath.  He could barely catch his breath even walk across the house today so he called EMS.  He arrives requiring 2 L nasal cannula.  He states he had chills.  He is felt general fatigue.  Denies known specific sick contacts.  Denies any significantly or aggravating factors.  He states he has a history of asthma but has been using his inhaler without significant relief.     Physical Exam   Triage Vital Signs: ED Triage Vitals  Enc Vitals Group     BP 12/28/21 0750 (!) 152/100     Pulse Rate 12/28/21 0750 (!) 110     Resp 12/28/21 0750 20     Temp 12/28/21 0750 (!) 101.5 F (38.6 C)     Temp Source 12/28/21 0750 Oral     SpO2 12/28/21 0750 97 %     Weight 12/28/21 0753 251 lb 5.2 oz (114 kg)     Height 12/28/21 0753 5\' 11"  (1.803 m)     Head Circumference --      Peak Flow --      Pain Score 12/28/21 0753 9     Pain Loc --      Pain Edu? --      Excl. in GC? --     Most recent vital signs: Vitals:   12/28/21 0750  BP: (!) 152/100  Pulse: (!) 110  Resp: 20  Temp: (!) 101.5 F (38.6 C)  SpO2: 97%     General: Awake, in mild respiratory distress. CV:  Good peripheral perfusion.  Tachycardic. Resp:  Lungs with diffuse inspiratory and expiratory wheezes, tachypnea. Abd:  No distention.  No tenderness. Other:  Mildly dry mucous membranes.   ED Results / Procedures / Treatments   Labs (all labs ordered are listed, but only abnormal results are displayed) Labs Reviewed  RESP PANEL BY RT-PCR (RSV, FLU A&B, COVID)  [id number] -  Abnormal; Notable for the following components:      Result Value   Influenza A by PCR POSITIVE (*)    All other components within normal limits  CBC WITH DIFFERENTIAL/PLATELET - Abnormal; Notable for the following components:   Hemoglobin 11.8 (*)    HCT 36.2 (*)    All other components within normal limits  COMPREHENSIVE METABOLIC PANEL - Abnormal; Notable for the following components:   Potassium 2.9 (*)    Glucose, Bld 110 (*)    Calcium 8.6 (*)    All other components within normal limits  BLOOD GAS, VENOUS - Abnormal; Notable for the following components:   pH, Ven 7.51 (*)    pCO2, Ven 29 (*)    pO2, Ven 72 (*)    All other components within normal limits  CULTURE, BLOOD (ROUTINE X 2)  CULTURE, BLOOD (ROUTINE X 2)  BRAIN NATRIURETIC PEPTIDE  PROCALCITONIN  MAGNESIUM  LACTIC ACID, PLASMA  LACTIC ACID, PLASMA  HIV ANTIBODY (ROUTINE TESTING W REFLEX)  TROPONIN I (HIGH SENSITIVITY)  TROPONIN I (HIGH  SENSITIVITY)     EKG    RADIOLOGY Chest x-ray: Left basilar atelectasis, prominent bronchial wall markings   I also independently reviewed and agree with radiologist interpretations.   PROCEDURES:  Critical Care performed: Yes, see critical care procedure note(s)  .Critical Care  Performed by: Shaune Pollack, MD Authorized by: Shaune Pollack, MD   Critical care provider statement:    Critical care time (minutes):  30   Critical care time was exclusive of:  Separately billable procedures and treating other patients   Critical care was necessary to treat or prevent imminent or life-threatening deterioration of the following conditions:  Cardiac failure, circulatory failure and respiratory failure   Critical care was time spent personally by me on the following activities:  Development of treatment plan with patient or surrogate, discussions with consultants, evaluation of patient's response to treatment, examination of patient, ordering and review of laboratory  studies, ordering and review of radiographic studies, ordering and performing treatments and interventions, pulse oximetry, re-evaluation of patient's condition and review of old charts     MEDICATIONS ORDERED IN ED: Medications  ipratropium-albuterol (DUONEB) 0.5-2.5 (3) MG/3ML nebulizer solution 3 mL (has no administration in time range)  methylPREDNISolone sodium succinate (SOLU-MEDROL) 125 mg/2 mL injection 125 mg (125 mg Intravenous Not Given 12/28/21 0918)  oseltamivir (TAMIFLU) capsule 75 mg (has no administration in time range)  potassium chloride SA (KLOR-CON M) CR tablet 40 mEq (has no administration in time range)  potassium chloride 10 mEq in 100 mL IVPB (has no administration in time range)  ondansetron (ZOFRAN) injection 4 mg (has no administration in time range)  hydrALAZINE (APRESOLINE) injection 5 mg (has no administration in time range)  acetaminophen (TYLENOL) tablet 650 mg (has no administration in time range)  nicotine (NICODERM CQ - dosed in mg/24 hours) patch 21 mg (has no administration in time range)  ipratropium-albuterol (DUONEB) 0.5-2.5 (3) MG/3ML nebulizer solution 3 mL (has no administration in time range)  albuterol (PROVENTIL) (2.5 MG/3ML) 0.083% nebulizer solution 2.5 mg (has no administration in time range)  dextromethorphan-guaiFENesin (MUCINEX DM) 30-600 MG per 12 hr tablet 1 tablet (has no administration in time range)  methylPREDNISolone sodium succinate (SOLU-MEDROL) 40 mg/mL injection 40 mg (has no administration in time range)  magnesium sulfate IVPB 2 g 50 mL (has no administration in time range)  sodium chloride 0.9 % bolus 1,500 mL (has no administration in time range)  enoxaparin (LOVENOX) injection 57.5 mg (has no administration in time range)  ipratropium-albuterol (DUONEB) 0.5-2.5 (3) MG/3ML nebulizer solution 3 mL (3 mLs Nebulization Given 12/28/21 0917)  ipratropium-albuterol (DUONEB) 0.5-2.5 (3) MG/3ML nebulizer solution 3 mL (3 mLs  Nebulization Given 12/28/21 0917)  acetaminophen (TYLENOL) tablet 650 mg (650 mg Oral Given 12/28/21 0918)     IMPRESSION / MDM / ASSESSMENT AND PLAN / ED COURSE  I reviewed the triage vital signs and the nursing notes.                              Differential diagnosis includes, but is not limited to, influenza, COPD/asthma, CAP, CHF, ACS, PE.  Patient's presentation is most consistent with acute presentation with potential threat to life or bodily function.  The patient is on the cardiac monitor to evaluate for evidence of arrhythmia and/or significant heart rate changes.  41 yo M with h/o asthma here with cough, SOB, fatigue. Pt arrives febrile, tachycardic, and tachypneic with diffuse wheezing and diminished aeration. Placed on  BIPAP for his increased WOB. Duonebs x 3 given. IV steroids given as well. Labs as above.  CBC shows no leukocytosis.  Patient has mild anemia noted.  Blood gas shows relative alkalosis likely from hyperventilation.  CMP shows hypokalemia which will be repleted.  BNP and troponin normal and patient has no evidence of ischemia on EKG.  Procalcitonin negative.  Influenza A positive.  Patient will be started on Tamiflu and admitted to medicine for acute hypoxic respiratory failure.     FINAL CLINICAL IMPRESSION(S) / ED DIAGNOSES   Final diagnoses:  Acute respiratory failure with hypoxia (HCC)  Influenza A     Rx / DC Orders   ED Discharge Orders     None        Note:  This document was prepared using Dragon voice recognition software and may include unintentional dictation errors.   Shaune Pollack, MD 12/28/21 1130

## 2021-12-28 NOTE — ED Triage Notes (Signed)
Pt c/o sharp substernal CP starting yesterday morning. Pt stated SOB started at same time. Hx of asthma. No relief from his inhaler.

## 2021-12-29 LAB — BASIC METABOLIC PANEL
Anion gap: 9 (ref 5–15)
BUN: 10 mg/dL (ref 6–20)
CO2: 25 mmol/L (ref 22–32)
Calcium: 8.6 mg/dL — ABNORMAL LOW (ref 8.9–10.3)
Chloride: 103 mmol/L (ref 98–111)
Creatinine, Ser: 0.8 mg/dL (ref 0.61–1.24)
GFR, Estimated: >60 mL/min (ref >60)
Glucose, Bld: 134 mg/dL — ABNORMAL HIGH (ref 70–99)
Potassium: 4.1 mmol/L (ref 3.5–5.1)
Sodium: 137 mmol/L (ref 135–145)

## 2021-12-29 LAB — CBC
HCT: 39.2 % (ref 39.0–52.0)
Hemoglobin: 12.8 g/dL — ABNORMAL LOW (ref 13.0–17.0)
MCH: 27.7 pg (ref 26.0–34.0)
MCHC: 32.7 g/dL (ref 30.0–36.0)
MCV: 84.8 fL (ref 80.0–100.0)
Platelets: 392 10*3/uL (ref 150–400)
RBC: 4.62 MIL/uL (ref 4.22–5.81)
RDW: 14 % (ref 11.5–15.5)
WBC: 7.7 10*3/uL (ref 4.0–10.5)
nRBC: 0 % (ref 0.0–0.2)

## 2021-12-29 LAB — TROPONIN I (HIGH SENSITIVITY): Troponin I (High Sensitivity): 6 ng/L (ref <18)

## 2021-12-29 MED ORDER — AMLODIPINE BESYLATE 10 MG PO TABS: 10.0000 mg | ORAL_TABLET | Freq: Every day | ORAL | 1 refills | Status: DC

## 2021-12-29 MED ORDER — NICOTINE 21 MG/24HR TD PT24: 21.0000 mg | MEDICATED_PATCH | Freq: Every day | TRANSDERMAL | 0 refills | Status: DC

## 2021-12-29 MED ORDER — OSELTAMIVIR PHOSPHATE 75 MG PO CAPS: 75.0000 mg | ORAL_CAPSULE | Freq: Two times a day (BID) | ORAL | 0 refills | Status: AC

## 2021-12-29 MED ORDER — DEXTROMETHORPHAN HBR 15 MG/5ML PO SYRP: 10.0000 mL | ORAL_SOLUTION | Freq: Four times a day (QID) | ORAL | 0 refills | Status: DC | PRN

## 2021-12-29 MED ORDER — PREDNISONE 10 MG PO TABS: ORAL_TABLET | ORAL | 0 refills | Status: AC

## 2021-12-29 NOTE — Assessment & Plan Note (Signed)
Counseled

## 2021-12-29 NOTE — Hospital Course (Signed)
41 year old male with past medical history obstructive sleep apnea not on CPAP, tobacco abuse, hypertension who presented to the emergency room on 12/25 morning with cough, wheezing and shortness of breath x 2 days.  Patient noted to be hypoxic initially requiring BiPAP and then down to oxygen by nasal cannula.  Workup revealed positive for the flu and he was started on Tamiflu and admitted to the hospitalist service.  Also started on steroids and nebulizers.

## 2021-12-29 NOTE — Assessment & Plan Note (Addendum)
Treated with Tamiflu, prescription given on discharge.

## 2021-12-29 NOTE — Assessment & Plan Note (Signed)
Secondary to flu.  Improved with steroids plus nebulizer send additionally oxygen

## 2021-12-29 NOTE — Assessment & Plan Note (Signed)
Secondary to initial dehydration.  Replaced as needed.

## 2021-12-29 NOTE — Assessment & Plan Note (Signed)
Meets criteria with BMI greater than 30 ?

## 2021-12-29 NOTE — ED Notes (Signed)
This tech ambulated pt down the hallway and back with pulse ox. Pt is stating at this time that he feels better after his breathing treatment. Pt ambulated well without assistance. Pt beginning sats on RA were 94%. While walking, pt maintained between 95%-100%. Pt states "I feel much better. When I first came in I couldn't even do this" -- pt talking about walking. PT denies SHOB,or CP and there are no obvious signs of an increase in work of breathing from pt.

## 2021-12-29 NOTE — Discharge Summary (Signed)
Physician Discharge Summary   Patient: Justin Reid MRN: [id number] DOB: 06-11-1980  Admit date:     12/28/2021  Discharge date: 12/29/21  Discharge Physician: Hollice Espy   PCP: Pcp, No   Recommendations at discharge:   New medication: Prednisone taper New medication: Tamiflu x 4 days Patient given referral for primary care physician  Discharge Diagnoses: Principal Problem:   Influenza A Active Problems:   Hypokalemia   Tobacco abuse   OSA (obstructive sleep apnea)   Obesity (BMI 30-39.9)  Resolved Problems:   Acute respiratory failure with hypoxia (HCC)   Asthma exacerbation  Hospital Course: 41 year old male with past medical history obstructive sleep apnea not on CPAP, tobacco abuse, hypertension who presented to the emergency room on 12/25 morning with cough, wheezing and shortness of breath x 2 days.  Patient noted to be hypoxic initially requiring BiPAP and then down to oxygen by nasal cannula.  Workup revealed positive for the flu and he was started on Tamiflu and admitted to the hospitalist service.  Also started on steroids and nebulizers.  Assessment and Plan: * Influenza A Treated with Tamiflu, prescription given on discharge.  Acute respiratory failure with hypoxia (HCC)-resolved as of 12/29/2021 Secondary to asthma exacerbation caused by flu.  By 12/26, able to ambulate on room air and feeling much better.  Sepsis ruled out  Asthma exacerbation-resolved as of 12/29/2021 Secondary to flu.  Improved with steroids plus nebulizer send additionally oxygen  Hypokalemia Secondary to initial dehydration.  Replaced as needed.  Tobacco abuse Counseled.  OSA (obstructive sleep apnea) Not using CPAP.  Once he gets a primary care physician, can get it further evaluated, advised patient.  Obesity (BMI 30-39.9) Meets criteria with BMI greater than 30         Consultants: None Procedures performed: None Disposition: Home Diet recommendation:   Discharge Diet Orders (From admission, onward)     Start     Ordered   12/29/21 0000  Diet - low sodium heart healthy        12/29/21 1444           Low-sodium DISCHARGE MEDICATION: Allergies as of 12/29/2021   No Known Allergies      Medication List     STOP taking these medications    pantoprazole 40 MG tablet Commonly known as: PROTONIX       TAKE these medications    albuterol 108 (90 Base) MCG/ACT inhaler Commonly known as: VENTOLIN HFA Inhale 2 puffs into the lungs once every 4 (four) hours as needed.   amLODipine 10 MG tablet Commonly known as: NORVASC Take 1 tablet (10 mg total) by mouth daily.   dextromethorphan 15 MG/5ML syrup Take 10 mLs (30 mg total) by mouth 4 (four) times daily as needed for cough.   fluticasone furoate-vilanterol 200-25 MCG/ACT Aepb Commonly known as: BREO ELLIPTA Inhale 1 puff into the lungs once daily.   ibuprofen 600 MG tablet Commonly known as: ADVIL Take 1 tablet (600 mg total) by mouth every 6 (six) hours as needed.   nicotine 21 mg/24hr patch Commonly known as: NICODERM CQ - dosed in mg/24 hours Place 1 patch (21 mg total) onto the skin daily. Start taking on: December 30, 2021   oseltamivir 75 MG capsule Commonly known as: TAMIFLU Take 1 capsule (75 mg total) by mouth 2 (two) times daily for 4 days.   predniSONE 10 MG tablet Commonly known as: DELTASONE Take 4 tablets (40 mg total) by mouth 2 (two) times  daily with a meal for 1 day, THEN 3 tablets (30 mg total) 2 (two) times daily with a meal for 1 day, THEN 2 tablets (20 mg total) 2 (two) times daily with a meal for 1 day, THEN 1 tablet (10 mg total) 2 (two) times daily with a meal for 1 day. .. Start taking on: December 29, 2021 What changed:  medication strength See the new instructions.        Follow-up Information     Bacigalupo, Marzella Schlein, MD. Schedule an appointment as soon as possible for a visit.   Specialty: Family Medicine Why: New primary  care physician Contact information: [address] [place] [address] [place] [address] [phone]                Discharge Exam: Ceasar Mons Weights   12/28/21 0753  Weight: 114 kg   General: Alert and oriented x 3, no acute distress Cardiovascular: Regular rate and rhythm, S1-S2 Lungs: Clear to auscultation bilaterally  Condition at discharge: good  The results of significant diagnostics from this hospitalization (including imaging, microbiology, ancillary and laboratory) are listed below for reference.   Imaging Studies: DG Chest Portable 1 View  Result Date: 12/28/2021 CLINICAL DATA:  History chest pain EXAM: PORTABLE CHEST 1 VIEW COMPARISON:  December 07, 2021, October 07, 2019 FINDINGS: The cardiomediastinal silhouette is unchanged in contour. No pleural effusion. No pneumothorax. LEFT basilar linear opacity, likely atelectasis. Mildly prominent bronchial wall markings, similar in comparison to multiple priors. IMPRESSION: 1. LEFT basilar atelectasis. No acute cardiopulmonary abnormality. 2. Mildly prominent bronchial wall markings, similar in comparison to multiple priors and likely reflecting underlying small airways disease such as asthma. Electronically Signed   By: Meda Klinefelter M.D.   On: 12/28/2021 08:43   DG Chest 2 View  Result Date: 12/07/2021 CLINICAL DATA:  Pt complains of shob pt has hx of asthma. STates has used inhaler without relief. Mild shob noted in triage with dry cough. EXAM: CHEST - 2 VIEW COMPARISON:  09/05/2021 FINDINGS: Lungs are clear. Heart size and mediastinal contours are within normal limits. No effusion. Visualized bones unremarkable. IMPRESSION: No acute cardiopulmonary disease. Electronically Signed   By: Corlis Leak M.D.   On: 12/07/2021 09:08    Microbiology: Results for orders placed or performed during the hospital encounter of 12/28/21  Resp panel by RT-PCR (RSV, Flu A&B, Covid) Anterior Nasal Swab     Status: Abnormal   Collection Time:  12/28/21  8:06 AM   Specimen: Anterior Nasal Swab  Result Value Ref Range Status   SARS Coronavirus 2 by RT PCR NEGATIVE NEGATIVE Final    Comment: (NOTE) SARS-CoV-2 target nucleic acids are NOT DETECTED.  The SARS-CoV-2 RNA is generally detectable in upper respiratory specimens during the acute phase of infection. The lowest concentration of SARS-CoV-2 viral copies this assay can detect is 138 copies/mL. A negative result does not preclude SARS-Cov-2 infection and should not be used as the sole basis for treatment or other patient management decisions. A negative result may occur with  improper specimen collection/handling, submission of specimen other than nasopharyngeal swab, presence of viral mutation(s) within the areas targeted by this assay, and inadequate number of viral copies(<138 copies/mL). A negative result must be combined with clinical observations, patient history, and epidemiological information. The expected result is Negative.  Fact Sheet for Patients:  BloggerCourse.com  Fact Sheet for Healthcare Providers:  SeriousBroker.it  This test is no t yet approved or cleared by the [place] and  has been authorized for detection and/or diagnosis of SARS-CoV-2 by FDA under an Emergency Use Authorization (EUA). This EUA will remain  in effect (meaning this test can be used) for the duration of the COVID-19 declaration under Section 564(b)(1) of the Act, 21 U.S.C.section 360bbb-3(b)(1), unless the authorization is terminated  or revoked sooner.       Influenza A by PCR POSITIVE (A) NEGATIVE Final   Influenza B by PCR NEGATIVE NEGATIVE Final    Comment: (NOTE) The Xpert Xpress SARS-CoV-2/FLU/RSV plus assay is intended as an aid in the diagnosis of influenza from Nasopharyngeal swab specimens and should not be used as a sole basis for treatment. Nasal washings and aspirates are unacceptable for Xpert Xpress  SARS-CoV-2/FLU/RSV testing.  Fact Sheet for Patients: BloggerCourse.com  Fact Sheet for Healthcare Providers: SeriousBroker.it  This test is not yet approved or cleared by the [place] FDA and has been authorized for detection and/or diagnosis of SARS-CoV-2 by FDA under an Emergency Use Authorization (EUA). This EUA will remain in effect (meaning this test can be used) for the duration of the COVID-19 declaration under Section 564(b)(1) of the Act, 21 U.S.C. section 360bbb-3(b)(1), unless the authorization is terminated or revoked.     Resp Syncytial Virus by PCR NEGATIVE NEGATIVE Final    Comment: (NOTE) Fact Sheet for Patients: BloggerCourse.com  Fact Sheet for Healthcare Providers: SeriousBroker.it  This test is not yet approved or cleared by the [place] FDA and has been authorized for detection and/or diagnosis of SARS-CoV-2 by FDA under an Emergency Use Authorization (EUA). This EUA will remain in effect (meaning this test can be used) for the duration of the COVID-19 declaration under Section 564(b)(1) of the Act, 21 U.S.C. section 360bbb-3(b)(1), unless the authorization is terminated or revoked.  Performed at [facility], [address] [place], [place] [address]   Blood culture (routine x 2)     Status: None (Preliminary result)   Collection Time: 12/28/21  8:14 AM   Specimen: BLOOD RIGHT HAND  Result Value Ref Range Status   Specimen Description BLOOD RIGHT HAND  Final   Special Requests   Final    BOTTLES DRAWN AEROBIC AND ANAEROBIC Blood Culture adequate volume   Culture   Final    NO GROWTH < 24 HOURS Performed at [facility], [address], [place], [place] [address]    Report Status PENDING  Incomplete  Blood culture (routine x 2)     Status: None (Preliminary result)   Collection Time: 12/28/21  8:14 AM   Specimen:  Right Antecubital; Blood  Result Value Ref Range Status   Specimen Description RIGHT ANTECUBITAL  Final   Special Requests   Final    BOTTLES DRAWN AEROBIC AND ANAEROBIC Blood Culture adequate volume   Culture   Final    NO GROWTH < 24 HOURS Performed at [facility], [address] [place], [place] [address]    Report Status PENDING  Incomplete    Labs: CBC: Recent Labs  Lab 12/28/21 0806 12/29/21 0520  WBC 7.3 7.7  NEUTROABS 4.6  --   HGB 11.8* 12.8*  HCT 36.2* 39.2  MCV 83.4 84.8  PLT 372 392   Basic Metabolic Panel: Recent Labs  Lab 12/28/21 0804 12/28/21 0806 12/29/21 0520  NA  --  135 137  K  --  2.9* 4.1  CL  --  101 103  CO2  --  23 25  GLUCOSE  --  110* 134*  BUN  --  13 10  CREATININE  --  0.81 0.80  CALCIUM  --  8.6* 8.6*  MG 1.9  --   --    Liver Function Tests: Recent Labs  Lab 12/28/21 0806  AST 30  ALT 25  ALKPHOS 53  BILITOT 0.9  PROT 7.1  ALBUMIN 3.8   CBG: No results for input(s): "GLUCAP" in the last 168 hours.  Discharge time spent: less than 30 minutes.  Signed: Hollice EspySendil K Dontrel Smethers, MD [facility] 12/29/2021

## 2021-12-29 NOTE — Assessment & Plan Note (Addendum)
Secondary to asthma exacerbation caused by flu.  By 12/26, able to ambulate on room air and feeling much better.  Sepsis ruled out

## 2021-12-29 NOTE — ED Notes (Signed)
Patient pleasant throughout shift and cooperative with treatment. Patient resting at this time. Wife at bedside. 

## 2021-12-29 NOTE — Assessment & Plan Note (Signed)
Not using CPAP.  Once he gets a primary care physician, can get it further evaluated, advised patient.

## 2022-01-02 LAB — CULTURE, BLOOD (ROUTINE X 2)
Culture: NO GROWTH
Culture: NO GROWTH
Special Requests: ADEQUATE
Special Requests: ADEQUATE

## 2022-02-22 ENCOUNTER — Other Ambulatory Visit: Payer: Self-pay

## 2022-02-22 ENCOUNTER — Encounter: Payer: Self-pay | Admitting: Intensive Care

## 2022-02-22 ENCOUNTER — Emergency Department: Payer: Self-pay

## 2022-02-22 ENCOUNTER — Emergency Department
Admission: EM | Admit: 2022-02-22 | Discharge: 2022-02-22 | Disposition: A | Discharge status: Home / Self Care | Payer: Self-pay | Attending: Student in an Organized Health Care Education/Training Program | Admitting: Student in an Organized Health Care Education/Training Program

## 2022-02-22 DIAGNOSIS — B338 Other specified viral diseases: Secondary | ICD-10-CM

## 2022-02-22 DIAGNOSIS — B974 Respiratory syncytial virus as the cause of diseases classified elsewhere: Secondary | ICD-10-CM | POA: Insufficient documentation

## 2022-02-22 DIAGNOSIS — J45901 Unspecified asthma with (acute) exacerbation: Secondary | ICD-10-CM | POA: Insufficient documentation

## 2022-02-22 DIAGNOSIS — Z1152 Encounter for screening for COVID-19: Secondary | ICD-10-CM | POA: Insufficient documentation

## 2022-02-22 LAB — RESP PANEL BY RT-PCR (RSV, FLU A&B, COVID)  RVPGX2
Influenza A by PCR: NEGATIVE
Influenza B by PCR: NEGATIVE
Resp Syncytial Virus by PCR: POSITIVE — AB
SARS Coronavirus 2 by RT PCR: NEGATIVE

## 2022-02-22 MED ORDER — PREDNISONE 20 MG PO TABS
60.0000 mg | ORAL_TABLET | Freq: Once | ORAL | Status: AC
  Administered 2022-02-22: 60 mg via ORAL
  Filled 2022-02-22: qty 3

## 2022-02-22 MED ORDER — ALBUTEROL SULFATE HFA 108 (90 BASE) MCG/ACT IN AERS: 2.0000 | INHALATION_SPRAY | RESPIRATORY_TRACT | 1 refills | Status: DC | PRN

## 2022-02-22 MED ORDER — IPRATROPIUM-ALBUTEROL 0.5-2.5 (3) MG/3ML IN SOLN
3.0000 mL | Freq: Once | RESPIRATORY_TRACT | Status: AC
  Administered 2022-02-22: 3 mL via RESPIRATORY_TRACT
  Filled 2022-02-22: qty 3

## 2022-02-22 MED ORDER — PREDNISONE 10 MG PO TABS: 10.0000 mg | ORAL_TABLET | Freq: Every day | ORAL | 0 refills | Status: DC

## 2022-02-22 NOTE — ED Notes (Signed)
See triage note  Presents with an asthma attack  Some wheezing and SOB noted this am  No relief with inhalers

## 2022-02-22 NOTE — ED Triage Notes (Signed)
Patient presents with cough and asthma flare up. Inhalers not providing much relief at home. Reports he has a cold and it started to flare up yesterday

## 2022-02-22 NOTE — ED Provider Notes (Signed)
[facility] Provider Note    Event Date/Time   First MD Initiated Contact with Patient 02/22/22 [phone]     (approximate)   History   Asthma and Cough   HPI  Justin Reid is a 42 y.o. male Libby Maw to the ER for evaluation of of cough shortness of breath feel like his asthma is flaring up.  Started getting sick with cold-like symptoms yesterday.  Has long history of asthma only takes albuterol rescue inhaler as needed.  Denies any fevers.  Has had some congestion.  No known sick contacts.  No nausea or vomiting.     Physical Exam   Triage Vital Signs: ED Triage Vitals  Enc Vitals Group     BP 02/22/22 0836 (!) 159/103     Pulse Rate 02/22/22 0836 90     Resp 02/22/22 0836 (!) 24     Temp 02/22/22 0836 98.2 F (36.8 C)     Temp Source 02/22/22 0836 Oral     SpO2 02/22/22 0836 96 %     Weight 02/22/22 0837 (!) 307 lb (139.3 kg)     Height 02/22/22 0837 5' 11"$  (1.803 m)     Head Circumference --      Peak Flow --      Pain Score 02/22/22 0837 0     Pain Loc --      Pain Edu? --      Excl. in [place]? --     Most recent vital signs: Vitals:   02/22/22 0836  BP: (!) 159/103  Pulse: 90  Resp: (!) 24  Temp: 98.2 F (36.8 C)  SpO2: 96%     Constitutional: Alert  Eyes: Conjunctivae are normal.  Head: Atraumatic. Nose: + congestion/rhinnorhea. Mouth/Throat: Mucous membranes are moist.   Neck: Painless ROM.  Cardiovascular:   Good peripheral circulation. Respiratory: Diffuse expiratory wheeze.  No respiratory distress. Gastrointestinal: Soft and nontender.  Musculoskeletal:  no deformity Neurologic:  MAE spontaneously. No gross focal neurologic deficits are appreciated.  Skin:  Skin is warm, dry and intact. No rash noted. Psychiatric: Mood and affect are normal. Speech and behavior are normal.    ED Results / Procedures / Treatments   Labs (all labs ordered are listed, but only abnormal results are displayed) Labs Reviewed  RESP PANEL  BY RT-PCR (RSV, FLU A&B, COVID)  [id number] - Abnormal; Notable for the following components:      Result Value   Resp Syncytial Virus by PCR POSITIVE (*)    All other components within normal limits     EKG     RADIOLOGY Please see ED Course for my review and interpretation.  I personally reviewed all radiographic images ordered to evaluate for the above acute complaints and reviewed radiology reports and findings.  These findings were personally discussed with the patient.  Please see medical record for radiology report.    PROCEDURES:  Critical Care performed: No  Procedures   MEDICATIONS ORDERED IN ED: Medications  ipratropium-albuterol (DUONEB) 0.5-2.5 (3) MG/3ML nebulizer solution 3 mL (3 mLs Nebulization Given 02/22/22 0855)  predniSONE (DELTASONE) tablet 60 mg (60 mg Oral Given 02/22/22 0853)  ipratropium-albuterol (DUONEB) 0.5-2.5 (3) MG/3ML nebulizer solution 3 mL (3 mLs Nebulization Given 02/22/22 0950)     IMPRESSION / MDM / ASSESSMENT AND PLAN / ED COURSE  I reviewed the triage vital signs and the nursing notes.  Differential diagnosis includes, but is not limited to, asthma, COPD, bronchitis, COVID, flu, pneumonia, CHF  Patient presented to ER for evaluation of shortness of breath cough congestion as described above.  He is nontoxic-appearing.  His exam is consistent with asthma or bronchitis.  Chest x-ray will be ordered.  Is not consistent with CHF not consistent with ACS.  He is not septic.  Will give prednisone.   Clinical Course as of 02/22/22 1018  Mon Feb 22, 2022  0932 Chest x-ray my review and interpretation without evidence of consolidation. [PR]  [id number] Patient reassessed.  Significant improvement.  Will give additional nebulizer [PR]  0933 . [PR]  1014 Patient reassessed.  He is RSV positive.  He is not hypoxic.  Felt significant improvement after nebulizers and steroid.  Do not feel that further observation clinically  indicated.  Do not feel that further diagnostic testing indicated.  Patient feels comfortable managing this at home.  He has albuterol at home will be provided prescription for steroid discussed signs and symptoms for which she should return to the ER. [PR]    Clinical Course User Index [PR] Merlyn Lot, MD     FINAL CLINICAL IMPRESSION(S) / ED DIAGNOSES   Final diagnoses:  Exacerbation of asthma, unspecified asthma severity, unspecified whether persistent  RSV (respiratory syncytial virus infection)     Rx / DC Orders   ED Discharge Orders          Ordered    predniSONE (DELTASONE) 10 MG tablet  Daily        02/22/22 1018    albuterol (VENTOLIN HFA) 108 (90 Base) MCG/ACT inhaler  Every 4 hours PRN        02/22/22 1018             Note:  This document was prepared using Dragon voice recognition software and may include unintentional dictation errors.    Merlyn Lot, MD 02/22/22 1018

## 2022-04-13 ENCOUNTER — Encounter: Payer: Self-pay | Admitting: Emergency Medicine

## 2022-04-13 ENCOUNTER — Emergency Department: Payer: Self-pay

## 2022-04-13 ENCOUNTER — Other Ambulatory Visit: Payer: Self-pay

## 2022-04-13 ENCOUNTER — Emergency Department
Admission: EM | Admit: 2022-04-13 | Discharge: 2022-04-13 | Disposition: A | Discharge status: Home / Self Care | Payer: Self-pay | Attending: Emergency Medicine | Admitting: Emergency Medicine

## 2022-04-13 ENCOUNTER — Other Ambulatory Visit (HOSPITAL_COMMUNITY): Payer: Self-pay

## 2022-04-13 DIAGNOSIS — M545 Low back pain, unspecified: Secondary | ICD-10-CM | POA: Insufficient documentation

## 2022-04-13 DIAGNOSIS — J45909 Unspecified asthma, uncomplicated: Secondary | ICD-10-CM | POA: Insufficient documentation

## 2022-04-13 LAB — CBC WITH DIFFERENTIAL/PLATELET
Abs Immature Granulocytes: 0.02 10*3/uL (ref 0.00–0.07)
Basophils Absolute: 0 10*3/uL (ref 0.0–0.1)
Basophils Relative: 1 %
Eosinophils Absolute: 0.4 10*3/uL (ref 0.0–0.5)
Eosinophils Relative: 5 %
HCT: 41.9 % (ref 39.0–52.0)
Hemoglobin: 13.5 g/dL (ref 13.0–17.0)
Immature Granulocytes: 0 %
Lymphocytes Relative: 29 %
Lymphs Abs: 2.3 10*3/uL (ref 0.7–4.0)
MCH: 28.1 pg (ref 26.0–34.0)
MCHC: 32.2 g/dL (ref 30.0–36.0)
MCV: 87.1 fL (ref 80.0–100.0)
Monocytes Absolute: 0.4 10*3/uL (ref 0.1–1.0)
Monocytes Relative: 5 %
Neutro Abs: 4.6 10*3/uL (ref 1.7–7.7)
Neutrophils Relative %: 60 %
Platelets: 422 10*3/uL — ABNORMAL HIGH (ref 150–400)
RBC: 4.81 MIL/uL (ref 4.22–5.81)
RDW: 13.8 % (ref 11.5–15.5)
WBC: 7.7 10*3/uL (ref 4.0–10.5)
nRBC: 0 % (ref 0.0–0.2)

## 2022-04-13 LAB — BASIC METABOLIC PANEL
Anion gap: 7 (ref 5–15)
BUN: 18 mg/dL (ref 6–20)
CO2: 21 mmol/L — ABNORMAL LOW (ref 22–32)
Calcium: 8.4 mg/dL — ABNORMAL LOW (ref 8.9–10.3)
Chloride: 106 mmol/L (ref 98–111)
Creatinine, Ser: 0.88 mg/dL (ref 0.61–1.24)
GFR, Estimated: >60 mL/min (ref >60)
Glucose, Bld: 106 mg/dL — ABNORMAL HIGH (ref 70–99)
Potassium: 3.7 mmol/L (ref 3.5–5.1)
Sodium: 134 mmol/L — ABNORMAL LOW (ref 135–145)

## 2022-04-13 MED ORDER — METHOCARBAMOL 500 MG PO TABS
500.0000 mg | ORAL_TABLET | Freq: Three times a day (TID) | ORAL | 1 refills | Status: DC | PRN
  Filled 2022-04-13: qty 20, 7d supply, fill #0

## 2022-04-13 MED ORDER — IPRATROPIUM-ALBUTEROL 0.5-2.5 (3) MG/3ML IN SOLN
3.0000 mL | Freq: Once | RESPIRATORY_TRACT | Status: AC
  Administered 2022-04-13: 3 mL via RESPIRATORY_TRACT
  Filled 2022-04-13: qty 3

## 2022-04-13 MED ORDER — TRAMADOL HCL 50 MG PO TABS
50.0000 mg | ORAL_TABLET | Freq: Four times a day (QID) | ORAL | 0 refills | Status: DC | PRN
  Filled 2022-04-13: qty 20, 5d supply, fill #0

## 2022-04-13 MED ORDER — ALBUTEROL SULFATE HFA 108 (90 BASE) MCG/ACT IN AERS
2.0000 | INHALATION_SPRAY | Freq: Four times a day (QID) | RESPIRATORY_TRACT | 2 refills | Status: DC | PRN
  Filled 2022-04-13: qty 6.7, 25d supply, fill #0

## 2022-04-13 MED ORDER — KETOROLAC TROMETHAMINE 30 MG/ML IJ SOLN
30.0000 mg | Freq: Once | INTRAMUSCULAR | Status: AC
  Administered 2022-04-13: 30 mg via INTRAMUSCULAR
  Filled 2022-04-13: qty 1

## 2022-04-13 NOTE — ED Triage Notes (Signed)
Pt to ED via POV c/o right flank pain that started yesterday morning. Pt reports that he is also having an asthma flare. Pt reports that he ran out of his inhaler. Pt has audible wheezing in triage. Pt denies N/V/, or urinary symptoms. Pt is currently in NAD.

## 2022-04-13 NOTE — ED Provider Notes (Signed)
[facility] Provider Note    Event Date/Time   First MD Initiated Contact with Patient 04/13/22 [phone]     (approximate)   History   Asthma and Flank Pain   HPI  Justin Reid is a 42 y.o. male with history of asthma who presents with complaints of right back pain.  It does not radiate into the abdomen.  He reports it started yesterday, made it difficult for him to sleep last night, reports the pain is moderate to severe.  Reports a distant history of a kidney stone.  No hematuria or dysuria noted.  No neurodeficits, no IV drug abuse, no fevers     Physical Exam   Triage Vital Signs: ED Triage Vitals  Enc Vitals Group     BP 04/13/22 0726 (!) 141/105     Pulse Rate 04/13/22 0726 82     Resp 04/13/22 0726 16     Temp 04/13/22 0726 98.6 F (37 C)     Temp Source 04/13/22 0726 Oral     SpO2 04/13/22 0726 95 %     Weight 04/13/22 0723 136.1 kg (300 lb)     Height 04/13/22 0723 1.803 m (5\' 11" )     Head Circumference --      Peak Flow --      Pain Score 04/13/22 0723 10     Pain Loc --      Pain Edu? --      Excl. in [place]? --     Most recent vital signs: Vitals:   04/13/22 0726  BP: (!) 141/105  Pulse: 82  Resp: 16  Temp: 98.6 F (37 C)  SpO2: 95%     General: Awake, no distress.  CV:  Good peripheral perfusion.  Resp:  Normal effort.  Abd:  No distention.  Mild right CVA tenderness, otherwise reassuring abdominal exam Other:  No vertebral tenderness to palpation, normal strength in the lower extremities   ED Results / Procedures / Treatments   Labs (all labs ordered are listed, but only abnormal results are displayed) Labs Reviewed  CBC WITH DIFFERENTIAL/PLATELET - Abnormal; Notable for the following components:      Result Value   Platelets 422 (*)    All other components within normal limits  BASIC METABOLIC PANEL - Abnormal; Notable for the following components:   Sodium 134 (*)    CO2 21 (*)    Glucose, Bld 106 (*)     Calcium 8.4 (*)    All other components within normal limits  URINALYSIS, ROUTINE W REFLEX MICROSCOPIC     EKG     RADIOLOGY CT renal stone study viewed interpreted by me, no ureterolithiasis, confirmed by radiology    PROCEDURES:  Critical Care performed:   Procedures   MEDICATIONS ORDERED IN ED: Medications  ketorolac (TORADOL) 30 MG/ML injection 30 mg (30 mg Intramuscular Given 04/13/22 0803)  ipratropium-albuterol (DUONEB) 0.5-2.5 (3) MG/3ML nebulizer solution 3 mL (3 mLs Nebulization Given 04/13/22 0753)  ipratropium-albuterol (DUONEB) 0.5-2.5 (3) MG/3ML nebulizer solution 3 mL (3 mLs Nebulization Given 04/13/22 0754)     IMPRESSION / MDM / ASSESSMENT AND PLAN / ED COURSE  I reviewed the triage vital signs and the nursing notes. Patient's presentation is most consistent with severe exacerbation of chronic illness.  Patient presents with shortness of breath related to asthma, he is wheezing and has been out of his inhaler.  Will treat with DuoNebs  Also here for right back pain, differential includes  musculoskeletal pain versus ureterolithiasis less likely UTI.   will treat with IM Toradol obtain CT renal stone study and reevaluate.  Patient CT scan negative for kidney stone, which makes musculoskeletal back pain far more likely.  No dysuria.  Will start the patient on muscle relaxers, analgesics, outpatient follow-up recommended, return precautions discussed      FINAL CLINICAL IMPRESSION(S) / ED DIAGNOSES   Final diagnoses:  Acute right-sided low back pain without sciatica     Rx / DC Orders   ED Discharge Orders          Ordered    traMADol (ULTRAM) 50 MG tablet  Every 6 hours PRN        04/13/22 0820    methocarbamol (ROBAXIN) 500 MG tablet  Every 8 hours PRN        04/13/22 0820    albuterol (VENTOLIN HFA) 108 (90 Base) MCG/ACT inhaler  Every 6 hours PRN        04/13/22 0820             Note:  This document was prepared using Dragon voice  recognition software and may include unintentional dictation errors.   Jene Every, MD 04/13/22 [phone]

## 2022-04-16 ENCOUNTER — Encounter: Payer: Self-pay | Admitting: Emergency Medicine

## 2022-04-16 ENCOUNTER — Other Ambulatory Visit: Payer: Self-pay

## 2022-04-16 ENCOUNTER — Emergency Department
Admission: EM | Admit: 2022-04-16 | Discharge: 2022-04-16 | Disposition: A | Discharge status: Home / Self Care | Payer: Self-pay | Attending: Emergency Medicine | Admitting: Emergency Medicine

## 2022-04-16 DIAGNOSIS — B029 Zoster without complications: Secondary | ICD-10-CM | POA: Insufficient documentation

## 2022-04-16 MED ORDER — VALACYCLOVIR HCL 1 G PO TABS: 1000.0000 mg | ORAL_TABLET | Freq: Three times a day (TID) | ORAL | 0 refills | Status: DC

## 2022-04-16 MED ORDER — LIDOCAINE 5 % EX OINT: 1.0000 | TOPICAL_OINTMENT | CUTANEOUS | 0 refills | Status: DC | PRN

## 2022-04-16 NOTE — ED Provider Notes (Signed)
   [facility] Provider Note    Event Date/Time   First MD Initiated Contact with Patient 04/16/22 1333     (approximate)   History   Rash   HPI  Justin Reid is a 42 y.o. male  with no significant PMH and as listed in EMR presents to the emergency department for treatment and evaluation of rash to the right side of his stomach that wraps around to his back. Symptoms x 2 days.      Physical Exam   Triage Vital Signs: ED Triage Vitals  Enc Vitals Group     BP 04/16/22 1303 (!) 150/111     Pulse Rate 04/16/22 1303 90     Resp 04/16/22 1303 18     Temp 04/16/22 1303 98.2 F (36.8 C)     Temp Source 04/16/22 1303 Oral     SpO2 04/16/22 1303 95 %     Weight 04/16/22 1304 300 lb (136.1 kg)     Height 04/16/22 1304 5\' 9"  (1.753 m)     Head Circumference --      Peak Flow --      Pain Score 04/16/22 1304 10     Pain Loc --      Pain Edu? --      Excl. in [place]? --     Most recent vital signs: Vitals:   04/16/22 1303  BP: (!) 150/111  Pulse: 90  Resp: 18  Temp: 98.2 F (36.8 C)  SpO2: 95%    General: Awake, no distress.  CV:  Good peripheral perfusion.  Resp:  Normal effort.  Abd:  No distention.  Other:  Vesicular rash on erythematous base to the right abdomen that extends to the right side of the back but does not cross midline.   ED Results / Procedures / Treatments   Labs (all labs ordered are listed, but only abnormal results are displayed) Labs Reviewed - No data to display   EKG  Not indicated   RADIOLOGY  Not indicated  PROCEDURES:  Critical Care performed: No  Procedures   MEDICATIONS ORDERED IN ED:  Medications - No data to display   IMPRESSION / MDM / ASSESSMENT AND PLAN / ED COURSE   I have reviewed the triage note.  Differential diagnosis includes, but is not limited to, contact dermatitis, shingles, allergic reaction  Patient's presentation is most consistent with acute illness / injury with  system symptoms.  42 year old male presents to the emergency department for treatment and evaluation of rash that developed on his right abdomen and right back that started 2 days ago consistent in appearance with shingles.  Plan will be to treat with valacyclovir and lidocaine gel.      FINAL CLINICAL IMPRESSION(S) / ED DIAGNOSES   Final diagnoses:  Herpes zoster without complication     Rx / DC Orders   ED Discharge Orders          Ordered    valACYclovir (VALTREX) 1000 MG tablet  3 times daily        04/16/22 1444    lidocaine (XYLOCAINE) 5 % ointment  As needed        04/16/22 1444             Note:  This document was prepared using Dragon voice recognition software and may include unintentional dictation errors.   Chinita Pester, FNP 04/16/22 1528    Sharyn Creamer, MD 04/17/22 1850

## 2022-04-16 NOTE — ED Triage Notes (Signed)
Pt with right side rash that started yesterday. Rash is similar to shingles.

## 2022-04-27 ENCOUNTER — Other Ambulatory Visit: Payer: Self-pay

## 2022-05-13 ENCOUNTER — Encounter: Payer: Self-pay | Admitting: Intensive Care

## 2022-05-13 ENCOUNTER — Emergency Department
Admission: EM | Admit: 2022-05-13 | Discharge: 2022-05-13 | Disposition: A | Discharge status: Home / Self Care | Payer: Self-pay | Attending: Emergency Medicine | Admitting: Emergency Medicine

## 2022-05-13 ENCOUNTER — Other Ambulatory Visit: Payer: Self-pay

## 2022-05-13 DIAGNOSIS — B0223 Postherpetic polyneuropathy: Secondary | ICD-10-CM | POA: Insufficient documentation

## 2022-05-13 DIAGNOSIS — R030 Elevated blood-pressure reading, without diagnosis of hypertension: Secondary | ICD-10-CM

## 2022-05-13 DIAGNOSIS — J45909 Unspecified asthma, uncomplicated: Secondary | ICD-10-CM | POA: Insufficient documentation

## 2022-05-13 HISTORY — DX: Zoster without complications: B02.9

## 2022-05-13 MED ORDER — GABAPENTIN 100 MG PO CAPS: ORAL_CAPSULE | ORAL | 0 refills | Status: DC

## 2022-05-13 MED ORDER — HYDROCODONE-ACETAMINOPHEN 5-325 MG PO TABS: 1.0000 | ORAL_TABLET | Freq: Four times a day (QID) | ORAL | 0 refills | Status: DC | PRN

## 2022-05-13 MED ORDER — OXYCODONE HCL 5 MG PO TABS
5.0000 mg | ORAL_TABLET | Freq: Once | ORAL | Status: AC
  Administered 2022-05-13: 5 mg via ORAL
  Filled 2022-05-13: qty 1

## 2022-05-13 NOTE — ED Triage Notes (Signed)
Patient c/o right side pain from shingles. Diagnosed with shingles 04/16/22 and given medication

## 2022-05-13 NOTE — ED Provider Notes (Signed)
[facility] Provider Note    Event Date/Time   First MD Initiated Contact with Patient 05/13/22 1012     (approximate)   History   Flank Pain   HPI  Justin Reid is a 42 y.o. male presents to the ED with complaint of right-sided pain that continues from shingles that he was diagnosed with on 04/16/2022.  Patient states he has taken the entire course of Valtrex and that the lidocaine jelly did not help with his pain.  Patient reports that the pain is worse when he is trying to sleep at night.  He has a history of sleep apnea, hyperkalemia, thoracic back pain, hyponatremia, hypokalemia, asthma.     Physical Exam   Triage Vital Signs: ED Triage Vitals  Enc Vitals Group     BP 05/13/22 1001 (!) 134/104     Pulse Rate 05/13/22 1001 71     Resp 05/13/22 1001 18     Temp 05/13/22 1001 98.3 F (36.8 C)     Temp Source 05/13/22 1001 Oral     SpO2 05/13/22 1001 94 %     Weight 05/13/22 1004 295 lb (133.8 kg)     Height 05/13/22 1004 5\' 9"  (1.753 m)     Head Circumference --      Peak Flow --      Pain Score 05/13/22 1003 10     Pain Loc --      Pain Edu? --      Excl. in GC? --     Most recent vital signs: Vitals:   05/13/22 1001 05/13/22 1155  BP: (!) 134/104 (!) 124/98  Pulse: 71 78  Resp: 18 19  Temp: 98.3 F (36.8 C)   SpO2: 94% 99%     General: Awake, no distress.  CV:  Good peripheral perfusion.  Heart regular rate and rhythm. Resp:  Normal effort.  Lungs are clear. Abd:  No distention.  Other:  Right posterior back, lateral chest and anterior chest with healing herpes zoster with no open areas.  No drainage, no erythema, no cellulitis present.  Area is tender to light touch along the area in conjunction with his herpes zoster.   ED Results / Procedures / Treatments   Labs (all labs ordered are listed, but only abnormal results are displayed) Labs Reviewed - No data to display    PROCEDURES:  Critical Care performed:    Procedures   MEDICATIONS ORDERED IN ED: Medications  oxyCODONE (Oxy IR/ROXICODONE) immediate release tablet 5 mg (5 mg Oral Given 05/13/22 1116)     IMPRESSION / MDM / ASSESSMENT AND PLAN / ED COURSE  I reviewed the triage vital signs and the nursing notes.   Differential diagnosis includes, but is not limited to, postherpetic pain, neuropathy, cellulitis.  42 year old male presents to the ED with complaint of continued postherpetic pain.  Patient was seen in the emergency department and diagnosed with herpes zoster on 04/16/2022.  He has taken the prescription for Valtrex.  We discussed use of gabapentin to help with his postherpetic pain and the importance of obtaining a primary care provider.  Patient was given oxycodone 5 mg while in the ED.  His prescription for gabapentin 100 mg 1 at bedtime for the next 3 days and then increase to 1 tablet twice a day along with hydrocodone was sent to his pharmacy.  Patient strongly encouraged to call the numbers listed on his discharge papers and the importance of obtaining a PCP  to reevaluate his blood pressure.      Patient's presentation is most consistent with acute illness / injury with system symptoms.  FINAL CLINICAL IMPRESSION(S) / ED DIAGNOSES   Final diagnoses:  Shingles (herpes zoster) polyneuropathy  Elevated blood pressure reading     Rx / DC Orders   ED Discharge Orders          Ordered    HYDROcodone-acetaminophen (NORCO/VICODIN) 5-325 MG tablet  Every 6 hours PRN        05/13/22 1117    gabapentin (NEURONTIN) 100 MG capsule        05/13/22 1117             Note:  This document was prepared using Dragon voice recognition software and may include unintentional dictation errors.   Tommi Rumps, PA-C 05/13/22 1255    Concha Se, MD 05/14/22 [phone]

## 2022-05-13 NOTE — Discharge Instructions (Addendum)
Call the clinics listed above to see if you can get an appointment.  You may need a primary care provider for your future needs.  It may be several weeks before you can get an appointment as you will be considered a new patient.  Another list of clinics that are taking new patients is listed on your discharge papers as well.  Prescriptions for gabapentin and hydrocodone was sent to the pharmacy for you to begin taking as directed.  The gabapentin is for the nerve pain from the shingles that you have had.  Read the discharge papers about postherpetic neuropathy for further information.  Please go to the following website to schedule new (and existing) patient appointments:   [email or web address]   The following is a list of primary care offices in the area who are accepting new patients at this time.  Please reach out to one of them directly and let them know you would like to schedule an appointment to follow up on an Emergency Department visit, and/or to establish a new primary care provider (PCP).  There are likely other primary care clinics in the are who are accepting new patients, but this is an excellent place to start:  [facility] Lead physician: Dr Shirlee Latch [address] [place], [place] [address] [phone]  [facility] Lead Physician: Dr Alba Cory [address], [place], [place] [address] [phone]  [facility]  Lead Physician: Dr Olevia Perches [address] [place], [place] [address] [phone]  [facility] Lead Physician: Dr Sofie Hartigan [address], [place], [place] [address] [phone]  [facility] at [facility] Lead Physician: Dr Bari Edward [address] [place], [place], [place] [address] [phone]

## 2022-05-26 ENCOUNTER — Emergency Department: Payer: Self-pay

## 2022-05-26 ENCOUNTER — Other Ambulatory Visit: Payer: Self-pay

## 2022-05-26 ENCOUNTER — Emergency Department
Admission: EM | Admit: 2022-05-26 | Discharge: 2022-05-26 | Disposition: A | Discharge status: Home / Self Care | Payer: Self-pay | Attending: Emergency Medicine | Admitting: Emergency Medicine

## 2022-05-26 DIAGNOSIS — R0602 Shortness of breath: Secondary | ICD-10-CM

## 2022-05-26 DIAGNOSIS — J4531 Mild persistent asthma with (acute) exacerbation: Secondary | ICD-10-CM | POA: Insufficient documentation

## 2022-05-26 DIAGNOSIS — J45901 Unspecified asthma with (acute) exacerbation: Secondary | ICD-10-CM

## 2022-05-26 DIAGNOSIS — I10 Essential (primary) hypertension: Secondary | ICD-10-CM | POA: Insufficient documentation

## 2022-05-26 LAB — BASIC METABOLIC PANEL
Anion gap: 8 (ref 5–15)
BUN: 19 mg/dL (ref 6–20)
CO2: 22 mmol/L (ref 22–32)
Calcium: 8.7 mg/dL — ABNORMAL LOW (ref 8.9–10.3)
Chloride: 106 mmol/L (ref 98–111)
Creatinine, Ser: 0.93 mg/dL (ref 0.61–1.24)
GFR, Estimated: >60 mL/min (ref >60)
Glucose, Bld: 123 mg/dL — ABNORMAL HIGH (ref 70–99)
Potassium: 3.7 mmol/L (ref 3.5–5.1)
Sodium: 136 mmol/L (ref 135–145)

## 2022-05-26 LAB — CBC
HCT: 40.3 % (ref 39.0–52.0)
Hemoglobin: 12.6 g/dL — ABNORMAL LOW (ref 13.0–17.0)
MCH: 27.5 pg (ref 26.0–34.0)
MCHC: 31.3 g/dL (ref 30.0–36.0)
MCV: 87.8 fL (ref 80.0–100.0)
Platelets: 392 10*3/uL (ref 150–400)
RBC: 4.59 MIL/uL (ref 4.22–5.81)
RDW: 13.9 % (ref 11.5–15.5)
WBC: 7.7 10*3/uL (ref 4.0–10.5)
nRBC: 0 % (ref 0.0–0.2)

## 2022-05-26 MED ORDER — IPRATROPIUM-ALBUTEROL 0.5-2.5 (3) MG/3ML IN SOLN
3.0000 mL | Freq: Once | RESPIRATORY_TRACT | Status: AC
  Administered 2022-05-26: 3 mL via RESPIRATORY_TRACT
  Filled 2022-05-26: qty 3

## 2022-05-26 MED ORDER — PREDNISONE 20 MG PO TABS
40.0000 mg | ORAL_TABLET | Freq: Every day | ORAL | 0 refills | Status: AC
  Filled 2022-05-26: qty 10, 5d supply, fill #0

## 2022-05-26 MED ORDER — PREDNISONE 20 MG PO TABS: 40.0000 mg | ORAL_TABLET | Freq: Every day | ORAL | 0 refills | Status: DC

## 2022-05-26 MED ORDER — PREDNISONE 20 MG PO TABS
60.0000 mg | ORAL_TABLET | Freq: Once | ORAL | Status: AC
  Administered 2022-05-26: 60 mg via ORAL
  Filled 2022-05-26: qty 3

## 2022-05-26 MED ORDER — ALBUTEROL SULFATE HFA 108 (90 BASE) MCG/ACT IN AERS
2.0000 | INHALATION_SPRAY | Freq: Four times a day (QID) | RESPIRATORY_TRACT | 2 refills | Status: DC | PRN
  Filled 2022-05-26: qty 6.7, 25d supply, fill #0

## 2022-05-26 MED ORDER — ALBUTEROL SULFATE (2.5 MG/3ML) 0.083% IN NEBU
3.0000 mL | INHALATION_SOLUTION | Freq: Once | RESPIRATORY_TRACT | Status: AC
  Administered 2022-05-26: 3 mL via RESPIRATORY_TRACT
  Filled 2022-05-26: qty 3

## 2022-05-26 MED ORDER — ALBUTEROL SULFATE HFA 108 (90 BASE) MCG/ACT IN AERS: 2.0000 | INHALATION_SPRAY | Freq: Four times a day (QID) | RESPIRATORY_TRACT | 2 refills | Status: DC | PRN

## 2022-05-26 NOTE — ED Notes (Signed)
Breathing Tx completed. Exp. Wheezes still present. Patients states tightness a little better in chest. Up to the Bathroom.

## 2022-05-26 NOTE — ED Notes (Signed)
States Asthma flared up last night. Has no money to get inhaler. Has exp wheezes. Alert and oriented. Not in distress.

## 2022-05-26 NOTE — ED Triage Notes (Signed)
Pt to ED for shob started last night. Reports ran out of asthma inhalers last night. Wheezing noted. Cough noted.

## 2022-05-26 NOTE — ED Provider Notes (Signed)
[facility] Emergency Dept Provider Note    Event Date/Time   First MD Initiated Contact with Patient 05/26/22 [phone]     (approximate)  History   Chief Complaint: Shortness of Breath  HPI  Justin Reid is a 42 y.o. male with a past medical history of asthma, hypertension, presents to the emergency department for shortness of breath.  According to the patient he has a history of asthma, recently ran out of his albuterol medication, states he has a prescription but cannot afford the inhaler.  He states yesterday he was feeling more short of breath which worsened overnight.  Upon arrival patient with audible wheeze.  Denies any chest pain denies any recent illnesses.  No fever.  Physical Exam   Triage Vital Signs: ED Triage Vitals [05/26/22 0738]  Enc Vitals Group     BP (!) 137/104     Pulse Rate (!) 102     Resp 20     Temp 97.9 F (36.6 C)     Temp src      SpO2 97 %     Weight 250 lb (113.4 kg)     Height 5\' 11"  (1.803 m)     Head Circumference      Peak Flow      Pain Score 0     Pain Loc      Pain Edu?      Excl. in GC?     Most recent vital signs: Vitals:   05/26/22 0738  BP: (!) 137/104  Pulse: (!) 102  Resp: 20  Temp: 97.9 F (36.6 C)  SpO2: 97%    General: Awake, no distress.  CV:  Good peripheral perfusion.  Regular rate and rhythm around 100 bpm Resp:  Mild tachypnea with moderate expiratory wheeze bilaterally.  No rhonchi. Abd:  No distention.  Soft, nontender.  No rebound or guarding.  ED Results / Procedures / Treatments   EKG  EKG viewed and interpreted by myself shows a normal sinus rhythm at 95 bpm with a narrow QRS, left axis deviation, largely normal intervals with nonspecific ST changes.  No ST elevation.  RADIOLOGY  Chest x-ray reviewed and interpreted by myself.  No consolidation seen on my evaluation. Radiology is read the chest x-ray is clear   MEDICATIONS ORDERED IN ED: Medications  ipratropium-albuterol (DUONEB)  0.5-2.5 (3) MG/3ML nebulizer solution 3 mL (has no administration in time range)  ipratropium-albuterol (DUONEB) 0.5-2.5 (3) MG/3ML nebulizer solution 3 mL (has no administration in time range)  predniSONE (DELTASONE) tablet 60 mg (has no administration in time range)  albuterol (PROVENTIL) (2.5 MG/3ML) 0.083% nebulizer solution 3 mL (has no administration in time range)  ipratropium-albuterol (DUONEB) 0.5-2.5 (3) MG/3ML nebulizer solution 3 mL (3 mLs Nebulization Given 05/26/22 0746)     IMPRESSION / MDM / ASSESSMENT AND PLAN / ED COURSE  I reviewed the triage vital signs and the nursing notes.  Patient's presentation is most consistent with acute presentation with potential threat to life or bodily function.  Patient presents emergency department for shortness of breath.  Patient states this feels like his asthma.  Patient has moderate expiratory wheeze consistent with asthma.  Lab work reassuring with a normal CBC reassuring chemistry chest x-ray is clear and EKG normal.  Will dose 2 breathing treatments in the emergency department and dose 60 mg of oral prednisone.  Patient is out of his albuterol inhaler we will attempt to obtain an inhaler from pharmacy to be discharged with the  patient so he has access to albuterol until he is able to fill his prescription.  Patient reassuringly satting in the upper 90s 97% on room air.  Will continue to closely monitor and reassess after breathing treatments.  Patient agreeable to plan of care.  Patient is feeling much better after breathing treatments and steroids.  He has no complaints at this time.  We will discharge the patient with outpatient follow-up.  FINAL CLINICAL IMPRESSION(S) / ED DIAGNOSES   Asthma exacerbation  Rx / DC Orders   Albuterol Prednisone  Note:  This document was prepared using Dragon voice recognition software and may include unintentional dictation errors.   Minna Antis, MD 05/26/22 1120

## 2022-05-31 ENCOUNTER — Encounter: Payer: Self-pay | Admitting: Emergency Medicine

## 2022-05-31 ENCOUNTER — Other Ambulatory Visit: Payer: Self-pay

## 2022-05-31 ENCOUNTER — Emergency Department
Admission: EM | Admit: 2022-05-31 | Discharge: 2022-05-31 | Disposition: A | Discharge status: Home / Self Care | Payer: Self-pay | Attending: Emergency Medicine | Admitting: Emergency Medicine

## 2022-05-31 DIAGNOSIS — M109 Gout, unspecified: Secondary | ICD-10-CM | POA: Insufficient documentation

## 2022-05-31 MED ORDER — HYDROCODONE-ACETAMINOPHEN 5-325 MG PO TABS: 1.0000 | ORAL_TABLET | ORAL | 0 refills | Status: DC | PRN

## 2022-05-31 MED ORDER — HYDROCODONE-ACETAMINOPHEN 5-325 MG PO TABS
1.0000 | ORAL_TABLET | Freq: Once | ORAL | Status: AC
  Administered 2022-05-31: 1 via ORAL
  Filled 2022-05-31: qty 1

## 2022-05-31 MED ORDER — COLCHICINE 0.6 MG PO TABS: 0.6000 mg | ORAL_TABLET | Freq: Every day | ORAL | 0 refills | Status: DC

## 2022-05-31 MED ORDER — COLCHICINE 0.6 MG PO TABS
1.8000 mg | ORAL_TABLET | Freq: Once | ORAL | Status: AC
  Administered 2022-05-31: 1.8 mg via ORAL
  Filled 2022-05-31: qty 3

## 2022-05-31 NOTE — ED Provider Notes (Signed)
[facility] Provider Note  Patient Contact: 4:56 PM (approximate)   History   Foot Pain   HPI  Justin Reid is a 42 y.o. male who presents the emergency department complaining of pain in the MTP joint of the great toe left foot.  Patient has a history of gout in the same location that occurred 2 years ago.  Similar symptoms in pain.  No injury to the area.  No other complaints.  Patient has not tried medication prior to arrival.     Physical Exam   Triage Vital Signs: ED Triage Vitals  Enc Vitals Group     BP 05/31/22 1624 (!) 144/104     Pulse Rate 05/31/22 1624 100     Resp 05/31/22 1624 18     Temp 05/31/22 1624 98.4 F (36.9 C)     Temp Source 05/31/22 1624 Oral     SpO2 05/31/22 1624 96 %     Weight --      Height --      Head Circumference --      Peak Flow --      Pain Score 05/31/22 1623 8     Pain Loc --      Pain Edu? --      Excl. in GC? --     Most recent vital signs: Vitals:   05/31/22 1624  BP: (!) 144/104  Pulse: 100  Resp: 18  Temp: 98.4 F (36.9 C)  SpO2: 96%     General: Alert and in no acute distress.  Cardiovascular:  Good peripheral perfusion Respiratory: Normal respiratory effort without tachypnea or retractions. Lungs CTAB.  Musculoskeletal: Full range of motion to all extremities.  Visualization of the left foot reveals edema, mild erythema to the MTP joint of the great toe.  Area is tender to palpation with mild warmth.  Findings are consistent with gout.  No other skin changes of the foot to be concern for cellulitis.  No other visible signs of abnormality about the foot or ankle.  Pulses sensation intact. Neurologic:  No gross focal neurologic deficits are appreciated.  Skin:   No rash noted Other:   ED Results / Procedures / Treatments   Labs (all labs ordered are listed, but only abnormal results are displayed) Labs Reviewed - No data to display   EKG     RADIOLOGY    No results  found.  PROCEDURES:  Critical Care performed: No  Procedures   MEDICATIONS ORDERED IN ED: Medications  colchicine tablet 1.8 mg (has no administration in time range)  HYDROcodone-acetaminophen (NORCO/VICODIN) 5-325 MG per tablet 1 tablet (has no administration in time range)     IMPRESSION / MDM / ASSESSMENT AND PLAN / ED COURSE  I reviewed the triage vital signs and the nursing notes.                                 Differential diagnosis includes, but is not limited to, toe fracture, gout, cellulitis, pseudogout   Patient's presentation is most consistent with acute presentation with potential threat to life or bodily function.   Patient's diagnosis is consistent with gout.  Patient presents emergency department complaining of pain about the MTP joint of the great toe left foot.  History of gout with similar symptoms.  Findings on exam are most consistent with gout..  Colchicine started here.  Prescription for same will be provided  to the patient.  Concerning signs and symptoms and return precautions discussed with the patient and his wife.  Follow-up primary care as needed.  Patient is given ED precautions to return to the ED for any worsening or new symptoms.     FINAL CLINICAL IMPRESSION(S) / ED DIAGNOSES   Final diagnoses:  Acute gout involving toe of left foot, unspecified cause     Rx / DC Orders   ED Discharge Orders          Ordered    colchicine 0.6 MG tablet  Daily        05/31/22 1711    HYDROcodone-acetaminophen (NORCO/VICODIN) 5-325 MG tablet  Every 4 hours PRN        05/31/22 1711             Note:  This document was prepared using Dragon voice recognition software and may include unintentional dictation errors.   Racheal Patches, PA-C 05/31/22 1712    Georga Hacking, MD 05/31/22 2024

## 2022-05-31 NOTE — ED Triage Notes (Signed)
Patient to ED via POV for left foot pain. Patient states it started yesterday morning. Hx of gout.

## 2022-06-09 ENCOUNTER — Telehealth: Payer: Self-pay

## 2022-06-09 NOTE — Telephone Encounter (Signed)
Called pt to give [facility] after receiving referral from ED. No answer. Left msg

## 2022-07-13 ENCOUNTER — Other Ambulatory Visit: Payer: Self-pay

## 2022-07-13 ENCOUNTER — Emergency Department
Admission: EM | Admit: 2022-07-13 | Discharge: 2022-07-13 | Disposition: A | Discharge status: Home / Self Care | Payer: Self-pay | Attending: Emergency Medicine | Admitting: Emergency Medicine

## 2022-07-13 ENCOUNTER — Emergency Department: Payer: Self-pay

## 2022-07-13 ENCOUNTER — Encounter: Payer: Self-pay | Admitting: Emergency Medicine

## 2022-07-13 DIAGNOSIS — R1013 Epigastric pain: Secondary | ICD-10-CM

## 2022-07-13 DIAGNOSIS — I10 Essential (primary) hypertension: Secondary | ICD-10-CM | POA: Insufficient documentation

## 2022-07-13 DIAGNOSIS — J4521 Mild intermittent asthma with (acute) exacerbation: Secondary | ICD-10-CM | POA: Insufficient documentation

## 2022-07-13 DIAGNOSIS — J45909 Unspecified asthma, uncomplicated: Secondary | ICD-10-CM | POA: Insufficient documentation

## 2022-07-13 DIAGNOSIS — K30 Functional dyspepsia: Secondary | ICD-10-CM | POA: Insufficient documentation

## 2022-07-13 DIAGNOSIS — Z716 Tobacco abuse counseling: Secondary | ICD-10-CM | POA: Insufficient documentation

## 2022-07-13 LAB — BASIC METABOLIC PANEL
Anion gap: 12 (ref 5–15)
BUN: 17 mg/dL (ref 6–20)
CO2: 25 mmol/L (ref 22–32)
Calcium: 9.3 mg/dL (ref 8.9–10.3)
Chloride: 96 mmol/L — ABNORMAL LOW (ref 98–111)
Creatinine, Ser: 0.89 mg/dL (ref 0.61–1.24)
GFR, Estimated: >60 mL/min (ref >60)
Glucose, Bld: 99 mg/dL (ref 70–99)
Potassium: 3.9 mmol/L (ref 3.5–5.1)
Sodium: 133 mmol/L — ABNORMAL LOW (ref 135–145)

## 2022-07-13 LAB — HEPATIC FUNCTION PANEL
ALT: 21 U/L (ref 0–44)
AST: 19 U/L (ref 15–41)
Albumin: 3.9 g/dL (ref 3.5–5.0)
Alkaline Phosphatase: 56 U/L (ref 38–126)
Bilirubin, Direct: 0.1 mg/dL (ref 0.0–0.2)
Indirect Bilirubin: 0.8 mg/dL (ref 0.3–0.9)
Total Bilirubin: 0.9 mg/dL (ref 0.3–1.2)
Total Protein: 7.6 g/dL (ref 6.5–8.1)

## 2022-07-13 LAB — CBC
HCT: 40 % (ref 39.0–52.0)
Hemoglobin: 13.1 g/dL (ref 13.0–17.0)
MCH: 27.5 pg (ref 26.0–34.0)
MCHC: 32.8 g/dL (ref 30.0–36.0)
MCV: 84 fL (ref 80.0–100.0)
Platelets: 446 10*3/uL — ABNORMAL HIGH (ref 150–400)
RBC: 4.76 MIL/uL (ref 4.22–5.81)
RDW: 13.2 % (ref 11.5–15.5)
WBC: 8.7 10*3/uL (ref 4.0–10.5)
nRBC: 0 % (ref 0.0–0.2)

## 2022-07-13 LAB — LIPASE, BLOOD: Lipase: 49 U/L (ref 11–51)

## 2022-07-13 LAB — TROPONIN I (HIGH SENSITIVITY): Troponin I (High Sensitivity): 7 ng/L (ref <18)

## 2022-07-13 MED ORDER — OMEPRAZOLE MAGNESIUM 20 MG PO TBEC: 20.0000 mg | DELAYED_RELEASE_TABLET | Freq: Every day | ORAL | 1 refills | Status: DC

## 2022-07-13 MED ORDER — ALBUTEROL SULFATE HFA 108 (90 BASE) MCG/ACT IN AERS
1.0000 | INHALATION_SPRAY | Freq: Once | RESPIRATORY_TRACT | Status: AC
  Administered 2022-07-13: 1 via RESPIRATORY_TRACT
  Filled 2022-07-13: qty 6.7

## 2022-07-13 MED ORDER — PREDNISONE 20 MG PO TABS
60.0000 mg | ORAL_TABLET | Freq: Once | ORAL | Status: AC
  Administered 2022-07-13: 60 mg via ORAL
  Filled 2022-07-13: qty 3

## 2022-07-13 MED ORDER — NICOTINE POLACRILEX 4 MG MT LOZG: 4.0000 mg | LOZENGE | OROMUCOSAL | 0 refills | Status: DC | PRN

## 2022-07-13 MED ORDER — ALBUTEROL SULFATE HFA 108 (90 BASE) MCG/ACT IN AERS: 2.0000 | INHALATION_SPRAY | Freq: Four times a day (QID) | RESPIRATORY_TRACT | 2 refills | Status: DC | PRN

## 2022-07-13 MED ORDER — NICOTINE 7 MG/24HR TD PT24: 7.0000 mg | MEDICATED_PATCH | TRANSDERMAL | 0 refills | Status: DC

## 2022-07-13 MED ORDER — IPRATROPIUM-ALBUTEROL 0.5-2.5 (3) MG/3ML IN SOLN
9.0000 mL | Freq: Once | RESPIRATORY_TRACT | Status: AC
  Administered 2022-07-13: 9 mL via RESPIRATORY_TRACT
  Filled 2022-07-13: qty 9

## 2022-07-13 MED ORDER — PREDNISONE 50 MG PO TABS: ORAL_TABLET | ORAL | 0 refills | Status: DC

## 2022-07-13 NOTE — ED Notes (Signed)
See triage note  Presents with some SOB and epigastric/chest pain  States sx's started yesertday  was intermittent  today states he felt tight  no fever or cough

## 2022-07-13 NOTE — ED Triage Notes (Signed)
Pt here with cp that started yesterday. Pt states pain is centered and does not radiate. Pt states pain is intermittent and is tight in nature. Pt denies NVD. Pt also having SOB since yesterday as well. Pt denies fevers.

## 2022-07-13 NOTE — ED Provider Triage Note (Signed)
Emergency Medicine Provider Triage Evaluation Note  Justin Reid , a 42 y.o. male  was evaluated in triage.  Pt complains of cp and ruq pain.  Review of Systems  Positive:  Negative:   Physical Exam  BP (!) 142/120   Pulse 93   Temp 97.9 F (36.6 C) (Oral)   Resp (!) 22   Ht 5\' 11"  (1.803 m)   Wt 113.3 kg   SpO2 95%   BMI 34.84 kg/m  Gen:   Awake, no distress   Resp:  Normal effort  MSK:   Moves extremities without difficulty  Other:    Medical Decision Making  Medically screening exam initiated at 3:59 PM.  Appropriate orders placed.  Justin Reid was informed that the remainder of the evaluation will be completed by another provider, this initial triage assessment does not replace that evaluation, and the importance of remaining in the ED until their evaluation is complete.     Faythe Ghee, PA-C 07/13/22 1559

## 2022-07-13 NOTE — ED Provider Notes (Signed)
[facility] Provider Note    Event Date/Time   First MD Initiated Contact with Patient 07/13/22 1701     (approximate)   History   Chest Pain and Shortness of Breath   HPI  Justin Reid is a 42 y.o. male   Past medical history of asthma and hypertension presents emergency department with 2 days of chest tightness and shortness of breath.  No obvious inciting event but feels like his asthma exacerbations experienced in the past.  He denies cough or fever.   He ran out of his albuterol inhaler and would like a refill.   He has had some epigastric discomfort this is subacute.  No GI bleeding.  He smokes cigarettes, approximately quarter of a pack to half pack a day.  Independent historian contributed to assessment above: His wife is at bedside to corroborate information past medical history obtained above     Physical Exam   Triage Vital Signs: ED Triage Vitals  Enc Vitals Group     BP 07/13/22 1555 (!) 142/120     Pulse Rate 07/13/22 1555 93     Resp 07/13/22 1555 (!) 22     Temp 07/13/22 1555 97.9 F (36.6 C)     Temp Source 07/13/22 1555 Oral     SpO2 07/13/22 1555 95 %     Weight 07/13/22 1556 249 lb 12.5 oz (113.3 kg)     Height 07/13/22 1556 5\' 11"  (1.803 m)     Head Circumference --      Peak Flow --      Pain Score 07/13/22 1555 8     Pain Loc --      Pain Edu? --      Excl. in GC? --     Most recent vital signs: Vitals:   07/13/22 1555 07/13/22 1814  BP: (!) 142/120 (!) 140/92  Pulse: 93 88  Resp: (!) 22 20  Temp: 97.9 F (36.6 C)   SpO2: 95% 95%    General: Awake, no distress.  CV:  Good peripheral perfusion.  Resp:  Normal effort.  Abd:  No distention.  Other:  Comfortable appearing normal hemodynamics, no hypoxemia, no respiratory distress and afebrile.  He does have wheezing throughout with good air movement otherwise.  He has an epigastric tenderness to palpation.  No rigidity or guarding.   ED Results /  Procedures / Treatments   Labs (all labs ordered are listed, but only abnormal results are displayed) Labs Reviewed  BASIC METABOLIC PANEL - Abnormal; Notable for the following components:      Result Value   Sodium 133 (*)    Chloride 96 (*)    All other components within normal limits  CBC - Abnormal; Notable for the following components:   Platelets 446 (*)    All other components within normal limits  HEPATIC FUNCTION PANEL  LIPASE, BLOOD  TROPONIN I (HIGH SENSITIVITY)  TROPONIN I (HIGH SENSITIVITY)     I ordered and reviewed the above labs they are notable for white blood cell count is normal as are LFTs and lipase.  H&H is stable from prior testing obtained in May  EKG  ED ECG REPORT I, Pilar Jarvis, the attending physician, personally viewed and interpreted this ECG.   Date: 07/13/2022  EKG Time: 1553  Rate: 95  Rhythm: nsr  Axis: nl  Intervals:none  ST&T Change: no stemi    RADIOLOGY I independently reviewed and interpreted chest x-ray and I see  no obvious focality pneumothorax   PROCEDURES:  Critical Care performed: No  Procedures   MEDICATIONS ORDERED IN ED: Medications  predniSONE (DELTASONE) tablet 60 mg (60 mg Oral Given 07/13/22 1805)  ipratropium-albuterol (DUONEB) 0.5-2.5 (3) MG/3ML nebulizer solution 9 mL (9 mLs Nebulization Given 07/13/22 1806)  albuterol (VENTOLIN HFA) 108 (90 Base) MCG/ACT inhaler 1 puff (1 puff Inhalation Given by Other 07/13/22 1847)    IMPRESSION / MDM / ASSESSMENT AND PLAN / ED COURSE  I reviewed the triage vital signs and the nursing notes.                                Patient's presentation is most consistent with acute presentation with potential threat to life or bodily function.  Differential diagnosis includes, but is not limited to, exacerbation, COPD exacerbation, ACS, PE, gastritis, dyspepsia, cholecystitis, pancreatitis   The patient is on the cardiac monitor to evaluate for evidence of arrhythmia and/or  significant heart rate changes.  MDM:    Consistent with asthma exacerbation with chest tightness and shortness of breath with wheezing on auscultation, ran out of his asthma inhaler, given DuoNeb and prednisone with good results.  Prednisone burst and refill of his albuterol inhaler given to the patient.  I think his chest pain reported was actually epigastric pain with tenderness to the epigastrium history of alcohol use.  No reported GI bleeding normal H&H so I doubt significant upper GI bleeding.  I think this is dyspepsia/gastritis.  I advised him to decrease alcohol use and avoid NSAID use.  I considered cholecystitis or cholelithiasis or pancreatitis but given normal LFTs, normal lipase, normal right upper quadrant ultrasound think these are less likely.  Other intra-abdominal infections like appendicitis I think are less likely given lower quadrants are benign.  PPI prescription.  Considered ACS (less likely given his symptoms more match with asthma exacerbation.  EKG is nonischemic and first troponin is negative.  He is eager to leave after feeling better and was advised to stay for second troponin but refused.  Given the patient is been treated for asthma exacerbation and feels better, workup otherwise unremarkable as above I think outpatient treatment and follow-up most appropriate this time.  Patient is eager to leave the emergency department.  Discharge.   I spent 5 minutes counseling this patient on smoking cessation.  We spoke about the patient's current tobacco use, impact of smoking, assessed willingness to quit, methods for cessation including medical management and nicotine replacement therapy (which I prescribed to the patient) and advised follow-up with primary doctor to continue to address smoking cessation.       FINAL CLINICAL IMPRESSION(S) / ED DIAGNOSES   Final diagnoses:  Mild intermittent asthma with exacerbation  Dyspepsia  Encounter for smoking cessation  counseling     Rx / DC Orders   ED Discharge Orders          Ordered    predniSONE (DELTASONE) 50 MG tablet        07/13/22 1826    albuterol (VENTOLIN HFA) 108 (90 Base) MCG/ACT inhaler  Every 6 hours PRN        07/13/22 1826    nicotine polacrilex (NICOTINE MINI) 4 MG lozenge  As needed        07/13/22 1826    nicotine (NICODERM CQ - DOSED IN MG/24 HR) 7 mg/24hr patch  Every 24 hours        07/13/22 1826  omeprazole (PRILOSEC OTC) 20 MG tablet  Daily        07/13/22 1826             Note:  This document was prepared using Dragon voice recognition software and may include unintentional dictation errors.Pilar Jarvis, MD 07/13/22 [phone]

## 2022-07-13 NOTE — Discharge Instructions (Signed)
Use your albuterol inhaler 2 puffs every 4-6 hours as needed for wheezing or shortness of breath.  Take prednisone steroid for the full course as prescribed.    For your abdominal discomfort take antacid omeprazole as prescribed.  Avoid excessive alcohol use or nonsteroidal anti-inflammatories like Advil, Motrin, ibuprofen, Aleve as this may upset your stomach more.  Thank you for choosing [place] for your health care today!  Please see your primary doctor this week for a follow up appointment.   If you have any new, worsening, or unexpected symptoms call your doctor right away or come back to the emergency department for reevaluation.  It was my pleasure to care for you today.   Daneil Dan Modesto Charon, MD

## 2022-09-06 ENCOUNTER — Encounter: Payer: Self-pay | Admitting: Emergency Medicine

## 2022-09-06 ENCOUNTER — Other Ambulatory Visit: Payer: Self-pay

## 2022-09-06 ENCOUNTER — Emergency Department
Admission: EM | Admit: 2022-09-06 | Discharge: 2022-09-06 | Disposition: A | Discharge status: Home / Self Care | Payer: MEDICAID | Attending: Emergency Medicine | Admitting: Emergency Medicine

## 2022-09-06 DIAGNOSIS — M25511 Pain in right shoulder: Secondary | ICD-10-CM | POA: Insufficient documentation

## 2022-09-06 DIAGNOSIS — G8911 Acute pain due to trauma: Secondary | ICD-10-CM | POA: Insufficient documentation

## 2022-09-06 DIAGNOSIS — Y990 Civilian activity done for income or pay: Secondary | ICD-10-CM | POA: Insufficient documentation

## 2022-09-06 DIAGNOSIS — X500XXA Overexertion from strenuous movement or load, initial encounter: Secondary | ICD-10-CM | POA: Insufficient documentation

## 2022-09-06 MED ORDER — NAPROXEN 500 MG PO TABS: 500.0000 mg | ORAL_TABLET | Freq: Two times a day (BID) | ORAL | 2 refills | Status: DC

## 2022-09-06 MED ORDER — METHOCARBAMOL 500 MG PO TABS: 500.0000 mg | ORAL_TABLET | Freq: Three times a day (TID) | ORAL | 1 refills | Status: DC | PRN

## 2022-09-06 MED ORDER — KETOROLAC TROMETHAMINE 30 MG/ML IJ SOLN
30.0000 mg | Freq: Once | INTRAMUSCULAR | Status: AC
  Administered 2022-09-06: 30 mg via INTRAMUSCULAR
  Filled 2022-09-06: qty 1

## 2022-09-06 NOTE — ED Notes (Signed)
See triage note  Presents with right shoulder pain for 4 days  States he does do heavy lifting  Hx of rotator cuff problems

## 2022-09-06 NOTE — ED Triage Notes (Signed)
Pt endorses right shoulder pain for 4 days. Hx of rotator cuff problem a few years ago. No relief with ibuprofen. Does report lifting heavy with work.

## 2022-09-06 NOTE — ED Provider Notes (Signed)
[facility] Provider Note    Event Date/Time   First MD Initiated Contact with Patient 09/06/22 1329     (approximate)   History   Shoulder Pain   HPI  Justin Reid is a 42 y.o. male who presents with complaints of right shoulder pain.  Patient reports he lifts lots of heavy things at work, has been hurting for about 4 days, worse when lying on his side.  He is able to lift his arm to 90 degrees but becomes more severe thereafter.     Physical Exam   Triage Vital Signs: ED Triage Vitals  Encounter Vitals Group     BP 09/06/22 1326 (!) 161/112     Systolic BP Percentile --      Diastolic BP Percentile --      Pulse Rate 09/06/22 1326 92     Resp 09/06/22 1326 16     Temp 09/06/22 1326 97.8 F (36.6 C)     Temp Source 09/06/22 1326 Oral     SpO2 09/06/22 1326 94 %     Weight 09/06/22 1326 114 kg (251 lb 5.2 oz)     Height 09/06/22 1326 1.803 m (5\' 11" )     Head Circumference --      Peak Flow --      Pain Score 09/06/22 1325 9     Pain Loc --      Pain Education --      Exclude from Growth Chart --     Most recent vital signs: Vitals:   09/06/22 1326  BP: (!) 161/112  Pulse: 92  Resp: 16  Temp: 97.8 F (36.6 C)  SpO2: 94%     General: Awake, no distress.  CV:  Good peripheral perfusion.  Resp:  Normal effort.  Abd:  No distention.  Other:  Able to range his right arm relatively well however above 90 degrees more painful.  Warm and well-perfused distally.   ED Results / Procedures / Treatments   Labs (all labs ordered are listed, but only abnormal results are displayed) Labs Reviewed - No data to display   EKG     RADIOLOGY     PROCEDURES:  Critical Care performed:   Procedures   MEDICATIONS ORDERED IN ED: Medications  ketorolac (TORADOL) 30 MG/ML injection 30 mg (has no administration in time range)     IMPRESSION / MDM / ASSESSMENT AND PLAN / ED COURSE  I reviewed the triage vital signs and the  nursing notes. Patient's presentation is most consistent with acute complicated illness / injury requiring diagnostic workup.  Patient presents with right shoulder pain as detailed above, does have a history of rotator cuff injury last year which felt similar and resolved on its own.  Will treat with IM Toradol here for musculoskeletal injury.  Outpatient follow-up with Ortho, may need MRI to evaluate rotator cuff/biceps tendon        FINAL CLINICAL IMPRESSION(S) / ED DIAGNOSES   Final diagnoses:  Acute pain of right shoulder     Rx / DC Orders   ED Discharge Orders          Ordered    naproxen (NAPROSYN) 500 MG tablet  2 times daily with meals        09/06/22 1343    methocarbamol (ROBAXIN) 500 MG tablet  Every 8 hours PRN        09/06/22 1343  Note:  This document was prepared using Dragon voice recognition software and may include unintentional dictation errors.   Jene Every, MD 09/06/22 [phone]

## 2022-09-09 ENCOUNTER — Emergency Department
Admission: EM | Admit: 2022-09-09 | Discharge: 2022-09-09 | Disposition: A | Discharge status: Home / Self Care | Payer: Self-pay | Attending: Emergency Medicine | Admitting: Emergency Medicine

## 2022-09-09 ENCOUNTER — Encounter: Payer: Self-pay | Admitting: Emergency Medicine

## 2022-09-09 ENCOUNTER — Other Ambulatory Visit: Payer: Self-pay

## 2022-09-09 ENCOUNTER — Emergency Department: Payer: Self-pay

## 2022-09-09 DIAGNOSIS — I10 Essential (primary) hypertension: Secondary | ICD-10-CM | POA: Insufficient documentation

## 2022-09-09 DIAGNOSIS — J45909 Unspecified asthma, uncomplicated: Secondary | ICD-10-CM | POA: Insufficient documentation

## 2022-09-09 DIAGNOSIS — J069 Acute upper respiratory infection, unspecified: Secondary | ICD-10-CM | POA: Insufficient documentation

## 2022-09-09 DIAGNOSIS — Z20822 Contact with and (suspected) exposure to covid-19: Secondary | ICD-10-CM | POA: Insufficient documentation

## 2022-09-09 LAB — RESP PANEL BY RT-PCR (RSV, FLU A&B, COVID)  RVPGX2
Influenza A by PCR: NEGATIVE
Influenza B by PCR: NEGATIVE
Resp Syncytial Virus by PCR: NEGATIVE
SARS Coronavirus 2 by RT PCR: NEGATIVE

## 2022-09-09 MED ORDER — DEXAMETHASONE SODIUM PHOSPHATE 10 MG/ML IJ SOLN
10.0000 mg | Freq: Once | INTRAMUSCULAR | Status: AC
  Administered 2022-09-09: 10 mg via INTRAMUSCULAR
  Filled 2022-09-09: qty 1

## 2022-09-09 MED ORDER — ALBUTEROL SULFATE (2.5 MG/3ML) 0.083% IN NEBU
2.5000 mg | INHALATION_SOLUTION | Freq: Once | RESPIRATORY_TRACT | Status: AC
  Administered 2022-09-09: 2.5 mg via RESPIRATORY_TRACT
  Filled 2022-09-09: qty 3

## 2022-09-09 MED ORDER — ALBUTEROL SULFATE HFA 108 (90 BASE) MCG/ACT IN AERS: 2.0000 | INHALATION_SPRAY | Freq: Four times a day (QID) | RESPIRATORY_TRACT | 2 refills | Status: DC | PRN

## 2022-09-09 MED ORDER — FLUTICASONE FUROATE-VILANTEROL 200-25 MCG/ACT IN AEPB: 1.0000 | INHALATION_SPRAY | Freq: Every day | RESPIRATORY_TRACT | 2 refills | Status: DC

## 2022-09-09 NOTE — ED Provider Notes (Signed)
[facility] Provider Note    Event Date/Time   First MD Initiated Contact with Patient 09/09/22 [phone]     (approximate)   History   Cough   HPI  Justin Reid is a 42 y.o. male  with PMH of asthma, HTN who presents for evaluation of a cough.  Patient states his cough began yesterday afternoon and is making his asthma worse.  He reports he has ran out of his albuterol inhaler.  He states he has been using it about 4 times a day every day.      Physical Exam   Triage Vital Signs: ED Triage Vitals  Encounter Vitals Group     BP 09/09/22 0726 (!) 167/107     Systolic BP Percentile --      Diastolic BP Percentile --      Pulse Rate 09/09/22 0726 (!) 103     Resp --      Temp 09/09/22 0726 98.3 F (36.8 C)     Temp Source 09/09/22 0726 Oral     SpO2 09/09/22 0726 91 %     Weight 09/09/22 0722 (!) 310 lb (140.6 kg)     Height 09/09/22 0722 5\' 9"  (1.753 m)     Head Circumference --      Peak Flow --      Pain Score 09/09/22 0721 9     Pain Loc --      Pain Education --      Exclude from Growth Chart --     Most recent vital signs: Vitals:   09/09/22 0726  BP: (!) 167/107  Pulse: (!) 103  Temp: 98.3 F (36.8 C)  SpO2: 91%    General: Awake, no distress.  Coughing frequently throughout the exam. CV:  Good peripheral perfusion.  RRR. Resp:  Normal effort.  Bilateral wheezing in all lung fields. Abd:  No distention.     ED Results / Procedures / Treatments   Labs (all labs ordered are listed, but only abnormal results are displayed) Labs Reviewed  RESP PANEL BY RT-PCR (RSV, FLU A&B, COVID)  [id number]    RADIOLOGY  Chest x-ray obtained, interpreted the images as well as reviewed the radiologist report which was negative for any acute cardiopulmonary pathology.    PROCEDURES:  Critical Care performed: No  Procedures   MEDICATIONS ORDERED IN ED: Medications  dexamethasone (DECADRON) injection 10 mg (10 mg Intramuscular Given  09/09/22 0819)  albuterol (PROVENTIL) (2.5 MG/3ML) 0.083% nebulizer solution 2.5 mg (2.5 mg Nebulization Given 09/09/22 0816)  albuterol (PROVENTIL) (2.5 MG/3ML) 0.083% nebulizer solution 2.5 mg (2.5 mg Nebulization Given 09/09/22 0816)     IMPRESSION / MDM / ASSESSMENT AND PLAN / ED COURSE  I reviewed the triage vital signs and the nursing notes.                             42 year old male presents for evaluation of cough.  Patient was hypertensive and tachycardic in triage in mild distress on exam but nontoxic-appearing.  Differential diagnosis includes, but is not limited to, COVID, flu, viral URI,  asthma exacerbation  Patient's presentation is most consistent with acute complicated illness / injury requiring diagnostic workup.  Respiratory panel was negative for flu, COVID and strep.  Chest x-ray obtained to evaluate for an underlying pneumonia.  I interpreted the images as well as reviewed the radiologist report which was negative for any acute cardiopulmonary abnormalities.  Initial lung exam revealed bilateral wheezing in all lung fields.  Patient was given 2 albuterol nebulizer treatments and a shot of dexamethasone.  Upon my reassessment his wheezing had greatly improved.  Patient also reported an improvement in the way he was feeling.  I have refilled patient's steroid inhaler and his albuterol inhaler.  I placed a referral for primary care provider to help with his asthma management.  Patient voiced understanding, was agreeable to plan and was stable at discharge.    FINAL CLINICAL IMPRESSION(S) / ED DIAGNOSES   Final diagnoses:  Viral URI with cough     Rx / DC Orders   ED Discharge Orders          Ordered    albuterol (VENTOLIN HFA) 108 (90 Base) MCG/ACT inhaler  Every 6 hours PRN        09/09/22 0923    fluticasone furoate-vilanterol (BREO ELLIPTA) 200-25 MCG/ACT AEPB  Daily        09/09/22 0923    Ambulatory Referral to Primary Care (Establish Care)       Comments:  Asthma management   09/09/22 0924             Note:  This document was prepared using Dragon voice recognition software and may include unintentional dictation errors.   Cameron Ali, PA-C 09/09/22 1011    Trinna Post, MD 09/10/22 [phone]

## 2022-09-09 NOTE — ED Notes (Signed)
See triage note  Presents with non productive cough and some wheezing  Unsure of fever  Currently afebrile on arrival  But he is also out of his inhaler

## 2022-09-09 NOTE — Discharge Instructions (Addendum)
Your COVID, flu and strep test was negative.  Your chest x-ray was normal.  Please manage your symptoms with over-the-counter cold medications.  I have refilled your asthma medications and placed a referral for a primary care provider.  The Breo Ellipta should be used once daily.  The albuterol inhaler should be used only as needed.

## 2022-09-09 NOTE — ED Triage Notes (Signed)
Patient to ED via POV for non-productive cough. Started yesterday afternoon. Hx asthma. Patient states chest hurts when coughing.

## 2022-09-13 NOTE — Group Note (Deleted)

## 2022-09-21 ENCOUNTER — Emergency Department: Payer: MEDICAID

## 2022-09-21 ENCOUNTER — Other Ambulatory Visit: Payer: Self-pay

## 2022-09-21 ENCOUNTER — Emergency Department
Admission: EM | Admit: 2022-09-21 | Discharge: 2022-09-21 | Disposition: A | Discharge status: Home / Self Care | Payer: MEDICAID | Attending: Emergency Medicine | Admitting: Emergency Medicine

## 2022-09-21 DIAGNOSIS — Z91148 Patient's other noncompliance with medication regimen for other reason: Secondary | ICD-10-CM | POA: Insufficient documentation

## 2022-09-21 DIAGNOSIS — I10 Essential (primary) hypertension: Secondary | ICD-10-CM | POA: Insufficient documentation

## 2022-09-21 DIAGNOSIS — M25511 Pain in right shoulder: Secondary | ICD-10-CM | POA: Insufficient documentation

## 2022-09-21 DIAGNOSIS — Z91199 Patient's noncompliance with other medical treatment and regimen due to unspecified reason: Secondary | ICD-10-CM

## 2022-09-21 MED ORDER — AMLODIPINE BESYLATE 10 MG PO TABS: 10.0000 mg | ORAL_TABLET | Freq: Every day | ORAL | 3 refills | Status: DC

## 2022-09-21 MED ORDER — ETODOLAC 400 MG PO TABS: 400.0000 mg | ORAL_TABLET | Freq: Two times a day (BID) | ORAL | 0 refills | Status: DC

## 2022-09-21 MED ORDER — AMLODIPINE BESYLATE 5 MG PO TABS
10.0000 mg | ORAL_TABLET | Freq: Once | ORAL | Status: AC
  Administered 2022-09-21: 10 mg via ORAL
  Filled 2022-09-21: qty 2

## 2022-09-21 NOTE — Discharge Instructions (Addendum)
Call one of the clinics listed on your discharge papers for an appointment for continued management of your hypertension.  A prescription was sent to [facility] for you to begin taking along with medication for your shoulder.  If your shoulder is not improving you should follow-up with Dr. Audelia Acton who is on-call for orthopedics and his contact information and address are listed on your discharge papers.  Please go to the following website to schedule new (and existing) patient appointments:   [email or web address]   The following is a list of primary care offices in the area who are accepting new patients at this time.  Please reach out to one of them directly and let them know you would like to schedule an appointment to follow up on an Emergency Department visit, and/or to establish a new primary care provider (PCP).  There are likely other primary care clinics in the are who are accepting new patients, but this is an excellent place to start:  [facility] Lead physician: Dr Shirlee Latch [address] [place], [place] [address] [phone]  [facility] Lead Physician: Dr Alba Cory [address], [place], [place] [address] [phone]  [facility]  Lead Physician: Dr Olevia Perches [address] [place], [place] [address] [phone]  [facility] Lead Physician: Dr Sofie Hartigan [address], [place], [place] [address] [phone]  [facility] at [facility] Lead Physician: Dr Bari Edward [address] [place], [place], [place] [address] [phone]

## 2022-09-21 NOTE — ED Triage Notes (Signed)
Pt here with right shoulder pain for weeks. Pt states he has not had any imaging of his shoulder. Pt was told a year ago that he may have tore his rotator cuff but is unsure.

## 2022-09-21 NOTE — ED Provider Notes (Signed)
[facility] Provider Note    Event Date/Time   First MD Initiated Contact with Patient 09/21/22 1226     (approximate)   History   Shoulder Pain   HPI  Justin Reid is a 42 y.o. male presents to the ED with complaint of right shoulder pain for several weeks.  Patient states he was told 1 year ago that he may have a rotator cuff injury but is unsure.  Patient denies any recent injury to his shoulder.  Patient also has a history of hypertension and has not had his blood pressure medication in 1 month.     Physical Exam   Triage Vital Signs: ED Triage Vitals  Encounter Vitals Group     BP 09/21/22 1108 (!) 140/109     Systolic BP Percentile --      Diastolic BP Percentile --      Pulse Rate 09/21/22 1107 77     Resp 09/21/22 1107 (!) 21     Temp 09/21/22 1108 97.8 F (36.6 C)     Temp src --      SpO2 09/21/22 1107 96 %     Weight 09/21/22 1108 (!) 309 lb 15.5 oz (140.6 kg)     Height 09/21/22 1108 5\' 9"  (1.753 m)     Head Circumference --      Peak Flow --      Pain Score 09/21/22 1108 10     Pain Loc --      Pain Education --      Exclude from Growth Chart --     Most recent vital signs: Vitals:   09/21/22 1108 09/21/22 1327  BP: (!) 140/109 (!) 148/110  Pulse:  62  Resp:  20  Temp: 97.8 F (36.6 C)   SpO2:  98%     General: Awake, no distress.  CV:  Good peripheral perfusion.  Resp:  Normal effort.  Abd:  No distention.  Other:  Right shoulder without deformity.  Range of motion without crepitus or restriction.  Patient is able to abduct and adduct without difficulty.  Mild diffused tenderness on palpation of the deltoid area.  Patient is still able to abduct against resistance without any difficulty.  Skin is intact.  No discoloration present.  Pulses present distally.   ED Results / Procedures / Treatments   Labs (all labs ordered are listed, but only abnormal results are displayed) Labs Reviewed - No data to  display   RADIOLOGY  Right shoulder x-ray images were reviewed by myself independent of the radiologist and no acute bony abnormality was noted.   PROCEDURES:  Critical Care performed:   Procedures   MEDICATIONS ORDERED IN ED: Medications  amLODipine (NORVASC) tablet 10 mg (10 mg Oral Given 09/21/22 1355)     IMPRESSION / MDM / ASSESSMENT AND PLAN / ED COURSE  I reviewed the triage vital signs and the nursing notes.   Differential diagnosis includes, but is not limited to, chronic right shoulder pain, shoulder strain, dislocation, degenerative joint disease, tendinitis, bursitis, consider rotator cuff tear, hypertension uncontrolled, patient medically noncompliant.  42 year old male presents to the ED with complaint of right shoulder pain for approximately 1 year with recurrent pain and no recent trauma.  Patient in the past has taken anti-inflammatories without any relief.  He also reports that he has not taken his blood pressure medication in 1 month as he is not currently have a PCP.  He denies any symptoms related to  his hypertension such as shortness of breath, difficulty breathing, chest pain, headache, dizziness or vision changes.  Patient was given amlodipine while in the ED as this was the last medication that he was taking.  A prescription for the same was sent to his pharmacy with refills for 3 months.  Patient is strongly encouraged to obtain a PCP from the list of physicians given to him and the importance of controlling his hypertension.  A prescription for etodolac was sent to the pharmacy for his right shoulder and he is to follow-up with Dr. Audelia Acton if any continued problems.      Patient's presentation is most consistent with acute complicated illness / injury requiring diagnostic workup.  FINAL CLINICAL IMPRESSION(S) / ED DIAGNOSES   Final diagnoses:  Nontraumatic pain of right shoulder  Uncontrolled hypertension  Medically noncompliant     Rx / DC Orders    ED Discharge Orders          Ordered    amLODipine (NORVASC) 10 MG tablet  Daily        09/21/22 1339    etodolac (LODINE) 400 MG tablet  2 times daily        09/21/22 1339             Note:  This document was prepared using Dragon voice recognition software and may include unintentional dictation errors.   Tommi Rumps, PA-C 09/21/22 1356    Sharman Cheek, MD 09/21/22 [phone]

## 2022-09-27 ENCOUNTER — Emergency Department
Admission: EM | Admit: 2022-09-27 | Discharge: 2022-09-27 | Disposition: A | Discharge status: Home / Self Care | Payer: MEDICAID | Attending: Emergency Medicine | Admitting: Emergency Medicine

## 2022-09-27 ENCOUNTER — Other Ambulatory Visit: Payer: Self-pay

## 2022-09-27 DIAGNOSIS — J4520 Mild intermittent asthma, uncomplicated: Secondary | ICD-10-CM | POA: Insufficient documentation

## 2022-09-27 DIAGNOSIS — Z760 Encounter for issue of repeat prescription: Secondary | ICD-10-CM | POA: Insufficient documentation

## 2022-09-27 MED ORDER — ALBUTEROL SULFATE HFA 108 (90 BASE) MCG/ACT IN AERS: 2.0000 | INHALATION_SPRAY | Freq: Four times a day (QID) | RESPIRATORY_TRACT | 2 refills | Status: DC | PRN

## 2022-09-27 NOTE — Discharge Instructions (Signed)
Please return for any new, worsening, or change in symptoms or other concerns.  It was a pleasure caring for you today.

## 2022-09-27 NOTE — ED Triage Notes (Signed)
Pt states ran out of inhaler yesterday and needs a new one Ambulatory to triage, NAD noted

## 2022-09-27 NOTE — ED Provider Notes (Signed)
[facility] Provider Note    Event Date/Time   First MD Initiated Contact with Patient 09/27/22 1314     (approximate)   History   Medication Refill   HPI  Justin Reid is a 42 y.o. male with a past medical history of asthma who presents today with request for refill of his albuterol inhaler.  He does not feel short of breath today, reports that he ran out of it and just wants to be sure that he has not on hand.  No chest pain.  No weakness or fatigue.  No complaints today.  He he is requesting a work note.  Patient Active Problem List   Diagnosis Date Noted   Influenza A 12/28/2021   Tobacco abuse 12/28/2021   Obesity (BMI 30-39.9) 12/28/2021   OSA (obstructive sleep apnea) 12/28/2021   Hypokalemia due to excessive gastrointestinal loss of potassium 12/04/2020   SOB (shortness of breath) 11/11/2020   Shortness of breath 11/10/2020   At risk for sleep apnea 11/10/2020   Obesity hypoventilation syndrome (HCC) 11/10/2020   Truncal obesity 11/10/2020   2019-nCoV vaccination declined 11/10/2020   Hyperkalemia    Pneumonia due to COVID-19 virus 10/07/2019   Hypokalemia    Hyponatremia    Acute midline thoracic back pain    Asthma 07/07/2016          Physical Exam   Triage Vital Signs: ED Triage Vitals  Encounter Vitals Group     BP 09/27/22 1257 (!) 149/104     Systolic BP Percentile --      Diastolic BP Percentile --      Pulse Rate 09/27/22 1256 87     Resp 09/27/22 1256 20     Temp 09/27/22 1257 98.3 F (36.8 C)     Temp src --      SpO2 09/27/22 1256 95 %     Weight 09/27/22 1256 (!) 352 lb (159.7 kg)     Height 09/27/22 1256 5\' 11"  (1.803 m)     Head Circumference --      Peak Flow --      Pain Score 09/27/22 1256 0     Pain Loc --      Pain Education --      Exclude from Growth Chart --     Most recent vital signs: Vitals:   09/27/22 1257 09/27/22 1324  BP: (!) 149/104 (!) 148/99  Pulse:  88  Resp:  20  Temp: 98.3  F (36.8 C)   SpO2:  96%    Physical Exam Vitals and nursing note reviewed.  Constitutional:      General: Awake and alert. No acute distress.    Appearance: Normal appearance. The patient is normal weight.  HENT:     Head: Normocephalic and atraumatic.     Mouth: Mucous membranes are moist.  Eyes:     General: PERRL. Normal EOMs        Right eye: No discharge.        Left eye: No discharge.     Conjunctiva/sclera: Conjunctivae normal.  Cardiovascular:     Rate and Rhythm: Normal rate and regular rhythm.     Pulses: Normal pulses.  Pulmonary:     Effort: Pulmonary effort is normal. No respiratory distress.  No accessory muscle use    Breath sounds: Normal breath sounds.  No wheezing Abdominal:     Abdomen is soft. There is no abdominal tenderness. No rebound or guarding. No distention.  Musculoskeletal:        General: No swelling. Normal range of motion.     Cervical back: Normal range of motion and neck supple.  No lower extremity edema Skin:    General: Skin is warm and dry.     Capillary Refill: Capillary refill takes less than 2 seconds.     Findings: No rash.  Neurological:     Mental Status: The patient is awake and alert.      ED Results / Procedures / Treatments   Labs (all labs ordered are listed, but only abnormal results are displayed) Labs Reviewed - No data to display   EKG     RADIOLOGY     PROCEDURES:  Critical Care performed:   Procedures   MEDICATIONS ORDERED IN ED: Medications - No data to display   IMPRESSION / MDM / ASSESSMENT AND PLAN / ED COURSE  I reviewed the triage vital signs and the nursing notes.   Differential diagnosis includes, but is not limited to, medication refill, asthma, bronchitis.  I reviewed the patient's chart.  Patient has had multiple emergency department visits.  Patient is awake and alert, hemodynamically stable and afebrile.  He has normal oxygen saturation on room air, demonstrates no increased  work of breathing, has no wheezing, does not feel short of breath.  He does not have any complaints currently, just reports that he needs a refill of his asthma inhaler just in case his asthma returns.  He currently does not have a cough, chest pain, shortness of breath, or any other concerns today.  He is prescription was refilled as requested.  He was discharged in stable condition.   Patient's presentation is most consistent with acute complicated illness / injury requiring diagnostic workup.    FINAL CLINICAL IMPRESSION(S) / ED DIAGNOSES   Final diagnoses:  Encounter for medication refill  Mild intermittent asthma without complication     Rx / DC Orders   ED Discharge Orders          Ordered    albuterol (VENTOLIN HFA) 108 (90 Base) MCG/ACT inhaler  Every 6 hours PRN        09/27/22 1326             Note:  This document was prepared using Dragon voice recognition software and may include unintentional dictation errors.   Jackelyn Hoehn, PA-C 09/27/22 1526    Trinna Post, MD 09/27/22 [phone]

## 2022-10-15 ENCOUNTER — Emergency Department
Admission: EM | Admit: 2022-10-15 | Discharge: 2022-10-15 | Disposition: A | Discharge status: Home / Self Care | Payer: MEDICAID | Attending: Emergency Medicine | Admitting: Emergency Medicine

## 2022-10-15 ENCOUNTER — Other Ambulatory Visit: Payer: Self-pay

## 2022-10-15 DIAGNOSIS — I10 Essential (primary) hypertension: Secondary | ICD-10-CM | POA: Insufficient documentation

## 2022-10-15 DIAGNOSIS — J069 Acute upper respiratory infection, unspecified: Secondary | ICD-10-CM | POA: Insufficient documentation

## 2022-10-15 DIAGNOSIS — Z716 Tobacco abuse counseling: Secondary | ICD-10-CM | POA: Insufficient documentation

## 2022-10-15 DIAGNOSIS — J40 Bronchitis, not specified as acute or chronic: Secondary | ICD-10-CM | POA: Insufficient documentation

## 2022-10-15 DIAGNOSIS — Z20822 Contact with and (suspected) exposure to covid-19: Secondary | ICD-10-CM | POA: Insufficient documentation

## 2022-10-15 LAB — SARS CORONAVIRUS 2 BY RT PCR: SARS Coronavirus 2 by RT PCR: NEGATIVE

## 2022-10-15 MED ORDER — DOXYCYCLINE MONOHYDRATE 100 MG PO TABS: 100.0000 mg | ORAL_TABLET | Freq: Two times a day (BID) | ORAL | 0 refills | Status: AC

## 2022-10-15 MED ORDER — AMLODIPINE BESYLATE 5 MG PO TABS: 5.0000 mg | ORAL_TABLET | Freq: Every day | ORAL | 2 refills | Status: DC

## 2022-10-15 MED ORDER — ALBUTEROL SULFATE HFA 108 (90 BASE) MCG/ACT IN AERS: 4.0000 | INHALATION_SPRAY | RESPIRATORY_TRACT | 2 refills | Status: DC | PRN

## 2022-10-15 MED ORDER — PREDNISONE 50 MG PO TABS: 50.0000 mg | ORAL_TABLET | Freq: Every day | ORAL | 0 refills | Status: AC

## 2022-10-15 NOTE — ED Triage Notes (Signed)
Pt to ED for sore throat and congestion started yesterday. Partner has been sick.

## 2022-10-15 NOTE — Discharge Instructions (Addendum)
You are seen in the emergency department for cough and shortness of breath.  You had significant wheezing on exam.  Your COVID testing was negative.  Concern that you have acute bronchitis.  Likely have an upper respiratory infection that is causing inflammation in your lungs.  It is important that you work on cutting back/smoking cessation.  You can use GoodRx app to help with prescription prices.   Your blood pressure has been significantly elevated in the emergency department at every visit.  You have been prescribed amlodipine in the past, it is important that you pick up this medication and you take it as prescribed.  A referral was placed for primary care provider, you are also given a list of primary care providers in the area.  Return to the emergency department if you have any worsening symptoms.  You can use over-the-counter cough medication.  Hot tea and honey can also help with the cough and your sore throat.  Prednisone -you are given a prescription for a steroid.  It is important that you take this medication with food.  This medication can cause an upset stomach.  It also can increase your glucose if you have a history of diabetes, so it is important that you check your glucose frequently while you are on this medication.  Doxycycline - This medication can cause acid reflux.  It is important that you take it with food and drink plenty of water.  Do not lie down for 1 hour after taking this medication.  It also causes sun sensitivity so stay out of the sun or wear SPF while on this medication.  Thank you for choosing [place] for your health care, it was my pleasure to care for you today!  Corena Herter, MD

## 2022-10-15 NOTE — ED Provider Notes (Signed)
[facility] Provider Note    Event Date/Time   First MD Initiated Contact with Patient 10/15/22 1619     (approximate)   History   Sore Throat   HPI  Justin Reid is a 42 y.o. male past medical history significant for tobacco use, obesity, hypertension, who presents to the emergency department with sore throat and cough.  Patient endorses 2-day history of cough, congestion and not feeling well.  Mild shortness of breath.  No significant chest pain.  No fever or chills.  No swelling to his lower legs.  States that he has been cutting back on smoking.  Has an albuterol inhaler with him.  States that he has not picked up his antihypertensive medication from the pharmacy and he has not followed up with a primary care provider.     Physical Exam   Triage Vital Signs: ED Triage Vitals  Encounter Vitals Group     BP 10/15/22 1514 (!) 156/106     Systolic BP Percentile --      Diastolic BP Percentile --      Pulse Rate 10/15/22 1514 96     Resp 10/15/22 1514 20     Temp 10/15/22 1514 98.5 F (36.9 C)     Temp Source 10/15/22 1514 Oral     SpO2 10/15/22 1514 95 %     Weight 10/15/22 1518 (!) 350 lb 8.5 oz (159 kg)     Height 10/15/22 1518 5\' 11"  (1.803 m)     Head Circumference --      Peak Flow --      Pain Score 10/15/22 1518 3     Pain Loc --      Pain Education --      Exclude from Growth Chart --     Most recent vital signs: Vitals:   10/15/22 1514  BP: (!) 156/106  Pulse: 96  Resp: 20  Temp: 98.5 F (36.9 C)  SpO2: 95%    Physical Exam Constitutional:      Appearance: He is well-developed.  HENT:     Head: Atraumatic.  Eyes:     Conjunctiva/sclera: Conjunctivae normal.  Cardiovascular:     Rate and Rhythm: Regular rhythm.     Heart sounds: No murmur heard. Pulmonary:     Effort: No respiratory distress.     Breath sounds: Wheezing present.  Abdominal:     Tenderness: There is no abdominal tenderness.  Musculoskeletal:      Cervical back: Normal range of motion.  Skin:    General: Skin is warm.     Capillary Refill: Capillary refill takes less than 2 seconds.  Neurological:     General: No focal deficit present.     Mental Status: He is alert. Mental status is at baseline.     IMPRESSION / MDM / ASSESSMENT AND PLAN / ED COURSE  I reviewed the triage vital signs and the nursing notes.  Differential diagnosis including pneumonia, COVID, acute bronchitis  LABS (all labs ordered are listed, but only abnormal results are displayed) Labs interpreted as -    Labs Reviewed  SARS CORONAVIRUS 2 BY RT PCR     MDM    Low suspicion for strep throat.  COVID testing is negative.  Lungs without focal findings consistent with bacterial pneumonia.  Does have diffuse inspiratory and expiratory wheezing throughout all lung fields.  Given his history of tobacco use and recurrent episodes of acute bronchitis most likely has undiagnosed COPD.  Given a prescription for albuterol inhaler and discussed how to use.  Will do a short course of steroids and doxycycline.  Significantly hypertensive on arrival with a blood pressure of 156/100.  On chart review multiple ED visits in the past year and has been hypertensive.  Discussed at length the need to treat his hypertension and to follow-up as an outpatient with a primary care physician and to establish care.  Discussed at length smoking cessation.  Given return precautions for any ongoing or worsening symptoms.  No questions at time of discharge.   PROCEDURES:  Critical Care performed: No  Procedures  Patient's presentation is most consistent with acute presentation with potential threat to life or bodily function.   MEDICATIONS ORDERED IN ED: Medications - No data to display  FINAL CLINICAL IMPRESSION(S) / ED DIAGNOSES   Final diagnoses:  Upper respiratory tract infection, unspecified type  Uncontrolled hypertension  Bronchitis  Encounter for smoking cessation  counseling     Rx / DC Orders   ED Discharge Orders          Ordered    Ambulatory Referral to Primary Care (Establish Care)        10/15/22 1645    albuterol (VENTOLIN HFA) 108 (90 Base) MCG/ACT inhaler  Every 4 hours PRN        10/15/22 1652    amLODipine (NORVASC) 5 MG tablet  Daily        10/15/22 1652    predniSONE (DELTASONE) 50 MG tablet  Daily with breakfast        10/15/22 1652    doxycycline (ADOXA) 100 MG tablet  2 times daily        10/15/22 1652             Note:  This document was prepared using Dragon voice recognition software and may include unintentional dictation errors.   Corena Herter, MD 10/15/22 [phone]

## 2022-11-20 ENCOUNTER — Other Ambulatory Visit: Payer: Self-pay

## 2022-11-20 ENCOUNTER — Emergency Department
Admission: EM | Admit: 2022-11-20 | Discharge: 2022-11-20 | Disposition: A | Discharge status: Home / Self Care | Payer: Self-pay | Attending: Student in an Organized Health Care Education/Training Program | Admitting: Student in an Organized Health Care Education/Training Program

## 2022-11-20 DIAGNOSIS — J45909 Unspecified asthma, uncomplicated: Secondary | ICD-10-CM | POA: Insufficient documentation

## 2022-11-20 DIAGNOSIS — M109 Gout, unspecified: Secondary | ICD-10-CM | POA: Diagnosis not present

## 2022-11-20 LAB — BASIC METABOLIC PANEL
Anion gap: 10 (ref 5–15)
BUN: 12 mg/dL (ref 6–20)
CO2: 23 mmol/L (ref 22–32)
Calcium: 9.1 mg/dL (ref 8.9–10.3)
Chloride: 103 mmol/L (ref 98–111)
Creatinine, Ser: 0.64 mg/dL (ref 0.61–1.24)
GFR, Estimated: >60 mL/min (ref >60)
Glucose, Bld: 113 mg/dL — ABNORMAL HIGH (ref 70–99)
Potassium: 4.2 mmol/L (ref 3.5–5.1)
Sodium: 136 mmol/L (ref 135–145)

## 2022-11-20 LAB — CBC
HCT: 41.7 % (ref 39.0–52.0)
Hemoglobin: 13.4 g/dL (ref 13.0–17.0)
MCH: 27.3 pg (ref 26.0–34.0)
MCHC: 32.1 g/dL (ref 30.0–36.0)
MCV: 84.9 fL (ref 80.0–100.0)
Platelets: 437 10*3/uL — ABNORMAL HIGH (ref 150–400)
RBC: 4.91 MIL/uL (ref 4.22–5.81)
RDW: 14.7 % (ref 11.5–15.5)
WBC: 9 10*3/uL (ref 4.0–10.5)
nRBC: 0 % (ref 0.0–0.2)

## 2022-11-20 LAB — URIC ACID: Uric Acid, Serum: 7.3 mg/dL (ref 3.7–8.6)

## 2022-11-20 MED ORDER — KETOROLAC TROMETHAMINE 15 MG/ML IJ SOLN
15.0000 mg | Freq: Once | INTRAMUSCULAR | Status: AC
  Administered 2022-11-20: 15 mg via INTRAMUSCULAR
  Filled 2022-11-20: qty 1

## 2022-11-20 MED ORDER — PREDNISONE 50 MG PO TABS
ORAL_TABLET | ORAL | 0 refills | Status: DC
Start: 2022-11-20 — End: 2023-01-03

## 2022-11-20 MED ORDER — PREDNISONE 50 MG PO TABS
ORAL_TABLET | ORAL | 0 refills | Status: DC
Start: 2022-11-20 — End: 2022-11-20

## 2022-11-20 NOTE — Discharge Instructions (Addendum)
You may take the medications as prescribed.  Please return for any new, worsening, or change in symptoms or other concerns.  It was a pleasure caring for you today.

## 2022-11-20 NOTE — ED Provider Notes (Signed)
[facility] Provider Note    Event Date/Time   First MD Initiated Contact with Patient 11/20/22 1348     (approximate)   History   Gout   HPI  Justin Reid is a 42 y.o. male with a history of gout who presents today for evaluation of great toe pain that feels exactly the same as his previous gout flares.  Patient reports that this started yesterday, and he had Posta with meat sauce with beef the night before.  No fevers or chills.  No injuries.  He has not noticed any discoloration.  Patient Active Problem List   Diagnosis Date Noted   Influenza A 12/28/2021   Tobacco abuse 12/28/2021   Obesity (BMI 30-39.9) 12/28/2021   OSA (obstructive sleep apnea) 12/28/2021   Hypokalemia due to excessive gastrointestinal loss of potassium 12/04/2020   SOB (shortness of breath) 11/11/2020   Shortness of breath 11/10/2020   At risk for sleep apnea 11/10/2020   Obesity hypoventilation syndrome (HCC) 11/10/2020   Truncal obesity 11/10/2020   2019-nCoV vaccination declined 11/10/2020   Hyperkalemia    Pneumonia due to COVID-19 virus 10/07/2019   Hypokalemia    Hyponatremia    Acute midline thoracic back pain    Asthma 07/07/2016          Physical Exam   Triage Vital Signs: ED Triage Vitals  Encounter Vitals Group     BP 11/20/22 1329 (!) 135/101     Systolic BP Percentile --      Diastolic BP Percentile --      Pulse Rate 11/20/22 1329 89     Resp 11/20/22 1329 20     Temp 11/20/22 1329 98.5 F (36.9 C)     Temp Source 11/20/22 1329 Oral     SpO2 11/20/22 1329 95 %     Weight 11/20/22 1327 (!) 350 lb (158.8 kg)     Height 11/20/22 1327 5\' 11"  (1.803 m)     Head Circumference --      Peak Flow --      Pain Score 11/20/22 1327 10     Pain Loc --      Pain Education --      Exclude from Growth Chart --     Most recent vital signs: Vitals:   11/20/22 1329 11/20/22 1416  BP: (!) 135/101 (!) 149/110  Pulse: 89 75  Resp: 20 20  Temp: 98.5 F  (36.9 C)   SpO2: 95% 98%    Physical Exam Vitals and nursing note reviewed.  Constitutional:      General: Awake and alert. No acute distress.    Appearance: Normal appearance. The patient is normal weight.  HENT:     Head: Normocephalic and atraumatic.     Mouth: Mucous membranes are moist.  Eyes:     General: PERRL. Normal EOMs        Right eye: No discharge.        Left eye: No discharge.     Conjunctiva/sclera: Conjunctivae normal.  Cardiovascular:     Rate and Rhythm: Normal rate and regular rhythm.     Pulses: Normal pulses.  Pulmonary:     Effort: Pulmonary effort is normal. No respiratory distress.     Breath sounds: Normal breath sounds.  Abdominal:     Abdomen is soft. There is no abdominal tenderness. No rebound or guarding. No distention. Musculoskeletal:        General: No swelling. Normal range of motion.  Cervical back: Normal range of motion and neck supple. Right foot: Tenderness at the great toe, though able to range toe, no erythema or warmth.  2+ pedal pulses.  Normal capillary refill.  Sensation intact light touch throughout. Skin:    General: Skin is warm and dry.     Capillary Refill: Capillary refill takes less than 2 seconds.     Findings: No rash.  Neurological:     Mental Status: The patient is awake and alert.      ED Results / Procedures / Treatments   Labs (all labs ordered are listed, but only abnormal results are displayed) Labs Reviewed  CBC - Abnormal; Notable for the following components:      Result Value   Platelets 437 (*)    All other components within normal limits  BASIC METABOLIC PANEL - Abnormal; Notable for the following components:   Glucose, Bld 113 (*)    All other components within normal limits  URIC ACID     EKG     RADIOLOGY     PROCEDURES:  Critical Care performed:   Procedures   MEDICATIONS ORDERED IN ED: Medications  ketorolac (TORADOL) 15 MG/ML injection 15 mg (15 mg Intramuscular Given  11/20/22 1414)     IMPRESSION / MDM / ASSESSMENT AND PLAN / ED COURSE  I reviewed the triage vital signs and the nursing notes.   Differential diagnosis includes, but is not limited to, gout, effusion, arthritis, contusion, fracture, ligamental injury.  Do not suspect septic joint as he is able to range his joint, has no warmth or erythema to his joint, no fevers or chills. No evidence of neurological deficit or vascular compromise on exam, 2+ pedal pulses, normal capillary refill, foot is warm and well-perfused. No trauma to suggest fracture/dislocation. No deformity or obvious ligamentous laxity on exam.  No constitutional symptoms or effusion to suggest septic joint. No history of immunosuppression.  Patient has had gout in this location before and he reports that it feels exactly the same.  He was advised of the foods to stay away from that will help prevent gout flares in the future.  The beef that he had the night before the symptom onset was likely the source of his gout flare today.  Overall well appearing, vital signs stable. Return precautions and care instructions discussed. Outpatient follow-up advised. Patient agrees with plan of care.   Patient's presentation is most consistent with acute complicated illness / injury requiring diagnostic workup.   FINAL CLINICAL IMPRESSION(S) / ED DIAGNOSES   Final diagnoses:  Podagra     Rx / DC Orders   ED Discharge Orders          Ordered    predniSONE (DELTASONE) 50 MG tablet  Status:  Discontinued        11/20/22 1403    predniSONE (DELTASONE) 50 MG tablet        11/20/22 1434             Note:  This document was prepared using Dragon voice recognition software and may include unintentional dictation errors.   Jackelyn Hoehn, PA-C 11/20/22 1438    Willy Eddy, MD 11/20/22 1537

## 2022-11-20 NOTE — ED Triage Notes (Signed)
Pt to ED for possible gout to right big toe. Reports hx of same.

## 2023-01-03 ENCOUNTER — Other Ambulatory Visit: Payer: Self-pay

## 2023-01-03 ENCOUNTER — Emergency Department: Payer: Self-pay

## 2023-01-03 ENCOUNTER — Emergency Department
Admission: EM | Admit: 2023-01-03 | Discharge: 2023-01-03 | Disposition: A | Discharge status: Home / Self Care | Payer: Self-pay | Attending: Emergency Medicine | Admitting: Emergency Medicine

## 2023-01-03 DIAGNOSIS — R0602 Shortness of breath: Secondary | ICD-10-CM | POA: Diagnosis not present

## 2023-01-03 DIAGNOSIS — J45901 Unspecified asthma with (acute) exacerbation: Secondary | ICD-10-CM | POA: Insufficient documentation

## 2023-01-03 DIAGNOSIS — Z20822 Contact with and (suspected) exposure to covid-19: Secondary | ICD-10-CM | POA: Insufficient documentation

## 2023-01-03 LAB — CBC
HCT: 38 % — ABNORMAL LOW (ref 39.0–52.0)
Hemoglobin: 13 g/dL (ref 13.0–17.0)
MCH: 27.5 pg (ref 26.0–34.0)
MCHC: 34.2 g/dL (ref 30.0–36.0)
MCV: 80.5 fL (ref 80.0–100.0)
Platelets: 446 10*3/uL — ABNORMAL HIGH (ref 150–400)
RBC: 4.72 MIL/uL (ref 4.22–5.81)
RDW: 13.4 % (ref 11.5–15.5)
WBC: 9.2 10*3/uL (ref 4.0–10.5)
nRBC: 0 % (ref 0.0–0.2)

## 2023-01-03 LAB — BASIC METABOLIC PANEL
Anion gap: 12 (ref 5–15)
BUN: 19 mg/dL (ref 6–20)
CO2: 24 mmol/L (ref 22–32)
Calcium: 9.3 mg/dL (ref 8.9–10.3)
Chloride: 94 mmol/L — ABNORMAL LOW (ref 98–111)
Creatinine, Ser: 0.78 mg/dL (ref 0.61–1.24)
GFR, Estimated: >60 mL/min (ref >60)
Glucose, Bld: 108 mg/dL — ABNORMAL HIGH (ref 70–99)
Potassium: 3.8 mmol/L (ref 3.5–5.1)
Sodium: 130 mmol/L — ABNORMAL LOW (ref 135–145)

## 2023-01-03 LAB — TROPONIN I (HIGH SENSITIVITY)
Troponin I (High Sensitivity): 6 ng/L (ref <18)
Troponin I (High Sensitivity): 6 ng/L (ref <18)

## 2023-01-03 LAB — RESP PANEL BY RT-PCR (RSV, FLU A&B, COVID)  RVPGX2
Influenza A by PCR: NEGATIVE
Influenza B by PCR: NEGATIVE
Resp Syncytial Virus by PCR: NEGATIVE
SARS Coronavirus 2 by RT PCR: NEGATIVE

## 2023-01-03 MED ORDER — AZITHROMYCIN 250 MG PO TABS: ORAL_TABLET | ORAL | 0 refills | Status: AC

## 2023-01-03 MED ORDER — METHYLPREDNISOLONE SODIUM SUCC 125 MG IJ SOLR
125.0000 mg | Freq: Once | INTRAMUSCULAR | Status: AC
  Administered 2023-01-03: 125 mg via INTRAVENOUS
  Filled 2023-01-03: qty 2

## 2023-01-03 MED ORDER — ALBUTEROL SULFATE (2.5 MG/3ML) 0.083% IN NEBU: 2.5000 mg | INHALATION_SOLUTION | Freq: Once | RESPIRATORY_TRACT | Status: DC

## 2023-01-03 MED ORDER — IPRATROPIUM-ALBUTEROL 0.5-2.5 (3) MG/3ML IN SOLN
3.0000 mL | Freq: Once | RESPIRATORY_TRACT | Status: AC
  Administered 2023-01-03: 3 mL via RESPIRATORY_TRACT
  Filled 2023-01-03: qty 3

## 2023-01-03 MED ORDER — PREDNISONE 20 MG PO TABS: 40.0000 mg | ORAL_TABLET | Freq: Every day | ORAL | 0 refills | Status: AC

## 2023-01-03 MED ORDER — ACETAMINOPHEN 500 MG PO TABS
1000.0000 mg | ORAL_TABLET | Freq: Once | ORAL | Status: AC
  Administered 2023-01-03: 1000 mg via ORAL
  Filled 2023-01-03: qty 2

## 2023-01-03 MED ORDER — KETOROLAC TROMETHAMINE 15 MG/ML IJ SOLN
15.0000 mg | Freq: Once | INTRAMUSCULAR | Status: AC
  Administered 2023-01-03: 15 mg via INTRAVENOUS
  Filled 2023-01-03: qty 1

## 2023-01-03 MED ORDER — IOHEXOL 350 MG/ML SOLN
100.0000 mL | Freq: Once | INTRAVENOUS | Status: AC | PRN
  Administered 2023-01-03: 100 mL via INTRAVENOUS

## 2023-01-03 MED ORDER — ALBUTEROL SULFATE HFA 108 (90 BASE) MCG/ACT IN AERS
2.0000 | INHALATION_SPRAY | Freq: Four times a day (QID) | RESPIRATORY_TRACT | Status: DC | PRN
  Administered 2023-01-03: 2 via RESPIRATORY_TRACT
  Filled 2023-01-03: qty 6.7

## 2023-01-03 MED ORDER — SODIUM CHLORIDE 0.9 % IV BOLUS
1000.0000 mL | Freq: Once | INTRAVENOUS | Status: AC
  Administered 2023-01-03: 1000 mL via INTRAVENOUS

## 2023-01-03 NOTE — ED Provider Notes (Signed)
[facility] Provider Note    Event Date/Time   First MD Initiated Contact with Patient 01/03/23 1031     (approximate)   History   Shortness of Breath and Chest Pain   HPI  Justin Reid is a 42 y.o. male with history of asthma who comes in with concerns for chest pain, shortness of breath.  Patient did take some leftover prednisone from a prior ER visit.  Patient reports he woke up this morning with chest pain that hurts when he takes a deep breath as well as shortness of breath.  He reports a little bit of congestion but he denies any significant cough that is new for him.  He states that he just not feel well at all.    Physical Exam   Triage Vital Signs: ED Triage Vitals  Encounter Vitals Group     BP 01/03/23 0924 (!) 140/84     Systolic BP Percentile --      Diastolic BP Percentile --      Pulse Rate 01/03/23 0924 (!) 105     Resp 01/03/23 0924 20     Temp 01/03/23 0924 99.7 F (37.6 C)     Temp src --      SpO2 01/03/23 0924 91 %     Weight 01/03/23 0925 (!) 350 lb (158.8 kg)     Height 01/03/23 0925 5\' 10"  (1.778 m)     Head Circumference --      Peak Flow --      Pain Score 01/03/23 0924 8     Pain Loc --      Pain Education --      Exclude from Growth Chart --     Most recent vital signs: Vitals:   01/03/23 0924  BP: (!) 140/84  Pulse: (!) 105  Resp: 20  Temp: 99.7 F (37.6 C)  SpO2: 91%     General: Awake, no distress.  CV:  Good peripheral perfusion.  Resp:  Normal effort.  Slight wheeze  Abd:  No distention.  Other:  No swelling in legs.  No calf tenderness   ED Results / Procedures / Treatments   Labs (all labs ordered are listed, but only abnormal results are displayed) Labs Reviewed  BASIC METABOLIC PANEL - Abnormal; Notable for the following components:      Result Value   Sodium 130 (*)    Chloride 94 (*)    Glucose, Bld 108 (*)    All other components within normal limits  CBC - Abnormal; Notable  for the following components:   HCT 38.0 (*)    Platelets 446 (*)    All other components within normal limits  RESP PANEL BY RT-PCR (RSV, FLU A&B, COVID)  RVPGX2  TROPONIN I (HIGH SENSITIVITY)     EKG  My interpretation of EKG:  Sinus tachycardia rate 106 that any ST elevation or T wave inversions, normal intervals   RADIOLOGY I have reviewed the xray personally and interpreted and no evidence of any pneumonia  PROCEDURES:  Critical Care performed: No  Procedures   MEDICATIONS ORDERED IN ED: Medications  ipratropium-albuterol (DUONEB) 0.5-2.5 (3) MG/3ML nebulizer solution 3 mL (3 mLs Nebulization Given 01/03/23 1056)  methylPREDNISolone sodium succinate (SOLU-MEDROL) 125 mg/2 mL injection 125 mg (125 mg Intravenous Given 01/03/23 1056)  sodium chloride 0.9 % bolus 1,000 mL (1,000 mLs Intravenous New Bag/Given 01/03/23 1120)  ketorolac (TORADOL) 15 MG/ML injection 15 mg (15 mg Intravenous Given 01/03/23  1056)  acetaminophen (TYLENOL) tablet 1,000 mg (1,000 mg Oral Given 01/03/23 1056)  iohexol (OMNIPAQUE) 350 MG/ML injection 100 mL (100 mLs Intravenous Contrast Given 01/03/23 1105)     IMPRESSION / MDM / ASSESSMENT AND PLAN / ED COURSE  I reviewed the triage vital signs and the nursing notes.   Patient's presentation is most consistent with acute presentation with potential threat to life or bodily function.   Differential is asthma exacerbation , ACS< PE  Will get CT imaging given his D-dimers have previously been elevated to evaluate for pulmonary embolism, pneumonia being missed on x-ray, cardiac effusion or other acute pathology.  Will treat for asthma while awaiting results.  COVID, flu, RSV are negative.  BMP shows slightly low sodium and chloride.  CBC shows normal white count troponin was negative  Repeat troponin is negative repeat CT imaging  IMPRESSION: 1. No segmental or larger pulmonary embolus. 2. No acute vascular or nonvascular intrathoracic  abnormality. Additional incidental, chronic and senescent findings as above.    Patient reports feeling much better.  Considered admission but patient is not hypoxic and amatory sat is normal.  He feels much improved.  He feels comfortable with discharge home with treatment for asthma exacerbation.   FINAL CLINICAL IMPRESSION(S) / ED DIAGNOSES   Final diagnoses:  Moderate asthma with exacerbation, unspecified whether persistent     Rx / DC Orders   ED Discharge Orders     None        Note:  This document was prepared using Dragon voice recognition software and may include unintentional dictation errors.   Concha Se, MD 01/03/23 [phone]

## 2023-01-03 NOTE — ED Triage Notes (Addendum)
Pt comes via EMS from home with c/o sob and cp that started this morning. Pt does have hx of asthma. Pt given 1 duoneb by ems. VSS  Pt did take Asprin before EMS got there. Pt states he did use his inhaler with no relief.   Pt has labored breathing and accessory muscle use noted. Pt also has audible wheezing.

## 2023-01-03 NOTE — ED Notes (Signed)
Walked pt around unit for ambulatory sats per MD. Pt stayed in the range of 96-99% with no complaints of SHOB or feeling lightheaded- no complaints. MD notified

## 2023-01-03 NOTE — Discharge Instructions (Addendum)
Take steroids and antibiotics for your asthma flare- Return for worsening symptoms or any other concerns.

## 2023-01-03 NOTE — ED Provider Triage Note (Signed)
Emergency Medicine Provider Triage Evaluation Note  Justin Reid , a 42 y.o. male  was evaluated in triage.  Pt complains of chest pain with sob, asthma.  Took left over prednisone 2 tablets at home.  Didn't take prednisone from ER correctly last time seen.  Out of albuterol inhaler this am.  Review of Systems  Positive: Asthma. CP, SOB Negative: No fever, chills  Physical Exam  BP (!) 140/84   Pulse (!) 105   Temp 99.7 F (37.6 C)   Resp 20   Ht 5\' 10"  (1.778 m)   Wt (!) 158.8 kg   SpO2 91%   BMI 50.22 kg/m  Gen:   Awake, no distress   Resp:  Wheezing bilaterally, able to talk without difficulty.  MSK:   Moves extremities without difficulty  Other:    Medical Decision Making  Medically screening exam initiated at 9:26 AM.  Appropriate orders placed.  Justin Reid was informed that the remainder of the evaluation will be completed by another provider, this initial triage assessment does not replace that evaluation, and the importance of remaining in the ED until their evaluation is complete.     Tommi Rumps, PA-C 01/03/23 [phone]

## 2023-01-03 NOTE — ED Notes (Signed)
Patient transported to CT 

## 2023-01-20 ENCOUNTER — Emergency Department: Payer: Self-pay

## 2023-01-20 ENCOUNTER — Other Ambulatory Visit: Payer: Self-pay

## 2023-01-20 ENCOUNTER — Inpatient Hospital Stay
Admission: EM | Admit: 2023-01-20 | Discharge: 2023-01-21 | DRG: 189 | Disposition: A | Discharge status: Home / Self Care | Payer: Self-pay | Attending: Internal Medicine | Admitting: Internal Medicine

## 2023-01-20 DIAGNOSIS — J45901 Unspecified asthma with (acute) exacerbation: Secondary | ICD-10-CM | POA: Diagnosis not present

## 2023-01-20 DIAGNOSIS — Z8249 Family history of ischemic heart disease and other diseases of the circulatory system: Secondary | ICD-10-CM

## 2023-01-20 DIAGNOSIS — J4551 Severe persistent asthma with (acute) exacerbation: Secondary | ICD-10-CM | POA: Diagnosis present

## 2023-01-20 DIAGNOSIS — Z720 Tobacco use: Secondary | ICD-10-CM | POA: Diagnosis present

## 2023-01-20 DIAGNOSIS — Z716 Tobacco abuse counseling: Secondary | ICD-10-CM

## 2023-01-20 DIAGNOSIS — F17210 Nicotine dependence, cigarettes, uncomplicated: Secondary | ICD-10-CM | POA: Diagnosis present

## 2023-01-20 DIAGNOSIS — G4733 Obstructive sleep apnea (adult) (pediatric): Secondary | ICD-10-CM | POA: Diagnosis present

## 2023-01-20 DIAGNOSIS — Z1152 Encounter for screening for COVID-19: Secondary | ICD-10-CM

## 2023-01-20 DIAGNOSIS — Z79899 Other long term (current) drug therapy: Secondary | ICD-10-CM

## 2023-01-20 DIAGNOSIS — F17200 Nicotine dependence, unspecified, uncomplicated: Secondary | ICD-10-CM | POA: Diagnosis present

## 2023-01-20 DIAGNOSIS — E669 Obesity, unspecified: Secondary | ICD-10-CM | POA: Diagnosis present

## 2023-01-20 DIAGNOSIS — K5900 Constipation, unspecified: Secondary | ICD-10-CM | POA: Diagnosis present

## 2023-01-20 DIAGNOSIS — J9601 Acute respiratory failure with hypoxia: Principal | ICD-10-CM | POA: Diagnosis present

## 2023-01-20 DIAGNOSIS — Z6836 Body mass index (BMI) 36.0-36.9, adult: Secondary | ICD-10-CM

## 2023-01-20 DIAGNOSIS — I10 Essential (primary) hypertension: Secondary | ICD-10-CM

## 2023-01-20 LAB — BASIC METABOLIC PANEL
Anion gap: 11 (ref 5–15)
BUN: 15 mg/dL (ref 6–20)
CO2: 25 mmol/L (ref 22–32)
Calcium: 8.9 mg/dL (ref 8.9–10.3)
Chloride: 99 mmol/L (ref 98–111)
Creatinine, Ser: 0.87 mg/dL (ref 0.61–1.24)
GFR, Estimated: >60 mL/min (ref >60)
Glucose, Bld: 138 mg/dL — ABNORMAL HIGH (ref 70–99)
Potassium: 3.4 mmol/L — ABNORMAL LOW (ref 3.5–5.1)
Sodium: 135 mmol/L (ref 135–145)

## 2023-01-20 LAB — CBC WITH DIFFERENTIAL/PLATELET
Abs Immature Granulocytes: 0.03 10*3/uL (ref 0.00–0.07)
Basophils Absolute: 0.1 10*3/uL (ref 0.0–0.1)
Basophils Relative: 1 %
Eosinophils Absolute: 0.4 10*3/uL (ref 0.0–0.5)
Eosinophils Relative: 4 %
HCT: 40.8 % (ref 39.0–52.0)
Hemoglobin: 13.2 g/dL (ref 13.0–17.0)
Immature Granulocytes: 0 %
Lymphocytes Relative: 40 %
Lymphs Abs: 4.1 10*3/uL — ABNORMAL HIGH (ref 0.7–4.0)
MCH: 27.8 pg (ref 26.0–34.0)
MCHC: 32.4 g/dL (ref 30.0–36.0)
MCV: 85.9 fL (ref 80.0–100.0)
Monocytes Absolute: 0.6 10*3/uL (ref 0.1–1.0)
Monocytes Relative: 6 %
Neutro Abs: 5 10*3/uL (ref 1.7–7.7)
Neutrophils Relative %: 49 %
Platelets: 422 10*3/uL — ABNORMAL HIGH (ref 150–400)
RBC: 4.75 MIL/uL (ref 4.22–5.81)
RDW: 13.4 % (ref 11.5–15.5)
WBC: 10.2 10*3/uL (ref 4.0–10.5)
nRBC: 0 % (ref 0.0–0.2)

## 2023-01-20 LAB — HIV ANTIBODY (ROUTINE TESTING W REFLEX): HIV Screen 4th Generation wRfx: NONREACTIVE

## 2023-01-20 LAB — BLOOD GAS, VENOUS
Acid-Base Excess: 1.1 mmol/L (ref 0.0–2.0)
Bicarbonate: 25.1 mmol/L (ref 20.0–28.0)
O2 Saturation: 97.2 %
Patient temperature: 37
pCO2, Ven: 37 mm[Hg] — ABNORMAL LOW (ref 44–60)
pH, Ven: 7.44 — ABNORMAL HIGH (ref 7.25–7.43)
pO2, Ven: 77 mm[Hg] — ABNORMAL HIGH (ref 32–45)

## 2023-01-20 LAB — TROPONIN I (HIGH SENSITIVITY)
Troponin I (High Sensitivity): 6 ng/L (ref <18)
Troponin I (High Sensitivity): 6 ng/L (ref <18)

## 2023-01-20 LAB — RESP PANEL BY RT-PCR (RSV, FLU A&B, COVID)  RVPGX2
Influenza A by PCR: NEGATIVE
Influenza B by PCR: NEGATIVE
Resp Syncytial Virus by PCR: NEGATIVE
SARS Coronavirus 2 by RT PCR: NEGATIVE

## 2023-01-20 LAB — BRAIN NATRIURETIC PEPTIDE: B Natriuretic Peptide: 28.9 pg/mL (ref 0.0–100.0)

## 2023-01-20 MED ORDER — ENOXAPARIN SODIUM 40 MG/0.4ML IJ SOSY: 40.0000 mg | PREFILLED_SYRINGE | INTRAMUSCULAR | Status: DC

## 2023-01-20 MED ORDER — IPRATROPIUM-ALBUTEROL 0.5-2.5 (3) MG/3ML IN SOLN
3.0000 mL | Freq: Four times a day (QID) | RESPIRATORY_TRACT | Status: DC
  Administered 2023-01-20 – 2023-01-21 (×3): 3 mL via RESPIRATORY_TRACT
  Filled 2023-01-20 (×3): qty 3

## 2023-01-20 MED ORDER — IPRATROPIUM-ALBUTEROL 0.5-2.5 (3) MG/3ML IN SOLN
9.0000 mL | Freq: Once | RESPIRATORY_TRACT | Status: AC
  Administered 2023-01-20: 9 mL via RESPIRATORY_TRACT
  Filled 2023-01-20: qty 9

## 2023-01-20 MED ORDER — ONDANSETRON HCL 4 MG/2ML IJ SOLN: 4.0000 mg | Freq: Four times a day (QID) | INTRAMUSCULAR | Status: DC | PRN

## 2023-01-20 MED ORDER — ENOXAPARIN SODIUM 60 MG/0.6ML IJ SOSY
0.5000 mg/kg | PREFILLED_SYRINGE | INTRAMUSCULAR | Status: DC
  Administered 2023-01-20: 57.5 mg via SUBCUTANEOUS
  Filled 2023-01-20: qty 0.6

## 2023-01-20 MED ORDER — PREDNISONE 20 MG PO TABS
40.0000 mg | ORAL_TABLET | Freq: Every day | ORAL | Status: DC
Start: 2023-01-22 — End: 2023-01-21

## 2023-01-20 MED ORDER — ALBUTEROL SULFATE (2.5 MG/3ML) 0.083% IN NEBU: 2.5000 mg | INHALATION_SOLUTION | RESPIRATORY_TRACT | Status: DC | PRN

## 2023-01-20 MED ORDER — ONDANSETRON HCL 4 MG PO TABS: 4.0000 mg | ORAL_TABLET | Freq: Four times a day (QID) | ORAL | Status: DC | PRN

## 2023-01-20 MED ORDER — NICOTINE 7 MG/24HR TD PT24
7.0000 mg | MEDICATED_PATCH | Freq: Every day | TRANSDERMAL | Status: DC
  Administered 2023-01-20: 7 mg via TRANSDERMAL
  Filled 2023-01-20 (×2): qty 1

## 2023-01-20 MED ORDER — AMLODIPINE BESYLATE 5 MG PO TABS
2.5000 mg | ORAL_TABLET | Freq: Every day | ORAL | Status: DC
  Administered 2023-01-20: 2.5 mg via ORAL
  Filled 2023-01-20: qty 1

## 2023-01-20 MED ORDER — ACETAMINOPHEN 500 MG PO TABS
1000.0000 mg | ORAL_TABLET | Freq: Four times a day (QID) | ORAL | Status: DC | PRN
  Administered 2023-01-20: 1000 mg via ORAL
  Filled 2023-01-20: qty 2

## 2023-01-20 MED ORDER — METHYLPREDNISOLONE SODIUM SUCC 40 MG IJ SOLR
40.0000 mg | Freq: Two times a day (BID) | INTRAMUSCULAR | Status: AC
  Administered 2023-01-20 – 2023-01-21 (×2): 40 mg via INTRAVENOUS
  Filled 2023-01-20 (×2): qty 1

## 2023-01-20 NOTE — Assessment & Plan Note (Addendum)
01-21-2023 started norvasc. Will DC to home with 5 mg daily. Will need new PCP for HTN med titration.

## 2023-01-20 NOTE — Progress Notes (Signed)
BIPAP TAKEN OFF OF PATIENT C/O SOB WITH BIPAP ON. RT PLACED PT. ON 4L [place],RR 23,SAT 100,HR 89.

## 2023-01-20 NOTE — Assessment & Plan Note (Addendum)
01-21-2023 advised to quit smoking altogether. If he cannot, at least no smoking while asthma exacerbation on-going.

## 2023-01-20 NOTE — ED Provider Notes (Signed)
[facility] Provider Note    Event Date/Time   First MD Initiated Contact with Patient 01/20/23 1252     (approximate)   History   Chief Complaint: Shortness of Breath   HPI  Justin Reid is a 43 y.o. male who reports being in his usual state of health until about 11:30 AM today when he became short of breath and wheezy, feels like asthma attack.  Reports that he normally uses albuterol for his asthma, but this did not help today.  Is not on any daily controller medications.  He does report having been hospitalized and even intubated in the past for asthma.  Denies any recent illness.  Denies any food or medication allergies.  EMS report on their arrival the patient was in respiratory distress with accessory muscle use and oxygen saturation of 80% on room air.  They started CPAP, gave 2 doses of IM epinephrine, Solu-Medrol 125 mg, 10 mg albuterol, 2 g magnesium.  Patient reports that he does feel better but still short of breath.          Physical Exam   Triage Vital Signs: ED Triage Vitals  Encounter Vitals Group     BP      Systolic BP Percentile      Diastolic BP Percentile      Pulse      Resp      Temp      Temp src      SpO2      Weight      Height      Head Circumference      Peak Flow      Pain Score      Pain Loc      Pain Education      Exclude from Growth Chart     Most recent vital signs: Vitals:   01/20/23 1253  BP: (!) 155/96  Pulse: (!) 108  Resp: 18  Temp: 97.6 F (36.4 C)  SpO2: 100%    General: Awake, moderate respiratory distress CV:  Good peripheral perfusion.  Tachycardia heart rate 110 Resp:  Tachypnea, accessory muscle use.  Symmetric air entry in all lung fields.  There is expiratory wheezing and prolonged expiratory phase Abd:  No distention.  Soft nontender Other:  No lower extremity edema or calf tenderness   ED Results / Procedures / Treatments   Labs (all labs ordered are listed, but only  abnormal results are displayed) Labs Reviewed  BASIC METABOLIC PANEL - Abnormal; Notable for the following components:      Result Value   Potassium 3.4 (*)    Glucose, Bld 138 (*)    All other components within normal limits  CBC WITH DIFFERENTIAL/PLATELET - Abnormal; Notable for the following components:   Platelets 422 (*)    Lymphs Abs 4.1 (*)    All other components within normal limits  BLOOD GAS, VENOUS - Abnormal; Notable for the following components:   pH, Ven 7.44 (*)    pCO2, Ven 37 (*)    pO2, Ven 77 (*)    All other components within normal limits  RESP PANEL BY RT-PCR (RSV, FLU A&B, COVID)  RVPGX2  BRAIN NATRIURETIC PEPTIDE  TROPONIN I (HIGH SENSITIVITY)     EKG Interpreted by me Sinus tachycardia rate 108.  Normal axis, mildly prolonged QTc of 511 ms.  Normal QRS ST segments and T waves.   RADIOLOGY Chest x-ray interpreted by me, unremarkable.  Radiology report  reviewed   PROCEDURES:  .Critical Care  Performed by: Sharman Cheek, MD Authorized by: Sharman Cheek, MD   Critical care provider statement:    Critical care time (minutes):  35   Critical care time was exclusive of:  Separately billable procedures and treating other patients   Critical care was necessary to treat or prevent imminent or life-threatening deterioration of the following conditions:  Respiratory failure   Critical care was time spent personally by me on the following activities:  Development of treatment plan with patient or surrogate, discussions with consultants, evaluation of patient's response to treatment, examination of patient, obtaining history from patient or surrogate, ordering and performing treatments and interventions, ordering and review of laboratory studies, ordering and review of radiographic studies, pulse oximetry, re-evaluation of patient's condition and review of old charts   Care discussed with: admitting provider      MEDICATIONS ORDERED IN  ED: Medications  ipratropium-albuterol (DUONEB) 0.5-2.5 (3) MG/3ML nebulizer solution 9 mL (9 mLs Nebulization Given 01/20/23 1302)     IMPRESSION / MDM / ASSESSMENT AND PLAN / ED COURSE  I reviewed the triage vital signs and the nursing notes.  DDx: Asthma exacerbation, pneumothorax, pneumonia, influenza, COVID, non-STEMI, pulmonary embolism  Patient's presentation is most consistent with acute presentation with potential threat to life or bodily function.  Patient presents with respiratory distress with wheezing and prolonged expiratory phase highly suspicious for asthma exacerbation.  Already received multiple medications from EMS along with CPAP during transport.  He does report some improvement though still exhibits a degree of respiratory distress.  Will continue on BiPAP, give additional DuoNebs.  Check labs and chest x-ray.  Will need to admit   Clinical Course as of 01/20/23 1402  Thu Jan 20, 2023  1343 Wob improved. On NRB currently. Still has persistent wheeze but not in distress. Will admit  [PS]    Clinical Course User Index [PS] Sharman Cheek, MD    ----------------------------------------- 2:02 PM on 01/20/2023 ----------------------------------------- Case discussed with hospitalist   FINAL CLINICAL IMPRESSION(S) / ED DIAGNOSES   Final diagnoses:  Severe asthma with exacerbation, unspecified whether persistent  Acute respiratory failure with hypoxia (HCC)     Rx / DC Orders   ED Discharge Orders     None        Note:  This document was prepared using Dragon voice recognition software and may include unintentional dictation errors.   Sharman Cheek, MD 01/20/23 [phone]

## 2023-01-20 NOTE — Assessment & Plan Note (Addendum)
01-21-2023 now on RA. No more wheezing. Will need new PCP for asthma management. Pt not using any controller meds at home. Only using albuterol inhaler each day. Will need home nebulizer machine. 7 days of prednisone. Prn albuterol inhaler. Will need to find cheapest inhaler corticosteroid.

## 2023-01-20 NOTE — Progress Notes (Signed)
PHARMACIST - PHYSICIAN COMMUNICATION  CONCERNING:  Enoxaparin (Lovenox) for DVT Prophylaxis    RECOMMENDATION: Patient was prescribed enoxaprin 40mg  q24 hours for VTE prophylaxis.   Filed Weights   01/20/23 1257  Weight: 113.4 kg (250 lb)    Body mass index is 35.87 kg/m.  Estimated Creatinine Clearance: 141 mL/min (by C-G formula based on SCr of 0.87 mg/dL).   Based on [facility] policy patient is candidate for enoxaparin 0.5mg /kg TBW SQ every 24 hours based on BMI being >30.  DESCRIPTION: Pharmacy has adjusted enoxaparin dose per [facility] policy.  Patient is now receiving enoxaparin 57.5 mg every 24 hours    Foye Deer, PharmD Clinical Pharmacist  01/20/2023 2:06 PM

## 2023-01-20 NOTE — ED Notes (Signed)
Blanket and recliner provided to family at the bedside. Patient provided ice water.

## 2023-01-20 NOTE — Assessment & Plan Note (Signed)
BMI 36  Discussed diet and lifestyle modification

## 2023-01-20 NOTE — ED Triage Notes (Signed)
Pt arrives via ems from home, emergency traffic, pt started having difficulty breathing 2 hours ago, hx of asthma, pt was placed on cpap by ems who also gave 0.3mg  IM of epinephrine twice, 125mg  solumedrol, continuous duoneb treatment, and 2 grams of magnesium. Pt states that he is feeling better at this time, work of breathing is unlabored at this time, ems reported wheezing initially with a bp of 110/60 and then lack of breath sounds en route with a bp of 150/90 post epi administration

## 2023-01-20 NOTE — H&P (Addendum)
History and Physical    Patient: Justin Reid UJW:[id number] DOB: 09-13-1980 DOA: 01/20/2023 DOS: the patient was seen and examined on 01/20/2023 PCP: Pcp, No  Patient coming from: Home  Chief Complaint:  Chief Complaint  Patient presents with   Shortness of Breath   HPI: Justin Reid is a 43 y.o. male with medical history significant of obesity, hypertension, asthma, OHS presenting with acute respiratory failure with hypoxia, asthma exacerbation.  Patient reports a sudden onset of acute shortness of breath wheezing since this morning.  No sick contacts.  No fevers or chills.  Baseline 1/4 pack/day smoker.Baseline hypertension not on medications.  No nausea or vomiting.  He denies any illicit drug use including marijuana.  Occasional alcohol use.  Mild constipation symptoms.  No orthopnea PND.  No lower extremity swelling. Presented to the ER afebrile, heart rate 100s, BP stable.  Initially on BiPAP, transition to 4 L nasal cannula to keep O2 sats at 100%.  White count 10.2, hemoglobin 13.2, platelets 422, VBG stable, COVID flu and RSV negative.  Creatinine 0.9.  BNP 29.  Chest x-ray within normal limits. Review of Systems: As mentioned in the history of present illness. All other systems reviewed and are negative. Past Medical History:  Diagnosis Date   Arthritis    Asthma    Hypertension    Shingles    No past surgical history on file. Social History:  reports that he has been smoking cigarettes. He has a 2.5 pack-year smoking history. He has never used smokeless tobacco. He reports current alcohol use. He reports that he does not use drugs.  No Known Allergies  Family History  Problem Relation Age of Onset   Hypertension Father     Prior to Admission medications   Medication Sig Start Date End Date Taking? Authorizing Provider  albuterol (VENTOLIN HFA) 108 (90 Base) MCG/ACT inhaler Inhale 4 puffs into the lungs every 4 (four) hours as needed for wheezing or shortness of  breath. 10/15/22  Yes Corena Herter, MD    Physical Exam: Vitals:   01/20/23 1253 01/20/23 1257 01/20/23 1520  BP: (!) 155/96  (!) 150/92  Pulse: (!) 108  (!) 108  Resp: 18  (!) 22  Temp: 97.6 F (36.4 C)  98.4 F (36.9 C)  TempSrc: Axillary  Oral  SpO2: 100%  100%  Weight:  113.4 kg   Height:  5\' 10"  (1.778 m)    Physical Exam Constitutional:      Appearance: He is obese.  HENT:     Head: Normocephalic and atraumatic.     Nose: Nose normal.     Mouth/Throat:     Mouth: Mucous membranes are moist.  Eyes:     Pupils: Pupils are equal, round, and reactive to light.  Cardiovascular:     Rate and Rhythm: Normal rate and regular rhythm.  Pulmonary:     Effort: Pulmonary effort is normal.     Breath sounds: Wheezing present.  Abdominal:     General: Bowel sounds are normal.  Musculoskeletal:        General: Normal range of motion.  Skin:    General: Skin is warm.  Neurological:     General: No focal deficit present.  Psychiatric:        Mood and Affect: Mood normal.     Data Reviewed:  There are no new results to review at this time.  DG Chest Portable 1 View CLINICAL DATA:  Wheezing, respiratory distress.  EXAM: PORTABLE  CHEST 1 VIEW  COMPARISON:  January 03, 2023.  FINDINGS: The heart size and mediastinal contours are within normal limits. Both lungs are clear. The visualized skeletal structures are unremarkable.  IMPRESSION: No active disease.  Electronically Signed   By: Lupita Raider M.D.   On: 01/20/2023 14:05  Lab Results  Component Value Date   WBC 10.2 01/20/2023   HGB 13.2 01/20/2023   HCT 40.8 01/20/2023   MCV 85.9 01/20/2023   PLT 422 (H) 01/20/2023   Last metabolic panel Lab Results  Component Value Date   GLUCOSE 138 (H) 01/20/2023   NA 135 01/20/2023   K 3.4 (L) 01/20/2023   CL 99 01/20/2023   CO2 25 01/20/2023   BUN 15 01/20/2023   CREATININE 0.87 01/20/2023   GFRNONAA >60 01/20/2023   CALCIUM 8.9 01/20/2023   PHOS  3.8 11/12/2020   PROT 7.6 07/13/2022   ALBUMIN 3.9 07/13/2022   BILITOT 0.9 07/13/2022   ALKPHOS 56 07/13/2022   AST 19 07/13/2022   ALT 21 07/13/2022   ANIONGAP 11 01/20/2023    Assessment and Plan: Asthma exacerbation Acute respiratory failure w/ hypoxia Decompensated resp failure initially requiring BIPAP, now on 4L [place] in setting of asthma exacerbation, morbid obesity, OHS  Covid, flu, RSV negative  CXR WNL  + tobacco abuse  S/p IV solumedrol, CAT, Mg  Cont steroid and breathing treatment  Discussed tobacco cessation and lifestyle modification  CPAP prn  Monitor   Tobacco abuse 1/2 PPD smoker  Discussed cessation Nicotine patch  Follow    OSA (obstructive sleep apnea) CPAP  Obesity (BMI 30-39.9) BMI 36  Discussed diet and lifestyle modification    HTN (hypertension) BP 150s/90s  Not on medication  Titrate BP regimen  Discussed importance of medical compliance        Advance Care Planning:   Code Status: Full Code   Consults: None   Family Communication: Family at the bedside   Severity of Illness: The appropriate patient status for this patient is INPATIENT. Inpatient status is judged to be reasonable and necessary in order to provide the required intensity of service to ensure the patient's safety. The patient's presenting symptoms, physical exam findings, and initial radiographic and laboratory data in the context of their chronic comorbidities is felt to place them at high risk for further clinical deterioration. Furthermore, it is not anticipated that the patient will be medically stable for discharge from the hospital within 2 midnights of admission.   * I certify that at the point of admission it is my clinical judgment that the patient will require inpatient hospital care spanning beyond 2 midnights from the point of admission due to high intensity of service, high risk for further deterioration and high frequency of surveillance  required.*  Author: Floydene Flock, MD 01/20/2023 3:33 PM  For on call review [email or web address].

## 2023-01-20 NOTE — Assessment & Plan Note (Signed)
CPAP.  

## 2023-01-20 NOTE — ED Notes (Signed)
Humidity applied to O2 supply and titrated to 4L. Will continue to monitor.

## 2023-01-20 NOTE — ED Notes (Signed)
Patient and family provided drinks per request.

## 2023-01-21 ENCOUNTER — Encounter: Payer: Self-pay | Admitting: Family Medicine

## 2023-01-21 ENCOUNTER — Other Ambulatory Visit: Payer: Self-pay

## 2023-01-21 DIAGNOSIS — G4733 Obstructive sleep apnea (adult) (pediatric): Secondary | ICD-10-CM

## 2023-01-21 DIAGNOSIS — J4551 Severe persistent asthma with (acute) exacerbation: Secondary | ICD-10-CM

## 2023-01-21 DIAGNOSIS — Z720 Tobacco use: Secondary | ICD-10-CM

## 2023-01-21 LAB — COMPREHENSIVE METABOLIC PANEL
ALT: 32 U/L (ref 0–44)
AST: 29 U/L (ref 15–41)
Albumin: 3.9 g/dL (ref 3.5–5.0)
Alkaline Phosphatase: 57 U/L (ref 38–126)
Anion gap: 12 (ref 5–15)
BUN: 16 mg/dL (ref 6–20)
CO2: 20 mmol/L — ABNORMAL LOW (ref 22–32)
Calcium: 9 mg/dL (ref 8.9–10.3)
Chloride: 103 mmol/L (ref 98–111)
Creatinine, Ser: 0.85 mg/dL (ref 0.61–1.24)
GFR, Estimated: >60 mL/min (ref >60)
Glucose, Bld: 160 mg/dL — ABNORMAL HIGH (ref 70–99)
Potassium: 4 mmol/L (ref 3.5–5.1)
Sodium: 135 mmol/L (ref 135–145)
Total Bilirubin: 0.5 mg/dL (ref 0.0–1.2)
Total Protein: 8 g/dL (ref 6.5–8.1)

## 2023-01-21 LAB — CBC
HCT: 37.8 % — ABNORMAL LOW (ref 39.0–52.0)
Hemoglobin: 12.2 g/dL — ABNORMAL LOW (ref 13.0–17.0)
MCH: 27.2 pg (ref 26.0–34.0)
MCHC: 32.3 g/dL (ref 30.0–36.0)
MCV: 84.2 fL (ref 80.0–100.0)
Platelets: 389 10*3/uL (ref 150–400)
RBC: 4.49 MIL/uL (ref 4.22–5.81)
RDW: 13.7 % (ref 11.5–15.5)
WBC: 10.9 10*3/uL — ABNORMAL HIGH (ref 4.0–10.5)
nRBC: 0 % (ref 0.0–0.2)

## 2023-01-21 MED ORDER — PREDNISONE 20 MG PO TABS
40.0000 mg | ORAL_TABLET | Freq: Every day | ORAL | 0 refills | Status: AC
  Filled 2023-01-21: qty 14, 7d supply, fill #0

## 2023-01-21 MED ORDER — ALUM & MAG HYDROXIDE-SIMETH 200-200-20 MG/5ML PO SUSP
30.0000 mL | Freq: Once | ORAL | Status: AC
  Administered 2023-01-21: 30 mL via ORAL
  Filled 2023-01-21: qty 30

## 2023-01-21 MED ORDER — IPRATROPIUM-ALBUTEROL 0.5-2.5 (3) MG/3ML IN SOLN
3.0000 mL | RESPIRATORY_TRACT | 0 refills | Status: DC | PRN
  Filled 2023-01-21: qty 360, 20d supply, fill #0

## 2023-01-21 MED ORDER — AMLODIPINE BESYLATE 5 MG PO TABS
5.0000 mg | ORAL_TABLET | Freq: Every day | ORAL | 0 refills | Status: DC
  Filled 2023-01-21: qty 90, 90d supply, fill #0

## 2023-01-21 MED ORDER — FLUTICASONE-SALMETEROL 250-50 MCG/ACT IN AEPB
1.0000 | INHALATION_SPRAY | Freq: Two times a day (BID) | RESPIRATORY_TRACT | 0 refills | Status: DC
  Filled 2023-01-21: qty 60, 30d supply, fill #0

## 2023-01-21 MED ORDER — DIPHENHYDRAMINE HCL 25 MG PO CAPS
25.0000 mg | ORAL_CAPSULE | Freq: Once | ORAL | Status: AC
  Administered 2023-01-21: 25 mg via ORAL
  Filled 2023-01-21: qty 1

## 2023-01-21 MED ORDER — ALBUTEROL SULFATE HFA 108 (90 BASE) MCG/ACT IN AERS
2.0000 | INHALATION_SPRAY | RESPIRATORY_TRACT | 0 refills | Status: DC | PRN
  Filled 2023-01-21: qty 6.7, 17d supply, fill #0

## 2023-01-21 NOTE — Discharge Summary (Signed)
[facility] Physician Discharge Summary   Patient name: Justin Reid  Admit date:     01/20/2023  Discharge date: 01/21/2023  Attending Physician: Floydene Flock [[id number]]  Discharge Physician: Carollee Herter   PCP: Pcp, No  Admitted From: Home  Disposition:  Home  Recommendations for Outpatient Follow-up:  Follow up with PCP in 1-2 weeks  Home Health:No Equipment/Devices: Home nebulizer machine  Discharge Condition:Stable CODE STATUS:FULL Diet recommendation: Heart Healthy Fluid Restriction: None  Hospital Summary: HPI: Justin Reid is a 43 y.o. male with medical history significant of obesity, hypertension, asthma, OHS presenting with acute respiratory failure with hypoxia, asthma exacerbation.  Patient reports a sudden onset of acute shortness of breath wheezing since this morning.  No sick contacts.  No fevers or chills.  Baseline 1/4 pack/day smoker.Baseline hypertension not on medications.  No nausea or vomiting.  He denies any illicit drug use including marijuana.  Occasional alcohol use.  Mild constipation symptoms.  No orthopnea PND.  No lower extremity swelling. Presented to the ER afebrile, heart rate 100s, BP stable.  Initially on BiPAP, transition to 4 L nasal cannula to keep O2 sats at 100%.  White count 10.2, hemoglobin 13.2, platelets 422, VBG stable, COVID flu and RSV negative.  Creatinine 0.9.  BNP 29.  Chest x-ray within normal limits.  Significant Events: Admitted 01/20/2023 with acute asthma exacerbation and acute respiratory failure with hypoxia   Significant Labs: White count 10.2, hemoglobin 13.2, platelets 422, VBG stable, COVID flu and RSV negative. Creatinine 0.9. BNP 29   Significant Imaging Studies: Chest x-ray within normal limits.   Antibiotic Therapy: Anti-infectives (From admission, onward)    None       Procedures:   Consultants:    Hospital Course by Problem: * Acute severe exacerbation of severe persistent  asthma 01-21-2023 now on RA. No more wheezing. Will need new PCP for asthma management. Pt not using any controller meds at home. Only using albuterol inhaler each day. Will need home nebulizer machine. 7 days of prednisone. Prn albuterol inhaler. Will need to find cheapest inhaler corticosteroid.  HTN (hypertension) 01-21-2023 started norvasc. Will DC to home with 5 mg daily. Will need new PCP for HTN med titration.  Acute respiratory failure with hypoxia (HCC) 01-21-2023 was initially on bipap. Change to O2 via [place]. Now weaned to RA.  OSA (obstructive sleep apnea) CPAP  Obesity (BMI 30-39.9) BMI 36  Discussed diet and lifestyle modification    Tobacco abuse 01-21-2023 advised to quit smoking altogether. If he cannot, at least no smoking while asthma exacerbation on-going.     Discharge Diagnoses:  Principal Problem:   Acute severe exacerbation of severe persistent asthma Active Problems:   Acute respiratory failure with hypoxia (HCC)   HTN (hypertension)   Tobacco abuse   Obesity (BMI 30-39.9)   OSA (obstructive sleep apnea)   Discharge Instructions  Discharge Instructions     Call MD for:  difficulty breathing, headache or visual disturbances   Complete by: As directed    Call MD for:  extreme fatigue   Complete by: As directed    Call MD for:  hives   Complete by: As directed    Call MD for:  persistant dizziness or light-headedness   Complete by: As directed    Call MD for:  persistant nausea and vomiting   Complete by: As directed    Call MD for:  temperature >100.4   Complete by: As directed    Diet -  low sodium heart healthy   Complete by: As directed    Discharge instructions   Complete by: As directed    1. Stop smoking 2. New primary care provider appointment will be made for you for asthma and high blood pressure management.   Increase activity slowly   Complete by: As directed       Allergies as of 01/21/2023   No Known Allergies       Medication List     TAKE these medications    albuterol 108 (90 Base) MCG/ACT inhaler Commonly known as: VENTOLIN HFA Inhale 2 puffs into the lungs every 4 (four) hours as needed for wheezing or shortness of breath. What changed: how much to take   amLODipine 5 MG tablet Commonly known as: NORVASC Take 1 tablet (5 mg total) by mouth daily.   fluticasone-salmeterol 250-50 MCG/ACT Aepb Commonly known as: Wixela Inhub Inhale 1 puff into the lungs in the morning and at bedtime.   ipratropium-albuterol 0.5-2.5 (3) MG/3ML Soln Commonly known as: DUONEB Take 3 mLs by nebulization every 4 (four) hours as needed.   predniSONE 20 MG tablet Commonly known as: DELTASONE Take 2 tablets (40 mg total) by mouth daily with breakfast for 7 days. Start taking on: January 22, 2023               Durable Medical Equipment  (From admission, onward)           Start     Ordered   01/21/23 0806  For home use only DME Nebulizer machine  Once       Question Answer Comment  Patient needs a nebulizer to treat with the following condition Asthma   Length of Need Lifetime   Additional equipment included Administration kit   Additional equipment included Filter      01/21/23 0806            No Known Allergies  Discharge Exam: Vitals:   01/21/23 0500 01/21/23 0512  BP: 135/71 135/71  Pulse: 91 91  Resp: 17 17  Temp:  98 F (36.7 C)  SpO2: 95% 95%    Physical Exam Vitals and nursing note reviewed.  Constitutional:      General: He is not in acute distress.    Appearance: He is obese. He is not toxic-appearing or diaphoretic.  HENT:     Head: Normocephalic and atraumatic.     Nose: Nose normal.  Cardiovascular:     Rate and Rhythm: Regular rhythm. Tachycardia present.     Comments: HR about 100 bpm Pulmonary:     Effort: Pulmonary effort is normal. No respiratory distress.     Breath sounds: Normal breath sounds. No wheezing or rales.  Abdominal:     General:  Abdomen is protuberant. Bowel sounds are normal. There is no distension.     Palpations: Abdomen is soft.     Tenderness: There is no abdominal tenderness.  Musculoskeletal:     Right lower leg: No edema.     Left lower leg: No edema.  Skin:    General: Skin is warm and dry.     Capillary Refill: Capillary refill takes less than 2 seconds.  Neurological:     General: No focal deficit present.     Mental Status: He is alert and oriented to person, place, and time.     The results of significant diagnostics from this hospitalization (including imaging, microbiology, ancillary and laboratory) are listed below for reference.    Microbiology:  Recent Results (from the past 240 hours)  Resp panel by RT-PCR (RSV, Flu A&B, Covid) Anterior Nasal Swab     Status: None   Collection Time: 01/20/23 12:52 PM   Specimen: Anterior Nasal Swab  Result Value Ref Range Status   SARS Coronavirus 2 by RT PCR NEGATIVE NEGATIVE Final    Comment: (NOTE) SARS-CoV-2 target nucleic acids are NOT DETECTED.  The SARS-CoV-2 RNA is generally detectable in upper respiratory specimens during the acute phase of infection. The lowest concentration of SARS-CoV-2 viral copies this assay can detect is 138 copies/mL. A negative result does not preclude SARS-Cov-2 infection and should not be used as the sole basis for treatment or other patient management decisions. A negative result may occur with  improper specimen collection/handling, submission of specimen other than nasopharyngeal swab, presence of viral mutation(s) within the areas targeted by this assay, and inadequate number of viral copies(<138 copies/mL). A negative result must be combined with clinical observations, patient history, and epidemiological information. The expected result is Negative.  Fact Sheet for Patients:  BloggerCourse.com  Fact Sheet for Healthcare Providers:  SeriousBroker.it  This  test is no t yet approved or cleared by the [place] FDA and  has been authorized for detection and/or diagnosis of SARS-CoV-2 by FDA under an Emergency Use Authorization (EUA). This EUA will remain  in effect (meaning this test can be used) for the duration of the COVID-19 declaration under Section 564(b)(1) of the Act, 21 U.S.C.section 360bbb-3(b)(1), unless the authorization is terminated  or revoked sooner.       Influenza A by PCR NEGATIVE NEGATIVE Final   Influenza B by PCR NEGATIVE NEGATIVE Final    Comment: (NOTE) The Xpert Xpress SARS-CoV-2/FLU/RSV plus assay is intended as an aid in the diagnosis of influenza from Nasopharyngeal swab specimens and should not be used as a sole basis for treatment. Nasal washings and aspirates are unacceptable for Xpert Xpress SARS-CoV-2/FLU/RSV testing.  Fact Sheet for Patients: BloggerCourse.com  Fact Sheet for Healthcare Providers: SeriousBroker.it  This test is not yet approved or cleared by the [place] FDA and has been authorized for detection and/or diagnosis of SARS-CoV-2 by FDA under an Emergency Use Authorization (EUA). This EUA will remain in effect (meaning this test can be used) for the duration of the COVID-19 declaration under Section 564(b)(1) of the Act, 21 U.S.C. section 360bbb-3(b)(1), unless the authorization is terminated or revoked.     Resp Syncytial Virus by PCR NEGATIVE NEGATIVE Final    Comment: (NOTE) Fact Sheet for Patients: BloggerCourse.com  Fact Sheet for Healthcare Providers: SeriousBroker.it  This test is not yet approved or cleared by the [place] FDA and has been authorized for detection and/or diagnosis of SARS-CoV-2 by FDA under an Emergency Use Authorization (EUA). This EUA will remain in effect (meaning this test can be used) for the duration of the COVID-19 declaration under  Section 564(b)(1) of the Act, 21 U.S.C. section 360bbb-3(b)(1), unless the authorization is terminated or revoked.  Performed at [facility], [address] [place], [place] [address]      Labs: BNP (last 3 results) Recent Labs    01/20/23 1252  BNP 28.9   Basic Metabolic Panel: Recent Labs  Lab 01/20/23 1252 01/21/23 0257  NA 135 135  K 3.4* 4.0  CL 99 103  CO2 25 20*  GLUCOSE 138* 160*  BUN 15 16  CREATININE 0.87 0.85  CALCIUM 8.9 9.0   Liver Function Tests: Recent Labs  Lab 01/21/23  0257  AST 29  ALT 32  ALKPHOS 57  BILITOT 0.5  PROT 8.0  ALBUMIN 3.9   CBC: Recent Labs  Lab 01/20/23 1252 01/21/23 0257  WBC 10.2 10.9*  NEUTROABS 5.0  --   HGB 13.2 12.2*  HCT 40.8 37.8*  MCV 85.9 84.2  PLT 422* 389   BNP: Recent Labs  Lab 01/20/23 1252  BNP 28.9   Sepsis Labs Recent Labs  Lab 01/20/23 1252 01/21/23 0257  WBC 10.2 10.9*   Microbiology Recent Results (from the past 240 hours)  Resp panel by RT-PCR (RSV, Flu A&B, Covid) Anterior Nasal Swab     Status: None   Collection Time: 01/20/23 12:52 PM   Specimen: Anterior Nasal Swab  Result Value Ref Range Status   SARS Coronavirus 2 by RT PCR NEGATIVE NEGATIVE Final    Comment: (NOTE) SARS-CoV-2 target nucleic acids are NOT DETECTED.  The SARS-CoV-2 RNA is generally detectable in upper respiratory specimens during the acute phase of infection. The lowest concentration of SARS-CoV-2 viral copies this assay can detect is 138 copies/mL. A negative result does not preclude SARS-Cov-2 infection and should not be used as the sole basis for treatment or other patient management decisions. A negative result may occur with  improper specimen collection/handling, submission of specimen other than nasopharyngeal swab, presence of viral mutation(s) within the areas targeted by this assay, and inadequate number of viral copies(<138 copies/mL). A negative result must be combined with clinical  observations, patient history, and epidemiological information. The expected result is Negative.  Fact Sheet for Patients:  BloggerCourse.com  Fact Sheet for Healthcare Providers:  SeriousBroker.it  This test is no t yet approved or cleared by the [place] FDA and  has been authorized for detection and/or diagnosis of SARS-CoV-2 by FDA under an Emergency Use Authorization (EUA). This EUA will remain  in effect (meaning this test can be used) for the duration of the COVID-19 declaration under Section 564(b)(1) of the Act, 21 U.S.C.section 360bbb-3(b)(1), unless the authorization is terminated  or revoked sooner.       Influenza A by PCR NEGATIVE NEGATIVE Final   Influenza B by PCR NEGATIVE NEGATIVE Final    Comment: (NOTE) The Xpert Xpress SARS-CoV-2/FLU/RSV plus assay is intended as an aid in the diagnosis of influenza from Nasopharyngeal swab specimens and should not be used as a sole basis for treatment. Nasal washings and aspirates are unacceptable for Xpert Xpress SARS-CoV-2/FLU/RSV testing.  Fact Sheet for Patients: BloggerCourse.com  Fact Sheet for Healthcare Providers: SeriousBroker.it  This test is not yet approved or cleared by the [place] FDA and has been authorized for detection and/or diagnosis of SARS-CoV-2 by FDA under an Emergency Use Authorization (EUA). This EUA will remain in effect (meaning this test can be used) for the duration of the COVID-19 declaration under Section 564(b)(1) of the Act, 21 U.S.C. section 360bbb-3(b)(1), unless the authorization is terminated or revoked.     Resp Syncytial Virus by PCR NEGATIVE NEGATIVE Final    Comment: (NOTE) Fact Sheet for Patients: BloggerCourse.com  Fact Sheet for Healthcare Providers: SeriousBroker.it  This test is not yet approved or cleared by  the [place] FDA and has been authorized for detection and/or diagnosis of SARS-CoV-2 by FDA under an Emergency Use Authorization (EUA). This EUA will remain in effect (meaning this test can be used) for the duration of the COVID-19 declaration under Section 564(b)(1) of the Act, 21 U.S.C. section 360bbb-3(b)(1), unless the authorization is terminated or revoked.  Performed at [facility], [address] [place], [place] [address]     Procedures/Studies: DG Chest Portable 1 View Result Date: 01/20/2023 CLINICAL DATA:  Wheezing, respiratory distress. EXAM: PORTABLE CHEST 1 VIEW COMPARISON:  January 03, 2023. FINDINGS: The heart size and mediastinal contours are within normal limits. Both lungs are clear. The visualized skeletal structures are unremarkable. IMPRESSION: No active disease. Electronically Signed   By: Lupita Raider M.D.   On: 01/20/2023 14:05   CT Angio Chest PE W and/or Wo Contrast Result Date: 01/03/2023 CLINICAL DATA:  Pt comes via EMS from home with c/o sob and cp that started this morning. Pt does have hx of asthma. Pulmonary embolism (PE) suspected, high prob EXAM: CT ANGIOGRAPHY CHEST WITH CONTRAST TECHNIQUE: Multidetector CT imaging of the chest was performed using the standard protocol during bolus administration of intravenous contrast. Multiplanar CT image reconstructions and MIPs were obtained to evaluate the vascular anatomy. RADIATION DOSE REDUCTION: This exam was performed according to the departmental dose-optimization program which includes automated exposure control, adjustment of the mA and/or kV according to patient size and/or use of iterative reconstruction technique. CONTRAST:  OMNIPAQUE IOHEXOL 350 MG/ML SOLN COMPARISON:  Chest XR, earlier same day.  CT chest, 11/10/2020. FINDINGS: Suboptimal evaluation, secondary to poor contrast opacification such that subsegmental emboli could missed. Cardiovascular: Satisfactory opacification of the  pulmonary arteries to the segmental level. No segmental or larger pulmonary embolus. Normal heart size. No pericardial effusion. 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 Mediastinum/Nodes: No enlarged mediastinal, hilar, or axillary lymph nodes. Thyroid gland, trachea, and esophagus demonstrate no significant findings. Lungs/Pleura: Lungs are clear without focal consolidation, mass or suspicious pulmonary nodule. No pleural effusion or pneumothorax. Upper Abdomen: No acute abnormality. 2 cm LEFT superior renal pole renal cortical cyst. No dedicated follow-up is indicated Musculoskeletal: No acute chest wall abnormality. No acute or significant osseous findings. Review of the MIP images confirms the above findings. IMPRESSION: 1. No segmental or larger pulmonary embolus. 2. No acute vascular or nonvascular intrathoracic abnormality. Additional incidental, chronic and senescent findings as above. Electronically Signed   By: Roanna Banning M.D.   On: 01/03/2023 12:04   DG Chest 2 View Result Date: 01/03/2023 CLINICAL DATA:  Chest pain. EXAM: CHEST - 2 VIEW COMPARISON:  September 09, 2022. FINDINGS: The heart size and mediastinal contours are within normal limits. Both lungs are clear. The visualized skeletal structures are unremarkable. IMPRESSION: No active cardiopulmonary disease. Electronically Signed   By: Lupita Raider M.D.   On: 01/03/2023 10:02    Time coordinating discharge: 45 mins  SIGNED:  Carollee Herter, DO [facility] 01/21/23, 8:53 AM

## 2023-01-21 NOTE — Assessment & Plan Note (Signed)
01-21-2023 was initially on bipap. Change to O2 via [place]. Now weaned to RA.

## 2023-01-21 NOTE — Discharge Instructions (Signed)
Discharge instructions 1. Stop smoking 2. New primary care provider appointment will be made for you for asthma and high blood pressure management.

## 2023-01-21 NOTE — Subjective & Objective (Signed)
Pt seen and examined. Girlfriend at bedside. Feels much better. On RA. Wants to go home. Advised to stop smoking No recent triggers. Uses albuterol inhaler on a daily basis. Not getting any routine medical care. Needs new PCP.

## 2023-01-21 NOTE — Hospital Course (Signed)
HPI: Justin Reid is a 43 y.o. male with medical history significant of obesity, hypertension, asthma, OHS presenting with acute respiratory failure with hypoxia, asthma exacerbation.  Patient reports a sudden onset of acute shortness of breath wheezing since this morning.  No sick contacts.  No fevers or chills.  Baseline 1/4 pack/day smoker.Baseline hypertension not on medications.  No nausea or vomiting.  He denies any illicit drug use including marijuana.  Occasional alcohol use.  Mild constipation symptoms.  No orthopnea PND.  No lower extremity swelling. Presented to the ER afebrile, heart rate 100s, BP stable.  Initially on BiPAP, transition to 4 L nasal cannula to keep O2 sats at 100%.  White count 10.2, hemoglobin 13.2, platelets 422, VBG stable, COVID flu and RSV negative.  Creatinine 0.9.  BNP 29.  Chest x-ray within normal limits.  Significant Events: Admitted 01/20/2023 with acute asthma exacerbation and acute respiratory failure with hypoxia   Significant Labs: White count 10.2, hemoglobin 13.2, platelets 422, VBG stable, COVID flu and RSV negative. Creatinine 0.9. BNP 29   Significant Imaging Studies: Chest x-ray within normal limits.   Antibiotic Therapy: Anti-infectives (From admission, onward)    None       Procedures:   Consultants:

## 2023-01-21 NOTE — TOC Initial Note (Signed)
Transition of Care [facility]) - Initial/Assessment Note    Patient Details  Name: Justin Reid MRN: [id number] Date of Birth: [birth year]  [facility] CM/SW Contact:    Liliana Cline, LCSW Phone Number: 01/21/2023, 10:27 AM  Clinical Narrative:                 CSW spoke with patient at bedside in the ED. TOC received consult for home nebulizer. Patient is agreeable to one being ordered. Referral made to Jon with [employer] for bedside delivery and he is aware this patient is discharged. Patient is uninsured, resources provided for [facility] and [facility] [employer]. Patient states he will follow up with [facility] to schedule an appointment. Patient declines additional needs prior to discharge today.  Expected Discharge Plan: Home/Self Care Barriers to Discharge: Barriers Resolved   Patient Goals and CMS Choice   CMS Medicare.gov Compare Post Acute Care list provided to:: Patient Choice offered to / list presented to : Patient [employer] ownership interest in [facility].provided to:: Patient    Expected Discharge Plan and Services       Living arrangements for the past 2 months: Single Family Home Expected Discharge Date: 01/21/23               DME Arranged: Nebulizer machine DME Agency: [employer] Date DME Agency Contacted: 01/21/23   Representative spoke with at DME Agency: Cletis [place]            Prior Living Arrangements/Services Living arrangements for the past 2 months: Single Family Home   Patient language and need for interpreter reviewed:: Yes Do you feel safe going back to the place where you live?: Yes      Need for Family Participation in Patient Care: Yes (Comment) Care giver support system in place?: Yes (comment)   Criminal Activity/Legal Involvement Pertinent to Current Situation/Hospitalization: No - Comment as needed  Activities of Daily Living      Permission Sought/Granted Permission sought to share information with :  Facility Industrial/product designer granted to share information with : Yes, Verbal Permission Granted     Permission granted to share info w AGENCY: DME agencies        Emotional Assessment       Orientation: : Oriented to Self, Oriented to Place, Oriented to  Time, Oriented to Situation Alcohol / Substance Use: Not Applicable Psych Involvement: No (comment)  Admission diagnosis:  Acute respiratory failure with hypoxia (HCC) [J96.01] Patient Active Problem List   Diagnosis Date Noted   Acute respiratory failure with hypoxia (HCC) 01/20/2023   HTN (hypertension) 01/20/2023   Tobacco abuse 12/28/2021   Obesity (BMI 30-39.9) 12/28/2021   OSA (obstructive sleep apnea) 12/28/2021   At risk for sleep apnea 11/10/2020   Obesity hypoventilation syndrome (HCC) 11/10/2020   Truncal obesity 11/10/2020   Asthma 07/07/2016   Acute severe exacerbation of severe persistent asthma 02/20/2016   PCP:  Pcp, No Pharmacy:   [facility] [address]), [place] - [address]) [place] [address] Phone: [phone] Fax: [phone]  [id number] [place], [place] - [address] [place] [address] Phone: [phone] Fax: [phone]  [facility] [address], [place] - [address] [place] [address] Phone: [phone] Fax: [phone]  [facility] [address] [place] [address] Phone: [phone] Fax: [phone]  Social Drivers of Health (SDOH) Social History: SDOH Screenings   Tobacco Use: High Risk (01/03/2023)   SDOH Interventions:     Readmission Risk Interventions     No data to display

## 2023-01-21 NOTE — Progress Notes (Signed)
PROGRESS NOTE    Justin Reid  UVO:[id number] DOB: 1980/05/30 DOA: 01/20/2023 PCP: Pcp, No  Subjective: Pt seen and examined. Girlfriend at bedside. Feels much better. On RA. Wants to go home. Advised to stop smoking No recent triggers. Uses albuterol inhaler on a daily basis. Not getting any routine medical care. Needs new PCP.   Hospital Course: HPI: Justin Reid is a 43 y.o. male with medical history significant of obesity, hypertension, asthma, OHS presenting with acute respiratory failure with hypoxia, asthma exacerbation.  Patient reports a sudden onset of acute shortness of breath wheezing since this morning.  No sick contacts.  No fevers or chills.  Baseline 1/4 pack/day smoker.Baseline hypertension not on medications.  No nausea or vomiting.  He denies any illicit drug use including marijuana.  Occasional alcohol use.  Mild constipation symptoms.  No orthopnea PND.  No lower extremity swelling. Presented to the ER afebrile, heart rate 100s, BP stable.  Initially on BiPAP, transition to 4 L nasal cannula to keep O2 sats at 100%.  White count 10.2, hemoglobin 13.2, platelets 422, VBG stable, COVID flu and RSV negative.  Creatinine 0.9.  BNP 29.  Chest x-ray within normal limits.  Significant Events: Admitted 01/20/2023 with acute asthma exacerbation and acute respiratory failure with hypoxia   Significant Labs: White count 10.2, hemoglobin 13.2, platelets 422, VBG stable, COVID flu and RSV negative. Creatinine 0.9. BNP 29   Significant Imaging Studies: Chest x-ray within normal limits.   Antibiotic Therapy: Anti-infectives (From admission, onward)    None       Procedures:   Consultants:     Assessment and Plan: * Acute severe exacerbation of severe persistent asthma 01-21-2023 now on RA. No more wheezing. Will need new PCP for asthma management. Pt not using any controller meds at home. Only using albuterol inhaler each day. Will need home nebulizer  machine. 7 days of prednisone. Prn albuterol inhaler. Will need to find cheapest inhaler corticosteroid.  HTN (hypertension) 01-21-2023 started norvasc. Will DC to home with 5 mg daily. Will need new PCP for HTN med titration.  Acute respiratory failure with hypoxia (HCC) 01-21-2023 was initially on bipap. Change to O2 via [place]. Now weaned to RA.  OSA (obstructive sleep apnea) CPAP  Obesity (BMI 30-39.9) BMI 36  Discussed diet and lifestyle modification    Tobacco abuse 01-21-2023 advised to quit smoking altogether. If he cannot, at least no smoking while asthma exacerbation on-going.    DVT prophylaxis:   Lovenox   Code Status: Full Code Family Communication: discussed with pt and girlfriend at bedside Disposition Plan: return home Reason for continuing need for hospitalization: stable for DC.  Objective: Vitals:   01/20/23 2300 01/21/23 0210 01/21/23 0500 01/21/23 0512  BP: 133/84 (!) 145/95 135/71 135/71  Pulse: (!) 103 93 91 91  Resp: (!) 22 17 17 17   Temp:  97.8 F (36.6 C)  98 F (36.7 C)  TempSrc:  Oral  Oral  SpO2: 98% 96% 95% 95%  Weight:      Height:       No intake or output data in the 24 hours ending 01/21/23 0839 Filed Weights   01/20/23 1257  Weight: 113.4 kg    Examination:  Physical Exam Vitals and nursing note reviewed.  Constitutional:      General: He is not in acute distress.    Appearance: He is obese. He is not toxic-appearing or diaphoretic.  HENT:     Head: Normocephalic and atraumatic.  Nose: Nose normal.  Cardiovascular:     Rate and Rhythm: Regular rhythm. Tachycardia present.     Comments: HR about 100 bpm Pulmonary:     Effort: Pulmonary effort is normal. No respiratory distress.     Breath sounds: Normal breath sounds. No wheezing or rales.  Abdominal:     General: Abdomen is protuberant. Bowel sounds are normal. There is no distension.     Palpations: Abdomen is soft.     Tenderness: There is no abdominal tenderness.   Musculoskeletal:     Right lower leg: No edema.     Left lower leg: No edema.  Skin:    General: Skin is warm and dry.     Capillary Refill: Capillary refill takes less than 2 seconds.  Neurological:     General: No focal deficit present.     Mental Status: He is alert and oriented to person, place, and time.     Data Reviewed: I have personally reviewed following labs and imaging studies  CBC: Recent Labs  Lab 01/20/23 1252 01/21/23 0257  WBC 10.2 10.9*  NEUTROABS 5.0  --   HGB 13.2 12.2*  HCT 40.8 37.8*  MCV 85.9 84.2  PLT 422* 389   Basic Metabolic Panel: Recent Labs  Lab 01/20/23 1252 01/21/23 0257  NA 135 135  K 3.4* 4.0  CL 99 103  CO2 25 20*  GLUCOSE 138* 160*  BUN 15 16  CREATININE 0.87 0.85  CALCIUM 8.9 9.0   GFR: Estimated Creatinine Clearance: 144.3 mL/min (by C-G formula based on SCr of 0.85 mg/dL). Liver Function Tests: Recent Labs  Lab 01/21/23 0257  AST 29  ALT 32  ALKPHOS 57  BILITOT 0.5  PROT 8.0  ALBUMIN 3.9   BNP (last 3 results) Recent Labs    01/20/23 1252  BNP 28.9    Recent Results (from the past 240 hours)  Resp panel by RT-PCR (RSV, Flu A&B, Covid) Anterior Nasal Swab     Status: None   Collection Time: 01/20/23 12:52 PM   Specimen: Anterior Nasal Swab  Result Value Ref Range Status   SARS Coronavirus 2 by RT PCR NEGATIVE NEGATIVE Final    Comment: (NOTE) SARS-CoV-2 target nucleic acids are NOT DETECTED.  The SARS-CoV-2 RNA is generally detectable in upper respiratory specimens during the acute phase of infection. The lowest concentration of SARS-CoV-2 viral copies this assay can detect is 138 copies/mL. A negative result does not preclude SARS-Cov-2 infection and should not be used as the sole basis for treatment or other patient management decisions. A negative result may occur with  improper specimen collection/handling, submission of specimen other than nasopharyngeal swab, presence of viral mutation(s) within  the areas targeted by this assay, and inadequate number of viral copies(<138 copies/mL). A negative result must be combined with clinical observations, patient history, and epidemiological information. The expected result is Negative.  Fact Sheet for Patients:  BloggerCourse.com  Fact Sheet for Healthcare Providers:  SeriousBroker.it  This test is no t yet approved or cleared by the [place] FDA and  has been authorized for detection and/or diagnosis of SARS-CoV-2 by FDA under an Emergency Use Authorization (EUA). This EUA will remain  in effect (meaning this test can be used) for the duration of the COVID-19 declaration under Section 564(b)(1) of the Act, 21 U.S.C.section 360bbb-3(b)(1), unless the authorization is terminated  or revoked sooner.       Influenza A by PCR NEGATIVE NEGATIVE Final   Influenza B by  PCR NEGATIVE NEGATIVE Final    Comment: (NOTE) The Xpert Xpress SARS-CoV-2/FLU/RSV plus assay is intended as an aid in the diagnosis of influenza from Nasopharyngeal swab specimens and should not be used as a sole basis for treatment. Nasal washings and aspirates are unacceptable for Xpert Xpress SARS-CoV-2/FLU/RSV testing.  Fact Sheet for Patients: BloggerCourse.com  Fact Sheet for Healthcare Providers: SeriousBroker.it  This test is not yet approved or cleared by the [place] FDA and has been authorized for detection and/or diagnosis of SARS-CoV-2 by FDA under an Emergency Use Authorization (EUA). This EUA will remain in effect (meaning this test can be used) for the duration of the COVID-19 declaration under Section 564(b)(1) of the Act, 21 U.S.C. section 360bbb-3(b)(1), unless the authorization is terminated or revoked.     Resp Syncytial Virus by PCR NEGATIVE NEGATIVE Final    Comment: (NOTE) Fact Sheet for  Patients: BloggerCourse.com  Fact Sheet for Healthcare Providers: SeriousBroker.it  This test is not yet approved or cleared by the [place] FDA and has been authorized for detection and/or diagnosis of SARS-CoV-2 by FDA under an Emergency Use Authorization (EUA). This EUA will remain in effect (meaning this test can be used) for the duration of the COVID-19 declaration under Section 564(b)(1) of the Act, 21 U.S.C. section 360bbb-3(b)(1), unless the authorization is terminated or revoked.  Performed at [facility], [address], [place], [place] [address]      Radiology Studies: DG Chest Portable 1 View Result Date: 01/20/2023 CLINICAL DATA:  Wheezing, respiratory distress. EXAM: PORTABLE CHEST 1 VIEW COMPARISON:  January 03, 2023. FINDINGS: The heart size and mediastinal contours are within normal limits. Both lungs are clear. The visualized skeletal structures are unremarkable. IMPRESSION: No active disease. Electronically Signed   By: Lupita Raider M.D.   On: 01/20/2023 14:05    Scheduled Meds:  amLODipine  2.5 mg Oral Daily   enoxaparin (LOVENOX) injection  0.5 mg/kg Subcutaneous Q24H   ipratropium-albuterol  3 mL Nebulization Q6H   nicotine  7 mg Transdermal Daily   [START ON 01/22/2023] predniSONE  40 mg Oral Q breakfast   Continuous Infusions:   LOS: 1 day   Time spent: 40 minutes  Carollee Herter, DO  [facility]  01/21/2023, 8:39 AM

## 2023-01-21 NOTE — ED Notes (Signed)
Awaiting TOC for at home neb machine

## 2023-02-03 ENCOUNTER — Emergency Department
Admission: EM | Admit: 2023-02-03 | Discharge: 2023-02-03 | Disposition: A | Discharge status: Home / Self Care | Payer: Self-pay | Attending: Emergency Medicine | Admitting: Emergency Medicine

## 2023-02-03 ENCOUNTER — Other Ambulatory Visit: Payer: Self-pay

## 2023-02-03 ENCOUNTER — Emergency Department: Payer: Self-pay

## 2023-02-03 DIAGNOSIS — R112 Nausea with vomiting, unspecified: Secondary | ICD-10-CM | POA: Diagnosis not present

## 2023-02-03 DIAGNOSIS — J45909 Unspecified asthma, uncomplicated: Secondary | ICD-10-CM | POA: Insufficient documentation

## 2023-02-03 DIAGNOSIS — E860 Dehydration: Secondary | ICD-10-CM | POA: Insufficient documentation

## 2023-02-03 DIAGNOSIS — R197 Diarrhea, unspecified: Secondary | ICD-10-CM | POA: Insufficient documentation

## 2023-02-03 DIAGNOSIS — I10 Essential (primary) hypertension: Secondary | ICD-10-CM | POA: Insufficient documentation

## 2023-02-03 DIAGNOSIS — Z87891 Personal history of nicotine dependence: Secondary | ICD-10-CM | POA: Insufficient documentation

## 2023-02-03 DIAGNOSIS — Z1152 Encounter for screening for COVID-19: Secondary | ICD-10-CM | POA: Insufficient documentation

## 2023-02-03 DIAGNOSIS — R002 Palpitations: Secondary | ICD-10-CM | POA: Insufficient documentation

## 2023-02-03 LAB — TROPONIN I (HIGH SENSITIVITY): Troponin I (High Sensitivity): 7 ng/L (ref <18)

## 2023-02-03 LAB — CBC
HCT: 40.6 % (ref 39.0–52.0)
Hemoglobin: 13.3 g/dL (ref 13.0–17.0)
MCH: 27.4 pg (ref 26.0–34.0)
MCHC: 32.8 g/dL (ref 30.0–36.0)
MCV: 83.5 fL (ref 80.0–100.0)
Platelets: 366 10*3/uL (ref 150–400)
RBC: 4.86 MIL/uL (ref 4.22–5.81)
RDW: 13.3 % (ref 11.5–15.5)
WBC: 6.8 10*3/uL (ref 4.0–10.5)
nRBC: 0 % (ref 0.0–0.2)

## 2023-02-03 LAB — RESP PANEL BY RT-PCR (RSV, FLU A&B, COVID)  RVPGX2
Influenza A by PCR: NEGATIVE
Influenza B by PCR: NEGATIVE
Resp Syncytial Virus by PCR: NEGATIVE
SARS Coronavirus 2 by RT PCR: NEGATIVE

## 2023-02-03 LAB — BASIC METABOLIC PANEL
Anion gap: 14 (ref 5–15)
BUN: 17 mg/dL (ref 6–20)
CO2: 22 mmol/L (ref 22–32)
Calcium: 8.7 mg/dL — ABNORMAL LOW (ref 8.9–10.3)
Chloride: 95 mmol/L — ABNORMAL LOW (ref 98–111)
Creatinine, Ser: 0.95 mg/dL (ref 0.61–1.24)
GFR, Estimated: >60 mL/min (ref >60)
Glucose, Bld: 99 mg/dL (ref 70–99)
Potassium: 3.2 mmol/L — ABNORMAL LOW (ref 3.5–5.1)
Sodium: 131 mmol/L — ABNORMAL LOW (ref 135–145)

## 2023-02-03 MED ORDER — ONDANSETRON 8 MG PO TBDP
8.0000 mg | ORAL_TABLET | Freq: Once | ORAL | Status: AC
  Administered 2023-02-03: 8 mg via ORAL
  Filled 2023-02-03: qty 1

## 2023-02-03 MED ORDER — ONDANSETRON 8 MG PO TBDP: 8.0000 mg | ORAL_TABLET | Freq: Three times a day (TID) | ORAL | 0 refills | Status: DC | PRN

## 2023-02-03 NOTE — ED Provider Notes (Signed)
[facility] Provider Note   Event Date/Time   First MD Initiated Contact with Patient 02/03/23 [phone]     (approximate) History  Palpitations  HPI Justin Reid is a 43 y.o. male with a past medical history of hypertension, asthma, and tobacco abuse who presents complaining of palpitations after day of nausea/vomiting/diarrhea.  Patient states there is sick contact at home with similar symptoms.  Patient denies any previous abdominal surgeries. ROS: Patient currently denies any vision changes, tinnitus, difficulty speaking, facial droop, sore throat, chest pain, shortness of breath, abdominal pain, dysuria, or weakness/numbness/paresthesias in any extremity   Physical Exam  Triage Vital Signs: ED Triage Vitals  Encounter Vitals Group     BP 02/03/23 0547 (!) 141/94     Systolic BP Percentile --      Diastolic BP Percentile --      Pulse Rate 02/03/23 0547 (!) 105     Resp 02/03/23 0547 19     Temp 02/03/23 0547 99.4 F (37.4 C)     Temp Source 02/03/23 0547 Oral     SpO2 02/03/23 0547 93 %     Weight 02/03/23 0545 300 lb (136.1 kg)     Height 02/03/23 0545 5\' 9"  (1.753 m)     Head Circumference --      Peak Flow --      Pain Score 02/03/23 0545 0     Pain Loc --      Pain Education --      Exclude from Growth Chart --    Most recent vital signs: Vitals:   02/03/23 0547  BP: (!) 141/94  Pulse: (!) 105  Resp: 19  Temp: 99.4 F (37.4 C)  SpO2: 93%   General: Awake, oriented x4. CV:  Good peripheral perfusion.  Resp:  Normal effort.  Abd:  No distention.  Other:  Middle-aged obese African-American male resting comfortably in no acute distress ED Results / Procedures / Treatments  Labs (all labs ordered are listed, but only abnormal results are displayed) Labs Reviewed  BASIC METABOLIC PANEL - Abnormal; Notable for the following components:      Result Value   Sodium 131 (*)    Potassium 3.2 (*)    Chloride 95 (*)    Calcium 8.7 (*)     All other components within normal limits  RESP PANEL BY RT-PCR (RSV, FLU A&B, COVID)  [id number]  CBC  TROPONIN I (HIGH SENSITIVITY)   EKG ED ECG REPORT I, Merwyn Katos, the attending physician, personally viewed and interpreted this ECG. Date: 02/03/2023 EKG Time: 0548 Rate: 105 Rhythm: Tachycardic sinus rhythm QRS Axis: normal Intervals: normal ST/T Wave abnormalities: normal Narrative Interpretation: Tachycardic sinus rhythm.  No evidence of acute ischemia RADIOLOGY ED MD interpretation: 2 view chest x-ray interpreted by me shows no evidence of acute abnormalities including no pneumonia, pneumothorax, or widened mediastinum -Agree with radiology assessment Official radiology report(s): DG Chest 2 View Result Date: 02/03/2023 CLINICAL DATA:  43 year old male with history of chest pain. EXAM: CHEST - 2 VIEW COMPARISON:  Chest x-ray 01/20/2023. FINDINGS: Lung volumes are normal. No consolidative airspace disease. No pleural effusions. No pneumothorax. No pulmonary nodule or mass noted. Pulmonary vasculature and the cardiomediastinal silhouette are within normal limits. IMPRESSION: No radiographic evidence of acute cardiopulmonary disease. Electronically Signed   By: Trudie Reed M.D.   On: 02/03/2023 06:29   PROCEDURES: Critical Care performed: No Procedures MEDICATIONS ORDERED IN ED: Medications  ondansetron (ZOFRAN-ODT) disintegrating tablet  8 mg (8 mg Oral Given 02/03/23 0750)   IMPRESSION / MDM / ASSESSMENT AND PLAN / ED COURSE  I reviewed the triage vital signs and the nursing notes.                             The patient is on the cardiac monitor to evaluate for evidence of arrhythmia and/or significant heart rate changes. Patient's presentation is most consistent with acute presentation with potential threat to life or bodily function. 43 year old male presents with palpitations. EKG: No STEMI and no evidence of Brugada's sign, delta wave, epsilon wave, significantly  prolonged QTc, or malignant arrhythmia. Based on H&P and testing, this patient appears to be low risk for emergent causes of palpitations such as, but not limited to, a malignant cardiac arrhythmia, ACS, pulmonary embolism, thyrotoxicosis, PNA, PTX. Patient likely dehydrated nausea/vomiting/diarrhea for the last 24 hours.  Patient was given Zofran and is p.o. tolerant at this time. The patient has been given strict return precautions and understands the need for further outpatient testing and treatment.   FINAL CLINICAL IMPRESSION(S) / ED DIAGNOSES   Final diagnoses:  Nausea, vomiting, and diarrhea  Palpitations  Dehydration   Rx / DC Orders   ED Discharge Orders          Ordered    ondansetron (ZOFRAN-ODT) 8 MG disintegrating tablet  Every 8 hours PRN        02/03/23 0839           Note:  This document was prepared using Dragon voice recognition software and may include unintentional dictation errors.   Merwyn Katos, MD 02/03/23 [phone]

## 2023-02-03 NOTE — ED Notes (Signed)
Lab called to see if they have COVID/RSV/FLU swab, lab states they do, order now in place, lab will run specimen.

## 2023-02-03 NOTE — ED Notes (Signed)
D/C, new meds, and reason to return discussed with pt, pt verbalized understanding. NAD noted. Pt ambulatory on D/C.

## 2023-02-03 NOTE — ED Triage Notes (Signed)
Pt reports palpitations for the past hour, pt denies pain. Pt denies known cardiac hx. Pt denies cough or congestion. Denies sob

## 2023-02-03 NOTE — ED Notes (Signed)
Pt states N/V/D that started yesterday.  Pt denies fevers. Pt states able to drink and keeps fluids down. NAD noted. Pt denies cardiac HX.

## 2023-02-21 ENCOUNTER — Emergency Department: Payer: Self-pay | Attending: Student in an Organized Health Care Education/Training Program

## 2023-02-21 ENCOUNTER — Emergency Department: Payer: Self-pay

## 2023-02-21 ENCOUNTER — Inpatient Hospital Stay
Admission: EM | Admit: 2023-02-21 | Discharge: 2023-02-26 | DRG: 202 | Disposition: A | Discharge status: Home / Self Care | Payer: Self-pay | Attending: Osteopathic Medicine | Admitting: Osteopathic Medicine

## 2023-02-21 ENCOUNTER — Other Ambulatory Visit: Payer: Self-pay

## 2023-02-21 DIAGNOSIS — M25562 Pain in left knee: Secondary | ICD-10-CM | POA: Diagnosis not present

## 2023-02-21 DIAGNOSIS — Z9189 Other specified personal risk factors, not elsewhere classified: Secondary | ICD-10-CM

## 2023-02-21 DIAGNOSIS — R0602 Shortness of breath: Principal | ICD-10-CM

## 2023-02-21 DIAGNOSIS — M25561 Pain in right knee: Secondary | ICD-10-CM

## 2023-02-21 DIAGNOSIS — N281 Cyst of kidney, acquired: Secondary | ICD-10-CM | POA: Diagnosis not present

## 2023-02-21 DIAGNOSIS — J45901 Unspecified asthma with (acute) exacerbation: Secondary | ICD-10-CM | POA: Diagnosis not present

## 2023-02-21 DIAGNOSIS — J9601 Acute respiratory failure with hypoxia: Secondary | ICD-10-CM | POA: Diagnosis present

## 2023-02-21 DIAGNOSIS — J45909 Unspecified asthma, uncomplicated: Secondary | ICD-10-CM | POA: Diagnosis present

## 2023-02-21 DIAGNOSIS — Z6841 Body Mass Index (BMI) 40.0 and over, adult: Secondary | ICD-10-CM

## 2023-02-21 DIAGNOSIS — F17200 Nicotine dependence, unspecified, uncomplicated: Secondary | ICD-10-CM | POA: Diagnosis present

## 2023-02-21 DIAGNOSIS — F17210 Nicotine dependence, cigarettes, uncomplicated: Secondary | ICD-10-CM | POA: Diagnosis present

## 2023-02-21 DIAGNOSIS — Z79899 Other long term (current) drug therapy: Secondary | ICD-10-CM

## 2023-02-21 DIAGNOSIS — Z1152 Encounter for screening for COVID-19: Secondary | ICD-10-CM

## 2023-02-21 DIAGNOSIS — I10 Essential (primary) hypertension: Secondary | ICD-10-CM | POA: Diagnosis present

## 2023-02-21 DIAGNOSIS — E662 Morbid (severe) obesity with alveolar hypoventilation: Secondary | ICD-10-CM | POA: Diagnosis present

## 2023-02-21 DIAGNOSIS — G4733 Obstructive sleep apnea (adult) (pediatric): Secondary | ICD-10-CM | POA: Diagnosis present

## 2023-02-21 DIAGNOSIS — J4541 Moderate persistent asthma with (acute) exacerbation: Secondary | ICD-10-CM

## 2023-02-21 DIAGNOSIS — E66813 Obesity, class 3: Secondary | ICD-10-CM | POA: Insufficient documentation

## 2023-02-21 DIAGNOSIS — Z8249 Family history of ischemic heart disease and other diseases of the circulatory system: Secondary | ICD-10-CM

## 2023-02-21 DIAGNOSIS — Z720 Tobacco use: Secondary | ICD-10-CM | POA: Diagnosis present

## 2023-02-21 DIAGNOSIS — J441 Chronic obstructive pulmonary disease with (acute) exacerbation: Secondary | ICD-10-CM | POA: Diagnosis present

## 2023-02-21 DIAGNOSIS — Z7951 Long term (current) use of inhaled steroids: Secondary | ICD-10-CM

## 2023-02-21 LAB — BASIC METABOLIC PANEL
Anion gap: 9 (ref 5–15)
BUN: 12 mg/dL (ref 6–20)
CO2: 24 mmol/L (ref 22–32)
Calcium: 8.6 mg/dL — ABNORMAL LOW (ref 8.9–10.3)
Chloride: 103 mmol/L (ref 98–111)
Creatinine, Ser: 1.01 mg/dL (ref 0.61–1.24)
GFR, Estimated: >60 mL/min (ref >60)
Glucose, Bld: 102 mg/dL — ABNORMAL HIGH (ref 70–99)
Potassium: 3.5 mmol/L (ref 3.5–5.1)
Sodium: 136 mmol/L (ref 135–145)

## 2023-02-21 LAB — CBC
HCT: 38.4 % — ABNORMAL LOW (ref 39.0–52.0)
Hemoglobin: 12.2 g/dL — ABNORMAL LOW (ref 13.0–17.0)
MCH: 27.7 pg (ref 26.0–34.0)
MCHC: 31.8 g/dL (ref 30.0–36.0)
MCV: 87.1 fL (ref 80.0–100.0)
Platelets: 433 10*3/uL — ABNORMAL HIGH (ref 150–400)
RBC: 4.41 MIL/uL (ref 4.22–5.81)
RDW: 13.7 % (ref 11.5–15.5)
WBC: 7.3 10*3/uL (ref 4.0–10.5)
nRBC: 0 % (ref 0.0–0.2)

## 2023-02-21 LAB — RESP PANEL BY RT-PCR (RSV, FLU A&B, COVID)  RVPGX2
Influenza A by PCR: NEGATIVE
Influenza B by PCR: NEGATIVE
Resp Syncytial Virus by PCR: NEGATIVE
SARS Coronavirus 2 by RT PCR: NEGATIVE

## 2023-02-21 LAB — BRAIN NATRIURETIC PEPTIDE: B Natriuretic Peptide: 19.6 pg/mL (ref 0.0–100.0)

## 2023-02-21 LAB — TROPONIN I (HIGH SENSITIVITY)
Troponin I (High Sensitivity): 8 ng/L (ref <18)
Troponin I (High Sensitivity): 6 ng/L (ref <18)

## 2023-02-21 MED ORDER — IPRATROPIUM-ALBUTEROL 0.5-2.5 (3) MG/3ML IN SOLN
3.0000 mL | Freq: Four times a day (QID) | RESPIRATORY_TRACT | Status: DC
Start: 2023-02-21 — End: 2023-02-22
  Administered 2023-02-21 – 2023-02-22 (×3): 3 mL via RESPIRATORY_TRACT
  Filled 2023-02-21 (×3): qty 3

## 2023-02-21 MED ORDER — MOMETASONE FURO-FORMOTEROL FUM 200-5 MCG/ACT IN AERO
2.0000 | INHALATION_SPRAY | Freq: Two times a day (BID) | RESPIRATORY_TRACT | Status: DC
  Administered 2023-02-21 – 2023-02-24 (×6): 2 via RESPIRATORY_TRACT
  Filled 2023-02-21 (×2): qty 8.8

## 2023-02-21 MED ORDER — ONDANSETRON HCL 4 MG/2ML IJ SOLN: 4.0000 mg | Freq: Four times a day (QID) | INTRAMUSCULAR | Status: DC | PRN

## 2023-02-21 MED ORDER — LORATADINE 10 MG PO TABS
10.0000 mg | ORAL_TABLET | Freq: Every day | ORAL | Status: DC
  Administered 2023-02-21 – 2023-02-26 (×6): 10 mg via ORAL
  Filled 2023-02-21 (×6): qty 1

## 2023-02-21 MED ORDER — ACETAMINOPHEN 500 MG PO TABS
1000.0000 mg | ORAL_TABLET | Freq: Once | ORAL | Status: AC
  Administered 2023-02-21: 1000 mg via ORAL
  Filled 2023-02-21: qty 2

## 2023-02-21 MED ORDER — ALBUTEROL SULFATE (2.5 MG/3ML) 0.083% IN NEBU
10.0000 mg/h | INHALATION_SOLUTION | Freq: Once | RESPIRATORY_TRACT | Status: AC
  Administered 2023-02-21: 10 mg/h via RESPIRATORY_TRACT
  Filled 2023-02-21: qty 3
  Filled 2023-02-21: qty 12

## 2023-02-21 MED ORDER — HYDRALAZINE HCL 20 MG/ML IJ SOLN: 5.0000 mg | Freq: Four times a day (QID) | INTRAMUSCULAR | Status: DC | PRN

## 2023-02-21 MED ORDER — METHYLPREDNISOLONE SODIUM SUCC 125 MG IJ SOLR
125.0000 mg | Freq: Once | INTRAMUSCULAR | Status: AC
  Administered 2023-02-21: 125 mg via INTRAVENOUS
  Filled 2023-02-21: qty 2

## 2023-02-21 MED ORDER — IPRATROPIUM-ALBUTEROL 0.5-2.5 (3) MG/3ML IN SOLN
9.0000 mL | Freq: Once | RESPIRATORY_TRACT | Status: AC
  Administered 2023-02-21: 9 mL via RESPIRATORY_TRACT
  Filled 2023-02-21: qty 9

## 2023-02-21 MED ORDER — METHYLPREDNISOLONE SODIUM SUCC 40 MG IJ SOLR
40.0000 mg | Freq: Every day | INTRAMUSCULAR | Status: AC
  Administered 2023-02-22: 40 mg via INTRAVENOUS
  Filled 2023-02-21: qty 1

## 2023-02-21 MED ORDER — ENOXAPARIN SODIUM 60 MG/0.6ML IJ SOSY
60.0000 mg | PREFILLED_SYRINGE | INTRAMUSCULAR | Status: DC
  Administered 2023-02-21 – 2023-02-25 (×5): 60 mg via SUBCUTANEOUS
  Filled 2023-02-21 (×5): qty 0.6

## 2023-02-21 MED ORDER — ALUM & MAG HYDROXIDE-SIMETH 200-200-20 MG/5ML PO SUSP
15.0000 mL | Freq: Four times a day (QID) | ORAL | Status: DC | PRN
  Administered 2023-02-21: 15 mL via ORAL
  Filled 2023-02-21: qty 30

## 2023-02-21 MED ORDER — LORATADINE 10 MG PO TABS: 10.0000 mg | ORAL_TABLET | Freq: Every day | ORAL | Status: DC

## 2023-02-21 MED ORDER — AMLODIPINE BESYLATE 5 MG PO TABS
5.0000 mg | ORAL_TABLET | Freq: Every day | ORAL | Status: DC
  Administered 2023-02-21 – 2023-02-26 (×6): 5 mg via ORAL
  Filled 2023-02-21 (×6): qty 1

## 2023-02-21 MED ORDER — ONDANSETRON HCL 4 MG PO TABS
4.0000 mg | ORAL_TABLET | Freq: Four times a day (QID) | ORAL | Status: DC | PRN
  Administered 2023-02-22: 4 mg via ORAL
  Filled 2023-02-21: qty 1

## 2023-02-21 MED ORDER — SENNOSIDES-DOCUSATE SODIUM 8.6-50 MG PO TABS: 1.0000 | ORAL_TABLET | Freq: Every evening | ORAL | Status: DC | PRN

## 2023-02-21 MED ORDER — ACETAMINOPHEN 500 MG PO TABS
1000.0000 mg | ORAL_TABLET | Freq: Once | ORAL | Status: AC
  Administered 2023-02-22: 1000 mg via ORAL
  Filled 2023-02-21: qty 2

## 2023-02-21 MED ORDER — NICOTINE POLACRILEX 2 MG MT GUM: 2.0000 mg | CHEWING_GUM | OROMUCOSAL | Status: DC | PRN

## 2023-02-21 MED ORDER — MAGNESIUM SULFATE 2 GM/50ML IV SOLN
2.0000 g | Freq: Once | INTRAVENOUS | Status: AC
  Administered 2023-02-21: 2 g via INTRAVENOUS
  Filled 2023-02-21: qty 50

## 2023-02-21 MED ORDER — IOHEXOL 350 MG/ML SOLN
75.0000 mL | Freq: Once | INTRAVENOUS | Status: AC | PRN
  Administered 2023-02-21: 75 mL via INTRAVENOUS

## 2023-02-21 MED ORDER — ACETAMINOPHEN 325 MG PO TABS
650.0000 mg | ORAL_TABLET | Freq: Four times a day (QID) | ORAL | Status: DC | PRN
  Administered 2023-02-25 (×2): 650 mg via ORAL
  Filled 2023-02-21 (×2): qty 2

## 2023-02-21 MED ORDER — NICOTINE 14 MG/24HR TD PT24: 14.0000 mg | MEDICATED_PATCH | Freq: Every day | TRANSDERMAL | Status: DC | PRN

## 2023-02-21 MED ORDER — DICLOFENAC SODIUM 1 % EX GEL
2.0000 g | Freq: Once | CUTANEOUS | Status: AC
  Administered 2023-02-22: 2 g via TOPICAL
  Filled 2023-02-21: qty 100

## 2023-02-21 MED ORDER — ACETAMINOPHEN 650 MG RE SUPP: 650.0000 mg | Freq: Four times a day (QID) | RECTAL | Status: DC | PRN

## 2023-02-21 NOTE — Progress Notes (Signed)
Patient ordered on CPAP @ HS. Patient stated "does not wear one at home and has never needed one before". Patient also stated "has never been officially diagnosed with OSA".

## 2023-02-21 NOTE — Assessment & Plan Note (Addendum)
As needed nicotine patch/Nicorette gum for nicotine craving ordered on admission > 3 minutes spent counseling patient on tobacco cessation and the benefits of quitting

## 2023-02-21 NOTE — ED Notes (Signed)
See triage notes. Patient c/o being short of breath and bilateral leg swelling times three days. The swelling is more so around the knees.

## 2023-02-21 NOTE — Assessment & Plan Note (Signed)
 CPAP nightly ordered

## 2023-02-21 NOTE — Assessment & Plan Note (Signed)
Presumed secondary to continued current tobacco use DuoNebs 4 times daily, 4 doses ordered on admission Continue with Solu-Medrol 40 mg IV daily starting on 02/22/2023, 1 dose ordered on admission Continuous pulse oximetry Maintain SpO2 greater than 92%

## 2023-02-21 NOTE — H&P (Addendum)
History and Physical   Justin Reid UJW:[id number] DOB: 09/28/1980 DOA: 02/21/2023  PCP: Pcp, No  Patient coming from: home  I have personally briefly reviewed patient's old medical records in [facility] EMR.  Chief Concern: Shortness of breath  HPI: [name] is a 43 year old male with history of morbid obesity, tobacco use, hypertension, who presents emergency department for chief concerns of shortness of breath for 3 days along with swelling of his lower extremities and worsening shortness of breath when laying flat.  Vitals in the ED showed T of 98.1, respiration rate 20, heart rate 81, blood pressure 159/81, SpO2 100% on room air.  Serum sodium is 136, potassium 2.5, chloride 103, bicarb 24, BUN of 12, serum creatinine 1.01, EGFR greater than 60, nonfasting blood glucose 102, WBC 7.3, hemoglobin 12.2, platelets of [birth year]1931.  CTA chest PE: Read as no embolism to the proximal subsegmental pulmonary artery level.    B/L lower extremity US: Was read as no evidence of femoral-popliteal DVT or superficial thrombophlebitis within the lower extremities.  ED treatment: DuoNebs, albuterol nebulizer, Solu-Medrol 125 mg IV one-time dose, acetaminophen 1000 mg p.o. one-time dose.  Magnesium 2 g IV one-time dose. --------------------------------- At bedside, patient is able to tell me his name, age, current location, current year.  He report he has been having shortness of breath for three days, he denies truama to his person, chest pain, fever, chills. He denies nausea, vomiting, dysuria.  He reports his shortness of breath has improved somewhat with the treatments in the ED.  He notes that over the last few days he has been using his rescue inhaler almost every day.  He reports he was not prescribed a long-acting inhaler.  He does not have a primary care doctor at this time and is currently working on this.  He reports he has been provided a list of primary care providers to call and  he states he will do this.  He reports that he smokes 1 pack of cigarette per week.  Sometimes he will not smoke at all in 1 day however usually he will smoke every other day or sometimes 1 to 2 cigarettes/day.  Social history: He lives at home with his girlfriend. He denies recreational drug use. He infrequently, etoh, one beer per week. He is a Midwife.   ROS: Constitutional: no weight change, no fever ENT/Mouth: no sore throat, no rhinorrhea Eyes: no eye pain, no vision changes Cardiovascular: no chest pain, + dyspnea,  no edema, no palpitations Respiratory: no cough, no sputum, + wheezing Gastrointestinal: no nausea, no vomiting, no diarrhea, no constipation Genitourinary: no urinary incontinence, no dysuria, no hematuria Musculoskeletal: no arthralgias, no myalgias Skin: no skin lesions, no pruritus, Neuro: no weakness, no loss of consciousness, no syncope Psych: no anxiety, no depression, no decrease appetite Heme/Lymph: no bruising, no bleeding  ED Course: Discussed with EDP, patient requiring hospitalization for chief concerns of asthma exacerbation.  Assessment/Plan  Principal Problem:   Asthma exacerbation Active Problems:   Asthma   Tobacco abuse   OSA (obstructive sleep apnea)   HTN (hypertension)   At risk for sleep apnea   Obesity hypoventilation syndrome (HCC)   Obesity, Class III, BMI 40-49.9 (morbid obesity) (HCC)   Assessment and Plan:  * Asthma exacerbation Presumed secondary to continued current tobacco use DuoNebs 4 times daily, 4 doses ordered on admission Continue with Solu-Medrol 40 mg IV daily starting on 02/22/2023, 1 dose ordered on admission Continuous pulse oximetry Maintain SpO2 greater  than 92%  Tobacco abuse As needed nicotine patch/Nicorette gum for nicotine craving ordered on admission > 3 minutes spent counseling patient on tobacco cessation and the benefits of quitting  Asthma Dulera 2 puff twice daily ordered on admission Patient  reports he currently does not have long-acting inhalers at home. He current does not have a primary care provider and is working on getting this set up. Patient may benefit from being discharged with home long-acting inhaler in addition to rescue inhaler in an effort to prevent inpatient hospitalization  HTN (hypertension) Hydralazine 5 mg IV q6h as needed for SBP > 170.  OSA (obstructive sleep apnea) High clinical suspicion for obstructive sleep apnea secondary to obesity hypoventilation syndrome, with neck circumference and truncal obesity Patient reports that he is working on getting a primary care doctor and he has a provider in mind. Extensive discussion regarding outpatient sleep study so that he can be treated should he have obstructive sleep apnea  Obesity, Class III, BMI 40-49.9 (morbid obesity) (HCC) This complicates overall care and prognosis.   Obesity hypoventilation syndrome (HCC) CPAP nightly ordered  Chart reviewed.   Hospitalization from 01/20/2023-01/21/2023: Patient was admitted for acute respiratory failure with hypoxia secondary to asthma exacerbation.  DVT prophylaxis: Enoxaparin subcutaneous every 24 hours weight-based dosing Code Status: full code Diet: Heart healthy Family Communication: discussed with his girlfriend Disposition Plan: Pending clinical course Consults called: None at this time Admission status: Telemetry medical, observation  Past Medical History:  Diagnosis Date   2019-nCoV vaccination declined 11/10/2020   Arthritis    Asthma    Hypertension    Shingles    History reviewed. No pertinent surgical history.  Social History:  reports that he has been smoking cigarettes. He has a 2.5 pack-year smoking history. He has never used smokeless tobacco. He reports current alcohol use. He reports that he does not use drugs.  No Known Allergies Family History  Problem Relation Age of Onset   Hypertension Father    Family history: Family  history reviewed and not pertinent.  Prior to Admission medications   Medication Sig Start Date End Date Taking? Authorizing Provider  albuterol (VENTOLIN HFA) 108 (90 Base) MCG/ACT inhaler Inhale 2 puffs into the lungs every 4 (four) hours as needed for wheezing or shortness of breath. 01/21/23 02/21/23 Yes Carollee Herter, DO  amLODipine (NORVASC) 5 MG tablet Take 1 tablet (5 mg total) by mouth daily. 01/21/23 04/21/23 Yes Chen, Eric, DO  fluticasone-salmeterol [facility]) 250-50 MCG/ACT AEPB Inhale 1 puff into the lungs in the morning and at bedtime. 01/21/23 04/21/23 Yes Carollee Herter, DO  ipratropium-albuterol (DUONEB) 0.5-2.5 (3) MG/3ML SOLN Take 3 mLs by nebulization every 4 (four) hours as needed. 01/21/23 02/21/23 Yes Carollee Herter, DO  ondansetron (ZOFRAN-ODT) 8 MG disintegrating tablet Take 1 tablet (8 mg total) by mouth every 8 (eight) hours as needed for nausea or vomiting. Patient not taking: Reported on 02/21/2023 02/03/23   Merwyn Katos, MD   Physical Exam: Vitals:   02/21/23 0809 02/21/23 1215 02/21/23 1216 02/21/23 1230  BP: (!) 146/120 (!) 140/87 (!) 140/87 (!) 159/81  Pulse: 89 80 79 81  Resp: (!) 22  20   Temp: 98.2 F (36.8 C)  98.1 F (36.7 C)   TempSrc:   Oral   SpO2: 93% 99% 96% 100%  Weight:      Height:       Constitutional: appears older than chronological age, NAD, calm Eyes: PERRL, lids and conjunctivae normal ENMT: Mucous  membranes are moist. Posterior pharynx clear of any exudate or lesions. Age-appropriate dentition. Hearing appropriate Neck: normal, supple, no masses, no thyromegaly Respiratory: Generalized and diffuse end expiratory wheezing on auscultation.  No crackles. Normal respiratory effort. No accessory muscle use.  Cardiovascular: Regular rate and rhythm, no murmurs / rubs / gallops. No extremity edema. 2+ pedal pulses. No carotid bruits.  Abdomen: Morbidly obese abdomen, no tenderness, no masses palpated, no hepatosplenomegaly. Bowel sounds positive.   Musculoskeletal: no clubbing / cyanosis. No joint deformity upper and lower extremities. Good ROM, no contractures, no atrophy. Normal muscle tone.  Skin: no rashes, lesions, ulcers. No induration Neurologic: Sensation intact. Strength 5/5 in all 4.  Psychiatric: Normal judgment and insight. Alert and oriented x 3. Normal mood.   EKG: independently reviewed, showing sinus rhythm with rate of 89, QTc 489  Chest x-ray on Admission: I personally reviewed and I agree with radiologist reading as below.  US Venous Img Lower Bilateral (DVT) Result Date: 02/21/2023 CLINICAL DATA:  144481 Leg swelling [id number] Patient c/o being short of breath and bilateral leg swelling times three days. The swelling is more so around the knees EXAM: BILATERAL LOWER EXTREMITY VENOUS DOPPLER ULTRASOUND TECHNIQUE: Gray-scale sonography with graded compression, as well as color Doppler and duplex ultrasound were performed to evaluate the lower extremity deep venous systems from the level of the common femoral vein and including the common femoral, femoral, profunda femoral, popliteal and calf veins including the posterior tibial, peroneal and gastrocnemius veins when visible. The superficial great saphenous vein was also interrogated. Spectral Doppler was utilized to evaluate flow at rest and with distal augmentation maneuvers in the common femoral, femoral and popliteal veins. COMPARISON:  CTA PE, 02/21/2023. LEFT lower extremity venous duplex, 11/25/2019 FINDINGS: RIGHT LOWER EXTREMITY VENOUS Normal compressibility of the RIGHT common femoral, superficial femoral, and popliteal veins, as well as the visualized calf veins. Visualized portions of profunda femoral vein and great saphenous vein unremarkable. No filling defects to suggest DVT on grayscale or color Doppler imaging. Doppler waveforms show normal direction of venous flow, normal respiratory plasticity and response to augmentation. OTHER No evidence of superficial  thrombophlebitis or abnormal fluid collection. Limitations: none LEFT LOWER EXTREMITY VENOUS Normal compressibility of the LEFT common femoral, superficial femoral, and popliteal veins, as well as the visualized calf veins. Visualized portions of profunda femoral vein and great saphenous vein unremarkable. No filling defects to suggest DVT on grayscale or color Doppler imaging. Doppler waveforms show normal direction of venous flow, normal respiratory plasticity and response to augmentation. OTHER No evidence of superficial thrombophlebitis or abnormal fluid collection. Limitations: none IMPRESSION: No evidence of femoropopliteal DVT or superficial thrombophlebitis within either lower extremity. Roanna Banning, MD [facility] Electronically Signed   By: Roanna Banning M.D.   On: 02/21/2023 11:40   CT Angio Chest PE W/Cm &/Or Wo Cm Result Date: 02/21/2023 CLINICAL DATA:  Pulmonary embolism (PE) suspected, high prob. Shortness of breath. Bilateral leg swelling. EXAM: CT ANGIOGRAPHY CHEST WITH CONTRAST TECHNIQUE: Multidetector CT imaging of the chest was performed using the standard protocol during bolus administration of intravenous contrast. Multiplanar CT image reconstructions and MIPs were obtained to evaluate the vascular anatomy. RADIATION DOSE REDUCTION: This exam was performed according to the departmental dose-optimization program which includes automated exposure control, adjustment of the mA and/or kV according to patient size and/or use of iterative reconstruction technique. CONTRAST:  75mL OMNIPAQUE IOHEXOL 350 MG/ML SOLN COMPARISON:  CT angiography chest from 01/03/2023. FINDINGS:  Cardiovascular: No evidence of embolism to the proximal subsegmental pulmonary artery level. Normal cardiac size. No pericardial effusion. No aortic aneurysm. Aberrant origin of right subclavian artery noted, which arises distal to the left subclavian artery and courses  towards the right side, posterior to the esophagus. Mediastinum/Nodes: Visualized thyroid gland appears grossly unremarkable. No solid / cystic mediastinal masses. The esophagus is nondistended precluding optimal assessment. No axillary, mediastinal or hilar lymphadenopathy by size criteria. Lungs/Pleura: The central tracheo-bronchial tree is patent. There are dependent changes in the bilateral lungs. No mass or consolidation. No pleural effusion or pneumothorax. No suspicious lung nodules. Upper Abdomen: There is a 2.4 x 2.4 cm partially exophytic simple cyst in the left kidney upper pole. Remaining visualized upper abdominal viscera within normal limits. Musculoskeletal: The visualized soft tissues of the chest wall are grossly unremarkable. No suspicious osseous lesions. There are mild multilevel degenerative changes in the visualized spine. Review of the MIP images confirms the above findings. IMPRESSION: 1. No embolism to the proximal subsegmental pulmonary artery level. 2. Multiple other nonacute observations, as described above. Electronically Signed   By: Jules Schick M.D.   On: 02/21/2023 11:19   DG Chest 2 View Result Date: 02/21/2023 CLINICAL DATA:  Chest pain and shortness of breath EXAM: CHEST - 2 VIEW COMPARISON:  Chest radiograph 02/02/2013 FINDINGS: The heart size and mediastinal contours are within normal limits. Both lungs are clear. The visualized skeletal structures are unremarkable. IMPRESSION: No active cardiopulmonary disease. Electronically Signed   By: Annia Belt M.D.   On: 02/21/2023 08:43   Labs on Admission: I have personally reviewed following labs  CBC: Recent Labs  Lab 02/21/23 0822  WBC 7.3  HGB 12.2*  HCT 38.4*  MCV 87.1  PLT 433*   Basic Metabolic Panel: Recent Labs  Lab 02/21/23 0822  NA 136  K 3.5  CL 103  CO2 24  GLUCOSE 102*  BUN 12  CREATININE 1.01  CALCIUM 8.6*   GFR: Estimated Creatinine Clearance: 131.9 mL/min (by C-G formula based on SCr of  1.01 mg/dL).  This document was prepared using Dragon Voice Recognition software and may include unintentional dictation errors.  Dr. Sedalia Muta [facility]  If 7PM-7AM, please contact overnight-coverage provider If 7AM-7PM, please contact day attending provider [email or web address]  02/21/2023, 1:33 PM

## 2023-02-21 NOTE — Assessment & Plan Note (Addendum)
Dulera 2 puff twice daily ordered on admission Patient reports he currently does not have long-acting inhalers at home. He current does not have a primary care provider and is working on getting this set up. Patient may benefit from being discharged with home long-acting inhaler in addition to rescue inhaler in an effort to prevent inpatient hospitalization

## 2023-02-21 NOTE — Hospital Course (Addendum)
 Hospital course / significant events:   HPI: [name] is a 43 year old male with history of morbid obesity, tobacco use, hypertension, who presents emergency department for chief concerns of shortness of breath for 3 days along with swelling of his lower extremities and worsening shortness of breath when laying flat.  02/17: to ED. CTA chest no PE, BL LE [place] no DVT, Tx Asthma/COPD exacerbation. Acute hypoxic resp fail  02/18-02/19: remains on supplemental O2 02/20: walk test today, per RN he was severe SOB on exertion and had to be placed in wheelchair brought back to his room, but SpO2 was not low on RA.  02/21: still substantial SOB on exertion  02/22: room air at rest, *** on exertion. TOC ***      Consultants:  none  Procedures/Surgeries: none      ASSESSMENT & PLAN:   Asthma exacerbation Shortness of breath  Acute hypoxis respiratory failure  Presumed secondary to continued current tobacco use Ruled out PE on admission Continue scheduled and as needed bronchodilator therapy DuoNebs Continue budesonide nebs, holding home Dulera for now  Steroids to po  Started Singulair  Maintain SpO2 greater than 92% Wean off oxygen as tolerated Repeat walk test to assess endurance/O2 need    Tobacco abuse Continue nicotine transdermal patch    HTN (hypertension) Continue amlodipine 5 mg daily    OSA (obstructive sleep apnea) High clinical suspicion for obstructive sleep apnea secondary to morbid obesity (BMI 44.30 kg/m2) Patient will need a sleep study as an outpatient for CPAP    Bilateral knee pain Likely osteoarthritis Follow-up with orthopedic surgery as an outpatient        Class 3 obesity based on BMI: Body mass index is 44.3 kg/m.  Underweight - under 18  overweight - 25 to 29 obese - 30 or more Class 1 obesity: BMI of 30.0 to 34 Class 2 obesity: BMI of 35.0 to 39 Class 3 obesity: BMI of 40.0 to 49 Super Morbid Obesity: BMI 50-59 Super-super  Morbid Obesity: BMI 60+ Significantly low or high BMI is associated with higher medical risk.  Weight management advised as adjunct to other disease management and risk reduction treatments    DVT prophylaxis: lovenox  IV fluids: no continuous IV fluids  Nutrition: cardaic diet Central lines / invasive devices: none  Code Status: FULL CODE ACP documentation reviewed: none on file in [facility]  TOC needs: medication assistance  Barriers to dispo / significant pending items: O2 requirement, weaning down as able, anticipate ambulatory walk test again tomorrow and see how he does

## 2023-02-21 NOTE — ED Provider Notes (Signed)
[facility] Provider Note    Event Date/Time   First MD Initiated Contact with Patient 02/21/23 [phone]     (approximate)   History   Shortness of Breath   HPI  Justin Reid is a 43 y.o. male with history of asthma, not on oxygen, intermittent smoker, obesity hypoventilation syndrome, hypertension, here for shortness of breath as well as bilateral knee pain.  States that he has had fluid in his knee in the past, states his doctor had wanted to drain it but he had declined.  States that he has had similar knee pain in the past.  No trauma or falls.  No redness over the knee.  For the shortness of breath, states that has been ongoing for last couple days as well.  No chest pain, he has a cough.  States that he feels like cannot lie down.  No fevers, no abdominal pain, no nausea, vomiting, diarrhea.  He denies prior history of CHF.   On independent review he was admitted in mid January for asthma exacerbation.  Had to be placed on BiPAP at the time.  Physical Exam   Triage Vital Signs: ED Triage Vitals  Encounter Vitals Group     BP 02/21/23 0809 (!) 146/120     Systolic BP Percentile --      Diastolic BP Percentile --      Pulse Rate 02/21/23 0809 89     Resp 02/21/23 0809 (!) 22     Temp 02/21/23 0809 98.2 F (36.8 C)     Temp src --      SpO2 02/21/23 0809 93 %     Weight 02/21/23 0807 300 lb (136.1 kg)     Height 02/21/23 0807 5\' 9"  (1.753 m)     Head Circumference --      Peak Flow --      Pain Score 02/21/23 0807 2     Pain Loc --      Pain Education --      Exclude from Growth Chart --     Most recent vital signs: Vitals:   02/21/23 1216 02/21/23 1230  BP: (!) 140/87 (!) 159/81  Pulse: 79 81  Resp: 20   Temp: 98.1 F (36.7 C)   SpO2: 96% 100%     General: Awake, no distress.  CV:  Good peripheral perfusion.  Resp:  Normal effort.  Tachypneic, no increased work of breathing, diminished breath sounds in the back, able to hear very  sparse wheezing in the front. Abd:  No distention.  Soft nontender Other:  No unilateral calf swelling or tenderness, no tenderness to palpation to the actual knees, there is some fullness to his R distal thigh without overlying erythema.  Able to range his knee and hip passively bilaterally.  Dorsi plantarflexion is intact bilaterally, palpable DP pulses bilaterally.  Sensation is intact and equal.  No obvious edema to his lower extremities.   ED Results / Procedures / Treatments   Labs (all labs ordered are listed, but only abnormal results are displayed) Labs Reviewed  BASIC METABOLIC PANEL - Abnormal; Notable for the following components:      Result Value   Glucose, Bld 102 (*)    Calcium 8.6 (*)    All other components within normal limits  CBC - Abnormal; Notable for the following components:   Hemoglobin 12.2 (*)    HCT 38.4 (*)    Platelets 433 (*)    All other  components within normal limits  RESP PANEL BY RT-PCR (RSV, FLU A&B, COVID)  RVPGX2  BRAIN NATRIURETIC PEPTIDE  TROPONIN I (HIGH SENSITIVITY)  TROPONIN I (HIGH SENSITIVITY)     EKG  Sinus rhythm with PACs, rate 89, normal QRS, T wave flattening to aVL, baseline is wandering but no obvious ischemic ST elevation, not significant change compared to prior   RADIOLOGY Chest x-ray on my interpretation without focal consolidation   PROCEDURES:  Critical Care performed: Yes, see critical care procedure note(s)  .Critical Care  Performed by: Claybon Jabs, MD Authorized by: Claybon Jabs, MD   Critical care provider statement:    Critical care time (minutes):  40   Critical care was necessary to treat or prevent imminent or life-threatening deterioration of the following conditions:  Respiratory failure   Critical care was time spent personally by me on the following activities:  Development of treatment plan with patient or surrogate, discussions with consultants, evaluation of patient's response to treatment,  examination of patient, ordering and review of laboratory studies, ordering and review of radiographic studies, ordering and performing treatments and interventions, pulse oximetry, re-evaluation of patient's condition and review of old charts    MEDICATIONS ORDERED IN ED: Medications  magnesium sulfate IVPB 2 g 50 mL (2 g Intravenous New Bag/Given 02/21/23 1221)  acetaminophen (TYLENOL) tablet 650 mg (has no administration in time range)    Or  acetaminophen (TYLENOL) suppository 650 mg (has no administration in time range)  ondansetron (ZOFRAN) tablet 4 mg (has no administration in time range)    Or  ondansetron (ZOFRAN) injection 4 mg (has no administration in time range)  enoxaparin (LOVENOX) injection 60 mg (has no administration in time range)  senna-docusate (Senokot-S) tablet 1 tablet (has no administration in time range)  ipratropium-albuterol (DUONEB) 0.5-2.5 (3) MG/3ML nebulizer solution 3 mL (has no administration in time range)  methylPREDNISolone sodium succinate (SOLU-MEDROL) 40 mg/mL injection 40 mg (has no administration in time range)  hydrALAZINE (APRESOLINE) injection 5 mg (has no administration in time range)  ipratropium-albuterol (DUONEB) 0.5-2.5 (3) MG/3ML nebulizer solution 9 mL (9 mLs Nebulization Given 02/21/23 0947)  methylPREDNISolone sodium succinate (SOLU-MEDROL) 125 mg/2 mL injection 125 mg (125 mg Intravenous Given 02/21/23 0945)  acetaminophen (TYLENOL) tablet 1,000 mg (1,000 mg Oral Given 02/21/23 0947)  iohexol (OMNIPAQUE) 350 MG/ML injection 75 mL (75 mLs Intravenous Contrast Given 02/21/23 1003)  albuterol (PROVENTIL) (2.5 MG/3ML) 0.083% nebulizer solution (10 mg/hr Nebulization Given 02/21/23 1209)     IMPRESSION / MDM / ASSESSMENT AND PLAN / ED COURSE  I reviewed the triage vital signs and the nursing notes.                              Differential diagnosis includes, but is not limited to, asthma exacerbation, viral illness, pneumonia, RSV, COVID,  influenza, ACS, considered PE since patient does not appear to be diffusely wheezing, he is tachypneic, will get a CT PE study.  The knee, doubt septic joint, I am able to range his knee, there is no overlying erythema, his bilateral knees do not appear swollen, considered DVT, arthritis, will get lower extremity ultrasounds, will get x-rays, Tylenol, labs, EKG, troponin, BNP, chest x-ray.  For the asthma we will give him some DuoNebs as well as Solu-Medrol.  Patient's presentation is most consistent with acute presentation with potential threat to life or bodily function.  Independent review of labs and imaging below.  Consult hospitalist for admission for asthma exacerbation.  He is admitted.  Clinical Course as of 02/21/23 1254  Mon Feb 21, 2023  1013 DG Chest 2 View IMPRESSION: No active cardiopulmonary disease.   [TT]  1050 Independent review of labs, troponin is normal, BNP is not elevated, no leukocytosis, electrolytes not severely deranged, creatinine is normal, respiratory viral panel is negative. [TT]  1143 CT Angio Chest PE W/Cm &/Or Wo Cm IMPRESSION: 1. No embolism to the proximal subsegmental pulmonary artery level. 2. Multiple other nonacute observations, as described above.     [TT]  1144 US Venous Img Lower Bilateral (DVT) IMPRESSION: No evidence of femoropopliteal DVT or superficial thrombophlebitis within either lower extremity.   [TT]  1154 On reexam patient states that the DuoNebs helped just a little bit, still tachypneic, on lung exam he is now diffusely wheezy.  Will give him continuous albuterol, IV mag, and plan to bring him into the hospital. [TT]    Clinical Course User Index [TT] Jodie Echevaria Franchot Erichsen, MD     FINAL CLINICAL IMPRESSION(S) / ED DIAGNOSES   Final diagnoses:  SOB (shortness of breath)  Renal cyst, left  Exacerbation of asthma, unspecified asthma severity, unspecified whether persistent  Pain in both knees, unspecified chronicity     Rx / DC  Orders   ED Discharge Orders          Ordered    Ambulatory Referral to Primary Care (Establish Care)        02/21/23 1144             Note:  This document was prepared using Dragon voice recognition software and may include unintentional dictation errors.    Claybon Jabs, MD 02/21/23 1254

## 2023-02-21 NOTE — ED Triage Notes (Signed)
Pt comes with c/o sob, bilateral legs swelling for about 3 days especially his knees. Pt states no cp. Pt states more sob when sleeping at night.   Pt states no cough, congestion or fever.

## 2023-02-21 NOTE — Assessment & Plan Note (Signed)
High clinical suspicion for obstructive sleep apnea secondary to obesity hypoventilation syndrome, with neck circumference and truncal obesity Patient reports that he is working on getting a primary care doctor and he has a provider in mind. Extensive discussion regarding outpatient sleep study so that he can be treated should he have obstructive sleep apnea

## 2023-02-21 NOTE — Assessment & Plan Note (Addendum)
Hydralazine 5 mg IV q6h as needed for SBP > 170.

## 2023-02-21 NOTE — Assessment & Plan Note (Signed)
 -  This complicates overall care and prognosis.

## 2023-02-22 ENCOUNTER — Inpatient Hospital Stay: Payer: Self-pay

## 2023-02-22 LAB — BASIC METABOLIC PANEL
Anion gap: 10 (ref 5–15)
BUN: 11 mg/dL (ref 6–20)
CO2: 25 mmol/L (ref 22–32)
Calcium: 8.8 mg/dL — ABNORMAL LOW (ref 8.9–10.3)
Chloride: 101 mmol/L (ref 98–111)
Creatinine, Ser: 0.99 mg/dL (ref 0.61–1.24)
GFR, Estimated: >60 mL/min (ref >60)
Glucose, Bld: 127 mg/dL — ABNORMAL HIGH (ref 70–99)
Potassium: 4.1 mmol/L (ref 3.5–5.1)
Sodium: 136 mmol/L (ref 135–145)

## 2023-02-22 LAB — CBC
HCT: 37.4 % — ABNORMAL LOW (ref 39.0–52.0)
Hemoglobin: 12.4 g/dL — ABNORMAL LOW (ref 13.0–17.0)
MCH: 28.6 pg (ref 26.0–34.0)
MCHC: 33.2 g/dL (ref 30.0–36.0)
MCV: 86.4 fL (ref 80.0–100.0)
Platelets: 466 10*3/uL — ABNORMAL HIGH (ref 150–400)
RBC: 4.33 MIL/uL (ref 4.22–5.81)
RDW: 13.9 % (ref 11.5–15.5)
WBC: 10.1 10*3/uL (ref 4.0–10.5)
nRBC: 0 % (ref 0.0–0.2)

## 2023-02-22 LAB — GLUCOSE, CAPILLARY: Glucose-Capillary: 136 mg/dL — ABNORMAL HIGH (ref 70–99)

## 2023-02-22 LAB — BRAIN NATRIURETIC PEPTIDE: B Natriuretic Peptide: 32.2 pg/mL (ref 0.0–100.0)

## 2023-02-22 MED ORDER — GUAIFENESIN 100 MG/5ML PO LIQD
5.0000 mL | ORAL | Status: DC | PRN
  Administered 2023-02-22 – 2023-02-23 (×3): 5 mL via ORAL
  Filled 2023-02-22 (×3): qty 10

## 2023-02-22 MED ORDER — TRAMADOL HCL 50 MG PO TABS
50.0000 mg | ORAL_TABLET | Freq: Four times a day (QID) | ORAL | Status: DC | PRN
  Administered 2023-02-22 – 2023-02-25 (×8): 50 mg via ORAL
  Filled 2023-02-22 (×8): qty 1

## 2023-02-22 MED ORDER — METHYLPREDNISOLONE SODIUM SUCC 40 MG IJ SOLR
40.0000 mg | Freq: Two times a day (BID) | INTRAMUSCULAR | Status: AC
  Administered 2023-02-22: 40 mg via INTRAVENOUS
  Filled 2023-02-22: qty 1

## 2023-02-22 MED ORDER — PANTOPRAZOLE SODIUM 40 MG PO TBEC
40.0000 mg | DELAYED_RELEASE_TABLET | Freq: Every day | ORAL | Status: DC
  Administered 2023-02-22 – 2023-02-26 (×5): 40 mg via ORAL
  Filled 2023-02-22 (×5): qty 1

## 2023-02-22 MED ORDER — IPRATROPIUM-ALBUTEROL 0.5-2.5 (3) MG/3ML IN SOLN
3.0000 mL | Freq: Four times a day (QID) | RESPIRATORY_TRACT | Status: DC
  Administered 2023-02-22 – 2023-02-23 (×4): 3 mL via RESPIRATORY_TRACT
  Filled 2023-02-22 (×4): qty 3

## 2023-02-22 NOTE — Plan of Care (Signed)

## 2023-02-22 NOTE — Progress Notes (Addendum)
  Progress Note   Patient: Justin Reid:[id number] DOB: 1980/04/23 DOA: 02/21/2023     0 DOS: the patient was seen and examined on 02/22/2023   Brief hospital course: Justin Reid is a 43 year old male with history of morbid obesity, tobacco use, hypertension, who presents emergency department for chief concerns of shortness of breath for 3 days associated with a dry cough and wheezing.  He also complains of pain in both knees.     Assessment and Plan:  * Asthma exacerbation Presumed secondary to continued current tobacco use Continue scheduled and as needed bronchodilator therapy Continue with Solu-Medrol 40 mg IV twice daily as well as inhaled steroids Patient remains on oxygen supplementation at 2 L Maintain SpO2 greater than 92% Wean off oxygen as tolerated    Tobacco abuse Continue nicotine transdermal patch      HTN (hypertension) Continue amlodipine 5 mg daily    OSA (obstructive sleep apnea) High clinical suspicion for obstructive sleep apnea secondary to morbid obesity (BMI 44.30 kg/m2) Patient will need a sleep study as an outpatient for CPAP    Morbid obesity, Class III, BMI 40-49.9 (morbid obesity) (HCC) This complicates overall care and prognosis.  Lifestyle modification and exercise has been discussed with patient in detail    Bilateral knee pain ??  Arthritis Follow-up with orthopedic surgery as an outpatient      Subjective: Patient is seen and examined at the bedside.  Remains short of breath and has scattered wheezing on exam  Physical Exam: Vitals:   02/22/23 0510 02/22/23 0736 02/22/23 0910 02/22/23 1134  BP: (!) 157/84  (!) 124/95 132/82  Pulse: 91  86 83  Resp: 20  20 20   Temp: 97.8 F (36.6 C)  97.9 F (36.6 C) (!) 97.5 F (36.4 C)  TempSrc:      SpO2: 95% 99% 96% 96%  Weight:      Height:       Constitutional: appears older than chronological age, NAD, calm Eyes: PERRL, lids and conjunctivae normal ENMT: Mucous  membranes are moist. Posterior pharynx clear of any exudate or lesions. Age-appropriate dentition. Hearing appropriate Neck: normal, supple, no masses, no thyromegaly Respiratory: Generalized and scattered end expiratory wheezing on auscultation.  No crackles. Normal respiratory effort. No accessory muscle use.  Cardiovascular: Regular rate and rhythm, no murmurs / rubs / gallops. No extremity edema. 2+ pedal pulses. No carotid bruits.  Abdomen: Morbidly obese abdomen, no tenderness, no masses palpated, no hepatosplenomegaly. Bowel sounds positive.  Musculoskeletal: no clubbing / cyanosis. No joint deformity upper and lower extremities. Good ROM, no contractures, no atrophy. Normal muscle tone.  Skin: no rashes, lesions, ulcers. No induration Neurologic: Sensation intact. Strength 5/5 in all 4.  Psychiatric: Normal judgment and insight. Alert and oriented x 3. Normal mood.    Data Reviewed: Labs reviewed There are no new results to review at this time.  Family Communication: Plan of care discussed with patient and his girlfriend at the bedside.  All questions and concerns have been addressed.  They verbalized understanding and agree with the plan.  Disposition: Status is: Inpatient Remains inpatient appropriate because: Acute asthma exacerbation  Planned Discharge Destination: Home    Time spent: 36 minutes  Author: Lucile Shutters, MD 02/22/2023 12:29 PM  For on call review [email or web address].

## 2023-02-22 NOTE — Plan of Care (Signed)
  Problem: Education: Goal: Knowledge of General Education information will improve Description Including pain rating scale, medication(s)/side effects and non-pharmacologic comfort measures Outcome: Progressing   Problem: Elimination: Goal: Will not experience complications related to urinary retention Outcome: Progressing   Problem: Safety: Goal: Ability to remain free from injury will improve Outcome: Progressing   

## 2023-02-23 MED ORDER — GUAIFENESIN ER 600 MG PO TB12
1200.0000 mg | ORAL_TABLET | Freq: Two times a day (BID) | ORAL | Status: DC
  Administered 2023-02-23 – 2023-02-25 (×6): 1200 mg via ORAL
  Filled 2023-02-23 (×7): qty 2

## 2023-02-23 MED ORDER — IPRATROPIUM-ALBUTEROL 0.5-2.5 (3) MG/3ML IN SOLN
3.0000 mL | Freq: Four times a day (QID) | RESPIRATORY_TRACT | Status: DC
  Administered 2023-02-23 – 2023-02-24 (×4): 3 mL via RESPIRATORY_TRACT
  Filled 2023-02-23 (×4): qty 3

## 2023-02-23 NOTE — Progress Notes (Signed)
PROGRESS NOTE    Justin Reid   ZOX:[id number] DOB: [birth year]  DOA: 02/21/2023 Date of Service: 02/23/23 which is hospital day 1  PCP: Pcp, No    Hospital course / significant events:   HPI: [name] is a 43 year old male with history of morbid obesity, tobacco use, hypertension, who presents emergency department for chief concerns of shortness of breath for 3 days along with swelling of his lower extremities and worsening shortness of breath when laying flat.  02/17: to ED. CTA chest no PE, BL LE [place] no DVT, Tx Asthma/COPD exacerbation. Acute hypoxic resp fail  02/18: remains on supplemental O2     Consultants:  none  Procedures/Surgeries: none      ASSESSMENT & PLAN:   Asthma exacerbation Presumed secondary to continued current tobacco use Continue scheduled and as needed bronchodilator therapy Continue with Solu-Medrol 40 mg IV twice daily as well as inhaled steroids Patient remains on oxygen supplementation at 2 L Maintain SpO2 greater than 92% Wean off oxygen as tolerated    Tobacco abuse Continue nicotine transdermal patch    HTN (hypertension) Continue amlodipine 5 mg daily    OSA (obstructive sleep apnea) High clinical suspicion for obstructive sleep apnea secondary to morbid obesity (BMI 44.30 kg/m2) Patient will need a sleep study as an outpatient for CPAP    Bilateral knee pain Likely osteoarthritis Follow-up with orthopedic surgery as an outpatient        Class 3 obesity based on BMI: Body mass index is 44.3 kg/m.  Underweight - under 18  overweight - 25 to 29 obese - 30 or more Class 1 obesity: BMI of 30.0 to 34 Class 2 obesity: BMI of 35.0 to 39 Class 3 obesity: BMI of 40.0 to 49 Super Morbid Obesity: BMI 50-59 Super-super Morbid Obesity: BMI 60+ Significantly low or high BMI is associated with higher medical risk.  Weight management advised as adjunct to other disease management and risk reduction treatments    DVT  prophylaxis: lovenox  IV fluids: no continuous IV fluids  Nutrition: cardaic diet Central lines / invasive devices: none  Code Status: FULL CODE ACP documentation reviewed: none on file in [facility]  TOC needs: medication assistance  Barriers to dispo / significant pending items: O2 requirement, weaning down as able, anticipate ambulatory walk test tomorrow and see how he does              Subjective / Brief ROS:  Patient reports still SOB some today  Denies CP/SOB at rest   Pain controlled.  Denies new weakness.  Tolerating diet.  Reports no concerns w/ urination/defecation.   Family Communication: none at this time     Objective Findings:  Vitals:   02/23/23 0738 02/23/23 0850 02/23/23 1207 02/23/23 1402  BP:  (!) 147/94 (!) 145/98   Pulse:  80 89   Resp:  19 20   Temp:  (!) 97.4 F (36.3 C) 97.9 F (36.6 C)   TempSrc:      SpO2: 97% 94% 93% 92%  Weight:      Height:        Intake/Output Summary (Last 24 hours) at 02/23/2023 1611 Last data filed at 02/23/2023 0600 Gross per 24 hour  Intake 480 ml  Output --  Net 480 ml   Filed Weights   02/21/23 0807  Weight: 136.1 kg    Examination:  Physical Exam Constitutional:      General: He is not in acute distress.    Appearance:  He is well-developed.  Cardiovascular:     Rate and Rhythm: Normal rate and regular rhythm.  Pulmonary:     Effort: Pulmonary effort is normal.     Breath sounds: Decreased breath sounds present.     Comments: Wearing CPAP at time of exam  Musculoskeletal:     Right lower leg: No edema.     Left lower leg: No edema.  Skin:    General: Skin is warm and dry.  Neurological:     General: No focal deficit present.     Mental Status: He is alert.  Psychiatric:        Mood and Affect: Mood normal.        Behavior: Behavior normal.          Scheduled Medications:   amLODipine  5 mg Oral Daily   enoxaparin (LOVENOX) injection  60 mg Subcutaneous Q24H   guaiFENesin   1,200 mg Oral BID   ipratropium-albuterol  3 mL Nebulization Q6H   loratadine  10 mg Oral Daily   mometasone-formoterol  2 puff Inhalation BID   pantoprazole  40 mg Oral Daily    Continuous Infusions:   PRN Medications:  acetaminophen **OR** acetaminophen, alum & mag hydroxide-simeth, hydrALAZINE, nicotine **OR** nicotine polacrilex, ondansetron **OR** ondansetron (ZOFRAN) IV, senna-docusate, traMADol  Antimicrobials from admission:  Anti-infectives (From admission, onward)    None           Data Reviewed:  I have personally reviewed the following...  CBC: Recent Labs  Lab 02/21/23 0822 02/22/23 0547  WBC 7.3 10.1  HGB 12.2* 12.4*  HCT 38.4* 37.4*  MCV 87.1 86.4  PLT 433* 466*   Basic Metabolic Panel: Recent Labs  Lab 02/21/23 0822 02/22/23 0547  NA 136 136  K 3.5 4.1  CL 103 101  CO2 24 25  GLUCOSE 102* 127*  BUN 12 11  CREATININE 1.01 0.99  CALCIUM 8.6* 8.8*   GFR: Estimated Creatinine Clearance: 134.6 mL/min (by C-G formula based on SCr of 0.99 mg/dL). Liver Function Tests: No results for input(s): "AST", "ALT", "ALKPHOS", "BILITOT", "PROT", "ALBUMIN" in the last 168 hours. No results for input(s): "LIPASE", "AMYLASE" in the last 168 hours. No results for input(s): "AMMONIA" in the last 168 hours. Coagulation Profile: No results for input(s): "INR", "PROTIME" in the last 168 hours. Cardiac Enzymes: No results for input(s): "CKTOTAL", "CKMB", "CKMBINDEX", "TROPONINI" in the last 168 hours. BNP (last 3 results) No results for input(s): "PROBNP" in the last 8760 hours. HbA1C: No results for input(s): "HGBA1C" in the last 72 hours. CBG: Recent Labs  Lab 02/22/23 1136  GLUCAP 136*   Lipid Profile: No results for input(s): "CHOL", "HDL", "LDLCALC", "TRIG", "CHOLHDL", "LDLDIRECT" in the last 72 hours. Thyroid Function Tests: No results for input(s): "TSH", "T4TOTAL", "FREET4", "T3FREE", "THYROIDAB" in the last 72 hours. Anemia Panel: No  results for input(s): "VITAMINB12", "FOLATE", "FERRITIN", "TIBC", "IRON", "RETICCTPCT" in the last 72 hours. Most Recent Urinalysis On File:  No results found for: "COLORURINE", "APPEARANCEUR", "LABSPEC", "PHURINE", "GLUCOSEU", "HGBUR", "BILIRUBINUR", "KETONESUR", "PROTEINUR", "UROBILINOGEN", "NITRITE", "LEUKOCYTESUR" Sepsis Labs: @LABRCNTIP (procalcitonin:4,lacticidven:4) Microbiology: Recent Results (from the past 240 hours)  Resp panel by RT-PCR (RSV, Flu A&B, Covid) Anterior Nasal Swab     Status: None   Collection Time: 02/21/23  8:24 AM   Specimen: Anterior Nasal Swab  Result Value Ref Range Status   SARS Coronavirus 2 by RT PCR NEGATIVE NEGATIVE Final    Comment: (NOTE) SARS-CoV-2 target nucleic acids are NOT DETECTED.  The SARS-CoV-2  RNA is generally detectable in upper respiratory specimens during the acute phase of infection. The lowest concentration of SARS-CoV-2 viral copies this assay can detect is 138 copies/mL. A negative result does not preclude SARS-Cov-2 infection and should not be used as the sole basis for treatment or other patient management decisions. A negative result may occur with  improper specimen collection/handling, submission of specimen other than nasopharyngeal swab, presence of viral mutation(s) within the areas targeted by this assay, and inadequate number of viral copies(<138 copies/mL). A negative result must be combined with clinical observations, patient history, and epidemiological information. The expected result is Negative.  Fact Sheet for Patients:  BloggerCourse.com  Fact Sheet for Healthcare Providers:  SeriousBroker.it  This test is no t yet approved or cleared by the [place] FDA and  has been authorized for detection and/or diagnosis of SARS-CoV-2 by FDA under an Emergency Use Authorization (EUA). This EUA will remain  in effect (meaning this test can be used) for the duration of  the COVID-19 declaration under Section 564(b)(1) of the Act, 21 U.S.C.section 360bbb-3(b)(1), unless the authorization is terminated  or revoked sooner.       Influenza A by PCR NEGATIVE NEGATIVE Final   Influenza B by PCR NEGATIVE NEGATIVE Final    Comment: (NOTE) The Xpert Xpress SARS-CoV-2/FLU/RSV plus assay is intended as an aid in the diagnosis of influenza from Nasopharyngeal swab specimens and should not be used as a sole basis for treatment. Nasal washings and aspirates are unacceptable for Xpert Xpress SARS-CoV-2/FLU/RSV testing.  Fact Sheet for Patients: BloggerCourse.com  Fact Sheet for Healthcare Providers: SeriousBroker.it  This test is not yet approved or cleared by the [place] FDA and has been authorized for detection and/or diagnosis of SARS-CoV-2 by FDA under an Emergency Use Authorization (EUA). This EUA will remain in effect (meaning this test can be used) for the duration of the COVID-19 declaration under Section 564(b)(1) of the Act, 21 U.S.C. section 360bbb-3(b)(1), unless the authorization is terminated or revoked.     Resp Syncytial Virus by PCR NEGATIVE NEGATIVE Final    Comment: (NOTE) Fact Sheet for Patients: BloggerCourse.com  Fact Sheet for Healthcare Providers: SeriousBroker.it  This test is not yet approved or cleared by the [place] FDA and has been authorized for detection and/or diagnosis of SARS-CoV-2 by FDA under an Emergency Use Authorization (EUA). This EUA will remain in effect (meaning this test can be used) for the duration of the COVID-19 declaration under Section 564(b)(1) of the Act, 21 U.S.C. section 360bbb-3(b)(1), unless the authorization is terminated or revoked.  Performed at [facility], [address] [place], [place] [address]       Radiology Studies last 3 days: [facility] Chest [facility] 1 View Result  Date: 02/22/2023 CLINICAL DATA:  Shortness of breath. EXAM: PORTABLE CHEST 1 VIEW COMPARISON:  Radiograph and CT yesterday FINDINGS: The cardiomediastinal contours are stable. Minor bibasilar atelectasis. Pulmonary vasculature is normal. No confluent consolidation, pleural effusion, or pneumothorax. No acute osseous abnormalities are seen. IMPRESSION: Minor bibasilar atelectasis. Electronically Signed   By: Narda Rutherford M.D.   On: 02/22/2023 18:19   DG Knee Complete 4 Views Right Result Date: 02/21/2023 CLINICAL DATA:  Knee pain.  Swelling. EXAM: RIGHT KNEE - COMPLETE 4+ VIEW COMPARISON:  05/08/2020. FINDINGS: No acute fracture or dislocation. No aggressive osseous lesion. There are degenerative changes of the knee joint in the form of reduced medial tibio-femoral compartment joint space and tricompartmental osteophytosis. No knee effusion or focal soft  tissue swelling. No radiopaque foreign bodies. IMPRESSION: *No acute osseous abnormality of the right knee. Mild-to-moderate degenerative changes. Electronically Signed   By: Jules Schick M.D.   On: 02/21/2023 14:13   DG Knee Complete 4 Views Left Result Date: 02/21/2023 CLINICAL DATA:  Left knee pain.  Leg swelling. EXAM: LEFT KNEE - COMPLETE 4+ VIEW COMPARISON:  05/08/2020. FINDINGS: No acute fracture or dislocation. No aggressive osseous lesion. There are degenerative changes of the knee joint in the form of mildly reduced medial tibio-femoral compartment joint space, tibial spiking and tricompartmental osteophytosis. No knee effusion or focal soft tissue swelling. No radiopaque foreign bodies. IMPRESSION: *No acute osseous abnormality of the left knee. 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 Electronically Signed   By: Jules Schick M.D.   On: 02/21/2023 14:12   US Venous Img Lower Bilateral (DVT) Result Date: 02/21/2023 CLINICAL DATA:  [id number] Leg swelling [id number] Patient c/o being  short of breath and bilateral leg swelling times three days. The swelling is more so around the knees EXAM: BILATERAL LOWER EXTREMITY VENOUS DOPPLER ULTRASOUND TECHNIQUE: Gray-scale sonography with graded compression, as well as color Doppler and duplex ultrasound were performed to evaluate the lower extremity deep venous systems from the level of the common femoral vein and including the common femoral, femoral, profunda femoral, popliteal and calf veins including the posterior tibial, peroneal and gastrocnemius veins when visible. The superficial great saphenous vein was also interrogated. Spectral Doppler was utilized to evaluate flow at rest and with distal augmentation maneuvers in the common femoral, femoral and popliteal veins. COMPARISON:  CTA PE, 02/21/2023. LEFT lower extremity venous duplex, 11/25/2019 FINDINGS: RIGHT LOWER EXTREMITY VENOUS Normal compressibility of the RIGHT common femoral, superficial femoral, and popliteal veins, as well as the visualized calf veins. Visualized portions of profunda femoral vein and great saphenous vein unremarkable. No filling defects to suggest DVT on grayscale or color Doppler imaging. Doppler waveforms show normal direction of venous flow, normal respiratory plasticity and response to augmentation. OTHER No evidence of superficial thrombophlebitis or abnormal fluid collection. Limitations: none LEFT LOWER EXTREMITY VENOUS Normal compressibility of the LEFT common femoral, superficial femoral, and popliteal veins, as well as the visualized calf veins. Visualized portions of profunda femoral vein and great saphenous vein unremarkable. No filling defects to suggest DVT on grayscale or color Doppler imaging. Doppler waveforms show normal direction of venous flow, normal respiratory plasticity and response to augmentation. OTHER No evidence of superficial thrombophlebitis or abnormal fluid collection. Limitations: none IMPRESSION: No evidence of femoropopliteal DVT or  superficial thrombophlebitis within either lower extremity. Roanna Banning, MD [facility] Radiology Electronically Signed   By: Roanna Banning M.D.   On: 02/21/2023 11:40   CT Angio Chest PE W/Cm &/Or Wo Cm Result Date: 02/21/2023 CLINICAL DATA:  Pulmonary embolism (PE) suspected, high prob. Shortness of breath. Bilateral leg swelling. EXAM: CT ANGIOGRAPHY CHEST WITH CONTRAST TECHNIQUE: Multidetector CT imaging of the chest was performed using the standard protocol during bolus administration of intravenous contrast. Multiplanar CT image reconstructions and MIPs were obtained to evaluate the vascular anatomy. RADIATION DOSE REDUCTION: This exam was performed according to the departmental dose-optimization program which includes automated exposure control, adjustment of the mA and/or kV according to patient size and/or use of iterative reconstruction technique. CONTRAST:  75mL OMNIPAQUE IOHEXOL 350 MG/ML SOLN COMPARISON:  CT angiography chest from 01/03/2023. FINDINGS: Cardiovascular: No evidence of embolism to the proximal subsegmental pulmonary artery level. Normal cardiac size. No pericardial  effusion. No aortic aneurysm. 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 Mediastinum/Nodes: Visualized thyroid gland appears grossly unremarkable. No solid / cystic mediastinal masses. The esophagus is nondistended precluding optimal assessment. No axillary, mediastinal or hilar lymphadenopathy by size criteria. Lungs/Pleura: The central tracheo-bronchial tree is patent. There are dependent changes in the bilateral lungs. No mass or consolidation. No pleural effusion or pneumothorax. No suspicious lung nodules. Upper Abdomen: There is a 2.4 x 2.4 cm partially exophytic simple cyst in the left kidney upper pole. Remaining visualized upper abdominal viscera  within normal limits. Musculoskeletal: The visualized soft tissues of the chest wall are grossly unremarkable. No suspicious osseous lesions. There are mild multilevel degenerative changes in the visualized spine. Review of the MIP images confirms the above findings. IMPRESSION: 1. No embolism to the proximal subsegmental pulmonary artery level. 2. Multiple other nonacute observations, as described above. Electronically Signed   By: Jules Schick M.D.   On: 02/21/2023 11:19   DG Chest 2 View Result Date: 02/21/2023 CLINICAL DATA:  Chest pain and shortness of breath EXAM: CHEST - 2 VIEW COMPARISON:  Chest radiograph 02/02/2013 FINDINGS: The heart size and mediastinal contours are within normal limits. Both lungs are clear. The visualized skeletal structures are unremarkable. IMPRESSION: No active cardiopulmonary disease. Electronically Signed   By: Annia Belt M.D.   On: 02/21/2023 08:43          Sunnie Nielsen, DO [facility] 02/23/2023, 4:11 PM    Dictation software may have been used to generate the above note. Typos may occur and escape review in typed/dictated notes. Please contact Dr Lyn Hollingshead directly for clarity if needed.  Staff may message me via secure chat in Epic  but this may not receive an immediate response,  please page me for urgent matters!  If 7PM-7AM, please contact night coverage [email or web address]

## 2023-02-23 NOTE — Plan of Care (Signed)

## 2023-02-23 NOTE — Plan of Care (Signed)

## 2023-02-23 NOTE — Plan of Care (Signed)

## 2023-02-24 MED ORDER — BUDESONIDE 0.25 MG/2ML IN SUSP
0.2500 mg | Freq: Two times a day (BID) | RESPIRATORY_TRACT | Status: DC
  Administered 2023-02-24 – 2023-02-26 (×5): 0.25 mg via RESPIRATORY_TRACT
  Filled 2023-02-24 (×5): qty 2

## 2023-02-24 MED ORDER — PREDNISONE 50 MG PO TABS
50.0000 mg | ORAL_TABLET | Freq: Every day | ORAL | Status: AC
  Administered 2023-02-24 – 2023-02-26 (×3): 50 mg via ORAL
  Filled 2023-02-24 (×3): qty 1

## 2023-02-24 MED ORDER — IPRATROPIUM-ALBUTEROL 0.5-2.5 (3) MG/3ML IN SOLN
3.0000 mL | RESPIRATORY_TRACT | Status: DC | PRN
  Administered 2023-02-25 – 2023-02-26 (×3): 3 mL via RESPIRATORY_TRACT
  Filled 2023-02-24 (×2): qty 3

## 2023-02-24 MED ORDER — IPRATROPIUM-ALBUTEROL 0.5-2.5 (3) MG/3ML IN SOLN
3.0000 mL | Freq: Four times a day (QID) | RESPIRATORY_TRACT | Status: DC
  Administered 2023-02-24 – 2023-02-26 (×6): 3 mL via RESPIRATORY_TRACT
  Filled 2023-02-24 (×7): qty 3

## 2023-02-24 NOTE — Evaluation (Signed)
Physical Therapy Evaluation & Discharge  Patient Details Name: Justin Reid MRN: [id number] DOB: [birth year] Today's Date: 02/24/2023  History of Present Illness  43 y/o male presented to ED on 02/21/23 for SOB and B leg swelling x 3 days. Admitted for acute hypoxic respiratory failure and asthma exacerbation. PMH: HTN, OSA, tobacco abuse, morbid obesity  Clinical Impression  Patient admitted with the above. PTA, patient lives with girlfriend and reports independence with no AD. Patient currently continues to function at independent level although experiencing 3/4 DOE with any mobility. VSS on RA throughout mobility. Educated patient on energy conservation technique and activity pacing due to current activity tolerance, patient verbalized understanding. Would benefit from cardiac rehab as well as patient requesting OPPT referral for B knee pain. No acute skilled PT needs identified. PT will sign off at this time. Defer further mobility to mobility specialists and nursing staff.         If plan is discharge home, recommend the following:     Can travel by private vehicle        Equipment Recommendations None recommended by PT  Recommendations for Other Services       Functional Status Assessment Patient has not had a recent decline in their functional status     Precautions / Restrictions Precautions Precautions: None Restrictions Weight Bearing Restrictions Per Provider Order: No      Mobility  Bed Mobility               General bed mobility comments: not observed during session    Transfers Overall transfer level: Independent                      Ambulation/Gait Ambulation/Gait assistance: Independent Gait Distance (Feet): 60 Feet Assistive device: None Gait Pattern/deviations: Step-through pattern, Decreased stride length, Decreased stance time - left, Decreased stance time - right Gait velocity: decreased     General Gait Details: spO2 >94%  throughout. 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, music Rankin (Stroke Patients Only)       Balance Overall balance assessment: Modified Independent                                           Pertinent Vitals/Pain Pain Assessment Pain Assessment: Faces Faces Pain Scale: Hurts even more Pain Location: L flank Pain Descriptors / Indicators: Discomfort, Cramping Pain Intervention(s): Limited activity within patient's tolerance, Monitored during session, Repositioned    Home Living Family/patient expects to be discharged to:: Private residence Living Arrangements: Spouse/significant other Available Help at Discharge: Family Type of Home: House Home Access: Stairs to enter Entrance Stairs-Rails: None Entrance Stairs-Number of Steps: 2   Home Layout: One level Home Equipment: Agricultural consultant (2 wheels);Cane - single point      Prior Function Prior Level of Function : Independent/Modified Independent                     Extremity/Trunk Assessment   Upper Extremity Assessment Upper Extremity Assessment: Overall WFL for tasks assessed    Lower Extremity Assessment Lower Extremity Assessment: Overall WFL for tasks assessed    Cervical / Trunk Assessment Cervical / Trunk Assessment: Normal  Communication   Communication Communication: No apparent difficulties    Cognition Arousal: Alert  Behavior During Therapy: WFL for tasks assessed/performed   PT - Cognitive impairments: No apparent impairments                         Following commands: Intact       Cueing       General Comments      Exercises     Assessment/Plan    PT Assessment Patient does not need any further PT services  PT Problem List         PT Treatment Interventions      PT Goals (Current goals can be found in the Care Plan section)  Acute Rehab PT Goals Patient Stated Goal: to breathe better PT Goal  Formulation: All assessment and education complete, DC therapy    Frequency       Co-evaluation               AM-PAC PT "6 Clicks" Mobility  Outcome Measure Help needed turning from your back to your side while in a flat bed without using bedrails?: None Help needed moving from lying on your back to sitting on the side of a flat bed without using bedrails?: None Help needed moving to and from a bed to a chair (including a wheelchair)?: None Help needed standing up from a chair using your arms (e.g., wheelchair or bedside chair)?: None Help needed to walk in hospital room?: None Help needed climbing 3-5 steps with a railing? : None 6 Click Score: 24    End of Session   Activity Tolerance: Patient tolerated treatment well Patient left: in chair;with call bell/phone within reach Nurse Communication: Mobility status PT Visit Diagnosis: Muscle weakness (generalized) (M62.81)    Time: 1610-9604 PT Time Calculation (min) (ACUTE ONLY): 17 min   Charges:   PT Evaluation $PT Eval Moderate Complexity: 1 Mod   PT General Charges $$ ACUTE PT VISIT: 1 Visit         Justin Reid, PT, DPT Physical Therapist - [facility]  [facility]   Harald Quevedo A Brilynn Biasi 02/24/2023, 4:00 PM

## 2023-02-24 NOTE — Progress Notes (Signed)
Referral received from bedside RN. Pt and wife, at bedside, introduced to nurse navigator. Intake questions completed.  Patient and wife reside in a rental. Currently their electric is turned off due to failure to pay. Patient has been unable to work due to medical conditions. Patient's wife recently started working at [employer], but will not receive a pay check until mid-March.  Patient provided with application for housing authority. Application submitted.  Information for [employer], food resources and possible assistance with utilities provided.   Will reach out to [facility] supervisor regarding possibility of funds.

## 2023-02-24 NOTE — Progress Notes (Signed)
PROGRESS NOTE    Justin Reid   ZOX:[id number] DOB: [birth year]  DOA: 02/21/2023 Date of Service: 02/24/23 which is hospital day 2  PCP: Pcp, No    Hospital course / significant events:   HPI: Justin Reid is a 43 year old male with history of morbid obesity, tobacco use, hypertension, who presents emergency department for chief concerns of shortness of breath for 3 days along with swelling of his lower extremities and worsening shortness of breath when laying flat.  02/17: to ED. CTA chest no PE, BL LE [place] no DVT, Tx Asthma/COPD exacerbation. Acute hypoxic resp fail  02/18-02/19: remains on supplemental O2 02/20: walk test today, per RN he was severe SOB on exertion and had to be placed in wheelchair brought back to his room, but SpO2 was not low on RA.      Consultants:  none  Procedures/Surgeries: none      ASSESSMENT & PLAN:   Asthma exacerbation Shortness of breath  Acute hypoxis respiratory failure  Presumed secondary to continued current tobacco use Ruled out PE on admission Continue scheduled and as needed bronchodilator therapy DuoNebs Added budesonide nebs, holding home Dulera for now  Steroids to po  Patient remains on oxygen supplementation at 2 L w/ severe SOB on minimal exertion  Maintain SpO2 greater than 92% Wean off oxygen as tolerated Repeat walk test tomorrow to assess endurance/O2 need, PT eval suspect some component of deconditioning     Tobacco abuse Continue nicotine transdermal patch    HTN (hypertension) Continue amlodipine 5 mg daily    OSA (obstructive sleep apnea) High clinical suspicion for obstructive sleep apnea secondary to morbid obesity (BMI 44.30 kg/m2) Patient will need a sleep study as an outpatient for CPAP    Bilateral knee pain Likely osteoarthritis Follow-up with orthopedic surgery as an outpatient        Class 3 obesity based on BMI: Body mass index is 44.3 kg/m.  Underweight - under 18  overweight -  25 to 29 obese - 30 or more Class 1 obesity: BMI of 30.0 to 34 Class 2 obesity: BMI of 35.0 to 39 Class 3 obesity: BMI of 40.0 to 49 Super Morbid Obesity: BMI 50-59 Super-super Morbid Obesity: BMI 60+ Significantly low or high BMI is associated with higher medical risk.  Weight management advised as adjunct to other disease management and risk reduction treatments    DVT prophylaxis: lovenox  IV fluids: no continuous IV fluids  Nutrition: cardaic diet Central lines / invasive devices: none  Code Status: FULL CODE ACP documentation reviewed: none on file in [facility]  TOC needs: medication assistance  Barriers to dispo / significant pending items: O2 requirement, weaning down as able, anticipate ambulatory walk test again tomorrow and see how he does              Subjective / Brief ROS:  Patient reports still SOB some today  Denies CP/SOB at rest   Pain controlled.  Denies new weakness.  Tolerating diet.  Reports no concerns w/ urination/defecation.   Family Communication: none at this time     Objective Findings:  Vitals:   02/24/23 0104 02/24/23 0400 02/24/23 0713 02/24/23 0742  BP: (!) 142/94 (!) 143/81  (!) 138/104  Pulse: 85 80  88  Resp:  18  16  Temp: 98.1 F (36.7 C) 98.7 F (37.1 C)  98 F (36.7 C)  TempSrc:  Oral    SpO2: 95% 98% 99% 97%  Weight:  Height:        Intake/Output Summary (Last 24 hours) at 02/24/2023 1225 Last data filed at 02/24/2023 1042 Gross per 24 hour  Intake 960 ml  Output --  Net 960 ml   Filed Weights   02/21/23 0807  Weight: 136.1 kg    Examination:  Physical Exam Constitutional:      General: He is not in acute distress.    Appearance: He is well-developed.  Cardiovascular:     Rate and Rhythm: Normal rate and regular rhythm.  Pulmonary:     Effort: Tachypnea (examined just coming back from walk test and he reports SOB) present. No respiratory distress.     Breath sounds: Decreased breath sounds  present.  Musculoskeletal:     Right lower leg: No edema.     Left lower leg: No edema.  Skin:    General: Skin is warm and dry.  Neurological:     General: No focal deficit present.     Mental Status: He is alert.  Psychiatric:        Mood and Affect: Mood normal.        Behavior: Behavior normal.          Scheduled Medications:   amLODipine  5 mg Oral Daily   budesonide (PULMICORT) nebulizer solution  0.25 mg Nebulization BID   enoxaparin (LOVENOX) injection  60 mg Subcutaneous Q24H   guaiFENesin  1,200 mg Oral BID   ipratropium-albuterol  3 mL Nebulization Q6H WA   loratadine  10 mg Oral Daily   pantoprazole  40 mg Oral Daily    Continuous Infusions:   PRN Medications:  acetaminophen **OR** acetaminophen, alum & mag hydroxide-simeth, hydrALAZINE, ipratropium-albuterol, nicotine **OR** nicotine polacrilex, ondansetron **OR** ondansetron (ZOFRAN) IV, senna-docusate, traMADol  Antimicrobials from admission:  Anti-infectives (From admission, onward)    None           Data Reviewed:  I have personally reviewed the following...  CBC: Recent Labs  Lab 02/21/23 0822 02/22/23 0547  WBC 7.3 10.1  HGB 12.2* 12.4*  HCT 38.4* 37.4*  MCV 87.1 86.4  PLT 433* 466*   Basic Metabolic Panel: Recent Labs  Lab 02/21/23 0822 02/22/23 0547  NA 136 136  K 3.5 4.1  CL 103 101  CO2 24 25  GLUCOSE 102* 127*  BUN 12 11  CREATININE 1.01 0.99  CALCIUM 8.6* 8.8*   GFR: Estimated Creatinine Clearance: 134.6 mL/min (by C-G formula based on SCr of 0.99 mg/dL). Liver Function Tests: No results for input(s): "AST", "ALT", "ALKPHOS", "BILITOT", "PROT", "ALBUMIN" in the last 168 hours. No results for input(s): "LIPASE", "AMYLASE" in the last 168 hours. No results for input(s): "AMMONIA" in the last 168 hours. Coagulation Profile: No results for input(s): "INR", "PROTIME" in the last 168 hours. Cardiac Enzymes: No results for input(s): "CKTOTAL", "CKMB", "CKMBINDEX",  "TROPONINI" in the last 168 hours. BNP (last 3 results) No results for input(s): "PROBNP" in the last 8760 hours. HbA1C: No results for input(s): "HGBA1C" in the last 72 hours. CBG: Recent Labs  Lab 02/22/23 1136  GLUCAP 136*   Lipid Profile: No results for input(s): "CHOL", "HDL", "LDLCALC", "TRIG", "CHOLHDL", "LDLDIRECT" in the last 72 hours. Thyroid Function Tests: No results for input(s): "TSH", "T4TOTAL", "FREET4", "T3FREE", "THYROIDAB" in the last 72 hours. Anemia Panel: No results for input(s): "VITAMINB12", "FOLATE", "FERRITIN", "TIBC", "IRON", "RETICCTPCT" in the last 72 hours. Most Recent Urinalysis On File:  No results found for: "COLORURINE", "APPEARANCEUR", "LABSPEC", "PHURINE", "GLUCOSEU", "HGBUR", "BILIRUBINUR", "KETONESUR", "  PROTEINUR", "UROBILINOGEN", "NITRITE", "LEUKOCYTESUR" Sepsis Labs: @LABRCNTIP (procalcitonin:4,lacticidven:4) Microbiology: Recent Results (from the past 240 hours)  Resp panel by RT-PCR (RSV, Flu A&B, Covid) Anterior Nasal Swab     Status: None   Collection Time: 02/21/23  8:24 AM   Specimen: Anterior Nasal Swab  Result Value Ref Range Status   SARS Coronavirus 2 by RT PCR NEGATIVE NEGATIVE Final    Comment: (NOTE) SARS-CoV-2 target nucleic acids are NOT DETECTED.  The SARS-CoV-2 RNA is generally detectable in upper respiratory specimens during the acute phase of infection. The lowest concentration of SARS-CoV-2 viral copies this assay can detect is 138 copies/mL. A negative result does not preclude SARS-Cov-2 infection and should not be used as the sole basis for treatment or other patient management decisions. A negative result may occur with  improper specimen collection/handling, submission of specimen other than nasopharyngeal swab, presence of viral mutation(s) within the areas targeted by this assay, and inadequate number of viral copies(<138 copies/mL). A negative result must be combined with clinical observations, patient history,  and epidemiological information. The expected result is Negative.  Fact Sheet for Patients:  BloggerCourse.com  Fact Sheet for Healthcare Providers:  SeriousBroker.it  This test is no t yet approved or cleared by the [place] FDA and  has been authorized for detection and/or diagnosis of SARS-CoV-2 by FDA under an Emergency Use Authorization (EUA). This EUA will remain  in effect (meaning this test can be used) for the duration of the COVID-19 declaration under Section 564(b)(1) of the Act, 21 U.S.C.section 360bbb-3(b)(1), unless the authorization is terminated  or revoked sooner.       Influenza A by PCR NEGATIVE NEGATIVE Final   Influenza B by PCR NEGATIVE NEGATIVE Final    Comment: (NOTE) The Xpert Xpress SARS-CoV-2/FLU/RSV plus assay is intended as an aid in the diagnosis of influenza from Nasopharyngeal swab specimens and should not be used as a sole basis for treatment. Nasal washings and aspirates are unacceptable for Xpert Xpress SARS-CoV-2/FLU/RSV testing.  Fact Sheet for Patients: BloggerCourse.com  Fact Sheet for Healthcare Providers: SeriousBroker.it  This test is not yet approved or cleared by the [place] FDA and has been authorized for detection and/or diagnosis of SARS-CoV-2 by FDA under an Emergency Use Authorization (EUA). This EUA will remain in effect (meaning this test can be used) for the duration of the COVID-19 declaration under Section 564(b)(1) of the Act, 21 U.S.C. section 360bbb-3(b)(1), unless the authorization is terminated or revoked.     Resp Syncytial Virus by PCR NEGATIVE NEGATIVE Final    Comment: (NOTE) Fact Sheet for Patients: BloggerCourse.com  Fact Sheet for Healthcare Providers: SeriousBroker.it  This test is not yet approved or cleared by the [place] FDA and has  been authorized for detection and/or diagnosis of SARS-CoV-2 by FDA under an Emergency Use Authorization (EUA). This EUA will remain in effect (meaning this test can be used) for the duration of the COVID-19 declaration under Section 564(b)(1) of the Act, 21 U.S.C. section 360bbb-3(b)(1), unless the authorization is terminated or revoked.  Performed at [facility], [address], [place], [place] [address]       Radiology Studies last 3 days: [facility] Chest [facility] 1 View Result Date: 02/22/2023 CLINICAL DATA:  Shortness of breath. EXAM: PORTABLE CHEST 1 VIEW COMPARISON:  Radiograph and CT yesterday FINDINGS: The cardiomediastinal contours are stable. Minor bibasilar atelectasis. Pulmonary vasculature is normal. No confluent consolidation, pleural effusion, or pneumothorax. No acute osseous abnormalities are seen. IMPRESSION: Minor bibasilar atelectasis.  Electronically Signed   By: Narda Rutherford M.D.   On: 02/22/2023 18:19   DG Knee Complete 4 Views Right Result Date: 02/21/2023 CLINICAL DATA:  Knee pain.  Swelling. EXAM: RIGHT KNEE - COMPLETE 4+ VIEW COMPARISON:  05/08/2020. FINDINGS: No acute fracture or dislocation. No aggressive osseous lesion. There are degenerative changes of the knee joint in the form of reduced medial tibio-femoral compartment joint space and tricompartmental osteophytosis. No knee effusion or focal soft tissue swelling. No radiopaque foreign bodies. IMPRESSION: *No acute osseous abnormality of the right knee. Mild-to-moderate degenerative changes. Electronically Signed   By: Jules Schick M.D.   On: 02/21/2023 14:13   DG Knee Complete 4 Views Left Result Date: 02/21/2023 CLINICAL DATA:  Left knee pain.  Leg swelling. EXAM: LEFT KNEE - COMPLETE 4+ VIEW COMPARISON:  05/08/2020. FINDINGS: No acute fracture or dislocation. No aggressive osseous lesion. There are degenerative changes of the knee joint in the form of mildly reduced medial tibio-femoral compartment  joint space, tibial spiking and tricompartmental osteophytosis. No knee effusion or focal soft tissue swelling. No radiopaque foreign bodies. IMPRESSION: *No acute osseous abnormality of the left knee. 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 Electronically Signed   By: Jules Schick M.D.   On: 02/21/2023 14:12   US Venous Img Lower Bilateral (DVT) Result Date: 02/21/2023 CLINICAL DATA:  144481 Leg swelling 144481 Patient c/o being short of breath and bilateral leg swelling times three days. The swelling is more so around the knees EXAM: BILATERAL LOWER EXTREMITY VENOUS DOPPLER ULTRASOUND TECHNIQUE: Gray-scale sonography with graded compression, as well as color Doppler and duplex ultrasound were performed to evaluate the lower extremity deep venous systems from the level of the common femoral vein and including the common femoral, femoral, profunda femoral, popliteal and calf veins including the posterior tibial, peroneal and gastrocnemius veins when visible. The superficial great saphenous vein was also interrogated. Spectral Doppler was utilized to evaluate flow at rest and with distal augmentation maneuvers in the common femoral, femoral and popliteal veins. COMPARISON:  CTA PE, 02/21/2023. LEFT lower extremity venous duplex, 11/25/2019 FINDINGS: RIGHT LOWER EXTREMITY VENOUS Normal compressibility of the RIGHT common femoral, superficial femoral, and popliteal veins, as well as the visualized calf veins. Visualized portions of profunda femoral vein and great saphenous vein unremarkable. No filling defects to suggest DVT on grayscale or color Doppler imaging. Doppler waveforms show normal direction of venous flow, normal respiratory plasticity and response to augmentation. OTHER No evidence of superficial thrombophlebitis or abnormal fluid collection. Limitations: none LEFT LOWER EXTREMITY VENOUS Normal compressibility of the LEFT common  femoral, superficial femoral, and popliteal veins, as well as the visualized calf veins. Visualized portions of profunda femoral vein and great saphenous vein unremarkable. No filling defects to suggest DVT on grayscale or color Doppler imaging. Doppler waveforms show normal direction of venous flow, normal respiratory plasticity and response to augmentation. OTHER No evidence of superficial thrombophlebitis or abnormal fluid collection. Limitations: none IMPRESSION: No evidence of femoropopliteal DVT or superficial thrombophlebitis within either lower extremity. Roanna Banning, MD [facility] Radiology Electronically Signed   By: Roanna Banning M.D.   On: 02/21/2023 11:40   CT Angio Chest PE W/Cm &/Or Wo Cm Result Date: 02/21/2023 CLINICAL DATA:  Pulmonary embolism (PE) suspected, high prob. Shortness of breath. Bilateral leg swelling. EXAM: CT ANGIOGRAPHY CHEST WITH CONTRAST TECHNIQUE: Multidetector CT imaging of the chest was performed using the standard protocol during bolus administration of intravenous contrast. Multiplanar  CT image reconstructions and MIPs were obtained to evaluate the vascular anatomy. RADIATION DOSE REDUCTION: This exam was performed according to the departmental dose-optimization program which includes automated exposure control, adjustment of the mA and/or kV according to patient size and/or use of iterative reconstruction technique. CONTRAST:  75mL OMNIPAQUE IOHEXOL 350 MG/ML SOLN COMPARISON:  CT angiography chest from 01/03/2023. FINDINGS: Cardiovascular: No evidence of embolism to the proximal subsegmental pulmonary artery level. Normal cardiac size. No pericardial effusion. No aortic aneurysm. 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 Mediastinum/Nodes: Visualized thyroid gland appears grossly unremarkable. No solid /  cystic mediastinal masses. The esophagus is nondistended precluding optimal assessment. No axillary, mediastinal or hilar lymphadenopathy by size criteria. Lungs/Pleura: The central tracheo-bronchial tree is patent. There are dependent changes in the bilateral lungs. No mass or consolidation. No pleural effusion or pneumothorax. No suspicious lung nodules. Upper Abdomen: There is a 2.4 x 2.4 cm partially exophytic simple cyst in the left kidney upper pole. Remaining visualized upper abdominal viscera within normal limits. Musculoskeletal: The visualized soft tissues of the chest wall are grossly unremarkable. No suspicious osseous lesions. There are mild multilevel degenerative changes in the visualized spine. Review of the MIP images confirms the above findings. IMPRESSION: 1. No embolism to the proximal subsegmental pulmonary artery level. 2. Multiple other nonacute observations, as described above. Electronically Signed   By: Jules Schick M.D.   On: 02/21/2023 11:19   DG Chest 2 View Result Date: 02/21/2023 CLINICAL DATA:  Chest pain and shortness of breath EXAM: CHEST - 2 VIEW COMPARISON:  Chest radiograph 02/02/2013 FINDINGS: The heart size and mediastinal contours are within normal limits. Both lungs are clear. The visualized skeletal structures are unremarkable. IMPRESSION: No active cardiopulmonary disease. Electronically Signed   By: Annia Belt M.D.   On: 02/21/2023 08:43          Sunnie Nielsen, DO [facility] 02/24/2023, 12:25 PM    Dictation software may have been used to generate the above note. Typos may occur and escape review in typed/dictated notes. Please contact Dr Lyn Hollingshead directly for clarity if needed.  Staff may message me via secure chat in Epic  but this may not receive an immediate response,  please page me for urgent matters!  If 7PM-7AM, please contact night coverage [email or web address]

## 2023-02-24 NOTE — Discharge Instructions (Addendum)
 Your nurse navigator, Tiffanie, can be reached at [phone]  Please complete open door application for follow up :)   Some PCP options in [place] area- not a comprehensive list  [facility]- [phone] [facility]- [phone] [facility]- [phone] [facility]- [phone] [facility]- [phone] [facility]- [phone]  or [facility] Physician Referral Line [phone]   You are encouraged to call the open door clinic and arrange an application appointment to be screened for service to get set up with a physician for primary care   You may print the application and take with you to the appointment. At this web address [email or web address]  Please Call [facility] for appointment ( A hard copy of the [facility] New Patient application was printed to the unit as well to be given to patient 02/26/23).   [phone]  [address] [place], [place] [address]  Office Hours Monday: Closed Tuesday: 9:00am - 4:00pm Wednesday: 9:00am - 4:00pm Thursday: 9:00am - 8:00pm Friday: Closed  Endocrinology 2nd Thursday of the Month: 5:00pm - 8:00pm  Rheumatology/Orthopedic Clinic By appointment only.  Contact for availability.     Services Not Covered Obstetrics Gastrointestinal/Liver Disease

## 2023-02-24 NOTE — TOC Initial Note (Addendum)
Transition of Care [facility]) - Progression Note    Patient Details  Name: Justin Reid MRN: [id number] Date of Birth: 1980/01/11  [facility] CM/SW Contact  Garret Reddish, RN Phone Number: 02/24/2023, 10:21 AM  Clinical Narrative:    Chart reviewed.  Noted that patient was admitted with Asthma/COPD exacerbation.  Patient continues to be on bronchodilators. IV- Solu-medrol and inhaled steroids.  Patient continues to be on o2 at 2L per [place].  Will wean to determine if patient will require home 02.   I have spoken with patient.  He informs me that prior to admission he lived at home with his girlfriend.  He informed me that he was able to get around his home without any assistive devices.  He reports that he currently does not have medical insurance and currently does not work.  He reports that he will need assistance with medications on discharge.  He reports that he is in the process of finding a PCP but does not have a PCP at this time.    I have placed PCP information on the AVS.  Will assist patient with medications on discharge.  I will also provide patient with Open door application.    Patient remains on 02 at 2L per [place].  Will follow to see if patient will require home 02.  TOC will continue to follow for discharge planning.     Expected Discharge Plan: Home/Self Care Barriers to Discharge: Inadequate or no insurance (Needs medication assistance)  Expected Discharge Plan and Services   Discharge Planning Services: CM Consult, Medication Assistance, Indigent Health Clinic (PCP resources.)   Living arrangements for the past 2 months: Single Family Home                                       Social Determinants of Health (SDOH) Interventions SDOH Screenings   Food Insecurity: Food Insecurity Present (02/21/2023)  Housing: High Risk (02/21/2023)  Transportation Needs: No Transportation Needs (02/21/2023)  Utilities: At Risk (02/21/2023)  Tobacco Use: High  Risk (02/21/2023)    Readmission Risk Interventions     No data to display

## 2023-02-24 NOTE — Progress Notes (Signed)
Ambulatory O2 test done, pt was extremely SOB while walking, stopped the test, pt brought back in wheelchair to his room. O2 saturation was above 90%  on RA. MD made aware

## 2023-02-25 ENCOUNTER — Other Ambulatory Visit: Payer: Self-pay

## 2023-02-25 LAB — BASIC METABOLIC PANEL
Anion gap: 12 (ref 5–15)
BUN: 15 mg/dL (ref 6–20)
CO2: 25 mmol/L (ref 22–32)
Calcium: 8.6 mg/dL — ABNORMAL LOW (ref 8.9–10.3)
Chloride: 99 mmol/L (ref 98–111)
Creatinine, Ser: 1.09 mg/dL (ref 0.61–1.24)
GFR, Estimated: >60 mL/min (ref >60)
Glucose, Bld: 120 mg/dL — ABNORMAL HIGH (ref 70–99)
Potassium: 3.7 mmol/L (ref 3.5–5.1)
Sodium: 136 mmol/L (ref 135–145)

## 2023-02-25 LAB — CBC
HCT: 35.6 % — ABNORMAL LOW (ref 39.0–52.0)
Hemoglobin: 11.3 g/dL — ABNORMAL LOW (ref 13.0–17.0)
MCH: 27.4 pg (ref 26.0–34.0)
MCHC: 31.7 g/dL (ref 30.0–36.0)
MCV: 86.4 fL (ref 80.0–100.0)
Platelets: 427 10*3/uL — ABNORMAL HIGH (ref 150–400)
RBC: 4.12 MIL/uL — ABNORMAL LOW (ref 4.22–5.81)
RDW: 14 % (ref 11.5–15.5)
WBC: 11.2 10*3/uL — ABNORMAL HIGH (ref 4.0–10.5)
nRBC: 0 % (ref 0.0–0.2)

## 2023-02-25 MED ORDER — METHOCARBAMOL 500 MG PO TABS: 1000.0000 mg | ORAL_TABLET | Freq: Three times a day (TID) | ORAL | Status: DC | PRN

## 2023-02-25 MED ORDER — LORATADINE 10 MG PO TABS
10.0000 mg | ORAL_TABLET | Freq: Every day | ORAL | 0 refills | Status: DC
  Filled 2023-02-25: qty 30, 30d supply, fill #0

## 2023-02-25 MED ORDER — DICLOFENAC SODIUM 1 % EX GEL
4.0000 g | Freq: Four times a day (QID) | CUTANEOUS | Status: DC
  Administered 2023-02-25 – 2023-02-26 (×3): 4 g via TOPICAL
  Filled 2023-02-25: qty 100

## 2023-02-25 MED ORDER — PANTOPRAZOLE SODIUM 40 MG PO TBEC
40.0000 mg | DELAYED_RELEASE_TABLET | Freq: Every day | ORAL | 0 refills | Status: DC
Start: 2023-02-26 — End: 2023-04-06
  Filled 2023-02-25: qty 30, 30d supply, fill #0

## 2023-02-25 MED ORDER — FLUTICASONE-SALMETEROL 250-50 MCG/ACT IN AEPB
1.0000 | INHALATION_SPRAY | Freq: Two times a day (BID) | RESPIRATORY_TRACT | 0 refills | Status: DC
  Filled 2023-02-25: qty 60, 30d supply, fill #0
  Filled 2023-02-25: qty 13, fill #0

## 2023-02-25 MED ORDER — IPRATROPIUM-ALBUTEROL 0.5-2.5 (3) MG/3ML IN SOLN
3.0000 mL | RESPIRATORY_TRACT | 0 refills | Status: DC | PRN
  Filled 2023-02-25: qty 270, 8d supply, fill #0

## 2023-02-25 MED ORDER — AMLODIPINE BESYLATE 5 MG PO TABS
5.0000 mg | ORAL_TABLET | Freq: Every day | ORAL | 0 refills | Status: DC
  Filled 2023-02-25: qty 30, 30d supply, fill #0

## 2023-02-25 MED ORDER — DIPHENHYDRAMINE HCL 25 MG PO CAPS
25.0000 mg | ORAL_CAPSULE | Freq: Once | ORAL | Status: AC
  Administered 2023-02-25: 25 mg via ORAL
  Filled 2023-02-25: qty 1

## 2023-02-25 MED ORDER — MELATONIN 5 MG PO TABS
5.0000 mg | ORAL_TABLET | Freq: Once | ORAL | Status: AC
  Administered 2023-02-25: 5 mg via ORAL
  Filled 2023-02-25: qty 1

## 2023-02-25 MED ORDER — GUAIFENESIN ER 600 MG PO TB12
1200.0000 mg | ORAL_TABLET | Freq: Two times a day (BID) | ORAL | 0 refills | Status: DC | PRN
  Filled 2023-02-25: qty 36, 9d supply, fill #0

## 2023-02-25 MED ORDER — NICOTINE 14 MG/24HR TD PT24
14.0000 mg | MEDICATED_PATCH | Freq: Every day | TRANSDERMAL | 0 refills | Status: DC | PRN
  Filled 2023-02-25: qty 28, 28d supply, fill #0

## 2023-02-25 MED ORDER — ALBUTEROL SULFATE HFA 108 (90 BASE) MCG/ACT IN AERS
2.0000 | INHALATION_SPRAY | RESPIRATORY_TRACT | 0 refills | Status: DC | PRN
  Filled 2023-02-25: qty 6.7, 17d supply, fill #0

## 2023-02-25 MED ORDER — MONTELUKAST SODIUM 10 MG PO TABS
10.0000 mg | ORAL_TABLET | Freq: Every day | ORAL | 0 refills | Status: DC
  Filled 2023-02-25: qty 30, 30d supply, fill #0

## 2023-02-25 MED ORDER — MONTELUKAST SODIUM 10 MG PO TABS
10.0000 mg | ORAL_TABLET | Freq: Every day | ORAL | Status: DC
  Administered 2023-02-25: 10 mg via ORAL
  Filled 2023-02-25: qty 1

## 2023-02-25 NOTE — Plan of Care (Signed)

## 2023-02-25 NOTE — Progress Notes (Signed)
Patient requested RN to room. Entered room, patient requested sandwich tray. Provided. Concerns and questions regarding roommate addressed with advice per Charge Nurse. No further needs identified.

## 2023-02-25 NOTE — Progress Notes (Signed)
Awaiting response from [facility] Director [employer] regarding availability of funds.

## 2023-02-25 NOTE — Progress Notes (Signed)
PROGRESS NOTE    Justin Reid   XBJ:[id number] DOB: Mar 05, 1980  DOA: 02/21/2023 Date of Service: 02/25/23 which is hospital day 3  PCP: Pcp, No    Hospital course / significant events:   HPI: [name] is a 43 year old male with history of morbid obesity, tobacco use, hypertension, who presents emergency department for chief concerns of shortness of breath for 3 days along with swelling of his lower extremities and worsening shortness of breath when laying flat.  02/17: to ED. CTA chest no PE, BL LE [place] no DVT, Tx Asthma/COPD exacerbation. Acute hypoxic resp fail  02/18-02/19: remains on supplemental O2 02/20: walk test today, per RN he was severe SOB on exertion and had to be placed in wheelchair brought back to his room, but SpO2 was not low on RA.  02/21: still substantial SOB on exertion      Consultants:  none  Procedures/Surgeries: none      ASSESSMENT & PLAN:   Asthma exacerbation Shortness of breath  Acute hypoxis respiratory failure  Presumed secondary to continued current tobacco use Ruled out PE on admission Continue scheduled and as needed bronchodilator therapy DuoNebs Continue budesonide nebs, holding home Dulera for now  Steroids to po  Patient remains on oxygen supplementation at 2 L w/ severe SOB on minimal exertion  Maintain SpO2 greater than 92% Wean off oxygen as tolerated Repeat walk test tomorrow to assess endurance/O2 need, PT eval suspect some component of deconditioning     Tobacco abuse Continue nicotine transdermal patch    HTN (hypertension) Continue amlodipine 5 mg daily    OSA (obstructive sleep apnea) High clinical suspicion for obstructive sleep apnea secondary to morbid obesity (BMI 44.30 kg/m2) Patient will need a sleep study as an outpatient for CPAP    Bilateral knee pain Likely osteoarthritis Follow-up with orthopedic surgery as an outpatient        Class 3 obesity based on BMI: Body mass index is 44.3  kg/m.  Underweight - under 18  overweight - 25 to 29 obese - 30 or more Class 1 obesity: BMI of 30.0 to 34 Class 2 obesity: BMI of 35.0 to 39 Class 3 obesity: BMI of 40.0 to 49 Super Morbid Obesity: BMI 50-59 Super-super Morbid Obesity: BMI 60+ Significantly low or high BMI is associated with higher medical risk.  Weight management advised as adjunct to other disease management and risk reduction treatments    DVT prophylaxis: lovenox  IV fluids: no continuous IV fluids  Nutrition: cardaic diet Central lines / invasive devices: none  Code Status: FULL CODE ACP documentation reviewed: none on file in [facility]  TOC needs: medication assistance  Barriers to dispo / significant pending items: O2 requirement, weaning down as able, anticipate ambulatory walk test again tomorrow and see how he does              Subjective / Brief ROS:  Patient reports still SOB today reports significant SOB even w/ minimal movement,  Denies CP/SOB at rest   Pain controlled.  Denies new weakness.  Tolerating diet.  Reports no concerns w/ urination/defecation.   Family Communication: none at this time     Objective Findings:  Vitals:   02/24/23 2051 02/25/23 0558 02/25/23 0837 02/25/23 1150  BP: 133/77 (!) 154/86 (!) 150/94 (!) 126/96  Pulse: 98 89 79 80  Resp: 18 17 18 20   Temp: (!) 97.4 F (36.3 C) 97.6 F (36.4 C) 98 F (36.7 C) 98 F (36.7 C)  TempSrc:  Oral Axillary   SpO2: 93% 94% 97% 92%  Weight:      Height:       No intake or output data in the 24 hours ending 02/25/23 1255  Filed Weights   02/21/23 0807  Weight: 136.1 kg    Examination:  Physical Exam Constitutional:      General: He is not in acute distress.    Appearance: He is well-developed.  Cardiovascular:     Rate and Rhythm: Normal rate and regular rhythm.  Pulmonary:     Effort: No tachypnea (examined just coming back from walk test and he reports SOB) or respiratory distress.     Breath sounds:  Decreased breath sounds and wheezing present.  Musculoskeletal:     Right lower leg: No edema.     Left lower leg: No edema.  Skin:    General: Skin is warm and dry.  Neurological:     General: No focal deficit present.     Mental Status: He is alert.  Psychiatric:        Mood and Affect: Mood normal.        Behavior: Behavior normal.          Scheduled Medications:   amLODipine  5 mg Oral Daily   budesonide (PULMICORT) nebulizer solution  0.25 mg Nebulization BID   enoxaparin (LOVENOX) injection  60 mg Subcutaneous Q24H   guaiFENesin  1,200 mg Oral BID   ipratropium-albuterol  3 mL Nebulization Q6H WA   loratadine  10 mg Oral Daily   pantoprazole  40 mg Oral Daily   predniSONE  50 mg Oral Q breakfast    Continuous Infusions:   PRN Medications:  acetaminophen **OR** acetaminophen, alum & mag hydroxide-simeth, hydrALAZINE, ipratropium-albuterol, nicotine **OR** nicotine polacrilex, ondansetron **OR** ondansetron (ZOFRAN) IV, senna-docusate, traMADol  Antimicrobials from admission:  Anti-infectives (From admission, onward)    None           Data Reviewed:  I have personally reviewed the following...  CBC: Recent Labs  Lab 02/21/23 0822 02/22/23 0547 02/25/23 0503  WBC 7.3 10.1 11.2*  HGB 12.2* 12.4* 11.3*  HCT 38.4* 37.4* 35.6*  MCV 87.1 86.4 86.4  PLT 433* 466* 427*   Basic Metabolic Panel: Recent Labs  Lab 02/21/23 0822 02/22/23 0547 02/25/23 0503  NA 136 136 136  K 3.5 4.1 3.7  CL 103 101 99  CO2 24 25 25   GLUCOSE 102* 127* 120*  BUN 12 11 15   CREATININE 1.01 0.99 1.09  CALCIUM 8.6* 8.8* 8.6*   GFR: Estimated Creatinine Clearance: 122.2 mL/min (by C-G formula based on SCr of 1.09 mg/dL). Liver Function Tests: No results for input(s): "AST", "ALT", "ALKPHOS", "BILITOT", "PROT", "ALBUMIN" in the last 168 hours. No results for input(s): "LIPASE", "AMYLASE" in the last 168 hours. No results for input(s): "AMMONIA" in the last 168  hours. Coagulation Profile: No results for input(s): "INR", "PROTIME" in the last 168 hours. Cardiac Enzymes: No results for input(s): "CKTOTAL", "CKMB", "CKMBINDEX", "TROPONINI" in the last 168 hours. BNP (last 3 results) No results for input(s): "PROBNP" in the last 8760 hours. HbA1C: No results for input(s): "HGBA1C" in the last 72 hours. CBG: Recent Labs  Lab 02/22/23 1136  GLUCAP 136*   Lipid Profile: No results for input(s): "CHOL", "HDL", "LDLCALC", "TRIG", "CHOLHDL", "LDLDIRECT" in the last 72 hours. Thyroid Function Tests: No results for input(s): "TSH", "T4TOTAL", "FREET4", "T3FREE", "THYROIDAB" in the last 72 hours. Anemia Panel: No results for input(s): "VITAMINB12", "FOLATE", "  FERRITIN", "TIBC", "IRON", "RETICCTPCT" in the last 72 hours. Most Recent Urinalysis On File:  No results found for: "COLORURINE", "APPEARANCEUR", "LABSPEC", "PHURINE", "GLUCOSEU", "HGBUR", "BILIRUBINUR", "KETONESUR", "PROTEINUR", "UROBILINOGEN", "NITRITE", "LEUKOCYTESUR" Sepsis Labs: @LABRCNTIP (procalcitonin:4,lacticidven:4) Microbiology: Recent Results (from the past 240 hours)  Resp panel by RT-PCR (RSV, Flu A&B, Covid) Anterior Nasal Swab     Status: None   Collection Time: 02/21/23  8:24 AM   Specimen: Anterior Nasal Swab  Result Value Ref Range Status   SARS Coronavirus 2 by RT PCR NEGATIVE NEGATIVE Final    Comment: (NOTE) SARS-CoV-2 target nucleic acids are NOT DETECTED.  The SARS-CoV-2 RNA is generally detectable in upper respiratory specimens during the acute phase of infection. The lowest concentration of SARS-CoV-2 viral copies this assay can detect is 138 copies/mL. A negative result does not preclude SARS-Cov-2 infection and should not be used as the sole basis for treatment or other patient management decisions. A negative result may occur with  improper specimen collection/handling, submission of specimen other than nasopharyngeal swab, presence of viral mutation(s) within  the areas targeted by this assay, and inadequate number of viral copies(<138 copies/mL). A negative result must be combined with clinical observations, patient history, and epidemiological information. The expected result is Negative.  Fact Sheet for Patients:  BloggerCourse.com  Fact Sheet for Healthcare Providers:  SeriousBroker.it  This test is no t yet approved or cleared by the [place] FDA and  has been authorized for detection and/or diagnosis of SARS-CoV-2 by FDA under an Emergency Use Authorization (EUA). This EUA will remain  in effect (meaning this test can be used) for the duration of the COVID-19 declaration under Section 564(b)(1) of the Act, 21 U.S.C.section 360bbb-3(b)(1), unless the authorization is terminated  or revoked sooner.       Influenza A by PCR NEGATIVE NEGATIVE Final   Influenza B by PCR NEGATIVE NEGATIVE Final    Comment: (NOTE) The Xpert Xpress SARS-CoV-2/FLU/RSV plus assay is intended as an aid in the diagnosis of influenza from Nasopharyngeal swab specimens and should not be used as a sole basis for treatment. Nasal washings and aspirates are unacceptable for Xpert Xpress SARS-CoV-2/FLU/RSV testing.  Fact Sheet for Patients: BloggerCourse.com  Fact Sheet for Healthcare Providers: SeriousBroker.it  This test is not yet approved or cleared by the [place] FDA and has been authorized for detection and/or diagnosis of SARS-CoV-2 by FDA under an Emergency Use Authorization (EUA). This EUA will remain in effect (meaning this test can be used) for the duration of the COVID-19 declaration under Section 564(b)(1) of the Act, 21 U.S.C. section 360bbb-3(b)(1), unless the authorization is terminated or revoked.     Resp Syncytial Virus by PCR NEGATIVE NEGATIVE Final    Comment: (NOTE) Fact Sheet for  Patients: BloggerCourse.com  Fact Sheet for Healthcare Providers: SeriousBroker.it  This test is not yet approved or cleared by the [place] FDA and has been authorized for detection and/or diagnosis of SARS-CoV-2 by FDA under an Emergency Use Authorization (EUA). This EUA will remain in effect (meaning this test can be used) for the duration of the COVID-19 declaration under Section 564(b)(1) of the Act, 21 U.S.C. section 360bbb-3(b)(1), unless the authorization is terminated or revoked.  Performed at [facility], [address], [place], [place] [address]       Radiology Studies last 3 days: [facility] Chest [facility] 1 View Result Date: 02/22/2023 CLINICAL DATA:  Shortness of breath. EXAM: PORTABLE CHEST 1 VIEW COMPARISON:  Radiograph and CT yesterday FINDINGS: The cardiomediastinal  contours are stable. Minor bibasilar atelectasis. Pulmonary vasculature is normal. No confluent consolidation, pleural effusion, or pneumothorax. No acute osseous abnormalities are seen. IMPRESSION: Minor bibasilar atelectasis. Electronically Signed   By: Narda Rutherford M.D.   On: 02/22/2023 18:19          Sunnie Nielsen, DO [facility] 02/25/2023, 12:55 PM    Dictation software may have been used to generate the above note. Typos may occur and escape review in typed/dictated notes. Please contact Dr Lyn Hollingshead directly for clarity if needed.  Staff may message me via secure chat in Epic  but this may not receive an immediate response,  please page me for urgent matters!  If 7PM-7AM, please contact night coverage [email or web address]

## 2023-02-25 NOTE — TOC Progression Note (Signed)
Transition of Care [facility]) - Progression Note    Patient Details  Name: Justin Reid MRN: [id number] Date of Birth: [birth year]  Transition [facility]) CM/SW Contact  Garret Reddish, RN Phone Number: 02/25/2023, 3:35 PM  Clinical Narrative:    Informed that patient will need home nebulizer machine on discharge.  I have asked Jon with [employer] to provide home nebulizer machine.    Patient will be reevaluated for home 02 on tomorrow to see if patient will qualify for home 02.    TOC will continue to follow for discharge planning.    Barriers to Discharge: Inadequate or no insurance (Needs medication assistance)  Expected Discharge Plan and Services   Discharge Planning Services: CM Consult, Medication Assistance, Indigent Health Clinic (PCP resources.)   Living arrangements for the past 2 months: Single Family Home                                       Social Determinants of Health (SDOH) Interventions SDOH Screenings   Food Insecurity: Food Insecurity Present (02/21/2023)  Housing: High Risk (02/21/2023)  Transportation Needs: No Transportation Needs (02/21/2023)  Utilities: At Risk (02/21/2023)  Tobacco Use: High Risk (02/21/2023)    Readmission Risk Interventions     No data to display

## 2023-02-25 NOTE — Progress Notes (Addendum)
Patient walking on unit with significant other at bedside. Denies shortness of breath.

## 2023-02-25 NOTE — Plan of Care (Signed)

## 2023-02-26 NOTE — TOC Progression Note (Addendum)
 Transition of Care [facility]) - Progression Note    Patient Details  Name: Justin Reid MRN: [id number] Date of Birth: May 16, 1980  Transition of Care [facility]) CM/SW Contact  Bing Quarry, RN Phone Number: 02/26/2023, 10:26 AM  Clinical Narrative:  02/26/23. [facility] information added to AVS earlier, but a hard copy was also printed to the unit to be given to patient by Unit RN with notification that closed on Mondays and Fridays. Potential discharge today.    Gabriel Cirri MSN RN CM  RN Case Manager [place]  Transitions of Care Direct Dial: [phone] (Weekends Only) [facility] Main Office Phone: [phone] [facility] Fax: [phone] [email or web address]     Expected Discharge Plan: Home/Self Care Barriers to Discharge: Inadequate or no insurance (Needs medication assistance)  Expected Discharge Plan and Services   Discharge Planning Services: CM Consult, Medication Assistance, Indigent Health Clinic (PCP resources.)   Living arrangements for the past 2 months: Single Family Home Expected Discharge Date: 02/26/23                                     Social Determinants of Health (SDOH) Interventions SDOH Screenings   Food Insecurity: Food Insecurity Present (02/21/2023)  Housing: High Risk (02/21/2023)  Transportation Needs: No Transportation Needs (02/21/2023)  Utilities: At Risk (02/21/2023)  Tobacco Use: High Risk (02/21/2023)    Readmission Risk Interventions     No data to display

## 2023-02-26 NOTE — Progress Notes (Signed)
 Patient resting in bed with eyes closed. CPAP on. Significant other at bedside. No needs identified.

## 2023-02-26 NOTE — Plan of Care (Signed)

## 2023-02-26 NOTE — Progress Notes (Signed)
 Patient ambulating in hallway with significant other. Reports feeling "funny." Back to room. Labored breathing. Vital signs assessed. Stable. Encouraged to rest in bed.

## 2023-02-26 NOTE — Progress Notes (Signed)
 Sent provider the following message:  Patient requesting medication to assist with sleep. States he takes 25mg  of Benadryl at home every night. Per review of MAR, patient was given a one time dose of melatonin last night. Justin Reid is a 43 year old male with history of morbid obesity, tobacco use, hypertension, who presents emergency department for chief concerns of shortness of breath for 3 days along with swelling of his lower extremities and worsening shortness of breath when laying flat.   Received one time order for benadryl

## 2023-02-26 NOTE — Progress Notes (Signed)
 SATURATION QUALIFICATIONS: (This note is used to comply with regulatory documentation for home oxygen)  Patient Saturations on Room Air at Rest = 99%  Patient Saturations on Room Air while Ambulating = 90%   Please briefly explain why patient needs home oxygen: No oxygen needs at this time.  Patient verbalized that he did not want oxygen.

## 2023-02-26 NOTE — Discharge Summary (Signed)
 Physician Discharge Summary   Patient: Justin Reid MRN: [id number]  DOB: Jan 01, 1981   Admit:     Date of Admission: 02/21/2023 Admitted from: home   Discharge: Date of discharge: 02/26/23 Disposition: Home Condition at discharge: good  CODE STATUS: FULL CODE     Discharge Physician: Sunnie Nielsen, DO [facility]     PCP: Pcp, No  Recommendations for Outpatient Follow-up:  Follow up with PCP asap preferably 1-2 weeks    Discharge Instructions     Ambulatory Referral to Primary Care (Establish Care)   Complete by: As directed    Diet - low sodium heart healthy   Complete by: As directed    Increase activity slowly   Complete by: As directed          Discharge Diagnoses: Principal Problem:   Asthma exacerbation Active Problems:   Asthma   Tobacco abuse   OSA (obstructive sleep apnea)   HTN (hypertension)   At risk for sleep apnea   Obesity hypoventilation syndrome (HCC)   Obesity, Class III, BMI 40-49.9 (morbid obesity) (HCC)   Tobacco use        Hospital course / significant events:   HPI: Justin Reid is a 43 year old male with history of morbid obesity, tobacco use, hypertension, who presents emergency department for chief concerns of shortness of breath for 3 days along with swelling of his lower extremities and worsening shortness of breath when laying flat.  02/17: to ED. CTA chest no PE, BL LE [place] no DVT, Tx Asthma/COPD exacerbation. Acute hypoxic resp fail  02/18-02/19: remains on supplemental O2 02/20: walk test today, per RN he was severe SOB on exertion and had to be placed in wheelchair brought back to his room, but SpO2 was not low on RA.  02/21: still substantial SOB on exertion  02/22: room air at rest, has resources for outpatient follow up, ambulating well and no further concerns at this time      Consultants:  none  Procedures/Surgeries: none      ASSESSMENT & PLAN:   Asthma  exacerbation Shortness of breath  Acute hypoxis respiratory failure  Presumed secondary to continued current tobacco use Ruled out PE on admission Continue needed bronchodilator therapy DuoNebs or albuterol  Restart LABA-ICS on discharge  Steroids to po  Started Singulair     Tobacco abuse Continue nicotine transdermal patch Encouraged abstinence     HTN (hypertension) Continue amlodipine 5 mg daily    OSA (obstructive sleep apnea) High clinical suspicion for obstructive sleep apnea secondary to morbid obesity (BMI 44.30 kg/m2) Patient will need a sleep study as an outpatient for CPAP    Bilateral knee pain Likely osteoarthritis Follow-up with orthopedic surgery as an outpatient        Class 3 obesity based on BMI: Body mass index is 44.3 kg/m.  Underweight - under 18  overweight - 25 to 29 obese - 30 or more Class 1 obesity: BMI of 30.0 to 34 Class 2 obesity: BMI of 35.0 to 39 Class 3 obesity: BMI of 40.0 to 49 Super Morbid Obesity: BMI 50-59 Super-super Morbid Obesity: BMI 60+ Significantly low or high BMI is associated with higher medical risk.  Weight management advised as adjunct to other disease management and risk reduction treatments              Discharge Instructions  Allergies as of 02/26/2023   No Known Allergies      Medication List  STOP taking these medications    ondansetron 8 MG disintegrating tablet Commonly known as: ZOFRAN-ODT       TAKE these medications    albuterol 108 (90 Base) MCG/ACT inhaler Commonly known as: VENTOLIN HFA Inhale 2 puffs into the lungs every 4 (four) hours as needed for wheezing or shortness of breath.   amLODipine 5 MG tablet Commonly known as: NORVASC Take 1 tablet (5 mg total) by mouth daily.   FT Mucus Relief 12HR 600 MG 12 hr tablet Generic drug: guaiFENesin Take 2 tablets (1,200 mg total) by mouth 2 (two) times daily as needed for cough or to loosen phlegm.   ipratropium-albuterol  0.5-2.5 (3) MG/3ML Soln Commonly known as: DUONEB Take 3 mLs by nebulization every 2 (two) hours as needed (wheezing, shortness of breath). What changed:  when to take this reasons to take this   loratadine 10 MG tablet Commonly known as: CLARITIN Take 1 tablet (10 mg total) by mouth daily.   montelukast 10 MG tablet Commonly known as: SINGULAIR Take 1 tablet (10 mg total) by mouth daily.   nicotine 14 mg/24hr patch Commonly known as: NICODERM CQ - dosed in mg/24 hours Place 1 patch (14 mg total) onto the skin daily as needed (nicotine craving).   pantoprazole 40 MG tablet Commonly known as: PROTONIX Take 1 tablet (40 mg total) by mouth daily.   Wixela Inhub 250-50 MCG/ACT Aepb Generic drug: fluticasone-salmeterol Inhale 1 puff into the lungs in the morning and at bedtime.               Durable Medical Equipment  (From admission, onward)           Start     Ordered   02/25/23 1401  For home use only DME Nebulizer machine  Once       Question Answer Comment  Patient needs a nebulizer to treat with the following condition Asthma   Length of Need Lifetime   Additional equipment included Administration kit   Additional equipment included Filter      02/25/23 1400              No Known Allergies   Subjective: pt reports still some knee pain but walking okay, breathing is much better, no further concerns at this time.    Discharge Exam: BP (!) 147/95 (BP Location: Right Arm)   Pulse 79   Temp 98 F (36.7 C)   Resp 18   Ht 5\' 9"  (1.753 m)   Wt 136.1 kg   SpO2 97%   BMI 44.30 kg/m  General: Pt is alert, awake, not in acute distress Cardiovascular: RRR, S1/S2 +, no rubs, no gallops Respiratory: CTA bilaterally, no wheezing, no rhonchi Abdominal: Soft, NT, ND, bowel sounds + Extremities: no edema, no cyanosis     The results of significant diagnostics from this hospitalization (including imaging, microbiology, ancillary and laboratory) are  listed below for reference.     Microbiology: Recent Results (from the past 240 hours)  Resp panel by RT-PCR (RSV, Flu A&B, Covid) Anterior Nasal Swab     Status: None   Collection Time: 02/21/23  8:24 AM   Specimen: Anterior Nasal Swab  Result Value Ref Range Status   SARS Coronavirus 2 by RT PCR NEGATIVE NEGATIVE Final    Comment: (NOTE) SARS-CoV-2 target nucleic acids are NOT DETECTED.  The SARS-CoV-2 RNA is generally detectable in upper respiratory specimens during the acute phase of infection. The lowest concentration of SARS-CoV-2 viral copies this  assay can detect is 138 copies/mL. A negative result does not preclude SARS-Cov-2 infection and should not be used as the sole basis for treatment or other patient management decisions. A negative result may occur with  improper specimen collection/handling, submission of specimen other than nasopharyngeal swab, presence of viral mutation(s) within the areas targeted by this assay, and inadequate number of viral copies(<138 copies/mL). A negative result must be combined with clinical observations, patient history, and epidemiological information. The expected result is Negative.  Fact Sheet for Patients:  BloggerCourse.com  Fact Sheet for Healthcare Providers:  SeriousBroker.it  This test is no t yet approved or cleared by the [place] FDA and  has been authorized for detection and/or diagnosis of SARS-CoV-2 by FDA under an Emergency Use Authorization (EUA). This EUA will remain  in effect (meaning this test can be used) for the duration of the COVID-19 declaration under Section 564(b)(1) of the Act, 21 U.S.C.section 360bbb-3(b)(1), unless the authorization is terminated  or revoked sooner.       Influenza A by PCR NEGATIVE NEGATIVE Final   Influenza B by PCR NEGATIVE NEGATIVE Final    Comment: (NOTE) The Xpert Xpress SARS-CoV-2/FLU/RSV plus assay is intended as an  aid in the diagnosis of influenza from Nasopharyngeal swab specimens and should not be used as a sole basis for treatment. Nasal washings and aspirates are unacceptable for Xpert Xpress SARS-CoV-2/FLU/RSV testing.  Fact Sheet for Patients: BloggerCourse.com  Fact Sheet for Healthcare Providers: SeriousBroker.it  This test is not yet approved or cleared by the [place] FDA and has been authorized for detection and/or diagnosis of SARS-CoV-2 by FDA under an Emergency Use Authorization (EUA). This EUA will remain in effect (meaning this test can be used) for the duration of the COVID-19 declaration under Section 564(b)(1) of the Act, 21 U.S.C. section 360bbb-3(b)(1), unless the authorization is terminated or revoked.     Resp Syncytial Virus by PCR NEGATIVE NEGATIVE Final    Comment: (NOTE) Fact Sheet for Patients: BloggerCourse.com  Fact Sheet for Healthcare Providers: SeriousBroker.it  This test is not yet approved or cleared by the [place] FDA and has been authorized for detection and/or diagnosis of SARS-CoV-2 by FDA under an Emergency Use Authorization (EUA). This EUA will remain in effect (meaning this test can be used) for the duration of the COVID-19 declaration under Section 564(b)(1) of the Act, 21 U.S.C. section 360bbb-3(b)(1), unless the authorization is terminated or revoked.  Performed at [facility], [address] [place], [place] [address]      Labs: BNP (last 3 results) Recent Labs    01/20/23 1252 02/21/23 0822 02/22/23 1710  BNP 28.9 19.6 32.2   Basic Metabolic Panel: Recent Labs  Lab 02/21/23 0822 02/22/23 0547 02/25/23 0503  NA 136 136 136  K 3.5 4.1 3.7  CL 103 101 99  CO2 24 25 25   GLUCOSE 102* 127* 120*  BUN 12 11 15   CREATININE 1.01 0.99 1.09  CALCIUM 8.6* 8.8* 8.6*   Liver Function Tests: No results for  input(s): "AST", "ALT", "ALKPHOS", "BILITOT", "PROT", "ALBUMIN" in the last 168 hours. No results for input(s): "LIPASE", "AMYLASE" in the last 168 hours. No results for input(s): "AMMONIA" in the last 168 hours. CBC: Recent Labs  Lab 02/21/23 0822 02/22/23 0547 02/25/23 0503  WBC 7.3 10.1 11.2*  HGB 12.2* 12.4* 11.3*  HCT 38.4* 37.4* 35.6*  MCV 87.1 86.4 86.4  PLT 433* 466* 427*   Cardiac Enzymes: No results for input(s): "CKTOTAL", "  CKMB", "CKMBINDEX", "TROPONINI" in the last 168 hours. BNP: Invalid input(s): "POCBNP" CBG: Recent Labs  Lab 02/22/23 1136  GLUCAP 136*   D-Dimer No results for input(s): "DDIMER" in the last 72 hours. Hgb A1c No results for input(s): "HGBA1C" in the last 72 hours. Lipid Profile No results for input(s): "CHOL", "HDL", "LDLCALC", "TRIG", "CHOLHDL", "LDLDIRECT" in the last 72 hours. Thyroid function studies No results for input(s): "TSH", "T4TOTAL", "T3FREE", "THYROIDAB" in the last 72 hours.  Invalid input(s): "FREET3" Anemia work up No results for input(s): "VITAMINB12", "FOLATE", "FERRITIN", "TIBC", "IRON", "RETICCTPCT" in the last 72 hours. Urinalysis No results found for: "COLORURINE", "APPEARANCEUR", "LABSPEC", "PHURINE", "GLUCOSEU", "HGBUR", "BILIRUBINUR", "KETONESUR", "PROTEINUR", "UROBILINOGEN", "NITRITE", "LEUKOCYTESUR" Sepsis Labs Recent Labs  Lab 02/21/23 0822 02/22/23 0547 02/25/23 0503  WBC 7.3 10.1 11.2*   Microbiology Recent Results (from the past 240 hours)  Resp panel by RT-PCR (RSV, Flu A&B, Covid) Anterior Nasal Swab     Status: None   Collection Time: 02/21/23  8:24 AM   Specimen: Anterior Nasal Swab  Result Value Ref Range Status   SARS Coronavirus 2 by RT PCR NEGATIVE NEGATIVE Final    Comment: (NOTE) SARS-CoV-2 target nucleic acids are NOT DETECTED.  The SARS-CoV-2 RNA is generally detectable in upper respiratory specimens during the acute phase of infection. The lowest concentration of SARS-CoV-2 viral  copies this assay can detect is 138 copies/mL. A negative result does not preclude SARS-Cov-2 infection and should not be used as the sole basis for treatment or other patient management decisions. A negative result may occur with  improper specimen collection/handling, submission of specimen other than nasopharyngeal swab, presence of viral mutation(s) within the areas targeted by this assay, and inadequate number of viral copies(<138 copies/mL). A negative result must be combined with clinical observations, patient history, and epidemiological information. The expected result is Negative.  Fact Sheet for Patients:  BloggerCourse.com  Fact Sheet for Healthcare Providers:  SeriousBroker.it  This test is no t yet approved or cleared by the [place] FDA and  has been authorized for detection and/or diagnosis of SARS-CoV-2 by FDA under an Emergency Use Authorization (EUA). This EUA will remain  in effect (meaning this test can be used) for the duration of the COVID-19 declaration under Section 564(b)(1) of the Act, 21 U.S.C.section 360bbb-3(b)(1), unless the authorization is terminated  or revoked sooner.       Influenza A by PCR NEGATIVE NEGATIVE Final   Influenza B by PCR NEGATIVE NEGATIVE Final    Comment: (NOTE) The Xpert Xpress SARS-CoV-2/FLU/RSV plus assay is intended as an aid in the diagnosis of influenza from Nasopharyngeal swab specimens and should not be used as a sole basis for treatment. Nasal washings and aspirates are unacceptable for Xpert Xpress SARS-CoV-2/FLU/RSV testing.  Fact Sheet for Patients: BloggerCourse.com  Fact Sheet for Healthcare Providers: SeriousBroker.it  This test is not yet approved or cleared by the [place] FDA and has been authorized for detection and/or diagnosis of SARS-CoV-2 by FDA under an Emergency Use Authorization (EUA). This  EUA will remain in effect (meaning this test can be used) for the duration of the COVID-19 declaration under Section 564(b)(1) of the Act, 21 U.S.C. section 360bbb-3(b)(1), unless the authorization is terminated or revoked.     Resp Syncytial Virus by PCR NEGATIVE NEGATIVE Final    Comment: (NOTE) Fact Sheet for Patients: BloggerCourse.com  Fact Sheet for Healthcare Providers: SeriousBroker.it  This test is not yet approved or cleared by the [place] FDA and has  been authorized for detection and/or diagnosis of SARS-CoV-2 by FDA under an Emergency Use Authorization (EUA). This EUA will remain in effect (meaning this test can be used) for the duration of the COVID-19 declaration under Section 564(b)(1) of the Act, 21 U.S.C. section 360bbb-3(b)(1), unless the authorization is terminated or revoked.  Performed at [facility], [address], [place], [place] [address]    Imaging DG Chest [place] 1 View Result Date: 02/22/2023 CLINICAL DATA:  Shortness of breath. EXAM: PORTABLE CHEST 1 VIEW COMPARISON:  Radiograph and CT yesterday FINDINGS: The cardiomediastinal contours are stable. Minor bibasilar atelectasis. Pulmonary vasculature is normal. No confluent consolidation, pleural effusion, or pneumothorax. No acute osseous abnormalities are seen. IMPRESSION: Minor bibasilar atelectasis. Electronically Signed   By: Narda Rutherford M.D.   On: 02/22/2023 18:19   DG Knee Complete 4 Views Right Result Date: 02/21/2023 CLINICAL DATA:  Knee pain.  Swelling. EXAM: RIGHT KNEE - COMPLETE 4+ VIEW COMPARISON:  05/08/2020. FINDINGS: No acute fracture or dislocation. No aggressive osseous lesion. There are degenerative changes of the knee joint in the form of reduced medial tibio-femoral compartment joint space and tricompartmental osteophytosis. No knee effusion or focal soft tissue swelling. No radiopaque foreign bodies. IMPRESSION: *No  acute osseous abnormality of the right knee. Mild-to-moderate degenerative changes. Electronically Signed   By: Jules Schick M.D.   On: 02/21/2023 14:13   DG Knee Complete 4 Views Left Result Date: 02/21/2023 CLINICAL DATA:  Left knee pain.  Leg swelling. EXAM: LEFT KNEE - COMPLETE 4+ VIEW COMPARISON:  05/08/2020. FINDINGS: No acute fracture or dislocation. No aggressive osseous lesion. There are degenerative changes of the knee joint in the form of mildly reduced medial tibio-femoral compartment joint space, tibial spiking and tricompartmental osteophytosis. No knee effusion or focal soft tissue swelling. No radiopaque foreign bodies. IMPRESSION: *No acute osseous abnormality of the left knee. 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 Electronically Signed   By: Jules Schick M.D.   On: 02/21/2023 14:12   US Venous Img Lower Bilateral (DVT) Result Date: 02/21/2023 CLINICAL DATA:  144481 Leg swelling [id number] Patient c/o being short of breath and bilateral leg swelling times three days. The swelling is more so around the knees EXAM: BILATERAL LOWER EXTREMITY VENOUS DOPPLER ULTRASOUND TECHNIQUE: Gray-scale sonography with graded compression, as well as color Doppler and duplex ultrasound were performed to evaluate the lower extremity deep venous systems from the level of the common femoral vein and including the common femoral, femoral, profunda femoral, popliteal and calf veins including the posterior tibial, peroneal and gastrocnemius veins when visible. The superficial great saphenous vein was also interrogated. Spectral Doppler was utilized to evaluate flow at rest and with distal augmentation maneuvers in the common femoral, femoral and popliteal veins. COMPARISON:  CTA PE, 02/21/2023. LEFT lower extremity venous duplex, 11/25/2019 FINDINGS: RIGHT LOWER EXTREMITY VENOUS Normal compressibility of the RIGHT common femoral, superficial femoral,  and popliteal veins, as well as the visualized calf veins. Visualized portions of profunda femoral vein and great saphenous vein unremarkable. No filling defects to suggest DVT on grayscale or color Doppler imaging. Doppler waveforms show normal direction of venous flow, normal respiratory plasticity and response to augmentation. OTHER No evidence of superficial thrombophlebitis or abnormal fluid collection. Limitations: none LEFT LOWER EXTREMITY VENOUS Normal compressibility of the LEFT common femoral, superficial femoral, and popliteal veins, as well as the visualized calf veins. Visualized portions of profunda femoral vein and great saphenous vein unremarkable. No filling defects to suggest DVT  on grayscale or color Doppler imaging. Doppler waveforms show normal direction of venous flow, normal respiratory plasticity and response to augmentation. OTHER No evidence of superficial thrombophlebitis or abnormal fluid collection. Limitations: none IMPRESSION: No evidence of femoropopliteal DVT or superficial thrombophlebitis within either lower extremity. Roanna Banning, MD [facility] Electronically Signed   By: Roanna Banning M.D.   On: 02/21/2023 11:40   CT Angio Chest PE W/Cm &/Or Wo Cm Result Date: 02/21/2023 CLINICAL DATA:  Pulmonary embolism (PE) suspected, high prob. Shortness of breath. Bilateral leg swelling. EXAM: CT ANGIOGRAPHY CHEST WITH CONTRAST TECHNIQUE: Multidetector CT imaging of the chest was performed using the standard protocol during bolus administration of intravenous contrast. Multiplanar CT image reconstructions and MIPs were obtained to evaluate the vascular anatomy. RADIATION DOSE REDUCTION: This exam was performed according to the departmental dose-optimization program which includes automated exposure control, adjustment of the mA and/or kV according to patient size and/or use of iterative reconstruction technique. CONTRAST:  75mL  OMNIPAQUE IOHEXOL 350 MG/ML SOLN COMPARISON:  CT angiography chest from 01/03/2023. FINDINGS: Cardiovascular: No evidence of embolism to the proximal subsegmental pulmonary artery level. Normal cardiac size. No pericardial effusion. No aortic aneurysm. 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 Mediastinum/Nodes: Visualized thyroid gland appears grossly unremarkable. No solid / cystic mediastinal masses. The esophagus is nondistended precluding optimal assessment. No axillary, mediastinal or hilar lymphadenopathy by size criteria. Lungs/Pleura: The central tracheo-bronchial tree is patent. There are dependent changes in the bilateral lungs. No mass or consolidation. No pleural effusion or pneumothorax. No suspicious lung nodules. Upper Abdomen: There is a 2.4 x 2.4 cm partially exophytic simple cyst in the left kidney upper pole. Remaining visualized upper abdominal viscera within normal limits. Musculoskeletal: The visualized soft tissues of the chest wall are grossly unremarkable. No suspicious osseous lesions. There are mild multilevel degenerative changes in the visualized spine. Review of the MIP images confirms the above findings. IMPRESSION: 1. No embolism to the proximal subsegmental pulmonary artery level. 2. Multiple other nonacute observations, as described above. Electronically Signed   By: Jules Schick M.D.   On: 02/21/2023 11:19   DG Chest 2 View Result Date: 02/21/2023 CLINICAL DATA:  Chest pain and shortness of breath EXAM: CHEST - 2 VIEW COMPARISON:  Chest radiograph 02/02/2013 FINDINGS: The heart size and mediastinal contours are within normal limits. Both lungs are clear. The visualized skeletal structures are unremarkable. IMPRESSION: No active cardiopulmonary disease. Electronically Signed   By: Annia Belt M.D.   On: 02/21/2023 08:43      Time coordinating  discharge: over 30 minutes  SIGNED:  Sunnie Nielsen DO [facility]

## 2023-02-26 NOTE — Plan of Care (Signed)

## 2023-02-26 NOTE — Progress Notes (Signed)
 Patient requesting breathing treatment. Called Respiratory. Respiratory unavailable at this time. Entered patients room. Patient denies shortness of breath. States he requested medication to stay on schedule. Medication administered. See MAR.

## 2023-02-26 NOTE — Progress Notes (Signed)
 Patient awake resting in bed, watching phone. No needs identified

## 2023-03-01 ENCOUNTER — Telehealth: Payer: Self-pay

## 2023-03-02 ENCOUNTER — Telehealth: Payer: Self-pay

## 2023-03-07 ENCOUNTER — Other Ambulatory Visit: Payer: Self-pay

## 2023-03-07 ENCOUNTER — Emergency Department: Payer: Self-pay

## 2023-03-07 ENCOUNTER — Inpatient Hospital Stay
Admission: EM | Admit: 2023-03-07 | Discharge: 2023-03-10 | DRG: 202 | Disposition: A | Discharge status: Home / Self Care | Payer: MEDICAID | Attending: Student | Admitting: Student

## 2023-03-07 DIAGNOSIS — J209 Acute bronchitis, unspecified: Secondary | ICD-10-CM | POA: Diagnosis present

## 2023-03-07 DIAGNOSIS — E66813 Obesity, class 3: Secondary | ICD-10-CM | POA: Diagnosis present

## 2023-03-07 DIAGNOSIS — R0602 Shortness of breath: Secondary | ICD-10-CM | POA: Diagnosis not present

## 2023-03-07 DIAGNOSIS — Z8249 Family history of ischemic heart disease and other diseases of the circulatory system: Secondary | ICD-10-CM

## 2023-03-07 DIAGNOSIS — F17210 Nicotine dependence, cigarettes, uncomplicated: Secondary | ICD-10-CM | POA: Diagnosis present

## 2023-03-07 DIAGNOSIS — Z79899 Other long term (current) drug therapy: Secondary | ICD-10-CM

## 2023-03-07 DIAGNOSIS — F17200 Nicotine dependence, unspecified, uncomplicated: Secondary | ICD-10-CM | POA: Diagnosis present

## 2023-03-07 DIAGNOSIS — J45901 Unspecified asthma with (acute) exacerbation: Secondary | ICD-10-CM | POA: Diagnosis not present

## 2023-03-07 DIAGNOSIS — Z6841 Body Mass Index (BMI) 40.0 and over, adult: Secondary | ICD-10-CM

## 2023-03-07 DIAGNOSIS — Z9189 Other specified personal risk factors, not elsewhere classified: Secondary | ICD-10-CM

## 2023-03-07 DIAGNOSIS — Z1152 Encounter for screening for COVID-19: Secondary | ICD-10-CM

## 2023-03-07 DIAGNOSIS — Z5986 Financial insecurity: Secondary | ICD-10-CM

## 2023-03-07 DIAGNOSIS — E662 Morbid (severe) obesity with alveolar hypoventilation: Secondary | ICD-10-CM | POA: Diagnosis present

## 2023-03-07 DIAGNOSIS — R7303 Prediabetes: Secondary | ICD-10-CM | POA: Diagnosis present

## 2023-03-07 DIAGNOSIS — G4733 Obstructive sleep apnea (adult) (pediatric): Secondary | ICD-10-CM | POA: Diagnosis present

## 2023-03-07 DIAGNOSIS — J45909 Unspecified asthma, uncomplicated: Secondary | ICD-10-CM | POA: Diagnosis present

## 2023-03-07 DIAGNOSIS — E65 Localized adiposity: Secondary | ICD-10-CM | POA: Diagnosis present

## 2023-03-07 DIAGNOSIS — Z716 Tobacco abuse counseling: Secondary | ICD-10-CM

## 2023-03-07 DIAGNOSIS — I10 Essential (primary) hypertension: Secondary | ICD-10-CM | POA: Diagnosis present

## 2023-03-07 DIAGNOSIS — Z7951 Long term (current) use of inhaled steroids: Secondary | ICD-10-CM

## 2023-03-07 DIAGNOSIS — J4551 Severe persistent asthma with (acute) exacerbation: Principal | ICD-10-CM

## 2023-03-07 DIAGNOSIS — Z720 Tobacco use: Secondary | ICD-10-CM | POA: Diagnosis present

## 2023-03-07 DIAGNOSIS — Z5971 Insufficient health insurance coverage: Secondary | ICD-10-CM

## 2023-03-07 DIAGNOSIS — T380X5A Adverse effect of glucocorticoids and synthetic analogues, initial encounter: Secondary | ICD-10-CM | POA: Diagnosis not present

## 2023-03-07 DIAGNOSIS — M170 Bilateral primary osteoarthritis of knee: Secondary | ICD-10-CM | POA: Diagnosis present

## 2023-03-07 HISTORY — DX: Morbid (severe) obesity due to excess calories: E66.01

## 2023-03-07 HISTORY — DX: Tobacco use: Z72.0

## 2023-03-07 LAB — BASIC METABOLIC PANEL
Anion gap: 10 (ref 5–15)
BUN: 13 mg/dL (ref 6–20)
CO2: 25 mmol/L (ref 22–32)
Calcium: 9.5 mg/dL (ref 8.9–10.3)
Chloride: 102 mmol/L (ref 98–111)
Creatinine, Ser: 0.87 mg/dL (ref 0.61–1.24)
GFR, Estimated: >60 mL/min (ref >60)
Glucose, Bld: 109 mg/dL — ABNORMAL HIGH (ref 70–99)
Potassium: 4.1 mmol/L (ref 3.5–5.1)
Sodium: 137 mmol/L (ref 135–145)

## 2023-03-07 LAB — RESP PANEL BY RT-PCR (RSV, FLU A&B, COVID)  RVPGX2
Influenza A by PCR: NEGATIVE
Influenza B by PCR: NEGATIVE
Resp Syncytial Virus by PCR: NEGATIVE
SARS Coronavirus 2 by RT PCR: NEGATIVE

## 2023-03-07 LAB — CBC
HCT: 43 % (ref 39.0–52.0)
Hemoglobin: 13.7 g/dL (ref 13.0–17.0)
MCH: 27.5 pg (ref 26.0–34.0)
MCHC: 31.9 g/dL (ref 30.0–36.0)
MCV: 86.2 fL (ref 80.0–100.0)
Platelets: 398 10*3/uL (ref 150–400)
RBC: 4.99 MIL/uL (ref 4.22–5.81)
RDW: 13.7 % (ref 11.5–15.5)
WBC: 7.6 10*3/uL (ref 4.0–10.5)
nRBC: 0 % (ref 0.0–0.2)

## 2023-03-07 MED ORDER — MELATONIN 5 MG PO TABS
10.0000 mg | ORAL_TABLET | Freq: Once | ORAL | Status: AC
  Administered 2023-03-07: 10 mg via ORAL
  Filled 2023-03-07: qty 2

## 2023-03-07 MED ORDER — IPRATROPIUM-ALBUTEROL 0.5-2.5 (3) MG/3ML IN SOLN
3.0000 mL | Freq: Once | RESPIRATORY_TRACT | Status: AC
  Administered 2023-03-07: 3 mL via RESPIRATORY_TRACT
  Filled 2023-03-07: qty 3

## 2023-03-07 MED ORDER — ENOXAPARIN SODIUM 80 MG/0.8ML IJ SOSY
70.0000 mg | PREFILLED_SYRINGE | INTRAMUSCULAR | Status: DC
  Administered 2023-03-07 – 2023-03-09 (×3): 70 mg via SUBCUTANEOUS
  Filled 2023-03-07 (×3): qty 0.7

## 2023-03-07 MED ORDER — ENOXAPARIN SODIUM 40 MG/0.4ML IJ SOSY: 40.0000 mg | PREFILLED_SYRINGE | INTRAMUSCULAR | Status: DC

## 2023-03-07 MED ORDER — METHYLPREDNISOLONE SODIUM SUCC 125 MG IJ SOLR
125.0000 mg | Freq: Once | INTRAMUSCULAR | Status: AC
  Administered 2023-03-07: 125 mg via INTRAVENOUS
  Filled 2023-03-07: qty 2

## 2023-03-07 MED ORDER — IPRATROPIUM-ALBUTEROL 0.5-2.5 (3) MG/3ML IN SOLN
RESPIRATORY_TRACT | Status: AC
  Administered 2023-03-07: 3 mL
  Filled 2023-03-07: qty 6

## 2023-03-07 MED ORDER — SENNOSIDES-DOCUSATE SODIUM 8.6-50 MG PO TABS: 1.0000 | ORAL_TABLET | Freq: Every evening | ORAL | Status: DC | PRN

## 2023-03-07 MED ORDER — ACETAMINOPHEN 650 MG RE SUPP: 650.0000 mg | Freq: Four times a day (QID) | RECTAL | Status: DC | PRN

## 2023-03-07 MED ORDER — IPRATROPIUM-ALBUTEROL 0.5-2.5 (3) MG/3ML IN SOLN
3.0000 mL | Freq: Four times a day (QID) | RESPIRATORY_TRACT | Status: DC
  Administered 2023-03-07 – 2023-03-08 (×3): 3 mL via RESPIRATORY_TRACT
  Filled 2023-03-07 (×3): qty 3

## 2023-03-07 MED ORDER — ONDANSETRON HCL 4 MG PO TABS: 4.0000 mg | ORAL_TABLET | Freq: Four times a day (QID) | ORAL | Status: DC | PRN

## 2023-03-07 MED ORDER — ACETAMINOPHEN 325 MG PO TABS
650.0000 mg | ORAL_TABLET | Freq: Four times a day (QID) | ORAL | Status: DC | PRN
  Administered 2023-03-07: 650 mg via ORAL
  Filled 2023-03-07: qty 2

## 2023-03-07 MED ORDER — HYDRALAZINE HCL 20 MG/ML IJ SOLN: 5.0000 mg | Freq: Four times a day (QID) | INTRAMUSCULAR | Status: DC | PRN

## 2023-03-07 MED ORDER — ONDANSETRON HCL 4 MG/2ML IJ SOLN: 4.0000 mg | Freq: Four times a day (QID) | INTRAMUSCULAR | Status: DC | PRN

## 2023-03-07 MED ORDER — METHYLPREDNISOLONE SODIUM SUCC 40 MG IJ SOLR
40.0000 mg | Freq: Every day | INTRAMUSCULAR | Status: AC
  Administered 2023-03-08: 40 mg via INTRAVENOUS
  Filled 2023-03-07: qty 1

## 2023-03-07 NOTE — ED Triage Notes (Addendum)
 Pt comes with c/o increased leg swelling to his knees and sob. Pt was seen here few weeks back for same. Pt states it is not getting better but worse. Pt has audible wheezing and labored breathing noted. Pt denies any cough or fever.   Pt has hx of asthma.

## 2023-03-07 NOTE — Assessment & Plan Note (Signed)
 Hydralazine 5 mg IV every 6 hours as needed for SBP greater than 165, 5 days ordered

## 2023-03-07 NOTE — ED Provider Notes (Signed)
 [facility] Provider Note    Event Date/Time   First MD Initiated Contact with Patient 03/07/23 1026     (approximate)   History   Shortness of Breath   HPI  Justin Reid is a 43 y.o. male who presents to the emergency department today because of concerns for breathing difficulty.  The patient states that it started a couple of days ago.  Was in the hospital and discharged roughly 10 days ago after asthma exacerbation.  He says that this reminds him of that admission.  The patient denies any chest pain. No fevers.     Physical Exam   Triage Vital Signs: ED Triage Vitals  Encounter Vitals Group     BP 03/07/23 1020 (!) 145/107     Systolic BP Percentile --      Diastolic BP Percentile --      Pulse Rate 03/07/23 1020 93     Resp 03/07/23 1020 (!) 24     Temp 03/07/23 1020 98 F (36.7 C)     Temp src --      SpO2 03/07/23 1020 93 %     Weight 03/07/23 1012 300 lb (136.1 kg)     Height 03/07/23 1012 5\' 9"  (1.753 m)     Head Circumference --      Peak Flow --      Pain Score 03/07/23 1011 7     Pain Loc --      Pain Education --      Exclude from Growth Chart --     Most recent vital signs: Vitals:   03/07/23 1020  BP: (!) 145/107  Pulse: 93  Resp: (!) 24  Temp: 98 F (36.7 C)  SpO2: 93%   General: Awake, alert, oriented. CV:  Good peripheral perfusion. Regular rate and rhythm. Resp:  Increased work of breathing. Diffuse wheezing. Abd:  No distention.   ED Results / Procedures / Treatments   Labs (all labs ordered are listed, but only abnormal results are displayed) Labs Reviewed  BASIC METABOLIC PANEL - Abnormal; Notable for the following components:      Result Value   Glucose, Bld 109 (*)    All other components within normal limits  RESP PANEL BY RT-PCR (RSV, FLU A&B, COVID)  [id number]  CBC     EKG  I, Phineas Semen, attending physician, personally viewed and interpreted this EKG  EKG Time: 1004 Rate: 97 Rhythm:  normal sinus rhythm Axis: normal Intervals: qtc 449 QRS: narrow, q waves v1 ST changes: no st elevation Impression: abnormal ekg   RADIOLOGY I independently interpreted and visualized the CXR. My interpretation: No pneumonia. Radiology interpretation:  IMPRESSION:  No active disease.     PROCEDURES:  Critical Care performed: No   MEDICATIONS ORDERED IN ED: Medications - No data to display   IMPRESSION / MDM / ASSESSMENT AND PLAN / ED COURSE  I reviewed the triage vital signs and the nursing notes.                              Differential diagnosis includes, but is not limited to, asthma exacerbation, viral URI, pneumonia, anemia  Patient's presentation is most consistent with acute presentation with potential threat to life or bodily function.   The patient is on the cardiac monitor to evaluate for evidence of arrhythmia and/or significant heart rate changes.  Patient presented to the emergency department today because  of concerns for shortness of breath.  On exam patient has diffuse expiratory wheezing. Will give breathing treatments, will check blood work and CXR.  CXR without pneumonia, Blood work without concerning anemia or electrolyte abnormality. Patient without significant improvement after duoneb treatments. Continued to have poor air movement on auscultation. Discussed with Dr. Sedalia Muta with the hospitalist service who will evaluate for admission.    FINAL CLINICAL IMPRESSION(S) / ED DIAGNOSES   Final diagnoses:  Exacerbation of asthma, unspecified asthma severity, unspecified whether persistent     Note:  This document was prepared using Dragon voice recognition software and may include unintentional dictation errors.    Phineas Semen, MD 03/07/23 [id number]

## 2023-03-07 NOTE — Progress Notes (Signed)
 PHARMACIST - PHYSICIAN COMMUNICATION  CONCERNING:  Enoxaparin (Lovenox) for DVT Prophylaxis    RECOMMENDATION: Patient was prescribed enoxaprin 40mg  q24 hours for VTE prophylaxis.   Filed Weights   03/07/23 1012  Weight: 136.1 kg (300 lb)    Body mass index is 44.3 kg/m.  Estimated Creatinine Clearance: 153.1 mL/min (by C-G formula based on SCr of 0.87 mg/dL).   Based on [facility] policy patient is candidate for enoxaparin 0.5mg /kg TBW SQ every 24 hours based on BMI being >30.  DESCRIPTION: Pharmacy has adjusted enoxaparin dose per [facility] policy.  Patient is now receiving enoxaparin 0.5 mg/kg every 24 hours    Lowella Bandy, PharmD Clinical Pharmacist  03/07/2023 2:11 PM

## 2023-03-07 NOTE — Assessment & Plan Note (Addendum)
 This complicates overall care and prognosis.  Extent discussion at bedside regarding healthy weight loss including decreasing fried food.  He reports that he less fried chicken and that this is something that he eats regularly. Extensive discussion regarding for the next year, patient will refrain from eating any fried food including fried chicken.  Recommended baked chicken with brown rice and steamed broccoli.  Recommended baked fish including baked tilapia with herbs of choice and lemon juice and a small amount of chicken broth.  Patient endorses understanding and compliance.

## 2023-03-07 NOTE — Assessment & Plan Note (Signed)
 Patient with that from outpatient sleep study as he likely has sleep apnea and in setting of asthma exacerbation with multiple hospitalizations this year, patient would benefit greatly from a sleep study and CPAP treatment nightly at home

## 2023-03-07 NOTE — Assessment & Plan Note (Signed)
 CPAP nightly ordered

## 2023-03-07 NOTE — Hospital Course (Signed)
 [name] is a 43 year old male with history of asthma, hypertension, morbid obesity, continued tobacco use, frequent hospitalization for asthma exacerbation, who presents emergency department for chief concerns of shortness of breath.  Vitals in the ED showed temperature 98, respiration rate of 24, heart rate 93, blood pressure 145/107, SpO2 97% on room air.  Patient had SpO2 mild desaturation and was placed on 3 L nasal cannula with SpO2 of 90%.  Serum sodium is 137, potassium 4.1, chloride 102, bicarb 25, BUN of 13, serum creatinine of 0.87, EGFR greater than 60, nonfasting blood glucose 109, WBC 7.6, hemoglobin 13.7, platelets of 798.  COVID/influenza A/influenza B/RSV PCR were negative.  ED treatment: DuoNebs 3 treatments, Solu-Medrol 125 mg IV one-time dose.

## 2023-03-07 NOTE — H&P (Addendum)
 History and Physical   Justin Reid XBJ:[id number] DOB: 1980/06/24 DOA: 03/07/2023  PCP: Pcp, No  Patient coming from: Home  I have personally briefly reviewed patient's old medical records in [facility] EMR.  Chief Concern: Shortness of breath  HPI: [name] is a 43 year old male with history of asthma, hypertension, morbid obesity, continued tobacco use, frequent hospitalization for asthma exacerbation, who presents emergency department for chief concerns of shortness of breath.  Vitals in the ED showed temperature 98, respiration rate of 24, heart rate 93, blood pressure 145/107, SpO2 97% on room air.  Patient had SpO2 mild desaturation and was placed on 3 L nasal cannula with SpO2 of 90%.  Serum sodium is 137, potassium 4.1, chloride 102, bicarb 25, BUN of 13, serum creatinine of 0.87, EGFR greater than 60, nonfasting blood glucose 109, WBC 7.6, hemoglobin 13.7, platelets of 798.  COVID/influenza A/influenza B/RSV PCR were negative.  ED treatment: DuoNebs 3 treatments, Solu-Medrol 125 mg IV one-time dose. -------------------------------- At bedside, patient able to tell me his first and last name, age, location, current calendar year.  He reports he has been having worsening shortness of breath for the last 2 to 3 days.  He reports he has been using his rescue inhaler at home.  He reports he is having difficulty getting access to outpatient care due to financial difficulty and not having health insurance.  He denies trauma to his person.  He reports that since discharge from the hospital on 02/26/2023, he has smoked maybe 1-1/2 cigarettes.  He is actively trying to quit and feels that he will be successful at this time.  He denies EtOH and recreational drug use.  He reports his friend suggested vaping however he states he will not try that.  He is at baseline dry/nonproductive cough that remains unchanged.  He denies fever, chills, nausea, vomiting, chest pain, dysuria,  hematuria, blood in his stool, diarrhea.  He reports the swelling and pain in his bilateral legs have been chronic and remains unchanged from mid February presentation.  Social history: He lives at home with his girlfriend.  He is a former tobacco user, trying to quit over the last 2 weeks.  He denies EtOH and recreational drug use.  He is currently in between jobs and having difficulty with financial access.  ROS: Constitutional: no weight change, no fever ENT/Mouth: no sore throat, no rhinorrhea Eyes: no eye pain, no vision changes Cardiovascular: no chest pain, + dyspnea,  no edema, no palpitations Respiratory: + cough, no sputum, no wheezing Gastrointestinal: no nausea, no vomiting, no diarrhea, no constipation Genitourinary: no urinary incontinence, no dysuria, no hematuria Musculoskeletal: no arthralgias, no myalgias Skin: no skin lesions, no pruritus, Neuro: + weakness, no loss of consciousness, no syncope Psych: no anxiety, no depression, + decrease appetite Heme/Lymph: no bruising, no bleeding  ED Course: Discussed with EDP, patient requiring hospitalization for chief concerns of asthma exacerbation.  Assessment/Plan  Principal Problem:   Acute severe exacerbation of severe persistent asthma Active Problems:   Asthma   Obesity hypoventilation syndrome (HCC)   Tobacco abuse   At risk for sleep apnea   OSA (obstructive sleep apnea)   HTN (hypertension)   Truncal obesity   Obesity, Class III, BMI 40-49.9 (morbid obesity) (HCC)   Assessment and Plan:  * Acute severe exacerbation of severe persistent asthma Suspect multifactorial in setting of recent tobacco cessation, financial difficulty leading to lack of outpatient/maintenance care access, no outpatient sleep study, and morbid obesity pending  sleep study outpatient Ipratropium-albuterol 3 mL nebulizer 4 times daily, 4 doses ordered on admission Status post Solu-Medrol 125 mg IV one-time dose per EDP, I have ordered  Solu-Medrol 40 mg IV one-time dose for 03/08/23  Tobacco abuse Patient reports he has only smoked 1.5 cigarettes in the last 2 weeks. I congratulated him on smoking cessation goal  Obesity hypoventilation syndrome (HCC) Patient with that from outpatient sleep study as he likely has sleep apnea and in setting of asthma exacerbation with multiple hospitalizations this year, patient would benefit greatly from a sleep study and CPAP treatment nightly at home  OSA (obstructive sleep apnea) CPAP nightly ordered  HTN (hypertension) Hydralazine 5 mg IV every 6 hours as needed for SBP greater than 165, 5 days ordered  Obesity, Class III, BMI 40-49.9 (morbid obesity) (HCC) This complicates overall care and prognosis.  Extent discussion at bedside regarding healthy weight loss including decreasing fried food.  He reports that he less fried chicken and that this is something that he eats regularly. Extensive discussion regarding for the next year, patient will refrain from eating any fried food including fried chicken.  Recommended baked chicken with brown rice and steamed broccoli.  Recommended baked fish including baked tilapia with herbs of choice and lemon juice and a small amount of chicken broth.  Patient endorses understanding and compliance.  Chart reviewed.   DVT prophylaxis: Enoxaparin weight-based, subcutaneous every 24 hours Code Status: Full code Diet: Heart healthy diet Family Communication: No Disposition Plan: Pending clinical course Consults called: None at this time Admission status: Telemetry medical, inpatient  Past Medical History:  Diagnosis Date   2019-nCoV vaccination declined 11/10/2020   Arthritis    Asthma    Hypertension    Morbid obesity (HCC)    Shingles    Tobacco use    History reviewed. No pertinent surgical history.  Social History:  reports that he has been smoking cigarettes. He has a 2.5 pack-year smoking history. He has never used smokeless tobacco.  He reports current alcohol use. He reports that he does not use drugs.  No Known Allergies Family History  Problem Relation Age of Onset   Hypertension Father    Family history: Family history reviewed and not pertinent.  Prior to Admission medications   Medication Sig Start Date End Date Taking? Authorizing Provider  albuterol (VENTOLIN HFA) 108 (90 Base) MCG/ACT inhaler Inhale 2 puffs into the lungs every 4 (four) hours as needed for wheezing or shortness of breath. 02/25/23   Sunnie Nielsen, DO  amLODipine (NORVASC) 5 MG tablet Take 1 tablet (5 mg total) by mouth daily. 02/25/23 05/26/23  Sunnie Nielsen, DO  fluticasone-salmeterol [facility]) 250-50 MCG/ACT AEPB Inhale 1 puff into the lungs in the morning and at bedtime. 02/25/23   Sunnie Nielsen, DO  guaiFENesin (MUCINEX) 600 MG 12 hr tablet Take 2 tablets (1,200 mg total) by mouth 2 (two) times daily as needed for cough or to loosen phlegm. 02/25/23   Sunnie Nielsen, DO  ipratropium-albuterol (DUONEB) 0.5-2.5 (3) MG/3ML SOLN Take 3 mLs by nebulization every 2 (two) hours as needed (wheezing, shortness of breath). 02/25/23   Sunnie Nielsen, DO  loratadine (CLARITIN) 10 MG tablet Take 1 tablet (10 mg total) by mouth daily. 02/26/23   Sunnie Nielsen, DO  montelukast (SINGULAIR) 10 MG tablet Take 1 tablet (10 mg total) by mouth daily. 02/25/23   Sunnie Nielsen, DO  nicotine (NICODERM CQ - DOSED IN MG/24 HOURS) 14 mg/24hr patch Place 1 patch (14 mg  total) onto the skin daily as needed (nicotine craving). 02/25/23   Sunnie Nielsen, DO  pantoprazole (PROTONIX) 40 MG tablet Take 1 tablet (40 mg total) by mouth daily. 02/26/23   Sunnie Nielsen, DO   Physical Exam: Vitals:   03/07/23 1012 03/07/23 1020 03/07/23 1200  BP:  (!) 145/107 (!) 146/112  Pulse:  93 87  Resp:  (!) 24 18  Temp:  98 F (36.7 C)   SpO2:  93% 95%  Weight: 136.1 kg    Height: 5\' 9"  (1.753 m)     Constitutional: appears older than  chronological age, NAD, calm Eyes: PERRL, lids and conjunctivae normal ENMT: Mucous membranes are moist. Posterior pharynx clear of any exudate or lesions. Age-appropriate dentition. Hearing appropriate.  Neck circumference is large. Neck: normal, supple, no masses, no thyromegaly Respiratory: Limited air movement though appears to be improved from ED discussion,  no wheezing, no crackles. Normal respiratory effort. No accessory muscle use.  Cardiovascular: Regular rate and rhythm, no murmurs / rubs / gallops.  Bilateral lower extremity trace edema, nonpitting. 2+ pedal pulses. No carotid bruits.  Abdomen: Morbidly obese abdomen, no tenderness, no masses palpated, no hepatosplenomegaly. Bowel sounds positive.  Musculoskeletal: no clubbing / cyanosis. No joint deformity upper and lower extremities. Good ROM of bilateral upper extremities, no contractures, no atrophy. Normal muscle tone.  Bilateral knee pain with decreased range of motion Skin: no rashes, lesions, ulcers. No induration Neurologic: Sensation intact. Strength 5/5 in all 4.  Psychiatric: Normal judgment and insight. Alert and oriented x 3. Normal mood.   EKG: independently reviewed, showing sinus rhythm with rate of 97, QTc 449  Chest x-ray on Admission: I personally reviewed and I agree with radiologist reading as below.  DG Chest Port 1 View Result Date: 03/07/2023 CLINICAL DATA:  Shortness of breath and wheezing EXAM: PORTABLE CHEST 1 VIEW COMPARISON:  Chest radiograph 02/22/2023 FINDINGS: The heart size and mediastinal contours are within normal limits. Both lungs are clear. The visualized skeletal structures are unremarkable. IMPRESSION: No active disease. Electronically Signed   By: Annia Belt M.D.   On: 03/07/2023 11:33   Labs on Admission: I have personally reviewed following labs  CBC: Recent Labs  Lab 03/07/23 1000  WBC 7.6  HGB 13.7  HCT 43.0  MCV 86.2  PLT 398   Basic Metabolic Panel: Recent Labs  Lab  03/07/23 1000  NA 137  K 4.1  CL 102  CO2 25  GLUCOSE 109*  BUN 13  CREATININE 0.87  CALCIUM 9.5   GFR: Estimated Creatinine Clearance: 153.1 mL/min (by C-G formula based on SCr of 0.87 mg/dL).  This document was prepared using Dragon Voice Recognition software and may include unintentional dictation errors.  Dr. Sedalia Muta [facility]  If 7PM-7AM, please contact overnight-coverage provider If 7AM-7PM, please contact day attending provider [email or web address]  03/07/2023, 3:13 PM

## 2023-03-07 NOTE — Assessment & Plan Note (Signed)
 Patient reports he has only smoked 1.5 cigarettes in the last 2 weeks. I congratulated him on smoking cessation goal

## 2023-03-07 NOTE — Assessment & Plan Note (Addendum)
 Suspect multifactorial in setting of recent tobacco cessation, financial difficulty leading to lack of outpatient/maintenance care access, no outpatient sleep study, and morbid obesity pending sleep study outpatient Ipratropium-albuterol 3 mL nebulizer 4 times daily, 4 doses ordered on admission Status post Solu-Medrol 125 mg IV one-time dose per EDP, I have ordered Solu-Medrol 40 mg IV one-time dose for 03/08/23

## 2023-03-08 LAB — BASIC METABOLIC PANEL
Anion gap: 10 (ref 5–15)
BUN: 18 mg/dL (ref 6–20)
CO2: 24 mmol/L (ref 22–32)
Calcium: 9.1 mg/dL (ref 8.9–10.3)
Chloride: 102 mmol/L (ref 98–111)
Creatinine, Ser: 1.11 mg/dL (ref 0.61–1.24)
GFR, Estimated: >60 mL/min (ref >60)
Glucose, Bld: 159 mg/dL — ABNORMAL HIGH (ref 70–99)
Potassium: 3.8 mmol/L (ref 3.5–5.1)
Sodium: 136 mmol/L (ref 135–145)

## 2023-03-08 LAB — RESPIRATORY PANEL BY PCR

## 2023-03-08 LAB — CBC
HCT: 38.4 % — ABNORMAL LOW (ref 39.0–52.0)
Hemoglobin: 12.5 g/dL — ABNORMAL LOW (ref 13.0–17.0)
MCH: 27.7 pg (ref 26.0–34.0)
MCHC: 32.6 g/dL (ref 30.0–36.0)
MCV: 85 fL (ref 80.0–100.0)
Platelets: 383 10*3/uL (ref 150–400)
RBC: 4.52 MIL/uL (ref 4.22–5.81)
RDW: 13.6 % (ref 11.5–15.5)
WBC: 10.4 10*3/uL (ref 4.0–10.5)
nRBC: 0 % (ref 0.0–0.2)

## 2023-03-08 LAB — D-DIMER, QUANTITATIVE: D-Dimer, Quant: 0.28 ug{FEU}/mL (ref 0.00–0.50)

## 2023-03-08 LAB — BRAIN NATRIURETIC PEPTIDE: B Natriuretic Peptide: 13.3 pg/mL (ref 0.0–100.0)

## 2023-03-08 MED ORDER — GUAIFENESIN ER 600 MG PO TB12
600.0000 mg | ORAL_TABLET | Freq: Two times a day (BID) | ORAL | Status: DC
  Administered 2023-03-08 – 2023-03-10 (×5): 600 mg via ORAL
  Filled 2023-03-08 (×5): qty 1

## 2023-03-08 MED ORDER — IPRATROPIUM-ALBUTEROL 0.5-2.5 (3) MG/3ML IN SOLN
3.0000 mL | Freq: Four times a day (QID) | RESPIRATORY_TRACT | Status: DC
  Administered 2023-03-08 – 2023-03-10 (×8): 3 mL via RESPIRATORY_TRACT
  Filled 2023-03-08 (×8): qty 3

## 2023-03-08 MED ORDER — METOPROLOL TARTRATE 25 MG PO TABS
25.0000 mg | ORAL_TABLET | Freq: Two times a day (BID) | ORAL | Status: DC
  Administered 2023-03-08 (×2): 25 mg via ORAL
  Filled 2023-03-08 (×3): qty 1

## 2023-03-08 MED ORDER — HYDROCOD POLI-CHLORPHE POLI ER 10-8 MG/5ML PO SUER
5.0000 mL | Freq: Two times a day (BID) | ORAL | Status: DC | PRN
  Administered 2023-03-08: 5 mL via ORAL
  Filled 2023-03-08: qty 5

## 2023-03-08 MED ORDER — IPRATROPIUM-ALBUTEROL 0.5-2.5 (3) MG/3ML IN SOLN: 3.0000 mL | Freq: Three times a day (TID) | RESPIRATORY_TRACT | Status: DC

## 2023-03-08 MED ORDER — DIPHENHYDRAMINE HCL 25 MG PO CAPS
25.0000 mg | ORAL_CAPSULE | Freq: Every evening | ORAL | Status: DC | PRN
  Administered 2023-03-09 (×2): 25 mg via ORAL
  Filled 2023-03-08 (×2): qty 1

## 2023-03-08 MED ORDER — IPRATROPIUM-ALBUTEROL 0.5-2.5 (3) MG/3ML IN SOLN
3.0000 mL | Freq: Once | RESPIRATORY_TRACT | Status: AC
  Administered 2023-03-08: 3 mL via RESPIRATORY_TRACT

## 2023-03-08 MED ORDER — PREDNISONE 20 MG PO TABS
40.0000 mg | ORAL_TABLET | Freq: Every day | ORAL | Status: DC
Start: 2023-03-10 — End: 2023-03-13
  Administered 2023-03-10: 40 mg via ORAL
  Filled 2023-03-08: qty 2

## 2023-03-08 MED ORDER — PANTOPRAZOLE SODIUM 40 MG PO TBEC
40.0000 mg | DELAYED_RELEASE_TABLET | Freq: Every day | ORAL | Status: DC
  Administered 2023-03-08 – 2023-03-10 (×3): 40 mg via ORAL
  Filled 2023-03-08 (×3): qty 1

## 2023-03-08 MED ORDER — FLUTICASONE FUROATE-VILANTEROL 200-25 MCG/ACT IN AEPB
1.0000 | INHALATION_SPRAY | Freq: Every day | RESPIRATORY_TRACT | Status: DC
  Administered 2023-03-08 – 2023-03-10 (×3): 1 via RESPIRATORY_TRACT
  Filled 2023-03-08: qty 28

## 2023-03-08 MED ORDER — ALBUTEROL SULFATE (2.5 MG/3ML) 0.083% IN NEBU
2.5000 mg | INHALATION_SOLUTION | RESPIRATORY_TRACT | Status: DC | PRN
  Administered 2023-03-08 – 2023-03-09 (×2): 2.5 mg via RESPIRATORY_TRACT
  Filled 2023-03-08 (×2): qty 3

## 2023-03-08 MED ORDER — SODIUM CHLORIDE 0.9 % IV SOLN
500.0000 mg | INTRAVENOUS | Status: DC
  Administered 2023-03-08 – 2023-03-09 (×2): 500 mg via INTRAVENOUS
  Filled 2023-03-08 (×4): qty 5

## 2023-03-08 MED ORDER — PREDNISONE 10 MG PO TABS: 10.0000 mg | ORAL_TABLET | Freq: Every day | ORAL | Status: DC

## 2023-03-08 MED ORDER — PREDNISONE 20 MG PO TABS: 20.0000 mg | ORAL_TABLET | Freq: Every day | ORAL | Status: DC

## 2023-03-08 MED ORDER — PREDNISONE 20 MG PO TABS: 30.0000 mg | ORAL_TABLET | Freq: Every day | ORAL | Status: DC

## 2023-03-08 MED ORDER — LORATADINE 10 MG PO TABS
10.0000 mg | ORAL_TABLET | Freq: Every day | ORAL | Status: DC
  Administered 2023-03-08 – 2023-03-10 (×3): 10 mg via ORAL
  Filled 2023-03-08 (×3): qty 1

## 2023-03-08 MED ORDER — METHYLPREDNISOLONE SODIUM SUCC 40 MG IJ SOLR
40.0000 mg | Freq: Two times a day (BID) | INTRAMUSCULAR | Status: AC
  Administered 2023-03-08 – 2023-03-09 (×3): 40 mg via INTRAVENOUS
  Filled 2023-03-08 (×3): qty 1

## 2023-03-08 NOTE — TOC Progression Note (Signed)
 Transition of Care [facility]) - Progression Note    Patient Details  Name: LAKOTA MARKGRAF MRN: [id number] Date of Birth: 1980/06/08  [facility] of [facility]) CM/SW Contact  Marlowe Sax, RN Phone Number: 03/08/2023, 1:02 PM  Clinical Narrative:    Kiven Vangilder is a 43 year old male with history of asthma, hypertension, morbid obesity, continued tobacco use, frequent hospitalization for asthma exacerbation  He received a nebulizer and oxygen in Feb of this year, has been provided [facility] and information provided in the last several visits as well as PCP list, he received a nebulizer last visit for home,. Lives at home with his Gilfriend TOC to continue to follow for additional needs   Expected Discharge Plan: Home/Self Care Barriers to Discharge: Inadequate or no insurance  Expected Discharge Plan and Services   Discharge Planning Services: CM Consult   Living arrangements for the past 2 months: Single Family Home                   DME Agency: NA       HH Arranged: NA           Social Determinants of Health (SDOH) Interventions SDOH Screenings   Food Insecurity: Food Insecurity Present (03/07/2023)  Housing: High Risk (03/07/2023)  Transportation Needs: No Transportation Needs (03/07/2023)  Utilities: At Risk (03/07/2023)  Social Connections: Unknown (03/07/2023)  Tobacco Use: High Risk (03/07/2023)    Readmission Risk Interventions    03/08/2023    1:02 PM  Readmission Risk Prevention Plan  Transportation Screening Complete  PCP or Specialist Appt within 3-5 Days --  HRI or Home Care Consult Complete  Social Work Consult for Recovery Care Planning/Counseling Complete  Palliative Care Screening Not Applicable  Medication Review Oceanographer) Referral to Pharmacy

## 2023-03-08 NOTE — Progress Notes (Signed)
 1610 Resp panel collected and sent down to lab.

## 2023-03-08 NOTE — Discharge Instructions (Signed)
 Some PCP options in [place] area- not a comprehensive list  [facility]- [phone] Corinda Gubler- [phone] [facility]- [phone] [facility]- [phone] [facility]- [phone] Lutricia Horsfall- [phone]  or [facility] Physician Referral Line [phone]  You are encouraged to call the [facility] and arrange an application appointment to be screened for service to get set up with a physician for primary care  You may print the application and take with you to the appointment. At this web address [email or web address] Please Call [facility] for appointment  [phone]  [address] [place], [place] [address]  Office Hours Monday: Closed Tuesday: 9:00am - 4:00pm Wednesday: 9:00am - 4:00pm Thursday: 9:00am - 8:00pm Friday: Closed  Endocrinology 2nd Thursday of the Month: 5:00pm - 8:00pm  Rheumatology/Orthopedic Clinic By appointment only.  Contact for availability.     Services Not Covered Obstetrics Gastrointestinal/Liver Disease

## 2023-03-08 NOTE — Progress Notes (Signed)
 [facility] Progress Note  Patient: Justin Reid    WUJ:[id number]  DOA: 03/07/2023     Date of Service: the patient was seen and examined on 03/08/2023  Chief Complaint  Patient presents with   Shortness of Breath   Brief hospital course: Justin Reid is a 43 year old male with history of asthma, hypertension, morbid obesity, continued tobacco use, frequent hospitalization for asthma exacerbation, who presents emergency department for chief concerns of shortness of breath.   Vitals in the ED showed temperature 98, respiration rate of 24, heart rate 93, blood pressure 145/107, SpO2 97% on room air.  Patient had SpO2 mild desaturation and was placed on 3 L nasal cannula with SpO2 of 90%.   Serum sodium is 137, potassium 4.1, chloride 102, bicarb 25, BUN of 13, serum creatinine of 0.87, EGFR greater than 60, nonfasting blood glucose 109, WBC 7.6, hemoglobin 13.7, platelets of 798.   COVID/influenza A/influenza B/RSV PCR were negative.   ED treatment: DuoNebs 3 treatments, Solu-Medrol 125 mg IV one-time dose.   Assessment and Plan:  Acute bronchitis vs acute severe persistent asthma exacerbation D-dimer 0.23 negative, less likely PE BNP 13.3  Negative COVID, RSV and flu, RVP pcr panel negative S/p Solu-Medrol 125 mg IV followed by 40 mg IV twice daily for 2 days followed by oral prednisone taper Empirically started azithromycin 500 mg IV daily for 3 days Started Breo Ellipta inhaler, DuoNeb every 6 hourly scheduled Started Claritin 10 mg p.o. daily and Mucinex extremity gram p.o. twice daily Tussionex q12 prn for cough Continue PPI for GI prophylaxis  Hypertension, started Lopressor 25 mg p.o. twice daily Monitor BP and titrate medications accordingly  Nicotine dependence, smoking cessation counseling done   OSA, obesity hypoventilation syndrome patient needs sleep study as an outpatient Continue CPAP nightly  Morbid obesity Body mass index is 44.3 kg/m.   Interventions:Calorie restricted diet and daily exercise advised to lose body weight.  Lifestyle modification discussed.   Diet: Heart healthy diet DVT Prophylaxis: Subcutaneous Lovenox   Advance goals of care discussion: Full code  Family Communication: family was not present at bedside, at the time of interview.  The pt provided permission to discuss medical plan with the family. Opportunity was given to ask question and all questions were answered satisfactorily.   Disposition:  Pt is from Home, admitted with Asthma exacerbation, still has significant dyspnea on exertion and shortness of breath, which precludes a safe discharge. Discharge to Home, when when stable, most likely in 1 to 2 days.  Subjective: No significant events overnight, patient well-appearing CPAP in the morning, stated that he is still feels dyspnea on exertion, short of breath, denied any chest pain or palpitations.  Physical Exam: General: NAD, lying comfortably Appear in no distress, affect appropriate Eyes: PERRLA ENT: Oral Mucosa Clear, moist  Neck: no JVD,  Cardiovascular: S1 and S2 Present, no Murmur,  Respiratory: Good air entry bilaterally, bilateral wheezes. Abdomen: Bowel Sound present, Soft and no tenderness,  Skin: no rashes Extremities: no Pedal edema, no calf tenderness Neurologic: without any new focal findings Gait not checked due to patient safety concerns  Vitals:   03/07/23 2131 03/07/23 2326 03/08/23 0747 03/08/23 0825  BP:  133/86 (!) 145/96   Pulse: 91 (!) 110 93 (!) 103  Resp:  16 20 18   Temp:  98.3 F (36.8 C) 97.9 F (36.6 C)   TempSrc:   Oral   SpO2: 96% 95% 96% 95%  Weight:  Height:        Intake/Output Summary (Last 24 hours) at 03/08/2023 1515 Last data filed at 03/08/2023 1610 Gross per 24 hour  Intake 360 ml  Output --  Net 360 ml   Filed Weights   03/07/23 1012  Weight: 136.1 kg    Data Reviewed: I have personally reviewed and interpreted daily labs,  tele strips, imagings as discussed above. I reviewed all nursing notes, pharmacy notes, vitals, pertinent old records I have discussed plan of care as described above with RN and patient/family.  CBC: Recent Labs  Lab 03/07/23 1000 03/08/23 0552  WBC 7.6 10.4  HGB 13.7 12.5*  HCT 43.0 38.4*  MCV 86.2 85.0  PLT 398 383   Basic Metabolic Panel: Recent Labs  Lab 03/07/23 1000 03/08/23 0552  NA 137 136  K 4.1 3.8  CL 102 102  CO2 25 24  GLUCOSE 109* 159*  BUN 13 18  CREATININE 0.87 1.11  CALCIUM 9.5 9.1    Studies: No results found.  Scheduled Meds:  enoxaparin (LOVENOX) injection  70 mg Subcutaneous Q24H   fluticasone furoate-vilanterol  1 puff Inhalation Daily   guaiFENesin  600 mg Oral BID   ipratropium-albuterol  3 mL Nebulization Q6H   loratadine  10 mg Oral Daily   methylPREDNISolone (SOLU-MEDROL) injection  40 mg Intravenous Q12H   Followed by   Melene Muller ON 03/10/2023] predniSONE  40 mg Oral Q breakfast   Followed by   Melene Muller ON 03/13/2023] predniSONE  30 mg Oral Q breakfast   Followed by   Melene Muller ON 03/16/2023] predniSONE  20 mg Oral Q breakfast   Followed by   Melene Muller ON 03/19/2023] predniSONE  10 mg Oral Q breakfast   metoprolol tartrate  25 mg Oral BID   pantoprazole  40 mg Oral Daily   Continuous Infusions: PRN Meds: acetaminophen **OR** acetaminophen, diphenhydrAMINE, hydrALAZINE, ondansetron **OR** ondansetron (ZOFRAN) IV, senna-docusate  Time spent: 55 minutes  Author: Gillis Santa. MD [facility] 03/08/2023 3:15 PM  To reach On-call, see care teams to locate the attending and reach out to them via [email or web address]. If 7PM-7AM, please contact night-coverage If you still have difficulty reaching the attending provider, please page the [facility] (Director on Call) for [facility] on amion for assistance.

## 2023-03-09 ENCOUNTER — Inpatient Hospital Stay (HOSPITAL_COMMUNITY)
Admit: 2023-03-09 | Discharge: 2023-03-09 | Disposition: A | Discharge status: Home / Self Care | Payer: MEDICAID | Attending: Student | Admitting: Student

## 2023-03-09 DIAGNOSIS — I509 Heart failure, unspecified: Secondary | ICD-10-CM

## 2023-03-09 LAB — ECHOCARDIOGRAM COMPLETE
AR max vel: 3.1 cm2
AV Area VTI: 3.19 cm2
AV Area mean vel: 2.96 cm2
AV Mean grad: 2 mmHg
AV Peak grad: 4.2 mmHg
Ao pk vel: 1.03 m/s
Area-P 1/2: 3.39 cm2
Calc EF: 63.1 %
Height: 69 in
MV VTI: 1.93 cm2
S' Lateral: 3 cm
Single Plane A2C EF: 59.5 %
Single Plane A4C EF: 64.7 %
Weight: 4800 [oz_av]

## 2023-03-09 LAB — BASIC METABOLIC PANEL
Anion gap: 7 (ref 5–15)
BUN: 16 mg/dL (ref 6–20)
CO2: 23 mmol/L (ref 22–32)
Calcium: 9 mg/dL (ref 8.9–10.3)
Chloride: 107 mmol/L (ref 98–111)
Creatinine, Ser: 1.02 mg/dL (ref 0.61–1.24)
GFR, Estimated: >60 mL/min (ref >60)
Glucose, Bld: 204 mg/dL — ABNORMAL HIGH (ref 70–99)
Potassium: 4.4 mmol/L (ref 3.5–5.1)
Sodium: 137 mmol/L (ref 135–145)

## 2023-03-09 LAB — CBC
HCT: 37.6 % — ABNORMAL LOW (ref 39.0–52.0)
Hemoglobin: 12.4 g/dL — ABNORMAL LOW (ref 13.0–17.0)
MCH: 28.4 pg (ref 26.0–34.0)
MCHC: 33 g/dL (ref 30.0–36.0)
MCV: 86.2 fL (ref 80.0–100.0)
Platelets: 373 10*3/uL (ref 150–400)
RBC: 4.36 MIL/uL (ref 4.22–5.81)
RDW: 14.1 % (ref 11.5–15.5)
WBC: 15.6 10*3/uL — ABNORMAL HIGH (ref 4.0–10.5)
nRBC: 0 % (ref 0.0–0.2)

## 2023-03-09 LAB — HEMOGLOBIN A1C
Hgb A1c MFr Bld: 5.8 % — ABNORMAL HIGH (ref 4.8–5.6)
Mean Plasma Glucose: 119.76 mg/dL

## 2023-03-09 LAB — GLUCOSE, CAPILLARY
Glucose-Capillary: 146 mg/dL — ABNORMAL HIGH (ref 70–99)
Glucose-Capillary: 170 mg/dL — ABNORMAL HIGH (ref 70–99)

## 2023-03-09 LAB — MAGNESIUM: Magnesium: 2.4 mg/dL (ref 1.7–2.4)

## 2023-03-09 LAB — PHOSPHORUS: Phosphorus: 2.9 mg/dL (ref 2.5–4.6)

## 2023-03-09 MED ORDER — INSULIN ASPART 100 UNIT/ML IJ SOLN
0.0000 [IU] | Freq: Three times a day (TID) | INTRAMUSCULAR | Status: DC
  Administered 2023-03-09: 3 [IU] via SUBCUTANEOUS
  Administered 2023-03-10: 4 [IU] via SUBCUTANEOUS
  Administered 2023-03-10: 3 [IU] via SUBCUTANEOUS
  Filled 2023-03-09 (×3): qty 1

## 2023-03-09 MED ORDER — AMLODIPINE BESYLATE 5 MG PO TABS
5.0000 mg | ORAL_TABLET | Freq: Every day | ORAL | Status: DC
  Administered 2023-03-09 – 2023-03-10 (×2): 5 mg via ORAL
  Filled 2023-03-09 (×2): qty 1

## 2023-03-09 MED ORDER — OXYCODONE HCL 5 MG PO TABS
5.0000 mg | ORAL_TABLET | Freq: Four times a day (QID) | ORAL | Status: DC | PRN
  Administered 2023-03-09: 5 mg via ORAL
  Filled 2023-03-09: qty 1

## 2023-03-09 MED ORDER — METOPROLOL TARTRATE 50 MG PO TABS
50.0000 mg | ORAL_TABLET | Freq: Two times a day (BID) | ORAL | Status: DC
  Administered 2023-03-09 – 2023-03-10 (×3): 50 mg via ORAL
  Filled 2023-03-09 (×3): qty 1

## 2023-03-09 MED ORDER — ALBUTEROL SULFATE (2.5 MG/3ML) 0.083% IN NEBU: 3.0000 mL | INHALATION_SOLUTION | RESPIRATORY_TRACT | Status: DC | PRN

## 2023-03-09 MED ORDER — ALBUTEROL SULFATE HFA 108 (90 BASE) MCG/ACT IN AERS
2.0000 | INHALATION_SPRAY | RESPIRATORY_TRACT | Status: DC | PRN
  Administered 2023-03-09: 2 via RESPIRATORY_TRACT
  Filled 2023-03-09: qty 6.7

## 2023-03-09 NOTE — Progress Notes (Addendum)
 [facility] Progress Note  Patient: Justin Reid    VWU:[id number]  DOA: 03/07/2023     Date of Service: the patient was seen and examined on 03/09/2023  Chief Complaint  Patient presents with   Shortness of Breath   Brief hospital course: [name] is a 43 year old male with history of asthma, hypertension, morbid obesity, continued tobacco use, frequent hospitalization for asthma exacerbation, who presents emergency department for chief concerns of shortness of breath.   Vitals in the ED showed temperature 98, respiration rate of 24, heart rate 93, blood pressure 145/107, SpO2 97% on room air.  Patient had SpO2 mild desaturation and was placed on 3 L nasal cannula with SpO2 of 90%.   Serum sodium is 137, potassium 4.1, chloride 102, bicarb 25, BUN of 13, serum creatinine of 0.87, EGFR greater than 60, nonfasting blood glucose 109, WBC 7.6, hemoglobin 13.7, platelets of 798.   COVID/influenza A/influenza B/RSV PCR were negative.   ED treatment: DuoNebs 3 treatments, Solu-Medrol 125 mg IV one-time dose.   Assessment and Plan:  # Acute bronchitis vs acute severe persistent asthma exacerbation D-dimer 0.23 negative, less likely PE BNP 13.3  Negative COVID, RSV and flu, RVP pcr panel negative S/p Solu-Medrol 125 mg IV followed by 40 mg IV twice daily for 2 days followed by oral prednisone taper Empirically started azithromycin 500 mg IV daily for 3 days Started Breo Ellipta inhaler, DuoNeb every 6 hourly scheduled Started Claritin 10 mg p.o. daily and Mucinex extremity gram p.o. twice daily Tussionex q12 prn for cough Continue PPI for GI prophylaxis  # Hyperglycemia most likely due to steroids Check hemoglobin A1c Start insulin sliding scale Monitor CBG, continue diabetic diet  # Hypertension,  2/5 increased metoprolol 50 mg p.o. twice daily Monitor BP and titrate medications accordingly Check 2D echocardiogram  # Nicotine dependence, smoking cessation  counseling done  # Osteoarthritis of bilateral knee Continue as needed pain meds   # OSA, obesity hypoventilation syndrome patient needs sleep study as an outpatient Continue CPAP nightly  # Morbid obesity Body mass index is 44.3 kg/m.  Interventions:Calorie restricted diet and daily exercise advised to lose body weight.  Lifestyle modification discussed.   Diet: Heart healthy/carb modified diet DVT Prophylaxis: Subcutaneous Lovenox   Advance goals of care discussion: Full code  Family Communication: family was not present at bedside, at the time of interview.  The pt provided permission to discuss medical plan with the family. Opportunity was given to ask question and all questions were answered satisfactorily.   Disposition:  Pt is from Home, admitted with Asthma exacerbation, still has significant dyspnea on exertion and shortness of breath, which precludes a safe discharge. Discharge to Home, when when stable, most likely in 1 to 2 days.  Subjective: No significant events overnight, patient is still having significant shortness of breath and wheezing.  Complaining of pain in the bilateral knees due to chronic osteoarthritis.  No any other complaints.   Physical Exam: General: NAD, lying comfortably Appear in no distress, affect appropriate Eyes: PERRLA ENT: Oral Mucosa Clear, moist  Neck: no JVD,  Cardiovascular: S1 and S2 Present, no Murmur,  Respiratory: Good air entry bilaterally, bilateral wheezes. Abdomen: Bowel Sound present, Soft and no tenderness,  Skin: no rashes Extremities: no Pedal edema, no calf tenderness Neurologic: without any new focal findings Gait not checked due to patient safety concerns  Vitals:   03/08/23 2026 03/08/23 2111 03/09/23 0718 03/09/23 0744  BP:  (!) 141/91  Marland Kitchen)  149/97  Pulse:  88 81 92  Resp:  18 18 17   Temp:  97.6 F (36.4 C)  97.6 F (36.4 C)  TempSrc:  Oral    SpO2: 95% 97% 96% 95%  Weight:      Height:         Intake/Output Summary (Last 24 hours) at 03/09/2023 1129 Last data filed at 03/09/2023 1610 Gross per 24 hour  Intake 250.48 ml  Output --  Net 250.48 ml   Filed Weights   03/07/23 1012  Weight: 136.1 kg    Data Reviewed: I have personally reviewed and interpreted daily labs, tele strips, imagings as discussed above. I reviewed all nursing notes, pharmacy notes, vitals, pertinent old records I have discussed plan of care as described above with RN and patient/family.  CBC: Recent Labs  Lab 03/07/23 1000 03/08/23 0552 03/09/23 0448  WBC 7.6 10.4 15.6*  HGB 13.7 12.5* 12.4*  HCT 43.0 38.4* 37.6*  MCV 86.2 85.0 86.2  PLT 398 383 373   Basic Metabolic Panel: Recent Labs  Lab 03/07/23 1000 03/08/23 0552 03/09/23 0448  NA 137 136 137  K 4.1 3.8 4.4  CL 102 102 107  CO2 25 24 23   GLUCOSE 109* 159* 204*  BUN 13 18 16   CREATININE 0.87 1.11 1.02  CALCIUM 9.5 9.1 9.0  MG  --   --  2.4  PHOS  --   --  2.9    Studies: No results found.  Scheduled Meds:  enoxaparin (LOVENOX) injection  70 mg Subcutaneous Q24H   fluticasone furoate-vilanterol  1 puff Inhalation Daily   guaiFENesin  600 mg Oral BID   ipratropium-albuterol  3 mL Nebulization Q6H   loratadine  10 mg Oral Daily   methylPREDNISolone (SOLU-MEDROL) injection  40 mg Intravenous Q12H   Followed by   Melene Muller ON 03/10/2023] predniSONE  40 mg Oral Q breakfast   Followed by   Melene Muller ON 03/13/2023] predniSONE  30 mg Oral Q breakfast   Followed by   Melene Muller ON 03/16/2023] predniSONE  20 mg Oral Q breakfast   Followed by   Melene Muller ON 03/19/2023] predniSONE  10 mg Oral Q breakfast   metoprolol tartrate  50 mg Oral BID   pantoprazole  40 mg Oral Daily   Continuous Infusions:  azithromycin Stopped (03/08/23 1716)   PRN Meds: acetaminophen **OR** acetaminophen, albuterol, chlorpheniramine-HYDROcodone, diphenhydrAMINE, hydrALAZINE, ondansetron **OR** ondansetron (ZOFRAN) IV, oxyCODONE, senna-docusate  Time spent: 55  minutes  Author: Gillis Santa. MD [facility] 03/09/2023 11:29 AM  To reach On-call, see care teams to locate the attending and reach out to them via [email or web address]. If 7PM-7AM, please contact night-coverage If you still have difficulty reaching the attending provider, please page the [facility] (Director on Call) for [facility] on amion for assistance.

## 2023-03-09 NOTE — Plan of Care (Signed)

## 2023-03-09 NOTE — Progress Notes (Signed)
*  PRELIMINARY RESULTS* Echocardiogram 2D Echocardiogram has been performed.  Carolyne Fiscal 03/09/2023, 3:28 PM

## 2023-03-10 ENCOUNTER — Other Ambulatory Visit: Payer: Self-pay

## 2023-03-10 LAB — BASIC METABOLIC PANEL
Anion gap: 7 (ref 5–15)
BUN: 17 mg/dL (ref 6–20)
CO2: 26 mmol/L (ref 22–32)
Calcium: 8.9 mg/dL (ref 8.9–10.3)
Chloride: 105 mmol/L (ref 98–111)
Creatinine, Ser: 0.93 mg/dL (ref 0.61–1.24)
GFR, Estimated: >60 mL/min (ref >60)
Glucose, Bld: 143 mg/dL — ABNORMAL HIGH (ref 70–99)
Potassium: 4 mmol/L (ref 3.5–5.1)
Sodium: 138 mmol/L (ref 135–145)

## 2023-03-10 LAB — CBC
HCT: 38.3 % — ABNORMAL LOW (ref 39.0–52.0)
Hemoglobin: 12.2 g/dL — ABNORMAL LOW (ref 13.0–17.0)
MCH: 27.7 pg (ref 26.0–34.0)
MCHC: 31.9 g/dL (ref 30.0–36.0)
MCV: 86.8 fL (ref 80.0–100.0)
Platelets: 351 10*3/uL (ref 150–400)
RBC: 4.41 MIL/uL (ref 4.22–5.81)
RDW: 14.2 % (ref 11.5–15.5)
WBC: 15.5 10*3/uL — ABNORMAL HIGH (ref 4.0–10.5)
nRBC: 0 % (ref 0.0–0.2)

## 2023-03-10 LAB — GLUCOSE, CAPILLARY
Glucose-Capillary: 131 mg/dL — ABNORMAL HIGH (ref 70–99)
Glucose-Capillary: 169 mg/dL — ABNORMAL HIGH (ref 70–99)

## 2023-03-10 LAB — MAGNESIUM: Magnesium: 2.2 mg/dL (ref 1.7–2.4)

## 2023-03-10 LAB — PHOSPHORUS: Phosphorus: 3.9 mg/dL (ref 2.5–4.6)

## 2023-03-10 MED ORDER — PREDNISONE 20 MG PO TABS
ORAL_TABLET | ORAL | 0 refills | Status: AC
  Filled 2023-03-10: qty 5, 4d supply, fill #0

## 2023-03-10 MED ORDER — AZITHROMYCIN 500 MG PO TABS
500.0000 mg | ORAL_TABLET | Freq: Once | ORAL | Status: AC
  Administered 2023-03-10: 500 mg via ORAL
  Filled 2023-03-10: qty 1

## 2023-03-10 MED ORDER — IPRATROPIUM-ALBUTEROL 0.5-2.5 (3) MG/3ML IN SOLN: 3.0000 mL | Freq: Three times a day (TID) | RESPIRATORY_TRACT | Status: DC

## 2023-03-10 MED ORDER — DEXTROMETHORPHAN-GUAIFENESIN 20-200 MG/20ML PO LIQD
10.0000 mL | Freq: Four times a day (QID) | ORAL | 0 refills | Status: DC | PRN
  Filled 2023-03-10: qty 118, 2d supply, fill #0

## 2023-03-10 MED ORDER — METOPROLOL TARTRATE 50 MG PO TABS
50.0000 mg | ORAL_TABLET | Freq: Two times a day (BID) | ORAL | 2 refills | Status: DC
  Filled 2023-03-10: qty 60, 30d supply, fill #0

## 2023-03-10 NOTE — Progress Notes (Signed)
 Patient known to nurse navigator from previous admission. Patient previously discharged home WITHOUT home oxygen, as it was determined he did NOT need it, but WAS discharged with a nebulizer. Patient without electricity since mid February, now facing eviction.  Patient provided with number for [facility] to speak with Eunice Blase in social work.  Patient's significant other, Nivea, at bedside and reports her new job with [employer] is going well. She anticipates her first pay check to be on the 25th of this month. SDOH needs shared with [facility] and attending MD, as well.  Will continue to follow along. Patient encouraged to call nurse navigator with any questions or concerns.

## 2023-03-10 NOTE — Progress Notes (Signed)
 Reviewed discharge instructions with patient. Patient acknowledged understanding. Patient received two copies of doctor's note for work. Patient discharged with personal belongings and wheeled out by staff. No distress noted in patient. Patient transported via family vehicle.

## 2023-03-10 NOTE — Discharge Summary (Signed)
 [facility] Discharge Summary   Patient: Justin Reid QIO:[id number]  PCP: Pcp, No  Date of admission: 03/07/2023   Date of discharge:  03/10/2023     Discharge Diagnoses:  Principal Problem:   Acute severe exacerbation of severe persistent asthma Active Problems:   Asthma   Obesity hypoventilation syndrome (HCC)   Tobacco abuse   At risk for sleep apnea   OSA (obstructive sleep apnea)   HTN (hypertension)   Truncal obesity   Obesity, Class III, BMI 40-49.9 (morbid obesity) (HCC)   Admitted From: Home Disposition:  Home    Recommendations for Outpatient Follow-up:  PCP: In 1 week Follow up LABS/TEST:    Diet recommendation: Cardiac and Carb modified diet  Activity: The patient is advised to gradually reintroduce usual activities, as tolerated  Discharge Condition: stable  Code Status: Full code   History of present illness: As per the H and P dictated on admission. Hospital Course:  Mr. Ajay Strubel is a 43 year old male with history of asthma, hypertension, morbid obesity, continued tobacco use, frequent hospitalization for asthma exacerbation, who presents emergency department for chief concerns of shortness of breath. ED w/up: Temp 98, RR 24, HR 93, BP 145/107, SpO2 97% on RA.  Patient had SpO2 mild desaturation and was placed on 3 L nasal cannula with SpO2 of 90%. BMP: Na 137, K 4.1, chloride 102, bicarb 25, BUN of 13, S. Cr  0.87, eGFR>60, nonfasting blood glucose 109, WBC 7.6, Hb 13.7, plts 798. COVID/influenza A/influenza B/RSV PCR were negative.  ED treatment: DuoNebs 3 treatments, Solu-Medrol 125 mg IV one-time dose.  Assessment and Plan:  # Acute bronchitis vs acute severe persistent asthma exacerbation D-dimer 0.23 negative, less likely PE BNP 13.3, Negative COVID, RSV and flu, RVP pcr panel negative S/p Solu-Medrol 125 mg IV followed by 40 mg IV twice daily for 2 days followed by oral prednisone taper. Empirically started azithromycin 500 mg IV daily  for 3 days. Started Breo Ellipta inhaler, DuoNeb every 6 hourly scheduled Started Claritin 10 mg p.o. daily and Mucinex extremity gram p.o. twice daily Tussionex q12 prn for cough. Continue PPI for GI prophylaxis Patient's symptoms improved, discharged on prednisone tapering dose, continued DuoNeb and albuterol prn, Robitussin DM prn, and flu cortisone-salmeterol inhaler.  Patient was advised to follow-up with PCP and pulmonologist in 1 to 2 weeks.  # Hyperglycemia most likely due to steroids hemoglobin A1c 5.8 slightly elevated, falls in prediabetic range. S/p insulin sliding scale during hospital stay.  Patient was advised to continue diabetic diet and lose body weight.  Follow with PCP. # Hypertension, started metoprolol 50 mg p.o. twice daily and amlodipine 5 mg p.o. daily.  Patient blood pressure was elevated, advised to continue to monitor BP at home and follow with PCP to titrate medications accordingly.   TTE LVEF 60 to 65%, no wall motion abnormality, mild LV hypertrophy.  No significant valvular abnormality. # Nicotine dependence, smoking cessation counseling done # Osteoarthritis of bilateral knee: Continue as needed pain meds # OSA, obesity hypoventilation syndrome: patient needs sleep study as an outpatient. S/p CPAP at bedtime given during hospital stay. # Morbid obesity Body mass index is 44.3 kg/m.  Interventions:Calorie restricted diet and daily exercise advised to lose body weight.  Lifestyle modification discussed.  Patient was ambulatory without any assistance. On the day of the discharge the patient's vitals were stable, and no other acute medical condition were reported by patient. the patient was felt safe to be discharge at Home.  Consultants: None Procedures: None  Discharge Exam: General: Appear in no distress, no Rash; Oral Mucosa Clear, moist. Cardiovascular: S1 and S2 Present, no Murmur, Respiratory: normal respiratory effort, Bilateral Air entry present and no  Crackles, minimal wheezes Abdomen: Bowel Sound present, Soft and no tenderness, no hernia Extremities: no Pedal edema, no calf tenderness Neurology: alert and oriented to time, place, and person affect appropriate.  Filed Weights   03/07/23 1012  Weight: 136.1 kg   Vitals:   03/10/23 0750 03/10/23 0753  BP: (!) 138/91   Pulse: 65   Resp: 17   Temp:    SpO2: 95% 96%    DISCHARGE MEDICATION: Allergies as of 03/10/2023   No Known Allergies      Medication List     TAKE these medications    albuterol 108 (90 Base) MCG/ACT inhaler Commonly known as: VENTOLIN HFA Inhale 2 puffs into the lungs every 4 (four) hours as needed for wheezing or shortness of breath.   amLODipine 5 MG tablet Commonly known as: NORVASC Take 1 tablet (5 mg total) by mouth daily.   FT Mucus Relief 12HR 600 MG 12 hr tablet Generic drug: guaiFENesin Take 2 tablets (1,200 mg total) by mouth 2 (two) times daily as needed for cough or to loosen phlegm.   guaiFENesin-dextromethorphan 100-10 MG/5ML syrup Commonly known as: ROBITUSSIN DM Take 10 mLs by mouth every 6 (six) hours as needed for cough.   ipratropium-albuterol 0.5-2.5 (3) MG/3ML Soln Commonly known as: DUONEB Take 3 mLs by nebulization every 2 (two) hours as needed (wheezing, shortness of breath).   loratadine 10 MG tablet Commonly known as: CLARITIN Take 1 tablet (10 mg total) by mouth daily.   metoprolol tartrate 50 MG tablet Commonly known as: LOPRESSOR Take 1 tablet (50 mg total) by mouth 2 (two) times daily.   montelukast 10 MG tablet Commonly known as: SINGULAIR Take 1 tablet (10 mg total) by mouth daily.   nicotine 14 mg/24hr patch Commonly known as: NICODERM CQ - dosed in mg/24 hours Place 1 patch (14 mg total) onto the skin daily as needed (nicotine craving).   pantoprazole 40 MG tablet Commonly known as: PROTONIX Take 1 tablet (40 mg total) by mouth daily.   predniSONE 20 MG tablet Commonly known as: DELTASONE Take 2  tablets (40 mg total) by mouth daily with breakfast for 1 day, THEN 1.5 tablets (30 mg total) daily with breakfast for 1 day, THEN 1 tablet (20 mg total) daily with breakfast for 1 day, THEN 0.5 tablets (10 mg total) daily with breakfast for 1 day. Start taking on: March 10, 2023   Wixela Inhub 250-50 MCG/ACT Aepb Generic drug: fluticasone-salmeterol Inhale 1 puff into the lungs in the morning and at bedtime.       No Known Allergies Discharge Instructions     Call MD for:  difficulty breathing, headache or visual disturbances   Complete by: As directed    Call MD for:  extreme fatigue   Complete by: As directed    Call MD for:  persistant dizziness or light-headedness   Complete by: As directed    Call MD for:  persistant nausea and vomiting   Complete by: As directed    Call MD for:  severe uncontrolled pain   Complete by: As directed    Call MD for:  temperature >100.4   Complete by: As directed    Diet - low sodium heart healthy   Complete by: As directed    Discharge  instructions   Complete by: As directed    Follow-up with PCP in 1 week   Increase activity slowly   Complete by: As directed        The results of significant diagnostics from this hospitalization (including imaging, microbiology, ancillary and laboratory) are listed below for reference.    Significant Diagnostic Studies: ECHOCARDIOGRAM COMPLETE Result Date: 03/09/2023    ECHOCARDIOGRAM REPORT   Patient Name:   KAIVEN VESTER Date of Exam: 03/09/2023 Medical Rec #:  [id number]       Height:       69.0 in Accession #:    [id number]      Weight:       300.0 lb Date of Birth:  [birth year]      BSA:          2.455 m Patient Age:    42 years        BP:           149/97 mmHg Patient Gender: M               HR:           74 bpm. Exam Location:  [facility] Procedure: 2D Echo, Cardiac Doppler, Color Doppler and Strain Analysis (Both            Spectral and Color Flow Doppler were utilized during procedure). Indications:      CHF  History:         Patient has no prior history of Echocardiogram examinations.                  CHF; Risk Factors:Hypertension, Sleep Apnea and Current Smoker.  Sonographer:     Mikki Harbor Referring Phys:  [id number] Gillis Santa Diagnosing Phys: Julien Nordmann MD  Sonographer Comments: Patient is obese, Technically difficult study due to poor echo windows, suboptimal parasternal window and no subcostal window. Global longitudinal strain was attempted. IMPRESSIONS  1. Left ventricular ejection fraction, by estimation, is 60 to 65%. The left ventricle has normal function. The left ventricle has no regional wall motion abnormalities. There is mild left ventricular hypertrophy. Left ventricular diastolic parameters were normal. The average left ventricular global longitudinal strain is -17.6 %. The global longitudinal strain is normal.  2. Right ventricular systolic function is normal. The right ventricular size is normal. Tricuspid regurgitation signal is inadequate for assessing PA pressure.  3. The mitral valve is normal in structure. Mild mitral valve regurgitation. No evidence of mitral stenosis.  4. The aortic valve is normal in structure. Aortic valve regurgitation is not visualized. No aortic stenosis is present.  5. There is borderline dilatation of the aortic root, measuring 39 mm.  6. The inferior vena cava is normal in size with greater than 50% respiratory variability, suggesting right atrial pressure of 3 mmHg. FINDINGS  Left Ventricle: Left ventricular ejection fraction, by estimation, is 60 to 65%. The left ventricle has normal function. The left ventricle has no regional wall motion abnormalities. The average left ventricular global longitudinal strain is -17.6 %. Strain was performed and the global longitudinal strain is normal. The left ventricular internal cavity size was normal in size. There is mild left ventricular hypertrophy. Left ventricular diastolic parameters were normal. Right  Ventricle: The right ventricular size is normal. No increase in right ventricular wall thickness. Right ventricular systolic function is normal. Tricuspid regurgitation signal is inadequate for assessing PA pressure. Left Atrium: Left atrial size was normal in size. Right Atrium: Right atrial size  was normal in size. Pericardium: There is no evidence of pericardial effusion. Mitral Valve: The mitral valve is normal in structure. Mild mitral valve regurgitation. No evidence of mitral valve stenosis. MV peak gradient, 4.0 mmHg. The mean mitral valve gradient is 1.0 mmHg. Tricuspid Valve: The tricuspid valve is normal in structure. Tricuspid valve regurgitation is not demonstrated. No evidence of tricuspid stenosis. Aortic Valve: The aortic valve is normal in structure. Aortic valve regurgitation is not visualized. No aortic stenosis is present. Aortic valve mean gradient measures 2.0 mmHg. Aortic valve peak gradient measures 4.2 mmHg. Aortic valve area, by VTI measures 3.19 cm. Pulmonic Valve: The pulmonic valve was normal in structure. Pulmonic valve regurgitation is not visualized. No evidence of pulmonic stenosis. Aorta: The aortic root is normal in size and structure. There is borderline dilatation of the aortic root, measuring 39 mm. Venous: The inferior vena cava is normal in size with greater than 50% respiratory variability, suggesting right atrial pressure of 3 mmHg. IAS/Shunts: No atrial level shunt detected by color flow Doppler. Additional Comments: 3D was performed not requiring image post processing on an independent workstation and was indeterminate.  LEFT VENTRICLE PLAX 2D LVIDd:         4.80 cm      Diastology LVIDs:         3.00 cm      LV e' medial:    10.70 cm/s LV PW:         1.30 cm      LV E/e' medial:  9.1 LV IVS:        1.35 cm      LV e' lateral:   12.00 cm/s LVOT diam:     2.20 cm      LV E/e' lateral: 8.1 LV SV:         69 LV SV Index:   28           2D Longitudinal Strain LVOT Area:      3.80 cm     2D Strain GLS Avg:     -17.6 %  LV Volumes (MOD) LV vol d, MOD A2C: 99.4 ml LV vol d, MOD A4C: 146.0 ml LV vol s, MOD A2C: 40.3 ml LV vol s, MOD A4C: 51.6 ml LV SV MOD A2C:     59.1 ml LV SV MOD A4C:     146.0 ml LV SV MOD BP:      78.8 ml RIGHT VENTRICLE RV Basal diam:  4.25 cm RV Mid diam:    3.80 cm RV S prime:     13.90 cm/s TAPSE (M-mode): 2.7 cm LEFT ATRIUM             Index        RIGHT ATRIUM           Index LA diam:        4.40 cm 1.79 cm/m   RA Area:     18.90 cm LA Vol (A2C):   73.7 ml 30.03 ml/m  RA Volume:   54.30 ml  22.12 ml/m LA Vol (A4C):   50.0 ml 20.37 ml/m LA Biplane Vol: 66.7 ml 27.17 ml/m  AORTIC VALVE                    PULMONIC VALVE AV Area (Vmax):    3.10 cm     PV Vmax:       1.00 m/s AV Area (Vmean):   2.96 cm     PV  Peak grad:  4.0 mmHg AV Area (VTI):     3.19 cm AV Vmax:           103.00 cm/s AV Vmean:          70.200 cm/s AV VTI:            0.217 m AV Peak Grad:      4.2 mmHg AV Mean Grad:      2.0 mmHg LVOT Vmax:         84.00 cm/s LVOT Vmean:        54.600 cm/s LVOT VTI:          0.182 m LVOT/AV VTI ratio: 0.84  AORTA Ao Root diam: 3.90 cm MITRAL VALVE MV Area (PHT): 3.39 cm    SHUNTS MV Area VTI:   1.93 cm    Systemic VTI:  0.18 m MV Peak grad:  4.0 mmHg    Systemic Diam: 2.20 cm MV Mean grad:  1.0 mmHg MV Vmax:       1.00 m/s MV Vmean:      52.3 cm/s MV Decel Time: 224 msec MV E velocity: 97.40 cm/s MV A velocity: 69.80 cm/s MV E/A ratio:  1.40 Julien Nordmann MD Electronically signed by Julien Nordmann MD Signature Date/Time: 03/09/2023/3:59:57 PM    Final    DG Chest Port 1 View Result Date: 03/07/2023 CLINICAL DATA:  Shortness of breath and wheezing EXAM: PORTABLE CHEST 1 VIEW COMPARISON:  Chest radiograph 02/22/2023 FINDINGS: The heart size and mediastinal contours are within normal limits. Both lungs are clear. The visualized skeletal structures are unremarkable. IMPRESSION: No active disease. Electronically Signed   By: Annia Belt M.D.   On: 03/07/2023  11:33   DG Chest Port 1 View Result Date: 02/22/2023 CLINICAL DATA:  Shortness of breath. EXAM: PORTABLE CHEST 1 VIEW COMPARISON:  Radiograph and CT yesterday FINDINGS: The cardiomediastinal contours are stable. Minor bibasilar atelectasis. Pulmonary vasculature is normal. No confluent consolidation, pleural effusion, or pneumothorax. No acute osseous abnormalities are seen. IMPRESSION: Minor bibasilar atelectasis. Electronically Signed   By: Narda Rutherford M.D.   On: 02/22/2023 18:19   DG Knee Complete 4 Views Right Result Date: 02/21/2023 CLINICAL DATA:  Knee pain.  Swelling. EXAM: RIGHT KNEE - COMPLETE 4+ VIEW COMPARISON:  05/08/2020. FINDINGS: No acute fracture or dislocation. No aggressive osseous lesion. There are degenerative changes of the knee joint in the form of reduced medial tibio-femoral compartment joint space and tricompartmental osteophytosis. No knee effusion or focal soft tissue swelling. No radiopaque foreign bodies. IMPRESSION: *No acute osseous abnormality of the right knee. Mild-to-moderate degenerative changes. Electronically Signed   By: Jules Schick M.D.   On: 02/21/2023 14:13   DG Knee Complete 4 Views Left Result Date: 02/21/2023 CLINICAL DATA:  Left knee pain.  Leg swelling. EXAM: LEFT KNEE - COMPLETE 4+ VIEW COMPARISON:  05/08/2020. FINDINGS: No acute fracture or dislocation. No aggressive osseous lesion. There are degenerative changes of the knee joint in the form of mildly reduced medial tibio-femoral compartment joint space, tibial spiking and tricompartmental osteophytosis. No knee effusion or focal soft tissue swelling. No radiopaque foreign bodies. IMPRESSION: *No acute osseous abnormality of the left knee. 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 Electronically Signed   By: Jules Schick M.D.   On: 02/21/2023 14:12   US Venous Img Lower Bilateral (DVT) Result Date: 02/21/2023 CLINICAL DATA:  [id number]  Leg swelling [id number] Patient c/o being short of breath and bilateral leg swelling times three  days. The swelling is more so around the knees EXAM: BILATERAL LOWER EXTREMITY VENOUS DOPPLER ULTRASOUND TECHNIQUE: Gray-scale sonography with graded compression, as well as color Doppler and duplex ultrasound were performed to evaluate the lower extremity deep venous systems from the level of the common femoral vein and including the common femoral, femoral, profunda femoral, popliteal and calf veins including the posterior tibial, peroneal and gastrocnemius veins when visible. The superficial great saphenous vein was also interrogated. Spectral Doppler was utilized to evaluate flow at rest and with distal augmentation maneuvers in the common femoral, femoral and popliteal veins. COMPARISON:  CTA PE, 02/21/2023. LEFT lower extremity venous duplex, 11/25/2019 FINDINGS: RIGHT LOWER EXTREMITY VENOUS Normal compressibility of the RIGHT common femoral, superficial femoral, and popliteal veins, as well as the visualized calf veins. Visualized portions of profunda femoral vein and great saphenous vein unremarkable. No filling defects to suggest DVT on grayscale or color Doppler imaging. Doppler waveforms show normal direction of venous flow, normal respiratory plasticity and response to augmentation. OTHER No evidence of superficial thrombophlebitis or abnormal fluid collection. Limitations: none LEFT LOWER EXTREMITY VENOUS Normal compressibility of the LEFT common femoral, superficial femoral, and popliteal veins, as well as the visualized calf veins. Visualized portions of profunda femoral vein and great saphenous vein unremarkable. No filling defects to suggest DVT on grayscale or color Doppler imaging. Doppler waveforms show normal direction of venous flow, normal respiratory plasticity and response to augmentation. OTHER No evidence of superficial thrombophlebitis or abnormal fluid collection. Limitations: none IMPRESSION: No  evidence of femoropopliteal DVT or superficial thrombophlebitis within either lower extremity. Roanna Banning, MD [facility] Electronically Signed   By: Roanna Banning M.D.   On: 02/21/2023 11:40   CT Angio Chest PE W/Cm &/Or Wo Cm Result Date: 02/21/2023 CLINICAL DATA:  Pulmonary embolism (PE) suspected, high prob. Shortness of breath. Bilateral leg swelling. EXAM: CT ANGIOGRAPHY CHEST WITH CONTRAST TECHNIQUE: Multidetector CT imaging of the chest was performed using the standard protocol during bolus administration of intravenous contrast. Multiplanar CT image reconstructions and MIPs were obtained to evaluate the vascular anatomy. RADIATION DOSE REDUCTION: This exam was performed according to the departmental dose-optimization program which includes automated exposure control, adjustment of the mA and/or kV according to patient size and/or use of iterative reconstruction technique. CONTRAST:  75mL OMNIPAQUE IOHEXOL 350 MG/ML SOLN COMPARISON:  CT angiography chest from 01/03/2023. FINDINGS: Cardiovascular: No evidence of embolism to the proximal subsegmental pulmonary artery level. Normal cardiac size. No pericardial effusion. No aortic aneurysm. 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 Mediastinum/Nodes: Visualized thyroid gland appears grossly unremarkable. No solid / cystic mediastinal masses. The esophagus is nondistended precluding optimal assessment. No axillary, mediastinal or hilar lymphadenopathy by size criteria. Lungs/Pleura: The central tracheo-bronchial tree is patent. There are dependent changes in the bilateral lungs. No mass or consolidation. No pleural effusion or pneumothorax. No suspicious lung nodules. Upper Abdomen: There is a 2.4 x 2.4 cm partially exophytic simple cyst in the left kidney upper pole. Remaining  visualized upper abdominal viscera within normal limits. Musculoskeletal: The visualized soft tissues of the chest wall are grossly unremarkable. No suspicious osseous lesions. There are mild multilevel degenerative changes in the visualized spine. Review of the MIP images confirms the above findings. IMPRESSION: 1. No embolism to the proximal subsegmental pulmonary artery level. 2. Multiple other nonacute observations, as described above. Electronically Signed  By: Jules Schick M.D.   On: 02/21/2023 11:19   DG Chest 2 View Result Date: 02/21/2023 CLINICAL DATA:  Chest pain and shortness of breath EXAM: CHEST - 2 VIEW COMPARISON:  Chest radiograph 02/02/2013 FINDINGS: The heart size and mediastinal contours are within normal limits. Both lungs are clear. The visualized skeletal structures are unremarkable. IMPRESSION: No active cardiopulmonary disease. Electronically Signed   By: Annia Belt M.D.   On: 02/21/2023 08:43    Microbiology: Recent Results (from the past 240 hours)  Resp panel by RT-PCR (RSV, Flu A&B, Covid) Anterior Nasal Swab     Status: None   Collection Time: 03/07/23 12:00 PM   Specimen: Anterior Nasal Swab  Result Value Ref Range Status   SARS Coronavirus 2 by RT PCR NEGATIVE NEGATIVE Final    Comment: (NOTE) SARS-CoV-2 target nucleic acids are NOT DETECTED.  The SARS-CoV-2 RNA is generally detectable in upper respiratory specimens during the acute phase of infection. The lowest concentration of SARS-CoV-2 viral copies this assay can detect is 138 copies/mL. A negative result does not preclude SARS-Cov-2 infection and should not be used as the sole basis for treatment or other patient management decisions. A negative result may occur with  improper specimen collection/handling, submission of specimen other than nasopharyngeal swab, presence of viral mutation(s) within the areas targeted by this assay, and inadequate number of viral copies(<138 copies/mL). A negative  result must be combined with clinical observations, patient history, and epidemiological information. The expected result is Negative.  Fact Sheet for Patients:  BloggerCourse.com  Fact Sheet for Healthcare Providers:  SeriousBroker.it  This test is no t yet approved or cleared by the [place] FDA and  has been authorized for detection and/or diagnosis of SARS-CoV-2 by FDA under an Emergency Use Authorization (EUA). This EUA will remain  in effect (meaning this test can be used) for the duration of the COVID-19 declaration under Section 564(b)(1) of the Act, 21 U.S.C.section 360bbb-3(b)(1), unless the authorization is terminated  or revoked sooner.       Influenza A by PCR NEGATIVE NEGATIVE Final   Influenza B by PCR NEGATIVE NEGATIVE Final    Comment: (NOTE) The Xpert Xpress SARS-CoV-2/FLU/RSV plus assay is intended as an aid in the diagnosis of influenza from Nasopharyngeal swab specimens and should not be used as a sole basis for treatment. Nasal washings and aspirates are unacceptable for Xpert Xpress SARS-CoV-2/FLU/RSV testing.  Fact Sheet for Patients: BloggerCourse.com  Fact Sheet for Healthcare Providers: SeriousBroker.it  This test is not yet approved or cleared by the [place] FDA and has been authorized for detection and/or diagnosis of SARS-CoV-2 by FDA under an Emergency Use Authorization (EUA). This EUA will remain in effect (meaning this test can be used) for the duration of the COVID-19 declaration under Section 564(b)(1) of the Act, 21 U.S.C. section 360bbb-3(b)(1), unless the authorization is terminated or revoked.     Resp Syncytial Virus by PCR NEGATIVE NEGATIVE Final    Comment: (NOTE) Fact Sheet for Patients: BloggerCourse.com  Fact Sheet for Healthcare Providers: SeriousBroker.it  This  test is not yet approved or cleared by the [place] FDA and has been authorized for detection and/or diagnosis of SARS-CoV-2 by FDA under an Emergency Use Authorization (EUA). This EUA will remain in effect (meaning this test can be used) for the duration of the COVID-19 declaration under Section 564(b)(1) of the Act, 21 U.S.C. section 360bbb-3(b)(1), unless the authorization is terminated or revoked.  Performed at [facility],  [address], [place], [place] [address]   Respiratory (~20 pathogens) panel by PCR     Status: None   Collection Time: 03/08/23  9:35 AM   Specimen: Nasopharyngeal Swab; Respiratory  Result Value Ref Range Status   Adenovirus NOT DETECTED NOT DETECTED Final   Coronavirus 229E NOT DETECTED NOT DETECTED Final    Comment: (NOTE) The Coronavirus on the Respiratory Panel, DOES NOT test for the novel  Coronavirus (2019 nCoV)    Coronavirus HKU1 NOT DETECTED NOT DETECTED Final   Coronavirus NL63 NOT DETECTED NOT DETECTED Final   Coronavirus OC43 NOT DETECTED NOT DETECTED Final   Metapneumovirus NOT DETECTED NOT DETECTED Final   Rhinovirus / Enterovirus NOT DETECTED NOT DETECTED Final   Influenza A NOT DETECTED NOT DETECTED Final   Influenza B NOT DETECTED NOT DETECTED Final   Parainfluenza Virus 1 NOT DETECTED NOT DETECTED Final   Parainfluenza Virus 2 NOT DETECTED NOT DETECTED Final   Parainfluenza Virus 3 NOT DETECTED NOT DETECTED Final   Parainfluenza Virus 4 NOT DETECTED NOT DETECTED Final   Respiratory Syncytial Virus NOT DETECTED NOT DETECTED Final   Bordetella pertussis NOT DETECTED NOT DETECTED Final   Bordetella Parapertussis NOT DETECTED NOT DETECTED Final   Chlamydophila pneumoniae NOT DETECTED NOT DETECTED Final   Mycoplasma pneumoniae NOT DETECTED NOT DETECTED Final    Comment: Performed at [facility], [address] [place], [place] [address]     Labs: CBC: Recent Labs  Lab 03/07/23 1000 03/08/23 0552  03/09/23 0448 03/10/23 0635  WBC 7.6 10.4 15.6* 15.5*  HGB 13.7 12.5* 12.4* 12.2*  HCT 43.0 38.4* 37.6* 38.3*  MCV 86.2 85.0 86.2 86.8  PLT 398 383 373 351   Basic Metabolic Panel: Recent Labs  Lab 03/07/23 1000 03/08/23 0552 03/09/23 0448 03/10/23 0635  NA 137 136 137 138  K 4.1 3.8 4.4 4.0  CL 102 102 107 105  CO2 25 24 23 26   GLUCOSE 109* 159* 204* 143*  BUN 13 18 16 17   CREATININE 0.87 1.11 1.02 0.93  CALCIUM 9.5 9.1 9.0 8.9  MG  --   --  2.4 2.2  PHOS  --   --  2.9 3.9   Liver Function Tests: No results for input(s): "AST", "ALT", "ALKPHOS", "BILITOT", "PROT", "ALBUMIN" in the last 168 hours. No results for input(s): "LIPASE", "AMYLASE" in the last 168 hours. No results for input(s): "AMMONIA" in the last 168 hours. Cardiac Enzymes: No results for input(s): "CKTOTAL", "CKMB", "CKMBINDEX", "TROPONINI" in the last 168 hours. BNP (last 3 results) Recent Labs    02/21/23 0822 02/22/23 1710 03/08/23 0552  BNP 19.6 32.2 13.3   CBG: Recent Labs  Lab 03/09/23 1645 03/09/23 2144 03/10/23 0747 03/10/23 1213  GLUCAP 146* 170* 131* 169*    Time spent: 35 minutes  Signed:  Gillis Santa  [facility] 03/10/2023 12:42 PM

## 2023-03-14 ENCOUNTER — Other Ambulatory Visit: Payer: Self-pay

## 2023-03-17 ENCOUNTER — Telehealth: Payer: Self-pay

## 2023-03-17 ENCOUNTER — Observation Stay
Admission: EM | Admit: 2023-03-17 | Discharge: 2023-03-18 | Disposition: A | Discharge status: Home / Self Care | Payer: Self-pay | Attending: Student | Admitting: Student

## 2023-03-17 ENCOUNTER — Emergency Department: Payer: Self-pay

## 2023-03-17 ENCOUNTER — Other Ambulatory Visit: Payer: Self-pay

## 2023-03-17 DIAGNOSIS — G4733 Obstructive sleep apnea (adult) (pediatric): Secondary | ICD-10-CM | POA: Diagnosis present

## 2023-03-17 DIAGNOSIS — J4541 Moderate persistent asthma with (acute) exacerbation: Secondary | ICD-10-CM | POA: Diagnosis not present

## 2023-03-17 DIAGNOSIS — R000 Tachycardia, unspecified: Secondary | ICD-10-CM | POA: Diagnosis not present

## 2023-03-17 DIAGNOSIS — Z79899 Other long term (current) drug therapy: Secondary | ICD-10-CM | POA: Insufficient documentation

## 2023-03-17 DIAGNOSIS — R0602 Shortness of breath: Secondary | ICD-10-CM | POA: Diagnosis not present

## 2023-03-17 DIAGNOSIS — F17210 Nicotine dependence, cigarettes, uncomplicated: Secondary | ICD-10-CM | POA: Insufficient documentation

## 2023-03-17 DIAGNOSIS — J4551 Severe persistent asthma with (acute) exacerbation: Secondary | ICD-10-CM

## 2023-03-17 DIAGNOSIS — F17200 Nicotine dependence, unspecified, uncomplicated: Secondary | ICD-10-CM | POA: Diagnosis present

## 2023-03-17 DIAGNOSIS — R059 Cough, unspecified: Secondary | ICD-10-CM | POA: Insufficient documentation

## 2023-03-17 DIAGNOSIS — I10 Essential (primary) hypertension: Secondary | ICD-10-CM | POA: Diagnosis present

## 2023-03-17 DIAGNOSIS — Z720 Tobacco use: Secondary | ICD-10-CM | POA: Diagnosis present

## 2023-03-17 DIAGNOSIS — E662 Morbid (severe) obesity with alveolar hypoventilation: Secondary | ICD-10-CM | POA: Diagnosis present

## 2023-03-17 DIAGNOSIS — Z7951 Long term (current) use of inhaled steroids: Secondary | ICD-10-CM | POA: Insufficient documentation

## 2023-03-17 DIAGNOSIS — J45901 Unspecified asthma with (acute) exacerbation: Secondary | ICD-10-CM | POA: Diagnosis present

## 2023-03-17 LAB — CBC WITH DIFFERENTIAL/PLATELET
Abs Immature Granulocytes: 0.02 10*3/uL (ref 0.00–0.07)
Basophils Absolute: 0.1 10*3/uL (ref 0.0–0.1)
Basophils Relative: 1 %
Eosinophils Absolute: 0.3 10*3/uL (ref 0.0–0.5)
Eosinophils Relative: 5 %
HCT: 41.6 % (ref 39.0–52.0)
Hemoglobin: 13.6 g/dL (ref 13.0–17.0)
Immature Granulocytes: 0 %
Lymphocytes Relative: 34 %
Lymphs Abs: 2.4 10*3/uL (ref 0.7–4.0)
MCH: 27.6 pg (ref 26.0–34.0)
MCHC: 32.7 g/dL (ref 30.0–36.0)
MCV: 84.4 fL (ref 80.0–100.0)
Monocytes Absolute: 0.4 10*3/uL (ref 0.1–1.0)
Monocytes Relative: 6 %
Neutro Abs: 3.9 10*3/uL (ref 1.7–7.7)
Neutrophils Relative %: 54 %
Platelets: 431 10*3/uL — ABNORMAL HIGH (ref 150–400)
RBC: 4.93 MIL/uL (ref 4.22–5.81)
RDW: 14.3 % (ref 11.5–15.5)
WBC: 7.1 10*3/uL (ref 4.0–10.5)
nRBC: 0 % (ref 0.0–0.2)

## 2023-03-17 LAB — COMPREHENSIVE METABOLIC PANEL
ALT: 24 U/L (ref 0–44)
AST: 19 U/L (ref 15–41)
Albumin: 4 g/dL (ref 3.5–5.0)
Alkaline Phosphatase: 57 U/L (ref 38–126)
Anion gap: 12 (ref 5–15)
BUN: 20 mg/dL (ref 6–20)
CO2: 24 mmol/L (ref 22–32)
Calcium: 8.8 mg/dL — ABNORMAL LOW (ref 8.9–10.3)
Chloride: 98 mmol/L (ref 98–111)
Creatinine, Ser: 0.96 mg/dL (ref 0.61–1.24)
GFR, Estimated: >60 mL/min (ref >60)
Glucose, Bld: 106 mg/dL — ABNORMAL HIGH (ref 70–99)
Potassium: 3.9 mmol/L (ref 3.5–5.1)
Sodium: 134 mmol/L — ABNORMAL LOW (ref 135–145)
Total Bilirubin: 0.7 mg/dL (ref 0.0–1.2)
Total Protein: 7.5 g/dL (ref 6.5–8.1)

## 2023-03-17 LAB — RESP PANEL BY RT-PCR (RSV, FLU A&B, COVID)  RVPGX2
Influenza A by PCR: NEGATIVE
Influenza B by PCR: NEGATIVE
Resp Syncytial Virus by PCR: NEGATIVE
SARS Coronavirus 2 by RT PCR: NEGATIVE

## 2023-03-17 LAB — RESPIRATORY PANEL BY PCR

## 2023-03-17 LAB — BRAIN NATRIURETIC PEPTIDE: B Natriuretic Peptide: 6.5 pg/mL (ref 0.0–100.0)

## 2023-03-17 MED ORDER — LORATADINE 10 MG PO TABS
10.0000 mg | ORAL_TABLET | Freq: Every day | ORAL | Status: DC
  Administered 2023-03-17 – 2023-03-18 (×2): 10 mg via ORAL
  Filled 2023-03-17 (×2): qty 1

## 2023-03-17 MED ORDER — AMLODIPINE BESYLATE 5 MG PO TABS
5.0000 mg | ORAL_TABLET | Freq: Every day | ORAL | Status: DC
  Administered 2023-03-18: 5 mg via ORAL
  Filled 2023-03-17: qty 1

## 2023-03-17 MED ORDER — ALBUTEROL SULFATE HFA 108 (90 BASE) MCG/ACT IN AERS
2.0000 | INHALATION_SPRAY | RESPIRATORY_TRACT | Status: DC | PRN
  Administered 2023-03-17: 2 via RESPIRATORY_TRACT
  Filled 2023-03-17: qty 6.7

## 2023-03-17 MED ORDER — METHYLPREDNISOLONE SODIUM SUCC 125 MG IJ SOLR
125.0000 mg | Freq: Once | INTRAMUSCULAR | Status: AC
  Administered 2023-03-17: 125 mg via INTRAVENOUS
  Filled 2023-03-17: qty 2

## 2023-03-17 MED ORDER — ORAL CARE MOUTH RINSE: 15.0000 mL | OROMUCOSAL | Status: DC | PRN

## 2023-03-17 MED ORDER — NICOTINE 14 MG/24HR TD PT24
14.0000 mg | MEDICATED_PATCH | Freq: Every day | TRANSDERMAL | Status: DC
  Administered 2023-03-17 – 2023-03-18 (×2): 14 mg via TRANSDERMAL
  Filled 2023-03-17 (×2): qty 1

## 2023-03-17 MED ORDER — IPRATROPIUM-ALBUTEROL 0.5-2.5 (3) MG/3ML IN SOLN
3.0000 mL | Freq: Once | RESPIRATORY_TRACT | Status: AC
  Administered 2023-03-17: 3 mL via RESPIRATORY_TRACT
  Filled 2023-03-17: qty 3

## 2023-03-17 MED ORDER — MONTELUKAST SODIUM 10 MG PO TABS
10.0000 mg | ORAL_TABLET | Freq: Every day | ORAL | Status: DC
  Administered 2023-03-17: 10 mg via ORAL
  Filled 2023-03-17: qty 1

## 2023-03-17 MED ORDER — SODIUM CHLORIDE 0.9% FLUSH
3.0000 mL | Freq: Two times a day (BID) | INTRAVENOUS | Status: DC
  Administered 2023-03-17 – 2023-03-18 (×2): 3 mL via INTRAVENOUS

## 2023-03-17 MED ORDER — PREDNISONE 20 MG PO TABS
40.0000 mg | ORAL_TABLET | Freq: Every day | ORAL | Status: DC
  Administered 2023-03-18: 40 mg via ORAL
  Filled 2023-03-17: qty 2

## 2023-03-17 MED ORDER — ACETAMINOPHEN 325 MG PO TABS
650.0000 mg | ORAL_TABLET | Freq: Four times a day (QID) | ORAL | Status: DC | PRN
  Administered 2023-03-17: 650 mg via ORAL
  Filled 2023-03-17: qty 2

## 2023-03-17 MED ORDER — ENOXAPARIN SODIUM 40 MG/0.4ML IJ SOSY
40.0000 mg | PREFILLED_SYRINGE | INTRAMUSCULAR | Status: DC
  Administered 2023-03-17: 40 mg via SUBCUTANEOUS
  Filled 2023-03-17: qty 0.4

## 2023-03-17 MED ORDER — BUDESONIDE 0.5 MG/2ML IN SUSP
0.5000 mg | Freq: Two times a day (BID) | RESPIRATORY_TRACT | Status: DC
  Administered 2023-03-17 – 2023-03-18 (×2): 0.5 mg via RESPIRATORY_TRACT
  Filled 2023-03-17 (×3): qty 2

## 2023-03-17 MED ORDER — IPRATROPIUM-ALBUTEROL 0.5-2.5 (3) MG/3ML IN SOLN
3.0000 mL | Freq: Four times a day (QID) | RESPIRATORY_TRACT | Status: DC
  Administered 2023-03-17 – 2023-03-18 (×3): 3 mL via RESPIRATORY_TRACT
  Filled 2023-03-17 (×4): qty 3

## 2023-03-17 NOTE — H&P (Addendum)
 History and Physical    Patient: Justin Reid DOB: Aug 01, 1980 DOA: 03/17/2023 DOS: the patient was seen and examined on 03/17/2023 Justin Reid: Justin Reid, No  Patient coming from: Home  Chief Complaint:  Chief Complaint  Patient presents with   Shortness of Breath   HPI: Justin Reid is a 43 y.o. male with medical history significant of asthma with multiple recent exacerbations, tobacco use disorder, hypertension, OSA/OHS on CPAP, who presents to the ED due to shortness of breath.  Justin Reid states that starting yesterday, he experienced rapidly progressive shortness of breath and nonproductive cough despite using his home bronchodilators.  He denies any fever, rhinorrhea, chest pain, palpitations.  He denies any abdominal symptoms including nausea, vomiting, diarrhea.  He endorses chronic lower extremity swelling that is unchanged.  He notes that he has only been able to wear CPAP when hospitalized and has not had the ability to order one for home.  ED course: On arrival to the ED, patient was hypertensive at 134/101 with heart rate of 109.  He was saturating at 96% on room air.  He was tachypneic at 22/minute.  He was afebrile at 98.2. Initial workup notable for unremarkable CBC and CMP.  BNP 6.5.  COVID-19, influenza and RSV PCR negative.  Chest x-ray with no active disease.  Patient started on Solu-Medrol, DuoNebs.  [facility] contacted for admission.  Review of Systems: As mentioned in the history of present illness. All other systems reviewed and are negative.  Past Medical History:  Diagnosis Date   2019-nCoV vaccination declined 11/10/2020   Arthritis    Asthma    Hypertension    Morbid obesity (HCC)    Shingles    Tobacco use    History reviewed. No pertinent surgical history. Social History:  reports that he has been smoking cigarettes. He has a 2.5 pack-year smoking history. He has never used smokeless tobacco. He reports current alcohol use. He reports that he does not  use drugs.  No Known Allergies  Family History  Problem Relation Age of Onset   Hypertension Father     Prior to Admission medications   Medication Sig Start Date End Date Taking? Authorizing Provider  albuterol (VENTOLIN HFA) 108 (90 Base) MCG/ACT inhaler Inhale 2 puffs into the lungs every 4 (four) hours as needed for wheezing or shortness of breath. 02/25/23   Sunnie Nielsen, DO  amLODipine (NORVASC) 5 MG tablet Take 1 tablet (5 mg total) by mouth daily. 02/25/23 05/26/23  Sunnie Nielsen, DO  Dextromethorphan-guaiFENesin 20-200 MG/20ML LIQD Take 10 mLs by mouth every 6 (six) hours as needed. 03/10/23   Gillis Santa, MD  fluticasone-salmeterol [facility] INHUB) 250-50 MCG/ACT AEPB Inhale 1 puff into the lungs in the morning and at bedtime. 02/25/23   Sunnie Nielsen, DO  guaiFENesin (MUCINEX) 600 MG 12 hr tablet Take 2 tablets (1,200 mg total) by mouth 2 (two) times daily as needed for cough or to loosen phlegm. 02/25/23   Sunnie Nielsen, DO  ipratropium-albuterol (DUONEB) 0.5-2.5 (3) MG/3ML SOLN Take 3 mLs by nebulization every 2 (two) hours as needed (wheezing, shortness of breath). 02/25/23   Sunnie Nielsen, DO  loratadine (CLARITIN) 10 MG tablet Take 1 tablet (10 mg total) by mouth daily. 02/26/23   Sunnie Nielsen, DO  metoprolol tartrate (LOPRESSOR) 50 MG tablet Take 1 tablet (50 mg total) by mouth 2 (two) times daily. 03/10/23 06/08/23  Gillis Santa, MD  montelukast (SINGULAIR) 10 MG tablet Take 1 tablet (10 mg total) by mouth daily.  02/25/23   Sunnie Nielsen, DO  nicotine (NICODERM CQ - DOSED IN MG/24 HOURS) 14 mg/24hr patch Place 1 patch (14 mg total) onto the skin daily as needed (nicotine craving). 02/25/23   Sunnie Nielsen, DO  pantoprazole (PROTONIX) 40 MG tablet Take 1 tablet (40 mg total) by mouth daily. 02/26/23   Sunnie Nielsen, DO    Physical Exam: Vitals:   03/17/23 1249 03/17/23 1709  BP: (!) 134/101 136/86  Pulse: (!) 109 62  Resp: (!) 22 (!) 22   Temp: 98.2 F (36.8 C) 99.4 F (37.4 C)  TempSrc: Oral Oral  SpO2: 96% 97%   Physical Exam Vitals and nursing note reviewed.  Constitutional:      Appearance: He is obese.  HENT:     Head: Normocephalic and atraumatic.  Eyes:     Extraocular Movements: Extraocular movements intact.     Pupils: Pupils are equal, round, and reactive to light.  Cardiovascular:     Rate and Rhythm: Normal rate and regular rhythm.  Pulmonary:     Effort: Tachypnea and accessory muscle usage present.     Breath sounds: Decreased breath sounds (Throughout) and wheezing (Expiratory wheezing on anterior exam) present. No rhonchi or rales.  Abdominal:     Palpations: Abdomen is soft.     Tenderness: There is no abdominal tenderness.  Musculoskeletal:     Right lower leg: No edema.     Left lower leg: No edema.  Skin:    General: Skin is warm and dry.  Neurological:     General: No focal deficit present.     Mental Status: He is alert and oriented to person, place, and time.  Psychiatric:        Mood and Affect: Mood normal.        Behavior: Behavior normal.    Data Reviewed: CBC with WBC of 7.1, hemoglobin of 13.6, and platelets of 431 CMP with sodium of 134, potassium 3.9, bicarb 24, glucose 106, BUN 20, creatinine 0.96, AST 90, ALT 24, and GFR above 60 BNP 6.5 COVID-19, influenza and RSV PCR negative  EKG personally reviewed.  Sinus rhythm with rate of 109.  Multiple PACs.  DG Chest 2 View Result Date: 03/17/2023 CLINICAL DATA:  Shortness of breath worsening over the last few days. EXAM: CHEST - 2 VIEW COMPARISON:  AP chest 03/07/2023 FINDINGS: Cardiac silhouette and mediastinal contours are within normal limits. The lungs are clear. No pleural effusion or pneumothorax. Mild multilevel degenerative disc changes of the mid to lower thoracic spine. IMPRESSION: No active cardiopulmonary disease. Electronically Signed   By: Neita Garnet M.D.   On: 03/17/2023 16:10   Results are pending, will  review when available.  Assessment and Plan:  * Acute asthma exacerbation Justin Reid is presenting with approximately 1-2-day history of increased shortness of breath and nonproductive cough concerning for asthma exacerbation.  He was recently admitted for the same from 3/3 till 3/6 at which time workup was negative for infectious etiology.  He notes a lifelong history of asthma, but notes it has become quite severe in the last few months.  He will need to establish with outpatient pulmonology and work on quitting smoking.  He has been compliant with bronchodilators since most recent discharge.  - Consulted with pulmonology given frequency of exacerbations; discussed with Dr. Park Breed.  Appreciate his recommendations - Continuous pulse oximetry - S/p Solu-Medrol 125 mg once - Start prednisone 40 mg tomorrow. May ultimately benefit from prolonged taper - RVP pending -  DuoNebs every 6 hours - Pulmicort BID - Hold home bronchodilators  OSA (obstructive sleep apnea) - AM VBG to help facilitate outpatient CPAP - CPAP at bedtime  Obesity hypoventilation syndrome (HCC) Per chart review, there has been a clinical documentation of obesity hypoventilation syndrome, for which patient's body habitus certainly places him at risk.  No evidence of daytime hypercapnia documented though.  Bicarb within normal limits.  While patient is certainly at high risk, this diagnosis has not been established.  Tobacco abuse Patient is aware of the importance of smoking cessation.  He has been utilizing a patch at home.  HTN (hypertension) - Resume home Amlodipine - Hold home metoprolol.  If additional antihypertensive therapy is needed, would avoid beta-blocker until patient's asthma is stabilized  Advance Care Planning:   Code Status: Full Code   Consults: Pulmonology  Family Communication: No family at bedside  Severity of Illness: The appropriate patient status for this patient is OBSERVATION. Observation  status is judged to be reasonable and necessary in order to provide the required intensity of service to ensure the patient's safety. The patient's presenting symptoms, physical exam findings, and initial radiographic and laboratory data in the context of their medical condition is felt to place them at decreased risk for further clinical deterioration. Furthermore, it is anticipated that the patient will be medically stable for discharge from the hospital within 2 midnights of admission.   Author: Verdene Lennert, MD 03/17/2023 6:03 PM  For on call review [email or web address].

## 2023-03-17 NOTE — Assessment & Plan Note (Addendum)
 Per chart review, there has been a clinical documentation of obesity hypoventilation syndrome, for which patient's body habitus certainly places him at risk.  No evidence of daytime hypercapnia documented though.  Bicarb within normal limits.  While patient is certainly at high risk, this diagnosis has not been established.

## 2023-03-17 NOTE — Assessment & Plan Note (Addendum)
 Mr. Amer is presenting with approximately 1-2-day history of increased shortness of breath and nonproductive cough concerning for asthma exacerbation.  He was recently admitted for the same from 3/3 till 3/6 at which time workup was negative for infectious etiology.  He notes a lifelong history of asthma, but notes it has become quite severe in the last few months.  He will need to establish with outpatient pulmonology and work on quitting smoking.  He has been compliant with bronchodilators since most recent discharge.  - Consulted with pulmonology given frequency of exacerbations; discussed with Dr. Park Breed.  Appreciate his recommendations - Continuous pulse oximetry - S/p Solu-Medrol 125 mg once - Start prednisone 40 mg tomorrow. May ultimately benefit from prolonged taper - RVP pending - DuoNebs every 6 hours - Pulmicort BID - Hold home bronchodilators

## 2023-03-17 NOTE — ED Provider Notes (Signed)
 [facility] Emergency Department Provider Note     Event Date/Time   First MD Initiated Contact with Patient 03/17/23 1508     (approximate)   History   Shortness of Breath   HPI  Justin Reid is a 43 y.o. male history of asthma, hypertension, arthritis, obesity, OSA, and tobacco use disorder, presents to the ED endorsing increased shortness of breath.  Patient endorsed a few days of intermittent worsening symptoms.  He presents to the ED noting audible wheeze.  No fevers, chills, or sweats.  No frank chest pain is reported.  Physical Exam   Triage Vital Signs: ED Triage Vitals  Encounter Vitals Group     BP 03/17/23 1249 (!) 134/101     Systolic BP Percentile --      Diastolic BP Percentile --      Pulse Rate 03/17/23 1249 (!) 109     Resp 03/17/23 1249 (!) 22     Temp 03/17/23 1249 98.2 F (36.8 C)     Temp Source 03/17/23 1249 Oral     SpO2 03/17/23 1249 96 %     Weight --      Height --      Head Circumference --      Peak Flow --      Pain Score 03/17/23 1247 0     Pain Loc --      Pain Education --      Exclude from Growth Chart --     Most recent vital signs: Vitals:   03/17/23 1249 03/17/23 1709  BP: (!) 134/101 136/86  Pulse: (!) 109 62  Resp: (!) 22 (!) 22  Temp: 98.2 F (36.8 C) 99.4 F (37.4 C)  SpO2: 96% 97%    General Awake, no distress. NAD. Patient able to converse comfortably. HEENT NCAT. PERRL. EOMI. No rhinorrhea. Mucous membranes are moist.  CV:  Good peripheral perfusion. RRR. No MRG RESP:  Normal effort. Moderate wheeze and rhonchi noted bilaterally ABD:  No distention. Soft, nontender   ED Results / Procedures / Treatments   Labs (all labs ordered are listed, but only abnormal results are displayed) Labs Reviewed  COMPREHENSIVE METABOLIC PANEL - Abnormal; Notable for the following components:      Result Value   Sodium 134 (*)    Glucose, Bld 106 (*)    Calcium 8.8 (*)    All other components  within normal limits  CBC WITH DIFFERENTIAL/PLATELET - Abnormal; Notable for the following components:   Platelets 431 (*)    All other components within normal limits  RESP PANEL BY RT-PCR (RSV, FLU A&B, COVID)  RVPGX2  RESPIRATORY PANEL BY PCR  BRAIN NATRIURETIC PEPTIDE  BLOOD GAS, VENOUS  BASIC METABOLIC PANEL   EKG  Vent. rate 109 BPM PR interval 138 ms QRS duration 90 ms QT/QTcB 320/430 ms P-R-T axes 62 -14 71 Sinus tachycardia Nonspecific T wave abnormality Abnormal ECG No STEMI  RADIOLOGY  I personally viewed and evaluated these images as part of my medical decision making, as well as reviewing the written report by the radiologist.  ED Provider Interpretation: No acute findings  DG Chest 2 View Result Date: 03/17/2023 CLINICAL DATA:  Shortness of breath worsening over the last few days. EXAM: CHEST - 2 VIEW COMPARISON:  AP chest 03/07/2023 FINDINGS: Cardiac silhouette and mediastinal contours are within normal limits. The lungs are clear. No pleural effusion or pneumothorax. Mild multilevel degenerative disc changes of the mid to lower  thoracic spine. IMPRESSION: No active cardiopulmonary disease. Electronically Signed   By: Neita Garnet M.D.   On: 03/17/2023 16:10    PROCEDURES:  Critical Care performed: No  Procedures   MEDICATIONS ORDERED IN ED: Medications  albuterol (VENTOLIN HFA) 108 (90 Base) MCG/ACT inhaler 2 puff (has no administration in time range)  enoxaparin (LOVENOX) injection 40 mg (has no administration in time range)  nicotine (NICODERM CQ - dosed in mg/24 hours) patch 14 mg (has no administration in time range)  sodium chloride flush (NS) 0.9 % injection 3 mL (has no administration in time range)  predniSONE (DELTASONE) tablet 40 mg (has no administration in time range)  ipratropium-albuterol (DUONEB) 0.5-2.5 (3) MG/3ML nebulizer solution 3 mL (has no administration in time range)  budesonide (PULMICORT) nebulizer solution 0.5 mg (has no  administration in time range)  ipratropium-albuterol (DUONEB) 0.5-2.5 (3) MG/3ML nebulizer solution 3 mL (3 mLs Nebulization Given 03/17/23 1517)  ipratropium-albuterol (DUONEB) 0.5-2.5 (3) MG/3ML nebulizer solution 3 mL (3 mLs Nebulization Given 03/17/23 1552)  methylPREDNISolone sodium succinate (SOLU-MEDROL) 125 mg/2 mL injection 125 mg (125 mg Intravenous Given 03/17/23 1702)  ipratropium-albuterol (DUONEB) 0.5-2.5 (3) MG/3ML nebulizer solution 3 mL (3 mLs Nebulization Given 03/17/23 1702)  ipratropium-albuterol (DUONEB) 0.5-2.5 (3) MG/3ML nebulizer solution 3 mL (3 mLs Nebulization Given 03/17/23 1710)     IMPRESSION / MDM / ASSESSMENT AND PLAN / ED COURSE  I reviewed the triage vital signs and the nursing notes.                              Differential diagnosis includes, but is not limited to, viral syndrome, bronchitis including COPD exacerbation, pneumonia, reactive airway disease including asthma, CHF including exacerbation with or without pulmonary/interstitial edema, pneumothorax, ACS, thoracic trauma, and pulmonary embolism.   Patient's presentation is most consistent with acute presentation with potential threat to life or bodily function.  Patient's diagnosis is consistent with asthma exacerbation with a history of moderate persistent asthma.  Patient presents in no acute respiratory distress, but endorses increased shortness of breath and work of breathing over the last few days.  He presents noting his home meds have not been beneficial in changing his symptomology.  Exam is reassuring patient with reassuring chest x-ray and labs.  Viral panel is negative at this time.  He denies any significant benefit after 3 nebulizer treatments.    ----------------------------------------- 4:56 PM on 03/17/2023 ----------------------------------------- Spoke with Dr. Huel Cote: She will evaluate the patient in the ED for admission versus observation status.  Patient will be admitted to the  hospital service for observation as discussed.  Patient is agreeable to plan at this time.   FINAL CLINICAL IMPRESSION(S) / ED DIAGNOSES   Final diagnoses:  Moderate persistent asthma with exacerbation     Rx / DC Orders   ED Discharge Orders     None        Note:  This document was prepared using Dragon voice recognition software and may include unintentional dictation errors.    Lissa Hoard, PA-C 03/17/23 1746    Trinna Post, MD 03/17/23 [phone]

## 2023-03-17 NOTE — ED Triage Notes (Signed)
 Patient states increased shortness of breath that has worsened over the last few days. Audible wheezing at time of triage.

## 2023-03-17 NOTE — Assessment & Plan Note (Addendum)
-   AM VBG to help facilitate outpatient CPAP - CPAP at bedtime

## 2023-03-17 NOTE — Assessment & Plan Note (Addendum)
 Patient is aware of the importance of smoking cessation.  He has been utilizing a patch at home.

## 2023-03-17 NOTE — Assessment & Plan Note (Addendum)
-   Resume home Amlodipine - Hold home metoprolol.  If additional antihypertensive therapy is needed, would avoid beta-blocker until patient's asthma is stabilized

## 2023-03-18 LAB — BASIC METABOLIC PANEL
Anion gap: 9 (ref 5–15)
BUN: 14 mg/dL (ref 6–20)
CO2: 26 mmol/L (ref 22–32)
Calcium: 8.8 mg/dL — ABNORMAL LOW (ref 8.9–10.3)
Chloride: 100 mmol/L (ref 98–111)
Creatinine, Ser: 0.88 mg/dL (ref 0.61–1.24)
GFR, Estimated: >60 mL/min (ref >60)
Glucose, Bld: 162 mg/dL — ABNORMAL HIGH (ref 70–99)
Potassium: 4.6 mmol/L (ref 3.5–5.1)
Sodium: 135 mmol/L (ref 135–145)

## 2023-03-18 LAB — BLOOD GAS, VENOUS
Acid-Base Excess: 2.2 mmol/L — ABNORMAL HIGH (ref 0.0–2.0)
Bicarbonate: 29.1 mmol/L — ABNORMAL HIGH (ref 20.0–28.0)
O2 Saturation: 77.3 %
Patient temperature: 37
pCO2, Ven: 54 mmHg (ref 44–60)
pH, Ven: 7.34 (ref 7.25–7.43)
pO2, Ven: 47 mmHg — ABNORMAL HIGH (ref 32–45)

## 2023-03-18 LAB — PHOSPHORUS: Phosphorus: 3.2 mg/dL (ref 2.5–4.6)

## 2023-03-18 LAB — MAGNESIUM: Magnesium: 2.4 mg/dL (ref 1.7–2.4)

## 2023-03-18 MED ORDER — GUAIFENESIN ER 600 MG PO TB12: 600.0000 mg | ORAL_TABLET | Freq: Two times a day (BID) | ORAL | 0 refills | Status: AC

## 2023-03-18 MED ORDER — FLUTICASONE-SALMETEROL 250-50 MCG/ACT IN AEPB: 1.0000 | INHALATION_SPRAY | Freq: Two times a day (BID) | RESPIRATORY_TRACT | 0 refills | Status: DC

## 2023-03-18 MED ORDER — HYDROCOD POLI-CHLORPHE POLI ER 10-8 MG/5ML PO SUER: 5.0000 mL | Freq: Two times a day (BID) | ORAL | Status: DC | PRN

## 2023-03-18 MED ORDER — DEXTROMETHORPHAN-GUAIFENESIN 20-200 MG/20ML PO LIQD: 10.0000 mL | Freq: Four times a day (QID) | ORAL | 0 refills | Status: DC | PRN

## 2023-03-18 MED ORDER — ARFORMOTEROL TARTRATE 15 MCG/2ML IN NEBU
15.0000 ug | INHALATION_SOLUTION | Freq: Two times a day (BID) | RESPIRATORY_TRACT | Status: DC
  Administered 2023-03-18: 15 ug via RESPIRATORY_TRACT
  Filled 2023-03-18 (×2): qty 2

## 2023-03-18 MED ORDER — ALUM & MAG HYDROXIDE-SIMETH 200-200-20 MG/5ML PO SUSP
30.0000 mL | Freq: Four times a day (QID) | ORAL | Status: DC | PRN
  Administered 2023-03-18: 30 mL via ORAL
  Filled 2023-03-18 (×2): qty 30

## 2023-03-18 MED ORDER — GUAIFENESIN ER 600 MG PO TB12
600.0000 mg | ORAL_TABLET | Freq: Two times a day (BID) | ORAL | Status: DC
  Administered 2023-03-18: 600 mg via ORAL
  Filled 2023-03-18: qty 1

## 2023-03-18 NOTE — Progress Notes (Signed)
 I came to see the patient it appears he was discharged at around 1300h today. Patient can follow up in the office as discussed with hospitalist

## 2023-03-18 NOTE — Plan of Care (Signed)
 Patient alert and oriented. No complaints voiced, no distress noted. Patient did not wish to wear CPAP this evening. Respiratory status monitored closely. Wife at bedside. Will continue to monitor.   Problem: Clinical Measurements: Goal: Ability to maintain clinical measurements within normal limits will improve Outcome: Progressing   Problem: Activity: Goal: Risk for activity intolerance will decrease Outcome: Progressing   Problem: Respiratory: Goal: Levels of oxygenation will improve Outcome: Progressing Goal: Ability to maintain adequate ventilation will improve Outcome: Progressing

## 2023-03-18 NOTE — Discharge Summary (Signed)
 [facility] Discharge Summary   Patient: Justin Reid WGN:[id number]  PCP: Pcp, No  Date of admission: 03/17/2023   Date of discharge:  03/18/2023     Discharge Diagnoses:  Principal Problem:   Acute asthma exacerbation Active Problems:   OSA (obstructive sleep apnea)   Obesity hypoventilation syndrome (HCC)   Tobacco abuse   HTN (hypertension)   Admitted From: Home Disposition:  Home   Recommendations for Outpatient Follow-up:  PCP: in 1 wk Follow up LABS/TEST:     Follow-up Information     PCP Follow up in 1 week(s).                 Diet recommendation: Cardiac diet  Activity: The patient is advised to gradually reintroduce usual activities, as tolerated  Discharge Condition: stable  Code Status: Full code   History of present illness: As per the H and P dictated on admission  Hospital Course:  Justin Reid is a 43 y.o. male with medical history significant of asthma with multiple recent exacerbations, tobacco use disorder, hypertension, OSA/OHS on CPAP, who presents to the ED due to shortness of breath.   [name] states that starting yesterday, he experienced rapidly progressive shortness of breath and nonproductive cough despite using his home bronchodilators.  He denies any fever, rhinorrhea, chest pain, palpitations.  He denies any abdominal symptoms including nausea, vomiting, diarrhea.  He endorses chronic lower extremity swelling that is unchanged.   He notes that he has only been able to wear CPAP when hospitalized and has not had the ability to order one for home.   ED course: On arrival to the ED, patient was hypertensive at 134/101 with heart rate of 109.  He was saturating at 96% on room air.  He was tachypneic at 22/minute.  He was afebrile at 98.2. Initial workup notable for unremarkable CBC and CMP.  BNP 6.5.  COVID-19, influenza and RSV PCR negative.  Chest x-ray with no active disease.  Patient started on Solu-Medrol, DuoNebs.  [facility]  contacted for admission.   Assessment and Plan:   # Acute asthma exacerbation, most likely due to continuous smoking cigarettes S/p IV Solu-Medrol, DuoNeb and Brovana, Pulmicort nebulizer treatment given Patient's breathing improved, saturating well on room air.  Resumed home inhalers.  Smoking cessation counseling done.  Discharged home to follow-up with PCP in 1 week # Hypertension, uncontrolled most likely due to noncompliance Resumed home medications, patient was advised to monitor BP at home and follow with PCP # Morbid obesity, OSA, hypoventilation syndrome Calorie restricted diet and daily exercise advised to lose body weight.  Lifestyle modification discussed. # Nicotine dependence, Tobacco abuse Patient is aware of the importance of smoking cessation.  He has been utilizing a patch at home.  Patient was ambulatory without any assistance. On the day of the discharge the patient's vitals were stable, and no other acute medical condition were reported by patient. the patient was felt safe to be discharge at Home  Consultants: None Procedures: None  Discharge Exam: General: Appear in no distress, no Rash; Oral Mucosa Clear, moist. Cardiovascular: S1 and S2 Present, no Murmur, Respiratory: normal respiratory effort, Bilateral Air entry present and no Crackles, no wheezes Abdomen: Bowel Sound present, Soft and no tenderness, no hernia Extremities: no Pedal edema, no calf tenderness Neurology: alert and oriented to time, place, and person affect appropriate.  There were no vitals filed for this visit. Vitals:   03/18/23 0754 03/18/23 1026  BP: (!) 155/97  Pulse: 85   Resp: 19   Temp: (!) 97.2 F (36.2 C)   SpO2: 97% 97%    DISCHARGE MEDICATION: Allergies as of 03/18/2023   No Known Allergies      Medication List     TAKE these medications    albuterol 108 (90 Base) MCG/ACT inhaler Commonly known as: VENTOLIN HFA Inhale 2 puffs into the lungs every 4 (four) hours  as needed for wheezing or shortness of breath.   amLODipine 5 MG tablet Commonly known as: NORVASC Take 1 tablet (5 mg total) by mouth daily.   Dextromethorphan-guaiFENesin 20-200 MG/20ML Liqd Take 10 mLs by mouth every 6 (six) hours as needed.   fluticasone-salmeterol 250-50 MCG/ACT Aepb Commonly known as: Wixela Inhub Inhale 1 puff into the lungs in the morning and at bedtime.   guaiFENesin 600 MG 12 hr tablet Commonly known as: MUCINEX Take 1 tablet (600 mg total) by mouth 2 (two) times daily for 5 days. What changed:  how much to take when to take this reasons to take this   ipratropium-albuterol 0.5-2.5 (3) MG/3ML Soln Commonly known as: DUONEB Take 3 mLs by nebulization every 2 (two) hours as needed (wheezing, shortness of breath).   loratadine 10 MG tablet Commonly known as: CLARITIN Take 1 tablet (10 mg total) by mouth daily.   metoprolol tartrate 50 MG tablet Commonly known as: LOPRESSOR Take 1 tablet (50 mg total) by mouth 2 (two) times daily.   montelukast 10 MG tablet Commonly known as: SINGULAIR Take 1 tablet (10 mg total) by mouth daily.   nicotine 14 mg/24hr patch Commonly known as: NICODERM CQ - dosed in mg/24 hours Place 1 patch (14 mg total) onto the skin daily as needed (nicotine craving).   pantoprazole 40 MG tablet Commonly known as: PROTONIX Take 1 tablet (40 mg total) by mouth daily.       No Known Allergies Discharge Instructions     Call MD for:  difficulty breathing, headache or visual disturbances   Complete by: As directed    Call MD for:  extreme fatigue   Complete by: As directed    Call MD for:  persistant dizziness or light-headedness   Complete by: As directed    Call MD for:  persistant nausea and vomiting   Complete by: As directed    Call MD for:  severe uncontrolled pain   Complete by: As directed    Call MD for:  temperature >100.4   Complete by: As directed    Diet - low sodium heart healthy   Complete by: As  directed    Discharge instructions   Complete by: As directed    F/u PCP in 1 wk   Increase activity slowly   Complete by: As directed        The results of significant diagnostics from this hospitalization (including imaging, microbiology, ancillary and laboratory) are listed below for reference.    Significant Diagnostic Studies: DG Chest 2 View Result Date: 03/17/2023 CLINICAL DATA:  Shortness of breath worsening over the last few days. EXAM: CHEST - 2 VIEW COMPARISON:  AP chest 03/07/2023 FINDINGS: Cardiac silhouette and mediastinal contours are within normal limits. The lungs are clear. No pleural effusion or pneumothorax. Mild multilevel degenerative disc changes of the mid to lower thoracic spine. IMPRESSION: No active cardiopulmonary disease. Electronically Signed   By: Neita Garnet M.D.   On: 03/17/2023 16:10   ECHOCARDIOGRAM COMPLETE Result Date: 03/09/2023    ECHOCARDIOGRAM REPORT   Patient  Name:   Justin Reid Date of Exam: 03/09/2023 Medical Rec #:  [id number]       Height:       69.0 in Accession #:    [id number]      Weight:       300.0 lb Date of Birth:  March 31, 1980      BSA:          2.455 m Patient Age:    42 years        BP:           149/97 mmHg Patient Gender: M               HR:           74 bpm. Exam Location:  [facility] Procedure: 2D Echo, Cardiac Doppler, Color Doppler and Strain Analysis (Both            Spectral and Color Flow Doppler were utilized during procedure). Indications:     CHF  History:         Patient has no prior history of Echocardiogram examinations.                  CHF; Risk Factors:Hypertension, Sleep Apnea and Current Smoker.  Sonographer:     Mikki Harbor Referring Phys:  [id number] Gillis Santa Diagnosing Phys: Julien Nordmann MD  Sonographer Comments: Patient is obese, Technically difficult study due to poor echo windows, suboptimal parasternal window and no subcostal window. Global longitudinal strain was attempted. IMPRESSIONS  1. Left ventricular  ejection fraction, by estimation, is 60 to 65%. The left ventricle has normal function. The left ventricle has no regional wall motion abnormalities. There is mild left ventricular hypertrophy. Left ventricular diastolic parameters were normal. The average left ventricular global longitudinal strain is -17.6 %. The global longitudinal strain is normal.  2. Right ventricular systolic function is normal. The right ventricular size is normal. Tricuspid regurgitation signal is inadequate for assessing PA pressure.  3. The mitral valve is normal in structure. Mild mitral valve regurgitation. No evidence of mitral stenosis.  4. The aortic valve is normal in structure. Aortic valve regurgitation is not visualized. No aortic stenosis is present.  5. There is borderline dilatation of the aortic root, measuring 39 mm.  6. The inferior vena cava is normal in size with greater than 50% respiratory variability, suggesting right atrial pressure of 3 mmHg. FINDINGS  Left Ventricle: Left ventricular ejection fraction, by estimation, is 60 to 65%. The left ventricle has normal function. The left ventricle has no regional wall motion abnormalities. The average left ventricular global longitudinal strain is -17.6 %. Strain was performed and the global longitudinal strain is normal. The left ventricular internal cavity size was normal in size. There is mild left ventricular hypertrophy. Left ventricular diastolic parameters were normal. Right Ventricle: The right ventricular size is normal. No increase in right ventricular wall thickness. Right ventricular systolic function is normal. Tricuspid regurgitation signal is inadequate for assessing PA pressure. Left Atrium: Left atrial size was normal in size. Right Atrium: Right atrial size was normal in size. Pericardium: There is no evidence of pericardial effusion. Mitral Valve: The mitral valve is normal in structure. Mild mitral valve regurgitation. No evidence of mitral valve  stenosis. MV peak gradient, 4.0 mmHg. The mean mitral valve gradient is 1.0 mmHg. Tricuspid Valve: The tricuspid valve is normal in structure. Tricuspid valve regurgitation is not demonstrated. No evidence of tricuspid stenosis. Aortic Valve: The aortic valve is normal in  structure. Aortic valve regurgitation is not visualized. No aortic stenosis is present. Aortic valve mean gradient measures 2.0 mmHg. Aortic valve peak gradient measures 4.2 mmHg. Aortic valve area, by VTI measures 3.19 cm. Pulmonic Valve: The pulmonic valve was normal in structure. Pulmonic valve regurgitation is not visualized. No evidence of pulmonic stenosis. Aorta: The aortic root is normal in size and structure. There is borderline dilatation of the aortic root, measuring 39 mm. Venous: The inferior vena cava is normal in size with greater than 50% respiratory variability, suggesting right atrial pressure of 3 mmHg. IAS/Shunts: No atrial level shunt detected by color flow Doppler. Additional Comments: 3D was performed not requiring image post processing on an independent workstation and was indeterminate.  LEFT VENTRICLE PLAX 2D LVIDd:         4.80 cm      Diastology LVIDs:         3.00 cm      LV e' medial:    10.70 cm/s LV PW:         1.30 cm      LV E/e' medial:  9.1 LV IVS:        1.35 cm      LV e' lateral:   12.00 cm/s LVOT diam:     2.20 cm      LV E/e' lateral: 8.1 LV SV:         69 LV SV Index:   28           2D Longitudinal Strain LVOT Area:     3.80 cm     2D Strain GLS Avg:     -17.6 %  LV Volumes (MOD) LV vol d, MOD A2C: 99.4 ml LV vol d, MOD A4C: 146.0 ml LV vol s, MOD A2C: 40.3 ml LV vol s, MOD A4C: 51.6 ml LV SV MOD A2C:     59.1 ml LV SV MOD A4C:     146.0 ml LV SV MOD BP:      78.8 ml RIGHT VENTRICLE RV Basal diam:  4.25 cm RV Mid diam:    3.80 cm RV S prime:     13.90 cm/s TAPSE (M-mode): 2.7 cm LEFT ATRIUM             Index        RIGHT ATRIUM           Index LA diam:        4.40 cm 1.79 cm/m   RA Area:     18.90 cm LA  Vol (A2C):   73.7 ml 30.03 ml/m  RA Volume:   54.30 ml  22.12 ml/m LA Vol (A4C):   50.0 ml 20.37 ml/m LA Biplane Vol: 66.7 ml 27.17 ml/m  AORTIC VALVE                    PULMONIC VALVE AV Area (Vmax):    3.10 cm     PV Vmax:       1.00 m/s AV Area (Vmean):   2.96 cm     PV Peak grad:  4.0 mmHg AV Area (VTI):     3.19 cm AV Vmax:           103.00 cm/s AV Vmean:          70.200 cm/s AV VTI:            0.217 m AV Peak Grad:      4.2 mmHg AV Mean Grad:  2.0 mmHg LVOT Vmax:         84.00 cm/s LVOT Vmean:        54.600 cm/s LVOT VTI:          0.182 m LVOT/AV VTI ratio: 0.84  AORTA Ao Root diam: 3.90 cm MITRAL VALVE MV Area (PHT): 3.39 cm    SHUNTS MV Area VTI:   1.93 cm    Systemic VTI:  0.18 m MV Peak grad:  4.0 mmHg    Systemic Diam: 2.20 cm MV Mean grad:  1.0 mmHg MV Vmax:       1.00 m/s MV Vmean:      52.3 cm/s MV Decel Time: 224 msec MV E velocity: 97.40 cm/s MV A velocity: 69.80 cm/s MV E/A ratio:  1.40 Julien Nordmann MD Electronically signed by Julien Nordmann MD Signature Date/Time: 03/09/2023/3:59:57 PM    Final    DG Chest Port 1 View Result Date: 03/07/2023 CLINICAL DATA:  Shortness of breath and wheezing EXAM: PORTABLE CHEST 1 VIEW COMPARISON:  Chest radiograph 02/22/2023 FINDINGS: The heart size and mediastinal contours are within normal limits. Both lungs are clear. The visualized skeletal structures are unremarkable. IMPRESSION: No active disease. Electronically Signed   By: Annia Belt M.D.   On: 03/07/2023 11:33   DG Chest Port 1 View Result Date: 02/22/2023 CLINICAL DATA:  Shortness of breath. EXAM: PORTABLE CHEST 1 VIEW COMPARISON:  Radiograph and CT yesterday FINDINGS: The cardiomediastinal contours are stable. Minor bibasilar atelectasis. Pulmonary vasculature is normal. No confluent consolidation, pleural effusion, or pneumothorax. No acute osseous abnormalities are seen. IMPRESSION: Minor bibasilar atelectasis. Electronically Signed   By: Narda Rutherford M.D.   On: 02/22/2023 18:19    DG Knee Complete 4 Views Right Result Date: 02/21/2023 CLINICAL DATA:  Knee pain.  Swelling. EXAM: RIGHT KNEE - COMPLETE 4+ VIEW COMPARISON:  05/08/2020. FINDINGS: No acute fracture or dislocation. No aggressive osseous lesion. There are degenerative changes of the knee joint in the form of reduced medial tibio-femoral compartment joint space and tricompartmental osteophytosis. No knee effusion or focal soft tissue swelling. No radiopaque foreign bodies. IMPRESSION: *No acute osseous abnormality of the right knee. Mild-to-moderate degenerative changes. Electronically Signed   By: Jules Schick M.D.   On: 02/21/2023 14:13   DG Knee Complete 4 Views Left Result Date: 02/21/2023 CLINICAL DATA:  Left knee pain.  Leg swelling. EXAM: LEFT KNEE - COMPLETE 4+ VIEW COMPARISON:  05/08/2020. FINDINGS: No acute fracture or dislocation. No aggressive osseous lesion. There are degenerative changes of the knee joint in the form of mildly reduced medial tibio-femoral compartment joint space, tibial spiking and tricompartmental osteophytosis. No knee effusion or focal soft tissue swelling. No radiopaque foreign bodies. IMPRESSION: *No acute osseous abnormality of the left knee. 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 Electronically Signed   By: Jules Schick M.D.   On: 02/21/2023 14:12   US Venous Img Lower Bilateral (DVT) Result Date: 02/21/2023 CLINICAL DATA:  [id number] Leg swelling [id number] Patient c/o being short of breath and bilateral leg swelling times three days. The swelling is more so around the knees EXAM: BILATERAL LOWER EXTREMITY VENOUS DOPPLER ULTRASOUND TECHNIQUE: Gray-scale sonography with graded compression, as well as color Doppler and duplex ultrasound were performed to evaluate the lower extremity deep venous systems from the level of the common femoral vein and including the common femoral, femoral, profunda femoral, popliteal and calf  veins including the posterior tibial, peroneal and gastrocnemius veins when visible. The superficial great saphenous  vein was also interrogated. Spectral Doppler was utilized to evaluate flow at rest and with distal augmentation maneuvers in the common femoral, femoral and popliteal veins. COMPARISON:  CTA PE, 02/21/2023. LEFT lower extremity venous duplex, 11/25/2019 FINDINGS: RIGHT LOWER EXTREMITY VENOUS Normal compressibility of the RIGHT common femoral, superficial femoral, and popliteal veins, as well as the visualized calf veins. Visualized portions of profunda femoral vein and great saphenous vein unremarkable. No filling defects to suggest DVT on grayscale or color Doppler imaging. Doppler waveforms show normal direction of venous flow, normal respiratory plasticity and response to augmentation. OTHER No evidence of superficial thrombophlebitis or abnormal fluid collection. Limitations: none LEFT LOWER EXTREMITY VENOUS Normal compressibility of the LEFT common femoral, superficial femoral, and popliteal veins, as well as the visualized calf veins. Visualized portions of profunda femoral vein and great saphenous vein unremarkable. No filling defects to suggest DVT on grayscale or color Doppler imaging. Doppler waveforms show normal direction of venous flow, normal respiratory plasticity and response to augmentation. OTHER No evidence of superficial thrombophlebitis or abnormal fluid collection. Limitations: none IMPRESSION: No evidence of femoropopliteal DVT or superficial thrombophlebitis within either lower extremity. Roanna Banning, MD [facility] Electronically Signed   By: Roanna Banning M.D.   On: 02/21/2023 11:40   CT Angio Chest PE W/Cm &/Or Wo Cm Result Date: 02/21/2023 CLINICAL DATA:  Pulmonary embolism (PE) suspected, high prob. Shortness of breath. Bilateral leg swelling. EXAM: CT ANGIOGRAPHY CHEST WITH CONTRAST TECHNIQUE: Multidetector CT  imaging of the chest was performed using the standard protocol during bolus administration of intravenous contrast. Multiplanar CT image reconstructions and MIPs were obtained to evaluate the vascular anatomy. RADIATION DOSE REDUCTION: This exam was performed according to the departmental dose-optimization program which includes automated exposure control, adjustment of the mA and/or kV according to patient size and/or use of iterative reconstruction technique. CONTRAST:  75mL OMNIPAQUE IOHEXOL 350 MG/ML SOLN COMPARISON:  CT angiography chest from 01/03/2023. FINDINGS: Cardiovascular: No evidence of embolism to the proximal subsegmental pulmonary artery level. Normal cardiac size. No pericardial effusion. No aortic aneurysm. 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 Mediastinum/Nodes: Visualized thyroid gland appears grossly unremarkable. No solid / cystic mediastinal masses. The esophagus is nondistended precluding optimal assessment. No axillary, mediastinal or hilar lymphadenopathy by size criteria. Lungs/Pleura: The central tracheo-bronchial tree is patent. There are dependent changes in the bilateral lungs. No mass or consolidation. No pleural effusion or pneumothorax. No suspicious lung nodules. Upper Abdomen: There is a 2.4 x 2.4 cm partially exophytic simple cyst in the left kidney upper pole. Remaining visualized upper abdominal viscera within normal limits. Musculoskeletal: The visualized soft tissues of the chest wall are grossly unremarkable. No suspicious osseous lesions. There are mild multilevel degenerative changes in the visualized spine. Review of the MIP images confirms the above findings. IMPRESSION: 1. No embolism to the proximal subsegmental pulmonary artery level. 2. Multiple other nonacute observations, as described above. Electronically Signed   By: Jules Schick M.D.    On: 02/21/2023 11:19   DG Chest 2 View Result Date: 02/21/2023 CLINICAL DATA:  Chest pain and shortness of breath EXAM: CHEST - 2 VIEW COMPARISON:  Chest radiograph 02/02/2013 FINDINGS: The heart size and mediastinal contours are within normal limits. Both lungs are clear. The visualized skeletal structures are unremarkable. IMPRESSION: No active cardiopulmonary disease. Electronically Signed   By: Francis Gaines.D.  On: 02/21/2023 08:43    Microbiology: Recent Results (from the past 240 hours)  Resp panel by RT-PCR (RSV, Flu A&B, Covid) Anterior Nasal Swab     Status: None   Collection Time: 03/17/23  3:18 PM   Specimen: Anterior Nasal Swab  Result Value Ref Range Status   SARS Coronavirus 2 by RT PCR NEGATIVE NEGATIVE Final    Comment: (NOTE) SARS-CoV-2 target nucleic acids are NOT DETECTED.  The SARS-CoV-2 RNA is generally detectable in upper respiratory specimens during the acute phase of infection. The lowest concentration of SARS-CoV-2 viral copies this assay can detect is 138 copies/mL. A negative result does not preclude SARS-Cov-2 infection and should not be used as the sole basis for treatment or other patient management decisions. A negative result may occur with  improper specimen collection/handling, submission of specimen other than nasopharyngeal swab, presence of viral mutation(s) within the areas targeted by this assay, and inadequate number of viral copies(<138 copies/mL). A negative result must be combined with clinical observations, patient history, and epidemiological information. The expected result is Negative.  Fact Sheet for Patients:  BloggerCourse.com  Fact Sheet for Healthcare Providers:  SeriousBroker.it  This test is no t yet approved or cleared by the [place] FDA and  has been authorized for detection and/or diagnosis of SARS-CoV-2 by FDA under an Emergency Use Authorization (EUA). This EUA will  remain  in effect (meaning this test can be used) for the duration of the COVID-19 declaration under Section 564(b)(1) of the Act, 21 U.S.C.section 360bbb-3(b)(1), unless the authorization is terminated  or revoked sooner.       Influenza A by PCR NEGATIVE NEGATIVE Final   Influenza B by PCR NEGATIVE NEGATIVE Final    Comment: (NOTE) The Xpert Xpress SARS-CoV-2/FLU/RSV plus assay is intended as an aid in the diagnosis of influenza from Nasopharyngeal swab specimens and should not be used as a sole basis for treatment. Nasal washings and aspirates are unacceptable for Xpert Xpress SARS-CoV-2/FLU/RSV testing.  Fact Sheet for Patients: BloggerCourse.com  Fact Sheet for Healthcare Providers: SeriousBroker.it  This test is not yet approved or cleared by the [place] FDA and has been authorized for detection and/or diagnosis of SARS-CoV-2 by FDA under an Emergency Use Authorization (EUA). This EUA will remain in effect (meaning this test can be used) for the duration of the COVID-19 declaration under Section 564(b)(1) of the Act, 21 U.S.C. section 360bbb-3(b)(1), unless the authorization is terminated or revoked.     Resp Syncytial Virus by PCR NEGATIVE NEGATIVE Final    Comment: (NOTE) Fact Sheet for Patients: BloggerCourse.com  Fact Sheet for Healthcare Providers: SeriousBroker.it  This test is not yet approved or cleared by the [place] FDA and has been authorized for detection and/or diagnosis of SARS-CoV-2 by FDA under an Emergency Use Authorization (EUA). This EUA will remain in effect (meaning this test can be used) for the duration of the COVID-19 declaration under Section 564(b)(1) of the Act, 21 U.S.C. section 360bbb-3(b)(1), unless the authorization is terminated or revoked.  Performed at [facility], [address] [place], [place] [address]    Respiratory (~20 pathogens) panel by PCR     Status: None   Collection Time: 03/17/23  5:51 PM   Specimen: Nasopharyngeal Swab; Respiratory  Result Value Ref Range Status   Adenovirus NOT DETECTED NOT DETECTED Final   Coronavirus 229E NOT DETECTED NOT DETECTED Final    Comment: (NOTE) The Coronavirus on the Respiratory Panel, DOES NOT test for the  novel  Coronavirus (2019 nCoV)    Coronavirus HKU1 NOT DETECTED NOT DETECTED Final   Coronavirus NL63 NOT DETECTED NOT DETECTED Final   Coronavirus OC43 NOT DETECTED NOT DETECTED Final   Metapneumovirus NOT DETECTED NOT DETECTED Final   Rhinovirus / Enterovirus NOT DETECTED NOT DETECTED Final   Influenza A NOT DETECTED NOT DETECTED Final   Influenza B NOT DETECTED NOT DETECTED Final   Parainfluenza Virus 1 NOT DETECTED NOT DETECTED Final   Parainfluenza Virus 2 NOT DETECTED NOT DETECTED Final   Parainfluenza Virus 3 NOT DETECTED NOT DETECTED Final   Parainfluenza Virus 4 NOT DETECTED NOT DETECTED Final   Respiratory Syncytial Virus NOT DETECTED NOT DETECTED Final   Bordetella pertussis NOT DETECTED NOT DETECTED Final   Bordetella Parapertussis NOT DETECTED NOT DETECTED Final   Chlamydophila pneumoniae NOT DETECTED NOT DETECTED Final   Mycoplasma pneumoniae NOT DETECTED NOT DETECTED Final    Comment: Performed at [facility], [address], [place], [place] [address]     Labs: CBC: Recent Labs  Lab 03/17/23 1252  WBC 7.1  NEUTROABS 3.9  HGB 13.6  HCT 41.6  MCV 84.4  PLT 431*   Basic Metabolic Panel: Recent Labs  Lab 03/17/23 1252 03/18/23 0506 03/18/23 0507  NA 134*  --  135  K 3.9  --  4.6  CL 98  --  100  CO2 24  --  26  GLUCOSE 106*  --  162*  BUN 20  --  14  CREATININE 0.96  --  0.88  CALCIUM 8.8*  --  8.8*  MG  --  2.4  --   PHOS  --  3.2  --    Liver Function Tests: Recent Labs  Lab 03/17/23 1252  AST 19  ALT 24  ALKPHOS 57  BILITOT 0.7  PROT 7.5  ALBUMIN 4.0   No results for input(s):  "LIPASE", "AMYLASE" in the last 168 hours. No results for input(s): "AMMONIA" in the last 168 hours. Cardiac Enzymes: No results for input(s): "CKTOTAL", "CKMB", "CKMBINDEX", "TROPONINI" in the last 168 hours. BNP (last 3 results) Recent Labs    02/22/23 1710 03/08/23 0552 03/17/23 1252  BNP 32.2 13.3 6.5   CBG: No results for input(s): "GLUCAP" in the last 168 hours.  Time spent: 35 minutes  Signed:  Gillis Santa  [facility] 03/18/2023 12:13 PM

## 2023-03-24 ENCOUNTER — Inpatient Hospital Stay
Admission: EM | Admit: 2023-03-24 | Discharge: 2023-03-26 | DRG: 202 | Discharge status: Against Medical Advice | Payer: Self-pay | Attending: Internal Medicine | Admitting: Internal Medicine

## 2023-03-24 ENCOUNTER — Other Ambulatory Visit: Payer: Self-pay

## 2023-03-24 ENCOUNTER — Encounter: Payer: Self-pay | Admitting: Emergency Medicine

## 2023-03-24 ENCOUNTER — Emergency Department: Payer: Self-pay

## 2023-03-24 DIAGNOSIS — R059 Cough, unspecified: Secondary | ICD-10-CM

## 2023-03-24 DIAGNOSIS — Z1152 Encounter for screening for COVID-19: Secondary | ICD-10-CM

## 2023-03-24 DIAGNOSIS — G4733 Obstructive sleep apnea (adult) (pediatric): Secondary | ICD-10-CM | POA: Diagnosis present

## 2023-03-24 DIAGNOSIS — Z5329 Procedure and treatment not carried out because of patient's decision for other reasons: Secondary | ICD-10-CM | POA: Diagnosis present

## 2023-03-24 DIAGNOSIS — R0602 Shortness of breath: Secondary | ICD-10-CM

## 2023-03-24 DIAGNOSIS — I10 Essential (primary) hypertension: Secondary | ICD-10-CM | POA: Diagnosis present

## 2023-03-24 DIAGNOSIS — Z79899 Other long term (current) drug therapy: Secondary | ICD-10-CM

## 2023-03-24 DIAGNOSIS — E66813 Obesity, class 3: Secondary | ICD-10-CM | POA: Diagnosis present

## 2023-03-24 DIAGNOSIS — R12 Heartburn: Secondary | ICD-10-CM | POA: Diagnosis present

## 2023-03-24 DIAGNOSIS — F17200 Nicotine dependence, unspecified, uncomplicated: Secondary | ICD-10-CM | POA: Diagnosis present

## 2023-03-24 DIAGNOSIS — Z720 Tobacco use: Secondary | ICD-10-CM | POA: Diagnosis present

## 2023-03-24 DIAGNOSIS — K219 Gastro-esophageal reflux disease without esophagitis: Secondary | ICD-10-CM | POA: Diagnosis present

## 2023-03-24 DIAGNOSIS — Z8249 Family history of ischemic heart disease and other diseases of the circulatory system: Secondary | ICD-10-CM

## 2023-03-24 DIAGNOSIS — Z6841 Body Mass Index (BMI) 40.0 and over, adult: Secondary | ICD-10-CM

## 2023-03-24 DIAGNOSIS — Z91199 Patient's noncompliance with other medical treatment and regimen due to unspecified reason: Secondary | ICD-10-CM

## 2023-03-24 DIAGNOSIS — J45901 Unspecified asthma with (acute) exacerbation: Principal | ICD-10-CM | POA: Diagnosis present

## 2023-03-24 DIAGNOSIS — J4552 Severe persistent asthma with status asthmaticus: Principal | ICD-10-CM | POA: Diagnosis present

## 2023-03-24 DIAGNOSIS — F17210 Nicotine dependence, cigarettes, uncomplicated: Secondary | ICD-10-CM | POA: Diagnosis present

## 2023-03-24 DIAGNOSIS — J4551 Severe persistent asthma with (acute) exacerbation: Secondary | ICD-10-CM | POA: Diagnosis present

## 2023-03-24 DIAGNOSIS — Z9189 Other specified personal risk factors, not elsewhere classified: Secondary | ICD-10-CM

## 2023-03-24 LAB — RESP PANEL BY RT-PCR (RSV, FLU A&B, COVID)  RVPGX2
Influenza A by PCR: NEGATIVE
Influenza B by PCR: NEGATIVE
Resp Syncytial Virus by PCR: NEGATIVE
SARS Coronavirus 2 by RT PCR: NEGATIVE

## 2023-03-24 LAB — BASIC METABOLIC PANEL
Anion gap: 6 (ref 5–15)
BUN: 13 mg/dL (ref 6–20)
CO2: 23 mmol/L (ref 22–32)
Calcium: 8.5 mg/dL — ABNORMAL LOW (ref 8.9–10.3)
Chloride: 105 mmol/L (ref 98–111)
Creatinine, Ser: 0.8 mg/dL (ref 0.61–1.24)
GFR, Estimated: >60 mL/min (ref >60)
Glucose, Bld: 104 mg/dL — ABNORMAL HIGH (ref 70–99)
Potassium: 3.9 mmol/L (ref 3.5–5.1)
Sodium: 134 mmol/L — ABNORMAL LOW (ref 135–145)

## 2023-03-24 LAB — CBC
HCT: 41.6 % (ref 39.0–52.0)
Hemoglobin: 13.2 g/dL (ref 13.0–17.0)
MCH: 27.9 pg (ref 26.0–34.0)
MCHC: 31.7 g/dL (ref 30.0–36.0)
MCV: 87.9 fL (ref 80.0–100.0)
Platelets: 401 10*3/uL — ABNORMAL HIGH (ref 150–400)
RBC: 4.73 MIL/uL (ref 4.22–5.81)
RDW: 14.6 % (ref 11.5–15.5)
WBC: 7.9 10*3/uL (ref 4.0–10.5)
nRBC: 0 % (ref 0.0–0.2)

## 2023-03-24 LAB — TROPONIN I (HIGH SENSITIVITY)
Troponin I (High Sensitivity): 7 ng/L (ref <18)
Troponin I (High Sensitivity): 6 ng/L (ref <18)

## 2023-03-24 LAB — BRAIN NATRIURETIC PEPTIDE: B Natriuretic Peptide: 10.6 pg/mL (ref 0.0–100.0)

## 2023-03-24 MED ORDER — MAGNESIUM SULFATE 2 GM/50ML IV SOLN
2.0000 g | Freq: Once | INTRAVENOUS | Status: AC
  Administered 2023-03-24: 2 g via INTRAVENOUS
  Filled 2023-03-24: qty 50

## 2023-03-24 MED ORDER — METOPROLOL TARTRATE 50 MG PO TABS
50.0000 mg | ORAL_TABLET | Freq: Two times a day (BID) | ORAL | Status: DC
  Administered 2023-03-24 – 2023-03-26 (×4): 50 mg via ORAL
  Filled 2023-03-24 (×4): qty 1

## 2023-03-24 MED ORDER — HYDROCODONE-ACETAMINOPHEN 5-325 MG PO TABS
1.0000 | ORAL_TABLET | Freq: Four times a day (QID) | ORAL | Status: DC | PRN
  Administered 2023-03-25 – 2023-03-26 (×2): 1 via ORAL
  Filled 2023-03-24 (×2): qty 1

## 2023-03-24 MED ORDER — DIPHENHYDRAMINE HCL 25 MG PO CAPS
25.0000 mg | ORAL_CAPSULE | Freq: Four times a day (QID) | ORAL | Status: DC | PRN
Start: 2023-03-24 — End: 2023-03-26
  Administered 2023-03-24 – 2023-03-25 (×2): 25 mg via ORAL
  Filled 2023-03-24 (×2): qty 1

## 2023-03-24 MED ORDER — ACETAMINOPHEN 650 MG RE SUPP: 650.0000 mg | Freq: Four times a day (QID) | RECTAL | Status: DC | PRN

## 2023-03-24 MED ORDER — IPRATROPIUM-ALBUTEROL 0.5-2.5 (3) MG/3ML IN SOLN
3.0000 mL | Freq: Once | RESPIRATORY_TRACT | Status: AC
  Administered 2023-03-24: 3 mL via RESPIRATORY_TRACT

## 2023-03-24 MED ORDER — PANTOPRAZOLE SODIUM 40 MG PO TBEC
40.0000 mg | DELAYED_RELEASE_TABLET | Freq: Every day | ORAL | Status: DC
  Administered 2023-03-25: 40 mg via ORAL
  Filled 2023-03-24: qty 1

## 2023-03-24 MED ORDER — ALBUTEROL SULFATE (2.5 MG/3ML) 0.083% IN NEBU
2.5000 mg | INHALATION_SOLUTION | RESPIRATORY_TRACT | Status: DC | PRN
  Administered 2023-03-24 – 2023-03-26 (×6): 2.5 mg via RESPIRATORY_TRACT
  Filled 2023-03-24 (×8): qty 3

## 2023-03-24 MED ORDER — IPRATROPIUM-ALBUTEROL 0.5-2.5 (3) MG/3ML IN SOLN
3.0000 mL | Freq: Once | RESPIRATORY_TRACT | Status: AC
  Administered 2023-03-24: 3 mL via RESPIRATORY_TRACT
  Filled 2023-03-24: qty 3

## 2023-03-24 MED ORDER — MONTELUKAST SODIUM 10 MG PO TABS
10.0000 mg | ORAL_TABLET | Freq: Every day | ORAL | Status: DC
  Administered 2023-03-25 – 2023-03-26 (×2): 10 mg via ORAL
  Filled 2023-03-24 (×2): qty 1

## 2023-03-24 MED ORDER — SENNOSIDES-DOCUSATE SODIUM 8.6-50 MG PO TABS: 1.0000 | ORAL_TABLET | Freq: Every evening | ORAL | Status: DC | PRN

## 2023-03-24 MED ORDER — ACETAMINOPHEN 325 MG PO TABS: 650.0000 mg | ORAL_TABLET | Freq: Four times a day (QID) | ORAL | Status: DC | PRN

## 2023-03-24 MED ORDER — METHYLPREDNISOLONE SODIUM SUCC 125 MG IJ SOLR
60.0000 mg | Freq: Two times a day (BID) | INTRAMUSCULAR | Status: DC
  Administered 2023-03-24 – 2023-03-26 (×4): 60 mg via INTRAVENOUS
  Filled 2023-03-24 (×4): qty 2

## 2023-03-24 MED ORDER — GUAIFENESIN ER 600 MG PO TB12
600.0000 mg | ORAL_TABLET | Freq: Two times a day (BID) | ORAL | Status: DC
Start: 2023-03-24 — End: 2023-03-26
  Administered 2023-03-24 – 2023-03-26 (×4): 600 mg via ORAL
  Filled 2023-03-24 (×4): qty 1

## 2023-03-24 MED ORDER — ONDANSETRON HCL 4 MG/2ML IJ SOLN: 4.0000 mg | Freq: Four times a day (QID) | INTRAMUSCULAR | Status: DC | PRN

## 2023-03-24 MED ORDER — ENOXAPARIN SODIUM 80 MG/0.8ML IJ SOSY
68.0000 mg | PREFILLED_SYRINGE | INTRAMUSCULAR | Status: DC
  Administered 2023-03-24: 68 mg via SUBCUTANEOUS
  Filled 2023-03-24 (×3): qty 0.68

## 2023-03-24 MED ORDER — NICOTINE 14 MG/24HR TD PT24: 14.0000 mg | MEDICATED_PATCH | Freq: Every day | TRANSDERMAL | Status: DC | PRN

## 2023-03-24 MED ORDER — AMLODIPINE BESYLATE 5 MG PO TABS
5.0000 mg | ORAL_TABLET | Freq: Every day | ORAL | Status: DC
  Administered 2023-03-24 – 2023-03-26 (×3): 5 mg via ORAL
  Filled 2023-03-24 (×3): qty 1

## 2023-03-24 MED ORDER — PREDNISONE 20 MG PO TABS
40.0000 mg | ORAL_TABLET | Freq: Once | ORAL | Status: AC
  Administered 2023-03-24: 40 mg via ORAL
  Filled 2023-03-24: qty 2

## 2023-03-24 MED ORDER — LORATADINE 10 MG PO TABS
10.0000 mg | ORAL_TABLET | Freq: Every day | ORAL | Status: DC
Start: 2023-03-25 — End: 2023-03-26
  Administered 2023-03-25 – 2023-03-26 (×2): 10 mg via ORAL
  Filled 2023-03-24 (×2): qty 1

## 2023-03-24 MED ORDER — IPRATROPIUM-ALBUTEROL 0.5-2.5 (3) MG/3ML IN SOLN
3.0000 mL | Freq: Four times a day (QID) | RESPIRATORY_TRACT | Status: DC
  Administered 2023-03-24 – 2023-03-26 (×8): 3 mL via RESPIRATORY_TRACT
  Filled 2023-03-24 (×8): qty 3

## 2023-03-24 MED ORDER — ALBUTEROL SULFATE (2.5 MG/3ML) 0.083% IN NEBU
10.0000 mg/h | INHALATION_SOLUTION | Freq: Once | RESPIRATORY_TRACT | Status: AC
  Administered 2023-03-24: 10 mg/h via RESPIRATORY_TRACT
  Filled 2023-03-24: qty 3
  Filled 2023-03-24: qty 9

## 2023-03-24 MED ORDER — BUDESONIDE 0.25 MG/2ML IN SUSP
0.2500 mg | Freq: Two times a day (BID) | RESPIRATORY_TRACT | Status: DC
  Administered 2023-03-24 – 2023-03-26 (×4): 0.25 mg via RESPIRATORY_TRACT
  Filled 2023-03-24 (×5): qty 2

## 2023-03-24 MED ORDER — ONDANSETRON HCL 4 MG PO TABS: 4.0000 mg | ORAL_TABLET | Freq: Four times a day (QID) | ORAL | Status: DC | PRN

## 2023-03-24 NOTE — ED Triage Notes (Signed)
 Pt here with SOB since this morning. Pt has audible wheezing in triage. Pt states he tried to use his nebulizer but it has not helped. Pt endorses cp with his cough. Pt denies productive cough. Pt denies NVD.

## 2023-03-24 NOTE — ED Provider Triage Note (Signed)
 Emergency Medicine Provider Triage Evaluation Note  DEITRICH STEVE , a 43 y.o. male  was evaluated in triage.  Pt complains of wheezing that starte this morning. History of asthma. Denies chest pain  Physical Exam  BP (!) 143/110   Pulse (!) 104   Temp 98.5 F (36.9 C) (Oral)   Resp (!) 26   Ht 5\' 9"  (1.753 m)   Wt 136.1 kg   SpO2 98%   BMI 44.30 kg/m  Gen:   Awake, no distress   Resp:  Normal effort  MSK:   Moves extremities without difficulty  Other:    Medical Decision Making  Medically screening exam initiated at 1:43 PM.  Appropriate orders placed.  DARNEL MCHAN was informed that the remainder of the evaluation will be completed by another provider, this initial triage assessment does not replace that evaluation, and the importance of remaining in the ED until their evaluation is complete.  DuoNeb started.   Chinita Pester, FNP 03/24/23 1344

## 2023-03-24 NOTE — ED Notes (Signed)
 See triage notes. Patient c/o shortness of breath since this morning.

## 2023-03-24 NOTE — H&P (Signed)
 History and Physical    Patient: Justin Reid WUJ:[id number] DOB: 1980/07/04 DOA: 03/24/2023 DOS: the patient was seen and examined on 03/24/2023 PCP: Pcp, No  Patient coming from: Home  Chief Complaint:  Chief Complaint  Patient presents with   Shortness of Breath   HPI: Justin Reid is a 43 y.o. male with medical history significant of asthma with multiple recent exacerbations, tobacco use disorder, hypertension, OSA/OHS on CPAP, who presents to the ED due to shortness of breath. Patient was discharged on 03/18/23 after admission for asthma exacerbation.  Pt is extremely short of breath during encounter, therefore detailed history is somewhat limited.  He reports initially feeling better after discharge, but today had return of severe wheezing and dyspnea.  Did not improve with use of his nebulizer treatments at home.  He denies ever requiring intubation for asthma exacerbations in the past, but has had frequent admissions.  He denies being exposed to irritants he may have inhaled.  Denies congestion, fever/chills, sore throat, sick contacts, nausea/vomiting, diarrhea, or other symptoms.  ED course --  Initial vitals -- temp 98.5 F, HR 104, RR 26, BP 143/110, spO2 98% on room air. Labs -- BMP normal except for Na 134, glucose 104, Ca 8.5.  CBC notable for platelets 401k. Respiratory panel negative for Covid, Flu and RSV Imaging --- chest xray no acute findings.    Review of Systems: As mentioned in the history of present illness. All other systems reviewed and are negative.   Past Medical History:  Diagnosis Date   2019-nCoV vaccination declined 11/10/2020   Arthritis    Asthma    Hypertension    Morbid obesity (HCC)    Shingles    Tobacco use    History reviewed. No pertinent surgical history. Social History:  reports that he has been smoking cigarettes. He has a 2.5 pack-year smoking history. He has never used smokeless tobacco. He reports current alcohol use. He reports  that he does not use drugs.  No Known Allergies  Family History  Problem Relation Age of Onset   Hypertension Father     Prior to Admission medications   Medication Sig Start Date End Date Taking? Authorizing Provider  albuterol (VENTOLIN HFA) 108 (90 Base) MCG/ACT inhaler Inhale 2 puffs into the lungs every 4 (four) hours as needed for wheezing or shortness of breath. 02/25/23   Sunnie Nielsen, DO  amLODipine (NORVASC) 5 MG tablet Take 1 tablet (5 mg total) by mouth daily. 02/25/23 05/26/23  Sunnie Nielsen, DO  Dextromethorphan-guaiFENesin 20-200 MG/20ML LIQD Take 10 mLs by mouth every 6 (six) hours as needed. 03/18/23   Gillis Santa, MD  fluticasone-salmeterol [facility] INHUB) 250-50 MCG/ACT AEPB Inhale 1 puff into the lungs in the morning and at bedtime. 03/18/23   Gillis Santa, MD  ipratropium-albuterol (DUONEB) 0.5-2.5 (3) MG/3ML SOLN Take 3 mLs by nebulization every 2 (two) hours as needed (wheezing, shortness of breath). 02/25/23   Sunnie Nielsen, DO  loratadine (CLARITIN) 10 MG tablet Take 1 tablet (10 mg total) by mouth daily. 02/26/23   Sunnie Nielsen, DO  metoprolol tartrate (LOPRESSOR) 50 MG tablet Take 1 tablet (50 mg total) by mouth 2 (two) times daily. 03/10/23 06/08/23  Gillis Santa, MD  montelukast (SINGULAIR) 10 MG tablet Take 1 tablet (10 mg total) by mouth daily. 02/25/23   Sunnie Nielsen, DO  nicotine (NICODERM CQ - DOSED IN MG/24 HOURS) 14 mg/24hr patch Place 1 patch (14 mg total) onto the skin daily as needed (nicotine craving).  02/25/23   Sunnie Nielsen, DO  pantoprazole (PROTONIX) 40 MG tablet Take 1 tablet (40 mg total) by mouth daily. 02/26/23   Sunnie Nielsen, DO    Physical Exam: Vitals:   03/24/23 1205 03/24/23 1614  BP: (!) 143/110 (!) 144/98  Pulse: (!) 104 87  Resp: (!) 26 (!) 26  Temp: 98.5 F (36.9 C) 97.8 F (36.6 C)  TempSrc: Oral Oral  SpO2: 98% 95%  Weight: 136.1 kg   Height: 5\' 9"  (1.753 m)    General exam: awake, alert, no  acute distress, obese HEENT: atraumatic, clear conjunctiva, anicteric sclera, moist mucus membranes, hearing grossly normal  Respiratory system: severely diminished air movement, diffuse high-pitched expiratory wheezes, no rhonchi, increased respiratory effort with accessory muscle use and conversational dyspnea. Cardiovascular system: normal S1/S2, RRR, no JVD, murmurs, rubs, gallops, no pedal edema.   Gastrointestinal system: soft, NT, ND, no HSM felt, +bowel sounds. Central nervous system: A&O x 3. no gross focal neurologic deficits, normal speech Extremities: moves all, no edema, normal tone Skin: dry, intact, normal temperature, normal color, No rashes, lesions or ulcers Psychiatry: normal mood, congruent affect, judgement and insight appear normal' Data Reviewed: {Tip this will not be part of the note when signed- Document your independent interpretation of telemetry tracing, EKG, lab, Radiology test or any other diagnostic tests. Add any new diagnostic test ordered today. (Optional):26781} As reviewed above  Assessment and Plan: No notes have been filed under this hospital service. Service: Hospitalist     Advance Care Planning: Code status: full code  Consults: ***  Family Communication: ***  Severity of Illness: {Observation/Inpatient:21159}  Author: Pennie Banter, DO 03/24/2023 5:18 PM  For on call review [email or web address].

## 2023-03-24 NOTE — ED Provider Notes (Signed)
 [facility] Provider Note    Event Date/Time   First MD Initiated Contact with Patient 03/24/23 1608     (approximate)   History   Shortness of Breath   HPI  Justin Reid is a 43 y.o. male with history of asthma, hypertension, OSA, presenting with shortness of breath and cough.  No chest pain but notes some chest tightness.  States his symptoms started this morning.  Tried his home inhalers without any improvement.  Denies any fevers, unilateral calf swelling or tenderness, abdominal pain, nausea, vomiting, diarrhea, urinary symptoms.  Cough is nonproductive.  On independent chart review, he was admitted to the hospital multiple times in the last 2 months for asthma exacerbation, last was in mid March.      Physical Exam   Triage Vital Signs: ED Triage Vitals [03/24/23 1205]  Encounter Vitals Group     BP (!) 143/110     Systolic BP Percentile      Diastolic BP Percentile      Pulse Rate (!) 104     Resp (!) 26     Temp 98.5 F (36.9 C)     Temp Source Oral     SpO2 98 %     Weight 300 lb (136.1 kg)     Height 5\' 9"  (1.753 m)     Head Circumference      Peak Flow      Pain Score 0     Pain Loc      Pain Education      Exclude from Growth Chart     Most recent vital signs: Vitals:   03/24/23 1205 03/24/23 1614  BP: (!) 143/110 (!) 144/98  Pulse: (!) 104 87  Resp: (!) 26 (!) 26  Temp: 98.5 F (36.9 C) 97.8 F (36.6 C)  SpO2: 98% 95%     General: Awake, no distress.  CV:  Good peripheral perfusion.  Resp:  Normal effort.  Diminished breath sounds bilaterally, sparse distant wheeze on the right anterior, he is tachypneic Abd:  No distention.  Soft nontender Other:  No unilateral calf swelling or tenderness  ED Results / Procedures / Treatments   Labs (all labs ordered are listed, but only abnormal results are displayed) Labs Reviewed  CBC - Abnormal; Notable for the following components:      Result Value   Platelets 401  (*)    All other components within normal limits  BASIC METABOLIC PANEL - Abnormal; Notable for the following components:   Sodium 134 (*)    Glucose, Bld 104 (*)    Calcium 8.5 (*)    All other components within normal limits  RESP PANEL BY RT-PCR (RSV, FLU A&B, COVID)  RVPGX2  BRAIN NATRIURETIC PEPTIDE  TROPONIN I (HIGH SENSITIVITY)     EKG  EKG shows sinus rhythm, rate 99, normal QRS, normal QTc, T wave flattening in 1, aVL, V5, V6, no ischemic ST elevation, not significant compared to prior   RADIOLOGY X-ray on my independent interpretation without focal consolidation.   PROCEDURES:  Critical Care performed: Yes, see critical care procedure note(s)  .Critical Care  Performed by: Claybon Jabs, MD Authorized by: Claybon Jabs, MD   Critical care provider statement:    Critical care time (minutes):  35   Critical care was necessary to treat or prevent imminent or life-threatening deterioration of the following conditions:  Respiratory failure   Critical care was time spent personally by me  on the following activities:  Development of treatment plan with patient or surrogate, discussions with consultants, evaluation of patient's response to treatment, examination of patient, ordering and review of laboratory studies, ordering and review of radiographic studies, ordering and performing treatments and interventions, pulse oximetry, re-evaluation of patient's condition and review of old charts    MEDICATIONS ORDERED IN ED: Medications  magnesium sulfate IVPB 2 g 50 mL (2 g Intravenous New Bag/Given 03/24/23 1707)  albuterol (PROVENTIL) (2.5 MG/3ML) 0.083% nebulizer solution (has no administration in time range)  enoxaparin (LOVENOX) injection 68 mg (has no administration in time range)  acetaminophen (TYLENOL) tablet 650 mg (has no administration in time range)    Or  acetaminophen (TYLENOL) suppository 650 mg (has no administration in time range)  HYDROcodone-acetaminophen  (NORCO/VICODIN) 5-325 MG per tablet 1 tablet (has no administration in time range)  senna-docusate (Senokot-S) tablet 1 tablet (has no administration in time range)  ondansetron (ZOFRAN) tablet 4 mg (has no administration in time range)    Or  ondansetron (ZOFRAN) injection 4 mg (has no administration in time range)  guaiFENesin (MUCINEX) 12 hr tablet 600 mg (has no administration in time range)  albuterol (PROVENTIL) (2.5 MG/3ML) 0.083% nebulizer solution 2.5 mg (has no administration in time range)  ipratropium-albuterol (DUONEB) 0.5-2.5 (3) MG/3ML nebulizer solution 3 mL (has no administration in time range)  methylPREDNISolone sodium succinate (SOLU-MEDROL) 125 mg/2 mL injection 60 mg (has no administration in time range)  budesonide (PULMICORT) nebulizer solution 0.25 mg (has no administration in time range)  diphenhydrAMINE (BENADRYL) capsule 25 mg (has no administration in time range)  ipratropium-albuterol (DUONEB) 0.5-2.5 (3) MG/3ML nebulizer solution 3 mL (3 mLs Nebulization Given 03/24/23 1209)  ipratropium-albuterol (DUONEB) 0.5-2.5 (3) MG/3ML nebulizer solution 3 mL (3 mLs Nebulization Given 03/24/23 1436)  ipratropium-albuterol (DUONEB) 0.5-2.5 (3) MG/3ML nebulizer solution 3 mL (3 mLs Nebulization Given 03/24/23 1644)  predniSONE (DELTASONE) tablet 40 mg (40 mg Oral Given 03/24/23 1643)     IMPRESSION / MDM / ASSESSMENT AND PLAN / ED COURSE  I reviewed the triage vital signs and the nursing notes.                              Differential diagnosis includes, but is not limited to, asthma exacerbation, COVID, influenza, RSV, pneumonia, ACS, considered CHF.  Labs, EKG, troponin, chest x-ray, he was given 2 DuoNebs in triage, will add a third DuoNeb, will give him prednisone, IV mag.  Patient's presentation is most consistent with acute presentation with potential threat to life or bodily function.  Independent review of labs and imaging are below.  Consult to hospitalist for  admission and they are agreeable plan, will evaluate him.  Shared decision making done with patient and he is agreeable with plan for admission.  He is admitted.  Clinical Course as of 03/24/23 1734  Thu Mar 24, 2023  1621 DG Chest 2 View No active cardiopulmonary disease.  [TT]  1711 On reassessment patient still tachypneic, still feels short of breath, after last DuoNeb, not able to hear distant wheezing diffusely, will give him some continuous albuterol and plan and have admitted for further management. [TT]  1720 Independent review of labs, electrolyte severely deranged, no leukocytosis, BNP and troponin negative. [TT]    Clinical Course User Index [TT] Jodie Echevaria Franchot Erichsen, MD     FINAL CLINICAL IMPRESSION(S) / ED DIAGNOSES   Final diagnoses:  Exacerbation of asthma, unspecified asthma severity,  unspecified whether persistent  SOB (shortness of breath)  Cough, unspecified type     Rx / DC Orders   ED Discharge Orders     None        Note:  This document was prepared using Dragon voice recognition software and may include unintentional dictation errors.    Claybon Jabs, MD 03/24/23 [phone]

## 2023-03-25 ENCOUNTER — Encounter: Payer: Self-pay | Admitting: Internal Medicine

## 2023-03-25 DIAGNOSIS — Z720 Tobacco use: Secondary | ICD-10-CM

## 2023-03-25 DIAGNOSIS — J4551 Severe persistent asthma with (acute) exacerbation: Secondary | ICD-10-CM

## 2023-03-25 DIAGNOSIS — I10 Essential (primary) hypertension: Secondary | ICD-10-CM

## 2023-03-25 DIAGNOSIS — K219 Gastro-esophageal reflux disease without esophagitis: Secondary | ICD-10-CM

## 2023-03-25 MED ORDER — FUROSEMIDE 10 MG/ML IJ SOLN
20.0000 mg | Freq: Once | INTRAMUSCULAR | Status: AC
  Administered 2023-03-25: 20 mg via INTRAVENOUS
  Filled 2023-03-25: qty 2

## 2023-03-25 MED ORDER — PANTOPRAZOLE SODIUM 40 MG PO TBEC: 40.0000 mg | DELAYED_RELEASE_TABLET | Freq: Two times a day (BID) | ORAL | Status: DC

## 2023-03-25 MED ORDER — ALBUTEROL SULFATE (2.5 MG/3ML) 0.083% IN NEBU
10.0000 mg/h | INHALATION_SOLUTION | Freq: Once | RESPIRATORY_TRACT | Status: AC
  Administered 2023-03-25: 10 mg/h via RESPIRATORY_TRACT
  Filled 2023-03-25: qty 12

## 2023-03-25 MED ORDER — PANTOPRAZOLE SODIUM 40 MG PO TBEC
40.0000 mg | DELAYED_RELEASE_TABLET | Freq: Two times a day (BID) | ORAL | Status: DC
  Administered 2023-03-25 – 2023-03-26 (×2): 40 mg via ORAL
  Filled 2023-03-25 (×2): qty 1

## 2023-03-25 MED ORDER — ALUM & MAG HYDROXIDE-SIMETH 200-200-20 MG/5ML PO SUSP
30.0000 mL | Freq: Four times a day (QID) | ORAL | Status: DC | PRN
  Administered 2023-03-25 (×2): 30 mL via ORAL
  Filled 2023-03-25 (×2): qty 30

## 2023-03-25 NOTE — ED Notes (Signed)
 Patient has sleep apnea and desats when he is sleeping.

## 2023-03-25 NOTE — ED Notes (Signed)
 Provided family with drinks per request.

## 2023-03-25 NOTE — Progress Notes (Addendum)
 Progress Note   Patient: Justin Reid:[id number] DOB: 03-Oct-1980 DOA: 03/24/2023     1 DOS: the patient was seen and examined on 03/25/2023   Brief hospital course:  "YOLTZIN BARG is a 43 y.o. male with medical history significant of asthma with multiple recent exacerbations, tobacco use disorder, hypertension, OSA/OHS on CPAP, who presents to the ED due to shortness of breath. Patient was discharged on 03/18/23 after admission for asthma exacerbation.  Pt is extremely short of breath during encounter, therefore detailed history is somewhat limited.  He reports initially feeling better after discharge, but today had return of severe wheezing and dyspnea.  Did not improve with use of his nebulizer treatments at home.  He denies ever requiring intubation for asthma exacerbations in the past, but has had frequent admissions.  He denies being exposed to irritants he may have inhaled.  Denies congestion, fever/chills, sore throat, sick contacts, nausea/vomiting, diarrhea, or other symptoms.   ED course --  Initial vitals -- temp 98.5 F, HR 104, RR 26, BP 143/110, spO2 98% on room air. Labs -- BMP normal except for Na 134, glucose 104, Ca 8.5.  CBC notable for platelets 401k. Respiratory panel negative for Covid, Flu and RSV Imaging --- chest xray no acute findings."   Pt was admitted on 03/24/23 for recurrent severe asthma exacerbation. Started on IV steroids, frequent nebulized bronchodilators. O2 sats stable on room air.    Assessment and Plan:  Acute Asthma Exacerbation, recurrent, frequent Severe persistent asthma 3/21 -- pt remains in severe bronchospasm --Continuous albuterol neb x 1 hour --Consult Pulmonology  --Continue IV Solu-medrol 60 mg BID --Scheduled Duonebs & PRN Albuterol nebs --Pulmicort nebs --Mucinex --Continue home antihistamine & Singulair --O2 sats stable on room air, supplement O2 if < 90% on room air  OSA  -- CPAP ordered nightly  GERD  -- on  PPI  Hypertension --Amlodipine 5 mg daily  Morbid obesity - Body mass index is 44.3 kg/m. Complicates overall care and prognosis.  Recommend lifestyle modifications including physical activity and diet for weight loss and overall long-term health.  Nicotine dependence, Tobacco abuse Patient is aware of the importance of smoking cessation.  He has been utilizing a patch at home. --Nicotine patch ordered       Subjective:  Pt remains severely short of breath this AM. Gets very short relief after neb treatment, but no sustained relief yet since admission.  States he does not see a Pulmonologist as outpatient.      Physical Exam: Vitals:   03/25/23 0800 03/25/23 0930 03/25/23 1149 03/25/23 1235  BP: (!) 142/81 (!) 158/90 (!) 154/98   Pulse: 78 77 82   Resp: (!) 22 (!) 22 (!) 26   Temp:   98.3 F (36.8 C)   TempSrc:   Oral   SpO2: 96% 96% 96% 94%  Weight:      Height:       General exam: awake, alert, no acute distress, obese Respiratory system: severely decreased aeration, diffuse high-pitched expiratory wheezes throughout, using accessory muscles to breathe, on room air Cardiovascular system: normal S1/S2, RRR, no pedal edema.   Gastrointestinal system: soft, NT, ND Central nervous system: A&O x 3. no gross focal neurologic deficits, normal speech Extremities: moves all, no edema, normal tone Skin: dry, intact, normal temperature Psychiatry: normal mood, congruent affect, judgement and insight appear normal   Data Reviewed:  No new labs today  Family Communication: significant other at bedside on rounds  Disposition: Status  is: Inpatient Remains inpatient appropriate because: needs further improvement, severely dypsneic, remains on IV steroids   Planned Discharge Destination: Home    Time spent: 42 minutes  Author: Pennie Banter, DO 03/25/2023 1:35 PM  For on call review [email or web address].

## 2023-03-25 NOTE — Consult Note (Signed)
 NAME:  Justin Reid, MRN:  [id number], DOB:  05/01/1980, LOS: 1 ADMISSION DATE:  03/24/2023, CONSULTATION DATE: 03/25/2023 REFERRING MD: Esaw Grandchild, DO, CHIEF COMPLAINT: Status asthmaticus  History of Present Illness:  Patient is a 43 year old who presented to [facility] on 03/20 with a complaint of asthma exacerbation.  Review of his records shows multiple ED visits and admissions for asthma exacerbations.  He does not appear to have a regular physician and appears to rely on emergency room for refill of medications etc.  He has a history of hypertension and states that he has had asthma since childhood.  He was discharged on 14 March with a similar episode and was discharged before pulmonary consultation could be obtained.  Review of his records show poor control of hypertension and of asthma.  This is highly suspicious of poor compliance.  After his 14 March admission he was discharged on fluticasone salmeterol 250/50, 1 inhalation twice a day and as needed albuterol.  It is not clear if he has been using these medications as directed.  He also was given nebulization equipment and DuoNeb for use as needed.   He has been smoking which he notes has exacerbated his symptoms.  He also has had issues with significant gastroesophageal reflux and frequent heartburn.  He is a high suspect for obstructive sleep apnea.  No recent fevers, chills or sweats.  Dry nonproductive cough associated with wheezing noted.  This pain though he has chest tightness.  I have reviewed his current admission laboratory data and radiographs.  No evidence of eosinophilia on prior multiple CBCs.  Echocardiogram normal.  Pertinent  Medical History  Asthma Morbid obesity Hypertension Tobacco dependence due to cigarettes GERD Medical noncompliance  Significant Hospital Events: Including procedures, antibiotic start and stop dates in addition to other pertinent events   03/24/2023 admitted to [facility] with asthma  exacerbation 03/25/2023 PCCM consulted  Interim History / Subjective:  Still feels short of breath but compared to admission feels that he is improving.  No chest pain endorse, significant heartburn and reflux noted.  Objective   Blood pressure (!) 142/82, pulse 82, temperature 98.5 F (36.9 C), temperature source Oral, resp. rate (!) 26, height 5\' 9"  (1.753 m), weight 136.1 kg, SpO2 94%.    FiO2 (%):  [21 %] 21 %  No intake or output data in the 24 hours ending 03/25/23 1519 Filed Weights   03/24/23 1205  Weight: 136.1 kg    Examination: GENERAL: Morbidly obese male, mildly tachypneic, no conversational dyspnea, laying in bed. HEAD: Normocephalic, atraumatic.  EYES: Pupils equal, round, reactive to light.  No scleral icterus.  MOUTH: Mallampati IV, oral mucosa moist, no thrush. NECK: Supple. No thyromegaly. Trachea midline. No JVD.  No adenopathy. PULMONARY: Good air entry bilaterally.  Faint end expiratory wheezes noted no rhonchi. CARDIOVASCULAR: S1 and S2. Regular rate and rhythm.  No rubs, murmurs or gallops heard. ABDOMEN: Obese, otherwise benign. MUSCULOSKELETAL: No joint deformity, no clubbing, no edema.  NEUROLOGIC: No overt focal deficit, gait not tested, speech is fluent. SKIN: Intact,warm,dry. PSYCH: Calm, cooperative.   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) Noncompliance may aggravate issue Continue bronchodilators Continue pulmonary hygiene No evidence of infectious process at present Continue IV steroids Continue montelukast Received magnesium sulfate earlier in course Trial of Lasix IV x 1  Poorly controlled hypertension Query volume overload Trial of Lasix x 1 Continue antihypertensives  Gastroesophageal reflux This can aggravate asthma symptoms Continue PPI Continue antireflux measures  Tobacco dependence due to  cigarettes Counseled regards to discontinuation of smoking  Morbid obesity High suspect for obstructive sleep apnea/OHVS Would benefit from weight loss Will need sleep study as outpatient   Labs   CBC: Recent Labs  Lab 03/24/23 1208  WBC 7.9  HGB 13.2  HCT 41.6  MCV 87.9  PLT 401*    Basic Metabolic Panel: Recent Labs  Lab 03/24/23 1208  NA 134*  K 3.9  CL 105  CO2 23  GLUCOSE 104*  BUN 13  CREATININE 0.80  CALCIUM 8.5*   GFR: Estimated Creatinine Clearance: 166.5 mL/min (by C-G formula based on SCr of 0.8 mg/dL). Recent Labs  Lab 03/24/23 1208  WBC 7.9    Liver Function Tests: No results for input(s): "AST", "ALT", "ALKPHOS", "BILITOT", "PROT", "ALBUMIN" in the last 168 hours. No results for input(s): "LIPASE", "AMYLASE" in the last 168 hours. No results for input(s): "AMMONIA" in the last 168 hours.  ABG    Component Value Date/Time   PHART 7.61 (HH) 02/20/2016 0203   PCO2ART 23 (L) 02/20/2016 0203   PO2ART 167 (H) 02/20/2016 0203   HCO3 29.1 (H) 03/18/2023 0507   O2SAT 77.3 03/18/2023 0507     Coagulation Profile: No results for input(s): "INR", "PROTIME" in the last 168 hours.  Cardiac Enzymes: No results for input(s): "CKTOTAL", "CKMB", "CKMBINDEX", "TROPONINI" in the last 168 hours.  HbA1C: Hgb A1c MFr Bld  Date/Time Value Ref Range Status  03/09/2023 04:48 AM 5.8 (H) 4.8 - 5.6 % Final    Comment:    (NOTE) Pre diabetes:          5.7%-6.4%  Diabetes:              >6.4%  Glycemic control for   <7.0% adults with diabetes     CBG: No results for input(s): "GLUCAP" in the last 168 hours.  Review of Systems:   A 10 point review of systems was performed and it is as noted above otherwise negative.  Past Medical History:  He,  has a past medical history of 2019-nCoV vaccination declined (11/10/2020), Arthritis, Asthma, Hypertension, Morbid obesity (HCC), Shingles, and Tobacco use.   Surgical History:  History reviewed. No  pertinent surgical history.   Social History:   reports that he has been smoking cigarettes. He has a 2.5 pack-year smoking history. He has never used smokeless tobacco. He reports current alcohol use. He reports that he does not use drugs.   Family History:  His family history includes Hypertension in his father.   Allergies No Known Allergies   Home Medications  Prior to Admission medications   Medication Sig Start Date End Date Taking? Authorizing Provider  albuterol (VENTOLIN HFA) 108 (90 Base) MCG/ACT inhaler Inhale 2 puffs into the lungs every 4 (four) hours as needed for wheezing or shortness of breath. 02/25/23  Yes Sunnie Nielsen, DO  amLODipine (NORVASC) 5 MG tablet Take 1 tablet (5 mg total) by mouth daily. 02/25/23 05/26/23 Yes Sunnie Nielsen, DO  loratadine (CLARITIN) 10 MG tablet Take 1 tablet (10 mg total) by mouth daily. 02/26/23  Yes Sunnie Nielsen, DO  montelukast (SINGULAIR) 10 MG tablet Take 1 tablet (10 mg total) by mouth daily. 02/25/23  Yes Sunnie Nielsen, DO  Dextromethorphan-guaiFENesin 20-200 MG/20ML LIQD Take 10 mLs by mouth every 6 (six) hours as needed. 03/18/23   Gillis Santa, MD  fluticasone-salmeterol [facility] INHUB) 250-50 MCG/ACT AEPB Inhale  1 puff into the lungs in the morning and at bedtime. 03/18/23   Gillis Santa, MD  ipratropium-albuterol (DUONEB) 0.5-2.5 (3) MG/3ML SOLN Take 3 mLs by nebulization every 2 (two) hours as needed (wheezing, shortness of breath). 02/25/23   Sunnie Nielsen, DO  metoprolol tartrate (LOPRESSOR) 50 MG tablet Take 1 tablet (50 mg total) by mouth 2 (two) times daily. Patient not taking: Reported on 03/25/2023 03/10/23 06/08/23  Gillis Santa, MD  nicotine (NICODERM CQ - DOSED IN MG/24 HOURS) 14 mg/24hr patch Place 1 patch (14 mg total) onto the skin daily as needed (nicotine craving). 02/25/23   Sunnie Nielsen, DO  pantoprazole (PROTONIX) 40 MG tablet Take 1 tablet (40 mg total) by mouth daily. 02/26/23   Sunnie Nielsen, DO    Scheduled Meds:  amLODipine  5 mg Oral Daily   budesonide (PULMICORT) nebulizer solution  0.25 mg Nebulization BID   enoxaparin (LOVENOX) injection  68 mg Subcutaneous Q24H   guaiFENesin  600 mg Oral BID   ipratropium-albuterol  3 mL Nebulization Q6H   loratadine  10 mg Oral Daily   methylPREDNISolone (SOLU-MEDROL) injection  60 mg Intravenous Q12H   metoprolol tartrate  50 mg Oral BID   montelukast  10 mg Oral Daily   pantoprazole  40 mg Oral BID   Continuous Infusions: PRN Meds:.acetaminophen **OR** acetaminophen, albuterol, alum & mag hydroxide-simeth, diphenhydrAMINE, HYDROcodone-acetaminophen, nicotine, ondansetron **OR** ondansetron (ZOFRAN) IV, senna-docusate   Level 4 consult    I spent 62 minutes of dedicated to the care of this patient on the date of this encounter to include pre-visit review of records, face-to-face time with the patient discussing conditions above, post visit ordering of testing, clinical documentation with the electronic health record, making appropriate referrals as documented, and communicating necessary findings to members of the patients care team.   Gailen Shelter, MD Advanced Bronchoscopy PCCM [facility]    *This note was generated using voice recognition software/Dragon and/or AI transcription program.  Despite best efforts to proofread, errors can occur which can change the meaning. Any transcriptional errors that result from this process are unintentional and may not be fully corrected at the time of dictation.

## 2023-03-25 NOTE — Progress Notes (Signed)
 Pt admitted for asthma exacerbation A&OX4, 2LNC. IV lasix and solumedrol given. Duo neb given. Breathing became clearer. Justin Reid oterh needs voiced at this time. No skin issues. Bed in lowest position, urinal given for lasix, wheels locked, call bell near by BP 139/75 (BP Location: Right Arm)   Pulse 81   Temp 97.7 F (36.5 C) (Oral)   Resp 18   Ht 5\' 9"  (1.753 m)   Wt 136.1 kg   SpO2 96%   BMI 44.30 kg/m  03/25/23 7:02 PM

## 2023-03-25 NOTE — ED Notes (Signed)
 Respiratory called to do continuous albuterol treatment.

## 2023-03-25 NOTE — ED Notes (Signed)
 Breathing treatment completed.  Patient requesting GERD meds MD notified and new orders placed.

## 2023-03-25 NOTE — ED Notes (Signed)
 Admitting MD at bedside.  Notified patient is still struggling to breathe and audible wheezing.

## 2023-03-25 NOTE — ED Notes (Signed)
 Ambulatory to bathroom.

## 2023-03-26 ENCOUNTER — Inpatient Hospital Stay: Payer: Self-pay

## 2023-03-26 MED ORDER — IOHEXOL 300 MG/ML  SOLN
75.0000 mL | Freq: Once | INTRAMUSCULAR | Status: AC | PRN
  Administered 2023-03-26: 75 mL via INTRAVENOUS

## 2023-03-26 NOTE — Progress Notes (Signed)
 NAME:  Justin Reid, MRN:  [id number], DOB:  [birth year], LOS: 2 ADMISSION DATE:  03/24/2023, CONSULTATION DATE: 03/25/2023 REFERRING MD: Esaw Grandchild, DO, CHIEF COMPLAINT: Status asthmaticus  History of Present Illness:  Patient is a 43 year old who presented to [facility] on 03/20 with a complaint of asthma exacerbation.  Review of his records shows multiple ED visits and admissions for asthma exacerbations.  He does not appear to have a regular physician and appears to rely on emergency room for refill of medications etc.  He has a history of hypertension and states that he has had asthma since childhood.  He was discharged on 14 March with a similar episode and was discharged before pulmonary consultation could be obtained.  Review of his records show poor control of hypertension and of asthma.  This is highly suspicious of poor compliance.  After his 14 March admission he was discharged on fluticasone salmeterol 250/50, 1 inhalation twice a day and as needed albuterol.  It is not clear if he has been using these medications as directed.  He also was given nebulization equipment and DuoNeb for use as needed.   He has been smoking which he notes has exacerbated his symptoms.  He also has had issues with significant gastroesophageal reflux and frequent heartburn.  He is a high suspect for obstructive sleep apnea.  No recent fevers, chills or sweats.  Dry nonproductive cough associated with wheezing noted.  This pain though he has chest tightness.  I have reviewed his current admission laboratory data and radiographs.  No evidence of eosinophilia on prior multiple CBCs.  Echocardiogram normal.  Pertinent  Medical History  Asthma Morbid obesity Hypertension Tobacco dependence due to cigarettes GERD Medical noncompliance  Significant Hospital Events: Including procedures, antibiotic start and stop dates in addition to other pertinent events   03/24/2023 admitted to [facility] with asthma  exacerbation 03/25/2023 PCCM consulted 03/26/2023   Interim History / Subjective:  States that he is "no better" today.  Wife is at bedside.  Heartburn and reflux better controlled.  No conversational dyspnea.  Sounds very nasally congested.  Nonproductive cough noted today.  Objective   Blood pressure (!) 145/87, pulse 85, temperature 98 F (36.7 C), temperature source Oral, resp. rate 20, height 5\' 9"  (1.753 m), weight 136.1 kg, SpO2 94%.    FiO2 (%):  [21 %] 21 %   Intake/Output Summary (Last 24 hours) at 03/26/2023 1045 Last data filed at 03/26/2023 1035 Gross per 24 hour  Intake 240 ml  Output --  Net 240 ml   Filed Weights   03/24/23 1205  Weight: 136.1 kg    Examination: GENERAL: Morbidly obese male, no tachypnea, no conversational dyspnea, laying in bed. HEAD: Normocephalic, atraumatic.  EYES: Pupils equal, round, reactive to light.  No scleral icterus.  MOUTH: Very crowded airway, Mallampati IV, oral mucosa moist, no thrush. NECK: Supple. No thyromegaly. Trachea midline. No JVD.  No adenopathy. + Pseudo wheeze but no frank stridor. PULMONARY: Good air entry bilaterally.  No true wheezes or rhonchi noted. CARDIOVASCULAR: S1 and S2. Regular rate and rhythm.  No rubs, murmurs or gallops heard. ABDOMEN: Obese, significant truncal obesity, otherwise benign. MUSCULOSKELETAL: No joint deformity, no clubbing, no edema.  NEUROLOGIC: No overt focal deficit, gait not tested, speech is fluent. SKIN: Intact,warm,dry. PSYCH: Calm, cooperative.   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) Noncompliance may aggravate issue Vocal cord  dysfunction Continue bronchodilators Continue pulmonary hygiene, add flutter valve No evidence of infectious process at present Continue IV steroids Continue montelukast Received magnesium sulfate earlier in course Received  Lasix IV x 1 yesterday with improvement on respiratory status for nursing  Increased nasal congestion, nonproductive cough Query sinusitis as trigger Obtain sinus CT  Vocal cord dysfunction versus early stridor Crowded upper airway Soft tissue neck CT  Poorly controlled hypertension Query volume overload Received Lasix x 1 Continue antihypertensives Consider daily diuretic  Gastroesophageal reflux This can aggravate asthma symptoms Continue PPI twice a day Continue antireflux measures  Tobacco dependence due to cigarettes Counseled regards to discontinuation of smoking  Morbid obesity High suspect for obstructive sleep apnea/OHVS Would benefit from weight loss Will need sleep study as outpatient   Labs   CBC: Recent Labs  Lab 03/24/23 1208  WBC 7.9  HGB 13.2  HCT 41.6  MCV 87.9  PLT 401*    Basic Metabolic Panel: Recent Labs  Lab 03/24/23 1208  NA 134*  K 3.9  CL 105  CO2 23  GLUCOSE 104*  BUN 13  CREATININE 0.80  CALCIUM 8.5*   GFR: Estimated Creatinine Clearance: 166.5 mL/min (by C-G formula based on SCr of 0.8 mg/dL). Recent Labs  Lab 03/24/23 1208  WBC 7.9    Liver Function Tests: No results for input(s): "AST", "ALT", "ALKPHOS", "BILITOT", "PROT", "ALBUMIN" in the last 168 hours. No results for input(s): "LIPASE", "AMYLASE" in the last 168 hours. No results for input(s): "AMMONIA" in the last 168 hours.  ABG    Component Value Date/Time   PHART 7.61 (HH) 02/20/2016 0203   PCO2ART 23 (L) 02/20/2016 0203   PO2ART 167 (H) 02/20/2016 0203   HCO3 29.1 (H) 03/18/2023 0507   O2SAT 77.3 03/18/2023 0507     Coagulation Profile: No results for input(s): "INR", "PROTIME" in the last 168 hours.  Cardiac Enzymes: No results for input(s): "CKTOTAL", "CKMB", "CKMBINDEX", "TROPONINI" in the last 168 hours.  HbA1C: Hgb A1c MFr Bld  Date/Time Value Ref Range Status  03/09/2023 04:48 AM 5.8 (H) 4.8 - 5.6 % Final    Comment:    (NOTE) Pre  diabetes:          5.7%-6.4%  Diabetes:              >6.4%  Glycemic control for   <7.0% adults with diabetes     CBG: No results for input(s): "GLUCAP" in the last 168 hours.  Review of Systems:   A 10 point review of systems was performed and it is as noted above otherwise negative.  Past Medical History:  He,  has a past medical history of 2019-nCoV vaccination declined (11/10/2020), Arthritis, Asthma, Hypertension, Morbid obesity (HCC), Shingles, and Tobacco use.   Surgical History:  History reviewed. No pertinent surgical history.   Social History:   reports that he has been smoking cigarettes. He has a 2.5 pack-year smoking history. He has never used smokeless tobacco. He reports current alcohol use. He reports that he does not use drugs.   Family History:  His family history includes Hypertension in his father.   Allergies No Known Allergies   Home Medications  Prior to Admission medications   Medication Sig Start Date End Date Taking? Authorizing Provider  albuterol (VENTOLIN HFA) 108 (90 Base) MCG/ACT inhaler Inhale 2 puffs into the lungs every 4 (four) hours as needed for wheezing or shortness of breath. 02/25/23  Yes Sunnie Nielsen, DO  amLODipine (NORVASC) 5 MG tablet  Take 1 tablet (5 mg total) by mouth daily. 02/25/23 05/26/23 Yes Sunnie Nielsen, DO  loratadine (CLARITIN) 10 MG tablet Take 1 tablet (10 mg total) by mouth daily. 02/26/23  Yes Sunnie Nielsen, DO  montelukast (SINGULAIR) 10 MG tablet Take 1 tablet (10 mg total) by mouth daily. 02/25/23  Yes Sunnie Nielsen, DO  Dextromethorphan-guaiFENesin 20-200 MG/20ML LIQD Take 10 mLs by mouth every 6 (six) hours as needed. 03/18/23   Gillis Santa, MD  fluticasone-salmeterol [facility] INHUB) 250-50 MCG/ACT AEPB Inhale 1 puff into the lungs in the morning and at bedtime. 03/18/23   Gillis Santa, MD  ipratropium-albuterol (DUONEB) 0.5-2.5 (3) MG/3ML SOLN Take 3 mLs by nebulization every 2 (two) hours as  needed (wheezing, shortness of breath). 02/25/23   Sunnie Nielsen, DO  metoprolol tartrate (LOPRESSOR) 50 MG tablet Take 1 tablet (50 mg total) by mouth 2 (two) times daily. Patient not taking: Reported on 03/25/2023 03/10/23 06/08/23  Gillis Santa, MD  nicotine (NICODERM CQ - DOSED IN MG/24 HOURS) 14 mg/24hr patch Place 1 patch (14 mg total) onto the skin daily as needed (nicotine craving). 02/25/23   Sunnie Nielsen, DO  pantoprazole (PROTONIX) 40 MG tablet Take 1 tablet (40 mg total) by mouth daily. 02/26/23   Sunnie Nielsen, DO    Scheduled Meds:  amLODipine  5 mg Oral Daily   budesonide (PULMICORT) nebulizer solution  0.25 mg Nebulization BID   enoxaparin (LOVENOX) injection  68 mg Subcutaneous Q24H   guaiFENesin  600 mg Oral BID   ipratropium-albuterol  3 mL Nebulization Q6H   loratadine  10 mg Oral Daily   methylPREDNISolone (SOLU-MEDROL) injection  60 mg Intravenous Q12H   metoprolol tartrate  50 mg Oral BID   montelukast  10 mg Oral Daily   pantoprazole  40 mg Oral BID   Continuous Infusions: PRN Meds:.acetaminophen **OR** acetaminophen, albuterol, alum & mag hydroxide-simeth, diphenhydrAMINE, HYDROcodone-acetaminophen, nicotine, ondansetron **OR** ondansetron (ZOFRAN) IV, senna-docusate   Level 3 follow-up    I spent 50 minutes of dedicated to the care of this patient on the date of this encounter to include pre-visit review of records, face-to-face time with the patient discussing conditions above, post visit ordering of testing, clinical documentation with the electronic health record, making appropriate referrals as documented, and communicating necessary findings to members of the patients care team.   Gailen Shelter, MD Advanced Bronchoscopy PCCM [facility]    *This note was generated using voice recognition software/Dragon and/or AI transcription program.  Despite best efforts to proofread, errors can occur which can change the meaning. Any  transcriptional errors that result from this process are unintentional and may not be fully corrected at the time of dictation.

## 2023-03-26 NOTE — Progress Notes (Addendum)
 At 1350,pt came to nursing station and said want to go home, and requesting to take off his I/v. This RN  explained importance of hospitalization, but he is not listening and said "I know everything, I need to go home to take care something emergency." AMA paper signed at 1353 pm.Md and charge nurse notified. Peripheral I/v removed.

## 2023-03-26 NOTE — Progress Notes (Signed)
 Progress Note   Patient: Justin Reid GNF:[id number] DOB: 1980/08/09 DOA: 03/24/2023     2 DOS: the patient was seen and examined on 03/26/2023   Brief hospital course:  "Justin Reid is a 43 y.o. male with medical history significant of asthma with multiple recent exacerbations, tobacco use disorder, hypertension, OSA/OHS on CPAP, who presents to the ED due to shortness of breath. Patient was discharged on 03/18/23 after admission for asthma exacerbation.  Pt is extremely short of breath during encounter, therefore detailed history is somewhat limited.  He reports initially feeling better after discharge, but today had return of severe wheezing and dyspnea.  Did not improve with use of his nebulizer treatments at home.  He denies ever requiring intubation for asthma exacerbations in the past, but has had frequent admissions.  He denies being exposed to irritants he may have inhaled.  Denies congestion, fever/chills, sore throat, sick contacts, nausea/vomiting, diarrhea, or other symptoms.   ED course --  Initial vitals -- temp 98.5 F, HR 104, RR 26, BP 143/110, spO2 98% on room air. Labs -- BMP normal except for Na 134, glucose 104, Ca 8.5.  CBC notable for platelets 401k. Respiratory panel negative for Covid, Flu and RSV Imaging --- chest xray no acute findings."   Pt was admitted on 03/24/23 for recurrent severe asthma exacerbation. Started on IV steroids, frequent nebulized bronchodilators. O2 sats stable on room air.    Assessment and Plan:  Acute Asthma Exacerbation, recurrent, frequent Severe persistent asthma ?Vocal Cord dysfunction 3/21 -- pt remains in severe bronchospasm, given 1 hour continuous neb 3/22 -- seen by pulmonology.  Not improving. --Pulmonology consulted - appreciate recommendations --Follow pending CT's of neck soft tissue and sinuses --Continue IV Solu-medrol 60 mg BID --Scheduled Duonebs & PRN Albuterol nebs --Pulmicort nebs --Mucinex --IS and flutter  valve --Continue home antihistamine & Singulair --O2 sats stable on room air, supplement O2 if < 90% on room air  OSA  -- CPAP ordered nightly  GERD  -- on PPI - increased to BID given ? Vocal cord dysfunction  Hypertension --Amlodipine 5 mg daily  Morbid obesity - Body mass index is 44.3 kg/m. Complicates overall care and prognosis.  Recommend lifestyle modifications including physical activity and diet for weight loss and overall long-term health.  Nicotine dependence, Tobacco abuse Patient is aware of the importance of smoking cessation.  He has been utilizing a patch at home. --Nicotine patch ordered       Subjective:  Pt reports no improvement in his shortness of breath.  Slept okay on CPAP.  Dyspnea worse on exertion but currently seated edge of bed using accessory muscles to breath.  Reports sinus congestion but not sinus tenderness, fever or chills.       Physical Exam: Vitals:   03/26/23 0708 03/26/23 0900 03/26/23 1000 03/26/23 1031  BP:  (!) 145/87 (!) 143/106 (!) 140/80  Pulse:  85 81   Resp:  20 19   Temp:  98 F (36.7 C) 98 F (36.7 C)   TempSrc:  Oral Oral   SpO2: 97% 94% 96%   Weight:      Height:       General exam: awake, alert, no acute distress, obese Respiratory system: diminished breath sounds, diffuse high-pitched expiratory wheezes vs stridor heard upper/anterior but today not posteriorly, using accessory muscles to breathe at rest, on room air Cardiovascular system: normal S1/S2, RRR, no pedal edema.   Gastrointestinal system: soft, NT, ND Central nervous system:  A&O x 3. no gross focal neurologic deficits, normal speech Extremities: moves all, no edema, normal tone Skin: dry, intact, normal temperature Psychiatry: normal mood, congruent affect, judgement and insight appear normal   Data Reviewed:  No new labs today  Family Communication: significant other at bedside on rounds  Disposition: Status is: Inpatient Remains inpatient  appropriate because: needs further improvement, severely dypsneic, remains on IV steroids, ongoing evaluation with pulmonology   Planned Discharge Destination: Home    Time spent: 42 minutes  Author: Pennie Banter, DO 03/26/2023 12:49 PM  For on call review [email or web address].

## 2023-03-26 NOTE — Plan of Care (Signed)

## 2023-04-01 ENCOUNTER — Inpatient Hospital Stay
Admission: EM | Admit: 2023-04-01 | Discharge: 2023-04-06 | DRG: 202 | Disposition: A | Discharge status: Home / Self Care | Payer: MEDICAID | Attending: Osteopathic Medicine | Admitting: Osteopathic Medicine

## 2023-04-01 ENCOUNTER — Other Ambulatory Visit: Payer: Self-pay

## 2023-04-01 ENCOUNTER — Encounter: Payer: Self-pay | Admitting: Emergency Medicine

## 2023-04-01 ENCOUNTER — Emergency Department: Payer: MEDICAID

## 2023-04-01 DIAGNOSIS — Z5986 Financial insecurity: Secondary | ICD-10-CM

## 2023-04-01 DIAGNOSIS — F17210 Nicotine dependence, cigarettes, uncomplicated: Secondary | ICD-10-CM | POA: Diagnosis present

## 2023-04-01 DIAGNOSIS — J4551 Severe persistent asthma with (acute) exacerbation: Secondary | ICD-10-CM | POA: Diagnosis not present

## 2023-04-01 DIAGNOSIS — I10 Essential (primary) hypertension: Secondary | ICD-10-CM | POA: Diagnosis present

## 2023-04-01 DIAGNOSIS — Z720 Tobacco use: Secondary | ICD-10-CM | POA: Diagnosis present

## 2023-04-01 DIAGNOSIS — T380X5A Adverse effect of glucocorticoids and synthetic analogues, initial encounter: Secondary | ICD-10-CM | POA: Diagnosis present

## 2023-04-01 DIAGNOSIS — Z6841 Body Mass Index (BMI) 40.0 and over, adult: Secondary | ICD-10-CM

## 2023-04-01 DIAGNOSIS — R0602 Shortness of breath: Secondary | ICD-10-CM | POA: Diagnosis not present

## 2023-04-01 DIAGNOSIS — R739 Hyperglycemia, unspecified: Secondary | ICD-10-CM | POA: Diagnosis present

## 2023-04-01 DIAGNOSIS — Z1152 Encounter for screening for COVID-19: Secondary | ICD-10-CM

## 2023-04-01 DIAGNOSIS — J383 Other diseases of vocal cords: Secondary | ICD-10-CM | POA: Diagnosis present

## 2023-04-01 DIAGNOSIS — E66813 Obesity, class 3: Secondary | ICD-10-CM | POA: Diagnosis present

## 2023-04-01 DIAGNOSIS — J45901 Unspecified asthma with (acute) exacerbation: Secondary | ICD-10-CM | POA: Diagnosis present

## 2023-04-01 DIAGNOSIS — G4733 Obstructive sleep apnea (adult) (pediatric): Secondary | ICD-10-CM | POA: Diagnosis present

## 2023-04-01 DIAGNOSIS — J441 Chronic obstructive pulmonary disease with (acute) exacerbation: Secondary | ICD-10-CM | POA: Diagnosis present

## 2023-04-01 DIAGNOSIS — Z8249 Family history of ischemic heart disease and other diseases of the circulatory system: Secondary | ICD-10-CM

## 2023-04-01 DIAGNOSIS — J9621 Acute and chronic respiratory failure with hypoxia: Secondary | ICD-10-CM | POA: Diagnosis present

## 2023-04-01 DIAGNOSIS — F17200 Nicotine dependence, unspecified, uncomplicated: Secondary | ICD-10-CM | POA: Diagnosis present

## 2023-04-01 DIAGNOSIS — Z716 Tobacco abuse counseling: Secondary | ICD-10-CM

## 2023-04-01 DIAGNOSIS — K219 Gastro-esophageal reflux disease without esophagitis: Secondary | ICD-10-CM | POA: Diagnosis present

## 2023-04-01 DIAGNOSIS — Z5971 Insufficient health insurance coverage: Secondary | ICD-10-CM

## 2023-04-01 DIAGNOSIS — I272 Pulmonary hypertension, unspecified: Secondary | ICD-10-CM | POA: Diagnosis present

## 2023-04-01 DIAGNOSIS — Z7951 Long term (current) use of inhaled steroids: Secondary | ICD-10-CM

## 2023-04-01 DIAGNOSIS — J45909 Unspecified asthma, uncomplicated: Secondary | ICD-10-CM | POA: Diagnosis present

## 2023-04-01 DIAGNOSIS — Z79899 Other long term (current) drug therapy: Secondary | ICD-10-CM

## 2023-04-01 LAB — CBC WITH DIFFERENTIAL/PLATELET
Abs Immature Granulocytes: 0.03 10*3/uL (ref 0.00–0.07)
Basophils Absolute: 0 10*3/uL (ref 0.0–0.1)
Basophils Relative: 0 %
Eosinophils Absolute: 0.3 10*3/uL (ref 0.0–0.5)
Eosinophils Relative: 5 %
HCT: 41.9 % (ref 39.0–52.0)
Hemoglobin: 13.6 g/dL (ref 13.0–17.0)
Immature Granulocytes: 0 %
Lymphocytes Relative: 28 %
Lymphs Abs: 2.1 10*3/uL (ref 0.7–4.0)
MCH: 27.3 pg (ref 26.0–34.0)
MCHC: 32.5 g/dL (ref 30.0–36.0)
MCV: 84 fL (ref 80.0–100.0)
Monocytes Absolute: 0.6 10*3/uL (ref 0.1–1.0)
Monocytes Relative: 8 %
Neutro Abs: 4.3 10*3/uL (ref 1.7–7.7)
Neutrophils Relative %: 59 %
Platelets: 453 10*3/uL — ABNORMAL HIGH (ref 150–400)
RBC: 4.99 MIL/uL (ref 4.22–5.81)
RDW: 14.7 % (ref 11.5–15.5)
WBC: 7.3 10*3/uL (ref 4.0–10.5)
nRBC: 0 % (ref 0.0–0.2)

## 2023-04-01 LAB — BASIC METABOLIC PANEL WITH GFR
Anion gap: 14 (ref 5–15)
BUN: 16 mg/dL (ref 6–20)
CO2: 23 mmol/L (ref 22–32)
Calcium: 9.4 mg/dL (ref 8.9–10.3)
Chloride: 102 mmol/L (ref 98–111)
Creatinine, Ser: 0.95 mg/dL (ref 0.61–1.24)
GFR, Estimated: >60 mL/min (ref >60)
Glucose, Bld: 95 mg/dL (ref 70–99)
Potassium: 4.2 mmol/L (ref 3.5–5.1)
Sodium: 139 mmol/L (ref 135–145)

## 2023-04-01 LAB — TROPONIN I (HIGH SENSITIVITY)
Troponin I (High Sensitivity): 6 ng/L (ref <18)
Troponin I (High Sensitivity): 5 ng/L (ref <18)

## 2023-04-01 LAB — RESP PANEL BY RT-PCR (RSV, FLU A&B, COVID)  RVPGX2
Influenza A by PCR: NEGATIVE
Influenza B by PCR: NEGATIVE
Resp Syncytial Virus by PCR: NEGATIVE
SARS Coronavirus 2 by RT PCR: NEGATIVE

## 2023-04-01 LAB — BRAIN NATRIURETIC PEPTIDE: B Natriuretic Peptide: 11.4 pg/mL (ref 0.0–100.0)

## 2023-04-01 MED ORDER — ONDANSETRON HCL 4 MG/2ML IJ SOLN: 4.0000 mg | Freq: Three times a day (TID) | INTRAMUSCULAR | Status: DC | PRN

## 2023-04-01 MED ORDER — ALBUTEROL SULFATE (2.5 MG/3ML) 0.083% IN NEBU
2.5000 mg | INHALATION_SOLUTION | RESPIRATORY_TRACT | Status: DC | PRN
  Administered 2023-04-01 – 2023-04-03 (×3): 2.5 mg via RESPIRATORY_TRACT
  Filled 2023-04-01 (×3): qty 3

## 2023-04-01 MED ORDER — IPRATROPIUM-ALBUTEROL 0.5-2.5 (3) MG/3ML IN SOLN
3.0000 mL | RESPIRATORY_TRACT | Status: DC
  Administered 2023-04-01 – 2023-04-02 (×7): 3 mL via RESPIRATORY_TRACT
  Filled 2023-04-01 (×7): qty 3

## 2023-04-01 MED ORDER — NICOTINE 21 MG/24HR TD PT24
21.0000 mg | MEDICATED_PATCH | Freq: Every day | TRANSDERMAL | Status: DC
  Administered 2023-04-01 – 2023-04-06 (×6): 21 mg via TRANSDERMAL
  Filled 2023-04-01 (×6): qty 1

## 2023-04-01 MED ORDER — DEXAMETHASONE SODIUM PHOSPHATE 10 MG/ML IJ SOLN
10.0000 mg | Freq: Once | INTRAMUSCULAR | Status: AC
  Administered 2023-04-01: 10 mg via INTRAVENOUS
  Filled 2023-04-01: qty 1

## 2023-04-01 MED ORDER — HYDRALAZINE HCL 20 MG/ML IJ SOLN: 5.0000 mg | INTRAMUSCULAR | Status: DC | PRN

## 2023-04-01 MED ORDER — IPRATROPIUM-ALBUTEROL 0.5-2.5 (3) MG/3ML IN SOLN
3.0000 mL | Freq: Once | RESPIRATORY_TRACT | Status: AC
  Administered 2023-04-01: 3 mL via RESPIRATORY_TRACT
  Filled 2023-04-01: qty 3

## 2023-04-01 MED ORDER — DM-GUAIFENESIN ER 30-600 MG PO TB12
1.0000 | ORAL_TABLET | Freq: Two times a day (BID) | ORAL | Status: DC | PRN
  Administered 2023-04-03: 1 via ORAL
  Filled 2023-04-01: qty 1

## 2023-04-01 MED ORDER — ACETAMINOPHEN 325 MG PO TABS
650.0000 mg | ORAL_TABLET | Freq: Four times a day (QID) | ORAL | Status: DC | PRN
  Administered 2023-04-02 – 2023-04-05 (×5): 650 mg via ORAL
  Filled 2023-04-01 (×5): qty 2

## 2023-04-01 MED ORDER — MAGNESIUM SULFATE 2 GM/50ML IV SOLN
2.0000 g | Freq: Once | INTRAVENOUS | Status: AC
  Administered 2023-04-01: 2 g via INTRAVENOUS
  Filled 2023-04-01: qty 50

## 2023-04-01 MED ORDER — SODIUM CHLORIDE 0.9 % IV BOLUS
1000.0000 mL | Freq: Once | INTRAVENOUS | Status: AC
  Administered 2023-04-01: 1000 mL via INTRAVENOUS

## 2023-04-01 MED ORDER — METHYLPREDNISOLONE SODIUM SUCC 125 MG IJ SOLR
80.0000 mg | Freq: Every day | INTRAMUSCULAR | Status: DC
  Administered 2023-04-02 – 2023-04-04 (×3): 80 mg via INTRAVENOUS
  Filled 2023-04-01 (×3): qty 2

## 2023-04-01 MED ORDER — ENOXAPARIN SODIUM 80 MG/0.8ML IJ SOSY
65.0000 mg | PREFILLED_SYRINGE | INTRAMUSCULAR | Status: DC
  Administered 2023-04-01 – 2023-04-05 (×5): 65 mg via SUBCUTANEOUS
  Filled 2023-04-01: qty 0.8
  Filled 2023-04-01 (×5): qty 0.65
  Filled 2023-04-01: qty 0.8

## 2023-04-01 NOTE — ED Triage Notes (Signed)
 C?o SOB and wheezing since yesterday. Has history of Asthma and has been using inhaler and nebulizer without relief.  AAOx3.  Audible expiratory wheezing noted.
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
## 2023-04-01 NOTE — ED Notes (Addendum)
 Respiratory therapist at bedside. Nurse requests that RT connect adapter to cpap so patient can start his breathing treatment.

## 2023-04-01 NOTE — H&P (Addendum)
 History and Physical    Justin Reid WUJ:[id number] DOB: 01/08/1980 DOA: 04/01/2023  Referring MD/NP/PA:   PCP: Pcp, No   Patient coming from:  The patient is coming from home.     Chief Complaint: SOB  HPI: Justin Reid is a 43 y.o. male with medical history significant of tobacco abuse, asthma, HTN, vocal cord dysfunction, OSA on CPAP, morbid obesity with BMI 44.28, who presents with shortness of breath.  Patient recently has had several admissions due to asthma exacerbation. Patient states that in the past several days, he has worsening SOB, dry cough, wheezing.  No fever or chills.  He reports chest pain, which is located front chest, sharp, moderate, nonradiating, pleuritic, aggravated by deep breath and coughing.  No nausea, vomiting, diarrhea or abdominal pain.  No symptoms of UTI.  Data reviewed independently and ED Course: pt was found to have troponin 6, WBC 7.3, GFR> 60, negative PCR for COVID, flu and RSV.  Temperature normal, blood pressure 144/97, heart rate 108, 94, RR 20, oxygen saturation 88-99% on room air.  Chest x-ray negative.  Patient is placed in telemetry bed of observation.  EKG: I have personally reviewed.  Sinus rhythm, QTc 399, LAD, low voltage.   Review of Systems:   General: no fevers, chills, no body weight gain, has fatigue HEENT: no blurry vision, hearing changes or sore throat Respiratory: has dyspnea, coughing, wheezing CV: has chest pain, no palpitations GI: no nausea, vomiting, abdominal pain, diarrhea, constipation GU: no dysuria, burning on urination, increased urinary frequency, hematuria  Ext: no leg edema Neuro: no unilateral weakness, numbness, or tingling, no vision change or hearing loss Skin: no rash, no skin tear. MSK: No muscle spasm, no deformity, no limitation of range of movement in spin Heme: No easy bruising.  Travel history: No recent long distant travel.   Allergy: No Known Allergies  Past Medical History:   Diagnosis Date   2019-nCoV vaccination declined 11/10/2020   Arthritis    Asthma    Hypertension    Morbid obesity (HCC)    Shingles    Tobacco use     History reviewed. No pertinent surgical history.  Social History:  reports that he has been smoking cigarettes. He has a 2.5 pack-year smoking history. He has never used smokeless tobacco. He reports current alcohol use. He reports that he does not use drugs.  Family History:  Family History  Problem Relation Age of Onset   Hypertension Father      Prior to Admission medications   Medication Sig Start Date End Date Taking? Authorizing Provider  albuterol (VENTOLIN HFA) 108 (90 Base) MCG/ACT inhaler Inhale 2 puffs into the lungs every 4 (four) hours as needed for wheezing or shortness of breath. 02/25/23   Sunnie Nielsen, DO  amLODipine (NORVASC) 5 MG tablet Take 1 tablet (5 mg total) by mouth daily. 02/25/23 05/26/23  Sunnie Nielsen, DO  Dextromethorphan-guaiFENesin 20-200 MG/20ML LIQD Take 10 mLs by mouth every 6 (six) hours as needed. 03/18/23   Gillis Santa, MD  fluticasone-salmeterol [facility] INHUB) 250-50 MCG/ACT AEPB Inhale 1 puff into the lungs in the morning and at bedtime. 03/18/23   Gillis Santa, MD  ipratropium-albuterol (DUONEB) 0.5-2.5 (3) MG/3ML SOLN Take 3 mLs by nebulization every 2 (two) hours as needed (wheezing, shortness of breath). 02/25/23   Sunnie Nielsen, DO  loratadine (CLARITIN) 10 MG tablet Take 1 tablet (10 mg total) by mouth daily. 02/26/23   Sunnie Nielsen, DO  metoprolol tartrate (LOPRESSOR) 50  MG tablet Take 1 tablet (50 mg total) by mouth 2 (two) times daily. 03/10/23 06/08/23  Gillis Santa, MD  montelukast (SINGULAIR) 10 MG tablet Take 1 tablet (10 mg total) by mouth daily. 02/25/23   Sunnie Nielsen, DO  nicotine (NICODERM CQ - DOSED IN MG/24 HOURS) 14 mg/24hr patch Place 1 patch (14 mg total) onto the skin daily as needed (nicotine craving). 02/25/23   Sunnie Nielsen, DO  pantoprazole  (PROTONIX) 40 MG tablet Take 1 tablet (40 mg total) by mouth daily. 02/26/23   Sunnie Nielsen, DO    Physical Exam: Vitals:   04/01/23 2030 04/01/23 2046 04/01/23 2130 04/01/23 2200  BP: (!) 150/95  (!) 144/97   Pulse: 90 91 98 95  Resp: 18 17 (!) 22 (!) 23  Temp:      TempSrc:      SpO2: (!) 88% 95% (!) 88% 92%  Weight:      Height:       General: Not in acute distress HEENT:       Eyes: PERRL, EOMI, no jaundice       ENT: No discharge from the ears and nose, no pharynx injection, no tonsillar enlargement.        Neck: No JVD, no bruit, no mass felt. Heme: No neck lymph node enlargement. Cardiac: S1/S2, RRR, No murmurs, No gallops or rubs. Respiratory: has wheezing bilaterally GI: Soft, nondistended, nontender, no rebound pain, no organomegaly, BS present. GU: No hematuria Ext: No pitting leg edema bilaterally. 1+DP/PT pulse bilaterally. Musculoskeletal: No joint deformities, No joint redness or warmth, no limitation of ROM in spin. Skin: No rashes.  Neuro: Alert, oriented X3, cranial nerves II-XII grossly intact, moves all extremities normally.  Psych: Patient is not psychotic, no suicidal or hemocidal ideation.  Labs on Admission: I have personally reviewed following labs and imaging studies  CBC: Recent Labs  Lab 04/01/23 1610  WBC 7.3  NEUTROABS 4.3  HGB 13.6  HCT 41.9  MCV 84.0  PLT 453*   Basic Metabolic Panel: Recent Labs  Lab 04/01/23 1610  NA 139  K 4.2  CL 102  CO2 23  GLUCOSE 95  BUN 16  CREATININE 0.95  CALCIUM 9.4   GFR: Estimated Creatinine Clearance: 140.1 mL/min (by C-G formula based on SCr of 0.95 mg/dL). Liver Function Tests: No results for input(s): "AST", "ALT", "ALKPHOS", "BILITOT", "PROT", "ALBUMIN" in the last 168 hours. No results for input(s): "LIPASE", "AMYLASE" in the last 168 hours. No results for input(s): "AMMONIA" in the last 168 hours. Coagulation Profile: No results for input(s): "INR", "PROTIME" in the last 168  hours. Cardiac Enzymes: No results for input(s): "CKTOTAL", "CKMB", "CKMBINDEX", "TROPONINI" in the last 168 hours. BNP (last 3 results) No results for input(s): "PROBNP" in the last 8760 hours. HbA1C: No results for input(s): "HGBA1C" in the last 72 hours. CBG: No results for input(s): "GLUCAP" in the last 168 hours. Lipid Profile: No results for input(s): "CHOL", "HDL", "LDLCALC", "TRIG", "CHOLHDL", "LDLDIRECT" in the last 72 hours. Thyroid Function Tests: No results for input(s): "TSH", "T4TOTAL", "FREET4", "T3FREE", "THYROIDAB" in the last 72 hours. Anemia Panel: No results for input(s): "VITAMINB12", "FOLATE", "FERRITIN", "TIBC", "IRON", "RETICCTPCT" in the last 72 hours. Urine analysis: No results found for: "COLORURINE", "APPEARANCEUR", "LABSPEC", "PHURINE", "GLUCOSEU", "HGBUR", "BILIRUBINUR", "KETONESUR", "PROTEINUR", "UROBILINOGEN", "NITRITE", "LEUKOCYTESUR" Sepsis Labs: @LABRCNTIP (procalcitonin:4,lacticidven:4) ) Recent Results (from the past 240 hours)  Resp panel by RT-PCR (RSV, Flu A&B, Covid) Anterior Nasal Swab     Status: None   Collection  Time: 03/24/23  4:45 PM   Specimen: Anterior Nasal Swab  Result Value Ref Range Status   SARS Coronavirus 2 by RT PCR NEGATIVE NEGATIVE Final    Comment: (NOTE) SARS-CoV-2 target nucleic acids are NOT DETECTED.  The SARS-CoV-2 RNA is generally detectable in upper respiratory specimens during the acute phase of infection. The lowest concentration of SARS-CoV-2 viral copies this assay can detect is 138 copies/mL. A negative result does not preclude SARS-Cov-2 infection and should not be used as the sole basis for treatment or other patient management decisions. A negative result may occur with  improper specimen collection/handling, submission of specimen other than nasopharyngeal swab, presence of viral mutation(s) within the areas targeted by this assay, and inadequate number of viral copies(<138 copies/mL). A negative result  must be combined with clinical observations, patient history, and epidemiological information. The expected result is Negative.  Fact Sheet for Patients:  BloggerCourse.com  Fact Sheet for Healthcare Providers:  SeriousBroker.it  This test is no t yet approved or cleared by the [place] FDA and  has been authorized for detection and/or diagnosis of SARS-CoV-2 by FDA under an Emergency Use Authorization (EUA). This EUA will remain  in effect (meaning this test can be used) for the duration of the COVID-19 declaration under Section 564(b)(1) of the Act, 21 U.S.C.section 360bbb-3(b)(1), unless the authorization is terminated  or revoked sooner.       Influenza A by PCR NEGATIVE NEGATIVE Final   Influenza B by PCR NEGATIVE NEGATIVE Final    Comment: (NOTE) The Xpert Xpress SARS-CoV-2/FLU/RSV plus assay is intended as an aid in the diagnosis of influenza from Nasopharyngeal swab specimens and should not be used as a sole basis for treatment. Nasal washings and aspirates are unacceptable for Xpert Xpress SARS-CoV-2/FLU/RSV testing.  Fact Sheet for Patients: BloggerCourse.com  Fact Sheet for Healthcare Providers: SeriousBroker.it  This test is not yet approved or cleared by the [place] FDA and has been authorized for detection and/or diagnosis of SARS-CoV-2 by FDA under an Emergency Use Authorization (EUA). This EUA will remain in effect (meaning this test can be used) for the duration of the COVID-19 declaration under Section 564(b)(1) of the Act, 21 U.S.C. section 360bbb-3(b)(1), unless the authorization is terminated or revoked.     Resp Syncytial Virus by PCR NEGATIVE NEGATIVE Final    Comment: (NOTE) Fact Sheet for Patients: BloggerCourse.com  Fact Sheet for Healthcare Providers: SeriousBroker.it  This test is  not yet approved or cleared by the [place] FDA and has been authorized for detection and/or diagnosis of SARS-CoV-2 by FDA under an Emergency Use Authorization (EUA). This EUA will remain in effect (meaning this test can be used) for the duration of the COVID-19 declaration under Section 564(b)(1) of the Act, 21 U.S.C. section 360bbb-3(b)(1), unless the authorization is terminated or revoked.  Performed at [facility], [address] [place], [place] [address]   Resp panel by RT-PCR (RSV, Flu A&B, Covid) Anterior Nasal Swab     Status: None   Collection Time: 04/01/23  4:32 PM   Specimen: Anterior Nasal Swab  Result Value Ref Range Status   SARS Coronavirus 2 by RT PCR NEGATIVE NEGATIVE Final    Comment: (NOTE) SARS-CoV-2 target nucleic acids are NOT DETECTED.  The SARS-CoV-2 RNA is generally detectable in upper respiratory specimens during the acute phase of infection. The lowest concentration of SARS-CoV-2 viral copies this assay can detect is 138 copies/mL. A negative result does not preclude SARS-Cov-2 infection and  should not be used as the sole basis for treatment or other patient management decisions. A negative result may occur with  improper specimen collection/handling, submission of specimen other than nasopharyngeal swab, presence of viral mutation(s) within the areas targeted by this assay, and inadequate number of viral copies(<138 copies/mL). A negative result must be combined with clinical observations, patient history, and epidemiological information. The expected result is Negative.  Fact Sheet for Patients:  BloggerCourse.com  Fact Sheet for Healthcare Providers:  SeriousBroker.it  This test is no t yet approved or cleared by the [place] FDA and  has been authorized for detection and/or diagnosis of SARS-CoV-2 by FDA under an Emergency Use Authorization (EUA). This EUA will remain  in  effect (meaning this test can be used) for the duration of the COVID-19 declaration under Section 564(b)(1) of the Act, 21 U.S.C.section 360bbb-3(b)(1), unless the authorization is terminated  or revoked sooner.       Influenza A by PCR NEGATIVE NEGATIVE Final   Influenza B by PCR NEGATIVE NEGATIVE Final    Comment: (NOTE) The Xpert Xpress SARS-CoV-2/FLU/RSV plus assay is intended as an aid in the diagnosis of influenza from Nasopharyngeal swab specimens and should not be used as a sole basis for treatment. Nasal washings and aspirates are unacceptable for Xpert Xpress SARS-CoV-2/FLU/RSV testing.  Fact Sheet for Patients: BloggerCourse.com  Fact Sheet for Healthcare Providers: SeriousBroker.it  This test is not yet approved or cleared by the [place] FDA and has been authorized for detection and/or diagnosis of SARS-CoV-2 by FDA under an Emergency Use Authorization (EUA). This EUA will remain in effect (meaning this test can be used) for the duration of the COVID-19 declaration under Section 564(b)(1) of the Act, 21 U.S.C. section 360bbb-3(b)(1), unless the authorization is terminated or revoked.     Resp Syncytial Virus by PCR NEGATIVE NEGATIVE Final    Comment: (NOTE) Fact Sheet for Patients: BloggerCourse.com  Fact Sheet for Healthcare Providers: SeriousBroker.it  This test is not yet approved or cleared by the [place] FDA and has been authorized for detection and/or diagnosis of SARS-CoV-2 by FDA under an Emergency Use Authorization (EUA). This EUA will remain in effect (meaning this test can be used) for the duration of the COVID-19 declaration under Section 564(b)(1) of the Act, 21 U.S.C. section 360bbb-3(b)(1), unless the authorization is terminated or revoked.  Performed at [facility], [address] [place], [place] [address]    Respiratory (~20 pathogens) panel by PCR     Status: None   Collection Time: 04/01/23  7:31 PM   Specimen: Nasopharyngeal Swab; Respiratory  Result Value Ref Range Status   Adenovirus NOT DETECTED NOT DETECTED Final   Coronavirus 229E NOT DETECTED NOT DETECTED Final    Comment: (NOTE) The Coronavirus on the Respiratory Panel, DOES NOT test for the novel  Coronavirus (2019 nCoV)    Coronavirus HKU1 NOT DETECTED NOT DETECTED Final   Coronavirus NL63 NOT DETECTED NOT DETECTED Final   Coronavirus OC43 NOT DETECTED NOT DETECTED Final   Metapneumovirus NOT DETECTED NOT DETECTED Final   Rhinovirus / Enterovirus NOT DETECTED NOT DETECTED Final   Influenza A NOT DETECTED NOT DETECTED Final   Influenza B NOT DETECTED NOT DETECTED Final   Parainfluenza Virus 1 NOT DETECTED NOT DETECTED Final   Parainfluenza Virus 2 NOT DETECTED NOT DETECTED Final   Parainfluenza Virus 3 NOT DETECTED NOT DETECTED Final   Parainfluenza Virus 4 NOT DETECTED NOT DETECTED Final   Respiratory Syncytial Virus NOT  DETECTED NOT DETECTED Final   Bordetella pertussis NOT DETECTED NOT DETECTED Final   Bordetella Parapertussis NOT DETECTED NOT DETECTED Final   Chlamydophila pneumoniae NOT DETECTED NOT DETECTED Final   Mycoplasma pneumoniae NOT DETECTED NOT DETECTED Final    Comment: Performed at [facility], [address], [place], [place] [address]     Radiological Exams on Admission:   Assessment/Plan Principal Problem:   Asthma exacerbation Active Problems:   OSA (obstructive sleep apnea)   Tobacco abuse   HTN (hypertension)   Obesity, Class III, BMI 40-49.9 (morbid obesity) (HCC)   Assessment and Plan:  Asthma exacerbation: Oxygen saturation 88-99% on room air, with intermittently desaturation.  Chest x-ray negative.  Patient reports pleuritic chest pain, need to rule out PE.  -Place in tele bed for obs -Bronchodilators and prn Mucinex -2 g magnesium sulfate -Solu-Medrol 80 mg IV daily after  given 10 g Decadron in ED -Incentive spirometry -check RVP --> negative -Follow-up CTA to rule out PE  OSA (obstructive sleep apnea) -CPAP  Tobacco abuse -Due to counseling about importance of quitting smoking -Nicotine patch  HTN (hypertension): Blood pressure 144/97.  Patient is not taking amlodipine and metoprolol currently. -Restart amlodipine 5 mg daily, metoprolol 50 mg twice daily  Obesity, Class III, BMI 40-49.9 (morbid obesity) (HCC): Body weight 136 kg, BMI 44.28 -Encourage losing weight -Exercise and healthy diet       DVT ppx: SQ Lovenox  Code Status: Full code    Family Communication:     not done, no family member is at bed side.   Disposition Plan:  Anticipate discharge back to previous environment  Consults called:  none  Admission status and Level of care: Telemetry Medical:    for obs      Dispo: The patient is from: Home              Anticipated d/c is to: Home              Anticipated d/c date is: 1 day              Patient currently is not medically stable to d/c.    Severity of Illness:  The appropriate patient status for this patient is OBSERVATION. Observation status is judged to be reasonable and necessary in order to provide the required intensity of service to ensure the patient's safety. The patient's presenting symptoms, physical exam findings, and initial radiographic and laboratory data in the context of their medical condition is felt to place them at decreased risk for further clinical deterioration. Furthermore, it is anticipated that the patient will be medically stable for discharge from the hospital within 2 midnights of admission.        Date of Service 04/02/2023    Lorretta Harp [facility]   If 7PM-7AM, please contact night-coverage [email or web address] 04/02/2023, 1:02 AM

## 2023-04-01 NOTE — ED Notes (Signed)
 See triage note  Presents with SOB  States he has a hx of asthma States he used his SVN this am with min to no relief  Resp labored

## 2023-04-01 NOTE — ED Notes (Signed)
 Patient unhooked from monitor and ambulated to restroom without incident.

## 2023-04-01 NOTE — Consult Note (Signed)
 PHARMACIST - PHYSICIAN COMMUNICATION  CONCERNING:  Enoxaparin (Lovenox) for DVT Prophylaxis    RECOMMENDATION: Patient was prescribed enoxaprin 40mg  q24 hours for VTE prophylaxis.   Filed Weights   04/01/23 1551 04/01/23 1557  Weight: 136 kg (299 lb 13.2 oz) 136 kg (299 lb 13.2 oz)    Body mass index is 44.28 kg/m.  Estimated Creatinine Clearance: 140.1 mL/min (by C-G formula based on SCr of 0.95 mg/dL).   Based on [facility] policy patient is candidate for enoxaparin 0.5mg /kg TBW SQ every 24 hours based on BMI being >30.  DESCRIPTION: Pharmacy has adjusted enoxaparin dose per [facility] policy.  Patient is now receiving enoxaparin 65 mg every 24 hours    Barrie Folk, PharmD Clinical Pharmacist  04/01/2023 8:27 PM

## 2023-04-01 NOTE — ED Notes (Signed)
 RT made aware of pts order for CPAP at bedtime.

## 2023-04-01 NOTE — ED Notes (Signed)
 Respiratory contacted and asked to set up CPAP for patient, made aware that patients 02 sats are dropping while he is asleep.

## 2023-04-02 ENCOUNTER — Observation Stay: Payer: MEDICAID

## 2023-04-02 DIAGNOSIS — J4521 Mild intermittent asthma with (acute) exacerbation: Secondary | ICD-10-CM

## 2023-04-02 LAB — CBC
HCT: 40.8 % (ref 39.0–52.0)
Hemoglobin: 13.2 g/dL (ref 13.0–17.0)
MCH: 27.6 pg (ref 26.0–34.0)
MCHC: 32.4 g/dL (ref 30.0–36.0)
MCV: 85.4 fL (ref 80.0–100.0)
Platelets: 431 10*3/uL — ABNORMAL HIGH (ref 150–400)
RBC: 4.78 MIL/uL (ref 4.22–5.81)
RDW: 14.6 % (ref 11.5–15.5)
WBC: 7.7 10*3/uL (ref 4.0–10.5)
nRBC: 0 % (ref 0.0–0.2)

## 2023-04-02 LAB — BASIC METABOLIC PANEL WITH GFR
Anion gap: 9 (ref 5–15)
BUN: 18 mg/dL (ref 6–20)
CO2: 25 mmol/L (ref 22–32)
Calcium: 9 mg/dL (ref 8.9–10.3)
Chloride: 103 mmol/L (ref 98–111)
Creatinine, Ser: 0.8 mg/dL (ref 0.61–1.24)
GFR, Estimated: >60 mL/min (ref >60)
Glucose, Bld: 132 mg/dL — ABNORMAL HIGH (ref 70–99)
Potassium: 4.8 mmol/L (ref 3.5–5.1)
Sodium: 137 mmol/L (ref 135–145)

## 2023-04-02 LAB — RESPIRATORY PANEL BY PCR

## 2023-04-02 MED ORDER — MOMETASONE FURO-FORMOTEROL FUM 200-5 MCG/ACT IN AERO
2.0000 | INHALATION_SPRAY | Freq: Two times a day (BID) | RESPIRATORY_TRACT | Status: DC
  Administered 2023-04-02 – 2023-04-04 (×6): 2 via RESPIRATORY_TRACT
  Filled 2023-04-02: qty 8.8

## 2023-04-02 MED ORDER — PANTOPRAZOLE SODIUM 40 MG PO TBEC
40.0000 mg | DELAYED_RELEASE_TABLET | Freq: Every day | ORAL | Status: DC
  Administered 2023-04-02 – 2023-04-06 (×5): 40 mg via ORAL
  Filled 2023-04-02 (×5): qty 1

## 2023-04-02 MED ORDER — IOHEXOL 350 MG/ML SOLN
100.0000 mL | Freq: Once | INTRAVENOUS | Status: AC | PRN
  Administered 2023-04-02: 100 mL via INTRAVENOUS

## 2023-04-02 MED ORDER — AMLODIPINE BESYLATE 5 MG PO TABS
ORAL_TABLET | ORAL | Status: AC
  Filled 2023-04-02: qty 1

## 2023-04-02 MED ORDER — AMLODIPINE BESYLATE 5 MG PO TABS
5.0000 mg | ORAL_TABLET | Freq: Every day | ORAL | Status: DC
  Administered 2023-04-02 – 2023-04-06 (×5): 5 mg via ORAL
  Filled 2023-04-02 (×4): qty 1

## 2023-04-02 MED ORDER — DIPHENHYDRAMINE HCL 25 MG PO CAPS
50.0000 mg | ORAL_CAPSULE | Freq: Every day | ORAL | Status: DC
  Administered 2023-04-02: 50 mg via ORAL
  Filled 2023-04-02: qty 2

## 2023-04-02 MED ORDER — METOPROLOL TARTRATE 50 MG PO TABS
50.0000 mg | ORAL_TABLET | Freq: Two times a day (BID) | ORAL | Status: DC
Start: 2023-04-02 — End: 2023-04-06
  Administered 2023-04-02 – 2023-04-06 (×10): 50 mg via ORAL
  Filled 2023-04-02 (×4): qty 1
  Filled 2023-04-02: qty 2
  Filled 2023-04-02 (×2): qty 1
  Filled 2023-04-02: qty 2
  Filled 2023-04-02 (×2): qty 1

## 2023-04-02 NOTE — ED Notes (Signed)
 Patient provided self hygiene supplies per request.

## 2023-04-02 NOTE — ED Notes (Signed)
 Patient ambulated around unit with this nurse and O2 monitoring. Saturations maintained 97% or > on room air. No complaints. Patient back to bed and monitor reapplied.

## 2023-04-02 NOTE — ED Notes (Signed)
 RT added O2 adapter to pt CPAP. Pt on 2L. O2 sat dropped to 54%. RT and provider aware. Pt 91% on 4L

## 2023-04-02 NOTE — ED Notes (Addendum)
 Pt placed on 2L [place] d/t o2 sats dropping to 64%-87% while asleep using CPAP. Respiratory made aware. Provider aware. 02 sats @ 94 % on 2L.

## 2023-04-02 NOTE — ED Notes (Signed)
Sec notified to arrange transport.  

## 2023-04-02 NOTE — ED Notes (Signed)
 Attending notified of patients request to know why on heart healthy diet and not regular compared to previous admissions. Pt provided menu and awaiting further orders.

## 2023-04-02 NOTE — Progress Notes (Signed)
 PROGRESS NOTE  Justin Reid    DOB: [birth year], 43 y.o.  ZOX:[id number]    Code Status: Full Code   DOA: 04/01/2023   LOS: 0   Brief hospital course  Justin Reid is a 43 y.o. male with a PMH significant for  tobacco abuse, asthma, HTN, vocal cord dysfunction, OSA on CPAP, morbid obesity with BMI 44.28, who presents with shortness of breath.   ED Course: troponin 6, WBC 7.3, GFR> 60, negative PCR for COVID, flu and RSV.  Temperature normal, blood pressure 144/97, heart rate 108, 94, RR 20, oxygen saturation 88-99% on room air.  Chest x-ray negative.    EKG: Sinus rhythm, QTc 399, LAD, low voltage.  04/02/23 -weaned to room air at rest but has significant desats when he is sleeping. High suspicion for OSA. No home cpap. Has not had a sleep study.   Assessment & Plan  Principal Problem:   Asthma exacerbation Active Problems:   OSA (obstructive sleep apnea)   Tobacco abuse   HTN (hypertension)   Obesity, Class III, BMI 40-49.9 (morbid obesity) (HCC)  Asthma exacerbation: Oxygen saturation 88-99% on room air, with intermittently desaturation.  Chest x-ray negative.  CTA chest neg PE -Bronchodilators and prn Mucinex - continue steroids -Incentive spirometry -check RVP --> negative - wean to room air - ambulate with pulse ox   OSA- suspected. No history of sleep study. His desaturations occur when he's sleeping. -CPAP at bedtime  - attempt to qualify for  CPAP   Tobacco use -Nicotine patch PRN   HTN- -Restart amlodipine 5 mg daily, metoprolol 50 mg twice daily   Obesity, Class III, BMI 40-49.9 (morbid obesity) (HCC): Body weight 136 kg, BMI 44.28 -Encourage losing weight -Exercise and healthy diet  Body mass index is 44.28 kg/m.  VTE ppx: lovenox  Diet:     Diet   Diet Heart Fluid consistency: Thin   Consultants: None   Subjective 04/02/23    Pt reports feeling improved. No SOB at rest. Has been trying to set up sleep study outpatient but no CPAP at  home   Objective   Vitals:   04/02/23 0600 04/02/23 0615 04/02/23 0630 04/02/23 0815  BP:    (!) 139/101  Pulse: 68 68 64 64  Resp: 16 17 17 15   Temp:      TempSrc:      SpO2: 94% 94% 96% 94%  Weight:      Height:       No intake or output data in the 24 hours ending 04/02/23 0821 Filed Weights   04/01/23 1551 04/01/23 1557  Weight: 136 kg 136 kg     Physical Exam:  General: awake, alert, NAD HEENT: atraumatic, clear conjunctiva, anicteric sclera, MMM, hearing grossly normal Respiratory: normal respiratory effort. Very shallow breaths with no air movement at bases. Significant inspiratory wheezing Cardiovascular: quick capillary refill, normal S1/S2, RRR, no JVD, murmurs Gastrointestinal: soft, NT, ND Nervous: A&O x3. no gross focal neurologic deficits, normal speech Extremities: moves all equally, no edema, normal tone Skin: dry, intact, normal temperature, normal color. No rashes, lesions or ulcers on exposed skin Psychiatry: normal mood, congruent affect  Labs   I have personally reviewed the following labs and imaging studies CBC    Component Value Date/Time   WBC 7.7 04/02/2023 0423   RBC 4.78 04/02/2023 0423   HGB 13.2 04/02/2023 0423   HCT 40.8 04/02/2023 0423   PLT 431 (H) 04/02/2023 0423   MCV 85.4 04/02/2023 0423  MCH 27.6 04/02/2023 0423   MCHC 32.4 04/02/2023 0423   RDW 14.6 04/02/2023 0423   LYMPHSABS 2.1 04/01/2023 1610   MONOABS 0.6 04/01/2023 1610   EOSABS 0.3 04/01/2023 1610   BASOSABS 0.0 04/01/2023 1610      Latest Ref Rng & Units 04/02/2023    4:23 AM 04/01/2023    4:10 PM 03/24/2023   12:08 PM  BMP  Glucose 70 - 99 mg/dL 045  95  409   BUN 6 - 20 mg/dL 18  16  13    Creatinine 0.61 - 1.24 mg/dL 8.11  9.14  7.82   Sodium 135 - 145 mmol/L 137  139  134   Potassium 3.5 - 5.1 mmol/L 4.8  4.2  3.9   Chloride 98 - 111 mmol/L 103  102  105   CO2 22 - 32 mmol/L 25  23  23    Calcium 8.9 - 10.3 mg/dL 9.0  9.4  8.5     CT Angio Chest Pulmonary  Embolism (PE) W or WO Contrast Result Date: 04/02/2023 CLINICAL DATA:  Wheezing and shortness of breath EXAM: CT ANGIOGRAPHY CHEST WITH CONTRAST TECHNIQUE: Multidetector CT imaging of the chest was performed using the standard protocol during bolus administration of intravenous contrast. Multiplanar CT image reconstructions and MIPs were obtained to evaluate the vascular anatomy. RADIATION DOSE REDUCTION: This exam was performed according to the departmental dose-optimization program which includes automated exposure control, adjustment of the mA and/or kV according to patient size and/or use of iterative reconstruction technique. CONTRAST:  OMNIPAQUE IOHEXOL 350 MG/ML SOLN COMPARISON:  Same day chest radiograph and CTA chest 02/21/2023 FINDINGS: Cardiovascular: Negative for acute pulmonary embolism. Normal caliber thoracic aorta. Aberrant right subclavian artery. No pericardial effusion. Mediastinum/Nodes: Trachea and esophagus are unremarkable. No thoracic adenopathy Lungs/Pleura: No focal consolidation, pleural effusion, or pneumothorax. Upper Abdomen: No acute abnormality. Musculoskeletal: No acute fracture. Review of the MIP images confirms the above findings. IMPRESSION: Negative for acute pulmonary embolism. No acute abnormality in the chest. Electronically Signed   By: Minerva Fester M.D.   On: 04/02/2023 02:04   DG Chest 2 View Result Date: 04/01/2023 CLINICAL DATA:  Wheezing and shortness of breath. EXAM: CHEST - 2 VIEW COMPARISON:  Chest x-ray dated March 24, 2023. FINDINGS: The heart size and mediastinal contours are within normal limits. Normal pulmonary vascularity. No focal consolidation, pleural effusion, or pneumothorax. No acute osseous abnormality. Advanced degenerative changes of the left shoulder with multiple glenohumeral intra-articular bodies again noted. IMPRESSION: 1. No acute cardiopulmonary disease. Electronically Signed   By: Obie Dredge M.D.   On: 04/01/2023 17:10     Disposition Plan & Communication  Patient status: Observation  Admitted From: Home Planned disposition location: Home Anticipated discharge date: 3/30 pending respiratory improvement   Family Communication: girlfriend at bedside    Author: Leeroy Bock, DO [facility] 04/02/2023, 8:21 AM   Available by Epic secure chat 7AM-7PM. If 7PM-7AM, please contact night-coverage.  [facility] contact information found on [email or web address].

## 2023-04-03 LAB — BLOOD GAS, ARTERIAL
Acid-base deficit: 0.5 mmol/L (ref 0.0–2.0)
Bicarbonate: 24.2 mmol/L (ref 20.0–28.0)
FIO2: 21 %
O2 Saturation: 97.2 %
Patient temperature: 37
pCO2 arterial: 39 mmHg (ref 32–48)
pH, Arterial: 7.4 (ref 7.35–7.45)
pO2, Arterial: 74 mmHg — ABNORMAL LOW (ref 83–108)

## 2023-04-03 LAB — GLUCOSE, CAPILLARY
Glucose-Capillary: 128 mg/dL — ABNORMAL HIGH (ref 70–99)
Glucose-Capillary: 130 mg/dL — ABNORMAL HIGH (ref 70–99)
Glucose-Capillary: 144 mg/dL — ABNORMAL HIGH (ref 70–99)

## 2023-04-03 LAB — HEMOGLOBIN A1C
Hgb A1c MFr Bld: 6 % — ABNORMAL HIGH (ref 4.8–5.6)
Mean Plasma Glucose: 125.5 mg/dL

## 2023-04-03 MED ORDER — METHOCARBAMOL 500 MG PO TABS
500.0000 mg | ORAL_TABLET | Freq: Once | ORAL | Status: AC
  Administered 2023-04-03: 500 mg via ORAL
  Filled 2023-04-03: qty 1

## 2023-04-03 MED ORDER — DIPHENHYDRAMINE HCL 25 MG PO CAPS
25.0000 mg | ORAL_CAPSULE | Freq: Every evening | ORAL | Status: DC | PRN
  Administered 2023-04-03 – 2023-04-05 (×3): 25 mg via ORAL
  Filled 2023-04-03 (×3): qty 1

## 2023-04-03 MED ORDER — INSULIN ASPART 100 UNIT/ML IJ SOLN
0.0000 [IU] | Freq: Three times a day (TID) | INTRAMUSCULAR | Status: DC
  Administered 2023-04-03: 3 [IU] via SUBCUTANEOUS
  Administered 2023-04-04: 4 [IU] via SUBCUTANEOUS
  Filled 2023-04-03 (×2): qty 1

## 2023-04-03 MED ORDER — IPRATROPIUM-ALBUTEROL 0.5-2.5 (3) MG/3ML IN SOLN
3.0000 mL | RESPIRATORY_TRACT | Status: DC
  Administered 2023-04-03 – 2023-04-05 (×12): 3 mL via RESPIRATORY_TRACT
  Filled 2023-04-03 (×11): qty 3
  Filled 2023-04-03: qty 6

## 2023-04-03 MED ORDER — MORPHINE SULFATE (PF) 2 MG/ML IV SOLN
2.0000 mg | Freq: Once | INTRAVENOUS | Status: AC
  Administered 2023-04-03: 2 mg via INTRAVENOUS
  Filled 2023-04-03: qty 1

## 2023-04-03 MED ORDER — IPRATROPIUM-ALBUTEROL 0.5-2.5 (3) MG/3ML IN SOLN
3.0000 mL | Freq: Three times a day (TID) | RESPIRATORY_TRACT | Status: DC
  Administered 2023-04-03: 3 mL via RESPIRATORY_TRACT

## 2023-04-03 MED ORDER — BUDESONIDE 0.5 MG/2ML IN SUSP
0.5000 mg | Freq: Two times a day (BID) | RESPIRATORY_TRACT | Status: DC
  Administered 2023-04-03 – 2023-04-06 (×7): 0.5 mg via RESPIRATORY_TRACT
  Filled 2023-04-03 (×7): qty 2

## 2023-04-03 MED ORDER — ALBUTEROL SULFATE (2.5 MG/3ML) 0.083% IN NEBU
3.0000 mL | INHALATION_SOLUTION | RESPIRATORY_TRACT | Status: DC | PRN
  Administered 2023-04-06: 3 mL via RESPIRATORY_TRACT
  Filled 2023-04-03: qty 3

## 2023-04-03 MED ORDER — KETOROLAC TROMETHAMINE 30 MG/ML IJ SOLN
30.0000 mg | Freq: Once | INTRAMUSCULAR | Status: AC
  Administered 2023-04-03: 30 mg via INTRAVENOUS
  Filled 2023-04-03: qty 1

## 2023-04-03 NOTE — Progress Notes (Signed)
 PROGRESS NOTE  Justin Reid    DOB: 01-07-1980, 43 y.o.  FAO:[id number]    Code Status: Full Code   DOA: 04/01/2023   LOS: 1   Brief hospital course  Justin Reid is a 43 y.o. male with a PMH significant for  tobacco abuse, asthma, HTN, vocal cord dysfunction, OSA on CPAP, morbid obesity with BMI 44.28, who presents with shortness of breath.   ED Course: troponin 6, WBC 7.3, GFR> 60, negative PCR for COVID, flu and RSV.  Temperature normal, blood pressure 144/97, heart rate 108, 94, RR 20, oxygen saturation 88-99% on room air.  Chest x-ray negative.    EKG: Sinus rhythm, QTc 399, LAD, low voltage.  04/03/23 -had overnight O2 monitoring and am ABG to assess for CPAP eligibility. Respiratory status worse today  Assessment & Plan  Principal Problem:   Asthma exacerbation Active Problems:   Asthma   OSA (obstructive sleep apnea)   Tobacco abuse   HTN (hypertension)   Obesity, Class III, BMI 40-49.9 (morbid obesity) (HCC)  Asthma exacerbation: Chest x-ray negative.  CTA chest neg PE. Airflow greatly decreased with inspiratory wheezing in upper lobes. Significant WOB. On CPAP at beginning of encounter. Removed and can only speak in 1 word sentences -Bronchodilators and prn Mucinex - continue steroids -Incentive spirometry -check RVP --> negative - wean to room air - increased breathing treatments- ICS, bronchodilators.  - trial of bipap, RT consulted.  - transfer to progressive - trial of morphine for air hunger   Hyperglycemia- in setting of steroid use, obesity - hgb A1c - sliding scale   OSA- suspected. No history of sleep study. S/p overnight O2 and morning ABG -CPAP at bedtime  - attempt to qualify for  CPAP, TOC consulted   Tobacco use -Nicotine patch PRN - counseling provided   HTN- -Restart amlodipine 5 mg daily, metoprolol 50 mg twice daily   Obesity, Class III, BMI 40-49.9 (morbid obesity) (HCC): Body weight 136 kg, BMI 44.28 -Encourage losing  weight -Exercise and healthy diet  Body mass index is 44.28 kg/m.  VTE ppx: lovenox  Diet:     Diet   Diet regular Room service appropriate? Yes; Fluid consistency: Thin   Consultants: None   Subjective 04/03/23    Justin Reid reports feeling worse. Cannot speak in full sentences   Objective   Vitals:   04/02/23 2300 04/03/23 0010 04/03/23 0400 04/03/23 0544  BP:   120/70   Pulse:   70   Resp:   18   Temp:   98 F (36.7 C)   TempSrc:   Oral   SpO2: 95% 95% 96% 95%  Weight:      Height:       No intake or output data in the 24 hours ending 04/03/23 0713 Filed Weights   04/01/23 1551 04/01/23 1557  Weight: 136 kg 136 kg     Physical Exam:  General: awake, alert, in mild distress HEENT: atraumatic, clear conjunctiva, anicteric sclera, MMM, hearing grossly normal Respiratory: increased respiratory effort. Very shallow breaths with no air movement at bases. Significant inspiratory wheezing, accessory muscle use. 1 word sentences Cardiovascular: quick capillary refill, normal S1/S2 Nervous: A&O x3. no gross focal neurologic deficits, normal speech Extremities: moves all equally, no edema, normal tone Skin: dry, intact, normal temperature, normal color. No rashes, lesions or ulcers on exposed skin Psychiatry: normal mood, congruent affect  Labs   I have personally reviewed the following labs and imaging studies CBC  Component Value Date/Time   WBC 7.7 04/02/2023 0423   RBC 4.78 04/02/2023 0423   HGB 13.2 04/02/2023 0423   HCT 40.8 04/02/2023 0423   PLT 431 (H) 04/02/2023 0423   MCV 85.4 04/02/2023 0423   MCH 27.6 04/02/2023 0423   MCHC 32.4 04/02/2023 0423   RDW 14.6 04/02/2023 0423   LYMPHSABS 2.1 04/01/2023 1610   MONOABS 0.6 04/01/2023 1610   EOSABS 0.3 04/01/2023 1610   BASOSABS 0.0 04/01/2023 1610      Latest Ref Rng & Units 04/02/2023    4:23 AM 04/01/2023    4:10 PM 03/24/2023   12:08 PM  BMP  Glucose 70 - 99 mg/dL 161  95  096   BUN 6 - 20 mg/dL 18   16  13    Creatinine 0.61 - 1.24 mg/dL 0.45  4.09  8.11   Sodium 135 - 145 mmol/L 137  139  134   Potassium 3.5 - 5.1 mmol/L 4.8  4.2  3.9   Chloride 98 - 111 mmol/L 103  102  105   CO2 22 - 32 mmol/L 25  23  23    Calcium 8.9 - 10.3 mg/dL 9.0  9.4  8.5     CT Angio Chest Pulmonary Embolism (PE) W or WO Contrast Result Date: 04/02/2023 CLINICAL DATA:  Wheezing and shortness of breath EXAM: CT ANGIOGRAPHY CHEST WITH CONTRAST TECHNIQUE: Multidetector CT imaging of the chest was performed using the standard protocol during bolus administration of intravenous contrast. Multiplanar CT image reconstructions and MIPs were obtained to evaluate the vascular anatomy. RADIATION DOSE REDUCTION: This exam was performed according to the departmental dose-optimization program which includes automated exposure control, adjustment of the mA and/or kV according to patient size and/or use of iterative reconstruction technique. CONTRAST:  OMNIPAQUE IOHEXOL 350 MG/ML SOLN COMPARISON:  Same day chest radiograph and CTA chest 02/21/2023 FINDINGS: Cardiovascular: Negative for acute pulmonary embolism. Normal caliber thoracic aorta. Aberrant right subclavian artery. No pericardial effusion. Mediastinum/Nodes: Trachea and esophagus are unremarkable. No thoracic adenopathy Lungs/Pleura: No focal consolidation, pleural effusion, or pneumothorax. Upper Abdomen: No acute abnormality. Musculoskeletal: No acute fracture. Review of the MIP images confirms the above findings. IMPRESSION: Negative for acute pulmonary embolism. No acute abnormality in the chest. Electronically Signed   By: Minerva Fester M.D.   On: 04/02/2023 02:04   DG Chest 2 View Result Date: 04/01/2023 CLINICAL DATA:  Wheezing and shortness of breath. EXAM: CHEST - 2 VIEW COMPARISON:  Chest x-ray dated March 24, 2023. FINDINGS: The heart size and mediastinal contours are within normal limits. Normal pulmonary vascularity. No focal consolidation, pleural effusion,  or pneumothorax. No acute osseous abnormality. Advanced degenerative changes of the left shoulder with multiple glenohumeral intra-articular bodies again noted. IMPRESSION: 1. No acute cardiopulmonary disease. Electronically Signed   By: Obie Dredge M.D.   On: 04/01/2023 17:10    Disposition Plan & Communication  Patient status: Inpatient  Admitted From: Home Planned disposition location: Home Anticipated discharge date: 4/1 pending respiratory improvement   Family Communication: girlfriend at bedside    Author: Leeroy Bock, DO [facility] 04/03/2023, 7:13 AM   Available by Epic secure chat 7AM-7PM. If 7PM-7AM, please contact night-coverage.  [facility] contact information found on [email or web address].

## 2023-04-03 NOTE — Discharge Instructions (Signed)
 You are encouraged to call [facility] and arrange an application appointment to be screened for service to get set up with a physician for primary care  You may print the application and take with you to the appointment. At this web address [email or web address] Please Call [facility] for appointment  [phone]  [address] [place], [place] [address]  Office Hours Monday: Closed Tuesday: 9:00am - 4:00pm Wednesday: 9:00am - 4:00pm Thursday: 9:00am - 8:00pm Friday: Closed  Endocrinology 2nd Thursday of the Month: 5:00pm - 8:00pm  Rheumatology/Orthopedic Clinic By appointment only.  Contact for availability.     Services Not Covered Obstetrics Gastrointestinal/Liver Disease  Rent/Utility/Housing  Agency Name: [facility] Agency Address: [address] [place], [place] [address] Phone: [phone] Email: [email or web address] Website: [email or web address] Service(s) Offered: Housing services, self-sufficiency, congregate meal program, weatherization program, Field seismologist program, emergency food assistance,  housing counseling, home ownership program, wheels -towork program.  Agency Name: [facility] Address: [address] [place], [place] [address] Phone: [phone] (8a-4p) [phone] (8p- 10p) Email: [email or web address] Website: [email or web address] Service(s) Offered: A program for homeless and/or needy men that includes one-on-one counseling, life skills training and job rehabilitation.  Agency Name: [facility] [place] Address: [address] [place], [place] [address] Phone: [phone] Website: www.[email or web address] Service(s) Offered: Assistance to needy in emergency with utility bills, heating fuel, and prescriptions. Shelter for homeless 7pm-7am. April 29, 2016 15  Agency Name:  [facility] [place] (Developmentally Disabled) Address: [address], [place], [place] [address] Phone: [phone] Contact Person: Cathleen Corti Email: [email or web address] Website: [email or web address] Service(s) Offered: Helps individuals with developmental disabilities move from housing that is more restrictive to homes where they  can achieve greater independence and have more  opportunities.  Agency Name: [facility] Address: [address], [place], [place] [address] Phone: [phone] Email: [email or web address] Website: [email or web address].org Service(s) Offered: Provides affordable housing for low-income families, elderly, and disabled individuals. Offer a wide range of  programs and services, from financial planning to afterschool and summer programs.  Agency Name: Department of Social Services Address: [address] [place], [place] [address] Phone: [phone] Service(s) Offered: Child support services; child welfare services; food stamps; Medicaid; work first family assistance; and aid with fuel,  rent, food and medicine.  Agency Name: [facility] [place], [employer]. Address: [facility] [address] [place], [place]  [address] Phone: [phone] Website: www.familyabuseservices.org Service(s) Offered: 24 hour Crisis Line: [phone]; 24 hour Emergency Shelter; Transitional Housing; Support Groups; Scientist, physiological; [employer]; Hispanic Outreach: [phone];  Visitation Center: [phone].  Agency Name: [facility], [place]. Address: [address], [place], [place] [address] Phone: [phone] Service(s) Offered: CAP Services; Home and [employer]; Individual or Group Supports; Respite Care Non-Institutional Nursing;  Residential Supports; Respite Care and Personal Care Services; Transportation; Family and Friends Night; Recreational Activities; Three Nutritious Meals/Snacks;  Consultation with Registered Dietician; Twenty-four hour Registered Nurse Access; Daily and [employer]s; Camp Green Leaves; [place] for the [employer] (During Summer Months) Bingo Night (Every  Wednesday Night); Special Populations Dance Night  (Every Tuesday Night); Professional Hair Care Services.  Agency Name: [facility] Address: [address], [place], [place] [address] Phone: [phone] Contact Person: Jabier Mutton Website: [email or web address].Physicist, medical) Offered: Residential treatment facility for women; food and  clothing, educational & employment development and  transportation to work; Counsellor of financial skills;  parenting and family reunification; emotional and spiritual  support;  transitional housing for program graduates.  Agency Name: [facility] Address: [address], [place], [place] [address] Phone: [phone] Email: [email or web address] Website: [email or web address].es Service(s) Offered: Public housing units for elderly, disabled, and low income people; housing choice vouchers for income eligible  applicants; shelter plus care vouchers; and Psychologist, clinical.  Agency Name: [facility] [employer] Address: [address] [place], [place] [address] Phone: [phone] Email: [email or web address] Website: www.habitatalamance.org Service(s) Offered: Build houses for families in need of decent housing. Each adult in the family must invest 200 hours of labor on  someone else's house, work with volunteers to build their own house, attend classes on budgeting, home maintenance, yard care, and attend homeowner association meetings.  Agency Name: [facility] Address: [address], [place], [place] [address] Phone: [phone] Website: www.rsli.org Service(s) Offered: Intermediate care facilities for intellectually delayed, Supervised Living in group  homes for adults with developmental disabilities, Supervised Living for people who have dual diagnoses (MRMI), Independent Living, Supported Living, respite and a variety of CAP services, pre-vocational services, day supports, and [employer].  Agency Name: [place] [facility] Phone: [phone] Website: www.NCForeclosurePrevention.gov Service(s) Offered: Zero-interest, deferred loans to homeowners struggling to pay their mortgage. Call for more information.  Agency Name: [facility] Agency Address: [address], [place], [place] [address] Phone: [phone] Website: www.alamanceservices.org Service(s) Offered: Housing services, self-sufficiency, congregate meal program, and individual development account program.  Agency Name: [facility] [place] Address: [address], [place], [place] [address] Phone: [phone] Email: [email or web address] Website: www.alliedchurches.org Service(s) Offered: Housing the homeless, feeding the hungry, Company secretary, job and education related services.  Agency Name: [facility] Address: [address] [place], [place] [address] Phone: [phone] Email: [email or web address] Service(s) Offered: Counseling, problem pregnancy, advocacy for Hispanics, limited emergency financial assistance.  Agency Name: Department of Social Services Address: [address] [place], [place] [address] Phone: [phone] Website: [email or web address] Service(s) Offered: Child support services; child welfare services; SNAP; Medicaid; work first family assistance; and aid with fuel,  rent, food and medicine.  Agency Name: Holiday representative Address: [address] [place], [place] [address] Phone: [phone] Email: [email or web address] Service(s) Offered: Family services and transient assistance; emergency food, fuel, clothing,  limited furniture, utilities; budget counseling, general counseling; give a kid a coat; thrift store; Christmas food and toys. Utility assistance, food pantry, rental  assistance, life sustaining medicine   Food Resources  Agency Name: [facility] Agency Address: [address], [place], [place] [address] Phone: [phone] Website: www.[email or web address] Service(s) Offered: Housing services, self-sufficiency, congregate meal program, weatherization program, Event organiser program, emergency food assistance,  housing counseling, home ownership program, wheels - to work program.  Dole Food free for 60 and older at various locations from [employer], Monday-Friday:  [employer], [address] [place], [phone] -[facility], [address] [place] [address], [place] [phone]  -[facility], [address], [place] [phone]  -[address], [place], [phone]  Agency Name: [facility] Address: [phone] [address], [place], [place] [address] Phone: [phone] Website: www.[email or web address] Service(s) Offered: Home delivered hot, frozen, and emergency  meals. Grocery assistance program which matches  volunteers one-on-one with seniors unable to grocery shop  for themselves. Must be 60 years and older; less than 20  hours of in-home aide service, limited or no driving ability;  live alone or with someone with a disability; live in  [place].  Agency Name: [facility]) Address: [address] [place], [place] [address] Phone: [phone] Service(s) Offered: Food is served to shut-ins, homeless, elderly, and low income people in the community every Saturday (11:30 am-12:30 pm) and Sunday (12:30 pm-1:30pm). Volunteers also offer help and encouragement in seeking employment,  and spiritual guidance.  Agency Name: Department of Social  Services Address: [address] [place], [place] [address] Phone: [phone] Service(s) Offered: Child support services; child welfare services; food stamps; Medicaid; work first family assistance; and aid with fuel,  rent, food and medicine.  Agency Name: [facility] Address: [address], [place], [place] Phone: [phone] Website: [email or web address] Services Offered: Monday 10:00am-12:00, 8:00pm-9:00pm, and Friday 10:00am-12:00.  Agency Name: [facility] [place] Address: [address] [place], [place] [address] Phone: [phone] Website: www.[email or web address] Service(s) Offered: Serves weekday meals, open from 11:30 am- 1:00 pm., and 6:30-7:30pm, Monday-Wednesday-Friday distributes food 3:30-6pm, Monday-Wednesday-Friday.  Agency Name: [facility] Address: [address], [place], [place] Phone: [phone] Website: [email or web address] Services Offered: Distributes food the 4th Saturday of the month, starting at 8:00 am  Agency Name: [facility] Address: [address], [place], [place] [address] Phone: [phone] Website: [email or web address] Service(s) Offered: Bread of life, weekly food pantry. Open Wednesdays from 10:00am-noon.  Agency Name: [facility] [employer] [facility] Address: [address] [place], [place], [place] Phone: [phone] Services Offered: Distributes food 9am-1pm, Monday-Thursday. Call for details.  Agency Name: [facility] Address: [address], [place], [place] [address] Phone: [phone] Website: [email or web address] Service(s) Offered: Games developer. Call for assistance.  Agency Name: [facility] Address: [address], [place], [place] [address] Phone: [phone] Service Offered: Emergency Food Pantry. Call for appointment.  Agency Name: [facility] Address: [address] [place], [place] [address] Phone: [phone] Website: [email or web address] Services Offered: Games developer. Call for details  Agency Name: [facility] Address: [address] [place], [place] Phone: [phone] Website: [email or web address] Service(s) Offered: Emergency Food Pantry. Call for details.  Agency Name: Holiday representative Address: [address] [place], [place] [address] Phone: [phone] or [phone] Website: [email or web address] Service(s) Offered: Distribute food 9am-11:30 am, Tuesday-Friday, and 1-3:30pm, Monday-Friday. Food pantry Monday-Friday 1pm-3pm, fresh items, Mon.-Wed.-Fri.  Agency Name: [facility] ([facility]) Address: [address] [place], [place] [address] Phone: [phone] Website: www.[email or web address] Services Offered: Distribute food Tues and Sats from 9:00am-noon. Closed 1st Saturday of each month. Call for details  Agency Name: [facility] Address: [address] [facility] [address] [place] [address] Phone: [phone]  Services Offered: Delivers meals every Thursday  Transportation Resources  Agency Name: [facility] Agency Address: [address] [place], [place] [address] Phone: [phone] Email: [email or web address] Website: www.[email or web address] Service(s) Offered: Housing services, self-sufficiency, congregate meal program, weatherization program, Field seismologist program, emergency food assistance,  housing counseling, home ownership program, wheels-towork program.  Agency Name: [facility] [employer] [phone]) Address: [address], [place], [place] [address] Phone: [phone] Website: [email or web address].acta-[email or web address] Service(s) Offered: Transportation for [employer], subscription and demand response; Dial-a-Ride for citizens 27 years of age or older.  Agency Name: Department of Social Services Address: [address] [place], [place] [address] Phone: [phone] Service(s) Offered: Child support services; child welfare services; food stamps; Medicaid; work first family assistance; and aid with fuel,  rent, food and medicine, transportation assistance.  Agency Name: Disabled [employer] (DAV) Office manager Phone:  [phone] Service(s) Offered: Transports veterans to the [facility] medical center. Call  forty-eight hours in advance and leave the name, telephone  number, date, and time of appointment. Veteran will be  contacted by the driver the day before the appointment to  arrange a pick up point   Transportation Resources  Agency Name: [facility] Agency Address: [address] [place], [place] [address] Phone: [phone] Email: [email or web address] Website: [email or web address] Service(s) Offered: Housing services, self-sufficiency, congregate meal program, weatherization program, Field seismologist program, emergency food assistance,  housing counseling, home ownership program, wheels-towork program.  Agency Name: [facility] [employer] [phone]) Address: [address] [place], [place] [address] Phone: [phone] Website: [email or web address].[email or web address]-[email or web address] Service(s) Offered: Transportation for [employer], subscription and demand response; Dial-a-Ride for citizens 44 years of age or older.  Agency Name: Department of Social Services Address: [address] [place], [place] [address] Phone: [phone] Service(s) Offered: Child support services; child welfare services; food stamps; Medicaid; work first family assistance; and aid with fuel,  rent, food and medicine, transportation assistance.  Agency Name: Disabled [employer] (DAV) Transportation  Network Phone: [phone] Service(s) Offered: Transports veterans to the [facility] medical center. Call  forty-eight hours in advance and  leave the name, telephone  number, date, and time of appointment. Veteran will be  contacted by the driver the day before the appointment to  arrange a pick up point    [employer] ACTA currently provides door to door services. ACTA connects with PART daily for services to [facility]. ACTA also performs contract services to [employer] operates 27 vehicles, all but 3 mini-vans are equipped with lifts for special needs as well as the general public. ACTA drivers are each CDL certified and trained in First Aid and CPR. ACTA was established in 2002 by [employer]. An independent Industrial/product designer. ACTA operates via Cytogeneticist with required Research scientist (physical sciences) from [place]. ACTA provides over 80,000 passenger trips each year, including [facility] and [employer] sites.  Call at least by 11 AM one business day prior to needing transportation  [employer].                      [place], [place] [address]     Office Hours: Monday-Friday  8 AM - 5 PM   Shelters Resource List  [employer] RESCUE MISSION PROVIDED BY: [facility] [address], [place], [place] Offers a faith-based shelter for homeless men, usually with substance use disorders. Residents receive counseling, life skills training, and help finding a job.  HOMELESS SHELTER PROVIDED BY: ALLIED CHURCHES OF [facility] [address] [place], [place], [place] Offers a shelter for men, women, and families experiencing homelessness. Food, clothing and other items are available for residents. Also offers support and services to help residents become self-sufficient. Offers temporary emergency housing for 30 days. Additional shelter may be available when temperatures drop below freezing but is not guaranteed.  FAMILY ABUSE SERVICES OF [facility] COUNTY PROVIDED BY:  FAMILY ABUSE SERVICES OF [facility] [address], [place], [place] Offers services for victims of domestic violence. Offers a 24-hour crisis line and emergency shelter. Offers information and referrals to other community resources. Also offers court advocacy and support groups.  HOUSING CHOICE VOUCHER PROGRAM PROVIDED BY: [facility] - [place] [address], [place], [place] Offers vouchers for approved Section 8  properties. Vouchers offer financial help with rent    FRUIT TREE MINISTRIES PROVIDED BY: [facility], [place], [place] Offers emergency shelter for victims of domestic violence. Also offers a 24-hour crisis hotline for victims of domestic violence, safety planning, information and referrals, case management, and support groups for victims of domestic violence.   ACT TOGETHER EMERGENCY SHELTER PROVIDED BY: [facility] [address], [place], [place] Offers a 21-day emergency shelter for youth experiencing a family crisis, abuse, or homelessness. Case management, supportive services, healthcare services, and more are available for residents. SHELTER PROVIDED BY: [facility] [address], [place], [place] Offers a homeless shelter for people and families. Meals, showers, community referrals, case management, and more are available for residents. HEARTH TRANSITIONAL LIVING PROGRAM PROVIDED BY: [facility] [address], [place], [place] Offers an 4-month homeless shelter for younger adults experiencing homelessness. Case management, independent living skills education, and more are available for residents. PARTNERSHIP VILLAGE PROVIDED BY: [place] [facility] [address], [place], [place] Offers transitional housing for families and single people experiencing homelessness. Residents meet regularly with a case manager to work towards self-sufficiency TRANSITIONAL HOUSING PROVIDED BY: [facility] [address],  [place], [place] Offers transitional housing for male veterans with disabilities. Residents receive meals, transportation, and clothing. Also offers support groups, nutrition classes, and peer support to residents.   WEAVER HOUSE PROVIDED BY: [facility] [address], [place], [place] Offers shelter to adult men and women. Guests receive hot meals and case management. Also offers overnight shelter when temperatures drop during cold winter months  EMERGENCY FAMILY SHELTER PROVIDED BY: [facility] [address], [place], [place] Offers shelter and support services for families experiencing homelessness.

## 2023-04-03 NOTE — Progress Notes (Signed)
 Overnight oximetry completed.  Report in paper chart

## 2023-04-03 NOTE — Plan of Care (Signed)

## 2023-04-03 NOTE — TOC Initial Note (Signed)
 Transition of Care [facility]) - Initial/Assessment Note    Patient Details  Name: Justin Reid MRN: [id number] Date of Birth: 10-21-1980  Transition of Care [facility]) CM/SW Contact:    Liliana Cline, LCSW Phone Number: 04/03/2023, 12:25 PM  Clinical Narrative:                 CSW notified that patient will need a CPAP for home use. CSW spoke with patient. Patient states he is moving and no longer lives at the address on file. Patient is unsure what his new address will be. He provided his mother's address to use for now - [address], [place], [place] [address]. Patient is uninsured. Patient states he would like to use [facility] at [place] - updated MD and care team. Patient states he would like information for [facility] again. CSW added information to be included on the AVS. Patient states he drives and has reliable transportation. CSW reached out to [facility] with [employer] to see if patient can qualify for a home CPAP - awaiting response. Included weekday TOC.  Per MD, likely DC in about 48 hours.  Expected Discharge Plan: Homeless Shelter Barriers to Discharge: Continued Medical Work up   Patient Goals and CMS Choice   CMS Medicare.gov Compare Post Acute Care list provided to:: Patient Choice offered to / list presented to : Patient      Expected Discharge Plan and Services       Living arrangements for the past 2 months: Single Family Home                 DME Arranged: Continuous positive airway pressure (CPAP) DME Agency: [employer] Date DME Agency Contacted: 04/03/23   Representative spoke with at DME Agency: [name]            Prior Living Arrangements/Services Living arrangements for the past 2 months: Single Family Home Lives with:: Spouse Patient language and need for interpreter reviewed:: Yes Do you feel safe going back to the place where you live?: Yes      Need for Family Participation in Patient Care: Yes (Comment) Care giver  support system in place?: Yes (comment) Current home services: DME Criminal Activity/Legal Involvement Pertinent to Current Situation/Hospitalization: No - Comment as needed  Activities of Daily Living      Permission Sought/Granted Permission sought to share information with : Facility Industrial/product designer granted to share information with : Yes, Verbal Permission Granted     Permission granted to share info w AGENCY: [employer], [facility], [facility]        Emotional Assessment       Orientation: : Oriented to Self, Oriented to Place, Oriented to  Time, Oriented to Situation Alcohol / Substance Use: Not Applicable Psych Involvement: No (comment)  Admission diagnosis:  Severe persistent asthma with exacerbation [J45.51] Asthma exacerbation [J45.901] Asthma [J45.909] Patient Active Problem List   Diagnosis Date Noted   Asthma exacerbation 04/01/2023   Vocal cord dysfunction 04/01/2023   Obesity, Class III, BMI 40-49.9 (morbid obesity) (HCC) 02/21/2023   Tobacco use 02/21/2023   Acute respiratory failure with hypoxia (HCC) 01/20/2023   HTN (hypertension) 01/20/2023   Tobacco abuse 12/28/2021   Obesity (BMI 30-39.9) 12/28/2021   OSA (obstructive sleep apnea) 12/28/2021   At risk for sleep apnea 11/10/2020   Obesity hypoventilation syndrome (HCC) 11/10/2020   Truncal obesity 11/10/2020   Asthma 07/07/2016   Acute severe exacerbation of severe persistent asthma 02/20/2016  PCP:  Pcp, No Pharmacy:   [facility] [address]), [place] - [address]) [place] [address] Phone: [phone] Fax: [phone]  [id number] [place], [place] - [address] [place] [address] Phone: [phone] Fax: [phone]  [facility] [address], [place] - [address] [place] [address] Phone: [phone] Fax: [phone]  [facility] -  [facility] [address] [place] [address] Phone: [phone] Fax: [phone]     Social Drivers of Health (SDOH) Social History: SDOH Screenings   Food Insecurity: Patient Declined (04/03/2023)  Recent Concern: Food Insecurity - Food Insecurity Present (03/25/2023)  Housing: Patient Declined (04/03/2023)  Recent Concern: Housing - High Risk (03/25/2023)  Transportation Needs: Patient Declined (04/03/2023)  Utilities: Patient Declined (04/03/2023)  Recent Concern: Utilities - At Risk (03/07/2023)  Social Connections: Unknown (03/25/2023)  Tobacco Use: High Risk (04/01/2023)   SDOH Interventions:     Readmission Risk Interventions    04/03/2023   12:24 PM 03/08/2023    1:02 PM  Readmission Risk Prevention Plan  Transportation Screening Complete Complete  PCP or Specialist Appt within 3-5 Days Complete --  HRI or Home Care Consult Complete Complete  Social Work Consult for Recovery Care Planning/Counseling Complete Complete  Palliative Care Screening Not Applicable Not Applicable  Medication Review Oceanographer) Complete Referral to Pharmacy

## 2023-04-04 ENCOUNTER — Other Ambulatory Visit (HOSPITAL_COMMUNITY): Payer: Self-pay

## 2023-04-04 LAB — GLUCOSE, CAPILLARY
Glucose-Capillary: 86 mg/dL (ref 70–99)
Glucose-Capillary: 123 mg/dL — ABNORMAL HIGH (ref 70–99)
Glucose-Capillary: 161 mg/dL — ABNORMAL HIGH (ref 70–99)
Glucose-Capillary: 139 mg/dL — ABNORMAL HIGH (ref 70–99)

## 2023-04-04 MED ORDER — METHYLPREDNISOLONE SODIUM SUCC 125 MG IJ SOLR: 120.0000 mg | Freq: Two times a day (BID) | INTRAMUSCULAR | Status: DC

## 2023-04-04 MED ORDER — KETOROLAC TROMETHAMINE 30 MG/ML IJ SOLN
30.0000 mg | Freq: Once | INTRAMUSCULAR | Status: AC
  Administered 2023-04-04: 30 mg via INTRAVENOUS
  Filled 2023-04-04: qty 1

## 2023-04-04 MED ORDER — METHOCARBAMOL 500 MG PO TABS
500.0000 mg | ORAL_TABLET | Freq: Once | ORAL | Status: AC
  Administered 2023-04-04: 500 mg via ORAL
  Filled 2023-04-04: qty 1

## 2023-04-04 MED ORDER — ALBUTEROL SULFATE HFA 108 (90 BASE) MCG/ACT IN AERS
1.0000 | INHALATION_SPRAY | RESPIRATORY_TRACT | Status: DC | PRN
  Administered 2023-04-04: 2 via RESPIRATORY_TRACT
  Filled 2023-04-04: qty 6.7

## 2023-04-04 NOTE — Progress Notes (Signed)
 Patient was initially on bipap. Patient c/o difficulty breathing and requested to be placed on cpap. Changed patient over to cpap 10 per setting from previous admissions. Patient now tolerating therapy during my visit.

## 2023-04-04 NOTE — TOC Progression Note (Addendum)
 Transition of Care [facility]) - Progression Note    Patient Details  Name: Justin Reid MRN: [id number] Date of Birth: [birth year]  Transition of Care [facility]) CM/SW Contact  Truddie Hidden, RN Phone Number: 04/04/2023, 12:18 PM  Clinical Narrative:    [name] with [name] from [employer]. Patient would be eligible for CPAP if paid for in total for $450 If patient chooses nocturnal oxygen he would have to have a ONO and pay $180 per month.  Per [name], attempts were made to reach patient unsuccessfully.  MD notified of cost for DME for patient.   3:39pm Spoke with patient and his wife regarding CPAP cost and nocturnal oxygen. Patient wife stated she had applied for medicaid but stated she could not remember the number and did not have the card due to a recent move.   Inquiry made to financial counselor regarding medicaid.   Expected Discharge Plan: Homeless Shelter Barriers to Discharge: Continued Medical Work up  Expected Discharge Plan and Services       Living arrangements for the past 2 months: Single Family Home                 DME Arranged: Continuous positive airway pressure (CPAP) DME Agency: [employer] Date DME Agency Contacted: 04/03/23   Representative spoke with at DME Agency: [name]             Social Determinants of Health (SDOH) Interventions SDOH Screenings   Food Insecurity: Patient Declined (04/03/2023)  Recent Concern: Food Insecurity - Food Insecurity Present (03/25/2023)  Housing: Patient Declined (04/03/2023)  Recent Concern: Housing - High Risk (03/25/2023)  Transportation Needs: Patient Declined (04/03/2023)  Utilities: Patient Declined (04/03/2023)  Recent Concern: Utilities - At Risk (03/07/2023)  Social Connections: Unknown (03/25/2023)  Tobacco Use: High Risk (04/01/2023)    Readmission Risk Interventions    04/03/2023   12:24 PM 03/08/2023    1:02 PM  Readmission Risk Prevention Plan  Transportation Screening Complete Complete  PCP or Specialist Appt  within 3-5 Days Complete --  HRI or Home Care Consult Complete Complete  Social Work Consult for Recovery Care Planning/Counseling Complete Complete  Palliative Care Screening Not Applicable Not Applicable  Medication Review Oceanographer) Complete Referral to Pharmacy

## 2023-04-04 NOTE — Plan of Care (Signed)

## 2023-04-04 NOTE — Plan of Care (Signed)

## 2023-04-04 NOTE — Progress Notes (Signed)
 Patient ambulated in hallway on room air. Oxygen saturation maintained in 90's. Starting out at 97% and dropping to 92%.

## 2023-04-04 NOTE — Progress Notes (Signed)
 PROGRESS NOTE  Justin Reid    DOB: 08/16/1980, 43 y.o.  ZOX:[id number]    Code Status: Full Code   DOA: 04/01/2023   LOS: 2   Brief hospital course  Justin Reid is a 43 y.o. male with a PMH significant for  tobacco abuse, asthma, HTN, vocal cord dysfunction, OSA on CPAP, morbid obesity with BMI 44.28, who presents with shortness of breath.   ED Course: troponin 6, WBC 7.3, GFR> 60, negative PCR for COVID, flu and RSV.  Temperature normal, blood pressure 144/97, heart rate 108, 94, RR 20, oxygen saturation 88-99% on room air.  Chest x-ray negative.    EKG: Sinus rhythm, QTc 399, LAD, low voltage.  04/04/23 -had overnight O2 monitoring and am ABG to assess for CPAP eligibility. Respiratory status worse today. Pulmonology consulted.   Assessment & Plan  Principal Problem:   Asthma exacerbation Active Problems:   Asthma   OSA (obstructive sleep apnea)   Tobacco abuse   HTN (hypertension)   Obesity, Class III, BMI 40-49.9 (morbid obesity) (HCC)  Asthma exacerbation: Chest x-ray negative.  CTA chest neg PE. Airflow greatly decreased with inspiratory wheezing in upper lobes. Significant WOB.  Removed and can only speak in 1 word sentences -Bronchodilators and prn Mucinex - continue steroids -Incentive spirometry -check RVP --> negative - wean to room air - increased breathing treatments- ICS, bronchodilators.  - trial of bipap, RT consulted.  - f/u pulmonology consult  Hyperglycemia- in setting of steroid use, obesity - hgb A1c - sliding scale   OSA- suspected. No history of sleep study. S/p overnight O2 and morning ABG -CPAP at bedtime  - attempt to qualify for  CPAP, TOC consulted   Tobacco use -Nicotine patch PRN - counseling provided   HTN- -Restart amlodipine 5 mg daily, metoprolol 50 mg twice daily   Obesity, Class III, BMI 40-49.9 (morbid obesity) (HCC): Body weight 136 kg, BMI 44.28 -Encourage losing weight -Exercise and healthy diet  Body mass index is  44.28 kg/m.  VTE ppx: lovenox  Diet:     Diet   Diet regular Room service appropriate? Yes; Fluid consistency: Thin   Consultants: None   Subjective 04/04/23    Pt reports poor night sleep. Breathing still labored today.    Objective   Vitals:   04/03/23 1947 04/03/23 1953 04/03/23 2351 04/04/23 0346  BP:  134/88 135/79 134/69  Pulse:  66 71 62  Resp:  20 18 18   Temp:  98.2 F (36.8 C) 98.2 F (36.8 C) 98.2 F (36.8 C)  TempSrc:      SpO2: 99% 95% 99% 100%  Weight:      Height:       No intake or output data in the 24 hours ending 04/04/23 0759 Filed Weights   04/01/23 1551 04/01/23 1557  Weight: 136 kg 136 kg    Physical Exam:  General: awake, alert, in mild distress HEENT: atraumatic, clear conjunctiva, anicteric sclera, MMM, hearing grossly normal Respiratory: increased respiratory effort. Very shallow breaths with no air movement at bases. Significant inspiratory wheezing, accessory muscle use. 1 word sentences Cardiovascular: quick capillary refill, normal S1/S2 Nervous: A&O x3. no gross focal neurologic deficits, normal speech Extremities: moves all equally, no edema, normal tone Skin: dry, intact, normal temperature, normal color. No rashes, lesions or ulcers on exposed skin Psychiatry: normal mood, congruent affect  Labs   I have personally reviewed the following labs and imaging studies CBC    Component Value Date/Time  WBC 7.7 04/02/2023 0423   RBC 4.78 04/02/2023 0423   HGB 13.2 04/02/2023 0423   HCT 40.8 04/02/2023 0423   PLT 431 (H) 04/02/2023 0423   MCV 85.4 04/02/2023 0423   MCH 27.6 04/02/2023 0423   MCHC 32.4 04/02/2023 0423   RDW 14.6 04/02/2023 0423   LYMPHSABS 2.1 04/01/2023 1610   MONOABS 0.6 04/01/2023 1610   EOSABS 0.3 04/01/2023 1610   BASOSABS 0.0 04/01/2023 1610      Latest Ref Rng & Units 04/02/2023    4:23 AM 04/01/2023    4:10 PM 03/24/2023   12:08 PM  BMP  Glucose 70 - 99 mg/dL 161  95  096   BUN 6 - 20 mg/dL 18  16   13    Creatinine 0.61 - 1.24 mg/dL 0.45  4.09  8.11   Sodium 135 - 145 mmol/L 137  139  134   Potassium 3.5 - 5.1 mmol/L 4.8  4.2  3.9   Chloride 98 - 111 mmol/L 103  102  105   CO2 22 - 32 mmol/L 25  23  23    Calcium 8.9 - 10.3 mg/dL 9.0  9.4  8.5     No results found.   Disposition Plan & Communication  Patient status: Inpatient  Admitted From: Home Planned disposition location: Home Anticipated discharge date: 4/2 pending respiratory improvement   Family Communication: girlfriend at bedside    Author: Leeroy Bock, DO [facility] 04/04/2023, 7:59 AM   Available by Epic secure chat 7AM-7PM. If 7PM-7AM, please contact night-coverage.  [facility] contact information found on [email or web address].

## 2023-04-04 NOTE — Consult Note (Signed)
 Pulmonary Critical Care  Initial Consult Note  Justin Reid GNF:[id number] DOB: 07-Mar-1980 DOA: 04/01/2023  Referring physician: Dareen Piano  Chief Complaint:   Asthma  HPI: Justin Reid is a 43 y.o. male  Who was recently admitted and discharged approximately 10 days ago seen by lobe our pulmonary critical care doctor tends all as at that time.  Patient apparently has been having multiple admissions to the ED with exacerbation of asthma.  The patient does not have insurance is not able to afford his medications.  Patient states that he has had a longstanding history of asthma is not on any maintenance medications.  This is now his 3rd admission to the hospital with flareup having been discharged on the 14th of  March and then was readmitted on the 20th of March but patient left it appears is against medical advice on the 22nd of March.  He now returns once again on Friday the 28th with another flare-up.  As far as is history is concern is already mentioned is at longstanding history of asthma the patient has significant allergies.  He is also obese.  Patient also admits to smoking.  He has his usual symptoms of shortness for breath he has symptoms of cough and congestion.  Patient also has been noted by his girlfriend who was in the room to snore and have gasping and difficulty breathing at nighttime.  At the time that I saw him he was on BiPAP.  What he did tell me is that he has financial issues and he currently does not have any Healthcare insurance and that he is not able to afford his medications.  Indeed when he laughed last time he states that he did not get any medications filled so therefore he has been on no maintenance medications.  Review of Systems:  Constitutional:  No weight loss, night sweats, Fevers, chills, fatigue.  HEENT:  No headaches, nasal congestion, post nasal drip,  Cardio-vascular:  No chest pain, Orthopnea, PND, swelling in lower extremities, anasarca, dizziness,  palpitations  GI:  +heartburn, +indigestion, abdominal pain, nausea, vomiting, diarrhea  Resp:   Positive shortness of breath cough exertional dyspnea wheezing Skin:  no rash or lesions.  Musculoskeletal:  No joint pain or swelling.   Remainder ROS performed and is unremarkable other than noted in HPI  Past Medical History:  Diagnosis Date   2019-nCoV vaccination declined 11/10/2020   Arthritis    Asthma    Hypertension    Morbid obesity (HCC)    Shingles    Tobacco use    History reviewed. No pertinent surgical history. Social History:  reports that he has been smoking cigarettes. He has a 2.5 pack-year smoking history. He has never used smokeless tobacco. He reports current alcohol use. He reports that he does not use drugs.  No Known Allergies  Family History  Problem Relation Age of Onset   Hypertension Father     Prior to Admission medications   Medication Sig Start Date End Date Taking? Authorizing Provider  Dextromethorphan-guaiFENesin 20-200 MG/20ML LIQD Take 10 mLs by mouth every 6 (six) hours as needed. 03/18/23  Yes Gillis Santa, MD  fluticasone-salmeterol [facility] INHUB) 250-50 MCG/ACT AEPB Inhale 1 puff into the lungs in the morning and at bedtime. 03/18/23  Yes Gillis Santa, MD  montelukast (SINGULAIR) 10 MG tablet Take 1 tablet (10 mg total) by mouth daily. 02/25/23  Yes Sunnie Nielsen, DO  pantoprazole (PROTONIX) 40 MG tablet Take 1 tablet (40 mg total) by mouth  daily. 02/26/23  Yes Sunnie Nielsen, DO  albuterol (VENTOLIN HFA) 108 (90 Base) MCG/ACT inhaler Inhale 2 puffs into the lungs every 4 (four) hours as needed for wheezing or shortness of breath. 02/25/23   Sunnie Nielsen, DO  amLODipine (NORVASC) 5 MG tablet Take 1 tablet (5 mg total) by mouth daily. Patient not taking: Reported on 04/01/2023 02/25/23 05/26/23  Sunnie Nielsen, DO  ipratropium-albuterol (DUONEB) 0.5-2.5 (3) MG/3ML SOLN Take 3 mLs by nebulization every 2 (two) hours as needed  (wheezing, shortness of breath). 02/25/23   Sunnie Nielsen, DO  loratadine (CLARITIN) 10 MG tablet Take 1 tablet (10 mg total) by mouth daily. 02/26/23   Sunnie Nielsen, DO  metoprolol tartrate (LOPRESSOR) 50 MG tablet Take 1 tablet (50 mg total) by mouth 2 (two) times daily. Patient not taking: Reported on 04/01/2023 03/10/23 06/08/23  Gillis Santa, MD  nicotine (NICODERM CQ - DOSED IN MG/24 HOURS) 14 mg/24hr patch Place 1 patch (14 mg total) onto the skin daily as needed (nicotine craving). 02/25/23   Sunnie Nielsen, DO   Physical Exam: Vitals:   04/04/23 0346 04/04/23 0834 04/04/23 1229 04/04/23 1646  BP: 134/69 138/86 136/84 (!) 136/92  Pulse: 62 60 72 77  Resp: 18     Temp: 98.2 F (36.8 C) (!) 97.5 F (36.4 C) 97.9 F (36.6 C) 98.1 F (36.7 C)  TempSrc:      SpO2: 100% 100% 99% 96%  Weight:      Height:       Body mass index is 44.28 kg/m.   Wt Readings from Last 3 Encounters:  04/01/23 136 kg  03/24/23 136.1 kg  03/07/23 136.1 kg    General:  Appears  anxious Eyes: PERRL, normal lids, irises & conjunctiva ENT: grossly normal hearing, lips & tongue Neck: no LAD, masses or thyromegaly Cardiovascular: RRR, no m/r/g. No LE edema. Respiratory:  diminished with the distal rhonchi       Normal respiratory effort. Abdomen: soft, nontender Skin: no rash or induration seen on limited exam Musculoskeletal: grossly normal tone BUE/BLE Neurologic: grossly non-focal.          Labs on Admission:  Basic Metabolic Panel: Recent Labs  Lab 04/01/23 1610 04/02/23 0423  NA 139 137  K 4.2 4.8  CL 102 103  CO2 23 25  GLUCOSE 95 132*  BUN 16 18  CREATININE 0.95 0.80  CALCIUM 9.4 9.0   Liver Function Tests: No results for input(s): "AST", "ALT", "ALKPHOS", "BILITOT", "PROT", "ALBUMIN" in the last 168 hours. No results for input(s): "LIPASE", "AMYLASE" in the last 168 hours. No results for input(s): "AMMONIA" in the last 168 hours. CBC: Recent Labs  Lab  04/01/23 1610 04/02/23 0423  WBC 7.3 7.7  NEUTROABS 4.3  --   HGB 13.6 13.2  HCT 41.9 40.8  MCV 84.0 85.4  PLT 453* 431*   Cardiac Enzymes: No results for input(s): "CKTOTAL", "CKMB", "CKMBINDEX", "TROPONINI" in the last 168 hours.  BNP (last 3 results) Recent Labs    03/17/23 1252 03/24/23 1208 04/01/23 1610  BNP 6.5 10.6 11.4    ProBNP (last 3 results) No results for input(s): "PROBNP" in the last 8760 hours.  CBG: Recent Labs  Lab 04/03/23 1644 04/03/23 2110 04/04/23 0830 04/04/23 1225 04/04/23 1643  GLUCAP 130* 144* 86 123* 161*    Radiological Exams on Admission: No results found.  EKG: Independently reviewed.  Assessment/Plan Principal Problem:   Asthma exacerbation Active Problems:   Asthma   OSA (obstructive sleep apnea)  Tobacco abuse   HTN (hypertension)   Obesity, Class III, BMI 40-49.9 (morbid obesity) (HCC)    Acute exacerbation of chronic obstructive asthma.  The patient's basic problem is that he cannot afford his medications and therefore is not able to be compliant with recommended treatments.  The patient has now had 3 admissions  Within a short span.  He needs to have some financial assistance would recommend getting social work involved to see if they can assist him with getting medications upon discharge.  In addition to that he needs to be evaluated for Medicaid eligibility.  I think if he is compliant with his medications and he stops smoking his overall symptoms may improve. At present he is on Pulmicort nebs duo nebs and he is on Solu-Medrol 80 mg daily would consider changing that to 60 mg q.6 for now until he is show some improvement and then he can be weaned to oral prednisone.  In addition I saw that he was on [facility] which in the setting of budesonide is duplication of therapy would recommend discontinuing the [facility] for now until he is ready for discharge in addition he is on albuterol MDI as well as albuterol nebs p.r.n. which can be  discontinued as it is duplication of therapy with ipratropium also being present acute 4 hours.   Possible obstructive sleep apnea there is no sleep study that I am aware of having been done to show that he has obstructive sleep apnea however the patient has hypersomnia he has loud snoring a has choking gasping at nighttime which would be sufficient to try to get a outpatient sleep study done at however once again his limitations are going to be that he does not have any type of financial assistance.   GERD this is probably playing a role in exacerbating his cough and his shortness breath and is asthma.  Would recommend aggressive PPI therapy considered twice a day omeprazole if this does not work he might be a candidate for Voquenza but would defer that to outpatient evaluation.   Smoker had a very lengthy conversation with him regarding smoking cessation.  Unfortunately he continues to smoke and I have instructed him clearly to stay away from cigarettes or any other type of inhaled substances such as vaping etc. Or weed. Possible cardiac issues the patient has probably got underlying pulmonary arterial hypertension he does seem to have a enlarged heart on the chest x-ray he probably needs to have a cardiac workup done in addition to get him stabilized.  If there is a cardiac component this may also be worsening his bronchial asthma symptoms .  Furthermore chest CT may show some faint fluid overload  Finally patient needs continuity of care and needs to have an appointment with outpatient pulmonology.   Thank you for consulting in the care of this patient if there is any questions please call me on my cell phone to address.  Code Status:   Full code   Family Communication:  girlfriend in the room  Disposition Plan:  home   Time spent: 69  I have personally obtained a history, examined the patient, evaluated laboratory and imaging results, formulated the assessment and plan and placed orders.  The  Patient requires high complexity decision making for assessment and support. Total Time Spent   Yevonne Pax, MD [facility] Pulmonary Critical Care Medicine Sleep Medicine

## 2023-04-05 LAB — GLUCOSE, CAPILLARY
Glucose-Capillary: 101 mg/dL — ABNORMAL HIGH (ref 70–99)
Glucose-Capillary: 100 mg/dL — ABNORMAL HIGH (ref 70–99)
Glucose-Capillary: 114 mg/dL — ABNORMAL HIGH (ref 70–99)
Glucose-Capillary: 97 mg/dL (ref 70–99)

## 2023-04-05 MED ORDER — IPRATROPIUM-ALBUTEROL 0.5-2.5 (3) MG/3ML IN SOLN
3.0000 mL | Freq: Three times a day (TID) | RESPIRATORY_TRACT | Status: DC
  Administered 2023-04-05 – 2023-04-06 (×4): 3 mL via RESPIRATORY_TRACT
  Filled 2023-04-05 (×5): qty 3

## 2023-04-05 MED ORDER — PREDNISONE 20 MG PO TABS
40.0000 mg | ORAL_TABLET | Freq: Every day | ORAL | Status: DC
  Administered 2023-04-05 – 2023-04-06 (×2): 40 mg via ORAL
  Filled 2023-04-05 (×2): qty 2

## 2023-04-05 MED ORDER — TRAMADOL HCL 50 MG PO TABS
50.0000 mg | ORAL_TABLET | Freq: Once | ORAL | Status: AC
  Administered 2023-04-05: 50 mg via ORAL
  Filled 2023-04-05: qty 1

## 2023-04-05 NOTE — Plan of Care (Signed)

## 2023-04-05 NOTE — Progress Notes (Signed)
 SATURATION QUALIFICATIONS: (This note is used to comply with regulatory documentation for home oxygen)  Patient Saturations on Room Air at Rest = 97%  Patient Saturations on Room Air while Ambulating = 73%  Patient Saturations on 0 Liters of oxygen while Ambulating = 73%  Please briefly explain why patient needs home oxygen: could not complete, pt needed to go back to room.

## 2023-04-05 NOTE — TOC Progression Note (Signed)
 Transition of Care [facility]) - Progression Note    Patient Details  Name: Justin Reid MRN: [id number] Date of Birth: 1980/12/29  Transition of Care [facility]) CM/SW Contact  Marlowe Sax, RN Phone Number: 04/05/2023, 3:33 PM  Clinical Narrative:     Went to the room to speak to the patient, he was not in the room, I asked nursing staff where the patient was, they stated that he is walking with his S/O, I looked all over the unit and did not locate the patient, I also looked on the medical Mall and did not locate the patient, I attempted to call the patient and received a message stating the person with this number is not accepting calls at this time, I called the S/O and received th same message. Unable to discuss plan for DC with the patient  Expected Discharge Plan: Homeless Shelter Barriers to Discharge: Continued Medical Work up  Expected Discharge Plan and Services       Living arrangements for the past 2 months: Single Family Home                 DME Arranged: Continuous positive airway pressure (CPAP) DME Agency: [employer] Date DME Agency Contacted: 04/03/23   Representative spoke with at DME Agency: [name]             Social Determinants of Health (SDOH) Interventions SDOH Screenings   Food Insecurity: Patient Declined (04/03/2023)  Recent Concern: Food Insecurity - Food Insecurity Present (03/25/2023)  Housing: Patient Declined (04/03/2023)  Recent Concern: Housing - High Risk (03/25/2023)  Transportation Needs: Patient Declined (04/03/2023)  Utilities: Patient Declined (04/03/2023)  Recent Concern: Utilities - At Risk (03/07/2023)  Social Connections: Unknown (03/25/2023)  Tobacco Use: High Risk (04/01/2023)    Readmission Risk Interventions    04/03/2023   12:24 PM 03/08/2023    1:02 PM  Readmission Risk Prevention Plan  Transportation Screening Complete Complete  PCP or Specialist Appt within 3-5 Days Complete --  HRI or Home Care Consult Complete Complete   Social Work Consult for Recovery Care Planning/Counseling Complete Complete  Palliative Care Screening Not Applicable Not Applicable  Medication Review Oceanographer) Complete Referral to Pharmacy

## 2023-04-05 NOTE — TOC Progression Note (Signed)
 Transition of Care [facility]) - Progression Note    Patient Details  Name: Justin Reid MRN: [id number] Date of Birth: Apr 07, 1980  Transition [facility]) CM/SW Contact  Truddie Hidden, RN Phone Number: 04/05/2023, 9:12 AM  Clinical Narrative:    Spoke with patient's wife. She inquired about housing resources for patient. She was advised housing resources would be added to patient's AVS.   Spoke with [name] from [employer] regarding patient's pending medicaid number. Patient would not be able to use his medicaid pending number with [employer] until it has been verified as approved. Full payment for the CPAP of $450 or a monthly payment for noctural oxygen at $180 would have to be secured before decive is provided to patient.    Expected Discharge Plan: Homeless Shelter Barriers to Discharge: Continued Medical Work up  Expected Discharge Plan and Services       Living arrangements for the past 2 months: Single Family Home                 DME Arranged: Continuous positive airway pressure (CPAP) DME Agency: [employer] Date DME Agency Contacted: 04/03/23   Representative spoke with at DME Agency: [name]             Social Determinants of Health (SDOH) Interventions SDOH Screenings   Food Insecurity: Patient Declined (04/03/2023)  Recent Concern: Food Insecurity - Food Insecurity Present (03/25/2023)  Housing: Patient Declined (04/03/2023)  Recent Concern: Housing - High Risk (03/25/2023)  Transportation Needs: Patient Declined (04/03/2023)  Utilities: Patient Declined (04/03/2023)  Recent Concern: Utilities - At Risk (03/07/2023)  Social Connections: Unknown (03/25/2023)  Tobacco Use: High Risk (04/01/2023)    Readmission Risk Interventions    04/03/2023   12:24 PM 03/08/2023    1:02 PM  Readmission Risk Prevention Plan  Transportation Screening Complete Complete  PCP or Specialist Appt within 3-5 Days Complete --  HRI or Home Care Consult Complete Complete  Social Work Consult for  Recovery Care Planning/Counseling Complete Complete  Palliative Care Screening Not Applicable Not Applicable  Medication Review Oceanographer) Complete Referral to Pharmacy

## 2023-04-05 NOTE — Progress Notes (Signed)
 PROGRESS NOTE  [name]    DOB: 03-17-1980, 43 y.o.  BJY:[id number]    Code Status: Full Code   DOA: 04/01/2023   LOS: 3   Brief hospital course  Justin Reid is a 42 y.o. male with a PMH significant for  tobacco abuse, asthma, HTN, vocal cord dysfunction, OSA on CPAP, morbid obesity with BMI 44.28, who presents with shortness of breath.   ED Course: troponin 6, WBC 7.3, GFR> 60, negative PCR for COVID, flu and RSV.  Temperature normal, blood pressure 144/97, heart rate 108, 94, RR 20, oxygen saturation 88-99% on room air.  Chest x-ray negative.    EKG: Sinus rhythm, QTc 399, LAD, low voltage.  presently -had overnight O2 monitoring and am ABG to assess for CPAP eligibility. Due to lack of insurance, would have to pay out of pocket for the CPAP and he doesn't feel that he can afford it.  Respiratory status is stable and was able to wean room air with exception of ambulation which showed desats to 70s and requiring O2 again. Pulmonology following.   Assessment & Plan  Principal Problem:   Asthma exacerbation Active Problems:   Asthma   OSA (obstructive sleep apnea)   Tobacco abuse   HTN (hypertension)   Obesity, Class III, BMI 40-49.9 (morbid obesity) (HCC)  Asthma exacerbation: Chest x-ray negative.  CTA chest neg PE. Airflow greatly decreased with inspiratory wheezing in upper lobes but improving. Normal WOB. On [place].  -Bronchodilators and prn Mucinex - continue steroids -Incentive spirometry -check RVP --> negative - wean to room air, ambulate with pulse ox - increased breathing treatments- ICS, bronchodilators.  - bipap PRN - f/u pulmonology consult  Hyperglycemia- in setting of steroid use, obesity, hgb A1c 6.0 - sliding scale   OSA- suspected. No history of sleep study. S/p overnight O2 and morning ABG -CPAP at bedtime, patient has not been adherent with this - attempt to qualify for  CPAP, TOC consulted   Tobacco use -Nicotine patch PRN - counseling  provided   HTN- -Restart amlodipine 5 mg daily, metoprolol 50 mg twice daily   Obesity, Class III, BMI 40-49.9 (morbid obesity) (HCC): Body weight 136 kg, BMI 44.28 -Encourage losing weight -Exercise and healthy diet  Body mass index is 49.28 kg/m.  VTE ppx: lovenox  Diet:     Diet   Diet regular Room service appropriate? Yes; Fluid consistency: Thin   Consultants: Pulmonology   Subjective 04/05/23    Pt reports better sleep last night. Continued dyspnea with exertion. Does not feel he could afford the CPAP cost.    Objective   Vitals:   04/05/23 0433 04/05/23 0446 04/05/23 0721 04/05/23 0721  BP: (!) 135/97 103/60  (!) 152/93  Pulse: 68 (!) 105  64  Resp: 18 16    Temp: (!) 97.5 F (36.4 C) 98.4 F (36.9 C)  97.7 F (36.5 C)  TempSrc:    Oral  SpO2: 99% 90% 100% 100%  Weight:      Height:        Intake/Output Summary (Last 24 hours) at 04/05/2023 0728 Last data filed at 04/04/2023 1500 Gross per 24 hour  Intake 210 ml  Output --  Net 210 ml   Filed Weights   04/01/23 1551 04/01/23 1557 04/04/23 2210  Weight: 136 kg 136 kg (!) 155.8 kg    Physical Exam:  General: awake, alert, no acute distress HEENT: atraumatic, clear conjunctiva, anicteric sclera, MMM, hearing grossly normal Respiratory: normal respiratory  effort. shallow breaths. Significant inspiratory wheezing, no accessory muscle use. full sentences Cardiovascular: quick capillary refill, normal S1/S2 Nervous: A&O x3. no gross focal neurologic deficits, normal speech Extremities: moves all equally, no edema, normal tone Skin: dry, intact, normal temperature, normal color. No rashes, lesions or ulcers on exposed skin Psychiatry: normal mood, congruent affect  Labs   I have personally reviewed the following labs and imaging studies CBC    Component Value Date/Time   WBC 7.7 04/02/2023 0423   RBC 4.78 04/02/2023 0423   HGB 13.2 04/02/2023 0423   HCT 40.8 04/02/2023 0423   PLT 431 (H) 04/02/2023  0423   MCV 85.4 04/02/2023 0423   MCH 27.6 04/02/2023 0423   MCHC 32.4 04/02/2023 0423   RDW 14.6 04/02/2023 0423   LYMPHSABS 2.1 04/01/2023 1610   MONOABS 0.6 04/01/2023 1610   EOSABS 0.3 04/01/2023 1610   BASOSABS 0.0 04/01/2023 1610      Latest Ref Rng & Units 04/02/2023    4:23 AM 04/01/2023    4:10 PM 03/24/2023   12:08 PM  BMP  Glucose 70 - 99 mg/dL 119  95  147   BUN 6 - 20 mg/dL 18  16  13    Creatinine 0.61 - 1.24 mg/dL 8.29  5.62  1.30   Sodium 135 - 145 mmol/L 137  139  134   Potassium 3.5 - 5.1 mmol/L 4.8  4.2  3.9   Chloride 98 - 111 mmol/L 103  102  105   CO2 22 - 32 mmol/L 25  23  23    Calcium 8.9 - 10.3 mg/dL 9.0  9.4  8.5     No results found.   Disposition Plan & Communication  Patient status: Inpatient  Admitted From: Home Planned disposition location: Home Anticipated discharge date: 4/2 pending respiratory improvement   Family Communication: girlfriend at bedside    Author: Leeroy Bock, DO [facility] 04/05/2023, 7:28 AM   Available by Epic secure chat 7AM-7PM. If 7PM-7AM, please contact night-coverage.  [facility] contact information found on [email or web address].

## 2023-04-05 NOTE — Plan of Care (Signed)

## 2023-04-06 ENCOUNTER — Other Ambulatory Visit: Payer: Self-pay

## 2023-04-06 DIAGNOSIS — J4541 Moderate persistent asthma with (acute) exacerbation: Secondary | ICD-10-CM

## 2023-04-06 LAB — GLUCOSE, CAPILLARY
Glucose-Capillary: 85 mg/dL (ref 70–99)
Glucose-Capillary: 123 mg/dL — ABNORMAL HIGH (ref 70–99)

## 2023-04-06 MED ORDER — DEXTROMETHORPHAN-GUAIFENESIN 20-200 MG/20ML PO LIQD: 10.0000 mL | Freq: Four times a day (QID) | ORAL | Status: DC | PRN

## 2023-04-06 MED ORDER — PREDNISONE 20 MG PO TABS
40.0000 mg | ORAL_TABLET | Freq: Every day | ORAL | 0 refills | Status: DC
  Filled 2023-04-06: qty 3, 1d supply, fill #0

## 2023-04-06 MED ORDER — ALBUTEROL SULFATE HFA 108 (90 BASE) MCG/ACT IN AERS
2.0000 | INHALATION_SPRAY | RESPIRATORY_TRACT | 0 refills | Status: DC | PRN
  Filled 2023-04-06: qty 6.7, 17d supply, fill #0

## 2023-04-06 MED ORDER — OXYCODONE HCL 5 MG PO TABS
5.0000 mg | ORAL_TABLET | ORAL | Status: DC | PRN
  Administered 2023-04-06: 5 mg via ORAL
  Filled 2023-04-06: qty 1

## 2023-04-06 MED ORDER — AMLODIPINE BESYLATE 5 MG PO TABS
5.0000 mg | ORAL_TABLET | Freq: Every day | ORAL | 0 refills | Status: DC
  Filled 2023-04-06: qty 30, 30d supply, fill #0

## 2023-04-06 MED ORDER — LORATADINE 10 MG PO TABS
10.0000 mg | ORAL_TABLET | Freq: Every day | ORAL | 0 refills | Status: AC
  Filled 2023-04-06: qty 30, 30d supply, fill #0

## 2023-04-06 MED ORDER — NICOTINE 14 MG/24HR TD PT24
14.0000 mg | MEDICATED_PATCH | Freq: Every day | TRANSDERMAL | 0 refills | Status: DC | PRN
  Filled 2023-04-06: qty 28, 28d supply, fill #0

## 2023-04-06 MED ORDER — PANTOPRAZOLE SODIUM 40 MG PO TBEC
40.0000 mg | DELAYED_RELEASE_TABLET | Freq: Every day | ORAL | 0 refills | Status: DC
  Filled 2023-04-06: qty 30, 30d supply, fill #0

## 2023-04-06 MED ORDER — MONTELUKAST SODIUM 10 MG PO TABS
10.0000 mg | ORAL_TABLET | Freq: Every day | ORAL | 0 refills | Status: DC
  Filled 2023-04-06: qty 30, 30d supply, fill #0

## 2023-04-06 MED ORDER — OXYCODONE HCL 5 MG PO TABS
5.0000 mg | ORAL_TABLET | Freq: Four times a day (QID) | ORAL | 0 refills | Status: DC | PRN
  Filled 2023-04-06: qty 15, 4d supply, fill #0

## 2023-04-06 MED ORDER — METOPROLOL SUCCINATE ER 50 MG PO TB24
50.0000 mg | ORAL_TABLET | Freq: Every day | ORAL | 0 refills | Status: DC
  Filled 2023-04-06: qty 30, 30d supply, fill #0

## 2023-04-06 MED ORDER — ACETAMINOPHEN 325 MG PO TABS: 650.0000 mg | ORAL_TABLET | Freq: Four times a day (QID) | ORAL | Status: AC | PRN

## 2023-04-06 MED ORDER — FLUTICASONE-SALMETEROL 250-50 MCG/ACT IN AEPB
1.0000 | INHALATION_SPRAY | Freq: Two times a day (BID) | RESPIRATORY_TRACT | 0 refills | Status: DC
Start: 2023-04-06 — End: 2023-04-22
  Filled 2023-04-06: qty 60, 30d supply, fill #0

## 2023-04-06 NOTE — Progress Notes (Signed)
SATURATION QUALIFICATIONS: (This note is used to comply with regulatory documentation for home oxygen)  Patient Saturations on Room Air at Rest =99%  Patient Saturations on Room Air while Ambulating =96%  Patient Saturations on 0 Liters of oxygen while Ambulating =96%  Please briefly explain why patient needs home oxygen: 

## 2023-04-06 NOTE — Discharge Summary (Signed)
 Physician Discharge Summary   Patient: Justin Reid MRN: [id number]  DOB: 10-04-1980   Admit:     Date of Admission: 04/01/2023 Admitted from: home   Discharge: Date of discharge: 04/06/23 Disposition:  home Condition at discharge: good  CODE STATUS: FULL CODE     Discharge Physician: Sunnie Nielsen, DO [facility]     PCP: Pcp, No  Recommendations for Outpatient Follow-up:  Follow up with PCP ASAP    Discharge Instructions     Diet - low sodium heart healthy   Complete by: As directed    Increase activity slowly   Complete by: As directed          Discharge Diagnoses: Principal Problem:   Asthma exacerbation Active Problems:   Asthma   OSA (obstructive sleep apnea)   Tobacco abuse   HTN (hypertension)   Obesity, Class III, BMI 40-49.9 (morbid obesity) [facility])      Hospital course / significant events:   HPI: Justin Reid is a 43 y.o. male with a PMH significant for  tobacco abuse, asthma, HTN, vocal cord dysfunction, OSA on CPAP, morbid obesity with BMI 44.28, who presents with shortness of breath.   03/28: admitted to hospitalist service 03/29: weaned to room air at rest but has significant desats when he is sleeping. High suspicion for OSA. No home cpap. Has not had a sleep study.  03/30: had overnight O2 monitoring and am ABG to assess for CPAP eligibility.  03/31: Respiratory status worse today. Pulmonology consulted.  04/01: Would have to pay out of pocket for the CPAP and he doesn't feel that he can afford it. Ambulation, desats to 70s requiring O2 again, pulmonary following. TOC following.  04/02: pt ambulating on room air, no CPAP overnight. TOC arrange for O2 outpatient. Pt wishes for discharge today and states he will pick up meds asap      Consultants:  Pulmonology   Procedures/Surgeries: none      ASSESSMENT & PLAN:   Asthma exacerbation Acute hypoxic respiratory failure on likely chronic hypoxic  respiratory failure Question pulmonary HTN Have excluded pneumonia, PE, respiratory viral infection WIxela + albuterol home use Singulair Claritin  Finish steroids course  O2 at bedtime  Follow outpatient Consider cardio workup   Financial hardship Cannot afford medications Expected to improve if able to get access to appropriate treatment   Hyperglycemia- in setting of steroid use, obesity, hgb A1c 6.0 Follow outpatient    OSA- suspected. No history of sleep study. S/p overnight O2 and morning ABG TOC able to arrange O2 [place] at bedtime    Tobacco use Nicotine patch PRN counseling provided   GERD Protonix  HTN- amlodipine 5 mg daily, metoprolol XR 50 mg daily   Obesity, Class III, BMI 40-49.9 (morbid obesity) (HCC): Body weight 136 kg, BMI 44.28 Encourage losing weight Exercise and healthy diet               Discharge Instructions  Allergies as of 04/06/2023   No Known Allergies      Medication List     STOP taking these medications    ipratropium-albuterol 0.5-2.5 (3) MG/3ML Soln Commonly known as: DUONEB   metoprolol tartrate 50 MG tablet Commonly known as: LOPRESSOR       TAKE these medications    acetaminophen 325 MG tablet Commonly known as: TYLENOL Take 2 tablets (650 mg total) by mouth every 6 (six) hours as needed for mild pain (pain score 1-3) or fever.  albuterol 108 (90 Base) MCG/ACT inhaler Commonly known as: VENTOLIN HFA Inhale 2 puffs into the lungs every 4 (four) hours as needed for wheezing or shortness of breath.   amLODipine 5 MG tablet Commonly known as: NORVASC Take 1 tablet (5 mg total) by mouth daily.   Dextromethorphan-guaiFENesin 20-200 MG/20ML Liqd Take 10 mLs by mouth every 6 (six) hours as needed.   fluticasone-salmeterol 250-50 MCG/ACT Aepb Commonly known as: Wixela Inhub Inhale 1 puff into the lungs in the morning and at bedtime.   loratadine 10 MG tablet Commonly known as: CLARITIN Take 1 tablet (10  mg total) by mouth daily.   metoprolol succinate 50 MG 24 hr tablet Commonly known as: TOPROL-XL Take 1 tablet (50 mg total) by mouth daily. Take with or immediately following a meal.   montelukast 10 MG tablet Commonly known as: SINGULAIR Take 1 tablet (10 mg total) by mouth daily.   nicotine 14 mg/24hr patch Commonly known as: NICODERM CQ - dosed in mg/24 hours Place 1 patch (14 mg total) onto the skin daily as needed (nicotine craving).   oxyCODONE 5 MG immediate release tablet Commonly known as: Oxy IR/ROXICODONE Take 1 tablet (5 mg total) by mouth every 6 (six) hours as needed for moderate pain (pain score 4-6) or severe pain (pain score 7-10).   pantoprazole 40 MG tablet Commonly known as: PROTONIX Take 1 tablet (40 mg total) by mouth daily.   predniSONE 20 MG tablet Commonly known as: DELTASONE Take 2 tablets (40 mg total) by mouth daily with breakfast. Start taking on: April 07, 2023               Durable Medical Equipment  (From admission, onward)           Start     Ordered   04/06/23 1444  For home use only DME oxygen  Once       Question Answer Comment  Length of Need Lifetime   Mode or (Route) Nasal cannula   Liters per Minute 2   Frequency Only at night (stationary unit needed)   Oxygen delivery system Gas      04/06/23 1443   04/02/23 1539  For home use only DME continuous positive airway pressure (CPAP)  Once       Question Answer Comment  Length of Need Lifetime   Patient has OSA or probable OSA Yes   Is the patient currently using CPAP in the home No   Settings Autotitration   Signs and symptoms of probable OSA  (select all that apply) Snoring   Signs and symptoms of probable OSA  (select all that apply) Witnessed apneas   Signs and symptoms of probable OSA  (select all that apply) Gasping during sleep   CPAP supplies needed Mask, headgear, cushions, filters, heated tubing and water chamber   Additional equipment included Heated  humification and supplies      04/02/23 1540              No Known Allergies   Subjective: Pt reports breathing is better today. Was off CPAP overnight but noted that it does help and goal is to get this eventually. He is walking on room air.    Discharge Exam: BP (!) 143/101   Pulse 75   Temp 98 F (36.7 C)   Resp 19   Ht 5\' 10"  (1.778 m)   Wt (!) 155.8 kg   SpO2 96%   BMI 49.28 kg/m  General: Pt is alert,  awake, not in acute distress Cardiovascular: RRR, S1/S2 +, no rubs, no gallops Respiratory: CTA bilaterally, no wheezing, no rhonchi Abdominal: Soft, NT, ND, bowel sounds + Extremities: no edema, no cyanosis     The results of significant diagnostics from this hospitalization (including imaging, microbiology, ancillary and laboratory) are listed below for reference.     Microbiology: Recent Results (from the past 240 hours)  Resp panel by RT-PCR (RSV, Flu A&B, Covid) Anterior Nasal Swab     Status: None   Collection Time: 04/01/23  4:32 PM   Specimen: Anterior Nasal Swab  Result Value Ref Range Status   SARS Coronavirus 2 by RT PCR NEGATIVE NEGATIVE Final    Comment: (NOTE) SARS-CoV-2 target nucleic acids are NOT DETECTED.  The SARS-CoV-2 RNA is generally detectable in upper respiratory specimens during the acute phase of infection. The lowest concentration of SARS-CoV-2 viral copies this assay can detect is 138 copies/mL. A negative result does not preclude SARS-Cov-2 infection and should not be used as the sole basis for treatment or other patient management decisions. A negative result may occur with  improper specimen collection/handling, submission of specimen other than nasopharyngeal swab, presence of viral mutation(s) within the areas targeted by this assay, and inadequate number of viral copies(<138 copies/mL). A negative result must be combined with clinical observations, patient history, and epidemiological information. The expected result  is Negative.  Fact Sheet for Patients:  BloggerCourse.com  Fact Sheet for Healthcare Providers:  SeriousBroker.it  This test is no t yet approved or cleared by the [place] FDA and  has been authorized for detection and/or diagnosis of SARS-CoV-2 by FDA under an Emergency Use Authorization (EUA). This EUA will remain  in effect (meaning this test can be used) for the duration of the COVID-19 declaration under Section 564(b)(1) of the Act, 21 U.S.C.section 360bbb-3(b)(1), unless the authorization is terminated  or revoked sooner.       Influenza A by PCR NEGATIVE NEGATIVE Final   Influenza B by PCR NEGATIVE NEGATIVE Final    Comment: (NOTE) The Xpert Xpress SARS-CoV-2/FLU/RSV plus assay is intended as an aid in the diagnosis of influenza from Nasopharyngeal swab specimens and should not be used as a sole basis for treatment. Nasal washings and aspirates are unacceptable for Xpert Xpress SARS-CoV-2/FLU/RSV testing.  Fact Sheet for Patients: BloggerCourse.com  Fact Sheet for Healthcare Providers: SeriousBroker.it  This test is not yet approved or cleared by the [place] FDA and has been authorized for detection and/or diagnosis of SARS-CoV-2 by FDA under an Emergency Use Authorization (EUA). This EUA will remain in effect (meaning this test can be used) for the duration of the COVID-19 declaration under Section 564(b)(1) of the Act, 21 U.S.C. section 360bbb-3(b)(1), unless the authorization is terminated or revoked.     Resp Syncytial Virus by PCR NEGATIVE NEGATIVE Final    Comment: (NOTE) Fact Sheet for Patients: BloggerCourse.com  Fact Sheet for Healthcare Providers: SeriousBroker.it  This test is not yet approved or cleared by the [place] FDA and has been authorized for detection and/or diagnosis of  SARS-CoV-2 by FDA under an Emergency Use Authorization (EUA). This EUA will remain in effect (meaning this test can be used) for the duration of the COVID-19 declaration under Section 564(b)(1) of the Act, 21 U.S.C. section 360bbb-3(b)(1), unless the authorization is terminated or revoked.  Performed at [facility], [address] [place], [place] [address]   Respiratory (~20 pathogens) panel by PCR     Status: None  Collection Time: 04/01/23  7:31 PM   Specimen: Nasopharyngeal Swab; Respiratory  Result Value Ref Range Status   Adenovirus NOT DETECTED NOT DETECTED Final   Coronavirus 229E NOT DETECTED NOT DETECTED Final    Comment: (NOTE) The Coronavirus on the Respiratory Panel, DOES NOT test for the novel  Coronavirus (2019 nCoV)    Coronavirus HKU1 NOT DETECTED NOT DETECTED Final   Coronavirus NL63 NOT DETECTED NOT DETECTED Final   Coronavirus OC43 NOT DETECTED NOT DETECTED Final   Metapneumovirus NOT DETECTED NOT DETECTED Final   Rhinovirus / Enterovirus NOT DETECTED NOT DETECTED Final   Influenza A NOT DETECTED NOT DETECTED Final   Influenza B NOT DETECTED NOT DETECTED Final   Parainfluenza Virus 1 NOT DETECTED NOT DETECTED Final   Parainfluenza Virus 2 NOT DETECTED NOT DETECTED Final   Parainfluenza Virus 3 NOT DETECTED NOT DETECTED Final   Parainfluenza Virus 4 NOT DETECTED NOT DETECTED Final   Respiratory Syncytial Virus NOT DETECTED NOT DETECTED Final   Bordetella pertussis NOT DETECTED NOT DETECTED Final   Bordetella Parapertussis NOT DETECTED NOT DETECTED Final   Chlamydophila pneumoniae NOT DETECTED NOT DETECTED Final   Mycoplasma pneumoniae NOT DETECTED NOT DETECTED Final    Comment: Performed at [facility], [address] [place], [place] [address]     Labs: BNP (last 3 results) Recent Labs    03/17/23 1252 03/24/23 1208 04/01/23 1610  BNP 6.5 10.6 11.4   Basic Metabolic Panel: Recent Labs  Lab 04/01/23 1610 04/02/23 0423  NA  139 137  K 4.2 4.8  CL 102 103  CO2 23 25  GLUCOSE 95 132*  BUN 16 18  CREATININE 0.95 0.80  CALCIUM 9.4 9.0   Liver Function Tests: No results for input(s): "AST", "ALT", "ALKPHOS", "BILITOT", "PROT", "ALBUMIN" in the last 168 hours. No results for input(s): "LIPASE", "AMYLASE" in the last 168 hours. No results for input(s): "AMMONIA" in the last 168 hours. CBC: Recent Labs  Lab 04/01/23 1610 04/02/23 0423  WBC 7.3 7.7  NEUTROABS 4.3  --   HGB 13.6 13.2  HCT 41.9 40.8  MCV 84.0 85.4  PLT 453* 431*   Cardiac Enzymes: No results for input(s): "CKTOTAL", "CKMB", "CKMBINDEX", "TROPONINI" in the last 168 hours. BNP: Invalid input(s): "POCBNP" CBG: Recent Labs  Lab 04/05/23 1201 04/05/23 1651 04/05/23 2101 04/06/23 0801 04/06/23 1127  GLUCAP 100* 114* 97 85 123*   D-Dimer No results for input(s): "DDIMER" in the last 72 hours. Hgb A1c No results for input(s): "HGBA1C" in the last 72 hours. Lipid Profile No results for input(s): "CHOL", "HDL", "LDLCALC", "TRIG", "CHOLHDL", "LDLDIRECT" in the last 72 hours. Thyroid function studies No results for input(s): "TSH", "T4TOTAL", "T3FREE", "THYROIDAB" in the last 72 hours.  Invalid input(s): "FREET3" Anemia work up No results for input(s): "VITAMINB12", "FOLATE", "FERRITIN", "TIBC", "IRON", "RETICCTPCT" in the last 72 hours. Urinalysis No results found for: "COLORURINE", "APPEARANCEUR", "LABSPEC", "PHURINE", "GLUCOSEU", "HGBUR", "BILIRUBINUR", "KETONESUR", "PROTEINUR", "UROBILINOGEN", "NITRITE", "LEUKOCYTESUR" Sepsis Labs Recent Labs  Lab 04/01/23 1610 04/02/23 0423  WBC 7.3 7.7   Microbiology Recent Results (from the past 240 hours)  Resp panel by RT-PCR (RSV, Flu A&B, Covid) Anterior Nasal Swab     Status: None   Collection Time: 04/01/23  4:32 PM   Specimen: Anterior Nasal Swab  Result Value Ref Range Status   SARS Coronavirus 2 by RT PCR NEGATIVE NEGATIVE Final    Comment: (NOTE) SARS-CoV-2 target nucleic  acids are NOT DETECTED.  The SARS-CoV-2 RNA  is generally detectable in upper respiratory specimens during the acute phase of infection. The lowest concentration of SARS-CoV-2 viral copies this assay can detect is 138 copies/mL. A negative result does not preclude SARS-Cov-2 infection and should not be used as the sole basis for treatment or other patient management decisions. A negative result may occur with  improper specimen collection/handling, submission of specimen other than nasopharyngeal swab, presence of viral mutation(s) within the areas targeted by this assay, and inadequate number of viral copies(<138 copies/mL). A negative result must be combined with clinical observations, patient history, and epidemiological information. The expected result is Negative.  Fact Sheet for Patients:  BloggerCourse.com  Fact Sheet for Healthcare Providers:  SeriousBroker.it  This test is no t yet approved or cleared by the [place] FDA and  has been authorized for detection and/or diagnosis of SARS-CoV-2 by FDA under an Emergency Use Authorization (EUA). This EUA will remain  in effect (meaning this test can be used) for the duration of the COVID-19 declaration under Section 564(b)(1) of the Act, 21 U.S.C.section 360bbb-3(b)(1), unless the authorization is terminated  or revoked sooner.       Influenza A by PCR NEGATIVE NEGATIVE Final   Influenza B by PCR NEGATIVE NEGATIVE Final    Comment: (NOTE) The Xpert Xpress SARS-CoV-2/FLU/RSV plus assay is intended as an aid in the diagnosis of influenza from Nasopharyngeal swab specimens and should not be used as a sole basis for treatment. Nasal washings and aspirates are unacceptable for Xpert Xpress SARS-CoV-2/FLU/RSV testing.  Fact Sheet for Patients: BloggerCourse.com  Fact Sheet for Healthcare Providers: SeriousBroker.it  This  test is not yet approved or cleared by the [place] FDA and has been authorized for detection and/or diagnosis of SARS-CoV-2 by FDA under an Emergency Use Authorization (EUA). This EUA will remain in effect (meaning this test can be used) for the duration of the COVID-19 declaration under Section 564(b)(1) of the Act, 21 U.S.C. section 360bbb-3(b)(1), unless the authorization is terminated or revoked.     Resp Syncytial Virus by PCR NEGATIVE NEGATIVE Final    Comment: (NOTE) Fact Sheet for Patients: BloggerCourse.com  Fact Sheet for Healthcare Providers: SeriousBroker.it  This test is not yet approved or cleared by the [place] FDA and has been authorized for detection and/or diagnosis of SARS-CoV-2 by FDA under an Emergency Use Authorization (EUA). This EUA will remain in effect (meaning this test can be used) for the duration of the COVID-19 declaration under Section 564(b)(1) of the Act, 21 U.S.C. section 360bbb-3(b)(1), unless the authorization is terminated or revoked.  Performed at [facility], [address] [place], [place] [address]   Respiratory (~20 pathogens) panel by PCR     Status: None   Collection Time: 04/01/23  7:31 PM   Specimen: Nasopharyngeal Swab; Respiratory  Result Value Ref Range Status   Adenovirus NOT DETECTED NOT DETECTED Final   Coronavirus 229E NOT DETECTED NOT DETECTED Final    Comment: (NOTE) The Coronavirus on the Respiratory Panel, DOES NOT test for the novel  Coronavirus (2019 nCoV)    Coronavirus HKU1 NOT DETECTED NOT DETECTED Final   Coronavirus NL63 NOT DETECTED NOT DETECTED Final   Coronavirus OC43 NOT DETECTED NOT DETECTED Final   Metapneumovirus NOT DETECTED NOT DETECTED Final   Rhinovirus / Enterovirus NOT DETECTED NOT DETECTED Final   Influenza A NOT DETECTED NOT DETECTED Final   Influenza B NOT DETECTED NOT DETECTED Final   Parainfluenza Virus 1 NOT DETECTED  NOT DETECTED Final  Parainfluenza Virus 2 NOT DETECTED NOT DETECTED Final   Parainfluenza Virus 3 NOT DETECTED NOT DETECTED Final   Parainfluenza Virus 4 NOT DETECTED NOT DETECTED Final   Respiratory Syncytial Virus NOT DETECTED NOT DETECTED Final   Bordetella pertussis NOT DETECTED NOT DETECTED Final   Bordetella Parapertussis NOT DETECTED NOT DETECTED Final   Chlamydophila pneumoniae NOT DETECTED NOT DETECTED Final   Mycoplasma pneumoniae NOT DETECTED NOT DETECTED Final    Comment: Performed at [facility], [address] [place], [place] [address]   Imaging CT Angio Chest Pulmonary Embolism (PE) W or WO Contrast Result Date: 04/02/2023 CLINICAL DATA:  Wheezing and shortness of breath EXAM: CT ANGIOGRAPHY CHEST WITH CONTRAST TECHNIQUE: Multidetector CT imaging of the chest was performed using the standard protocol during bolus administration of intravenous contrast. Multiplanar CT image reconstructions and MIPs were obtained to evaluate the vascular anatomy. RADIATION DOSE REDUCTION: This exam was performed according to the departmental dose-optimization program which includes automated exposure control, adjustment of the mA and/or kV according to patient size and/or use of iterative reconstruction technique. CONTRAST:  OMNIPAQUE IOHEXOL 350 MG/ML SOLN COMPARISON:  Same day chest radiograph and CTA chest 02/21/2023 FINDINGS: Cardiovascular: Negative for acute pulmonary embolism. Normal caliber thoracic aorta. Aberrant right subclavian artery. No pericardial effusion. Mediastinum/Nodes: Trachea and esophagus are unremarkable. No thoracic adenopathy Lungs/Pleura: No focal consolidation, pleural effusion, or pneumothorax. Upper Abdomen: No acute abnormality. Musculoskeletal: No acute fracture. Review of the MIP images confirms the above findings. IMPRESSION: Negative for acute pulmonary embolism. No acute abnormality in the chest. Electronically Signed   By: Minerva Fester M.D.   On:  04/02/2023 02:04   DG Chest 2 View Result Date: 04/01/2023 CLINICAL DATA:  Wheezing and shortness of breath. EXAM: CHEST - 2 VIEW COMPARISON:  Chest x-ray dated March 24, 2023. FINDINGS: The heart size and mediastinal contours are within normal limits. Normal pulmonary vascularity. No focal consolidation, pleural effusion, or pneumothorax. No acute osseous abnormality. Advanced degenerative changes of the left shoulder with multiple glenohumeral intra-articular bodies again noted. IMPRESSION: 1. No acute cardiopulmonary disease. Electronically Signed   By: Obie Dredge M.D.   On: 04/01/2023 17:10      Time coordinating discharge: over 30 minutes  SIGNED:  Sunnie Nielsen DO [facility]

## 2023-04-06 NOTE — Plan of Care (Signed)

## 2023-04-06 NOTE — Plan of Care (Signed)

## 2023-04-06 NOTE — TOC Progression Note (Signed)
 Transition of Care [facility]) - Progression Note    Patient Details  Name: Justin Reid MRN: [id number] Date of Birth: [birth year]  Transition of Care [facility]) CM/SW Contact  Marlowe Sax, RN Phone Number: 04/06/2023, 1:25 PM  Clinical Narrative:    Met with the patient and his girlfriend in the room, they report that they  can get the oxygen set p at [address] [place] [address] I reached out to [name] at [employer] and requested it, LOG was completed and sent to [place] brooks and [place] [employer] [employer] to get completed and sent to [employer] to cver the Nocturnal Oxygen at 180 per month  The patient's GF will transport at DC  Expected Discharge Plan: Homeless Shelter Barriers to Discharge: Continued Medical Work up  Expected Discharge Plan and Services       Living arrangements for the past 2 months: Single Family Home                 DME Arranged: Continuous positive airway pressure (CPAP) DME Agency: [employer] Date DME Agency Contacted: 04/03/23   Representative spoke with at DME Agency: [name]             Social Determinants of Health (SDOH) Interventions SDOH Screenings   Food Insecurity: Patient Declined (04/03/2023)  Recent Concern: Food Insecurity - Food Insecurity Present (03/25/2023)  Housing: Patient Declined (04/03/2023)  Recent Concern: Housing - High Risk (03/25/2023)  Transportation Needs: Patient Declined (04/03/2023)  Utilities: Patient Declined (04/03/2023)  Recent Concern: Utilities - At Risk (03/07/2023)  Social Connections: Unknown (03/25/2023)  Tobacco Use: High Risk (04/01/2023)    Readmission Risk Interventions    04/03/2023   12:24 PM 03/08/2023    1:02 PM  Readmission Risk Prevention Plan  Transportation Screening Complete Complete  PCP or Specialist Appt within 3-5 Days Complete --  HRI or Home Care Consult Complete Complete  Social Work Consult for Recovery Care Planning/Counseling Complete Complete  Palliative Care Screening Not Applicable Not  Applicable  Medication Review Oceanographer) Complete Referral to Pharmacy

## 2023-04-06 NOTE — Hospital Course (Signed)
 Hospital course / significant events:   HPI: Justin Reid is a 43 y.o. male with a PMH significant for  tobacco abuse, asthma, HTN, vocal cord dysfunction, OSA on CPAP, morbid obesity with BMI 44.28, who presents with shortness of breath.   03/28: admitted to hospitalist service 03/29: weaned to room air at rest but has significant desats when he is sleeping. High suspicion for OSA. No home cpap. Has not had a sleep study.  03/30: had overnight O2 monitoring and am ABG to assess for CPAP eligibility.  03/31: Respiratory status worse today. Pulmonology consulted.  04/01: Would have to pay out of pocket for the CPAP and he doesn't feel that he can afford it. Ambulation, desats to 70s requiring O2 again, pulmonary following. TOC following.  04/02: ***     Consultants:  Pulmonology   Procedures/Surgeries: none      ASSESSMENT & PLAN:   Asthma exacerbation Acute hypoxic respiratory failure on likely chronic hypoxic respiratory failure Have excluded pneumonia, PE, respiratory viral infection -Bronchodilators  prn Mucinex -Incentive spirometry - wean to room air, ambulate with pulse ox ICS, bronchodilators.  - bipap PRN - f/u pulmonology consult Pulmicort nebs, (dulera on discharge)  Duonebs (albuterol on discharge)   Solu-Medrol 80 mg daily would consider changing that to 60 mg q.6 for now until he is show some improvement and then he can be weaned to oral prednisone  Financial hardship Cannot afford medications Expected to improve if able to get access to appropriate treatment    Hyperglycemia- in setting of steroid use, obesity, hgb A1c 6.0 - sliding scale   OSA- suspected. No history of sleep study. S/p overnight O2 and morning ABG -CPAP at bedtime, patient has not been adherent with this - attempt to qualify for  CPAP, TOC consulted   Tobacco use -Nicotine patch PRN - counseling provided   GERD Protonix  HTN- -Restart amlodipine 5 mg daily, metoprolol 50  mg twice daily   Obesity, Class III, BMI 40-49.9 (morbid obesity) (HCC): Body weight 136 kg, BMI 44.28 -Encourage losing weight -Exercise and healthy diet   Possible cardiac issues the patient has probably got underlying pulmonary arterial hypertension he does seem to have a enlarged heart on the chest x-ray he probably needs to have a cardiac workup done in addition to get him stabilized. If there is a cardiac component this may also be worsening his bronchial asthma symptoms . Furthermore chest CT may show some faint fluid overload     *** based on BMI: Body mass index is 49.28 kg/m.  Underweight - under 18  overweight - 25 to 29 obese - 30 or more Class 1 obesity: BMI of 30.0 to 34 Class 2 obesity: BMI of 35.0 to 39 Class 3 obesity: BMI of 40.0 to 49 Super Morbid Obesity: BMI 50-59 Super-super Morbid Obesity: BMI 60+ Significantly low or high BMI is associated with higher medical risk.  Weight management advised as adjunct to other disease management and risk reduction treatments    DVT prophylaxis: *** IV fluids: *** continuous IV fluids  Nutrition: *** Central lines / other devices: ***  Code Status: *** ACP documentation reviewed: *** none on file in [facility]  TOC needs: *** Medical barriers to dispo: ***. Expected medical readiness for discharge ***.

## 2023-04-07 ENCOUNTER — Other Ambulatory Visit: Payer: Self-pay

## 2023-04-07 IMAGING — CT CT ANGIO CHEST
2 of 6 series · 18 of 46 positions shown · IV contrast (APPLIED)
Comparison: Chest radiograph dated 05/08/2020 and 11/10/2020.

CLINICAL DATA: Concern for pulmonary embolism.

EXAM:
CT ANGIOGRAPHY CHEST WITH CONTRAST
TECHNIQUE: Multidetector CT imaging of the chest was performed using the
standard protocol during bolus administration of intravenous
contrast. Multiplanar CT image reconstructions and MIPs were
obtained to evaluate the vascular anatomy.
CONTRAST:  75mL OMNIPAQUE IOHEXOL 350 MG/ML SOLN

[Series 5: thins · axial · 0.79mm/px · z∈[-358,-77]mm · 16 of 309 slices shown]
[im 14/309  lung]
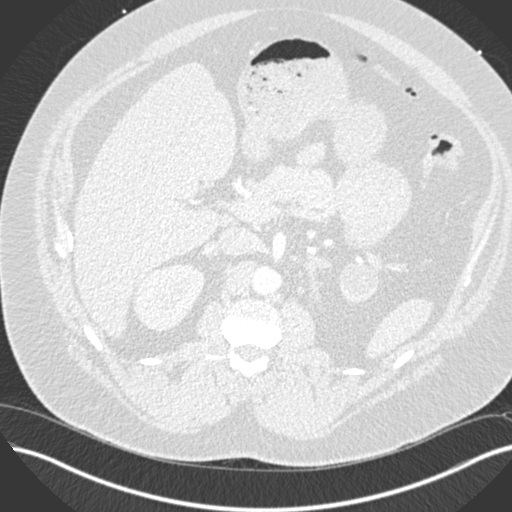
[im 41/309  soft-tissue]
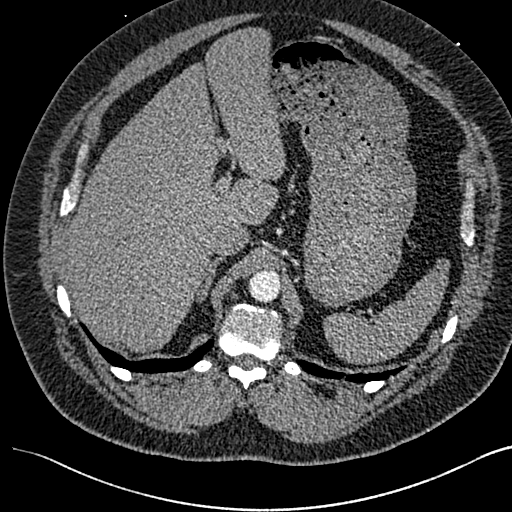
[im 54/309  lung]
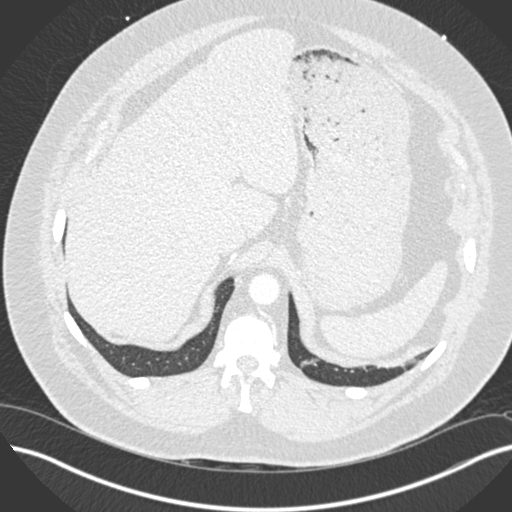
[im 67/309  soft-tissue]
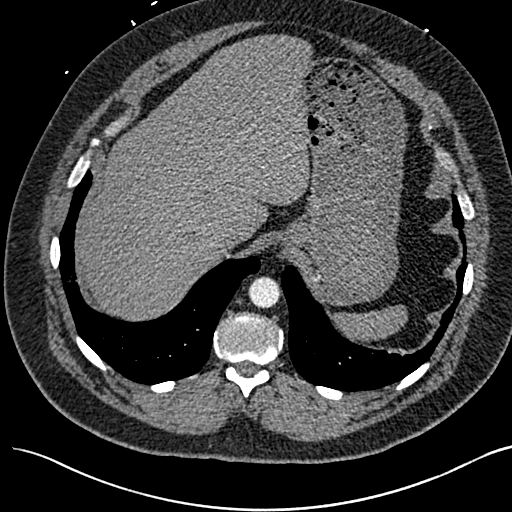
[im 94/309  lung]
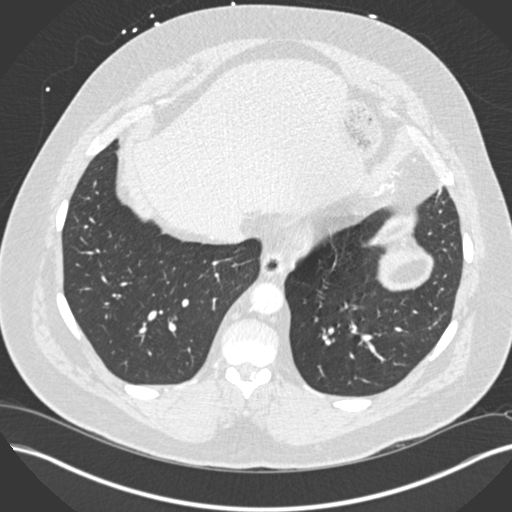
[im 108/309  soft-tissue]
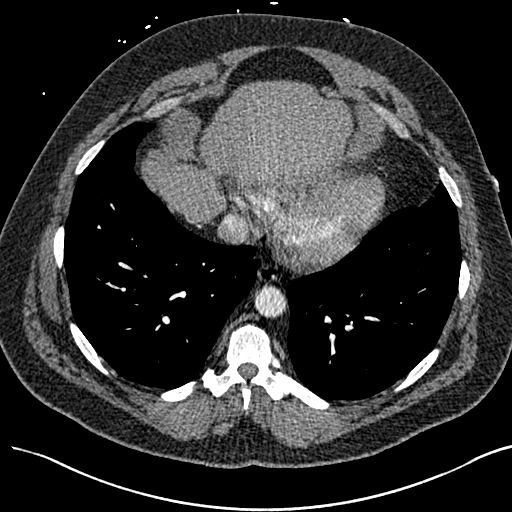
[im 121/309  lung]
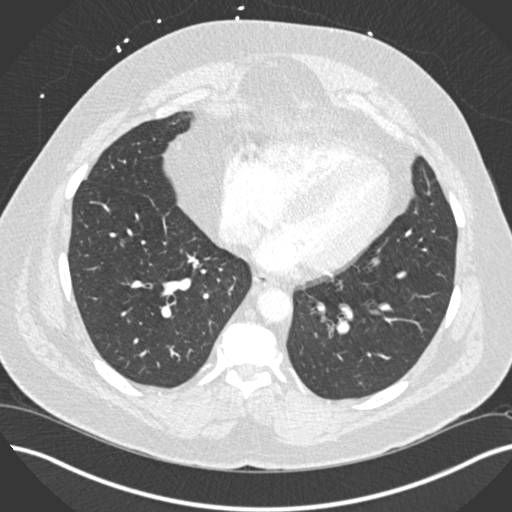
[im 148/309  soft-tissue]
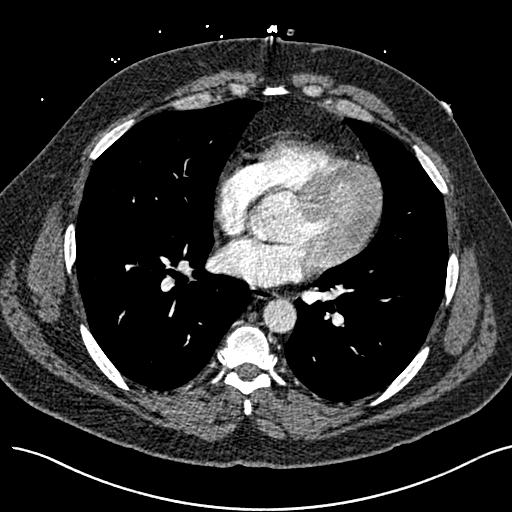
[im 161/309  lung]
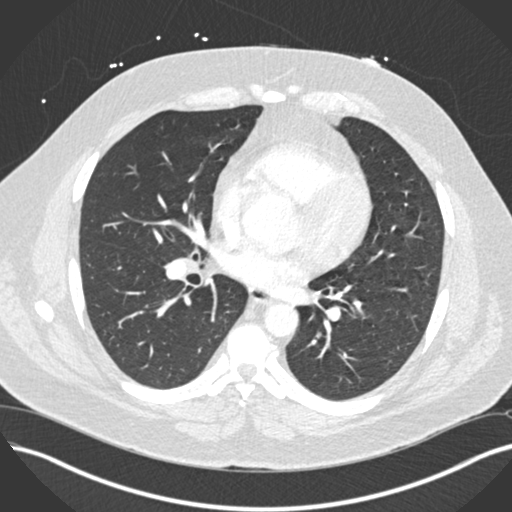
[im 188/309  soft-tissue]
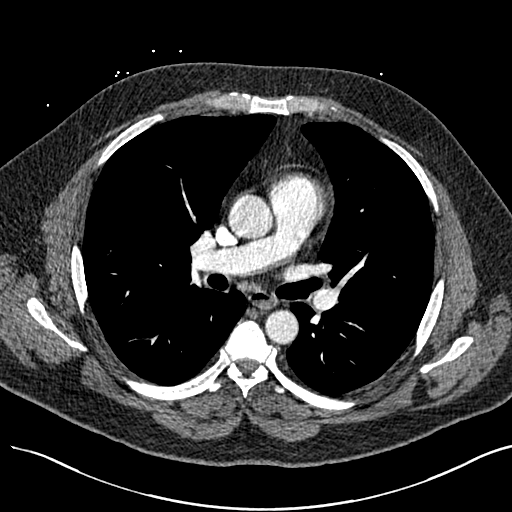
[im 201/309  lung]
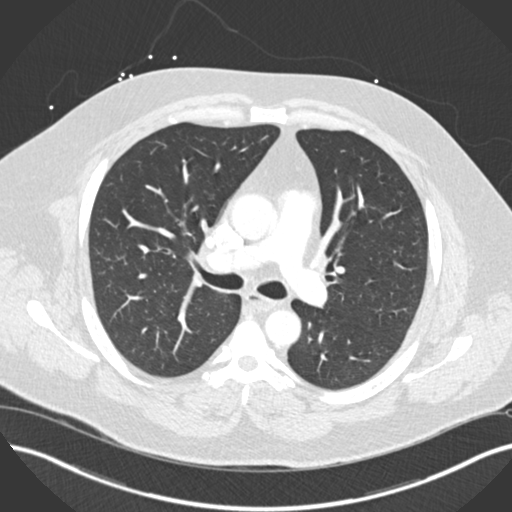
[im 215/309  soft-tissue]
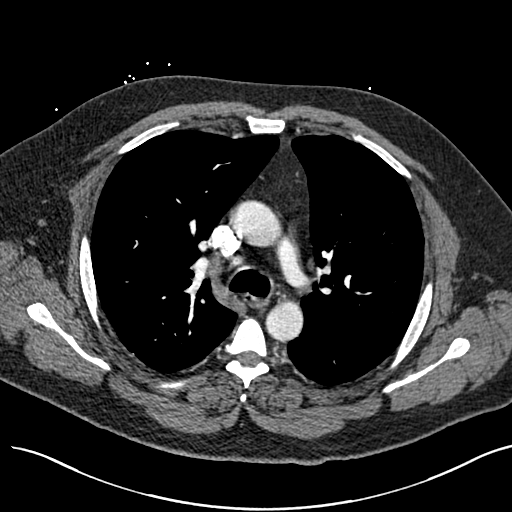
[im 242/309  lung]
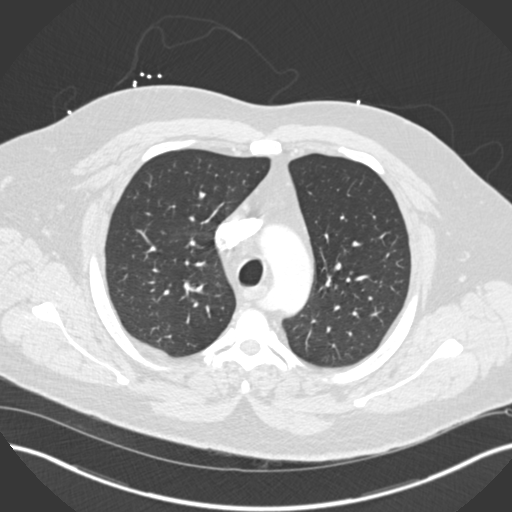
[im 255/309  soft-tissue]
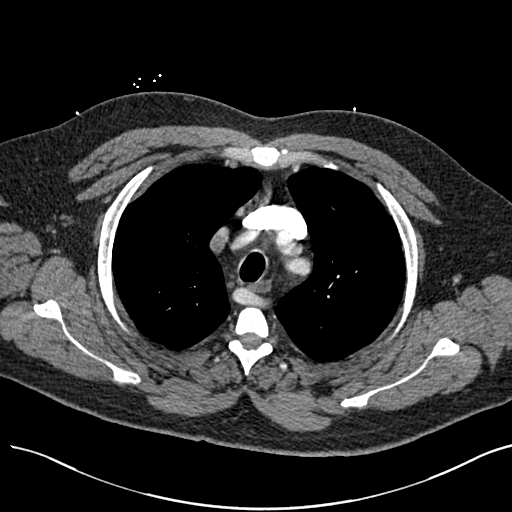
[im 268/309  lung]
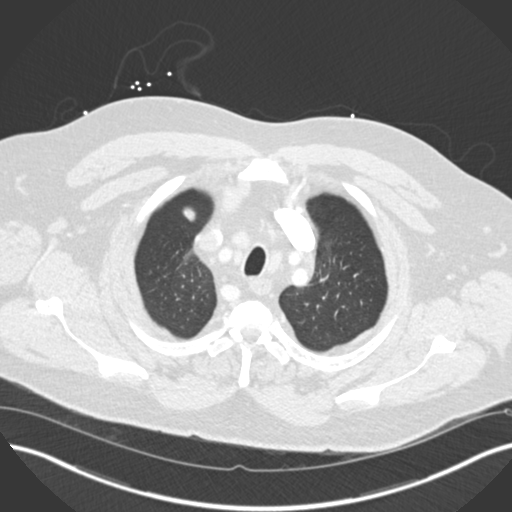
[im 295/309  soft-tissue]
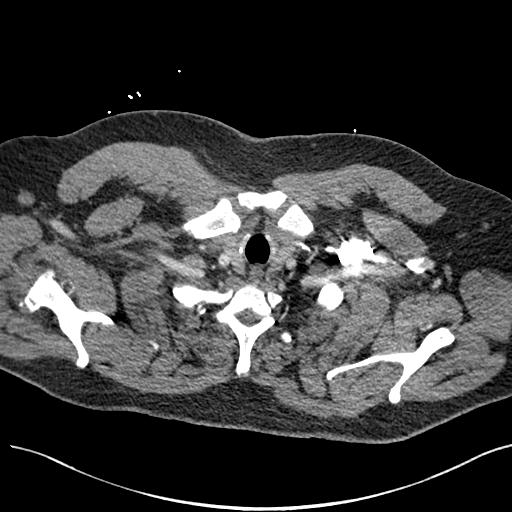

[Series 7: coronal mpr · coronal · 0.63mm/px · 2 of 115 slices shown]
[im 39/115  soft-tissue]
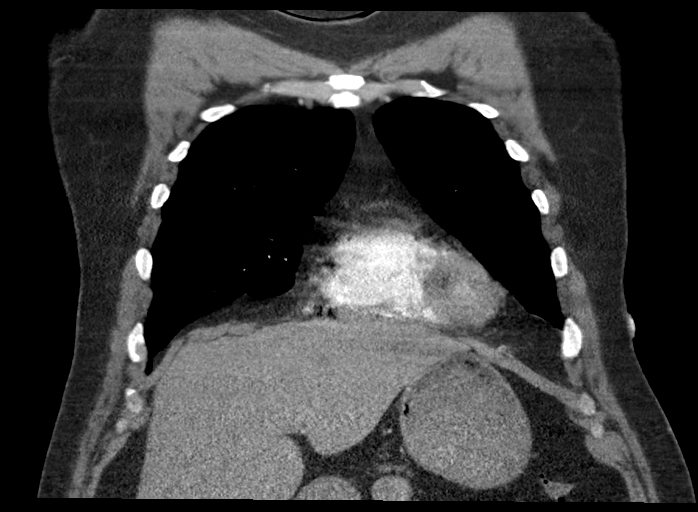
[im 77/115  soft-tissue]
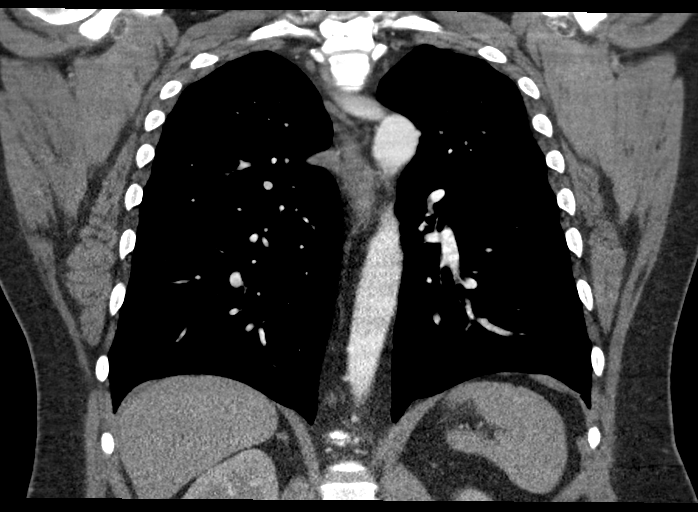

[18 of 46 positions shown; findings below may reference images not displayed]

FINDINGS: Cardiovascular: There is no cardiomegaly or pericardial effusion.
The thoracic aorta is unremarkable. There is a left-sided aortic
arch with aberrant right subclavian artery anatomy. Evaluation of
the pulmonary arteries is limited due suboptimal opacification and
timing of the contrast. No pulmonary artery embolus identified.

Mediastinum/Nodes: There is no hilar or mediastinal adenopathy. The
esophagus is grossly unremarkable. No mediastinal fluid collection.

Lungs/Pleura: The lungs are clear. There is no pleural effusion
pneumothorax. The central airways are patent.

Upper Abdomen: Partially visualized 2.5 cm left renal upper pole
cyst.

Musculoskeletal: No chest wall abnormality. No acute or significant
osseous findings.

Review of the MIP images confirms the above findings.
IMPRESSION: 1. No acute intrathoracic pathology. No CT evidence of pulmonary
embolism.
2. Left-sided aortic arch with aberrant right subclavian artery
anatomy.

## 2023-04-08 ENCOUNTER — Other Ambulatory Visit: Payer: Self-pay

## 2023-04-09 ENCOUNTER — Emergency Department: Payer: MEDICAID

## 2023-04-09 ENCOUNTER — Other Ambulatory Visit: Payer: Self-pay

## 2023-04-09 ENCOUNTER — Inpatient Hospital Stay
Admission: EM | Admit: 2023-04-09 | Discharge: 2023-04-12 | DRG: 189 | Disposition: A | Discharge status: Home / Self Care | Payer: MEDICAID | Attending: Internal Medicine | Admitting: Internal Medicine

## 2023-04-09 DIAGNOSIS — J4541 Moderate persistent asthma with (acute) exacerbation: Secondary | ICD-10-CM

## 2023-04-09 DIAGNOSIS — Z720 Tobacco use: Secondary | ICD-10-CM | POA: Diagnosis present

## 2023-04-09 DIAGNOSIS — Z1152 Encounter for screening for COVID-19: Secondary | ICD-10-CM

## 2023-04-09 DIAGNOSIS — G4733 Obstructive sleep apnea (adult) (pediatric): Secondary | ICD-10-CM | POA: Diagnosis present

## 2023-04-09 DIAGNOSIS — R0789 Other chest pain: Secondary | ICD-10-CM | POA: Diagnosis present

## 2023-04-09 DIAGNOSIS — Z79899 Other long term (current) drug therapy: Secondary | ICD-10-CM

## 2023-04-09 DIAGNOSIS — F17200 Nicotine dependence, unspecified, uncomplicated: Secondary | ICD-10-CM | POA: Diagnosis present

## 2023-04-09 DIAGNOSIS — Z716 Tobacco abuse counseling: Secondary | ICD-10-CM

## 2023-04-09 DIAGNOSIS — F17210 Nicotine dependence, cigarettes, uncomplicated: Secondary | ICD-10-CM | POA: Diagnosis present

## 2023-04-09 DIAGNOSIS — E66813 Obesity, class 3: Secondary | ICD-10-CM | POA: Diagnosis present

## 2023-04-09 DIAGNOSIS — J9601 Acute respiratory failure with hypoxia: Principal | ICD-10-CM | POA: Diagnosis present

## 2023-04-09 DIAGNOSIS — Z6841 Body Mass Index (BMI) 40.0 and over, adult: Secondary | ICD-10-CM

## 2023-04-09 DIAGNOSIS — R7303 Prediabetes: Secondary | ICD-10-CM | POA: Diagnosis present

## 2023-04-09 DIAGNOSIS — R0781 Pleurodynia: Secondary | ICD-10-CM | POA: Diagnosis present

## 2023-04-09 DIAGNOSIS — K219 Gastro-esophageal reflux disease without esophagitis: Secondary | ICD-10-CM | POA: Diagnosis present

## 2023-04-09 DIAGNOSIS — J4551 Severe persistent asthma with (acute) exacerbation: Principal | ICD-10-CM | POA: Diagnosis present

## 2023-04-09 DIAGNOSIS — J383 Other diseases of vocal cords: Secondary | ICD-10-CM | POA: Diagnosis present

## 2023-04-09 DIAGNOSIS — Z8249 Family history of ischemic heart disease and other diseases of the circulatory system: Secondary | ICD-10-CM

## 2023-04-09 DIAGNOSIS — I10 Essential (primary) hypertension: Secondary | ICD-10-CM | POA: Diagnosis present

## 2023-04-09 DIAGNOSIS — D649 Anemia, unspecified: Secondary | ICD-10-CM | POA: Diagnosis present

## 2023-04-09 DIAGNOSIS — R0602 Shortness of breath: Secondary | ICD-10-CM | POA: Diagnosis not present

## 2023-04-09 LAB — BLOOD GAS, VENOUS
Acid-Base Excess: 5.8 mmol/L — ABNORMAL HIGH (ref 0.0–2.0)
Bicarbonate: 29.8 mmol/L — ABNORMAL HIGH (ref 20.0–28.0)
O2 Saturation: 57.4 %
Patient temperature: 37
pCO2, Ven: 40 mmHg — ABNORMAL LOW (ref 44–60)
pH, Ven: 7.48 — ABNORMAL HIGH (ref 7.25–7.43)
pO2, Ven: 32 mmHg (ref 32–45)

## 2023-04-09 LAB — CBC WITH DIFFERENTIAL/PLATELET
Abs Immature Granulocytes: 0.02 10*3/uL (ref 0.00–0.07)
Basophils Absolute: 0 10*3/uL (ref 0.0–0.1)
Basophils Relative: 1 %
Eosinophils Absolute: 0.2 10*3/uL (ref 0.0–0.5)
Eosinophils Relative: 3 %
HCT: 38.9 % — ABNORMAL LOW (ref 39.0–52.0)
Hemoglobin: 12.7 g/dL — ABNORMAL LOW (ref 13.0–17.0)
Immature Granulocytes: 0 %
Lymphocytes Relative: 32 %
Lymphs Abs: 2.3 10*3/uL (ref 0.7–4.0)
MCH: 27.4 pg (ref 26.0–34.0)
MCHC: 32.6 g/dL (ref 30.0–36.0)
MCV: 84 fL (ref 80.0–100.0)
Monocytes Absolute: 0.5 10*3/uL (ref 0.1–1.0)
Monocytes Relative: 7 %
Neutro Abs: 4.2 10*3/uL (ref 1.7–7.7)
Neutrophils Relative %: 57 %
Platelets: 421 10*3/uL — ABNORMAL HIGH (ref 150–400)
RBC: 4.63 MIL/uL (ref 4.22–5.81)
RDW: 14.8 % (ref 11.5–15.5)
WBC: 7.3 10*3/uL (ref 4.0–10.5)
nRBC: 0 % (ref 0.0–0.2)

## 2023-04-09 LAB — BASIC METABOLIC PANEL WITH GFR
Anion gap: 10 (ref 5–15)
BUN: 21 mg/dL — ABNORMAL HIGH (ref 6–20)
CO2: 26 mmol/L (ref 22–32)
Calcium: 8.9 mg/dL (ref 8.9–10.3)
Chloride: 100 mmol/L (ref 98–111)
Creatinine, Ser: 0.77 mg/dL (ref 0.61–1.24)
GFR, Estimated: >60 mL/min (ref >60)
Glucose, Bld: 100 mg/dL — ABNORMAL HIGH (ref 70–99)
Potassium: 3.8 mmol/L (ref 3.5–5.1)
Sodium: 136 mmol/L (ref 135–145)

## 2023-04-09 LAB — TROPONIN I (HIGH SENSITIVITY)
Troponin I (High Sensitivity): 6 ng/L (ref <18)
Troponin I (High Sensitivity): 5 ng/L (ref <18)

## 2023-04-09 LAB — BRAIN NATRIURETIC PEPTIDE: B Natriuretic Peptide: 4.5 pg/mL (ref 0.0–100.0)

## 2023-04-09 LAB — RESP PANEL BY RT-PCR (RSV, FLU A&B, COVID)  RVPGX2
Influenza A by PCR: NEGATIVE
Influenza B by PCR: NEGATIVE
Resp Syncytial Virus by PCR: NEGATIVE
SARS Coronavirus 2 by RT PCR: NEGATIVE

## 2023-04-09 MED ORDER — ACETAMINOPHEN 325 MG PO TABS: 650.0000 mg | ORAL_TABLET | Freq: Four times a day (QID) | ORAL | Status: DC | PRN

## 2023-04-09 MED ORDER — IPRATROPIUM-ALBUTEROL 0.5-2.5 (3) MG/3ML IN SOLN
9.0000 mL | Freq: Once | RESPIRATORY_TRACT | Status: AC
  Administered 2023-04-09: 9 mL via RESPIRATORY_TRACT
  Filled 2023-04-09: qty 9

## 2023-04-09 MED ORDER — METOPROLOL SUCCINATE ER 50 MG PO TB24
50.0000 mg | ORAL_TABLET | Freq: Every day | ORAL | Status: DC
  Administered 2023-04-09 – 2023-04-12 (×4): 50 mg via ORAL
  Filled 2023-04-09 (×4): qty 1

## 2023-04-09 MED ORDER — ALBUTEROL SULFATE (2.5 MG/3ML) 0.083% IN NEBU
2.5000 mg | INHALATION_SOLUTION | RESPIRATORY_TRACT | Status: DC | PRN
  Administered 2023-04-10 – 2023-04-12 (×5): 2.5 mg via RESPIRATORY_TRACT
  Filled 2023-04-09 (×5): qty 3

## 2023-04-09 MED ORDER — IPRATROPIUM-ALBUTEROL 0.5-2.5 (3) MG/3ML IN SOLN
3.0000 mL | RESPIRATORY_TRACT | Status: DC
  Administered 2023-04-09 – 2023-04-11 (×10): 3 mL via RESPIRATORY_TRACT
  Filled 2023-04-09 (×10): qty 3

## 2023-04-09 MED ORDER — DIPHENHYDRAMINE HCL 25 MG PO CAPS
25.0000 mg | ORAL_CAPSULE | Freq: Every day | ORAL | Status: DC
  Administered 2023-04-09 – 2023-04-11 (×3): 25 mg via ORAL
  Filled 2023-04-09 (×3): qty 1

## 2023-04-09 MED ORDER — ALBUTEROL SULFATE (2.5 MG/3ML) 0.083% IN NEBU
10.0000 mg/h | INHALATION_SOLUTION | Freq: Once | RESPIRATORY_TRACT | Status: AC
  Administered 2023-04-09: 10 mg/h via RESPIRATORY_TRACT
  Filled 2023-04-09: qty 12

## 2023-04-09 MED ORDER — MOMETASONE FURO-FORMOTEROL FUM 200-5 MCG/ACT IN AERO
2.0000 | INHALATION_SPRAY | Freq: Two times a day (BID) | RESPIRATORY_TRACT | Status: DC
  Administered 2023-04-09 – 2023-04-12 (×5): 2 via RESPIRATORY_TRACT
  Filled 2023-04-09 (×2): qty 8.8

## 2023-04-09 MED ORDER — FUROSEMIDE 10 MG/ML IJ SOLN
40.0000 mg | Freq: Once | INTRAMUSCULAR | Status: DC
  Filled 2023-04-09 (×2): qty 4

## 2023-04-09 MED ORDER — ALBUTEROL SULFATE (2.5 MG/3ML) 0.083% IN NEBU
2.5000 mg | INHALATION_SOLUTION | RESPIRATORY_TRACT | Status: DC | PRN
  Administered 2023-04-09: 2.5 mg via RESPIRATORY_TRACT
  Filled 2023-04-09: qty 3

## 2023-04-09 MED ORDER — PANTOPRAZOLE SODIUM 40 MG PO TBEC
40.0000 mg | DELAYED_RELEASE_TABLET | Freq: Every day | ORAL | Status: DC
  Administered 2023-04-09 – 2023-04-12 (×4): 40 mg via ORAL
  Filled 2023-04-09 (×4): qty 1

## 2023-04-09 MED ORDER — ENOXAPARIN SODIUM 40 MG/0.4ML IJ SOSY: 40.0000 mg | PREFILLED_SYRINGE | INTRAMUSCULAR | Status: DC

## 2023-04-09 MED ORDER — OXYCODONE HCL 5 MG PO TABS
5.0000 mg | ORAL_TABLET | Freq: Four times a day (QID) | ORAL | Status: DC | PRN
  Administered 2023-04-09 – 2023-04-11 (×4): 5 mg via ORAL
  Filled 2023-04-09 (×4): qty 1

## 2023-04-09 MED ORDER — IPRATROPIUM-ALBUTEROL 0.5-2.5 (3) MG/3ML IN SOLN
3.0000 mL | Freq: Once | RESPIRATORY_TRACT | Status: AC
  Administered 2023-04-09: 3 mL via RESPIRATORY_TRACT
  Filled 2023-04-09: qty 3

## 2023-04-09 MED ORDER — MONTELUKAST SODIUM 10 MG PO TABS
10.0000 mg | ORAL_TABLET | Freq: Every day | ORAL | Status: DC
  Administered 2023-04-09 – 2023-04-12 (×4): 10 mg via ORAL
  Filled 2023-04-09 (×4): qty 1

## 2023-04-09 MED ORDER — DM-GUAIFENESIN ER 30-600 MG PO TB12: 1.0000 | ORAL_TABLET | Freq: Two times a day (BID) | ORAL | Status: DC | PRN

## 2023-04-09 MED ORDER — METHYLPREDNISOLONE SODIUM SUCC 125 MG IJ SOLR
80.0000 mg | Freq: Every day | INTRAMUSCULAR | Status: DC
  Administered 2023-04-10 – 2023-04-12 (×3): 80 mg via INTRAVENOUS
  Filled 2023-04-09 (×3): qty 2

## 2023-04-09 MED ORDER — MAGNESIUM SULFATE 2 GM/50ML IV SOLN
2.0000 g | Freq: Once | INTRAVENOUS | Status: AC
  Administered 2023-04-09: 2 g via INTRAVENOUS
  Filled 2023-04-09: qty 50

## 2023-04-09 MED ORDER — ENOXAPARIN SODIUM 80 MG/0.8ML IJ SOSY
0.5000 mg/kg | PREFILLED_SYRINGE | INTRAMUSCULAR | Status: DC
  Administered 2023-04-09 – 2023-04-11 (×3): 77.5 mg via SUBCUTANEOUS
  Filled 2023-04-09: qty 0.8
  Filled 2023-04-09 (×2): qty 0.78
  Filled 2023-04-09: qty 0.8

## 2023-04-09 MED ORDER — HYDRALAZINE HCL 20 MG/ML IJ SOLN: 5.0000 mg | INTRAMUSCULAR | Status: DC | PRN

## 2023-04-09 MED ORDER — LORATADINE 10 MG PO TABS
10.0000 mg | ORAL_TABLET | Freq: Every day | ORAL | Status: DC
  Administered 2023-04-09 – 2023-04-12 (×4): 10 mg via ORAL
  Filled 2023-04-09 (×4): qty 1

## 2023-04-09 MED ORDER — NICOTINE 21 MG/24HR TD PT24
21.0000 mg | MEDICATED_PATCH | Freq: Every day | TRANSDERMAL | Status: DC
  Administered 2023-04-09 – 2023-04-12 (×4): 21 mg via TRANSDERMAL
  Filled 2023-04-09 (×4): qty 1

## 2023-04-09 MED ORDER — ONDANSETRON HCL 4 MG/2ML IJ SOLN: 4.0000 mg | Freq: Three times a day (TID) | INTRAMUSCULAR | Status: DC | PRN

## 2023-04-09 MED ORDER — AMLODIPINE BESYLATE 5 MG PO TABS
5.0000 mg | ORAL_TABLET | Freq: Every day | ORAL | Status: DC
  Administered 2023-04-09 – 2023-04-12 (×4): 5 mg via ORAL
  Filled 2023-04-09 (×4): qty 1

## 2023-04-09 MED ORDER — METHYLPREDNISOLONE SODIUM SUCC 125 MG IJ SOLR
125.0000 mg | Freq: Once | INTRAMUSCULAR | Status: AC
  Administered 2023-04-09: 125 mg via INTRAVENOUS
  Filled 2023-04-09: qty 2

## 2023-04-09 NOTE — Progress Notes (Signed)
 PHARMACIST - PHYSICIAN COMMUNICATION  CONCERNING:  Enoxaparin (Lovenox) for DVT Prophylaxis    RECOMMENDATION: Patient was prescribed enoxaprin 40mg  q24 hours for VTE prophylaxis.   Filed Weights   04/09/23 1547  Weight: (!) 155.6 kg (343 lb)    Body mass index is 49.22 kg/m.  Estimated Creatinine Clearance: 182.2 mL/min (by C-G formula based on SCr of 0.77 mg/dL).   Based on [facility] policy patient is candidate for enoxaparin 0.5mg /kg TBW SQ every 24 hours based on BMI being >30.  DESCRIPTION: Pharmacy has adjusted enoxaparin dose per [facility] policy.  Patient is now receiving enoxaparin 77.5 mg every 24 hours    Foye Deer, PharmD Clinical Pharmacist  04/09/2023 6:33 PM

## 2023-04-09 NOTE — H&P (Addendum)
 History and Physical    Justin Reid:[id number] DOB: 04-13-1980 DOA: 04/09/2023  Referring MD/NP/PA:   PCP: Pcp, No   Patient coming from:  The patient is coming from home.     Chief Complaint: SOB  HPI: Justin Reid is a 43 y.o. male with medical history significant of  tobacco abuse, asthma, HTN, vocal cord dysfunction, OSA on CPAP, morbid obesity with BMI 49.22, who presents with shortness of breath.  Pt was recently hospitalized from 3/28 - 4/2 due to asthma exacerbation.  Patient had CTA on 04/02/2023 which was negative for PE.  Patient was discharged at the improved condition.  He states in the past 2 days, his shortness of breath has been progressively worsening.  Patient has dry cough, no fever or chills.  He also reports chest pain, which is located in front chest, intermittent, initially severe, currently mild, pleuritic, aggravated by deep breath.  No tenderness in the calf areas.  Patient does not have nausea, vomiting, diarrhea or abdominal pain.  No symptoms of UTI.  Patient was found to have severe respiratory distress, using accessory muscle for breathing, could not speak in full sentence, BiPAP is started in ED.   Data reviewed independently and ED Course: pt was found to have WBC 7.3, BNP 4.5, negative PCR for COVID, flu and RSV, troponin 6, GFR> 60, temperature normal, blood pressure 148/112, heart rate 92, RR 22.  VBG with pH 7.48, CO2 40 and O2 32.  Patient is admitted to PCU as inpatient.  EKG: I have personally reviewed.  Sinus rhythm, QTc 447, borderline LAD   Review of Systems:   General: no fevers, chills, no body weight gain, has fatigue HEENT: no blurry vision, hearing changes or sore throat Respiratory: has dyspnea, coughing, wheezing CV: has chest pain, no palpitations GI: no nausea, vomiting, abdominal pain, diarrhea, constipation GU: no dysuria, burning on urination, increased urinary frequency, hematuria  Ext: no leg edema Neuro: no  unilateral weakness, numbness, or tingling, no vision change or hearing loss Skin: no rash, no skin tear. MSK: No muscle spasm, no deformity, no limitation of range of movement in spin Heme: No easy bruising.  Travel history: No recent long distant travel.   Allergy: No Known Allergies  Past Medical History:  Diagnosis Date   2019-nCoV vaccination declined 11/10/2020   Arthritis    Asthma    Hypertension    Morbid obesity (HCC)    Shingles    Tobacco use     History reviewed. No pertinent surgical history.  Social History:  reports that he has been smoking cigarettes. He has a 2.5 pack-year smoking history. He has never used smokeless tobacco. He reports current alcohol use. He reports that he does not use drugs.  Family History:  Family History  Problem Relation Age of Onset   Hypertension Father      Prior to Admission medications   Medication Sig Start Date End Date Taking? Authorizing Provider  acetaminophen (TYLENOL) 325 MG tablet Take 2 tablets (650 mg total) by mouth every 6 (six) hours as needed for mild pain (pain score 1-3) or fever. 04/06/23   Sunnie Nielsen, DO  albuterol (VENTOLIN HFA) 108 (90 Base) MCG/ACT inhaler Inhale 2 puffs into the lungs every 4 (four) hours as needed for wheezing or shortness of breath. 04/06/23   Sunnie Nielsen, DO  amLODipine (NORVASC) 5 MG tablet Take 1 tablet (5 mg total) by mouth daily. 04/06/23 07/05/23  Sunnie Nielsen, DO  Dextromethorphan-guaiFENesin 20-200 MG/20ML  LIQD Take 10 mLs by mouth every 6 (six) hours as needed. 04/06/23   Sunnie Nielsen, DO  fluticasone-salmeterol [facility] INHUB) 250-50 MCG/ACT AEPB Inhale 1 puff into the lungs in the morning and at bedtime. 04/06/23   Sunnie Nielsen, DO  loratadine (CLARITIN) 10 MG tablet Take 1 tablet (10 mg total) by mouth daily. 04/06/23   Sunnie Nielsen, DO  metoprolol succinate (TOPROL-XL) 50 MG 24 hr tablet Take 1 tablet (50 mg total) by mouth daily. Take with or immediately  following a meal. 04/06/23 04/05/24  Sunnie Nielsen, DO  montelukast (SINGULAIR) 10 MG tablet Take 1 tablet (10 mg total) by mouth daily. 04/06/23   Sunnie Nielsen, DO  nicotine (NICODERM CQ - DOSED IN MG/24 HOURS) 14 mg/24hr patch Place 1 patch (14 mg total) onto the skin daily as needed (nicotine craving). 04/06/23   Sunnie Nielsen, DO  oxyCODONE (OXY IR/ROXICODONE) 5 MG immediate release tablet Take 1 tablet (5 mg total) by mouth every 6 (six) hours as needed for moderate pain (pain score 4-6) or severe pain (pain score 7-10). 04/06/23   Sunnie Nielsen, DO  pantoprazole (PROTONIX) 40 MG tablet Take 1 tablet (40 mg total) by mouth daily. 04/06/23   Sunnie Nielsen, DO  predniSONE (DELTASONE) 20 MG tablet Take 2 tablets (40 mg total) by mouth daily with breakfast. 04/07/23   Sunnie Nielsen, DO    Physical Exam: Vitals:   04/09/23 1551 04/09/23 1730 04/09/23 1806 04/09/23 1808  BP: (!) 148/112 (!) 158/71    Pulse: 92 83 86   Resp: (!) 22 (!) 21 (!) 34   Temp: 98.6 F (37 C)     TempSrc: Oral     SpO2: 100% 94% 99% 99%  Weight:      Height:       General: Has acute respiratory distress HEENT:       Eyes: PERRL, EOMI, no jaundice       ENT: No discharge from the ears and nose, no pharynx injection, no tonsillar enlargement.        Neck: Difficult to assess JVD due to morbid obesity.  No bruit, no mass felt. Heme: No neck lymph node enlargement. Cardiac: S1/S2, RRR, No murmurs, No gallops or rubs. Respiratory: Has severely decreased air movement on left side, has wheezing on the right side GI: Soft, nondistended, nontender, no rebound pain, no organomegaly, BS present. GU: No hematuria Ext: No pitting leg edema bilaterally. 1+DP/PT pulse bilaterally. Musculoskeletal: No joint deformities, No joint redness or warmth, no limitation of ROM in spin. Skin: No rashes.  Neuro: Alert, oriented X3, cranial nerves II-XII grossly intact, moves all extremities normally.   Psych: Patient  is not psychotic, no suicidal or hemocidal ideation.  Labs on Admission: I have personally reviewed following labs and imaging studies  CBC: Recent Labs  Lab 04/09/23 1552  WBC 7.3  NEUTROABS 4.2  HGB 12.7*  HCT 38.9*  MCV 84.0  PLT 421*   Basic Metabolic Panel: Recent Labs  Lab 04/09/23 1552  NA 136  K 3.8  CL 100  CO2 26  GLUCOSE 100*  BUN 21*  CREATININE 0.77  CALCIUM 8.9   GFR: Estimated Creatinine Clearance: 182.2 mL/min (by C-G formula based on SCr of 0.77 mg/dL). Liver Function Tests: No results for input(s): "AST", "ALT", "ALKPHOS", "BILITOT", "PROT", "ALBUMIN" in the last 168 hours. No results for input(s): "LIPASE", "AMYLASE" in the last 168 hours. No results for input(s): "AMMONIA" in the last 168 hours. Coagulation Profile: No results for  input(s): "INR", "PROTIME" in the last 168 hours. Cardiac Enzymes: No results for input(s): "CKTOTAL", "CKMB", "CKMBINDEX", "TROPONINI" in the last 168 hours. BNP (last 3 results) No results for input(s): "PROBNP" in the last 8760 hours. HbA1C: No results for input(s): "HGBA1C" in the last 72 hours. CBG: Recent Labs  Lab 04/05/23 1201 04/05/23 1651 04/05/23 2101 04/06/23 0801 04/06/23 1127  GLUCAP 100* 114* 97 85 123*   Lipid Profile: No results for input(s): "CHOL", "HDL", "LDLCALC", "TRIG", "CHOLHDL", "LDLDIRECT" in the last 72 hours. Thyroid Function Tests: No results for input(s): "TSH", "T4TOTAL", "FREET4", "T3FREE", "THYROIDAB" in the last 72 hours. Anemia Panel: No results for input(s): "VITAMINB12", "FOLATE", "FERRITIN", "TIBC", "IRON", "RETICCTPCT" in the last 72 hours. Urine analysis: No results found for: "COLORURINE", "APPEARANCEUR", "LABSPEC", "PHURINE", "GLUCOSEU", "HGBUR", "BILIRUBINUR", "KETONESUR", "PROTEINUR", "UROBILINOGEN", "NITRITE", "LEUKOCYTESUR" Sepsis Labs: @LABRCNTIP (procalcitonin:4,lacticidven:4) ) Recent Results (from the past 240 hours)  Resp panel by RT-PCR (RSV, Flu A&B,  Covid) Anterior Nasal Swab     Status: None   Collection Time: 04/01/23  4:32 PM   Specimen: Anterior Nasal Swab  Result Value Ref Range Status   SARS Coronavirus 2 by RT PCR NEGATIVE NEGATIVE Final    Comment: (NOTE) SARS-CoV-2 target nucleic acids are NOT DETECTED.  The SARS-CoV-2 RNA is generally detectable in upper respiratory specimens during the acute phase of infection. The lowest concentration of SARS-CoV-2 viral copies this assay can detect is 138 copies/mL. A negative result does not preclude SARS-Cov-2 infection and should not be used as the sole basis for treatment or other patient management decisions. A negative result may occur with  improper specimen collection/handling, submission of specimen other than nasopharyngeal swab, presence of viral mutation(s) within the areas targeted by this assay, and inadequate number of viral copies(<138 copies/mL). A negative result must be combined with clinical observations, patient history, and epidemiological information. The expected result is Negative.  Fact Sheet for Patients:  BloggerCourse.com  Fact Sheet for Healthcare Providers:  SeriousBroker.it  This test is no t yet approved or cleared by the [place] FDA and  has been authorized for detection and/or diagnosis of SARS-CoV-2 by FDA under an Emergency Use Authorization (EUA). This EUA will remain  in effect (meaning this test can be used) for the duration of the COVID-19 declaration under Section 564(b)(1) of the Act, 21 U.S.C.section 360bbb-3(b)(1), unless the authorization is terminated  or revoked sooner.       Influenza A by PCR NEGATIVE NEGATIVE Final   Influenza B by PCR NEGATIVE NEGATIVE Final    Comment: (NOTE) The Xpert Xpress SARS-CoV-2/FLU/RSV plus assay is intended as an aid in the diagnosis of influenza from Nasopharyngeal swab specimens and should not be used as a sole basis for treatment. Nasal  washings and aspirates are unacceptable for Xpert Xpress SARS-CoV-2/FLU/RSV testing.  Fact Sheet for Patients: BloggerCourse.com  Fact Sheet for Healthcare Providers: SeriousBroker.it  This test is not yet approved or cleared by the [place] FDA and has been authorized for detection and/or diagnosis of SARS-CoV-2 by FDA under an Emergency Use Authorization (EUA). This EUA will remain in effect (meaning this test can be used) for the duration of the COVID-19 declaration under Section 564(b)(1) of the Act, 21 U.S.C. section 360bbb-3(b)(1), unless the authorization is terminated or revoked.     Resp Syncytial Virus by PCR NEGATIVE NEGATIVE Final    Comment: (NOTE) Fact Sheet for Patients: BloggerCourse.com  Fact Sheet for Healthcare Providers: SeriousBroker.it  This test is not yet approved or cleared by  the [employer] and has been authorized for detection and/or diagnosis of SARS-CoV-2 by FDA under an Emergency Use Authorization (EUA). This EUA will remain in effect (meaning this test can be used) for the duration of the COVID-19 declaration under Section 564(b)(1) of the Act, 21 U.S.C. section 360bbb-3(b)(1), unless the authorization is terminated or revoked.  Performed at [facility], [address] [place], [place] [address]   Respiratory (~20 pathogens) panel by PCR     Status: None   Collection Time: 04/01/23  7:31 PM   Specimen: Nasopharyngeal Swab; Respiratory  Result Value Ref Range Status   Adenovirus NOT DETECTED NOT DETECTED Final   Coronavirus 229E NOT DETECTED NOT DETECTED Final    Comment: (NOTE) The Coronavirus on the Respiratory Panel, DOES NOT test for the novel  Coronavirus (2019 nCoV)    Coronavirus HKU1 NOT DETECTED NOT DETECTED Final   Coronavirus NL63 NOT DETECTED NOT DETECTED Final   Coronavirus OC43 NOT DETECTED NOT DETECTED  Final   Metapneumovirus NOT DETECTED NOT DETECTED Final   Rhinovirus / Enterovirus NOT DETECTED NOT DETECTED Final   Influenza A NOT DETECTED NOT DETECTED Final   Influenza B NOT DETECTED NOT DETECTED Final   Parainfluenza Virus 1 NOT DETECTED NOT DETECTED Final   Parainfluenza Virus 2 NOT DETECTED NOT DETECTED Final   Parainfluenza Virus 3 NOT DETECTED NOT DETECTED Final   Parainfluenza Virus 4 NOT DETECTED NOT DETECTED Final   Respiratory Syncytial Virus NOT DETECTED NOT DETECTED Final   Bordetella pertussis NOT DETECTED NOT DETECTED Final   Bordetella Parapertussis NOT DETECTED NOT DETECTED Final   Chlamydophila pneumoniae NOT DETECTED NOT DETECTED Final   Mycoplasma pneumoniae NOT DETECTED NOT DETECTED Final    Comment: Performed at [facility], [address] [place], [place] [address]  Resp panel by RT-PCR (RSV, Flu A&B, Covid) Anterior Nasal Swab     Status: None   Collection Time: 04/09/23  4:51 PM   Specimen: Anterior Nasal Swab  Result Value Ref Range Status   SARS Coronavirus 2 by RT PCR NEGATIVE NEGATIVE Final    Comment: (NOTE) SARS-CoV-2 target nucleic acids are NOT DETECTED.  The SARS-CoV-2 RNA is generally detectable in upper respiratory specimens during the acute phase of infection. The lowest concentration of SARS-CoV-2 viral copies this assay can detect is 138 copies/mL. A negative result does not preclude SARS-Cov-2 infection and should not be used as the sole basis for treatment or other patient management decisions. A negative result may occur with  improper specimen collection/handling, submission of specimen other than nasopharyngeal swab, presence of viral mutation(s) within the areas targeted by this assay, and inadequate number of viral copies(<138 copies/mL). A negative result must be combined with clinical observations, patient history, and epidemiological information. The expected result is Negative.  Fact Sheet for Patients:   BloggerCourse.com  Fact Sheet for Healthcare Providers:  SeriousBroker.it  This test is no t yet approved or cleared by the [place] FDA and  has been authorized for detection and/or diagnosis of SARS-CoV-2 by FDA under an Emergency Use Authorization (EUA). This EUA will remain  in effect (meaning this test can be used) for the duration of the COVID-19 declaration under Section 564(b)(1) of the Act, 21 U.S.C.section 360bbb-3(b)(1), unless the authorization is terminated  or revoked sooner.       Influenza A by PCR NEGATIVE NEGATIVE Final   Influenza B by PCR NEGATIVE NEGATIVE Final    Comment: (NOTE) The Xpert Xpress SARS-CoV-2/FLU/RSV  plus assay is intended as an aid in the diagnosis of influenza from Nasopharyngeal swab specimens and should not be used as a sole basis for treatment. Nasal washings and aspirates are unacceptable for Xpert Xpress SARS-CoV-2/FLU/RSV testing.  Fact Sheet for Patients: BloggerCourse.com  Fact Sheet for Healthcare Providers: SeriousBroker.it  This test is not yet approved or cleared by the [place] FDA and has been authorized for detection and/or diagnosis of SARS-CoV-2 by FDA under an Emergency Use Authorization (EUA). This EUA will remain in effect (meaning this test can be used) for the duration of the COVID-19 declaration under Section 564(b)(1) of the Act, 21 U.S.C. section 360bbb-3(b)(1), unless the authorization is terminated or revoked.     Resp Syncytial Virus by PCR NEGATIVE NEGATIVE Final    Comment: (NOTE) Fact Sheet for Patients: BloggerCourse.com  Fact Sheet for Healthcare Providers: SeriousBroker.it  This test is not yet approved or cleared by the [place] FDA and has been authorized for detection and/or diagnosis of SARS-CoV-2 by FDA under an Emergency Use  Authorization (EUA). This EUA will remain in effect (meaning this test can be used) for the duration of the COVID-19 declaration under Section 564(b)(1) of the Act, 21 U.S.C. section 360bbb-3(b)(1), unless the authorization is terminated or revoked.  Performed at [facility], [address], [place], [place] [address]      Radiological Exams on Admission:   Assessment/Plan Principal Problem:   Acute severe exacerbation of severe persistent asthma Active Problems:   Acute respiratory failure with hypoxia (HCC)   Pleuritic chest pain   OSA (obstructive sleep apnea)   Tobacco abuse   HTN (hypertension)   GERD (gastroesophageal reflux disease)   Obesity, Class III, BMI 40-49.9 (morbid obesity) (HCC)   Assessment and Plan:  Acute respiratory failure with hypoxia due to acute severe exacerbation of severe persistent asthma: Patient has severely decreased air movement on the left side, and wheezing on the right side.  Currently requiring BiPAP.  Patient does not have history of CHF.  2D echo on 03/09/2023 showed EF of 60 to 65%. His BNP is normal at 4.5, but his body weight has increased from 136 kg on 3/28 to 155.6 kg today.  It is very difficult to assess volume status due to morbid obesity.  Will give 40 mg of lasix empirically.  - Admit to PCU as inpt - continue BiPAP now --> try to wean off BiPAP -Bronchodilators and prn Mucinex -2 g magnesium sulfate was given -Solu-Medrol 80 mg IV daily after given 125 mg in ED -Incentive spirometry -check RVP - 1 dose Lasix 40 mg IV  Pleuritic chest pain: Patient reports intermittent pleuritic chest pain, PE is potential differential diagnosis.  Clinically patient has asthma exacerbation due to decreased air movement on the left side and wheezing on the right side.  Due to pleuritic chest pain in previous admission, patient had CTA which was negative for PE on 04/02/2023, will not repeat CTA today.  Will treat patient as asthma now  and observe closely.  If patient does not improve, may consider to repeat CTA again.  Troponin is 6. - Get another troponin - As needed oxycodone and Tylenol for pain   OSA (obstructive sleep apnea) -currently on BiPAP   Tobacco abuse -Due to counseling about importance of quitting smoking -Nicotine patch   HTN (hypertension): Blood pressure 144/97.  Patient is not taking amlodipine and metoprolol currently. -Amlodipine 5 mg daily, metoprolol 50 mg twice daily -IV hydralazine as needed  GERD -Protonix   Obesity, Class III, BMI 40-49.9 (morbid obesity) (HCC): Body weight 155 kg, BMI 49.22 -Encourage losing weight -Exercise and healthy diet     DVT ppx: SQ Lovenox  Code Status: Full code     Family Communication: Yes, patient's wife   at bed side.     Disposition Plan:  Anticipate discharge back to previous environment  Consults called:  none  Admission status and Level of care: Progressive:  as inpt        Dispo: The patient is from: Home              Anticipated d/c is to: Home              Anticipated d/c date is: 2 days              Patient currently is not medically stable to d/c.    Severity of Illness:  The appropriate patient status for this patient is INPATIENT. Inpatient status is judged to be reasonable and necessary in order to provide the required intensity of service to ensure the patient's safety. The patient's presenting symptoms, physical exam findings, and initial radiographic and laboratory data in the context of their chronic comorbidities is felt to place them at high risk for further clinical deterioration. Furthermore, it is not anticipated that the patient will be medically stable for discharge from the hospital within 2 midnights of admission.   * I certify that at the point of admission it is my clinical judgment that the patient will require inpatient hospital care spanning beyond 2 midnights from the point of admission due to high intensity  of service, high risk for further deterioration and high frequency of surveillance required.*       Date of Service 04/09/2023    Lorretta Harp [facility]   If 7PM-7AM, please contact night-coverage [email or web address] 04/09/2023, 7:01 PM

## 2023-04-09 NOTE — ED Notes (Signed)
 CCMD called, pt placed on tele monitor

## 2023-04-09 NOTE — ED Notes (Signed)
 Pt asking to order dinner for later. Given menu and phone

## 2023-04-09 NOTE — ED Notes (Signed)
 Responded to call light.  Patient c/o needing oxygen that sats were dropping.  Oxygen saturation 96-98%, patient and family educated that number they were referencing was respiratory rate, not saturation.  Reassurance given with minimal effect. Patient repositioned in bed.  Expiratory wheezing audibly heard, Dr. Arnoldo Morale alerted.

## 2023-04-09 NOTE — Group Note (Deleted)
 Date:  04/09/2023 Time:  9:53 PM  Group Topic/Focus:  Wrap-Up Group:   The focus of this group is to help patients review their daily goal of treatment and discuss progress on daily workbooks.     Participation Level:  {BHH PARTICIPATION WUJWJ:19147}  Participation Quality:  {BHH PARTICIPATION QUALITY:22265}  Affect:  {BHH AFFECT:22266}  Cognitive:  {BHH COGNITIVE:22267}  Insight: {BHH Insight2:20797}  Engagement in Group:  {BHH ENGAGEMENT IN WGNFA:21308}  Modes of Intervention:  {BHH MODES OF INTERVENTION:22269}  Additional Comments:  ***  Maglione,Angell Honse E 04/09/2023, 9:53 PM

## 2023-04-09 NOTE — ED Provider Notes (Signed)
 [facility] Provider Note    Event Date/Time   First MD Initiated Contact with Patient 04/09/23 1631     (approximate)   History   Asthma   HPI  Justin Reid is a 43 y.o. male past medical history significant for asthma, obesity, tobacco use, hypertension, who presents to the emergency department shortness of breath.  Patient was recently discharged from the hospital following an hospitalization for asthma exacerbation.  States that over the past couple of days he has had worsening shortness of breath.  This morning was feeling worse with shortness of breath, cough and feeling very jittery.  Denies any chest pain at this time.  States that it feel similar to prior episodes of asthma.  Does endorse ongoing tobacco use.  Denies any falls or trauma.  Denies any history of DVT or PE.  Prior intubation and BiPAP need for asthma.     Physical Exam   Triage Vital Signs: ED Triage Vitals  Encounter Vitals Group     BP 04/09/23 1551 (!) 148/112     Systolic BP Percentile --      Diastolic BP Percentile --      Pulse Rate 04/09/23 1551 92     Resp 04/09/23 1551 (!) 22     Temp 04/09/23 1551 98.6 F (37 C)     Temp Source 04/09/23 1551 Oral     SpO2 04/09/23 1551 100 %     Weight 04/09/23 1547 (!) 343 lb (155.6 kg)     Height 04/09/23 1547 5\' 10"  (1.778 m)     Head Circumference --      Peak Flow --      Pain Score 04/09/23 1546 0     Pain Loc --      Pain Education --      Exclude from Growth Chart --     Most recent vital signs: Vitals:   04/09/23 1551  BP: (!) 148/112  Pulse: 92  Resp: (!) 22  Temp: 98.6 F (37 C)  SpO2: 100%    Physical Exam Constitutional:      Appearance: He is well-developed.  HENT:     Head: Atraumatic.     Mouth/Throat:     Mouth: Mucous membranes are dry.  Eyes:     Conjunctiva/sclera: Conjunctivae normal.     Pupils: Pupils are equal, round, and reactive to light.  Cardiovascular:     Rate and Rhythm:  Regular rhythm.  Pulmonary:     Effort: Respiratory distress present.     Breath sounds: Wheezing present.     Comments: Tachypneic, speaking in one-word sentences.  Retractions.  Poor air movement with inspiratory and expiratory wheeze Musculoskeletal:     Cervical back: Normal range of motion.     Right lower leg: No edema.     Left lower leg: No edema.  Skin:    General: Skin is warm.     Capillary Refill: Capillary refill takes less than 2 seconds.  Neurological:     Mental Status: He is alert. Mental status is at baseline.     IMPRESSION / MDM / ASSESSMENT AND PLAN / ED COURSE  I reviewed the triage vital signs and the nursing notes.  On chart review patient was recently admitted and had a prolonged hospitalization following asthma exacerbation.  Patient was also admitted twice in March.  Differential diagnosis including asthma, COPD, CHF, ACS, anemia, pneumonia, viral illness including COVID/influenza, obesity hypoventilation syndrome   EKG  Pietro Cassis, the attending physician, personally viewed and interpreted this ECG.  EKG showed normal sinus rhythm.  Nonspecific ST changes.  Signs of atrial enlargement.  No significant change when compared to prior EKG.  No findings of acute ischemia or dysrhythmia.  No tachycardic or bradycardic dysrhythmias while on cardiac telemetry.  RADIOLOGY I independently reviewed imaging, my interpretation of imaging: Chest x-ray -no obvious pulmonary edema or pneumonia.  LABS (all labs ordered are listed, but only abnormal results are displayed) Labs interpreted as -    Labs Reviewed  CBC WITH DIFFERENTIAL/PLATELET - Abnormal; Notable for the following components:      Result Value   Hemoglobin 12.7 (*)    HCT 38.9 (*)    Platelets 421 (*)    All other components within normal limits  BASIC METABOLIC PANEL WITH GFR - Abnormal; Notable for the following components:   Glucose, Bld 100 (*)    BUN 21 (*)    All other components  within normal limits  BLOOD GAS, VENOUS - Abnormal; Notable for the following components:   pH, Ven 7.48 (*)    pCO2, Ven 40 (*)    Bicarbonate 29.8 (*)    Acid-Base Excess 5.8 (*)    All other components within normal limits  RESP PANEL BY RT-PCR (RSV, FLU A&B, COVID)  [id number]  BRAIN NATRIURETIC PEPTIDE  TROPONIN I (HIGH SENSITIVITY)     MDM  No leukocytosis.  Anemia but hemoglobin is stable at 12.7.  Creatinine appears to be at baseline.  Glucose 100.  No significant electrolyte abnormality.  Patient given continuous DuoNeb treatment, IV Solu-Medrol and IV magnesium given severe asthma exacerbation.  Clinical Course as of 04/09/23 1804  Sat Apr 09, 2023  1804 On reevaluation patient continues to be in respiratory distress.  Patient was placed on BiPAP.  Given a continuous albuterol.  Consulted hospitalist for admission for acute on chronic respiratory distress from asthma exacerbation. [SM]    Clinical Course User Index [SM] Corena Herter, MD     PROCEDURES:  Critical Care performed: yes  .Critical Care  Performed by: Corena Herter, MD Authorized by: Corena Herter, MD   Critical care provider statement:    Critical care time (minutes):  30   Critical care time was exclusive of:  Separately billable procedures and treating other patients   Critical care was necessary to treat or prevent imminent or life-threatening deterioration of the following conditions:  Respiratory failure   Critical care was time spent personally by me on the following activities:  Development of treatment plan with patient or surrogate, discussions with consultants, evaluation of patient's response to treatment, examination of patient, ordering and review of laboratory studies, ordering and review of radiographic studies, ordering and performing treatments and interventions, pulse oximetry, re-evaluation of patient's condition and review of old charts   Care discussed with: admitting provider      Patient's presentation is most consistent with severe exacerbation of chronic illness.   MEDICATIONS ORDERED IN ED: Medications  ipratropium-albuterol (DUONEB) 0.5-2.5 (3) MG/3ML nebulizer solution 3 mL (3 mLs Nebulization Given 04/09/23 1602)  ipratropium-albuterol (DUONEB) 0.5-2.5 (3) MG/3ML nebulizer solution 9 mL (9 mLs Nebulization Given 04/09/23 1646)  methylPREDNISolone sodium succinate (SOLU-MEDROL) 125 mg/2 mL injection 125 mg (125 mg Intravenous Given 04/09/23 1653)  magnesium sulfate IVPB 2 g 50 mL (0 g Intravenous Stopped 04/09/23 1720)  albuterol (PROVENTIL) (2.5 MG/3ML) 0.083% nebulizer solution (10 mg/hr Nebulization Given 04/09/23 1802)    FINAL CLINICAL IMPRESSION(S) / ED  DIAGNOSES   Final diagnoses:  Severe persistent asthma with exacerbation     Rx / DC Orders   ED Discharge Orders     None        Note:  This document was prepared using Dragon voice recognition software and may include unintentional dictation errors.   Corena Herter, MD 04/09/23 [phone]

## 2023-04-09 NOTE — ED Triage Notes (Signed)
 Patient recently here and admitted for uncontrolled asthma. Patient returns with audible wheezing and sob that started today.  Denies CP

## 2023-04-10 LAB — CBC
HCT: 39.2 % (ref 39.0–52.0)
Hemoglobin: 12.6 g/dL — ABNORMAL LOW (ref 13.0–17.0)
MCH: 28 pg (ref 26.0–34.0)
MCHC: 32.1 g/dL (ref 30.0–36.0)
MCV: 87.1 fL (ref 80.0–100.0)
Platelets: 376 10*3/uL (ref 150–400)
RBC: 4.5 MIL/uL (ref 4.22–5.81)
RDW: 14.8 % (ref 11.5–15.5)
WBC: 8.6 10*3/uL (ref 4.0–10.5)
nRBC: 0 % (ref 0.0–0.2)

## 2023-04-10 LAB — BASIC METABOLIC PANEL WITH GFR
Anion gap: 8 (ref 5–15)
BUN: 18 mg/dL (ref 6–20)
CO2: 26 mmol/L (ref 22–32)
Calcium: 8.8 mg/dL — ABNORMAL LOW (ref 8.9–10.3)
Chloride: 103 mmol/L (ref 98–111)
Creatinine, Ser: 0.86 mg/dL (ref 0.61–1.24)
GFR, Estimated: >60 mL/min (ref >60)
Glucose, Bld: 151 mg/dL — ABNORMAL HIGH (ref 70–99)
Potassium: 4.7 mmol/L (ref 3.5–5.1)
Sodium: 137 mmol/L (ref 135–145)

## 2023-04-10 LAB — RESPIRATORY PANEL BY PCR

## 2023-04-10 NOTE — ED Notes (Signed)
 Advised nurse that patient has ready bed

## 2023-04-10 NOTE — Progress Notes (Signed)
 PROGRESS NOTE    Justin Reid  ZOX:[id number] DOB: September 17, 1980 DOA: 04/09/2023 PCP: Pcp, No  Assessment & Plan:   Principal Problem:   Acute severe exacerbation of severe persistent asthma Active Problems:   Acute respiratory failure with hypoxia (HCC)   Pleuritic chest pain   OSA (obstructive sleep apnea)   Tobacco abuse   HTN (hypertension)   GERD (gastroesophageal reflux disease)   Obesity, Class III, BMI 40-49.9 (morbid obesity) (HCC)  Assessment and Plan: Acute hypoxic respiratory failure: secondary  acute severe exacerbation of severe persistent asthma. Continue on BiPAP and wean as tolerated. Echo on 03/09/2023 showed EF of 60 to 65%. Continue on bronchodilators, IV steroids, & encourage incentive spirometry. Resp viral panel is neg & COVID19, RSV & influenza are neg as well     Pleuritic chest pain: had CTA which was negative for PE on 04/02/2023, will not repeat CTA today. CXR shows no active disease   OSA: does not use CPAP at home    Tobacco abuse: received smoking cessation counseling already. Nicotine patch to prevent w/drawal    HTN: continue on metoprolol, amlodipine    GERD: continue on PPI    Morbid obesity: BMI 49.2. Complicates overall care & prognosis. Pt refused a heart healthy diet and requests a regular diet   PreDM: HbA1c 6.0. Received preDM education      DVT prophylaxis: lovenox  Code Status: full  Family Communication: discussed pt's care w/ pt's wife at bedside and answered her questions Disposition Plan: likely d/c back home   Level of care: Progressive  Status is: Inpatient Remains inpatient appropriate because: severity of illness    Consultants:    Procedures:   Antimicrobials:    Subjective: Pt c/o shortness of breath   Objective: Vitals:   04/10/23 0000 04/10/23 0400 04/10/23 0639 04/10/23 0646  BP: 135/72 133/82 (!) 153/92   Pulse: 80 63 (!) 58   Resp: 17 17 18    Temp: 98.5 F (36.9 C)   97.8 F (36.6 C)   TempSrc: Oral   Oral  SpO2: 93% 95% 98%   Weight:      Height:        Intake/Output Summary (Last 24 hours) at 04/10/2023 0917 Last data filed at 04/09/2023 1720 Gross per 24 hour  Intake 28.14 ml  Output --  Net 28.14 ml   Filed Weights   04/09/23 1547  Weight: (!) 155.6 kg    Examination:  General exam: Appears calm but uncomfortable. Morbidly obese Respiratory system: diminished breath sounds b/l Cardiovascular system: S1 & S2+. No rubs, gallops or clicks.  Gastrointestinal system: Abdomen is obese, soft and nontender. Normal bowel sounds heard. Central nervous system: Alert and oriented. Moves all extremities  Psychiatry: Judgement and insight appear normal. Mood & affect appropriate.     Data Reviewed: I have personally reviewed following labs and imaging studies  CBC: Recent Labs  Lab 04/09/23 1552 04/10/23 0422  WBC 7.3 8.6  NEUTROABS 4.2  --   HGB 12.7* 12.6*  HCT 38.9* 39.2  MCV 84.0 87.1  PLT 421* 376   Basic Metabolic Panel: Recent Labs  Lab 04/09/23 1552 04/10/23 0422  NA 136 137  K 3.8 4.7  CL 100 103  CO2 26 26  GLUCOSE 100* 151*  BUN 21* 18  CREATININE 0.77 0.86  CALCIUM 8.9 8.8*   GFR: Estimated Creatinine Clearance: 169.5 mL/min (by C-G formula based on SCr of 0.86 mg/dL). Liver Function Tests: No results for input(s): "AST", "  ALT", "ALKPHOS", "BILITOT", "PROT", "ALBUMIN" in the last 168 hours. No results for input(s): "LIPASE", "AMYLASE" in the last 168 hours. No results for input(s): "AMMONIA" in the last 168 hours. Coagulation Profile: No results for input(s): "INR", "PROTIME" in the last 168 hours. Cardiac Enzymes: No results for input(s): "CKTOTAL", "CKMB", "CKMBINDEX", "TROPONINI" in the last 168 hours. BNP (last 3 results) No results for input(s): "PROBNP" in the last 8760 hours. HbA1C: No results for input(s): "HGBA1C" in the last 72 hours. CBG: Recent Labs  Lab 04/05/23 1201 04/05/23 1651 04/05/23 2101 04/06/23 0801  04/06/23 1127  GLUCAP 100* 114* 97 85 123*   Lipid Profile: No results for input(s): "CHOL", "HDL", "LDLCALC", "TRIG", "CHOLHDL", "LDLDIRECT" in the last 72 hours. Thyroid Function Tests: No results for input(s): "TSH", "T4TOTAL", "FREET4", "T3FREE", "THYROIDAB" in the last 72 hours. Anemia Panel: No results for input(s): "VITAMINB12", "FOLATE", "FERRITIN", "TIBC", "IRON", "RETICCTPCT" in the last 72 hours. Sepsis Labs: No results for input(s): "PROCALCITON", "LATICACIDVEN" in the last 168 hours.  Recent Results (from the past 240 hours)  Resp panel by RT-PCR (RSV, Flu A&B, Covid) Anterior Nasal Swab     Status: None   Collection Time: 04/01/23  4:32 PM   Specimen: Anterior Nasal Swab  Result Value Ref Range Status   SARS Coronavirus 2 by RT PCR NEGATIVE NEGATIVE Final    Comment: (NOTE) SARS-CoV-2 target nucleic acids are NOT DETECTED.  The SARS-CoV-2 RNA is generally detectable in upper respiratory specimens during the acute phase of infection. The lowest concentration of SARS-CoV-2 viral copies this assay can detect is 138 copies/mL. A negative result does not preclude SARS-Cov-2 infection and should not be used as the sole basis for treatment or other patient management decisions. A negative result may occur with  improper specimen collection/handling, submission of specimen other than nasopharyngeal swab, presence of viral mutation(s) within the areas targeted by this assay, and inadequate number of viral copies(<138 copies/mL). A negative result must be combined with clinical observations, patient history, and epidemiological information. The expected result is Negative.  Fact Sheet for Patients:  BloggerCourse.com  Fact Sheet for Healthcare Providers:  SeriousBroker.it  This test is no t yet approved or cleared by the [place] FDA and  has been authorized for detection and/or diagnosis of SARS-CoV-2 by FDA under  an Emergency Use Authorization (EUA). This EUA will remain  in effect (meaning this test can be used) for the duration of the COVID-19 declaration under Section 564(b)(1) of the Act, 21 U.S.C.section 360bbb-3(b)(1), unless the authorization is terminated  or revoked sooner.       Influenza A by PCR NEGATIVE NEGATIVE Final   Influenza B by PCR NEGATIVE NEGATIVE Final    Comment: (NOTE) The Xpert Xpress SARS-CoV-2/FLU/RSV plus assay is intended as an aid in the diagnosis of influenza from Nasopharyngeal swab specimens and should not be used as a sole basis for treatment. Nasal washings and aspirates are unacceptable for Xpert Xpress SARS-CoV-2/FLU/RSV testing.  Fact Sheet for Patients: BloggerCourse.com  Fact Sheet for Healthcare Providers: SeriousBroker.it  This test is not yet approved or cleared by the [place] FDA and has been authorized for detection and/or diagnosis of SARS-CoV-2 by FDA under an Emergency Use Authorization (EUA). This EUA will remain in effect (meaning this test can be used) for the duration of the COVID-19 declaration under Section 564(b)(1) of the Act, 21 U.S.C. section 360bbb-3(b)(1), unless the authorization is terminated or revoked.     Resp Syncytial Virus by PCR NEGATIVE NEGATIVE  Final    Comment: (NOTE) Fact Sheet for Patients: BloggerCourse.com  Fact Sheet for Healthcare Providers: SeriousBroker.it  This test is not yet approved or cleared by the [place] FDA and has been authorized for detection and/or diagnosis of SARS-CoV-2 by FDA under an Emergency Use Authorization (EUA). This EUA will remain in effect (meaning this test can be used) for the duration of the COVID-19 declaration under Section 564(b)(1) of the Act, 21 U.S.C. section 360bbb-3(b)(1), unless the authorization is terminated or revoked.  Performed at [facility], [address] [place], [place] [address]   Respiratory (~20 pathogens) panel by PCR     Status: None   Collection Time: 04/01/23  7:31 PM   Specimen: Nasopharyngeal Swab; Respiratory  Result Value Ref Range Status   Adenovirus NOT DETECTED NOT DETECTED Final   Coronavirus 229E NOT DETECTED NOT DETECTED Final    Comment: (NOTE) The Coronavirus on the Respiratory Panel, DOES NOT test for the novel  Coronavirus (2019 nCoV)    Coronavirus HKU1 NOT DETECTED NOT DETECTED Final   Coronavirus NL63 NOT DETECTED NOT DETECTED Final   Coronavirus OC43 NOT DETECTED NOT DETECTED Final   Metapneumovirus NOT DETECTED NOT DETECTED Final   Rhinovirus / Enterovirus NOT DETECTED NOT DETECTED Final   Influenza A NOT DETECTED NOT DETECTED Final   Influenza B NOT DETECTED NOT DETECTED Final   Parainfluenza Virus 1 NOT DETECTED NOT DETECTED Final   Parainfluenza Virus 2 NOT DETECTED NOT DETECTED Final   Parainfluenza Virus 3 NOT DETECTED NOT DETECTED Final   Parainfluenza Virus 4 NOT DETECTED NOT DETECTED Final   Respiratory Syncytial Virus NOT DETECTED NOT DETECTED Final   Bordetella pertussis NOT DETECTED NOT DETECTED Final   Bordetella Parapertussis NOT DETECTED NOT DETECTED Final   Chlamydophila pneumoniae NOT DETECTED NOT DETECTED Final   Mycoplasma pneumoniae NOT DETECTED NOT DETECTED Final    Comment: Performed at [facility], [address] [place], [place] [address]  Resp panel by RT-PCR (RSV, Flu A&B, Covid) Anterior Nasal Swab     Status: None   Collection Time: 04/09/23  4:51 PM   Specimen: Anterior Nasal Swab  Result Value Ref Range Status   SARS Coronavirus 2 by RT PCR NEGATIVE NEGATIVE Final    Comment: (NOTE) SARS-CoV-2 target nucleic acids are NOT DETECTED.  The SARS-CoV-2 RNA is generally detectable in upper respiratory specimens during the acute phase of infection. The lowest concentration of SARS-CoV-2 viral copies this assay can detect is 138 copies/mL. A  negative result does not preclude SARS-Cov-2 infection and should not be used as the sole basis for treatment or other patient management decisions. A negative result may occur with  improper specimen collection/handling, submission of specimen other than nasopharyngeal swab, presence of viral mutation(s) within the areas targeted by this assay, and inadequate number of viral copies(<138 copies/mL). A negative result must be combined with clinical observations, patient history, and epidemiological information. The expected result is Negative.  Fact Sheet for Patients:  BloggerCourse.com  Fact Sheet for Healthcare Providers:  SeriousBroker.it  This test is no t yet approved or cleared by the [place] FDA and  has been authorized for detection and/or diagnosis of SARS-CoV-2 by FDA under an Emergency Use Authorization (EUA). This EUA will remain  in effect (meaning this test can be used) for the duration of the COVID-19 declaration under Section 564(b)(1) of the Act, 21 U.S.C.section 360bbb-3(b)(1), unless the authorization is terminated  or revoked sooner.  Influenza A by PCR NEGATIVE NEGATIVE Final   Influenza B by PCR NEGATIVE NEGATIVE Final    Comment: (NOTE) The Xpert Xpress SARS-CoV-2/FLU/RSV plus assay is intended as an aid in the diagnosis of influenza from Nasopharyngeal swab specimens and should not be used as a sole basis for treatment. Nasal washings and aspirates are unacceptable for Xpert Xpress SARS-CoV-2/FLU/RSV testing.  Fact Sheet for Patients: BloggerCourse.com  Fact Sheet for Healthcare Providers: SeriousBroker.it  This test is not yet approved or cleared by the [place] FDA and has been authorized for detection and/or diagnosis of SARS-CoV-2 by FDA under an Emergency Use Authorization (EUA). This EUA will remain in effect (meaning this test can  be used) for the duration of the COVID-19 declaration under Section 564(b)(1) of the Act, 21 U.S.C. section 360bbb-3(b)(1), unless the authorization is terminated or revoked.     Resp Syncytial Virus by PCR NEGATIVE NEGATIVE Final    Comment: (NOTE) Fact Sheet for Patients: BloggerCourse.com  Fact Sheet for Healthcare Providers: SeriousBroker.it  This test is not yet approved or cleared by the [place] FDA and has been authorized for detection and/or diagnosis of SARS-CoV-2 by FDA under an Emergency Use Authorization (EUA). This EUA will remain in effect (meaning this test can be used) for the duration of the COVID-19 declaration under Section 564(b)(1) of the Act, 21 U.S.C. section 360bbb-3(b)(1), unless the authorization is terminated or revoked.  Performed at [facility], [address] [place], [place] [address]   Respiratory (~20 pathogens) panel by PCR     Status: None   Collection Time: 04/09/23  8:14 PM   Specimen: Nasopharyngeal Swab; Respiratory  Result Value Ref Range Status   Adenovirus NOT DETECTED NOT DETECTED Final   Coronavirus 229E NOT DETECTED NOT DETECTED Final    Comment: (NOTE) The Coronavirus on the Respiratory Panel, DOES NOT test for the novel  Coronavirus (2019 nCoV)    Coronavirus HKU1 NOT DETECTED NOT DETECTED Final   Coronavirus NL63 NOT DETECTED NOT DETECTED Final   Coronavirus OC43 NOT DETECTED NOT DETECTED Final   Metapneumovirus NOT DETECTED NOT DETECTED Final   Rhinovirus / Enterovirus NOT DETECTED NOT DETECTED Final   Influenza A NOT DETECTED NOT DETECTED Final   Influenza B NOT DETECTED NOT DETECTED Final   Parainfluenza Virus 1 NOT DETECTED NOT DETECTED Final   Parainfluenza Virus 2 NOT DETECTED NOT DETECTED Final   Parainfluenza Virus 3 NOT DETECTED NOT DETECTED Final   Parainfluenza Virus 4 NOT DETECTED NOT DETECTED Final   Respiratory Syncytial Virus NOT DETECTED NOT  DETECTED Final   Bordetella pertussis NOT DETECTED NOT DETECTED Final   Bordetella Parapertussis NOT DETECTED NOT DETECTED Final   Chlamydophila pneumoniae NOT DETECTED NOT DETECTED Final   Mycoplasma pneumoniae NOT DETECTED NOT DETECTED Final    Comment: Performed at [facility], [address] [place], [place] [address]         Radiology Studies: DG Chest Portable 1 View Result Date: 04/09/2023 CLINICAL DATA:  Cough. EXAM: PORTABLE CHEST 1 VIEW COMPARISON:  Chest radiograph dated 04/01/2023 and CT dated 04/02/2023. FINDINGS: No focal consolidation, pleural effusion, pneumothorax. The cardiac silhouette is within limits. No acute osseous pathology. IMPRESSION: No active disease. Electronically Signed   By: Elgie Collard M.D.   On: 04/09/2023 17:10        Scheduled Meds:  amLODipine  5 mg Oral Daily   diphenhydrAMINE  25 mg Oral QHS   enoxaparin (LOVENOX) injection  0.5 mg/kg Subcutaneous  Q24H   furosemide  40 mg Intravenous Once   ipratropium-albuterol  3 mL Nebulization Q4H   loratadine  10 mg Oral Daily   methylPREDNISolone (SOLU-MEDROL) injection  80 mg Intravenous Daily   metoprolol succinate  50 mg Oral Daily   mometasone-formoterol  2 puff Inhalation BID   montelukast  10 mg Oral Daily   nicotine  21 mg Transdermal Daily   pantoprazole  40 mg Oral Daily   Continuous Infusions:   LOS: 1 day      Charise Killian, MD [facility] Pager [phone]  If 7PM-7AM, please contact night-coverage [email or web address] 04/10/2023, 9:17 AM

## 2023-04-11 ENCOUNTER — Inpatient Hospital Stay: Payer: MEDICAID

## 2023-04-11 LAB — CBC
HCT: 37.5 % — ABNORMAL LOW (ref 39.0–52.0)
Hemoglobin: 12.1 g/dL — ABNORMAL LOW (ref 13.0–17.0)
MCH: 27.9 pg (ref 26.0–34.0)
MCHC: 32.3 g/dL (ref 30.0–36.0)
MCV: 86.6 fL (ref 80.0–100.0)
Platelets: 361 10*3/uL (ref 150–400)
RBC: 4.33 MIL/uL (ref 4.22–5.81)
RDW: 14.9 % (ref 11.5–15.5)
WBC: 13.9 10*3/uL — ABNORMAL HIGH (ref 4.0–10.5)
nRBC: 0 % (ref 0.0–0.2)

## 2023-04-11 LAB — BASIC METABOLIC PANEL WITH GFR
Anion gap: 8 (ref 5–15)
BUN: 22 mg/dL — ABNORMAL HIGH (ref 6–20)
CO2: 26 mmol/L (ref 22–32)
Calcium: 9.1 mg/dL (ref 8.9–10.3)
Chloride: 104 mmol/L (ref 98–111)
Creatinine, Ser: 0.95 mg/dL (ref 0.61–1.24)
GFR, Estimated: >60 mL/min (ref >60)
Glucose, Bld: 120 mg/dL — ABNORMAL HIGH (ref 70–99)
Potassium: 4.4 mmol/L (ref 3.5–5.1)
Sodium: 138 mmol/L (ref 135–145)

## 2023-04-11 MED ORDER — IPRATROPIUM-ALBUTEROL 0.5-2.5 (3) MG/3ML IN SOLN
3.0000 mL | Freq: Four times a day (QID) | RESPIRATORY_TRACT | Status: DC
  Administered 2023-04-11 – 2023-04-12 (×2): 3 mL via RESPIRATORY_TRACT
  Filled 2023-04-11 (×2): qty 3

## 2023-04-11 NOTE — Progress Notes (Signed)
 PROGRESS NOTE    Justin Reid  KGM:[id number] DOB: 1980/05/26 DOA: 04/09/2023 PCP: Pcp, No  Assessment & Plan:   Principal Problem:   Acute severe exacerbation of severe persistent asthma Active Problems:   Acute respiratory failure with hypoxia (HCC)   Pleuritic chest pain   OSA (obstructive sleep apnea)   Tobacco abuse   HTN (hypertension)   GERD (gastroesophageal reflux disease)   Obesity, Class III, BMI 40-49.9 (morbid obesity) (HCC)  Assessment and Plan: Acute hypoxic respiratory failure: still w/ dyspnea today. Secondary acute severe exacerbation of severe persistent asthma. Weaned off of BiPAP. Continue on supplemental oxygen and wean as tolerated. Echo on 03/09/2023 showed EF of 60 to 65%. Continue on bronchodilators, IV steroids & encourage incentive spirometry. Resp viral panel is neg & COVID19, RSV & influenza are neg as well     Pleuritic chest pain: had CTA which was negative for PE on 04/02/2023, will not repeat CTA. CXR shows no active disease. Resolved    OSA: does not use CPAP at home     Tobacco abuse: received smoking cessation counseling already. Nicotine patch to prevent w/drawl   HTN: continue on amlodipine, metoprolol    GERD: continue on PPI    Morbid obesity: BMI 49.2. Complicates overall care & prognosis. Pt refused a heart healthy diet and requests a regular diet   PreDM: HbA1c 6.0. Received pre-DM education      DVT prophylaxis: lovenox  Code Status: full  Family Communication: discussed pt's care w/ pt's wife at bedside and answered her questions Disposition Plan: likely d/c back home   Level of care: Progressive  Status is: Inpatient Remains inpatient appropriate because: severity of illness    Consultants:    Procedures:   Antimicrobials:    Subjective: Pt c/o shortness of breath & low back pain   Objective: Vitals:   04/11/23 0000 04/11/23 0425 04/11/23 0733 04/11/23 0854  BP: 127/64 124/78  (!) 166/95  Pulse: 85 70  82   Resp: 19 18  16   Temp: 98.2 F (36.8 C) 98.2 F (36.8 C)  98.1 F (36.7 C)  TempSrc: Oral Oral    SpO2: 98% 99% 94% 95%  Weight:      Height:       No intake or output data in the 24 hours ending 04/11/23 0900  Filed Weights   04/09/23 1547  Weight: (!) 155.6 kg    Examination:  General exam: appears comfortable. Morbidly obese Respiratory system: decreased breath sounds b/l  Cardiovascular system: S1/S2+. No rubs or gallops  Gastrointestinal system: abd is soft, NT, obese & hypoactive bowel sounds  Central nervous system: Alert & oriented. Moves all extremities  Psychiatry: judgement and insight appears normal. Flat mood and affect    Data Reviewed: I have personally reviewed following labs and imaging studies  CBC: Recent Labs  Lab 04/09/23 1552 04/10/23 0422 04/11/23 0704  WBC 7.3 8.6 13.9*  NEUTROABS 4.2  --   --   HGB 12.7* 12.6* 12.1*  HCT 38.9* 39.2 37.5*  MCV 84.0 87.1 86.6  PLT 421* 376 361   Basic Metabolic Panel: Recent Labs  Lab 04/09/23 1552 04/10/23 0422 04/11/23 0704  NA 136 137 138  K 3.8 4.7 4.4  CL 100 103 104  CO2 26 26 26   GLUCOSE 100* 151* 120*  BUN 21* 18 22*  CREATININE 0.77 0.86 0.95  CALCIUM 8.9 8.8* 9.1   GFR: Estimated Creatinine Clearance: 153.4 mL/min (by C-G formula based on SCr  of 0.95 mg/dL). Liver Function Tests: No results for input(s): "AST", "ALT", "ALKPHOS", "BILITOT", "PROT", "ALBUMIN" in the last 168 hours. No results for input(s): "LIPASE", "AMYLASE" in the last 168 hours. No results for input(s): "AMMONIA" in the last 168 hours. Coagulation Profile: No results for input(s): "INR", "PROTIME" in the last 168 hours. Cardiac Enzymes: No results for input(s): "CKTOTAL", "CKMB", "CKMBINDEX", "TROPONINI" in the last 168 hours. BNP (last 3 results) No results for input(s): "PROBNP" in the last 8760 hours. HbA1C: No results for input(s): "HGBA1C" in the last 72 hours. CBG: Recent Labs  Lab 04/05/23 1201  04/05/23 1651 04/05/23 2101 04/06/23 0801 04/06/23 1127  GLUCAP 100* 114* 97 85 123*   Lipid Profile: No results for input(s): "CHOL", "HDL", "LDLCALC", "TRIG", "CHOLHDL", "LDLDIRECT" in the last 72 hours. Thyroid Function Tests: No results for input(s): "TSH", "T4TOTAL", "FREET4", "T3FREE", "THYROIDAB" in the last 72 hours. Anemia Panel: No results for input(s): "VITAMINB12", "FOLATE", "FERRITIN", "TIBC", "IRON", "RETICCTPCT" in the last 72 hours. Sepsis Labs: No results for input(s): "PROCALCITON", "LATICACIDVEN" in the last 168 hours.  Recent Results (from the past 240 hours)  Resp panel by RT-PCR (RSV, Flu A&B, Covid) Anterior Nasal Swab     Status: None   Collection Time: 04/01/23  4:32 PM   Specimen: Anterior Nasal Swab  Result Value Ref Range Status   SARS Coronavirus 2 by RT PCR NEGATIVE NEGATIVE Final    Comment: (NOTE) SARS-CoV-2 target nucleic acids are NOT DETECTED.  The SARS-CoV-2 RNA is generally detectable in upper respiratory specimens during the acute phase of infection. The lowest concentration of SARS-CoV-2 viral copies this assay can detect is 138 copies/mL. A negative result does not preclude SARS-Cov-2 infection and should not be used as the sole basis for treatment or other patient management decisions. A negative result may occur with  improper specimen collection/handling, submission of specimen other than nasopharyngeal swab, presence of viral mutation(s) within the areas targeted by this assay, and inadequate number of viral copies(<138 copies/mL). A negative result must be combined with clinical observations, patient history, and epidemiological information. The expected result is Negative.  Fact Sheet for Patients:  BloggerCourse.com  Fact Sheet for Healthcare Providers:  SeriousBroker.it  This test is no t yet approved or cleared by the [place] FDA and  has been authorized for detection  and/or diagnosis of SARS-CoV-2 by FDA under an Emergency Use Authorization (EUA). This EUA will remain  in effect (meaning this test can be used) for the duration of the COVID-19 declaration under Section 564(b)(1) of the Act, 21 U.S.C.section 360bbb-3(b)(1), unless the authorization is terminated  or revoked sooner.       Influenza A by PCR NEGATIVE NEGATIVE Final   Influenza B by PCR NEGATIVE NEGATIVE Final    Comment: (NOTE) The Xpert Xpress SARS-CoV-2/FLU/RSV plus assay is intended as an aid in the diagnosis of influenza from Nasopharyngeal swab specimens and should not be used as a sole basis for treatment. Nasal washings and aspirates are unacceptable for Xpert Xpress SARS-CoV-2/FLU/RSV testing.  Fact Sheet for Patients: BloggerCourse.com  Fact Sheet for Healthcare Providers: SeriousBroker.it  This test is not yet approved or cleared by the [place] FDA and has been authorized for detection and/or diagnosis of SARS-CoV-2 by FDA under an Emergency Use Authorization (EUA). This EUA will remain in effect (meaning this test can be used) for the duration of the COVID-19 declaration under Section 564(b)(1) of the Act, 21 U.S.C. section 360bbb-3(b)(1), unless the authorization is terminated or revoked.  Resp Syncytial Virus by PCR NEGATIVE NEGATIVE Final    Comment: (NOTE) Fact Sheet for Patients: BloggerCourse.com  Fact Sheet for Healthcare Providers: SeriousBroker.it  This test is not yet approved or cleared by the [place] FDA and has been authorized for detection and/or diagnosis of SARS-CoV-2 by FDA under an Emergency Use Authorization (EUA). This EUA will remain in effect (meaning this test can be used) for the duration of the COVID-19 declaration under Section 564(b)(1) of the Act, 21 U.S.C. section 360bbb-3(b)(1), unless the authorization is terminated  or revoked.  Performed at [facility], [address] [place], [place] [address]   Respiratory (~20 pathogens) panel by PCR     Status: None   Collection Time: 04/01/23  7:31 PM   Specimen: Nasopharyngeal Swab; Respiratory  Result Value Ref Range Status   Adenovirus NOT DETECTED NOT DETECTED Final   Coronavirus 229E NOT DETECTED NOT DETECTED Final    Comment: (NOTE) The Coronavirus on the Respiratory Panel, DOES NOT test for the novel  Coronavirus (2019 nCoV)    Coronavirus HKU1 NOT DETECTED NOT DETECTED Final   Coronavirus NL63 NOT DETECTED NOT DETECTED Final   Coronavirus OC43 NOT DETECTED NOT DETECTED Final   Metapneumovirus NOT DETECTED NOT DETECTED Final   Rhinovirus / Enterovirus NOT DETECTED NOT DETECTED Final   Influenza A NOT DETECTED NOT DETECTED Final   Influenza B NOT DETECTED NOT DETECTED Final   Parainfluenza Virus 1 NOT DETECTED NOT DETECTED Final   Parainfluenza Virus 2 NOT DETECTED NOT DETECTED Final   Parainfluenza Virus 3 NOT DETECTED NOT DETECTED Final   Parainfluenza Virus 4 NOT DETECTED NOT DETECTED Final   Respiratory Syncytial Virus NOT DETECTED NOT DETECTED Final   Bordetella pertussis NOT DETECTED NOT DETECTED Final   Bordetella Parapertussis NOT DETECTED NOT DETECTED Final   Chlamydophila pneumoniae NOT DETECTED NOT DETECTED Final   Mycoplasma pneumoniae NOT DETECTED NOT DETECTED Final    Comment: Performed at [facility], [address], [place], [place] [address]  Resp panel by RT-PCR (RSV, Flu A&B, Covid) Anterior Nasal Swab     Status: None   Collection Time: 04/09/23  4:51 PM   Specimen: Anterior Nasal Swab  Result Value Ref Range Status   SARS Coronavirus 2 by RT PCR NEGATIVE NEGATIVE Final    Comment: (NOTE) SARS-CoV-2 target nucleic acids are NOT DETECTED.  The SARS-CoV-2 RNA is generally detectable in upper respiratory specimens during the acute phase of infection. The lowest concentration of SARS-CoV-2 viral copies  this assay can detect is 138 copies/mL. A negative result does not preclude SARS-Cov-2 infection and should not be used as the sole basis for treatment or other patient management decisions. A negative result may occur with  improper specimen collection/handling, submission of specimen other than nasopharyngeal swab, presence of viral mutation(s) within the areas targeted by this assay, and inadequate number of viral copies(<138 copies/mL). A negative result must be combined with clinical observations, patient history, and epidemiological information. The expected result is Negative.  Fact Sheet for Patients:  BloggerCourse.com  Fact Sheet for Healthcare Providers:  SeriousBroker.it  This test is no t yet approved or cleared by the [place] FDA and  has been authorized for detection and/or diagnosis of SARS-CoV-2 by FDA under an Emergency Use Authorization (EUA). This EUA will remain  in effect (meaning this test can be used) for the duration of the COVID-19 declaration under Section 564(b)(1) of the Act, 21 U.S.C.section 360bbb-3(b)(1), unless the authorization is terminated  or revoked sooner.       Influenza A by PCR NEGATIVE NEGATIVE Final   Influenza B by PCR NEGATIVE NEGATIVE Final    Comment: (NOTE) The Xpert Xpress SARS-CoV-2/FLU/RSV plus assay is intended as an aid in the diagnosis of influenza from Nasopharyngeal swab specimens and should not be used as a sole basis for treatment. Nasal washings and aspirates are unacceptable for Xpert Xpress SARS-CoV-2/FLU/RSV testing.  Fact Sheet for Patients: BloggerCourse.com  Fact Sheet for Healthcare Providers: SeriousBroker.it  This test is not yet approved or cleared by the [place] FDA and has been authorized for detection and/or diagnosis of SARS-CoV-2 by FDA under an Emergency Use Authorization (EUA). This EUA  will remain in effect (meaning this test can be used) for the duration of the COVID-19 declaration under Section 564(b)(1) of the Act, 21 U.S.C. section 360bbb-3(b)(1), unless the authorization is terminated or revoked.     Resp Syncytial Virus by PCR NEGATIVE NEGATIVE Final    Comment: (NOTE) Fact Sheet for Patients: BloggerCourse.com  Fact Sheet for Healthcare Providers: SeriousBroker.it  This test is not yet approved or cleared by the [place] FDA and has been authorized for detection and/or diagnosis of SARS-CoV-2 by FDA under an Emergency Use Authorization (EUA). This EUA will remain in effect (meaning this test can be used) for the duration of the COVID-19 declaration under Section 564(b)(1) of the Act, 21 U.S.C. section 360bbb-3(b)(1), unless the authorization is terminated or revoked.  Performed at [facility], [address] [place], [place] [address]   Respiratory (~20 pathogens) panel by PCR     Status: None   Collection Time: 04/09/23  8:14 PM   Specimen: Nasopharyngeal Swab; Respiratory  Result Value Ref Range Status   Adenovirus NOT DETECTED NOT DETECTED Final   Coronavirus 229E NOT DETECTED NOT DETECTED Final    Comment: (NOTE) The Coronavirus on the Respiratory Panel, DOES NOT test for the novel  Coronavirus (2019 nCoV)    Coronavirus HKU1 NOT DETECTED NOT DETECTED Final   Coronavirus NL63 NOT DETECTED NOT DETECTED Final   Coronavirus OC43 NOT DETECTED NOT DETECTED Final   Metapneumovirus NOT DETECTED NOT DETECTED Final   Rhinovirus / Enterovirus NOT DETECTED NOT DETECTED Final   Influenza A NOT DETECTED NOT DETECTED Final   Influenza B NOT DETECTED NOT DETECTED Final   Parainfluenza Virus 1 NOT DETECTED NOT DETECTED Final   Parainfluenza Virus 2 NOT DETECTED NOT DETECTED Final   Parainfluenza Virus 3 NOT DETECTED NOT DETECTED Final   Parainfluenza Virus 4 NOT DETECTED NOT DETECTED Final    Respiratory Syncytial Virus NOT DETECTED NOT DETECTED Final   Bordetella pertussis NOT DETECTED NOT DETECTED Final   Bordetella Parapertussis NOT DETECTED NOT DETECTED Final   Chlamydophila pneumoniae NOT DETECTED NOT DETECTED Final   Mycoplasma pneumoniae NOT DETECTED NOT DETECTED Final    Comment: Performed at [facility], [address], [place], [place] [address]         Radiology Studies: DG Chest Portable 1 View Result Date: 04/09/2023 CLINICAL DATA:  Cough. EXAM: PORTABLE CHEST 1 VIEW COMPARISON:  Chest radiograph dated 04/01/2023 and CT dated 04/02/2023. FINDINGS: No focal consolidation, pleural effusion, pneumothorax. The cardiac silhouette is within limits. No acute osseous pathology. IMPRESSION: No active disease. Electronically Signed   By: Elgie Collard M.D.   On: 04/09/2023 17:10        Scheduled Meds:  amLODipine  5 mg Oral Daily   diphenhydrAMINE  25 mg Oral QHS  enoxaparin (LOVENOX) injection  0.5 mg/kg Subcutaneous Q24H   furosemide  40 mg Intravenous Once   ipratropium-albuterol  3 mL Nebulization Q6H   loratadine  10 mg Oral Daily   methylPREDNISolone (SOLU-MEDROL) injection  80 mg Intravenous Daily   metoprolol succinate  50 mg Oral Daily   mometasone-formoterol  2 puff Inhalation BID   montelukast  10 mg Oral Daily   nicotine  21 mg Transdermal Daily   pantoprazole  40 mg Oral Daily   Continuous Infusions:   LOS: 2 days      Charise Killian, MD [facility] Pager [phone]  If 7PM-7AM, please contact night-coverage [email or web address] 04/11/2023, 9:00 AM

## 2023-04-12 ENCOUNTER — Other Ambulatory Visit: Payer: Self-pay

## 2023-04-12 LAB — BASIC METABOLIC PANEL WITH GFR
Anion gap: 8 (ref 5–15)
BUN: 24 mg/dL — ABNORMAL HIGH (ref 6–20)
CO2: 28 mmol/L (ref 22–32)
Calcium: 8.9 mg/dL (ref 8.9–10.3)
Chloride: 101 mmol/L (ref 98–111)
Creatinine, Ser: 0.92 mg/dL (ref 0.61–1.24)
GFR, Estimated: >60 mL/min (ref >60)
Glucose, Bld: 110 mg/dL — ABNORMAL HIGH (ref 70–99)
Potassium: 4.3 mmol/L (ref 3.5–5.1)
Sodium: 137 mmol/L (ref 135–145)

## 2023-04-12 LAB — CBC
HCT: 39.9 % (ref 39.0–52.0)
Hemoglobin: 12.6 g/dL — ABNORMAL LOW (ref 13.0–17.0)
MCH: 27.5 pg (ref 26.0–34.0)
MCHC: 31.6 g/dL (ref 30.0–36.0)
MCV: 87.1 fL (ref 80.0–100.0)
Platelets: 385 10*3/uL (ref 150–400)
RBC: 4.58 MIL/uL (ref 4.22–5.81)
RDW: 15.1 % (ref 11.5–15.5)
WBC: 15.3 10*3/uL — ABNORMAL HIGH (ref 4.0–10.5)
nRBC: 0 % (ref 0.0–0.2)

## 2023-04-12 MED ORDER — PREDNISONE 20 MG PO TABS
ORAL_TABLET | ORAL | 0 refills | Status: AC
  Filled 2023-04-12: qty 12, 6d supply, fill #0

## 2023-04-12 NOTE — Progress Notes (Signed)
 Ambulating o2 evaluation done, patient spo2 95% on room air, patient walked with this nurse around the nursing station X 1 with no drop in oxygen levels, maintained 95 to 96 percent while walking, patient denies any shortness of breath or difficulties breathing at this time.

## 2023-04-12 NOTE — Discharge Summary (Signed)
 Physician Discharge Summary  Justin Reid:[id number] DOB: [birth year] DOA: 04/09/2023  PCP: Pcp, No  Admit date: 04/09/2023 Discharge date: 04/12/2023  Admitted From: home  Disposition:  home   Recommendations for Outpatient Follow-up:  Follow up with PCP in 1-2 weeks F/u w/ pulmon in 1-2 weeks   Home Health: no  Equipment/Devices:  Discharge Condition: stable  CODE STATUS:full  Diet recommendation: Carb Modified   Brief/Interim Summary: HPI was taken from Dr. Clyde Lundborg:  Justin Reid is a 43 y.o. male with medical history significant of  tobacco abuse, asthma, HTN, vocal cord dysfunction, OSA on CPAP, morbid obesity with BMI 49.22, who presents with shortness of breath.   Pt was recently hospitalized from 3/28 - 4/2 due to asthma exacerbation.  Patient had CTA on 04/02/2023 which was negative for PE.  Patient was discharged at the improved condition.  He states in the past 2 days, his shortness of breath has been progressively worsening.  Patient has dry cough, no fever or chills.  He also reports chest pain, which is located in front chest, intermittent, initially severe, currently mild, pleuritic, aggravated by deep breath.  No tenderness in the calf areas.  Patient does not have nausea, vomiting, diarrhea or abdominal pain.  No symptoms of UTI.   Patient was found to have severe respiratory distress, using accessory muscle for breathing, could not speak in full sentence, BiPAP is started in ED.   Data reviewed independently and ED Course: pt was found to have WBC 7.3, BNP 4.5, negative PCR for COVID, flu and RSV, troponin 6, GFR> 60, temperature normal, blood pressure 148/112, heart rate 92, RR 22.  VBG with pH 7.48, CO2 40 and O2 32.  Patient is admitted to PCU as inpatient  Discharge Diagnoses:  Principal Problem:   Acute severe exacerbation of severe persistent asthma Active Problems:   Acute respiratory failure with hypoxia (HCC)   Pleuritic chest pain   OSA (obstructive  sleep apnea)   Tobacco abuse   HTN (hypertension)   GERD (gastroesophageal reflux disease)   Obesity, Class III, BMI 40-49.9 (morbid obesity) (HCC) Acute hypoxic respiratory failure:  Secondary acute severe exacerbation of severe persistent asthma. Weaned off of BiPAP. Weaned off of supplemental oxygen. Echo on 03/09/2023 showed EF of 60 to 65%. Continue on bronchodilators, steroids & encourage incentive spirometry. Resp viral panel is neg & COVID19, RSV & influenza are neg as well     Pleuritic chest pain: had CTA which was negative for PE on 04/02/2023, will not repeat CTA. CXR shows no active disease. Resolved    OSA: does not use CPAP at home     Tobacco abuse: received smoking cessation counseling already. Nicotine patch to prevent w/drawl   HTN: continue on amlodipine, metoprolol    GERD: continue on PPI    Morbid obesity: BMI 49.2. Complicates overall care & prognosis. Pt refused a heart healthy diet and requests a regular diet   PreDM: HbA1c 6.0. Received pre-DM education    Discharge Instructions  Discharge Instructions     Diet Carb Modified   Complete by: As directed    Discharge instructions   Complete by: As directed    F/u w/ PCP in 1-2 weeks. F/u w/ pulmon in 1-2 weeks   Increase activity slowly   Complete by: As directed       Allergies as of 04/12/2023   No Known Allergies      Medication List     TAKE these medications  acetaminophen 325 MG tablet Commonly known as: TYLENOL Take 2 tablets (650 mg total) by mouth every 6 (six) hours as needed for mild pain (pain score 1-3) or fever.   albuterol 108 (90 Base) MCG/ACT inhaler Commonly known as: VENTOLIN HFA Inhale 2 puffs into the lungs every 4 (four) hours as needed for wheezing or shortness of breath.   amLODipine 5 MG tablet Commonly known as: NORVASC Take 1 tablet (5 mg total) by mouth daily.   Dextromethorphan-guaiFENesin 20-200 MG/20ML Liqd Take 10 mLs by mouth every 6 (six) hours as  needed.   loratadine 10 MG tablet Commonly known as: CLARITIN Take 1 tablet (10 mg total) by mouth daily.   metoprolol succinate 50 MG 24 hr tablet Commonly known as: TOPROL-XL Take 1 tablet (50 mg total) by mouth daily. Take with or immediately following a meal.   montelukast 10 MG tablet Commonly known as: SINGULAIR Take 1 tablet (10 mg total) by mouth daily.   nicotine 14 mg/24hr patch Commonly known as: NICODERM CQ - dosed in mg/24 hours Place 1 patch (14 mg total) onto the skin daily as needed (nicotine craving).   oxyCODONE 5 MG immediate release tablet Commonly known as: Oxy IR/ROXICODONE Take 1 tablet (5 mg total) by mouth every 6 (six) hours as needed for moderate pain (pain score 4-6) or severe pain (pain score 7-10).   pantoprazole 40 MG tablet Commonly known as: PROTONIX Take 1 tablet (40 mg total) by mouth daily.   predniSONE 20 MG tablet Commonly known as: DELTASONE 60 mg daily x 2 days, 40mg  daily x 2 days, 20mg  daily x 2 days then stop What changed:  how much to take how to take this when to take this additional instructions   Wixela Inhub 250-50 MCG/ACT Aepb Generic drug: fluticasone-salmeterol Inhale 1 puff into the lungs in the morning and at bedtime.        No Known Allergies  Consultations:   Procedures/Studies: DG Lumbar Spine 2-3 Views Result Date: 04/11/2023 CLINICAL DATA:  Low back pain. EXAM: LUMBAR SPINE - 2-3 VIEW COMPARISON:  None Available. FINDINGS: Five non-rib-bearing lumbar vertebra. Normal lumbar alignment. Anterior spurring at T12-L1. Slight L3-L4 disc space narrowing. No evidence of fracture, pars defects or focal bone abnormality. Sacroiliac joints are congruent. IMPRESSION: Mild degenerative disc disease at T12-L1 and L3-L4. Electronically Signed   By: Narda Rutherford M.D.   On: 04/11/2023 16:29   DG Chest Portable 1 View Result Date: 04/09/2023 CLINICAL DATA:  Cough. EXAM: PORTABLE CHEST 1 VIEW COMPARISON:  Chest radiograph  dated 04/01/2023 and CT dated 04/02/2023. FINDINGS: No focal consolidation, pleural effusion, pneumothorax. The cardiac silhouette is within limits. No acute osseous pathology. IMPRESSION: No active disease. Electronically Signed   By: Elgie Collard M.D.   On: 04/09/2023 17:10   CT Angio Chest Pulmonary Embolism (PE) W or WO Contrast Result Date: 04/02/2023 CLINICAL DATA:  Wheezing and shortness of breath EXAM: CT ANGIOGRAPHY CHEST WITH CONTRAST TECHNIQUE: Multidetector CT imaging of the chest was performed using the standard protocol during bolus administration of intravenous contrast. Multiplanar CT image reconstructions and MIPs were obtained to evaluate the vascular anatomy. RADIATION DOSE REDUCTION: This exam was performed according to the departmental dose-optimization program which includes automated exposure control, adjustment of the mA and/or kV according to patient size and/or use of iterative reconstruction technique. CONTRAST:  OMNIPAQUE IOHEXOL 350 MG/ML SOLN COMPARISON:  Same day chest radiograph and CTA chest 02/21/2023 FINDINGS: Cardiovascular: Negative for acute pulmonary embolism. Normal caliber  thoracic aorta. Aberrant right subclavian artery. No pericardial effusion. Mediastinum/Nodes: Trachea and esophagus are unremarkable. No thoracic adenopathy Lungs/Pleura: No focal consolidation, pleural effusion, or pneumothorax. Upper Abdomen: No acute abnormality. Musculoskeletal: No acute fracture. Review of the MIP images confirms the above findings. IMPRESSION: Negative for acute pulmonary embolism. No acute abnormality in the chest. Electronically Signed   By: Minerva Fester M.D.   On: 04/02/2023 02:04   DG Chest 2 View Result Date: 04/01/2023 CLINICAL DATA:  Wheezing and shortness of breath. EXAM: CHEST - 2 VIEW COMPARISON:  Chest x-ray dated March 24, 2023. FINDINGS: The heart size and mediastinal contours are within normal limits. Normal pulmonary vascularity. No focal  consolidation, pleural effusion, or pneumothorax. No acute osseous abnormality. Advanced degenerative changes of the left shoulder with multiple glenohumeral intra-articular bodies again noted. IMPRESSION: 1. No acute cardiopulmonary disease. Electronically Signed   By: Obie Dredge M.D.   On: 04/01/2023 17:10   CT SOFT TISSUE NECK W CONTRAST Result Date: 03/26/2023 CLINICAL DATA:  Initial evaluation for epiglottitis or tonsillitis suspected. EXAM: CT NECK WITH CONTRAST TECHNIQUE: Multidetector CT imaging of the neck was performed using the standard protocol following the bolus administration of intravenous contrast. RADIATION DOSE REDUCTION: This exam was performed according to the departmental dose-optimization program which includes automated exposure control, adjustment of the mA and/or kV according to patient size and/or use of iterative reconstruction technique. CONTRAST:  75mL OMNIPAQUE IOHEXOL 300 MG/ML  SOLN COMPARISON:  None Available. FINDINGS: Pharynx and larynx: Oral cavity within normal limits. Palatine tonsils are symmetric and within normal limits. No evidence for acute tonsillitis by CT. Remainder of the oropharynx and nasopharynx within normal limits. Negative epiglottis. Hypopharynx and supraglottic larynx within normal limits. No retropharyngeal collection or swelling. Negative glottis. Six visualized subglottic airway clear. Salivary glands: Salivary glands including the parotid and submandibular glands are within normal limits. Thyroid: Visualized thyroid normal. Lymph nodes: No enlarged or pathologic adenopathy within the neck. Vascular: Normal intravascular enhancement seen within the neck. Limited intracranial: Unremarkable. Visualized orbits: Globes orbital soft tissues within normal limits. Mastoids and visualized paranasal sinuses: Mild mucosal thickening present about the inferior aspect of the right maxillary sinus. Paranasal sinuses are otherwise clear. Mastoid air cells and  middle ear cavities are well pneumatized and free of fluid. Skeleton: No discrete or worrisome osseous lesions. Upper chest: Not included on this exam. Other: None. IMPRESSION: Negative CT of the neck. No acute inflammatory changes or other abnormality identified. Electronically Signed   By: Rise Mu M.D.   On: 03/26/2023 20:35   CT SINUS WO CONTRAST Result Date: 03/26/2023 CLINICAL DATA:  Sinusitis EXAM: CT MAXILLOFACIAL WITHOUT CONTRAST TECHNIQUE: Multidetector CT imaging of the maxillofacial structures was performed. Multiplanar CT image reconstructions were also generated. RADIATION DOSE REDUCTION: This exam was performed according to the departmental dose-optimization program which includes automated exposure control, adjustment of the mA and/or kV according to patient size and/or use of iterative reconstruction technique. COMPARISON:  None Available. FINDINGS: Osseous: Mastoid air cells are clear. Mild anterior subluxation of the bilateral mandibular heads with respect to the mandibular fossa. No mandibular fracture. Pterygoid plates and zygomatic arches appear intact. Chronic appearing right nasal bone deformity Orbits: Remote appearing fracture deformity medial wall right orbit. No acute osseous abnormality. Globes are unremarkable Sinuses: No acute fluid levels. Frontal sinuses are clear. Minimal mucosal thickening in the anterior ethmoid air cells. Small mucous retention cysts in the right maxillary sinus. Sphenoid sinuses are clear. No sinus wall fracture. Ostiomeatal complexes  appear patent bilaterally. Moderate leftward deviation of the mid nasal septum. Soft tissues: No acute findings. Limited intracranial: Negative IMPRESSION: 1. Minimal mucosal thickening in the anterior ethmoid air cells and small mucous retention cysts in the right maxillary sinus. No acute fluid levels to suggest acute sinusitis. 2. Moderate leftward deviation of the mid nasal septum. 3. No acute facial bone  fracture. Suspect chronic right nasal bone fracture Electronically Signed   By: Jasmine Pang M.D.   On: 03/26/2023 17:48   DG Chest 2 View Result Date: 03/24/2023 CLINICAL DATA:  Shortness of breath. EXAM: CHEST - 2 VIEW COMPARISON:  Chest radiographs 03/17/2023 FINDINGS: Cardiac silhouette and mediastinal contours are within normal limits. The lungs are clear. No pleural effusion pneumothorax. Mild multilevel degenerative disc changes of the thoracic spine. IMPRESSION: No active cardiopulmonary disease. Electronically Signed   By: Neita Garnet M.D.   On: 03/24/2023 14:16   DG Chest 2 View Result Date: 03/17/2023 CLINICAL DATA:  Shortness of breath worsening over the last few days. EXAM: CHEST - 2 VIEW COMPARISON:  AP chest 03/07/2023 FINDINGS: Cardiac silhouette and mediastinal contours are within normal limits. The lungs are clear. No pleural effusion or pneumothorax. Mild multilevel degenerative disc changes of the mid to lower thoracic spine. IMPRESSION: No active cardiopulmonary disease. Electronically Signed   By: Neita Garnet M.D.   On: 03/17/2023 16:10   (Echo, Carotid, EGD, Colonoscopy, ERCP)    Subjective: Pt c/o fatigue   Discharge Exam: Vitals:   04/12/23 0723 04/12/23 0836  BP:  (!) 140/103  Pulse:  81  Resp:  19  Temp:  98.4 F (36.9 C)  SpO2: 98% 99%   Vitals:   04/12/23 0352 04/12/23 0500 04/12/23 0723 04/12/23 0836  BP: (!) 137/100   (!) 140/103  Pulse: 72   81  Resp: 20   19  Temp: 97.6 F (36.4 C)   98.4 F (36.9 C)  TempSrc: Axillary   Oral  SpO2: 100%  98% 99%  Weight:  (!) 155.1 kg    Height:        General: Pt is alert, awake, not in acute distress. Morbidly obese Cardiovascular:  S1/S2 +, no rubs, no gallops Respiratory: decreased breath sounds b/l  Abdominal: Soft, NT,obese, bowel sounds + Extremities: no cyanosis    The results of significant diagnostics from this hospitalization (including imaging, microbiology, ancillary and laboratory) are  listed below for reference.     Microbiology: Recent Results (from the past 240 hours)  Resp panel by RT-PCR (RSV, Flu A&B, Covid) Anterior Nasal Swab     Status: None   Collection Time: 04/09/23  4:51 PM   Specimen: Anterior Nasal Swab  Result Value Ref Range Status   SARS Coronavirus 2 by RT PCR NEGATIVE NEGATIVE Final    Comment: (NOTE) SARS-CoV-2 target nucleic acids are NOT DETECTED.  The SARS-CoV-2 RNA is generally detectable in upper respiratory specimens during the acute phase of infection. The lowest concentration of SARS-CoV-2 viral copies this assay can detect is 138 copies/mL. A negative result does not preclude SARS-Cov-2 infection and should not be used as the sole basis for treatment or other patient management decisions. A negative result may occur with  improper specimen collection/handling, submission of specimen other than nasopharyngeal swab, presence of viral mutation(s) within the areas targeted by this assay, and inadequate number of viral copies(<138 copies/mL). A negative result must be combined with clinical observations, patient history, and epidemiological information. The expected result is Negative.  Fact  Sheet for Patients:  BloggerCourse.com  Fact Sheet for Healthcare Providers:  SeriousBroker.it  This test is no t yet approved or cleared by the [place] FDA and  has been authorized for detection and/or diagnosis of SARS-CoV-2 by FDA under an Emergency Use Authorization (EUA). This EUA will remain  in effect (meaning this test can be used) for the duration of the COVID-19 declaration under Section 564(b)(1) of the Act, 21 U.S.C.section 360bbb-3(b)(1), unless the authorization is terminated  or revoked sooner.       Influenza A by PCR NEGATIVE NEGATIVE Final   Influenza B by PCR NEGATIVE NEGATIVE Final    Comment: (NOTE) The Xpert Xpress SARS-CoV-2/FLU/RSV plus assay is intended as an  aid in the diagnosis of influenza from Nasopharyngeal swab specimens and should not be used as a sole basis for treatment. Nasal washings and aspirates are unacceptable for Xpert Xpress SARS-CoV-2/FLU/RSV testing.  Fact Sheet for Patients: BloggerCourse.com  Fact Sheet for Healthcare Providers: SeriousBroker.it  This test is not yet approved or cleared by the [place] FDA and has been authorized for detection and/or diagnosis of SARS-CoV-2 by FDA under an Emergency Use Authorization (EUA). This EUA will remain in effect (meaning this test can be used) for the duration of the COVID-19 declaration under Section 564(b)(1) of the Act, 21 U.S.C. section 360bbb-3(b)(1), unless the authorization is terminated or revoked.     Resp Syncytial Virus by PCR NEGATIVE NEGATIVE Final    Comment: (NOTE) Fact Sheet for Patients: BloggerCourse.com  Fact Sheet for Healthcare Providers: SeriousBroker.it  This test is not yet approved or cleared by the [place] FDA and has been authorized for detection and/or diagnosis of SARS-CoV-2 by FDA under an Emergency Use Authorization (EUA). This EUA will remain in effect (meaning this test can be used) for the duration of the COVID-19 declaration under Section 564(b)(1) of the Act, 21 U.S.C. section 360bbb-3(b)(1), unless the authorization is terminated or revoked.  Performed at [facility], [address] [place], [place] [address]   Respiratory (~20 pathogens) panel by PCR     Status: None   Collection Time: 04/09/23  8:14 PM   Specimen: Nasopharyngeal Swab; Respiratory  Result Value Ref Range Status   Adenovirus NOT DETECTED NOT DETECTED Final   Coronavirus 229E NOT DETECTED NOT DETECTED Final    Comment: (NOTE) The Coronavirus on the Respiratory Panel, DOES NOT test for the novel  Coronavirus (2019 nCoV)    Coronavirus HKU1  NOT DETECTED NOT DETECTED Final   Coronavirus NL63 NOT DETECTED NOT DETECTED Final   Coronavirus OC43 NOT DETECTED NOT DETECTED Final   Metapneumovirus NOT DETECTED NOT DETECTED Final   Rhinovirus / Enterovirus NOT DETECTED NOT DETECTED Final   Influenza A NOT DETECTED NOT DETECTED Final   Influenza B NOT DETECTED NOT DETECTED Final   Parainfluenza Virus 1 NOT DETECTED NOT DETECTED Final   Parainfluenza Virus 2 NOT DETECTED NOT DETECTED Final   Parainfluenza Virus 3 NOT DETECTED NOT DETECTED Final   Parainfluenza Virus 4 NOT DETECTED NOT DETECTED Final   Respiratory Syncytial Virus NOT DETECTED NOT DETECTED Final   Bordetella pertussis NOT DETECTED NOT DETECTED Final   Bordetella Parapertussis NOT DETECTED NOT DETECTED Final   Chlamydophila pneumoniae NOT DETECTED NOT DETECTED Final   Mycoplasma pneumoniae NOT DETECTED NOT DETECTED Final    Comment: Performed at [facility], [address] [place], [place] [address]     Labs: BNP (last 3 results) Recent Labs  03/24/23 1208 04/01/23 1610 04/09/23 1552  BNP 10.6 11.4 4.5   Basic Metabolic Panel: Recent Labs  Lab 04/09/23 1552 04/10/23 0422 04/11/23 0704 04/12/23 0536  NA 136 137 138 137  K 3.8 4.7 4.4 4.3  CL 100 103 104 101  CO2 26 26 26 28   GLUCOSE 100* 151* 120* 110*  BUN 21* 18 22* 24*  CREATININE 0.77 0.86 0.95 0.92  CALCIUM 8.9 8.8* 9.1 8.9   Liver Function Tests: No results for input(s): "AST", "ALT", "ALKPHOS", "BILITOT", "PROT", "ALBUMIN" in the last 168 hours. No results for input(s): "LIPASE", "AMYLASE" in the last 168 hours. No results for input(s): "AMMONIA" in the last 168 hours. CBC: Recent Labs  Lab 04/09/23 1552 04/10/23 0422 04/11/23 0704 04/12/23 0536  WBC 7.3 8.6 13.9* 15.3*  NEUTROABS 4.2  --   --   --   HGB 12.7* 12.6* 12.1* 12.6*  HCT 38.9* 39.2 37.5* 39.9  MCV 84.0 87.1 86.6 87.1  PLT 421* 376 361 385   Cardiac Enzymes: No results for input(s): "CKTOTAL", "CKMB",  "CKMBINDEX", "TROPONINI" in the last 168 hours. BNP: Invalid input(s): "POCBNP" CBG: Recent Labs  Lab 04/05/23 1201 04/05/23 1651 04/05/23 2101 04/06/23 0801 04/06/23 1127  GLUCAP 100* 114* 97 85 123*   D-Dimer No results for input(s): "DDIMER" in the last 72 hours. Hgb A1c No results for input(s): "HGBA1C" in the last 72 hours. Lipid Profile No results for input(s): "CHOL", "HDL", "LDLCALC", "TRIG", "CHOLHDL", "LDLDIRECT" in the last 72 hours. Thyroid function studies No results for input(s): "TSH", "T4TOTAL", "T3FREE", "THYROIDAB" in the last 72 hours.  Invalid input(s): "FREET3" Anemia work up No results for input(s): "VITAMINB12", "FOLATE", "FERRITIN", "TIBC", "IRON", "RETICCTPCT" in the last 72 hours. Urinalysis No results found for: "COLORURINE", "APPEARANCEUR", "LABSPEC", "PHURINE", "GLUCOSEU", "HGBUR", "BILIRUBINUR", "KETONESUR", "PROTEINUR", "UROBILINOGEN", "NITRITE", "LEUKOCYTESUR" Sepsis Labs Recent Labs  Lab 04/09/23 1552 04/10/23 0422 04/11/23 0704 04/12/23 0536  WBC 7.3 8.6 13.9* 15.3*   Microbiology Recent Results (from the past 240 hours)  Resp panel by RT-PCR (RSV, Flu A&B, Covid) Anterior Nasal Swab     Status: None   Collection Time: 04/09/23  4:51 PM   Specimen: Anterior Nasal Swab  Result Value Ref Range Status   SARS Coronavirus 2 by RT PCR NEGATIVE NEGATIVE Final    Comment: (NOTE) SARS-CoV-2 target nucleic acids are NOT DETECTED.  The SARS-CoV-2 RNA is generally detectable in upper respiratory specimens during the acute phase of infection. The lowest concentration of SARS-CoV-2 viral copies this assay can detect is 138 copies/mL. A negative result does not preclude SARS-Cov-2 infection and should not be used as the sole basis for treatment or other patient management decisions. A negative result may occur with  improper specimen collection/handling, submission of specimen other than nasopharyngeal swab, presence of viral mutation(s) within  the areas targeted by this assay, and inadequate number of viral copies(<138 copies/mL). A negative result must be combined with clinical observations, patient history, and epidemiological information. The expected result is Negative.  Fact Sheet for Patients:  BloggerCourse.com  Fact Sheet for Healthcare Providers:  SeriousBroker.it  This test is no t yet approved or cleared by the [place] FDA and  has been authorized for detection and/or diagnosis of SARS-CoV-2 by FDA under an Emergency Use Authorization (EUA). This EUA will remain  in effect (meaning this test can be used) for the duration of the COVID-19 declaration under Section 564(b)(1) of the Act, 21 U.S.C.section 360bbb-3(b)(1), unless the authorization is terminated  or revoked  sooner.       Influenza A by PCR NEGATIVE NEGATIVE Final   Influenza B by PCR NEGATIVE NEGATIVE Final    Comment: (NOTE) The Xpert Xpress SARS-CoV-2/FLU/RSV plus assay is intended as an aid in the diagnosis of influenza from Nasopharyngeal swab specimens and should not be used as a sole basis for treatment. Nasal washings and aspirates are unacceptable for Xpert Xpress SARS-CoV-2/FLU/RSV testing.  Fact Sheet for Patients: BloggerCourse.com  Fact Sheet for Healthcare Providers: SeriousBroker.it  This test is not yet approved or cleared by the [place] FDA and has been authorized for detection and/or diagnosis of SARS-CoV-2 by FDA under an Emergency Use Authorization (EUA). This EUA will remain in effect (meaning this test can be used) for the duration of the COVID-19 declaration under Section 564(b)(1) of the Act, 21 U.S.C. section 360bbb-3(b)(1), unless the authorization is terminated or revoked.     Resp Syncytial Virus by PCR NEGATIVE NEGATIVE Final    Comment: (NOTE) Fact Sheet for  Patients: BloggerCourse.com  Fact Sheet for Healthcare Providers: SeriousBroker.it  This test is not yet approved or cleared by the [place] FDA and has been authorized for detection and/or diagnosis of SARS-CoV-2 by FDA under an Emergency Use Authorization (EUA). This EUA will remain in effect (meaning this test can be used) for the duration of the COVID-19 declaration under Section 564(b)(1) of the Act, 21 U.S.C. section 360bbb-3(b)(1), unless the authorization is terminated or revoked.  Performed at [facility], [address] [place], [place] [address]   Respiratory (~20 pathogens) panel by PCR     Status: None   Collection Time: 04/09/23  8:14 PM   Specimen: Nasopharyngeal Swab; Respiratory  Result Value Ref Range Status   Adenovirus NOT DETECTED NOT DETECTED Final   Coronavirus 229E NOT DETECTED NOT DETECTED Final    Comment: (NOTE) The Coronavirus on the Respiratory Panel, DOES NOT test for the novel  Coronavirus (2019 nCoV)    Coronavirus HKU1 NOT DETECTED NOT DETECTED Final   Coronavirus NL63 NOT DETECTED NOT DETECTED Final   Coronavirus OC43 NOT DETECTED NOT DETECTED Final   Metapneumovirus NOT DETECTED NOT DETECTED Final   Rhinovirus / Enterovirus NOT DETECTED NOT DETECTED Final   Influenza A NOT DETECTED NOT DETECTED Final   Influenza B NOT DETECTED NOT DETECTED Final   Parainfluenza Virus 1 NOT DETECTED NOT DETECTED Final   Parainfluenza Virus 2 NOT DETECTED NOT DETECTED Final   Parainfluenza Virus 3 NOT DETECTED NOT DETECTED Final   Parainfluenza Virus 4 NOT DETECTED NOT DETECTED Final   Respiratory Syncytial Virus NOT DETECTED NOT DETECTED Final   Bordetella pertussis NOT DETECTED NOT DETECTED Final   Bordetella Parapertussis NOT DETECTED NOT DETECTED Final   Chlamydophila pneumoniae NOT DETECTED NOT DETECTED Final   Mycoplasma pneumoniae NOT DETECTED NOT DETECTED Final    Comment: Performed at  [facility], [address] [place], [place] [address]     Time coordinating discharge: Over 30 minutes  SIGNED:   Charise Killian, MD  [facility] 04/12/2023, 11:31 AM Pager   If 7PM-7AM, please contact night-coverage [email or web address]

## 2023-04-12 NOTE — Progress Notes (Signed)
 Patient given discharge information, all questions answered. IV and tele removed. Patient stated he was going to hang out until about 1215 before leaving, wife to provide transportation and in room with patient at this time.

## 2023-04-19 ENCOUNTER — Other Ambulatory Visit: Payer: Self-pay

## 2023-04-19 ENCOUNTER — Emergency Department: Payer: MEDICAID

## 2023-04-19 ENCOUNTER — Inpatient Hospital Stay
Admission: EM | Admit: 2023-04-19 | Discharge: 2023-04-22 | DRG: 202 | Disposition: A | Discharge status: Home / Self Care | Payer: MEDICAID | Attending: Internal Medicine | Admitting: Internal Medicine

## 2023-04-19 DIAGNOSIS — J441 Chronic obstructive pulmonary disease with (acute) exacerbation: Secondary | ICD-10-CM | POA: Diagnosis present

## 2023-04-19 DIAGNOSIS — J45901 Unspecified asthma with (acute) exacerbation: Secondary | ICD-10-CM | POA: Diagnosis present

## 2023-04-19 DIAGNOSIS — J4551 Severe persistent asthma with (acute) exacerbation: Principal | ICD-10-CM | POA: Diagnosis present

## 2023-04-19 DIAGNOSIS — J4541 Moderate persistent asthma with (acute) exacerbation: Secondary | ICD-10-CM | POA: Diagnosis not present

## 2023-04-19 DIAGNOSIS — Z6841 Body Mass Index (BMI) 40.0 and over, adult: Secondary | ICD-10-CM

## 2023-04-19 DIAGNOSIS — G4733 Obstructive sleep apnea (adult) (pediatric): Secondary | ICD-10-CM | POA: Diagnosis present

## 2023-04-19 DIAGNOSIS — Z716 Tobacco abuse counseling: Secondary | ICD-10-CM

## 2023-04-19 DIAGNOSIS — R0789 Other chest pain: Secondary | ICD-10-CM

## 2023-04-19 DIAGNOSIS — F17210 Nicotine dependence, cigarettes, uncomplicated: Secondary | ICD-10-CM | POA: Diagnosis present

## 2023-04-19 DIAGNOSIS — Z720 Tobacco use: Secondary | ICD-10-CM | POA: Diagnosis present

## 2023-04-19 DIAGNOSIS — R0602 Shortness of breath: Secondary | ICD-10-CM | POA: Diagnosis not present

## 2023-04-19 DIAGNOSIS — G8929 Other chronic pain: Secondary | ICD-10-CM | POA: Diagnosis present

## 2023-04-19 DIAGNOSIS — J45909 Unspecified asthma, uncomplicated: Secondary | ICD-10-CM | POA: Diagnosis present

## 2023-04-19 DIAGNOSIS — E66813 Obesity, class 3: Secondary | ICD-10-CM | POA: Diagnosis present

## 2023-04-19 DIAGNOSIS — Z8249 Family history of ischemic heart disease and other diseases of the circulatory system: Secondary | ICD-10-CM

## 2023-04-19 DIAGNOSIS — Z79899 Other long term (current) drug therapy: Secondary | ICD-10-CM

## 2023-04-19 DIAGNOSIS — E65 Localized adiposity: Secondary | ICD-10-CM | POA: Diagnosis present

## 2023-04-19 DIAGNOSIS — K219 Gastro-esophageal reflux disease without esophagitis: Secondary | ICD-10-CM | POA: Diagnosis present

## 2023-04-19 DIAGNOSIS — I10 Essential (primary) hypertension: Secondary | ICD-10-CM | POA: Diagnosis present

## 2023-04-19 LAB — CBC
HCT: 43.2 % (ref 39.0–52.0)
Hemoglobin: 13.8 g/dL (ref 13.0–17.0)
MCH: 27.9 pg (ref 26.0–34.0)
MCHC: 31.9 g/dL (ref 30.0–36.0)
MCV: 87.4 fL (ref 80.0–100.0)
Platelets: 400 10*3/uL (ref 150–400)
RBC: 4.94 MIL/uL (ref 4.22–5.81)
RDW: 14.6 % (ref 11.5–15.5)
WBC: 7.2 10*3/uL (ref 4.0–10.5)
nRBC: 0 % (ref 0.0–0.2)

## 2023-04-19 LAB — MAGNESIUM: Magnesium: 2.5 mg/dL — ABNORMAL HIGH (ref 1.7–2.4)

## 2023-04-19 LAB — BASIC METABOLIC PANEL WITH GFR
Anion gap: 10 (ref 5–15)
BUN: 18 mg/dL (ref 6–20)
CO2: 25 mmol/L (ref 22–32)
Calcium: 9.2 mg/dL (ref 8.9–10.3)
Chloride: 105 mmol/L (ref 98–111)
Creatinine, Ser: 0.87 mg/dL (ref 0.61–1.24)
GFR, Estimated: >60 mL/min (ref >60)
Glucose, Bld: 130 mg/dL — ABNORMAL HIGH (ref 70–99)
Potassium: 4 mmol/L (ref 3.5–5.1)
Sodium: 140 mmol/L (ref 135–145)

## 2023-04-19 LAB — TROPONIN I (HIGH SENSITIVITY): Troponin I (High Sensitivity): 5 ng/L (ref <18)

## 2023-04-19 MED ORDER — GUAIFENESIN-DM 100-10 MG/5ML PO SYRP: 10.0000 mL | ORAL_SOLUTION | Freq: Four times a day (QID) | ORAL | Status: DC | PRN

## 2023-04-19 MED ORDER — IPRATROPIUM-ALBUTEROL 0.5-2.5 (3) MG/3ML IN SOLN
3.0000 mL | Freq: Four times a day (QID) | RESPIRATORY_TRACT | Status: AC
  Administered 2023-04-19 – 2023-04-20 (×3): 3 mL via RESPIRATORY_TRACT
  Filled 2023-04-19 (×3): qty 3

## 2023-04-19 MED ORDER — ACETAMINOPHEN 325 MG PO TABS: 650.0000 mg | ORAL_TABLET | Freq: Four times a day (QID) | ORAL | Status: DC | PRN

## 2023-04-19 MED ORDER — FLUTICASONE FUROATE-VILANTEROL 200-25 MCG/ACT IN AEPB
1.0000 | INHALATION_SPRAY | Freq: Every day | RESPIRATORY_TRACT | Status: DC
  Filled 2023-04-19: qty 28

## 2023-04-19 MED ORDER — HYDRALAZINE HCL 20 MG/ML IJ SOLN: 5.0000 mg | Freq: Four times a day (QID) | INTRAMUSCULAR | Status: DC | PRN

## 2023-04-19 MED ORDER — METHYLPREDNISOLONE SODIUM SUCC 40 MG IJ SOLR
40.0000 mg | Freq: Every day | INTRAMUSCULAR | Status: DC
  Filled 2023-04-19: qty 1

## 2023-04-19 MED ORDER — OXYCODONE HCL 5 MG PO TABS
5.0000 mg | ORAL_TABLET | Freq: Four times a day (QID) | ORAL | Status: DC | PRN
  Administered 2023-04-19 – 2023-04-22 (×8): 5 mg via ORAL
  Filled 2023-04-19 (×8): qty 1

## 2023-04-19 MED ORDER — ACETAMINOPHEN 650 MG RE SUPP: 650.0000 mg | Freq: Four times a day (QID) | RECTAL | Status: DC | PRN

## 2023-04-19 MED ORDER — AMLODIPINE BESYLATE 5 MG PO TABS
5.0000 mg | ORAL_TABLET | Freq: Every day | ORAL | Status: DC
  Administered 2023-04-19 – 2023-04-22 (×4): 5 mg via ORAL
  Filled 2023-04-19 (×4): qty 1

## 2023-04-19 MED ORDER — PANTOPRAZOLE SODIUM 40 MG PO TBEC
40.0000 mg | DELAYED_RELEASE_TABLET | Freq: Every day | ORAL | Status: DC
  Administered 2023-04-19 – 2023-04-22 (×4): 40 mg via ORAL
  Filled 2023-04-19 (×4): qty 1

## 2023-04-19 MED ORDER — METHYLPREDNISOLONE SODIUM SUCC 125 MG IJ SOLR
125.0000 mg | Freq: Once | INTRAMUSCULAR | Status: AC
  Administered 2023-04-19: 125 mg via INTRAVENOUS
  Filled 2023-04-19: qty 2

## 2023-04-19 MED ORDER — ENOXAPARIN SODIUM 40 MG/0.4ML IJ SOSY
40.0000 mg | PREFILLED_SYRINGE | INTRAMUSCULAR | Status: DC
  Administered 2023-04-19: 40 mg via SUBCUTANEOUS
  Filled 2023-04-19: qty 0.4

## 2023-04-19 MED ORDER — METOPROLOL SUCCINATE ER 50 MG PO TB24
50.0000 mg | ORAL_TABLET | Freq: Every day | ORAL | Status: DC
  Administered 2023-04-19 – 2023-04-22 (×4): 50 mg via ORAL
  Filled 2023-04-19 (×4): qty 1

## 2023-04-19 MED ORDER — IPRATROPIUM-ALBUTEROL 0.5-2.5 (3) MG/3ML IN SOLN
9.0000 mL | Freq: Once | RESPIRATORY_TRACT | Status: AC
  Administered 2023-04-19: 9 mL via RESPIRATORY_TRACT
  Filled 2023-04-19: qty 9

## 2023-04-19 MED ORDER — LORATADINE 10 MG PO TABS
10.0000 mg | ORAL_TABLET | Freq: Every day | ORAL | Status: DC
  Administered 2023-04-19 – 2023-04-22 (×4): 10 mg via ORAL
  Filled 2023-04-19 (×4): qty 1

## 2023-04-19 MED ORDER — ONDANSETRON HCL 4 MG PO TABS
4.0000 mg | ORAL_TABLET | Freq: Four times a day (QID) | ORAL | Status: DC | PRN
Start: 2023-04-19 — End: 2023-04-22

## 2023-04-19 MED ORDER — ONDANSETRON HCL 4 MG/2ML IJ SOLN: 4.0000 mg | Freq: Four times a day (QID) | INTRAMUSCULAR | Status: DC | PRN

## 2023-04-19 MED ORDER — MONTELUKAST SODIUM 10 MG PO TABS
10.0000 mg | ORAL_TABLET | Freq: Every day | ORAL | Status: DC
  Administered 2023-04-19 – 2023-04-22 (×4): 10 mg via ORAL
  Filled 2023-04-19 (×4): qty 1

## 2023-04-19 MED ORDER — SENNOSIDES-DOCUSATE SODIUM 8.6-50 MG PO TABS: 1.0000 | ORAL_TABLET | Freq: Every evening | ORAL | Status: DC | PRN

## 2023-04-19 MED ORDER — NICOTINE 21 MG/24HR TD PT24: 21.0000 mg | MEDICATED_PATCH | Freq: Every day | TRANSDERMAL | Status: DC | PRN

## 2023-04-19 MED ORDER — IPRATROPIUM-ALBUTEROL 0.5-2.5 (3) MG/3ML IN SOLN
6.0000 mL | Freq: Once | RESPIRATORY_TRACT | Status: AC
  Administered 2023-04-19: 6 mL via RESPIRATORY_TRACT
  Filled 2023-04-19: qty 6

## 2023-04-19 NOTE — Assessment & Plan Note (Deleted)
 DuoNebs 4 times daily, 3 doses ordered on admission Status post Solu-Medrol 125 mg IV one-time dose per EP Continue Solu-Medrol 40 mg IV one-time dose for 04/20/2023 Check serum magnesium on admission

## 2023-04-19 NOTE — Assessment & Plan Note (Addendum)
 DuoNebs 4 times daily, 3 doses ordered on admission Status post Solu-Medrol 125 mg IV one-time dose per EP Continue Solu-Medrol 40 mg IV one-time dose for 04/20/2023 Check serum magnesium on admission Continuous pulse oximetry BiPAP discontinued as patient is tolerating nasal cannula with ambulation to the restroom

## 2023-04-19 NOTE — Assessment & Plan Note (Addendum)
 Home amlodipine 5 mg daily, metoprolol succinate 50 mg daily were resumed Hydralazine 5 mg IV every 6 hours as needed for SBP >165, 5 days ordered

## 2023-04-19 NOTE — Progress Notes (Signed)
 Patient refused skin assessment witnessed by Ricard Chalk, RN.

## 2023-04-19 NOTE — Assessment & Plan Note (Addendum)
 Patient reports he has home inhalers and nebulizer that he has used at home prior to coming to the ED Home long-acting maintenance inhaler resumed DuoNebs 4 times daily, 3 doses ordered on admission

## 2023-04-19 NOTE — Assessment & Plan Note (Signed)
 CPAP nightly ordered

## 2023-04-19 NOTE — ED Provider Notes (Signed)
 [facility] Provider Note    Event Date/Time   First MD Initiated Contact with Patient 04/19/23 1529     (approximate)   History   Shortness of Breath and Back Pain   HPI  Justin Reid is a 43 y.o. male who presents to the ED for evaluation of Shortness of Breath and Back Pain   Review medical DC summary from 1 week ago.  History of asthma and tobacco abuse, HTN, OSA on CPAP, morbid obesity with BMI of about 50.  Frequent admissions for asthma exacerbations.  Patient presents alongside a male friend for evaluation of shortness of breath since this morning.  Reports compliance with medications.  Still on prednisone from recent admission but reports not yet taking it today.  Still smoking "occasionally."  Reports chronic lower back pain without acute changes or trauma.  No chest pain, syncope, fevers or increased sputum production   Physical Exam   Triage Vital Signs: ED Triage Vitals [04/19/23 1514]  Encounter Vitals Group     BP (!) 130/113     Systolic BP Percentile      Diastolic BP Percentile      Pulse Rate (!) 105     Resp (!) 22     Temp 99 F (37.2 C)     Temp Source Oral     SpO2 98 %     Weight (!) 341 lb 14.9 oz (155.1 kg)     Height 5\' 10"  (1.778 m)     Head Circumference      Peak Flow      Pain Score 8     Pain Loc      Pain Education      Exclude from Growth Chart     Most recent vital signs: Vitals:   04/19/23 1515 04/19/23 1700  BP:  129/85  Pulse:  88  Resp: (!) 26 (!) 30  Temp:    SpO2:  98%    General: Awake, no distress.  CV:  Good peripheral perfusion.  Resp:  Quite tachypneic to 35-40.  Very poor airflow, faint wheezing throughout.  No focal features Abd:  No distention.  MSK:  No deformity noted.  Neuro:  No focal deficits appreciated. Other:     ED Results / Procedures / Treatments   Labs (all labs ordered are listed, but only abnormal results are displayed) Labs Reviewed  BASIC METABOLIC  PANEL WITH GFR - Abnormal; Notable for the following components:      Result Value   Glucose, Bld 130 (*)    All other components within normal limits  CBC  TROPONIN I (HIGH SENSITIVITY)  TROPONIN I (HIGH SENSITIVITY)    EKG Sinus rhythm with a rate of 100 bpm.  Normal axis and intervals.  No for signs of acute ischemia.  RADIOLOGY CXR interpreted by me without evidence of acute cardiopulmonary pathology.   Official radiology report(s): No results found.  PROCEDURES and INTERVENTIONS:  .1-3 Lead EKG Interpretation  Performed by: Delton Prairie, MD Authorized by: Delton Prairie, MD     Interpretation: abnormal     ECG rate:  104   ECG rate assessment: tachycardic     Rhythm: sinus tachycardia     Ectopy: none     Conduction: normal   .Critical Care  Performed by: Delton Prairie, MD Authorized by: Delton Prairie, MD   Critical care provider statement:    Critical care time (minutes):  30   Critical care time was  exclusive of:  Separately billable procedures and treating other patients   Critical care was necessary to treat or prevent imminent or life-threatening deterioration of the following conditions:  Respiratory failure   Critical care was time spent personally by me on the following activities:  Development of treatment plan with patient or surrogate, discussions with consultants, evaluation of patient's response to treatment, examination of patient, ordering and review of laboratory studies, ordering and review of radiographic studies, ordering and performing treatments and interventions, pulse oximetry, re-evaluation of patient's condition and review of old charts   Medications  ipratropium-albuterol (DUONEB) 0.5-2.5 (3) MG/3ML nebulizer solution 9 mL (9 mLs Nebulization Given 04/19/23 1545)  methylPREDNISolone sodium succinate (SOLU-MEDROL) 125 mg/2 mL injection 125 mg (125 mg Intravenous Given 04/19/23 1547)  ipratropium-albuterol (DUONEB) 0.5-2.5 (3) MG/3ML nebulizer  solution 6 mL (6 mLs Nebulization Given 04/19/23 1700)     IMPRESSION / MDM / ASSESSMENT AND PLAN / ED COURSE  I reviewed the triage vital signs and the nursing notes.  Differential diagnosis includes, but is not limited to, ACS, PTX, PNA, muscle strain/spasm, PE, dissection, anxiety, pleural effusion  {Patient presents with symptoms of an acute illness or injury that is potentially life-threatening.  Patient presents with evidence of another asthma exacerbation.  He is tachycardic, tachypneic and moving very little air.  No hypoxia though.  We will provide dose of steroids for today, breathing treatments and send basic labs, troponin and reassess.  Clinical Course as of 04/19/23 1751  Tue Apr 19, 2023  1652 Reassessed.  Still tachypneic and with poor airflow, is requesting more breathing treatments. [DS]  1718 Reassessed, respiratory rate is increased, about 40 no, still with audible wheezing, poor airflow.  We will start him on BiPAP.  Will consult with medicine for admission. [DS]  1750 Consult hospitalist agrees to admit [DS]    Clinical Course User Index [DS] Arline Bennett, MD     FINAL CLINICAL IMPRESSION(S) / ED DIAGNOSES   Final diagnoses:  Moderate persistent asthma with exacerbation     Rx / DC Orders   ED Discharge Orders     None        Note:  This document was prepared using Dragon voice recognition software and may include unintentional dictation errors.   Arline Bennett, MD 04/19/23 [phone]

## 2023-04-19 NOTE — H&P (Signed)
 History and Physical   Justin Reid RUE:[id number] DOB: 06-Aug-1980 DOA: 04/19/2023  PCP: Pcp, No Patient coming from: Home  I have personally briefly reviewed patient's old medical records in [facility] EMR.  Chief Concern: Shortness of breath  HPI: [name] is a 43 year old male with history of asthma, hypertension, morbid obesity, continued tobacco use, frequent hospitalization for asthma exacerbation, who presents to the emergency department for chief concerns of shortness of breath.  Vitals in the ED showed temperature of 99, respiration rate 26, heart rate of 105, blood pressure 130/113, SpO2 98% on 30% FiO2.  Serum sodium is 140, potassium 4.0, chloride 105, bicarb 25, BUN 18, serum creatinine 0.87, EGFR greater than 60, nonfasting blood glucose 130, WBC 7.2, hemoglobin 13.8, platelets of 400.  ED treatment: DuoNebs x 2, Solu-Medrol 125 mg IV one-time dose. ------------------------------------- At bedside, patient able to tell me his first and last name, age, location, current calendar year.  He reports that he started having shortness of breath this morning.  He denies trauma to his person.  He reports compliance with his home inhalers.  He denies chest pain, dysuria, hematuria, diarrhea, blood in his stool, swelling in his legs, syncope, loss of consciousness.  He denies fever at home.  He denies cough, sputum production.  He reports he has been using his home inhaler as appropriate.  He states he has nebulizers that he has use, however on med reconciliation I do not see nebulizers. He reports he does not have a lung doctor.  Social history: He lives at home with his girlfriend.  He is a former tobacco user, quitting about 1 week ago.  He has not had a single cigarette in 1 week.  He denies EtOH and recreational drug use.  ROS: Constitutional: no weight change, no fever ENT/Mouth: no sore throat, no rhinorrhea Eyes: no eye pain, no vision changes Cardiovascular:  no chest pain, + dyspnea,  no edema, no palpitations Respiratory: + cough, no sputum, + wheezing Gastrointestinal: no nausea, no vomiting, no diarrhea, no constipation Genitourinary: no urinary incontinence, no dysuria, no hematuria Musculoskeletal: no arthralgias, no myalgias Skin: no skin lesions, no pruritus, Neuro: no weakness, no loss of consciousness, no syncope Psych: no anxiety, no depression, no decrease appetite Heme/Lymph: no bruising, no bleeding  ED Course: Discussed with EDP, patient requiring hospitalization for chief concerns of asthma exacerbation.  Assessment/Plan  Principal Problem:   Acute severe exacerbation of severe persistent asthma Active Problems:   Asthma   OSA (obstructive sleep apnea)   Obesity, Class III, BMI 40-49.9 (morbid obesity) (HCC)   HTN (hypertension)   GERD (gastroesophageal reflux disease)   Truncal obesity   Tobacco use   Assessment and Plan:  * Acute severe exacerbation of severe persistent asthma DuoNebs 4 times daily, 3 doses ordered on admission Status post Solu-Medrol 125 mg IV one-time dose per EP Continue Solu-Medrol 40 mg IV one-time dose for 04/20/2023 Check serum magnesium on admission Continuous pulse oximetry BiPAP discontinued as patient is tolerating nasal cannula with ambulation to the restroom  Obesity, Class III, BMI 40-49.9 (morbid obesity) (HCC) This complicates overall care and prognosis.  Extensive discussion at bedside regarding healthy and gradual weight loss Patient endorses understanding  OSA (obstructive sleep apnea) CPAP nightly ordered  Asthma Patient reports he has home inhalers and nebulizer that he has used at home prior to coming to the ED Home long-acting maintenance inhaler resumed DuoNebs 4 times daily, 3 doses ordered on admission  HTN (hypertension)  Home amlodipine 5 mg daily, metoprolol succinate 50 mg daily were resumed Hydralazine 5 mg IV every 6 hours as needed for SBP >165, 5 days  ordered  Tobacco use As needed nicotine patch ordered  Chart reviewed.   Hospitalization from 04/09/23 to 04/12/23: For acute severe exacerbation of persistent asthma.  DVT prophylaxis: Enoxaparin 40 mg subcutaneous every 24 hours Code Status: Full code Diet: Heart healthy Family Communication: Updated girlfriend at bedside with patient's permission Disposition Plan: Pending clinical course Consults called: None at this time Admission status: Telemetry medical, observation  Past Medical History:  Diagnosis Date   2019-nCoV vaccination declined 11/10/2020   Arthritis    Asthma    Hypertension    Morbid obesity (HCC)    Shingles    Tobacco use    History reviewed. No pertinent surgical history.  Social History:  reports that he has been smoking cigarettes. He has a 2.5 pack-year smoking history. He has never used smokeless tobacco. He reports current alcohol use. He reports that he does not use drugs.  No Known Allergies Family History  Problem Relation Age of Onset   Hypertension Father    Family history: Family history reviewed and not pertinent.  Prior to Admission medications   Medication Sig Start Date End Date Taking? Authorizing Provider  acetaminophen (TYLENOL) 325 MG tablet Take 2 tablets (650 mg total) by mouth every 6 (six) hours as needed for mild pain (pain score 1-3) or fever. 04/06/23   Alexander, Natalie, DO  albuterol (VENTOLIN HFA) 108 (90 Base) MCG/ACT inhaler Inhale 2 puffs into the lungs every 4 (four) hours as needed for wheezing or shortness of breath. 04/06/23   Alexander, Natalie, DO  amLODipine (NORVASC) 5 MG tablet Take 1 tablet (5 mg total) by mouth daily. 04/06/23 07/05/23  Alexander, Natalie, DO  Dextromethorphan-guaiFENesin 20-200 MG/20ML LIQD Take 10 mLs by mouth every 6 (six) hours as needed. 04/06/23   Alexander, Natalie, DO  fluticasone-salmeterol [facility] INHUB) 250-50 MCG/ACT AEPB Inhale 1 puff into the lungs in the morning and at bedtime. 04/06/23    Alexander, Natalie, DO  loratadine (CLARITIN) 10 MG tablet Take 1 tablet (10 mg total) by mouth daily. 04/06/23   Alexander, Natalie, DO  metoprolol succinate (TOPROL-XL) 50 MG 24 hr tablet Take 1 tablet (50 mg total) by mouth daily. Take with or immediately following a meal. 04/06/23 04/05/24  Alexander, Natalie, DO  montelukast (SINGULAIR) 10 MG tablet Take 1 tablet (10 mg total) by mouth daily. 04/06/23   Alexander, Natalie, DO  nicotine (NICODERM CQ - DOSED IN MG/24 HOURS) 14 mg/24hr patch Place 1 patch (14 mg total) onto the skin daily as needed (nicotine craving). 04/06/23   Alexander, Natalie, DO  oxyCODONE (OXY IR/ROXICODONE) 5 MG immediate release tablet Take 1 tablet (5 mg total) by mouth every 6 (six) hours as needed for moderate pain (pain score 4-6) or severe pain (pain score 7-10). 04/06/23   Alexander, Natalie, DO  pantoprazole (PROTONIX) 40 MG tablet Take 1 tablet (40 mg total) by mouth daily. 04/06/23   Melodi Sprung, DO   Physical Exam: Vitals:   04/19/23 1830 04/19/23 1915 04/19/23 1927 04/19/23 1929  BP:   (!) 131/92   Pulse: 83 87 94   Resp: 15 20 (!) 22   Temp:    98.1 F (36.7 C)  TempSrc:    Oral  SpO2: 100% 100% 97%   Weight:      Height:       Constitutional: appears age-appropriate, BiPAP  mask in place Eyes: PERRL, lids and conjunctivae normal ENMT: Mucous membranes are moist. Posterior pharynx clear of any exudate or lesions. Age-appropriate dentition. Hearing appropriate Neck: normal, supple, no masses, no thyromegaly Respiratory: Generalized decreased lung sounds bilaterally, with end expiratory wheezing, no crackles.  Increased respiratory effort. No accessory muscle use.  Cardiovascular: Regular rate and rhythm, no murmurs / rubs / gallops. No extremity edema. 2+ pedal pulses. No carotid bruits.  Abdomen: Morbidly obese abdomen, no tenderness, no masses palpated, no hepatosplenomegaly. Bowel sounds positive.  Musculoskeletal: no clubbing / cyanosis. No joint  deformity upper and lower extremities. Good ROM, no contractures, no atrophy. Normal muscle tone.  Skin: no rashes, lesions, ulcers. No induration Neurologic: Sensation intact. Strength 5/5 in all 4.  Psychiatric: Normal judgment and insight. Alert and oriented x 3. Normal mood.   EKG: independently reviewed, showing sinus tachycardia with rate of 100, QTc 397  Chest x-ray on Admission: I personally reviewed and I agree with radiologist reading as below.  DG Chest 2 View Result Date: 04/19/2023 CLINICAL DATA:  cp, sob EXAM: CHEST - 2 VIEW COMPARISON:  04/09/2023 FINDINGS: Lungs are clear.  No pneumothorax. Heart size and mediastinal contours are within normal limits. No effusion. Possible free osteochondral bodies in the left shoulder. IMPRESSION: No acute cardiopulmonary disease. Electronically Signed   By: Nicoletta Barrier M.D.   On: 04/19/2023 17:54   Labs on Admission: I have personally reviewed following labs  CBC: Recent Labs  Lab 04/19/23 1515  WBC 7.2  HGB 13.8  HCT 43.2  MCV 87.4  PLT 400   Basic Metabolic Panel: Recent Labs  Lab 04/19/23 1515  NA 140  K 4.0  CL 105  CO2 25  GLUCOSE 130*  BUN 18  CREATININE 0.87  CALCIUM 9.2  MG 2.5*   GFR: Estimated Creatinine Clearance: 167.2 mL/min (by C-G formula based on SCr of 0.87 mg/dL).  This document was prepared using Dragon Voice Recognition software and may include unintentional dictation errors.  Dr. Reinhold Carbine [facility]  If 7PM-7AM, please contact overnight-coverage provider If 7AM-7PM, please contact day attending provider [email or web address]  04/19/2023, 8:25 PM

## 2023-04-19 NOTE — Assessment & Plan Note (Signed)
 -  As needed nicotine patch ordered ?

## 2023-04-19 NOTE — ED Notes (Signed)
Pt taken off bipap. ?

## 2023-04-19 NOTE — ED Notes (Signed)
 Pt ambulatory from room 4 to bathroom independently with steady gait. Pt tolerated activity well.

## 2023-04-19 NOTE — Hospital Course (Signed)
 [name] is a 43 year old male with history of asthma, hypertension, morbid obesity, continued tobacco use, frequent hospitalization for asthma exacerbation, who presents to the emergency department for chief concerns of shortness of breath.  Vitals in the ED showed temperature of 99, respiration rate 26, heart rate of 105, blood pressure 130/113, SpO2 98% on 30% FiO2.  Serum sodium is 140, potassium 4.0, chloride 105, bicarb 25, BUN 18, serum creatinine 0.87, EGFR greater than 60, nonfasting blood glucose 130, WBC 7.2, hemoglobin 13.8, platelets of 400.  ED treatment: DuoNebs x 2, Solu-Medrol 125 mg IV one-time dose.

## 2023-04-19 NOTE — Assessment & Plan Note (Addendum)
 This complicates overall care and prognosis.  Extensive discussion at bedside regarding healthy and gradual weight loss Patient endorses understanding

## 2023-04-19 NOTE — ED Triage Notes (Signed)
 Pt here with SOB, CP, and back pain. Pt states he tried to use his inhaler with no relief. Pt states the cp is worse with cough. Pt states back pain is lower and does not radiate. Pt has audible wheezing.

## 2023-04-19 NOTE — Progress Notes (Signed)
 Patient transported to new room and placed back on BIPAP per previous settings.  Patient tolerating well at this time.

## 2023-04-19 NOTE — Progress Notes (Signed)
 RT called to patient room by RN to assess patient.  Patient found to be in respiratory distress with audible wheezing and respiratory rate in the 40's.  O2 sats 96%.  Patient getting duoneb.  Patient placed back on a BIPAP on previous settings from ER.  Patient tolerating well at this time.

## 2023-04-20 DIAGNOSIS — I10 Essential (primary) hypertension: Secondary | ICD-10-CM

## 2023-04-20 DIAGNOSIS — R0789 Other chest pain: Secondary | ICD-10-CM

## 2023-04-20 DIAGNOSIS — J4551 Severe persistent asthma with (acute) exacerbation: Secondary | ICD-10-CM

## 2023-04-20 LAB — CBC
HCT: 41.4 % (ref 39.0–52.0)
Hemoglobin: 13.2 g/dL (ref 13.0–17.0)
MCH: 27.7 pg (ref 26.0–34.0)
MCHC: 31.9 g/dL (ref 30.0–36.0)
MCV: 86.8 fL (ref 80.0–100.0)
Platelets: 412 10*3/uL — ABNORMAL HIGH (ref 150–400)
RBC: 4.77 MIL/uL (ref 4.22–5.81)
RDW: 14.6 % (ref 11.5–15.5)
WBC: 8.6 10*3/uL (ref 4.0–10.5)
nRBC: 0 % (ref 0.0–0.2)

## 2023-04-20 LAB — BASIC METABOLIC PANEL WITH GFR
Anion gap: 10 (ref 5–15)
BUN: 18 mg/dL (ref 6–20)
CO2: 23 mmol/L (ref 22–32)
Calcium: 9.1 mg/dL (ref 8.9–10.3)
Chloride: 102 mmol/L (ref 98–111)
Creatinine, Ser: 0.95 mg/dL (ref 0.61–1.24)
GFR, Estimated: >60 mL/min (ref >60)
Glucose, Bld: 175 mg/dL — ABNORMAL HIGH (ref 70–99)
Potassium: 4.8 mmol/L (ref 3.5–5.1)
Sodium: 135 mmol/L (ref 135–145)

## 2023-04-20 LAB — TROPONIN I (HIGH SENSITIVITY): Troponin I (High Sensitivity): 3 ng/L (ref <18)

## 2023-04-20 MED ORDER — ENOXAPARIN SODIUM 80 MG/0.8ML IJ SOSY
0.5000 mg/kg | PREFILLED_SYRINGE | INTRAMUSCULAR | Status: DC
Start: 2023-04-20 — End: 2023-04-22
  Administered 2023-04-20 – 2023-04-21 (×2): 75 mg via SUBCUTANEOUS
  Filled 2023-04-20 (×2): qty 0.8

## 2023-04-20 MED ORDER — DIPHENHYDRAMINE HCL 25 MG PO CAPS
25.0000 mg | ORAL_CAPSULE | Freq: Every evening | ORAL | Status: DC | PRN
  Administered 2023-04-20 – 2023-04-21 (×2): 25 mg via ORAL
  Filled 2023-04-20 (×2): qty 1

## 2023-04-20 MED ORDER — BUDESONIDE 0.25 MG/2ML IN SUSP
0.2500 mg | Freq: Two times a day (BID) | RESPIRATORY_TRACT | Status: DC
  Administered 2023-04-20 – 2023-04-22 (×5): 0.25 mg via RESPIRATORY_TRACT
  Filled 2023-04-20 (×5): qty 2

## 2023-04-20 MED ORDER — METHYLPREDNISOLONE SODIUM SUCC 40 MG IJ SOLR
40.0000 mg | Freq: Two times a day (BID) | INTRAMUSCULAR | Status: DC
  Administered 2023-04-20 – 2023-04-21 (×4): 40 mg via INTRAVENOUS
  Filled 2023-04-20 (×3): qty 1

## 2023-04-20 MED ORDER — KETOROLAC TROMETHAMINE 30 MG/ML IJ SOLN: 15.0000 mg | Freq: Four times a day (QID) | INTRAMUSCULAR | Status: DC | PRN

## 2023-04-20 MED ORDER — IPRATROPIUM-ALBUTEROL 0.5-2.5 (3) MG/3ML IN SOLN
3.0000 mL | Freq: Four times a day (QID) | RESPIRATORY_TRACT | Status: DC
  Administered 2023-04-20 – 2023-04-22 (×6): 3 mL via RESPIRATORY_TRACT
  Filled 2023-04-20 (×6): qty 3

## 2023-04-20 MED ORDER — KETOROLAC TROMETHAMINE 30 MG/ML IJ SOLN
30.0000 mg | Freq: Four times a day (QID) | INTRAMUSCULAR | Status: DC | PRN
  Administered 2023-04-20: 30 mg via INTRAVENOUS
  Filled 2023-04-20: qty 1

## 2023-04-20 MED ORDER — ARFORMOTEROL TARTRATE 15 MCG/2ML IN NEBU
15.0000 ug | INHALATION_SOLUTION | Freq: Two times a day (BID) | RESPIRATORY_TRACT | Status: DC
  Administered 2023-04-20 – 2023-04-22 (×5): 15 ug via RESPIRATORY_TRACT
  Filled 2023-04-20 (×6): qty 2

## 2023-04-20 MED ORDER — MELATONIN 5 MG PO TABS
5.0000 mg | ORAL_TABLET | Freq: Every day | ORAL | Status: DC
  Administered 2023-04-20 – 2023-04-21 (×3): 5 mg via ORAL
  Filled 2023-04-20 (×3): qty 1

## 2023-04-20 MED ORDER — ALBUTEROL SULFATE (2.5 MG/3ML) 0.083% IN NEBU
2.5000 mg | INHALATION_SOLUTION | RESPIRATORY_TRACT | Status: DC | PRN
  Administered 2023-04-20 – 2023-04-22 (×4): 2.5 mg via RESPIRATORY_TRACT
  Filled 2023-04-20 (×4): qty 3

## 2023-04-20 NOTE — Progress Notes (Signed)
 PROGRESS NOTE    Justin Reid  WUJ:[id number] DOB: [birth year] DOA: 04/19/2023 PCP: Pcp, No    Brief Narrative:  43 year old male with history of asthma, hypertension, morbid obesity, continued tobacco use, frequent hospitalization for asthma exacerbation, who presents to the emergency department for chief concerns of shortness of breath.   He reports that he started having shortness of breath this morning.  He denies trauma to his person.  He reports compliance with his home inhalers.  He denies chest pain, dysuria, hematuria, diarrhea, blood in his stool, swelling in his legs, syncope, loss of consciousness.  He denies fever at home.  He denies cough, sputum production.   He reports he has been using his home inhaler as appropriate.  He states he has nebulizers that he has use, however on med reconciliation I do not see nebulizers. He reports he does not have a lung doctor.   Assessment & Plan:   Principal Problem:   Acute severe exacerbation of severe persistent asthma Active Problems:   Asthma   OSA (obstructive sleep apnea)   Obesity, Class III, BMI 40-49.9 (morbid obesity) (HCC)   Asthma exacerbation   HTN (hypertension)   GERD (gastroesophageal reflux disease)   Truncal obesity   Tobacco use   COPD exacerbation (HCC)   Atypical chest pain   Acute severe exacerbation of severe persistent asthma Recurrent admissions for similar complaints Plan: Continue IV steroids Bronchodilators Continuous pulse ox for now Wean oxygen as tolerated Outpatient pulmonary referral  Obesity, Class III, BMI 40-49.9 (morbid obesity) (HCC) This complicates overall care and prognosis.  Extensive discussion at bedside regarding healthy and gradual weight loss Patient endorses understanding   OSA (obstructive sleep apnea) Nightly CPAP   Asthma Patient reports he has home inhalers and nebulizer that he has used at home prior to coming to the ED.  His asthma seems to be poorly  controlled.  He is not established with pulmonary as outpatient. Plan: Treatment for exacerbation as above Outpatient referral to pulmonary on discharge HTN (hypertension) Home amlodipine 5 mg daily, metoprolol succinate 50 mg daily were resumed Hydralazine 5 mg IV every 6 hours as needed for SBP >165, 5 days ordered   Tobacco use As needed nicotine patch ordered   DVT prophylaxis: Lovenox Code Status: Full Family Communication: Spouse at bedside 4/16 Disposition Plan: Status is: Inpatient Remains inpatient appropriate because: Acute asthma exacerbation   Level of care: Progressive  Consultants:  None  Procedures:  None  Antimicrobials: None   Subjective: Seen and examined.  Resting in bed.  Continues to be short of breath.  No pain complaints but endorses conversational dyspnea.  Objective: Vitals:   04/20/23 0200 04/20/23 0333 04/20/23 0822 04/20/23 1000  BP:  (!) 138/91 120/76   Pulse:  81    Resp: 18 18 (!) 22 18  Temp:  97.8 F (36.6 C)    TempSrc:      SpO2:  96% 100%   Weight:      Height:        Intake/Output Summary (Last 24 hours) at 04/20/2023 1346 Last data filed at 04/20/2023 0900 Gross per 24 hour  Intake 870 ml  Output --  Net 870 ml   Filed Weights   04/19/23 1514 04/19/23 2132  Weight: (!) 155.1 kg (!) 152.2 kg    Examination:  General exam: Appears fatigued Respiratory system: Diffuse end expiratory wheeze bilaterally.  Tachypneic.  3 L Cardiovascular system: S1-2, tachycardic, regular rhythm, no murmurs Gastrointestinal system: Obese,  soft, NT/ND, normal bowel sounds Central nervous system: Alert and oriented. No focal neurological deficits. Extremities: Symmetric 5 x 5 power. Skin: No rashes, lesions or ulcers Psychiatry: Judgement and insight appear normal. Mood & affect appropriate.     Data Reviewed: I have personally reviewed following labs and imaging studies  CBC: Recent Labs  Lab 04/19/23 1515 04/20/23 0353  WBC  7.2 8.6  HGB 13.8 13.2  HCT 43.2 41.4  MCV 87.4 86.8  PLT 400 412*   Basic Metabolic Panel: Recent Labs  Lab 04/19/23 1515 04/20/23 0353  NA 140 135  K 4.0 4.8  CL 105 102  CO2 25 23  GLUCOSE 130* 175*  BUN 18 18  CREATININE 0.87 0.95  CALCIUM 9.2 9.1  MG 2.5*  --    GFR: Estimated Creatinine Clearance: 149.5 mL/min (by C-G formula based on SCr of 0.95 mg/dL). Liver Function Tests: No results for input(s): "AST", "ALT", "ALKPHOS", "BILITOT", "PROT", "ALBUMIN" in the last 168 hours. No results for input(s): "LIPASE", "AMYLASE" in the last 168 hours. No results for input(s): "AMMONIA" in the last 168 hours. Coagulation Profile: No results for input(s): "INR", "PROTIME" in the last 168 hours. Cardiac Enzymes: No results for input(s): "CKTOTAL", "CKMB", "CKMBINDEX", "TROPONINI" in the last 168 hours. BNP (last 3 results) No results for input(s): "PROBNP" in the last 8760 hours. HbA1C: No results for input(s): "HGBA1C" in the last 72 hours. CBG: No results for input(s): "GLUCAP" in the last 168 hours. Lipid Profile: No results for input(s): "CHOL", "HDL", "LDLCALC", "TRIG", "CHOLHDL", "LDLDIRECT" in the last 72 hours. Thyroid Function Tests: No results for input(s): "TSH", "T4TOTAL", "FREET4", "T3FREE", "THYROIDAB" in the last 72 hours. Anemia Panel: No results for input(s): "VITAMINB12", "FOLATE", "FERRITIN", "TIBC", "IRON", "RETICCTPCT" in the last 72 hours. Sepsis Labs: No results for input(s): "PROCALCITON", "LATICACIDVEN" in the last 168 hours.  No results found for this or any previous visit (from the past 240 hours).       Radiology Studies: DG Chest 2 View Result Date: 04/19/2023 CLINICAL DATA:  cp, sob EXAM: CHEST - 2 VIEW COMPARISON:  04/09/2023 FINDINGS: Lungs are clear.  No pneumothorax. Heart size and mediastinal contours are within normal limits. No effusion. Possible free osteochondral bodies in the left shoulder. IMPRESSION: No acute cardiopulmonary  disease. Electronically Signed   By: Nicoletta Barrier M.D.   On: 04/19/2023 17:54        Scheduled Meds:  amLODipine  5 mg Oral Daily   arformoterol  15 mcg Nebulization BID   budesonide (PULMICORT) nebulizer solution  0.25 mg Nebulization BID   enoxaparin (LOVENOX) injection  40 mg Subcutaneous Q24H   loratadine  10 mg Oral Daily   melatonin  5 mg Oral QHS   methylPREDNISolone (SOLU-MEDROL) injection  40 mg Intravenous Q12H   metoprolol succinate  50 mg Oral Daily   montelukast  10 mg Oral Daily   pantoprazole  40 mg Oral Daily   Continuous Infusions:   LOS: 0 days   Tiajuana Fluke, MD [facility]   If 7PM-7AM, please contact night-coverage  04/20/2023, 1:46 PM

## 2023-04-20 NOTE — Plan of Care (Signed)
  Problem: Education: Goal: Knowledge of General Education information will improve Description: Including pain rating scale, medication(s)/side effects and non-pharmacologic comfort measures Outcome: Progressing   Problem: Clinical Measurements: Goal: Ability to maintain clinical measurements within normal limits will improve Outcome: Progressing Goal: Respiratory complications will improve Outcome: Progressing Goal: Cardiovascular complication will be avoided Outcome: Progressing   Problem: Activity: Goal: Risk for activity intolerance will decrease Outcome: Progressing   Problem: Coping: Goal: Level of anxiety will decrease Outcome: Progressing   Problem: Pain Managment: Goal: General experience of comfort will improve and/or be controlled Outcome: Progressing   Problem: Activity: Goal: Ability to tolerate increased activity will improve Outcome: Progressing Goal: Will verbalize the importance of balancing activity with adequate rest periods Outcome: Progressing   Problem: Respiratory: Goal: Ability to maintain a clear airway will improve Outcome: Progressing Goal: Levels of oxygenation will improve Outcome: Progressing Goal: Ability to maintain adequate ventilation will improve Outcome: Progressing

## 2023-04-20 NOTE — Progress Notes (Addendum)
 CROSS COVER NOTE  NAME: Justin Reid MRN: [id number] DOB : Sep 12, 1980  Significant event: Chest pain and worsening trouble breathing   Concern as stated by nurse / staff   patient also complain of chest pain on his right side, 10/10 on pain scale he describe it as squeezing, I did EKG   2. This patient just was admitted to the floor. He was in the ED for asthma exacerbation and was on Bipap just before coming to the floor. His wheezing is audible and he feels like he can't breath, he can't talk, breathing 45 times a minute and is sating 96 on RA. I have had respiratory come to the room and she is in agreement with me, it does not sound like he is moving any air. On a side note, he is in 10/10 pain in his back (chronic) and he doesn't have his home Oxy ordered    Pertinent findings on chart review: Patient admitted earlier today with asthma exacerbation and was also having back pain.  Required BiPAP Had a negative troponin of 3 earlier    04/19/2023   11:05 PM 04/19/2023   10:08 PM 04/19/2023    9:32 PM  Vitals with BMI  Height   5\' 9"   Weight   335 lbs 9 oz  BMI   49.53  Systolic 139  130  Diastolic 95  72  Pulse 93 95 95   Cardiac Panel (last 3 results) Recent Labs    04/19/23 1515 04/20/23 0055  TROPONINIHS 5 3        Patient assessment and physical evaluation Review of Systems  HENT:  Negative for congestion.   Cardiovascular:  Positive for chest pain. Negative for leg swelling.  Gastrointestinal:  Negative for nausea and vomiting.      04/19/2023   11:05 PM 04/19/2023   10:08 PM 04/19/2023    9:32 PM  Vitals with BMI  Height   5\' 9"   Weight   335 lbs 9 oz  BMI   49.53  Systolic 139  130  Diastolic 95  72  Pulse 93 95 95    Physical Exam Vitals and nursing note reviewed.  Constitutional:      Appearance: He is morbidly obese.     Comments: Bipap mask on, lying flat  HENT:     Head: Normocephalic and atraumatic.  Cardiovascular:     Rate and  Rhythm: Normal rate and regular rhythm.     Heart sounds: Normal heart sounds.  Pulmonary:     Effort: Pulmonary effort is normal.     Breath sounds: Normal breath sounds.  Abdominal:     Palpations: Abdomen is soft.     Tenderness: There is no abdominal tenderness.  Neurological:     Mental Status: Mental status is at baseline.      Workup   Assessment and  Interventions   Assessment:  -Chest pain, suspect musculoskeletal secondary to increased work of breathing related to severe asthma exacerbation  -Severe asthma exacerbation with Worsening resp distress after removal of Bipap  Plan: BiPAP restarted with improvement  Toradol as needed for likely musculoskeletal chest pain.  Continue home oxycodone Continue close monitoring     Reassessment at 1:33am pm 4/15 Chest  pain resolved     CRITICAL CARE Performed by: Andris Baumann   Total critical care time: 35 minutes  Critical care time was exclusive of separately billable procedures and treating other patients.  Critical  care was necessary to treat or prevent imminent or life-threatening deterioration.  Critical care was time spent personally by me on the following activities: development of treatment plan with patient and/or surrogate as well as nursing, discussions with consultants, evaluation of patient's response to treatment, examination of patient, obtaining history from patient or surrogate, ordering and performing treatments and interventions, ordering and review of laboratory studies, ordering and review of radiographic studies, pulse oximetry and re-evaluation of patient's condition.

## 2023-04-20 NOTE — Plan of Care (Signed)

## 2023-04-21 NOTE — TOC Progression Note (Signed)
 Transition of Care [facility]) - Progression Note    Patient Details  Name: Justin Reid MRN: [id number] Date of Birth: April 12, 1980  Transition of [facility]) CM/SW Contact  Baird Bombard, RN Phone Number: 04/21/2023, 2:46 PM  Clinical Narrative:    TOC continuing to follow patient's progress throughout discharge planning.        Expected Discharge Plan and Services                                               Social Determinants of Health (SDOH) Interventions SDOH Screenings   Food Insecurity: No Food Insecurity (04/19/2023)  Recent Concern: Food Insecurity - Food Insecurity Present (03/25/2023)  Housing: Low Risk  (04/19/2023)  Recent Concern: Housing - High Risk (03/25/2023)  Transportation Needs: No Transportation Needs (04/19/2023)  Utilities: Not At Risk (04/19/2023)  Recent Concern: Utilities - At Risk (03/07/2023)  Social Connections: Moderately Isolated (04/19/2023)  Tobacco Use: High Risk (04/19/2023)    Readmission Risk Interventions    04/03/2023   12:24 PM 03/08/2023    1:02 PM  Readmission Risk Prevention Plan  Transportation Screening Complete Complete  PCP or Specialist Appt within 3-5 Days Complete --  HRI or Home Care Consult Complete Complete  Social Work Consult for Recovery Care Planning/Counseling Complete Complete  Palliative Care Screening Not Applicable Not Applicable  Medication Review Oceanographer) Complete Referral to Pharmacy

## 2023-04-21 NOTE — Progress Notes (Signed)
 PROGRESS NOTE    Justin Reid  UEA:[id number] DOB: 29-Jun-1980 DOA: 04/19/2023 PCP: Pcp, No    Brief Narrative:  43 year old male with history of asthma, hypertension, morbid obesity, continued tobacco use, frequent hospitalization for asthma exacerbation, who presents to the emergency department for chief concerns of shortness of breath.   He reports that he started having shortness of breath this morning.  He denies trauma to his person.  He reports compliance with his home inhalers.  He denies chest pain, dysuria, hematuria, diarrhea, blood in his stool, swelling in his legs, syncope, loss of consciousness.  He denies fever at home.  He denies cough, sputum production.   He reports he has been using his home inhaler as appropriate.  He states he has nebulizers that he has use, however on med reconciliation I do not see nebulizers. He reports he does not have a lung doctor.   Assessment & Plan:   Principal Problem:   Acute severe exacerbation of severe persistent asthma Active Problems:   Asthma   OSA (obstructive sleep apnea)   Obesity, Class III, BMI 40-49.9 (morbid obesity) (HCC)   Asthma exacerbation   HTN (hypertension)   GERD (gastroesophageal reflux disease)   Truncal obesity   Tobacco use   COPD exacerbation (HCC)   Atypical chest pain  Acute severe exacerbation of severe persistent asthma Recurrent admissions for similar complaints Improving over interval Plan: Continue IV steroids Continue bronchodilators Wean oxygen as tolerated NIPPV as needed Outpatient pulmonary referral on discharge  Obesity, Class III, BMI 40-49.9 (morbid obesity) (HCC) This complicates overall care and prognosis.  Counseled patient Patient endorses understanding   OSA (obstructive sleep apnea) Nightly CPAP   Asthma Patient reports he has home inhalers and nebulizer that he has used at home prior to coming to the ED.  His asthma seems to be poorly controlled.  He is not  established with pulmonary as outpatient. Plan: Treatment for exacerbation as above Outpatient referral to pulmonary on discharge  HTN (hypertension) Home amlodipine 5 mg daily, metoprolol succinate 50 mg daily were resumed Hydralazine 5 mg IV every 6 hours as needed for SBP >165, 5 days ordered   Tobacco use As needed nicotine patch ordered   DVT prophylaxis: Lovenox Code Status: Full Family Communication: Spouse at bedside 4/16, 4/17 Disposition Plan: Status is: Inpatient Remains inpatient appropriate because: Acute asthma exacerbation   Level of care: Progressive  Consultants:  None  Procedures:  None  Antimicrobials: None   Subjective: Seen and examined.  Sitting up in bed.  Dyspnea improved from prior.  Objective: Vitals:   04/20/23 2344 04/21/23 0418 04/21/23 0819 04/21/23 1117  BP: 106/67 134/81 (!) 150/93 135/67  Pulse: 72 87 74 83  Resp: 18 18 16 17   Temp: 97.6 F (36.4 C) 97.9 F (36.6 C) (!) 97.4 F (36.3 C) (!) 96.5 F (35.8 C)  TempSrc: Oral  Axillary Axillary  SpO2: 98% 100% 98% 99%  Weight:      Height:        Intake/Output Summary (Last 24 hours) at 04/21/2023 1120 Last data filed at 04/21/2023 0900 Gross per 24 hour  Intake 240 ml  Output --  Net 240 ml   Filed Weights   04/19/23 1514 04/19/23 2132  Weight: (!) 155.1 kg (!) 152.2 kg    Examination:  General exam: Fatigued otherwise stable Respiratory system: Mild end expiratory wheeze.  Improved air entry.  Normal work of breathing.  3 L Cardiovascular system: S1-S2, RRR, no murmurs,  no pedal edema Gastrointestinal system: Obese, soft, NT/ND, normal bowel sounds Central nervous system: Alert and oriented. No focal neurological deficits. Extremities: Symmetric 5 x 5 power. Skin: No rashes, lesions or ulcers Psychiatry: Judgement and insight appear normal. Mood & affect appropriate.     Data Reviewed: I have personally reviewed following labs and imaging studies  CBC: Recent  Labs  Lab 04/19/23 1515 04/20/23 0353  WBC 7.2 8.6  HGB 13.8 13.2  HCT 43.2 41.4  MCV 87.4 86.8  PLT 400 412*   Basic Metabolic Panel: Recent Labs  Lab 04/19/23 1515 04/20/23 0353  NA 140 135  K 4.0 4.8  CL 105 102  CO2 25 23  GLUCOSE 130* 175*  BUN 18 18  CREATININE 0.87 0.95  CALCIUM 9.2 9.1  MG 2.5*  --    GFR: Estimated Creatinine Clearance: 149.5 mL/min (by C-G formula based on SCr of 0.95 mg/dL). Liver Function Tests: No results for input(s): "AST", "ALT", "ALKPHOS", "BILITOT", "PROT", "ALBUMIN" in the last 168 hours. No results for input(s): "LIPASE", "AMYLASE" in the last 168 hours. No results for input(s): "AMMONIA" in the last 168 hours. Coagulation Profile: No results for input(s): "INR", "PROTIME" in the last 168 hours. Cardiac Enzymes: No results for input(s): "CKTOTAL", "CKMB", "CKMBINDEX", "TROPONINI" in the last 168 hours. BNP (last 3 results) No results for input(s): "PROBNP" in the last 8760 hours. HbA1C: No results for input(s): "HGBA1C" in the last 72 hours. CBG: No results for input(s): "GLUCAP" in the last 168 hours. Lipid Profile: No results for input(s): "CHOL", "HDL", "LDLCALC", "TRIG", "CHOLHDL", "LDLDIRECT" in the last 72 hours. Thyroid Function Tests: No results for input(s): "TSH", "T4TOTAL", "FREET4", "T3FREE", "THYROIDAB" in the last 72 hours. Anemia Panel: No results for input(s): "VITAMINB12", "FOLATE", "FERRITIN", "TIBC", "IRON", "RETICCTPCT" in the last 72 hours. Sepsis Labs: No results for input(s): "PROCALCITON", "LATICACIDVEN" in the last 168 hours.  No results found for this or any previous visit (from the past 240 hours).       Radiology Studies: DG Chest 2 View Result Date: 04/19/2023 CLINICAL DATA:  cp, sob EXAM: CHEST - 2 VIEW COMPARISON:  04/09/2023 FINDINGS: Lungs are clear.  No pneumothorax. Heart size and mediastinal contours are within normal limits. No effusion. Possible free osteochondral bodies in the left  shoulder. IMPRESSION: No acute cardiopulmonary disease. Electronically Signed   By: Nicoletta Barrier M.D.   On: 04/19/2023 17:54        Scheduled Meds:  amLODipine  5 mg Oral Daily   arformoterol  15 mcg Nebulization BID   budesonide (PULMICORT) nebulizer solution  0.25 mg Nebulization BID   enoxaparin (LOVENOX) injection  0.5 mg/kg Subcutaneous Q24H   ipratropium-albuterol  3 mL Nebulization QID   loratadine  10 mg Oral Daily   melatonin  5 mg Oral QHS   methylPREDNISolone (SOLU-MEDROL) injection  40 mg Intravenous Q12H   metoprolol succinate  50 mg Oral Daily   montelukast  10 mg Oral Daily   pantoprazole  40 mg Oral Daily   Continuous Infusions:   LOS: 1 day   Tiajuana Fluke, MD [facility]   If 7PM-7AM, please contact night-coverage  04/21/2023, 11:20 AM

## 2023-04-21 NOTE — Plan of Care (Signed)

## 2023-04-21 NOTE — Plan of Care (Signed)

## 2023-04-22 ENCOUNTER — Other Ambulatory Visit: Payer: Self-pay

## 2023-04-22 MED ORDER — OXYCODONE HCL 5 MG PO TABS
5.0000 mg | ORAL_TABLET | Freq: Four times a day (QID) | ORAL | 0 refills | Status: DC | PRN
  Filled 2023-04-22: qty 20, 5d supply, fill #0

## 2023-04-22 MED ORDER — METOPROLOL SUCCINATE ER 50 MG PO TB24
50.0000 mg | ORAL_TABLET | Freq: Every day | ORAL | 1 refills | Status: DC
  Filled 2023-04-22: qty 30, 30d supply, fill #0

## 2023-04-22 MED ORDER — NICOTINE 14 MG/24HR TD PT24
14.0000 mg | MEDICATED_PATCH | Freq: Every day | TRANSDERMAL | 0 refills | Status: DC | PRN
  Filled 2023-04-22: qty 28, 28d supply, fill #0

## 2023-04-22 MED ORDER — AMLODIPINE BESYLATE 5 MG PO TABS
5.0000 mg | ORAL_TABLET | Freq: Every day | ORAL | 1 refills | Status: DC
  Filled 2023-04-22: qty 30, 30d supply, fill #0

## 2023-04-22 MED ORDER — ALBUTEROL SULFATE HFA 108 (90 BASE) MCG/ACT IN AERS
2.0000 | INHALATION_SPRAY | RESPIRATORY_TRACT | 1 refills | Status: DC | PRN
  Filled 2023-04-22: qty 6.7, 16d supply, fill #0

## 2023-04-22 MED ORDER — IPRATROPIUM-ALBUTEROL 0.5-2.5 (3) MG/3ML IN SOLN: 3.0000 mL | Freq: Three times a day (TID) | RESPIRATORY_TRACT | Status: DC

## 2023-04-22 MED ORDER — METHYLPREDNISOLONE SODIUM SUCC 40 MG IJ SOLR
40.0000 mg | Freq: Every day | INTRAMUSCULAR | Status: DC
  Administered 2023-04-22: 40 mg via INTRAVENOUS
  Filled 2023-04-22: qty 1

## 2023-04-22 MED ORDER — PANTOPRAZOLE SODIUM 40 MG PO TBEC
40.0000 mg | DELAYED_RELEASE_TABLET | Freq: Every day | ORAL | 1 refills | Status: DC
  Filled 2023-04-22: qty 30, 30d supply, fill #0

## 2023-04-22 MED ORDER — FLUTICASONE-SALMETEROL 250-50 MCG/ACT IN AEPB
1.0000 | INHALATION_SPRAY | Freq: Two times a day (BID) | RESPIRATORY_TRACT | 1 refills | Status: DC
  Filled 2023-04-22: qty 60, 30d supply, fill #0

## 2023-04-22 MED ORDER — PREDNISONE 20 MG PO TABS
40.0000 mg | ORAL_TABLET | Freq: Every day | ORAL | 0 refills | Status: DC
  Filled 2023-04-22: qty 10, 5d supply, fill #0

## 2023-04-22 NOTE — Discharge Summary (Signed)
 Physician Discharge Summary  Justin Reid FMW:[id number] DOB: 01-22-1980 DOA: 04/19/2023  PCP: Pcp, No  Admit date: 04/19/2023 Discharge date: 04/22/2023  Admitted From: Home Disposition:  Home  Recommendations for Outpatient Follow-up:  Follow up with PCP in 1-2 weeks Ambulatory referral to pulmonology  Home Health: No Equipment/Devices: None  Discharge Condition: Stable CODE STATUS: Full Diet recommendation: Heart healthy  Brief/Interim Summary:  43 year old male with history of asthma, hypertension, morbid obesity, continued tobacco use, frequent hospitalization for asthma exacerbation, who presents to the emergency department for chief concerns of shortness of breath.    He reports that he started having shortness of breath this morning.  He denies trauma to his person.  He reports compliance with his home inhalers.  He denies chest pain, dysuria, hematuria, diarrhea, blood in his stool, swelling in his legs, syncope, loss of consciousness.  He denies fever at home.  He denies cough, sputum production.   He reports he has been using his home inhaler as appropriate.  He states he has nebulizers that he has use, however on med reconciliation I do not see nebulizers. He reports he does not have a lung doctor.        Discharge Diagnoses:  Principal Problem:   Acute severe exacerbation of severe persistent asthma Active Problems:   Asthma   OSA (obstructive sleep apnea)   Obesity, Class III, BMI 40-49.9 (morbid obesity) (HCC)   Asthma exacerbation   HTN (hypertension)   GERD (gastroesophageal reflux disease)   Truncal obesity   Tobacco use   COPD exacerbation (HCC)   Atypical chest pain  Acute severe exacerbation of severe persistent asthma Recurrent admissions for similar complaints Improving over interval Plan: Discharge home.  On room air.  Short course of steroid.  Bronchodilators prescribed.  All medications sent to [facility] pharmacy.  Ambulatory referral to  pulmonology on discharge.  Referred to [facility] clinic for pulmonary.  Counseled on smoking cessation.  Discharge Instructions  Discharge Instructions     Diet - low sodium heart healthy   Complete by: As directed    Increase activity slowly   Complete by: As directed    Pulmonary Visit   Complete by: As directed    Asthma, severe persistent   Reason for referral: Other Pulmonary      Allergies as of 04/22/2023   No Known Allergies      Medication List     TAKE these medications    acetaminophen  325 MG tablet Commonly known as: TYLENOL  Take 2 tablets (650 mg total) by mouth every 6 (six) hours as needed for mild pain (pain score 1-3) or fever.   albuterol  108 (90 Base) MCG/ACT inhaler Commonly known as: VENTOLIN  HFA Inhale 2 puffs into the lungs every 4 (four) hours as needed for wheezing or shortness of breath.   amLODipine  5 MG tablet Commonly known as: NORVASC  Take 1 tablet (5 mg total) by mouth daily.   Dextromethorphan -guaiFENesin  20-200 MG/20ML Liqd Take 10 mLs by mouth every 6 (six) hours as needed.   loratadine  10 MG tablet Commonly known as: CLARITIN  Take 1 tablet (10 mg total) by mouth daily.   metoprolol  succinate 50 MG 24 hr tablet Commonly known as: TOPROL -XL Take 1 tablet (50 mg total) by mouth daily. Take with or immediately following a meal.   montelukast  10 MG tablet Commonly known as: SINGULAIR  Take 1 tablet (10 mg total) by mouth daily.   nicotine  14 mg/24hr patch Commonly known as: NICODERM CQ  - dosed in mg/24  hours Place 1 patch (14 mg total) onto the skin daily as needed (nicotine  craving).   oxyCODONE  5 MG immediate release tablet Commonly known as: Oxy IR/ROXICODONE  Take 1 tablet (5 mg total) by mouth every 6 (six) hours as needed for moderate pain (pain score 4-6) or severe pain (pain score 7-10).   pantoprazole  40 MG tablet Commonly known as: PROTONIX  Take 1 tablet (40 mg total) by mouth daily.   predniSONE  20 MG  tablet Commonly known as: DELTASONE  Take 2 tablets (40 mg total) by mouth daily with breakfast for 5 days.   Wixela Inhub  250-50 MCG/ACT Aepb Generic drug: fluticasone -salmeterol Inhale 1 puff into the lungs in the morning and at bedtime.        Follow-up Information     Parris Manna, MD Follow up.   Specialty: Pulmonary Disease Why: A referral has been sent to Dr. Parris.  Please contact the office if you do not hear from them within 1 week of discharge from the hospital Contact information: [address] [place] [address] [phone]                No Known Allergies  Consultations: None   Procedures/Studies: DG Chest 2 View Result Date: 04/19/2023 CLINICAL DATA:  cp, sob EXAM: CHEST - 2 VIEW COMPARISON:  04/09/2023 FINDINGS: Lungs are clear.  No pneumothorax. Heart size and mediastinal contours are within normal limits. No effusion. Possible free osteochondral bodies in the left shoulder. IMPRESSION: No acute cardiopulmonary disease. Electronically Signed   By: JONETTA Faes M.D.   On: 04/19/2023 17:54   DG Lumbar Spine 2-3 Views Result Date: 04/11/2023 CLINICAL DATA:  Low back pain. EXAM: LUMBAR SPINE - 2-3 VIEW COMPARISON:  None Available. FINDINGS: Five non-rib-bearing lumbar vertebra. Normal lumbar alignment. Anterior spurring at T12-L1. Slight L3-L4 disc space narrowing. No evidence of fracture, pars defects or focal bone abnormality. Sacroiliac joints are congruent. IMPRESSION: Mild degenerative disc disease at T12-L1 and L3-L4. Electronically Signed   By: Andrea Gasman M.D.   On: 04/11/2023 16:29   DG Chest Portable 1 View Result Date: 04/09/2023 CLINICAL DATA:  Cough. EXAM: PORTABLE CHEST 1 VIEW COMPARISON:  Chest radiograph dated 04/01/2023 and CT dated 04/02/2023. FINDINGS: No focal consolidation, pleural effusion, pneumothorax. The cardiac silhouette is within limits. No acute osseous pathology. IMPRESSION: No active disease. Electronically  Signed   By: Vanetta Chou M.D.   On: 04/09/2023 17:10   CT Angio Chest Pulmonary Embolism (PE) W or WO Contrast Result Date: 04/02/2023 CLINICAL DATA:  Wheezing and shortness of breath EXAM: CT ANGIOGRAPHY CHEST WITH CONTRAST TECHNIQUE: Multidetector CT imaging of the chest was performed using the standard protocol during bolus administration of intravenous contrast. Multiplanar CT image reconstructions and MIPs were obtained to evaluate the vascular anatomy. RADIATION DOSE REDUCTION: This exam was performed according to the departmental dose-optimization program which includes automated exposure control, adjustment of the mA and/or kV according to patient size and/or use of iterative reconstruction technique. CONTRAST:  OMNIPAQUE  IOHEXOL  350 MG/ML SOLN COMPARISON:  Same day chest radiograph and CTA chest 02/21/2023 FINDINGS: Cardiovascular: Negative for acute pulmonary embolism. Normal caliber thoracic aorta. Aberrant right subclavian artery. No pericardial effusion. Mediastinum/Nodes: Trachea and esophagus are unremarkable. No thoracic adenopathy Lungs/Pleura: No focal consolidation, pleural effusion, or pneumothorax. Upper Abdomen: No acute abnormality. Musculoskeletal: No acute fracture. Review of the MIP images confirms the above findings. IMPRESSION: Negative for acute pulmonary embolism. No acute abnormality in the chest. Electronically Signed   By: Norman  Stutzman M.D.   On: 04/02/2023 02:04   DG Chest 2 View Result Date: 04/01/2023 CLINICAL DATA:  Wheezing and shortness of breath. EXAM: CHEST - 2 VIEW COMPARISON:  Chest x-ray dated March 24, 2023. FINDINGS: The heart size and mediastinal contours are within normal limits. Normal pulmonary vascularity. No focal consolidation, pleural effusion, or pneumothorax. No acute osseous abnormality. Advanced degenerative changes of the left shoulder with multiple glenohumeral intra-articular bodies again noted. IMPRESSION: 1. No acute cardiopulmonary  disease. Electronically Signed   By: Elsie ONEIDA Shoulder M.D.   On: 04/01/2023 17:10   CT SOFT TISSUE NECK W CONTRAST Result Date: 03/26/2023 CLINICAL DATA:  Initial evaluation for epiglottitis or tonsillitis suspected. EXAM: CT NECK WITH CONTRAST TECHNIQUE: Multidetector CT imaging of the neck was performed using the standard protocol following the bolus administration of intravenous contrast. RADIATION DOSE REDUCTION: This exam was performed according to the departmental dose-optimization program which includes automated exposure control, adjustment of the mA and/or kV according to patient size and/or use of iterative reconstruction technique. CONTRAST:  75mL OMNIPAQUE  IOHEXOL  300 MG/ML  SOLN COMPARISON:  None Available. FINDINGS: Pharynx and larynx: Oral cavity within normal limits. Palatine tonsils are symmetric and within normal limits. No evidence for acute tonsillitis by CT. Remainder of the oropharynx and nasopharynx within normal limits. Negative epiglottis. Hypopharynx and supraglottic larynx within normal limits. No retropharyngeal collection or swelling. Negative glottis. Six visualized subglottic airway clear. Salivary glands: Salivary glands including the parotid and submandibular glands are within normal limits. Thyroid : Visualized thyroid  normal. Lymph nodes: No enlarged or pathologic adenopathy within the neck. Vascular: Normal intravascular enhancement seen within the neck. Limited intracranial: Unremarkable. Visualized orbits: Globes orbital soft tissues within normal limits. Mastoids and visualized paranasal sinuses: Mild mucosal thickening present about the inferior aspect of the right maxillary sinus. Paranasal sinuses are otherwise clear. Mastoid air cells and middle ear cavities are well pneumatized and free of fluid. Skeleton: No discrete or worrisome osseous lesions. Upper chest: Not included on this exam. Other: None. IMPRESSION: Negative CT of the neck. No acute inflammatory changes or  other abnormality identified. Electronically Signed   By: Morene Hoard M.D.   On: 03/26/2023 20:35   CT SINUS WO CONTRAST Result Date: 03/26/2023 CLINICAL DATA:  Sinusitis EXAM: CT MAXILLOFACIAL WITHOUT CONTRAST TECHNIQUE: Multidetector CT imaging of the maxillofacial structures was performed. Multiplanar CT image reconstructions were also generated. RADIATION DOSE REDUCTION: This exam was performed according to the departmental dose-optimization program which includes automated exposure control, adjustment of the mA and/or kV according to patient size and/or use of iterative reconstruction technique. COMPARISON:  None Available. FINDINGS: Osseous: Mastoid air cells are clear. Mild anterior subluxation of the bilateral mandibular heads with respect to the mandibular fossa. No mandibular fracture. Pterygoid plates and zygomatic arches appear intact. Chronic appearing right nasal bone deformity Orbits: Remote appearing fracture deformity medial wall right orbit. No acute osseous abnormality. Globes are unremarkable Sinuses: No acute fluid levels. Frontal sinuses are clear. Minimal mucosal thickening in the anterior ethmoid air cells. Small mucous retention cysts in the right maxillary sinus. Sphenoid sinuses are clear. No sinus wall fracture. Ostiomeatal complexes appear patent bilaterally. Moderate leftward deviation of the mid nasal septum. Soft tissues: No acute findings. Limited intracranial: Negative IMPRESSION: 1. Minimal mucosal thickening in the anterior ethmoid air cells and small mucous retention cysts in the right maxillary sinus. No acute fluid levels to suggest acute sinusitis. 2. Moderate leftward deviation of the mid nasal septum. 3. No acute facial bone fracture.  Suspect chronic right nasal bone fracture Electronically Signed   By: Luke Bun M.D.   On: 03/26/2023 17:48   DG Chest 2 View Result Date: 03/24/2023 CLINICAL DATA:  Shortness of breath. EXAM: CHEST - 2 VIEW COMPARISON:   Chest radiographs 03/17/2023 FINDINGS: Cardiac silhouette and mediastinal contours are within normal limits. The lungs are clear. No pleural effusion pneumothorax. Mild multilevel degenerative disc changes of the thoracic spine. IMPRESSION: No active cardiopulmonary disease. Electronically Signed   By: Tanda Lyons M.D.   On: 03/24/2023 14:16      Subjective: Seen and examined on the day of discharge.  Stable no distress.  Appropriate discharge home.  Discharge Exam: Vitals:   04/22/23 0730 04/22/23 0800  BP: 133/87   Pulse: 73   Resp: 18   Temp: 97.9 F (36.6 C)   SpO2: 96% 95%   Vitals:   04/21/23 2319 04/22/23 0433 04/22/23 0730 04/22/23 0800  BP: (!) 143/86 (!) 157/96 133/87   Pulse: 79 78 73   Resp: 20 18 18    Temp: 98.6 F (37 C) 98.4 F (36.9 C) 97.9 F (36.6 C)   TempSrc:   Oral   SpO2: 99% 99% 96% 95%  Weight:      Height:        General: Pt is alert, awake, not in acute distress Cardiovascular: RRR, S1/S2 +, no rubs, no gallops Respiratory: CTA bilaterally, no wheezing, no rhonchi Abdominal: Soft, NT, ND, bowel sounds + Extremities: no edema, no cyanosis    The results of significant diagnostics from this hospitalization (including imaging, microbiology, ancillary and laboratory) are listed below for reference.     Microbiology: No results found for this or any previous visit (from the past 240 hours).   Labs: BNP (last 3 results) Recent Labs    03/24/23 1208 04/01/23 1610 04/09/23 1552  BNP 10.6 11.4 4.5   Basic Metabolic Panel: Recent Labs  Lab 04/19/23 1515 04/20/23 0353  NA 140 135  K 4.0 4.8  CL 105 102  CO2 25 23  GLUCOSE 130* 175*  BUN 18 18  CREATININE 0.87 0.95  CALCIUM  9.2 9.1  MG 2.5*  --    Liver Function Tests: No results for input(s): AST, ALT, ALKPHOS, BILITOT, PROT, ALBUMIN in the last 168 hours. No results for input(s): LIPASE, AMYLASE in the last 168 hours. No results for input(s): AMMONIA in the  last 168 hours. CBC: Recent Labs  Lab 04/19/23 1515 04/20/23 0353  WBC 7.2 8.6  HGB 13.8 13.2  HCT 43.2 41.4  MCV 87.4 86.8  PLT 400 412*   Cardiac Enzymes: No results for input(s): CKTOTAL, CKMB, CKMBINDEX, TROPONINI in the last 168 hours. BNP: Invalid input(s): POCBNP CBG: No results for input(s): GLUCAP in the last 168 hours. D-Dimer No results for input(s): DDIMER in the last 72 hours. Hgb A1c No results for input(s): HGBA1C in the last 72 hours. Lipid Profile No results for input(s): CHOL, HDL, LDLCALC, TRIG, CHOLHDL, LDLDIRECT in the last 72 hours. Thyroid  function studies No results for input(s): TSH, T4TOTAL, T3FREE, THYROIDAB in the last 72 hours.  Invalid input(s): FREET3 Anemia work up No results for input(s): VITAMINB12, FOLATE, FERRITIN, TIBC, IRON, RETICCTPCT in the last 72 hours. Urinalysis No results found for: COLORURINE, APPEARANCEUR, LABSPEC, PHURINE, GLUCOSEU, HGBUR, BILIRUBINUR, KETONESUR, PROTEINUR, UROBILINOGEN, NITRITE, LEUKOCYTESUR Sepsis Labs Recent Labs  Lab 04/19/23 1515 04/20/23 0353  WBC 7.2 8.6   Microbiology No results found for this or any previous visit (from the past  240 hours).   Time coordinating discharge: Over 30 minutes  SIGNED:   Calvin KATHEE Robson, MD  [facility] 04/22/2023, 1:17 PM Pager   If 7PM-7AM, please contact night-coverage

## 2023-04-22 NOTE — Plan of Care (Signed)

## 2023-04-25 ENCOUNTER — Inpatient Hospital Stay
Admission: EM | Admit: 2023-04-25 | Discharge: 2023-04-29 | DRG: 202 | Disposition: A | Discharge status: Home / Self Care | Payer: MEDICAID | Attending: Internal Medicine | Admitting: Internal Medicine

## 2023-04-25 ENCOUNTER — Emergency Department: Payer: MEDICAID

## 2023-04-25 ENCOUNTER — Other Ambulatory Visit: Payer: Self-pay

## 2023-04-25 ENCOUNTER — Encounter: Payer: Self-pay | Admitting: Emergency Medicine

## 2023-04-25 DIAGNOSIS — J45901 Unspecified asthma with (acute) exacerbation: Secondary | ICD-10-CM | POA: Diagnosis not present

## 2023-04-25 DIAGNOSIS — Z7151 Drug abuse counseling and surveillance of drug abuser: Secondary | ICD-10-CM

## 2023-04-25 DIAGNOSIS — G4733 Obstructive sleep apnea (adult) (pediatric): Secondary | ICD-10-CM | POA: Diagnosis present

## 2023-04-25 DIAGNOSIS — I10 Essential (primary) hypertension: Secondary | ICD-10-CM | POA: Diagnosis present

## 2023-04-25 DIAGNOSIS — Z6841 Body Mass Index (BMI) 40.0 and over, adult: Secondary | ICD-10-CM

## 2023-04-25 DIAGNOSIS — Z7951 Long term (current) use of inhaled steroids: Secondary | ICD-10-CM

## 2023-04-25 DIAGNOSIS — J4551 Severe persistent asthma with (acute) exacerbation: Principal | ICD-10-CM | POA: Diagnosis present

## 2023-04-25 DIAGNOSIS — I2510 Atherosclerotic heart disease of native coronary artery without angina pectoris: Secondary | ICD-10-CM | POA: Diagnosis present

## 2023-04-25 DIAGNOSIS — E873 Alkalosis: Secondary | ICD-10-CM | POA: Diagnosis present

## 2023-04-25 DIAGNOSIS — Z7952 Long term (current) use of systemic steroids: Secondary | ICD-10-CM

## 2023-04-25 DIAGNOSIS — Z1152 Encounter for screening for COVID-19: Secondary | ICD-10-CM

## 2023-04-25 DIAGNOSIS — Z716 Tobacco abuse counseling: Secondary | ICD-10-CM

## 2023-04-25 DIAGNOSIS — E785 Hyperlipidemia, unspecified: Secondary | ICD-10-CM | POA: Diagnosis present

## 2023-04-25 DIAGNOSIS — F17210 Nicotine dependence, cigarettes, uncomplicated: Secondary | ICD-10-CM | POA: Diagnosis present

## 2023-04-25 DIAGNOSIS — G8929 Other chronic pain: Secondary | ICD-10-CM | POA: Diagnosis present

## 2023-04-25 DIAGNOSIS — Z5989 Other problems related to housing and economic circumstances: Secondary | ICD-10-CM

## 2023-04-25 DIAGNOSIS — Z8249 Family history of ischemic heart disease and other diseases of the circulatory system: Secondary | ICD-10-CM

## 2023-04-25 DIAGNOSIS — M25461 Effusion, right knee: Secondary | ICD-10-CM | POA: Diagnosis present

## 2023-04-25 DIAGNOSIS — Z713 Dietary counseling and surveillance: Secondary | ICD-10-CM

## 2023-04-25 DIAGNOSIS — Z91199 Patient's noncompliance with other medical treatment and regimen due to unspecified reason: Secondary | ICD-10-CM

## 2023-04-25 DIAGNOSIS — F17200 Nicotine dependence, unspecified, uncomplicated: Secondary | ICD-10-CM | POA: Diagnosis present

## 2023-04-25 DIAGNOSIS — E66813 Obesity, class 3: Secondary | ICD-10-CM | POA: Diagnosis present

## 2023-04-25 DIAGNOSIS — R1031 Right lower quadrant pain: Secondary | ICD-10-CM | POA: Diagnosis not present

## 2023-04-25 DIAGNOSIS — R0602 Shortness of breath: Secondary | ICD-10-CM | POA: Diagnosis not present

## 2023-04-25 DIAGNOSIS — J45909 Unspecified asthma, uncomplicated: Secondary | ICD-10-CM | POA: Diagnosis present

## 2023-04-25 DIAGNOSIS — J383 Other diseases of vocal cords: Secondary | ICD-10-CM | POA: Diagnosis present

## 2023-04-25 DIAGNOSIS — Z720 Tobacco use: Secondary | ICD-10-CM | POA: Diagnosis present

## 2023-04-25 DIAGNOSIS — J9601 Acute respiratory failure with hypoxia: Secondary | ICD-10-CM | POA: Diagnosis present

## 2023-04-25 DIAGNOSIS — Z79899 Other long term (current) drug therapy: Secondary | ICD-10-CM

## 2023-04-25 DIAGNOSIS — M549 Dorsalgia, unspecified: Secondary | ICD-10-CM | POA: Diagnosis present

## 2023-04-25 DIAGNOSIS — M25462 Effusion, left knee: Secondary | ICD-10-CM | POA: Diagnosis not present

## 2023-04-25 LAB — CBC WITH DIFFERENTIAL/PLATELET
Abs Immature Granulocytes: 0.07 10*3/uL (ref 0.00–0.07)
Basophils Absolute: 0 10*3/uL (ref 0.0–0.1)
Basophils Relative: 0 %
Eosinophils Absolute: 0.3 10*3/uL (ref 0.0–0.5)
Eosinophils Relative: 3 %
HCT: 40.8 % (ref 39.0–52.0)
Hemoglobin: 13.3 g/dL (ref 13.0–17.0)
Immature Granulocytes: 1 %
Lymphocytes Relative: 26 %
Lymphs Abs: 2.7 10*3/uL (ref 0.7–4.0)
MCH: 27.7 pg (ref 26.0–34.0)
MCHC: 32.6 g/dL (ref 30.0–36.0)
MCV: 84.8 fL (ref 80.0–100.0)
Monocytes Absolute: 0.8 10*3/uL (ref 0.1–1.0)
Monocytes Relative: 7 %
Neutro Abs: 6.5 10*3/uL (ref 1.7–7.7)
Neutrophils Relative %: 63 %
Platelets: 407 10*3/uL — ABNORMAL HIGH (ref 150–400)
RBC: 4.81 MIL/uL (ref 4.22–5.81)
RDW: 14.6 % (ref 11.5–15.5)
WBC: 10.4 10*3/uL (ref 4.0–10.5)
nRBC: 0 % (ref 0.0–0.2)

## 2023-04-25 LAB — RESP PANEL BY RT-PCR (RSV, FLU A&B, COVID)  RVPGX2
Influenza A by PCR: NEGATIVE
Influenza B by PCR: NEGATIVE
Resp Syncytial Virus by PCR: NEGATIVE
SARS Coronavirus 2 by RT PCR: NEGATIVE

## 2023-04-25 LAB — BLOOD GAS, VENOUS
Acid-Base Excess: 3.4 mmol/L — ABNORMAL HIGH (ref 0.0–2.0)
Bicarbonate: 28.5 mmol/L — ABNORMAL HIGH (ref 20.0–28.0)
O2 Saturation: 74.8 %
Patient temperature: 37
pCO2, Ven: 44 mmHg (ref 44–60)
pH, Ven: 7.42 (ref 7.25–7.43)
pO2, Ven: 35 mmHg (ref 32–45)

## 2023-04-25 LAB — BASIC METABOLIC PANEL WITH GFR
Anion gap: 10 (ref 5–15)
BUN: 24 mg/dL — ABNORMAL HIGH (ref 6–20)
CO2: 26 mmol/L (ref 22–32)
Calcium: 8.9 mg/dL (ref 8.9–10.3)
Chloride: 99 mmol/L (ref 98–111)
Creatinine, Ser: 1 mg/dL (ref 0.61–1.24)
GFR, Estimated: >60 mL/min (ref >60)
Glucose, Bld: 90 mg/dL (ref 70–99)
Potassium: 3.7 mmol/L (ref 3.5–5.1)
Sodium: 135 mmol/L (ref 135–145)

## 2023-04-25 LAB — TROPONIN I (HIGH SENSITIVITY)
Troponin I (High Sensitivity): 6 ng/L (ref <18)
Troponin I (High Sensitivity): 4 ng/L (ref <18)

## 2023-04-25 MED ORDER — IPRATROPIUM-ALBUTEROL 0.5-2.5 (3) MG/3ML IN SOLN
3.0000 mL | Freq: Once | RESPIRATORY_TRACT | Status: AC
  Administered 2023-04-25: 3 mL via RESPIRATORY_TRACT

## 2023-04-25 MED ORDER — FLUTICASONE FUROATE-VILANTEROL 200-25 MCG/ACT IN AEPB
1.0000 | INHALATION_SPRAY | Freq: Every day | RESPIRATORY_TRACT | Status: DC
  Administered 2023-04-26 – 2023-04-29 (×4): 1 via RESPIRATORY_TRACT
  Filled 2023-04-25 (×2): qty 28

## 2023-04-25 MED ORDER — LORATADINE 10 MG PO TABS
10.0000 mg | ORAL_TABLET | Freq: Every day | ORAL | Status: DC
  Administered 2023-04-26 – 2023-04-29 (×4): 10 mg via ORAL
  Filled 2023-04-25 (×4): qty 1

## 2023-04-25 MED ORDER — SENNOSIDES-DOCUSATE SODIUM 8.6-50 MG PO TABS: 1.0000 | ORAL_TABLET | Freq: Every evening | ORAL | Status: DC | PRN

## 2023-04-25 MED ORDER — METHYLPREDNISOLONE SODIUM SUCC 125 MG IJ SOLR
80.0000 mg | INTRAMUSCULAR | Status: DC
  Administered 2023-04-25 – 2023-04-27 (×3): 80 mg via INTRAVENOUS
  Filled 2023-04-25 (×3): qty 2

## 2023-04-25 MED ORDER — ONDANSETRON HCL 4 MG/2ML IJ SOLN: 4.0000 mg | Freq: Four times a day (QID) | INTRAMUSCULAR | Status: DC | PRN

## 2023-04-25 MED ORDER — ONDANSETRON HCL 4 MG PO TABS
4.0000 mg | ORAL_TABLET | Freq: Four times a day (QID) | ORAL | Status: DC | PRN
Start: 2023-04-25 — End: 2023-04-29

## 2023-04-25 MED ORDER — ENOXAPARIN SODIUM 40 MG/0.4ML IJ SOSY
40.0000 mg | PREFILLED_SYRINGE | INTRAMUSCULAR | Status: DC
Start: 2023-04-25 — End: 2023-04-25

## 2023-04-25 MED ORDER — MAGNESIUM SULFATE 2 GM/50ML IV SOLN
INTRAVENOUS | Status: AC
  Administered 2023-04-25: 2 g via INTRAVENOUS
  Filled 2023-04-25: qty 50

## 2023-04-25 MED ORDER — METHYLPREDNISOLONE SODIUM SUCC 125 MG IJ SOLR
INTRAMUSCULAR | Status: AC
Start: 2023-04-25 — End: 2023-04-25
  Administered 2023-04-25: 125 mg via INTRAVENOUS
  Filled 2023-04-25: qty 2

## 2023-04-25 MED ORDER — DIPHENHYDRAMINE HCL 25 MG PO CAPS
25.0000 mg | ORAL_CAPSULE | Freq: Four times a day (QID) | ORAL | Status: DC | PRN
  Administered 2023-04-25 – 2023-04-28 (×4): 25 mg via ORAL
  Filled 2023-04-25 (×4): qty 1

## 2023-04-25 MED ORDER — ALBUTEROL SULFATE (2.5 MG/3ML) 0.083% IN NEBU
2.5000 mg | INHALATION_SOLUTION | RESPIRATORY_TRACT | Status: DC | PRN
  Administered 2023-04-26 – 2023-04-28 (×3): 2.5 mg via RESPIRATORY_TRACT
  Filled 2023-04-25 (×2): qty 3

## 2023-04-25 MED ORDER — AMLODIPINE BESYLATE 5 MG PO TABS
5.0000 mg | ORAL_TABLET | Freq: Every day | ORAL | Status: DC
  Administered 2023-04-26 – 2023-04-29 (×4): 5 mg via ORAL
  Filled 2023-04-25 (×4): qty 1

## 2023-04-25 MED ORDER — IPRATROPIUM-ALBUTEROL 0.5-2.5 (3) MG/3ML IN SOLN
3.0000 mL | Freq: Four times a day (QID) | RESPIRATORY_TRACT | Status: DC
  Administered 2023-04-25 – 2023-04-27 (×8): 3 mL via RESPIRATORY_TRACT
  Filled 2023-04-25 (×8): qty 3

## 2023-04-25 MED ORDER — NICOTINE 14 MG/24HR TD PT24
14.0000 mg | MEDICATED_PATCH | Freq: Every day | TRANSDERMAL | Status: DC | PRN
  Administered 2023-04-26 – 2023-04-28 (×2): 14 mg via TRANSDERMAL
  Filled 2023-04-25 (×2): qty 1

## 2023-04-25 MED ORDER — MONTELUKAST SODIUM 10 MG PO TABS
10.0000 mg | ORAL_TABLET | Freq: Every day | ORAL | Status: DC
  Administered 2023-04-26 – 2023-04-28 (×3): 10 mg via ORAL
  Filled 2023-04-25 (×3): qty 1

## 2023-04-25 MED ORDER — IPRATROPIUM-ALBUTEROL 0.5-2.5 (3) MG/3ML IN SOLN: 3.0000 mL | Freq: Once | RESPIRATORY_TRACT | Status: AC

## 2023-04-25 MED ORDER — ENOXAPARIN SODIUM 80 MG/0.8ML IJ SOSY
70.0000 mg | PREFILLED_SYRINGE | INTRAMUSCULAR | Status: DC
  Administered 2023-04-25 – 2023-04-28 (×4): 70 mg via SUBCUTANEOUS
  Filled 2023-04-25 (×4): qty 0.7

## 2023-04-25 MED ORDER — GUAIFENESIN-DM 100-10 MG/5ML PO SYRP: 10.0000 mL | ORAL_SOLUTION | Freq: Four times a day (QID) | ORAL | Status: DC | PRN

## 2023-04-25 MED ORDER — METHYLPREDNISOLONE SODIUM SUCC 125 MG IJ SOLR: 125.0000 mg | Freq: Once | INTRAMUSCULAR | Status: AC

## 2023-04-25 MED ORDER — METOPROLOL SUCCINATE ER 50 MG PO TB24
50.0000 mg | ORAL_TABLET | Freq: Every day | ORAL | Status: DC
  Administered 2023-04-26 – 2023-04-29 (×4): 50 mg via ORAL
  Filled 2023-04-25 (×4): qty 1

## 2023-04-25 MED ORDER — OXYCODONE HCL 5 MG PO TABS
5.0000 mg | ORAL_TABLET | Freq: Four times a day (QID) | ORAL | Status: DC | PRN
  Administered 2023-04-25 – 2023-04-29 (×10): 5 mg via ORAL
  Filled 2023-04-25 (×10): qty 1

## 2023-04-25 MED ORDER — PANTOPRAZOLE SODIUM 40 MG PO TBEC: 40.0000 mg | DELAYED_RELEASE_TABLET | Freq: Every day | ORAL | Status: DC

## 2023-04-25 MED ORDER — ACETAMINOPHEN 325 MG PO TABS
650.0000 mg | ORAL_TABLET | Freq: Four times a day (QID) | ORAL | Status: DC | PRN
  Administered 2023-04-26 – 2023-04-28 (×3): 650 mg via ORAL
  Filled 2023-04-25 (×3): qty 2

## 2023-04-25 MED ORDER — IPRATROPIUM-ALBUTEROL 0.5-2.5 (3) MG/3ML IN SOLN
RESPIRATORY_TRACT | Status: AC
  Administered 2023-04-25: 3 mL via RESPIRATORY_TRACT
  Filled 2023-04-25: qty 6

## 2023-04-25 MED ORDER — MAGNESIUM SULFATE 2 GM/50ML IV SOLN: 2.0000 g | Freq: Once | INTRAVENOUS | Status: AC

## 2023-04-25 NOTE — ED Triage Notes (Signed)
 Pt in via POV, reports worsening Asthma exacerbation throughout the day, no relief w/ home treatments.  Reports recently hospitalized for same.  Audible wheezing and retractions noted in triage.  Roomed at this time and EDP, Siadecki at bedside.

## 2023-04-25 NOTE — Progress Notes (Signed)
 PHARMACIST - PHYSICIAN COMMUNICATION  CONCERNING:  Enoxaparin  (Lovenox ) for DVT Prophylaxis    RECOMMENDATION: Patient was prescribed enoxaprin 40mg  q24 hours for VTE prophylaxis.   Filed Weights   04/25/23 1410  Weight: (!) 145.2 kg (320 lb)    Body mass index is 47.26 kg/m.  Estimated Creatinine Clearance: 138.2 mL/min (by C-G formula based on SCr of 1 mg/dL).   Based on [facility] policy patient is candidate for enoxaparin  0.5mg /kg TBW SQ every 24 hours based on BMI being >30.  DESCRIPTION: Pharmacy has adjusted enoxaparin  dose per [facility] policy.  Patient is now receiving enoxaparin  70 mg every 24 hours    Alice Innocent, PharmD Clinical Pharmacist  04/25/2023 3:38 PM

## 2023-04-25 NOTE — ED Provider Triage Note (Addendum)
 Emergency Medicine Provider Triage Evaluation Note  Justin Reid , a 43 y.o. male  was evaluated in triage.  Pt complains of asthma exasperation, recently admitted, using inhalers etc. without any relief.  Symptoms worsened around 10 AM this morning  Review of Systems  Positive:  Negative:   Physical Exam  BP (!) 161/99   Resp (!) 44  Gen:   Awake, no distress   Resp:  Increased effort, extremely tight, wheezing noted at the upper lung fields MSK:   Moves extremities without difficulty  Other:    Medical Decision Making  Medically screening exam initiated at 2:09 PM.  Appropriate orders placed.  Justin Reid was informed that the remainder of the evaluation will be completed by another provider, this initial triage assessment does not replace that evaluation, and the importance of remaining in the ED until their evaluation is complete.     Delsie Figures, PA-C 04/25/23 1410    Shann Darnel Rufino Coulter, PA-C 04/25/23 1413

## 2023-04-25 NOTE — H&P (Signed)
 History and Physical    Justin Reid ZOX:[id number] DOB: 1980/11/15 DOA: 04/25/2023  PCP: Pcp, No (Confirm with patient/family/NH records and if not entered, this has to be entered at [facility] point of entry) Patient coming from: Home  I have personally briefly reviewed patient's old medical records in [facility]  Chief Complaint: Cough, wheezing, SOB  HPI: Justin Reid is a 43 y.o. male with medical history significant of moderate asthma, tobacco abuse, HTN, vocal cord dysfunction, OSA on CPAP, morbid obesity, presented with increasing.  Patient reported 1 day onset of dry cough, wheezing and increasing exertional dyspnea.  Unable to sleep last night because of shortness of breath and came to ED.  Denied any chest pain no fever chills no peripheral edema.  Claimed that " has been trying to cut down smoking"  ED Course: Afebrile, nontachycardic blood pressure 130/90, tachypneic breathing rate 35-45, chest x-ray showed no acute infiltrates and VBG showed no acute CO2 retention.  Patient was placed on BiPAP for relieving breathing effort.  After IV Solu-Medrol  and magnesium  remains tachypneic and remained on BiPAP.  Review of Systems: As per HPI otherwise 14 point review of systems negative.   Past Medical History:  Diagnosis Date   2019-nCoV vaccination declined 11/10/2020   Arthritis    Asthma    Hypertension    Morbid obesity (HCC)    Shingles    Tobacco use     History reviewed. No pertinent surgical history.   reports that he has been smoking cigarettes. He has a 2.5 pack-year smoking history. He has never used smokeless tobacco. He reports current alcohol use. He reports that he does not use drugs.  No Known Allergies  Family History  Problem Relation Age of Onset   Hypertension Father      Prior to Admission medications   Medication Sig Start Date End Date Taking? Authorizing Provider  acetaminophen  (TYLENOL ) 325 MG tablet Take 2 tablets (650 mg total) by  mouth every 6 (six) hours as needed for mild pain (pain score 1-3) or fever. 04/06/23   Alexander, Natalie, DO  albuterol  (VENTOLIN  HFA) 108 (90 Base) MCG/ACT inhaler Inhale 2 puffs into the lungs every 4 (four) hours as needed for wheezing or shortness of breath. 04/22/23   Tiajuana Fluke, MD  amLODipine  (NORVASC ) 5 MG tablet Take 1 tablet (5 mg total) by mouth daily. 04/22/23 06/21/23  Tiajuana Fluke, MD  Dextromethorphan -guaiFENesin  20-200 MG/20ML LIQD Take 10 mLs by mouth every 6 (six) hours as needed. 04/06/23   Alexander, Natalie, DO  fluticasone -salmeterol (WIXELA INHUB ) 250-50 MCG/ACT AEPB Inhale 1 puff into the lungs in the morning and at bedtime. 04/22/23 06/21/23  Tiajuana Fluke, MD  loratadine  (CLARITIN ) 10 MG tablet Take 1 tablet (10 mg total) by mouth daily. 04/06/23   Alexander, Natalie, DO  metoprolol  succinate (TOPROL -XL) 50 MG 24 hr tablet Take 1 tablet (50 mg total) by mouth daily. Take with or immediately following a meal. 04/22/23 06/21/23  Sreenath, Daymon Evans, MD  montelukast  (SINGULAIR ) 10 MG tablet Take 1 tablet (10 mg total) by mouth daily. 04/06/23   Alexander, Natalie, DO  nicotine  (NICODERM CQ  - DOSED IN MG/24 HOURS) 14 mg/24hr patch Place 1 patch (14 mg total) onto the skin daily as needed (nicotine  craving). 04/22/23   Tiajuana Fluke, MD  oxyCODONE  (OXY IR/ROXICODONE ) 5 MG immediate release tablet Take 1 tablet (5 mg total) by mouth every 6 (six) hours as needed for moderate pain (pain score 4-6)  or severe pain (pain score 7-10). 04/22/23   Tiajuana Fluke, MD  pantoprazole  (PROTONIX ) 40 MG tablet Take 1 tablet (40 mg total) by mouth daily. 04/22/23 06/21/23  Tiajuana Fluke, MD  predniSONE  (DELTASONE ) 20 MG tablet Take 2 tablets (40 mg total) by mouth daily with breakfast for 5 days. 04/22/23 04/27/23  Tiajuana Fluke, MD    Physical Exam: Vitals:   04/25/23 1410 04/25/23 1422 04/25/23 1430 04/25/23 1500  BP:   (!) 136/96 (!) 127/98  Pulse:   93 84  Resp:    (!) 54 (!) 21  Temp:      TempSrc:      SpO2:  93% 100% 99%  Weight: (!) 145.2 kg     Height: 5\' 9"  (1.753 m)       Constitutional: NAD, calm, comfortable Vitals:   04/25/23 1410 04/25/23 1422 04/25/23 1430 04/25/23 1500  BP:   (!) 136/96 (!) 127/98  Pulse:   93 84  Resp:   (!) 54 (!) 21  Temp:      TempSrc:      SpO2:  93% 100% 99%  Weight: (!) 145.2 kg     Height: 5\' 9"  (1.753 m)      Eyes: PERRL, lids and conjunctivae normal ENMT: Mucous membranes are moist. Posterior pharynx clear of any exudate or lesions.Normal dentition.  Neck: normal, supple, no masses, no thyromegaly Respiratory: Diminished breathing sound bilaterally, diffused wheezing, no crackles.  Increasing respiratory effort. No accessory muscle use.  Cardiovascular: Regular rate and rhythm, no murmurs / rubs / gallops. No extremity edema. 2+ pedal pulses. No carotid bruits.  Abdomen: no tenderness, no masses palpated. No hepatosplenomegaly. Bowel sounds positive.  Musculoskeletal: no clubbing / cyanosis. No joint deformity upper and lower extremities. Good ROM, no contractures. Normal muscle tone.  Skin: no rashes, lesions, ulcers. No induration Neurologic: CN 2-12 grossly intact. Sensation intact, DTR normal. Strength 5/5 in all 4.  Psychiatric: Normal judgment and insight. Alert and oriented x 3. Normal mood.     Labs on Admission: I have personally reviewed following labs and imaging studies  CBC: Recent Labs  Lab 04/19/23 1515 04/20/23 0353 04/25/23 1422  WBC 7.2 8.6 10.4  NEUTROABS  --   --  6.5  HGB 13.8 13.2 13.3  HCT 43.2 41.4 40.8  MCV 87.4 86.8 84.8  PLT 400 412* 407*   Basic Metabolic Panel: Recent Labs  Lab 04/19/23 1515 04/20/23 0353 04/25/23 1422  NA 140 135 135  K 4.0 4.8 3.7  CL 105 102 99  CO2 25 23 26   GLUCOSE 130* 175* 90  BUN 18 18 24*  CREATININE 0.87 0.95 1.00  CALCIUM  9.2 9.1 8.9  MG 2.5*  --   --    GFR: Estimated Creatinine Clearance: 138.2 mL/min (by C-G  formula based on SCr of 1 mg/dL). Liver Function Tests: No results for input(s): "AST", "ALT", "ALKPHOS", "BILITOT", "PROT", "ALBUMIN" in the last 168 hours. No results for input(s): "LIPASE", "AMYLASE" in the last 168 hours. No results for input(s): "AMMONIA" in the last 168 hours. Coagulation Profile: No results for input(s): "INR", "PROTIME" in the last 168 hours. Cardiac Enzymes: No results for input(s): "CKTOTAL", "CKMB", "CKMBINDEX", "TROPONINI" in the last 168 hours. BNP (last 3 results) No results for input(s): "PROBNP" in the last 8760 hours. HbA1C: No results for input(s): "HGBA1C" in the last 72 hours. CBG: No results for input(s): "GLUCAP" in the last 168 hours. Lipid Profile: No results for input(s): "CHOL", "  HDL", "LDLCALC", "TRIG", "CHOLHDL", "LDLDIRECT" in the last 72 hours. Thyroid  Function Tests: No results for input(s): "TSH", "T4TOTAL", "FREET4", "T3FREE", "THYROIDAB" in the last 72 hours. Anemia Panel: No results for input(s): "VITAMINB12", "FOLATE", "FERRITIN", "TIBC", "IRON", "RETICCTPCT" in the last 72 hours. Urine analysis: No results found for: "COLORURINE", "APPEARANCEUR", "LABSPEC", "PHURINE", "GLUCOSEU", "HGBUR", "BILIRUBINUR", "KETONESUR", "PROTEINUR", "UROBILINOGEN", "NITRITE", "LEUKOCYTESUR"  Radiological Exams on Admission: No results found.  EKG: Independently reviewed.  Sinus rhythm, no acute ST changes.  Assessment/Plan Principal Problem:   Asthma Active Problems:   Acute severe exacerbation of severe persistent asthma   Acute respiratory failure with hypoxia (HCC)  (please populate well all problems here in Problem List. (For example, if patient is on BP meds at home and you resume or decide to hold them, it is a problem that needs to be her. Same for CAD, COPD, HLD and so on)  Acute severe exacerbation of severe persistent asthma Impending status asthmaticus Acute hypoxic respiratory failure - Continue BiPAP support - Trigger likely,  clinically suspect continuous cigarette smoking, breathing status complicated by body habitus.  Clinically suspect both obstructive and restrictive lung disease.  Recommend outpatient follow-up with pulmonary for a formal lung function test - IV steroid - ICS and LABA - DuoNeb every 6 hours and as needed albuterol  - Peak flow  Morbid obesity -BMI= 47 - Given the comorbidity of OSA, recommend outpatient bariatric evaluation versus Ozempic therapy  Cigarette smoking - Cessation education performed at bedside  DVT prophylaxis: Lovenox  Code Status: Full Family Communication: None at bedside Disposition Plan: Expect less than 2 midnight hospital stay Consults called: None Admission status: PCU observation   [place] MD [facility] Pager [phone]  04/25/2023, 3:50 PM

## 2023-04-25 NOTE — ED Provider Notes (Signed)
 [facility] Provider Note    Event Date/Time   First MD Initiated Contact with Patient 04/25/23 1423     (approximate)   History   Asthma and Cough   HPI  Justin Reid is a 43 y.o. male with history of asthma, hypertension, and morbid obesity who presents with shortness of breath, acute onset this morning, persistent course, not relieved by his home medications.  The patient reports some chest pain as well.  He denies fever.  I reviewed the past medical record.  The patient was just admitted to the hospital service from 4/15 through 4/18 for an acute asthma exacerbation.   Physical Exam   Triage Vital Signs: ED Triage Vitals  Encounter Vitals Group     BP 04/25/23 1407 (!) 161/99     Systolic BP Percentile --      Diastolic BP Percentile --      Pulse Rate 04/25/23 1407 98     Resp 04/25/23 1407 (!) 44     Temp 04/25/23 1407 98.4 F (36.9 C)     Temp Source 04/25/23 1407 Oral     SpO2 04/25/23 1407 97 %     Weight 04/25/23 1410 (!) 320 lb (145.2 kg)     Height 04/25/23 1410 5\' 9"  (1.753 m)     Head Circumference --      Peak Flow --      Pain Score 04/25/23 1410 8     Pain Loc --      Pain Education --      Exclude from Growth Chart --     Most recent vital signs: Vitals:   04/25/23 1430 04/25/23 1500  BP: (!) 136/96 (!) 127/98  Pulse: 93 84  Resp: (!) 54 (!) 21  Temp:    SpO2: 100% 99%     General: Alert, uncomfortable appearing. CV:  Good peripheral perfusion.  Resp:  Respiratory distress.  Diffuse wheezing bilaterally with poor air entry. Abd:  No distention.  Other:  No peripheral edema.   ED Results / Procedures / Treatments   Labs (all labs ordered are listed, but only abnormal results are displayed) Labs Reviewed  BASIC METABOLIC PANEL WITH GFR - Abnormal; Notable for the following components:      Result Value   BUN 24 (*)    All other components within normal limits  CBC WITH DIFFERENTIAL/PLATELET - Abnormal;  Notable for the following components:   Platelets 407 (*)    All other components within normal limits  BLOOD GAS, VENOUS - Abnormal; Notable for the following components:   Bicarbonate 28.5 (*)    Acid-Base Excess 3.4 (*)    All other components within normal limits  RESP PANEL BY RT-PCR (RSV, FLU A&B, COVID)  [id number]  TROPONIN I (HIGH SENSITIVITY)     EKG  ED ECG REPORT I, Lind Repine, the attending physician, personally viewed and interpreted this ECG.  Date: 04/25/2023 EKG Time: 1406 Rate: 96 Rhythm: normal sinus rhythm QRS Axis: normal Intervals: normal ST/T Wave abnormalities: Nonspecific T wave abnormality Narrative Interpretation: no evidence of acute ischemia    RADIOLOGY  Chest x-ray: I independently viewed and interpreted the images; there is bibasilar atelectasis with no focal consolidation   PROCEDURES:  Critical Care performed: Yes, see critical care procedure note(s)  .Critical Care  Performed by: Lind Repine, MD Authorized by: Lind Repine, MD   Critical care provider statement:    Critical care time (minutes):  30  Critical care time was exclusive of:  Separately billable procedures and treating other patients   Critical care was necessary to treat or prevent imminent or life-threatening deterioration of the following conditions:  Respiratory failure   Critical care was time spent personally by me on the following activities:  Development of treatment plan with patient or surrogate, discussions with consultants, evaluation of patient's response to treatment, examination of patient, ordering and review of laboratory studies, ordering and review of radiographic studies, ordering and performing treatments and interventions, pulse oximetry, re-evaluation of patient's condition, review of old charts and obtaining history from patient or surrogate    MEDICATIONS ORDERED IN ED: Medications  methylPREDNISolone  sodium succinate  (SOLU-MEDROL ) 125 mg/2 mL injection 80 mg (has no administration in time range)  ipratropium-albuterol  (DUONEB) 0.5-2.5 (3) MG/3ML nebulizer solution 3 mL (3 mLs Nebulization Given 04/25/23 1420)  ipratropium-albuterol  (DUONEB) 0.5-2.5 (3) MG/3ML nebulizer solution 3 mL (3 mLs Nebulization Given 04/25/23 1420)  methylPREDNISolone  sodium succinate (SOLU-MEDROL ) 125 mg/2 mL injection 125 mg (125 mg Intravenous Given 04/25/23 1427)  magnesium  sulfate IVPB 2 g 50 mL (2 g Intravenous New Bag/Given 04/25/23 1427)     IMPRESSION / MDM / ASSESSMENT AND PLAN / ED COURSE  I reviewed the triage vital signs and the nursing notes.  43 year old male with PMH as noted above presents with acute onset of shortness of breath earlier today.  On exam, the patient is in acute respiratory distress, tachypneic with a respiratory rate in the 40s.  O2 saturation is in the high 90s.  Lung exam demonstrates diffuse wheezing but poor air entry.  There is no peripheral edema.  Differential diagnosis includes, but is not limited to, asthma exacerbation, COVID, influenza, or other viral syndrome, acute bronchitis, pneumonia.  I have a lower suspicion for cardiac etiology.  We will obtain chest x-ray, respiratory panel, basic labs, cardiac enzymes, and give DuoNebs, Solu-Medrol , magnesium ; I have also ordered BiPAP due to the work of breathing.  Patient's presentation is most consistent with acute presentation with potential threat to life or bodily function.  The patient is on the cardiac monitor to evaluate for evidence of arrhythmia and/or significant heart rate changes.   ----------------------------------------- 3:26 PM on 04/25/2023 -----------------------------------------  Lab workup is reassuring.  Respiratory panel is negative.  Troponin is negative.  VBG shows no hypercapnia.  CBC shows no leukocytosis.  On reassessment, the patient is still quite tachypneic with a rate as high as 50 with increased work of  breathing, although he is now able to speak in short to medium sentences.  He had actually just taken off the BiPAP because he found it uncomfortable on his job.  His O2 saturation even off the BiPAP was still 99%.  I placed the BiPAP back on him.  I consulted Dr. Jeane Miguel from the hospitalist service; based on our discussion he agrees to evaluate the patient for admission.  At this time the patient is not having acute respiratory failure although based on his continued work of breathing I also informed the oncoming ED physician Dr. Azalee Bolds about him in order to monitor his respiratory status.  If he is persistently very tachypneic or uncomfortable there is a chance he may need a higher level of care.   FINAL CLINICAL IMPRESSION(S) / ED DIAGNOSES   Final diagnoses:  Exacerbation of asthma, unspecified asthma severity, unspecified whether persistent     Rx / DC Orders   ED Discharge Orders     None  Note:  This document was prepared using Dragon voice recognition software and may include unintentional dictation errors.    Lind Repine, MD 04/25/23 [phone]

## 2023-04-25 NOTE — ED Notes (Signed)
 This RN received report from Lena Qualia RN and performed care handoff. This RN introduced self to pt. Call light in reach, bed wheels locked, side rail raised, pt updated on plan of care. Rounding completed.

## 2023-04-26 ENCOUNTER — Inpatient Hospital Stay: Payer: MEDICAID

## 2023-04-26 DIAGNOSIS — J45901 Unspecified asthma with (acute) exacerbation: Secondary | ICD-10-CM | POA: Insufficient documentation

## 2023-04-26 DIAGNOSIS — J9601 Acute respiratory failure with hypoxia: Secondary | ICD-10-CM

## 2023-04-26 MED ORDER — IOHEXOL 300 MG/ML  SOLN
30.0000 mL | Freq: Once | INTRAMUSCULAR | Status: AC | PRN
  Administered 2023-04-26: 30 mL via ORAL

## 2023-04-26 MED ORDER — PANTOPRAZOLE SODIUM 40 MG PO TBEC
40.0000 mg | DELAYED_RELEASE_TABLET | Freq: Two times a day (BID) | ORAL | Status: DC
  Administered 2023-04-26 – 2023-04-29 (×7): 40 mg via ORAL
  Filled 2023-04-26 (×7): qty 1

## 2023-04-26 MED ORDER — IOHEXOL 350 MG/ML SOLN
100.0000 mL | Freq: Once | INTRAVENOUS | Status: AC | PRN
Start: 2023-04-26 — End: 2023-04-26
  Administered 2023-04-26: 100 mL via INTRAVENOUS

## 2023-04-26 MED ORDER — FUROSEMIDE 10 MG/ML IJ SOLN
40.0000 mg | Freq: Two times a day (BID) | INTRAMUSCULAR | Status: DC
  Administered 2023-04-26 – 2023-04-28 (×5): 40 mg via INTRAVENOUS
  Filled 2023-04-26 (×5): qty 4

## 2023-04-26 NOTE — ED Notes (Signed)
 Sleeping resting on bipap. Tolerating well. Does not use at home. Pt alert, NAD, calm, interactive, resps e/u, speaking in clear complete sentences. LS decreased with wheezing. Endorses sob, wheezing, chronic back pain, and requests nicotine  patch. Mask removed for meds and neb. Family at [facility]. Phone given to order breakfast.

## 2023-04-26 NOTE — ED Notes (Signed)
 Up to b/r, became dizzy, sob, DOE. Assisted back to stretcher, wheezing.

## 2023-04-26 NOTE — ED Notes (Signed)
 Patient provided with a ginger ale at bedside.

## 2023-04-26 NOTE — Progress Notes (Signed)
 Progress Note   Patient: Justin Reid:[id number] DOB: 1980/07/17 DOA: 04/25/2023     0 DOS: the patient was seen and examined on 04/26/2023   Brief hospital course: HPI on admission - " Justin Reid is a 43 y.o. male with medical history significant of moderate asthma, tobacco abuse, HTN, vocal cord dysfunction, OSA on CPAP, morbid obesity, presented with increasing.   Patient reported 1 day onset of dry cough, wheezing and increasing exertional dyspnea.  Unable to sleep last night because of shortness of breath and came to ED.  Denied any chest pain no fever chills no peripheral edema.  Claimed that " has been trying to cut down smoking"   ED Course: Afebrile, nontachycardic blood pressure 130/90, tachypneic breathing rate 35-45, chest x-ray showed no acute infiltrates and VBG showed no acute CO2 retention.  Patient was placed on BiPAP for relieving breathing effort.  After IV Solu-Medrol  and magnesium  remains tachypneic and remained on BiPAP."  Patient admitted for recurrent asthma exacerbation.  Further hospital course and management as outlined below.     Assessment and Plan:  Acute severe exacerbation of severe persistent asthma Impending status asthmaticus - on admission, now stabilizing Acute hypoxic respiratory failure with hypoxia Vocal cord dysfunction  Suspect triggered by ongoing cigarette smoking, not having inhalers due to cost, breathing status also complicated by body habitus.  Clinically suspect both obstructive and restrictive lung disease.   --Weaned off BiPAP today - continue PRN --Supplement O2, maintain sats > 90% --IV steroids -- ICS and LABA --DuoNeb every 6 hours and as needed albuterol  --Peak flow --See prior Pulmonology recommendations from recent admissions, consult if not improving as expected  Bilateral knee pain and thigh swelling No injury, no trauma per patient --U/S dopplers to check for DVT's -- negative for DVT's but shows fluid  collections lateral to both knees (right up to 7.6 cm, left up to 12.6 cm), broad differential.  Not seen on prior study just in Feb. --CT vs MRI when respiratory status has improved  RLQ abdominal pain  --CT abdomen/pelvis   Morbid obesity Body mass index is 47.26 kg/m.' Complicates overall care and prognosis.  Recommend lifestyle modifications including physical activity and diet for weight loss and overall long-term health. Consider bariatric referral.  Consider GLP-1 therapy, but currently cost-prohibitive.   Cigarette smoking Counseled each admission about importance of cessation, especially given the severity of his asthma.      Subjective: Pt seen in ED holding for a bed, wife at bedside.  Pt still on bipap, states they tried him off of it short while ago but had to be put back on.  He reports pain in bilateral knees (above the knees), along with swelling. Denies any falls, injuries or trauma.  RN later sent chat reported pt having RLQ pain and tenderness; pt stated he had not previously mentioned to doctors.  Physical Exam: Vitals:   04/26/23 1315 04/26/23 1330 04/26/23 1430 04/26/23 1456  BP: (!) 132/101 (!) 129/101 (!) 128/95 (!) 133/97  Pulse:   81 76  Resp: 16 (!) 21 (!) 24 (!) 27  Temp:    98 F (36.7 C)  TempSrc:    Oral  SpO2:   100% 94%  Weight:      Height:      General exam: awake, alert, no acute distress, morbidly obese HEENT: bipap mask in place, hearing grossly normal  Respiratory system: severely diminished breath sounds, unable to hear wheezing due to poor air movement, increased  respiratory effort with accessory muscle use, on bipap. Cardiovascular system: normal S1/S2, tachycardic, regular rhythm Gastrointestinal system: obese abdomen, nondistended Central nervous system: A&O x3. no gross focal neurologic deficits, normal speech Extremities: edema of bilateral thighs from proximal to knees Skin: dry, intact, normal temperature Psychiatry: normal mood,  congruent affect, judgement and insight appear normal   Data Reviewed:  Notable labs --  BUN 24 Platelets 407  Family Communication: wife at bedside  Disposition: Status is: Inpatient Remains inpatient appropriate because: severity of illness as above  Planned Discharge Destination: Home    Time spent: 52 minutes including time at bedside and coordination of care   Author: Montey Apa, DO 04/26/2023 8:04 PM  For on call review [email or web address].

## 2023-04-27 DIAGNOSIS — J4541 Moderate persistent asthma with (acute) exacerbation: Secondary | ICD-10-CM

## 2023-04-27 MED ORDER — IPRATROPIUM-ALBUTEROL 0.5-2.5 (3) MG/3ML IN SOLN
3.0000 mL | Freq: Three times a day (TID) | RESPIRATORY_TRACT | Status: DC
  Administered 2023-04-27 – 2023-04-29 (×6): 3 mL via RESPIRATORY_TRACT
  Filled 2023-04-27 (×6): qty 3

## 2023-04-27 NOTE — Progress Notes (Signed)
 Progress Note   Patient: Justin Reid WGN:[id number] DOB: 11/03/1980 DOA: 04/25/2023     1 DOS: the patient was seen and examined on 04/27/2023   Brief hospital course: 42yo with h/o moderate asthma with ongoing tobacco use, VCD, OSA on CPAP, and morbid obesity who presented on 4/21 with SOB and was found to have asthma exacerbation requiring BIPAP intermittently.  Also reported B knee pain and swelling, found to have B lateral knee effusions.  Assessment and Plan:  Acute severe exacerbation of severe persistent asthma Suspect triggered by ongoing cigarette smoking, not having inhalers due to cost, breathing status also complicated by body habitus Weaned off BiPAP today - continue PRN CPAP Supplement O2, maintain sats > 90% IV steroids -> PO prednisone  ICS and LABA DuoNeb every 6 hours and as needed albuterol  Peak flow See prior Pulmonology recommendations from recent admissions, consult if not improving as expected   Bilateral knee pain and thigh swelling No injury, no trauma per patient U/S dopplers to check for DVT's -- negative for DVT's but shows fluid collections lateral to both knees (right up to 7.6 cm, left up to 12.6 cm), broad differential.  Not seen on prior study just in Feb. Outpatient orthopedics follow up   RLQ abdominal pain  CT abdomen/pelvis negative   Morbid obesity Body mass index is 47.26 kg/m.' Complicates overall care and prognosis Recommend lifestyle modifications including physical activity and diet for weight loss and overall long-term health Consider bariatric referral Consider GLP-1 therapy, but currently cost-prohibitive   Cigarette smoking Counseled each admission about importance of cessation, especially given the severity of his asthma  Presumed OSA Wife reports snoring and apnea during sleep Has habitus suggestive of OSA Breathing improved with CPAP and he slept better here Needs outpatient sleep  study     Consultants: None  Procedures: None  Antibiotics: None  30 Day Unplanned Readmission Risk Score    Flowsheet Row ED to Hosp-Admission (Current) from 04/25/2023 in [facility] (1A)  30 Day Unplanned Readmission Risk Score (%) 43.22 Filed at 04/27/2023 0801       This score is the patient's risk of an unplanned readmission within 30 days of being discharged (0 -100%). The score is based on dignosis, age, lab data, medications, orders, and past utilization.   Low:  0-14.9   Medium: 15-21.9   High: 22-29.9   Extreme: 30 and above           Subjective: Wearing CPAP during my visit.  Reports some improvement in breathing.  Chronic back and knee pain.  He has been repeatedly leaving the floor during the afternoon and so discharged was considered.  Given his ongoing wheezing and SOB, will continue to monitor overnight.   Objective: Vitals:   04/27/23 1443 04/27/23 1531  BP:  (!) 131/91  Pulse:  81  Resp:  18  Temp:  98.4 F (36.9 C)  SpO2: 96% 98%    Intake/Output Summary (Last 24 hours) at 04/27/2023 1710 Last data filed at 04/27/2023 1043 Gross per 24 hour  Intake 240 ml  Output --  Net 240 ml   Filed Weights   04/25/23 1410  Weight: (!) 145.2 kg    Exam:  General:  Appears calm and comfortable and is in NAD, on CPAP Eyes:  EOMI, normal lids, iris ENT:  grossly normal hearing, lips & tongue; CPAP in place Neck:  no LAD, masses or thyromegaly Cardiovascular:  RRR, no m/r/g. No LE edema.  Respiratory:  CTA bilaterally with no wheezes/rales/rhonchi.  Normal respiratory effort. Abdomen:  soft, NT, ND, NABS Back:   normal alignment, no CVAT Skin:  no rash or induration seen on limited exam Musculoskeletal:  grossly normal tone BUE/BLE; B lateral-superior knee effusions Psychiatric:  grossly normal mood and affect, speech limited by BIPAP Neurologic:  CN 2-12 grossly intact, moves all extremities in coordinated  fashion   Data Reviewed: I have reviewed the patient's lab results since admission.  Pertinent labs for today include:   None     Family Communication: Significant other was present throughout evaluation  Disposition: Status is: Inpatient Remains inpatient appropriate because: ongoing monitoring     Time spent: 50 minutes  Unresulted Labs (From admission, onward)     Start     Ordered   04/28/23 0500  CBC with Differential/Platelet  Tomorrow morning,   R        04/27/23 1710   04/28/23 0500  Basic metabolic panel with GFR  Tomorrow morning,   R        04/27/23 1710   04/27/23 1706  Urine Drug Screen, Qualitative ([facility] only)  Once,   R        04/27/23 1705             Author: Lorita Rosa, MD 04/27/2023 5:10 PM  For on call review [email or web address].

## 2023-04-27 NOTE — Evaluation (Signed)
 SATURATION QUALIFICATIONS:   Patient Saturations on Room Air at Rest = 97%  Patient Saturations on Room Air while Ambulating = 94%  Patient Saturations on 2 Liters of oxygen  while Ambulating = 98%

## 2023-04-27 NOTE — Evaluation (Signed)
 Physical Therapy Evaluation Patient Details Name: Justin Reid MRN: [id number] DOB: 1980-06-13 Today's Date: 04/27/2023  History of Present Illness  43 y/o male presented to ED on for SOB. was just discharged home 4/18 with same. Admitted for acute hypoxic respiratory failure and asthma exacerbation. PMH: HTN, OSA, tobacco abuse, morbid obesity  Clinical Impression  Patient received in bed on Bipap. He is agreeable to PT assessment. He is independent with all mobility. Ambulated 150 feet without AD, supervision. O2 saturations > 96% throughout session on room air. Patient reports 10/10 B knee pain and back pain that is chronic. He will benefit from orthopedic consult. Patient will continue to benefit from mobility while here. No skilled PT needs at this time as he is independent with mobility other than pain.        If plan is discharge home, recommend the following:  N/a   Can travel by private vehicle    yes    Equipment Recommendations None recommended by PT  Recommendations for Other Services       Functional Status Assessment Patient has not had a recent decline in their functional status     Precautions / Restrictions Precautions Precautions: None Restrictions Weight Bearing Restrictions Per Provider Order: No      Mobility  Bed Mobility Overal bed mobility: Independent                  Transfers Overall transfer level: Independent                      Ambulation/Gait Ambulation/Gait assistance: Independent Gait Distance (Feet): 150 Feet Assistive device: None Gait Pattern/deviations: WFL(Within Functional Limits) Gait velocity: mildy decreased     General Gait Details: ambulated around nursing station independently. Reports 10/10 pain in B knees and back. Patient has some wheezing and SOB, O2 sats 97 percent.  Stairs            Wheelchair Mobility     Tilt Bed    Modified Rankin (Stroke Patients Only)       Balance  Overall balance assessment: Independent                                           Pertinent Vitals/Pain Pain Assessment Pain Assessment: 0-10 Pain Score: 10-Worst pain ever Pain Location: B knees, back Pain Descriptors / Indicators: Discomfort, Sore Pain Intervention(s): Monitored during session, Repositioned    Home Living Family/patient expects to be discharged to:: Private residence Living Arrangements: Spouse/significant other Available Help at Discharge: Family Type of Home: House Home Access: Stairs to enter Entrance Stairs-Rails: None Entrance Stairs-Number of Steps: 2   Home Layout: One level Home Equipment: Agricultural consultant (2 wheels);Cane - single point      Prior Function Prior Level of Function : Independent/Modified Independent             Mobility Comments: uses RW or cane as needed due to B knee pain and back pain ADLs Comments: mod I     Extremity/Trunk Assessment   Upper Extremity Assessment Upper Extremity Assessment: Overall WFL for tasks assessed    Lower Extremity Assessment Lower Extremity Assessment: RLE deficits/detail;LLE deficits/detail RLE Deficits / Details: painful B kness with weight bearing    Cervical / Trunk Assessment Cervical / Trunk Assessment: Normal  Communication   Communication Communication: No apparent difficulties    Cognition Arousal:  Alert Behavior During Therapy: WFL for tasks assessed/performed   PT - Cognitive impairments: No apparent impairments                         Following commands: Intact       Cueing Cueing Techniques: Verbal cues     General Comments      Exercises     Assessment/Plan    PT Assessment Patient needs continued PT services;Patient does not need any further PT services  PT Problem List Pain;Decreased activity tolerance;Decreased mobility       PT Treatment Interventions      PT Goals (Current goals can be found in the Care Plan section)   Acute Rehab PT Goals Patient Stated Goal: improve pain, breathing PT Goal Formulation: With patient Time For Goal Achievement: 04/30/23 Potential to Achieve Goals: Fair    Frequency       Co-evaluation               AM-PAC PT "6 Clicks" Mobility  Outcome Measure Help needed turning from your back to your side while in a flat bed without using bedrails?: None Help needed moving from lying on your back to sitting on the side of a flat bed without using bedrails?: None Help needed moving to and from a bed to a chair (including a wheelchair)?: None Help needed standing up from a chair using your arms (e.g., wheelchair or bedside chair)?: None Help needed to walk in hospital room?: None Help needed climbing 3-5 steps with a railing? : None 6 Click Score: 24    End of Session Equipment Utilized During Treatment: Oxygen  Activity Tolerance: Patient tolerated treatment well;Patient limited by pain Patient left: in bed;with call bell/phone within reach;with family/visitor present Nurse Communication: Mobility status PT Visit Diagnosis: Pain Pain - Right/Left:  (bilateral) Pain - part of body: Knee    Time: 1610-9604 PT Time Calculation (min) (ACUTE ONLY): 18 min   Charges:   PT Evaluation $PT Eval Low Complexity: 1 Low PT Treatments $Gait Training: 8-22 mins PT General Charges $$ ACUTE PT VISIT: 1 Visit         Carl Bleecker, PT, GCS 04/27/23,9:46 AM

## 2023-04-27 NOTE — Hospital Course (Addendum)
 42yo with h/o moderate asthma with ongoing tobacco use, VCD, OSA on CPAP, and morbid obesity who presented on 4/21 with SOB and was found to have asthma exacerbation requiring BIPAP intermittently.  Also reported B knee pain and swelling, found to have B lateral knee effusions.

## 2023-04-28 LAB — CBC WITH DIFFERENTIAL/PLATELET
Abs Immature Granulocytes: 0.11 10*3/uL — ABNORMAL HIGH (ref 0.00–0.07)
Basophils Absolute: 0 10*3/uL (ref 0.0–0.1)
Basophils Relative: 0 %
Eosinophils Absolute: 0 10*3/uL (ref 0.0–0.5)
Eosinophils Relative: 0 %
HCT: 39.6 % (ref 39.0–52.0)
Hemoglobin: 12.6 g/dL — ABNORMAL LOW (ref 13.0–17.0)
Immature Granulocytes: 1 %
Lymphocytes Relative: 8 %
Lymphs Abs: 0.9 10*3/uL (ref 0.7–4.0)
MCH: 27.2 pg (ref 26.0–34.0)
MCHC: 31.8 g/dL (ref 30.0–36.0)
MCV: 85.3 fL (ref 80.0–100.0)
Monocytes Absolute: 0.2 10*3/uL (ref 0.1–1.0)
Monocytes Relative: 1 %
Neutro Abs: 10.6 10*3/uL — ABNORMAL HIGH (ref 1.7–7.7)
Neutrophils Relative %: 90 %
Platelets: 403 10*3/uL — ABNORMAL HIGH (ref 150–400)
RBC: 4.64 MIL/uL (ref 4.22–5.81)
RDW: 15.4 % (ref 11.5–15.5)
WBC: 11.8 10*3/uL — ABNORMAL HIGH (ref 4.0–10.5)
nRBC: 0 % (ref 0.0–0.2)

## 2023-04-28 LAB — BASIC METABOLIC PANEL WITH GFR
Anion gap: 10 (ref 5–15)
BUN: 22 mg/dL — ABNORMAL HIGH (ref 6–20)
CO2: 27 mmol/L (ref 22–32)
Calcium: 9.2 mg/dL (ref 8.9–10.3)
Chloride: 99 mmol/L (ref 98–111)
Creatinine, Ser: 0.99 mg/dL (ref 0.61–1.24)
GFR, Estimated: >60 mL/min (ref >60)
Glucose, Bld: 165 mg/dL — ABNORMAL HIGH (ref 70–99)
Potassium: 4.4 mmol/L (ref 3.5–5.1)
Sodium: 136 mmol/L (ref 135–145)

## 2023-04-28 LAB — URINE DRUG SCREEN, QUALITATIVE (ARMC ONLY)
Amphetamines, Ur Screen: NOT DETECTED
Barbiturates, Ur Screen: NOT DETECTED
Benzodiazepine, Ur Scrn: NOT DETECTED
Cannabinoid 50 Ng, Ur ~~LOC~~: NOT DETECTED
Cocaine Metabolite,Ur ~~LOC~~: NOT DETECTED
MDMA (Ecstasy)Ur Screen: NOT DETECTED
Methadone Scn, Ur: NOT DETECTED
Opiate, Ur Screen: NOT DETECTED
Phencyclidine (PCP) Ur S: NOT DETECTED
Tricyclic, Ur Screen: NOT DETECTED

## 2023-04-28 MED ORDER — PREDNISONE 20 MG PO TABS
40.0000 mg | ORAL_TABLET | Freq: Every day | ORAL | Status: DC
  Administered 2023-04-29: 40 mg via ORAL
  Filled 2023-04-28: qty 2

## 2023-04-28 MED ORDER — FUROSEMIDE 40 MG PO TABS
40.0000 mg | ORAL_TABLET | Freq: Every day | ORAL | Status: DC
Start: 2023-04-28 — End: 2023-04-29
  Administered 2023-04-28 – 2023-04-29 (×2): 40 mg via ORAL
  Filled 2023-04-28 (×2): qty 1

## 2023-04-28 NOTE — Plan of Care (Signed)

## 2023-04-28 NOTE — Consult Note (Signed)
 PULMONOLOGY         Date: 04/28/2023,   MRN# [id number] DAIVIK OVERLEY 1980/07/19     AdmissionWeight: (!) 145.2 kg                 CurrentWeight: (!) 145.2 kg  Referring provider: Dr Murrel Arnt    CHIEF COMPLAINT:   Acute exacerbation of asthma   HISTORY OF PRESENT ILLNESS   This is a 43 yo M with hx of smoking, substance abuse, shingles, obesity, vocal cord dysfunction, OSA and moderate persistent atopic asthma.  He came in with complaints of worsening cough, wheezing chest tightness and dyspnea at rest. He was tachypneic on admission and required BIPAP support.  He received magnesium  infusion and solumedrol with nebulizer therapy. Ive seen him in the past for similar presentation of AE of asthma and we were supposed to follow up with him in outpatient but he was hospitalized again. His blood gas showed respiratory  alkalosis due to hyperventilation. PCCM consultation for further evaluation and management due to recurrent admission with acute asthma exacerbation.     PAST MEDICAL HISTORY   Past Medical History:  Diagnosis Date   2019-nCoV vaccination declined 11/10/2020   Arthritis    Asthma    Hypertension    Morbid obesity (HCC)    Shingles    Tobacco use      SURGICAL HISTORY   History reviewed. No pertinent surgical history.   FAMILY HISTORY   Family History  Problem Relation Age of Onset   Hypertension Father      SOCIAL HISTORY   Social History   Tobacco Use   Smoking status: Some Days    Current packs/day: 0.50    Average packs/day: 0.5 packs/day for 5.0 years (2.5 ttl pk-yrs)    Types: Cigarettes   Smokeless tobacco: Never  Vaping Use   Vaping status: Never Used  Substance Use Topics   Alcohol use: Yes   Drug use: No     MEDICATIONS    Home Medication:    Current Medication:  Current Facility-Administered Medications:    acetaminophen  (TYLENOL ) tablet 650 mg, 650 mg, Oral, Q6H PRN, Antoniette Batty T, MD, 650 mg at 04/26/23  1521   albuterol  (PROVENTIL ) (2.5 MG/3ML) 0.083% nebulizer solution 2.5 mg, 2.5 mg, Inhalation, Q4H PRN, Antoniette Batty T, MD, 2.5 mg at 04/28/23 0549   amLODipine  (NORVASC ) tablet 5 mg, 5 mg, Oral, Daily, Jeane Miguel, Ping T, MD, 5 mg at 04/27/23 4098   diphenhydrAMINE  (BENADRYL ) capsule 25 mg, 25 mg, Oral, Q6H PRN, [place], Elizabeth Achieng, NP, 25 mg at 04/27/23 2112   enoxaparin  (LOVENOX ) injection 70 mg, 70 mg, Subcutaneous, Q24H, Hunt, Madison H, RPH, 70 mg at 04/27/23 2112   fluticasone  furoate-vilanterol (BREO ELLIPTA ) 200-25 MCG/ACT 1 puff, 1 puff, Inhalation, Daily, Antoniette Batty T, MD, 1 puff at 04/27/23 1191   furosemide  (LASIX ) injection 40 mg, 40 mg, Intravenous, Q12H, Darus Engels A, DO, 40 mg at 04/27/23 2106   guaiFENesin -dextromethorphan  (ROBITUSSIN DM) 100-10 MG/5ML syrup 10 mL, 10 mL, Oral, Q6H PRN, Antoniette Batty T, MD   ipratropium-albuterol  (DUONEB) 0.5-2.5 (3) MG/3ML nebulizer solution 3 mL, 3 mL, Nebulization, TID, Lorita Rosa, MD, 3 mL at 04/28/23 0737   loratadine  (CLARITIN ) tablet 10 mg, 10 mg, Oral, Daily, Antoniette Batty T, MD, 10 mg at 04/27/23 4782   methylPREDNISolone  sodium succinate (SOLU-MEDROL ) 125 mg/2 mL injection 80 mg, 80 mg, Intravenous, Q24H, Antoniette Batty T, MD, 80 mg at 04/27/23 2106   metoprolol   succinate (TOPROL -XL) 24 hr tablet 50 mg, 50 mg, Oral, Daily, Antoniette Batty T, MD, 50 mg at 04/27/23 1610   montelukast  (SINGULAIR ) tablet 10 mg, 10 mg, Oral, Daily, Antoniette Batty T, MD, 10 mg at 04/27/23 2106   nicotine  (NICODERM CQ  - dosed in mg/24 hours) patch 14 mg, 14 mg, Transdermal, Daily PRN, Zhang, Ping T, MD, 14 mg at 04/26/23 9604   ondansetron  (ZOFRAN ) tablet 4 mg, 4 mg, Oral, Q6H PRN **OR** ondansetron  (ZOFRAN ) injection 4 mg, 4 mg, Intravenous, Q6H PRN, Antoniette Batty T, MD   oxyCODONE  (Oxy IR/ROXICODONE ) immediate release tablet 5 mg, 5 mg, Oral, Q6H PRN, Zhang, Ping T, MD, 5 mg at 04/27/23 2112   pantoprazole  (PROTONIX ) EC tablet 40 mg, 40 mg, Oral, BID, Darus Engels  A, DO, 40 mg at 04/27/23 2107   senna-docusate (Senokot-S) tablet 1 tablet, 1 tablet, Oral, QHS PRN, [place], MD    ALLERGIES   Patient has no known allergies.     REVIEW OF SYSTEMS    Review of Systems:  Gen:  Denies  fever, sweats, chills weigh loss  HEENT: Denies blurred vision, double vision, ear pain, eye pain, hearing loss, nose bleeds, sore throat Cardiac:  No dizziness, chest pain or heaviness, chest tightness,edema Resp:   reports dyspnea chronically  Gi: Denies swallowing difficulty, stomach pain, nausea or vomiting, diarrhea, constipation, bowel incontinence Gu:  Denies bladder incontinence, burning urine Ext:   Denies Joint pain, stiffness or swelling Skin: Denies  skin rash, easy bruising or bleeding or hives Endoc:  Denies polyuria, polydipsia , polyphagia or weight change Psych:   Denies depression, insomnia or hallucinations   Other:  All other systems negative   VS: BP (!) 141/97 (BP Location: Left Arm)   Pulse 88   Temp 98.7 F (37.1 C)   Resp 20   Ht 5\' 9"  (1.753 m)   Wt (!) 145.2 kg   SpO2 96%   BMI 47.26 kg/m      PHYSICAL EXAM    GENERAL:NAD, no fevers, chills, no weakness no fatigue HEAD: Normocephalic, atraumatic.  EYES: Pupils equal, round, reactive to light. Extraocular muscles intact. No scleral icterus.  MOUTH: Moist mucosal membrane. Dentition intact. No abscess noted.  EAR, NOSE, THROAT: Clear without exudates. No external lesions.  NECK: Supple. No thyromegaly. No nodules. No JVD.  PULMONARY: decreased breath sounds with mild rhonchi worse at bases bilaterally.  CARDIOVASCULAR: S1 and S2. Regular rate and rhythm. No murmurs, rubs, or gallops. No edema. Pedal pulses 2+ bilaterally.  GASTROINTESTINAL: Soft, nontender, nondistended. No masses. Positive bowel sounds. No hepatosplenomegaly.  MUSCULOSKELETAL: No swelling, clubbing, or edema. Range of motion full in all extremities.  NEUROLOGIC: Cranial nerves II through XII are  intact. No gross focal neurological deficits. Sensation intact. Reflexes intact.  SKIN: No ulceration, lesions, rashes, or cyanosis. Skin warm and dry. Turgor intact.  PSYCHIATRIC: Mood, affect within normal limits. The patient is awake, alert and oriented x 3. Insight, judgment intact.       IMAGING   Narrative & Impression  CLINICAL DATA:  Wheezing and shortness of breath   EXAM: CT ANGIOGRAPHY CHEST WITH CONTRAST   TECHNIQUE: Multidetector CT imaging of the chest was performed using the standard protocol during bolus administration of intravenous contrast. Multiplanar CT image reconstructions and MIPs were obtained to evaluate the vascular anatomy.   RADIATION DOSE REDUCTION: This exam was performed according to the departmental dose-optimization program which includes automated exposure control, adjustment of the mA and/or kV  according to patient size and/or use of iterative reconstruction technique.   CONTRAST:  OMNIPAQUE  IOHEXOL  350 MG/ML SOLN   COMPARISON:  Same day chest radiograph and CTA chest 02/21/2023   FINDINGS: Cardiovascular: Negative for acute pulmonary embolism. Normal caliber thoracic aorta. Aberrant right subclavian artery. No pericardial effusion.   Mediastinum/Nodes: Trachea and esophagus are unremarkable. No thoracic adenopathy   Lungs/Pleura: No focal consolidation, pleural effusion, or pneumothorax.   Upper Abdomen: No acute abnormality.   Musculoskeletal: No acute fracture.   Review of the MIP images confirms the above findings.   IMPRESSION: Negative for acute pulmonary embolism. No acute abnormality in the chest.     Electronically Signed   By: Rozell Cornet M.D.   On: 04/02/2023 02:04    ASSESSMENT/PLAN   Acute exacerbation of asthma  - patient with steroid dependent asthma with OSA overlap  - currently on steroids, singulair , diphenydramin, claritin , robitussin, LABA/LAMA/ICS  - continue current scope of therapy  -  likely needs to be started on Dupulimab injections due to refactory asthma with steroid dependence on maximal medical therapy and recurrent admissions to hospital  - absolutely needs some assistance with smoking cessation   - lifestyle modification for weight management  - complicated by OSA    Tobacco dependence    - smoking cessation - consider chantix/wellbutrin - counseling provided for smoking cessation - outpatient follow up    Obesity - morbid with comorbidities   - significant impact on asthma severity    - recommendation for weight reduction with ozempic/mounjaro/wygovi/zepbound   Physical deconditioning - increase cardiovascular activity and use incentive spirometry at this time to reduce atelectatic changes and recruit lungs  OSA    - continue CPAP, optimize interface for comfort  Outpatient follow up        Thank you for allowing me to participate in the care of this patient.   Patient/Family are satisfied with care plan and all questions have been answered.    Provider disclosure: Patient with at least one acute or chronic illness or injury that poses a threat to life or bodily function and is being managed actively during this encounter.  All of the below services have been performed independently by signing provider:  review of prior documentation from internal and or external health records.  Review of previous and current lab results.  Interview and comprehensive assessment during patient visit today. Review of current and previous chest radiographs/CT scans. Discussion of management and test interpretation with health care team and patient/family.   This document was prepared using Dragon voice recognition software and may include unintentional dictation errors.     Ernisha Sorn, M.D.  Division of Pulmonary & Critical Care Medicine

## 2023-04-28 NOTE — Progress Notes (Signed)
 Urine sent to lab

## 2023-04-28 NOTE — Progress Notes (Signed)
 Progress Note   Patient: Justin Reid VHQ:[id number] DOB: Mar 22, 1980 DOA: 04/25/2023     2 DOS: the patient was seen and examined on 04/28/2023   Brief hospital course: 42yo with h/o moderate asthma with ongoing tobacco use, VCD, OSA on CPAP, and morbid obesity who presented on 4/21 with SOB and was found to have asthma exacerbation requiring BIPAP intermittently.  Also reported B knee pain and swelling, found to have B lateral knee effusions.  Assessment and Plan:  Acute severe exacerbation of severe persistent asthma Suspect triggered by ongoing cigarette smoking, not having inhalers due to cost, breathing status also complicated by body habitus Weaned off BiPAP - continue PRN CPAP qhs Supplement O2, maintain sats > 90%, appears to need home O2 IV steroids -> PO prednisone  ICS and LABA DuoNeb every 6 hours and as needed albuterol  Pulmonology consulted - agrees with current therapy, will likely start immunomodulators as an outpatient   Bilateral knee pain and thigh swelling No injury, no trauma per patient U/S dopplers to check for DVT's -- negative for DVT's but shows fluid collections lateral to both knees (right up to 7.6 cm, left up to 12.6 cm), broad differential.  Not seen on prior study just in Feb. Outpatient orthopedics follow up   RLQ abdominal pain  CT abdomen/pelvis negative   Morbid obesity Body mass index is 47.26 kg/m.' Complicates overall care and prognosis Recommend lifestyle modifications including physical activity and diet for weight loss and overall long-term health Consider bariatric referral Consider GLP-1 therapy, but currently cost-prohibitive   Cigarette smoking Counseled each admission about importance of cessation, especially given the severity of his asthma Nicotine  patch   Presumed OSA Wife reports snoring and apnea during sleep Has habitus suggestive of OSA Breathing improved with CPAP and he slept better here Needs outpatient sleep  study  Concern for cocaine use Patient appeared to acknowledge use when discussed on 4/23 UDS sent on 4/24 but this appears to have been a water sample on visual exam and was fully negative Cessation encouraged       Consultants: None   Procedures: None   Antibiotics: None  30 Day Unplanned Readmission Risk Score    Flowsheet Row ED to Hosp-Admission (Current) from 04/25/2023 in [facility] (1A)  30 Day Unplanned Readmission Risk Score (%) 46.1 Filed at 04/28/2023 1200       This score is the patient's risk of an unplanned readmission within 30 days of being discharged (0 -100%). The score is based on dignosis, age, lab data, medications, orders, and past utilization.   Low:  0-14.9   Medium: 15-21.9   High: 22-29.9   Extreme: 30 and above           Subjective: Reports ongoing issues with SOB, appeared to have some breath holding with attempts at ambulation.  Now qualifies for home O2.   Objective: Vitals:   04/28/23 0811 04/28/23 1355  BP: (!) 141/97   Pulse: 88   Resp: 20   Temp: 98.7 F (37.1 C)   SpO2: 96% 95%    Intake/Output Summary (Last 24 hours) at 04/28/2023 1357 Last data filed at 04/28/2023 1103 Gross per 24 hour  Intake 720 ml  Output --  Net 720 ml   Filed Weights   04/25/23 1410  Weight: (!) 145.2 kg    Exam:  General:  Appears calm and comfortable and is in NAD, on [place] O2, audible wheezing appears intentional Eyes:  EOMI, normal lids, iris  ENT:  grossly normal hearing, lips & tongue; CPAP in place Neck:  no LAD, masses or thyromegaly Cardiovascular:  RRR, no m/r/g. No LE edema.  Respiratory:   CTA bilaterally with no reasonable air movement despite audible wheezing at bedside, ?upper airway noise.  Normal respiratory effort. Abdomen:  soft, NT, ND Skin:  no rash or induration seen on limited exam Musculoskeletal:  grossly normal tone BUE/BLE; B lateral-superior knee effusions Psychiatric:  grossly  normal mood and affect, speech normal Neurologic:  CN 2-12 grossly intact, moves all extremities in coordinated fashion  Data Reviewed: I have reviewed the patient's lab results since admission.  Pertinent labs for today include:   Glucose 165 BUN 22, improved WBC 11.8 Hgb 12.6 Platelets 403     Family Communication: Significant other was present throughout  Disposition: Status is: Inpatient Remains inpatient appropriate because: ongoing monitoring     Time spent: 50 minutes  Unresulted Labs (From admission, onward)    None        Author: Lorita Rosa, MD 04/28/2023 1:57 PM  For on call review [email or web address].

## 2023-04-28 NOTE — Progress Notes (Signed)
 Ambulated patient on room air, oxygen  saturation started dropping to 88% (started at 97%), but patient seemed to be holding his breath. Nurse asked patient to breathe several times, nurse called for a wheelchair because patient could not go on ambulating. Oxygen  saturations went back up to 97% on room air after patient sat down and took breath.   After lunch NT ambulated patient on 2 liters oxygen  and patient did worse than he did on room air, NT stated patient saturations dropped to 79%, patient felt lightheaded and  had to be returned to room in wheelchair. MD notified of above. Will continue to monitor.

## 2023-04-29 DIAGNOSIS — M25461 Effusion, right knee: Secondary | ICD-10-CM | POA: Diagnosis present

## 2023-04-29 MED ORDER — FUROSEMIDE 40 MG PO TABS: 40.0000 mg | ORAL_TABLET | Freq: Every day | ORAL | 0 refills | Status: DC

## 2023-04-29 NOTE — Discharge Instructions (Addendum)
 Agency Name: [facility] Address: [address], [place], [place] [address] Phone: [phone] Website: www.[email or web address].org Service(s) Offered: Housing services, self-sufficiency, congregate meal program, and individual development account program.  Agency Name: [employer] [facility] [place] Address: [address], [place], [place] [address] Phone: [phone] Email: [email or web address] Website: www.[email or web address].org Service(s) Offered: Housing the homeless, feeding the hungry, Company secretary, job and education related services.  Agency Name: [facility] Address: [address] [place], [place] [address] Phone: [phone] Email: [email or web address] Service(s) Offered: Counseling, problem pregnancy, advocacy for Hispanics, limited emergency financial assistance.  Agency Name: Department of Social Services Address: [address] [place], [place] [address] Phone: [phone] Website: [email or web address] Service(s) Offered: Child support services; child welfare services; SNAP; Medicaid; work first family assistance; and aid with fuel,  rent, food and medicine.  Agency Name: [facility] Address: [address], [place], [place] [address] Phone: [phone] or [phone] Email: [email or web address] Service(s) Offered: Family services and transient assistance; emergency food, fuel, clothing, limited furniture, utilities; budget counseling, general counseling; give a kid a coat; thrift store; Christmas food and toys. Utility assistance, food pantry, rental  assistance, life sustaining medicine  Rent/Utility/Housing  Agency Name: [facility] Address: [address] [place], [place] [address] Phone: [phone] Email: [email or web address] Website: www.[email or web address].org Service(s) Offered: Housing services, self-sufficiency, congregate meal  program, weatherization program, Field seismologist program, emergency food assistance,  housing counseling, home ownership program, wheels -towork program.  Agency Name: [facility] Address: [address], [place], [place] [address] Phone: [phone] (8a-4p) [phone] (8p- 10p) Email: [email or web address] Website: [email or web address] Service(s) Offered: A program for homeless and/or needy men that includes one-on-one counseling, life skills training and job rehabilitation.  Agency Name: [employer] [facility] [place] Address: [address] [place], [place] [address] Phone: [phone] Website: www.alliedchurches.org Service(s) Offered: Assistance to needy in emergency with utility bills, heating fuel, and prescriptions. Shelter for homeless 7pm-7am. April 29, 2016 15  Agency Name: [facility] [place] (Developmentally Disabled) Address: [address], [place], [place] [address] Phone: [phone] Contact Person: Genie Key Email: [email or web address] Website: [email or web address].[email or web address] Service(s) Offered: Helps individuals with developmental disabilities move from housing that is more restrictive to homes where they  can achieve greater independence and have more  opportunities.  Agency Name: [facility] Address: [address], [place], [place] [address] Phone: [phone] Email: [email or web address] Website: www.burlingtonhousingauthority.org Service(s) Offered: Provides affordable housing for low-income families, elderly, and disabled individuals. Offer a wide range of  programs and services, from financial planning to afterschool and summer programs.  Agency Name: Department of Social Services Address: [address] [place], [place] [address] Phone: [phone] Service(s) Offered: Child support services; child welfare services; food stamps; Medicaid; work first family assistance; and aid with  fuel,  rent, food and medicine.  Agency Name: [facility] [place] [employer]. Address: [facility] [address], [place], [place]  [address] Phone: [phone] Website: www.familyabuseservices.org Service(s) Offered: 24 hour Crisis Line: [phone]; 24 hour Emergency Shelter; Transitional Housing; Support Groups; Scientist, physiological; [employer]; Hispanic Outreach: [phone];  Visitation Center: [phone].  Agency Name: [facility] [place]. Address: [address] [place], [place] [address] Phone: [phone] Service(s) Offered: CAP Services; Home and [employer]; Individual or Group Supports; Respite Care Non-Institutional Nursing;  Residential Supports; Respite Care and Personal Care Services; Transportation; Family and Friends Night; Recreational Activities; Three Nutritious Meals/Snacks; Consultation with Registered Dietician; Twenty-four hour Registered Nurse Access; Daily and [employer]  Living Skills; Camp Green Leaves; [place] for the [employer] (During Summer Months) Bingo Night (Every  Wednesday Night); Special Populations Dance Night  (Every Tuesday Night); Professional Hair Care Services.  Agency Name: [facility] Address: [address], [place], [place] [address] Phone: [phone] Contact Person: Richardo Chandler Website: [email or web address].Physicist, medical) Offered: Residential treatment facility for women; food and  clothing, educational & employment development and  transportation to work; Counsellor of financial skills;  parenting and family reunification; emotional and spiritual  support; transitional housing for program graduates.  Agency Name: [facility] Address: [address] [place], [place] [address] Phone: [phone] Email: [email or web address] Website: TaskTown.es Service(s) Offered: Public housing units for elderly, disabled, and low income  people; housing choice vouchers for income eligible  applicants; shelter plus care vouchers; and Psychologist, clinical.  Agency Name: [facility] [employer] Address: [address], [place], [place] [address] Phone: [phone] Email: [email or web address] Website: www.habitatalamance.org Service(s) Offered: Build houses for families in need of decent housing. Each adult in the family must invest 200 hours of labor on  someone else's house, work with volunteers to build their own house, attend classes on budgeting, home maintenance, yard care, and attend homeowner association meetings.  Agency Name: [facility] Address: [address], [place], [place] [address] Phone: [phone] Website: www.rsli.org Service(s) Offered: Intermediate care facilities for intellectually delayed, Supervised Living in group homes for adults with developmental disabilities, Supervised Living for people who have dual diagnoses (MRMI), Independent Living, Supported Living, respite and a variety of CAP services, pre-vocational services, day supports, and [employer].  Agency Name: [place] [employer] [facility] Phone: [phone] Website: www.NCForeclosurePrevention.gov Service(s) Offered: Zero-interest, deferred loans to homeowners struggling to pay their mortgage. Call for more information.  Food Resources  Agency Name: [facility] Agency Address: [address] [place], [place] [address] Phone: [phone] Website: www.alamanceservices.org Service(s) Offered: Housing services, self-sufficiency, congregate meal program, weatherization program, Event organiser program, emergency food assistance,  housing counseling, home ownership program, wheels - to work program.  Dole Food free for 60 and older at various locations from [employer], Monday-Friday:  [employer], [address] [place], [phone] -[facility], [address] [place] [phone]  -[facility], [address], [place] [phone]  -[address], [place], [phone]  Agency Name: [facility] Address: [id number], [place], [place] [address] Phone: [phone] Website: www.alamancemow.org Service(s) Offered: Home delivered hot, frozen, and emergency  meals. Grocery assistance program which matches  volunteers one-on-one with seniors unable to grocery shop  for themselves. Must be 60 years and older; less than 20  hours of in-home aide service, limited or no driving ability;  live alone or with someone with a disability; live in  [place].  Agency Name: [facility]) Address: [address] [place], [place] [address] Phone: [phone] Service(s) Offered: Food is served to shut-ins, homeless, elderly, and low income people in the community every Saturday (11:30 am-12:30 pm) and Sunday (12:30 pm-1:30pm). Volunteers also offer help and encouragement in seeking employment,  and spiritual guidance.  Agency Name: Department of Social Services Address: [address] [place], [place] [address] Phone: [phone] Service(s) Offered: Child support services; child welfare services; food stamps; Medicaid; work first family assistance; and aid with fuel,  rent, food and medicine.  Agency Name: [facility] Address: [address], [place], [place] Phone:  [phone] Website: www.dreamalign.com Services Offered: Monday 10:00am-12:00, 8:00pm-9:00pm, and Friday 10:00am-12:00.  Agency Name: [facility] [place] Address: [address], [place], [place] [address] Phone: [phone] Website: www.alliedchurches.org Service(s) Offered: Serves weekday meals, open from 11:30 am- 1:00 pm., and 6:30-7:30pm, Monday-Wednesday-Friday distributes food 3:30-6pm,  Monday-Wednesday-Friday.  Agency Name: [facility] Address: [address] [place], [place] Phone: [phone] Website: www.[email or web address].org Services Offered: Distributes food the 4th Saturday of the month, starting at 8:00 am  Agency Name: [facility] Address: [phone] [address] [place], [place] [address] Phone: [phone] Website: [email or web address]://hbc.[email or web address] Service(s) Offered: Bread of life, weekly food pantry. Open Wednesdays from 10:00am-noon.  Agency Name: [facility] Address: [address] [place], [place], [place] Phone: [phone] Services Offered: Distributes food 9am-1pm, Monday-Thursday. Call for details.  Agency Name: [facility] Address: [address], [place], [place] [address] Phone: [phone] Website: firstbaptistburlington.com Service(s) Offered: Games developer. Call for assistance.  Agency Name: [facility] Address: [address], [place], [place] [address] Phone: [phone] Service Offered: Emergency Food Pantry. Call for appointment.  Agency Name: [facility] Address: [address] [place], [place] [address] Phone: [phone] Website: [email or web address] Services Offered: Games developer. Call for details  Agency Name: [facility] Address: [address] [place], [place] Phone: [phone] Website: [email or web address] Service(s) Offered: Emergency Food Pantry. Call for details.  Agency Name: [facility] representative Address: [address] [place], [place] [address] Phone: [phone] or [phone] Website: [email or web address].salvationarmy.[email or web address] Service(s) Offered: Distribute food 9am-11:30 am, Tuesday-Friday, and 1-3:30pm, Monday-Friday. Food pantry Monday-Friday 1pm-3pm, fresh items, Mon.-Wed.-Fri.  Agency Name: [facility] Address: [address] [place], [place] [address] Phone:  [phone] Website: www.safealamance.org Services Offered: Distribute food Tues and Sats from 9:00am-noon. Closed 1st Saturday of each month. Call for details  Agency Name: [facility] Address: [facility] [address] [place] [address] Phone: [phone]  Services Offered: Delivers meals every Thursday   Intensive Outpatient Programs   [facility] [address] [place],  [place], [place] [phone]  [facility] (Inpatient and [phone] (Suboxone and Methadone) [address] [phone]  ADS: [facility] & [facility] Programs - Intensive Outpatient [address] [place], [place] [address], [place]  [phone]  [facility] (Outpatient, Inpatient, [facility] (Groups and Residental) (insurance only) [phone] [place], [place] [phone]   [facility] (for caregivers and family) [address] Amy Kansky [address] [place], [place] [phone]  Residential Treatment Programs  [facility](2 years) Residential: 23 days)[facility] [address] [place], [place], [place] [phone] or [phone]  [facility].R[facility]E.A.M[facility]S Treatment [facility] [address] [place], [place], [place] [phone]  [facility] ([facility]) [address] [place], [place], [place] [phone] Admissions: 8am-3pm M-F  [facility]ATS Program: Residential Program [phone] Days)             [facility]: [place]  [facility] [place],  [place], [place]  [phone] or [phone] in Hours over the weekend or by referral)  [facility] [address] [place], [place] [address] [phone] (Do virtual or phone assessment, offer transportation within 25 miles, have in patient and Outpatient options)   Mobil  Crisis: [facility]:[phone] (for crisis response 24 hours a day)

## 2023-04-29 NOTE — Progress Notes (Signed)
 Patient ambulated with NT this morning, he had just taken off Cpap, oxygen  saturations remained at 96% for the walk around the nurses station on room air.

## 2023-04-29 NOTE — Discharge Summary (Signed)
 Physician Discharge Summary   Patient: Justin Reid MRN: [id number] DOB: [birth year]  Admit date:     04/25/2023  Discharge date: 04/29/23  Discharge Physician: Lorita Rosa   PCP: Pcp, No   Recommendations at discharge:   You are being discharged to home and do not appear to currently qualify for home O2 Follow up with PCP, appointment 5/2 at 1015 Follow up with Dr. Jaclynn Mast from pulmonology on 4/30 at 1pm Add Lasix  40 mg daily Follow up with orthopedics, referral placed Stop smoking; continue nicotine  patch Do not use cocaine or other illicit drugs Work on weight loss with diet and exercise; consider weight loss medications and/or surgery  Discharge Diagnoses: Principal Problem:   Acute severe exacerbation of severe persistent asthma Active Problems:   OSA (obstructive sleep apnea)   Obesity, Class III, BMI 40-49.9 (morbid obesity) (HCC)   Tobacco abuse   Bilateral knee effusions   Hospital Course: 43yo with h/o moderate asthma with ongoing tobacco use, VCD, OSA on CPAP, and morbid obesity who presented on 4/21 with SOB and was found to have asthma exacerbation requiring BIPAP intermittently.  Also reported B knee pain and swelling, found to have B lateral knee effusions.  Assessment and Plan:  Acute severe exacerbation of severe persistent asthma Suspect triggered by ongoing cigarette smoking, not having inhalers due to cost, breathing status also complicated by body habitus Weaned off BiPAP  He is likely to benefit from CPAP at bedtime but will need an outpatient sleep study There is not a clear need for home O2 and since he is Medicaid pending this would require a financial commitment on his part in order to obtain it regardless IV steroids -> PO prednisone  ICS and LABA DuoNeb every 6 hours and as needed albuterol  Pulmonology consulted - agrees with current therapy, will likely start immunomodulators as an outpatient   Bilateral knee pain and thigh swelling No  injury, no trauma per patient U/S dopplers to check for DVT's -- negative for DVT's but shows fluid collections lateral to both knees (right up to 7.6 cm, left up to 12.6 cm), broad differential.  Not seen on prior study just in Feb. Lasix  40 mg daily ordered Outpatient orthopedics follow up   RLQ abdominal pain  CT abdomen/pelvis negative   Morbid obesity Body mass index is 47.26 kg/m.' Complicates overall care and prognosis Recommend lifestyle modifications including physical activity and diet for weight loss and overall long-term health Consider bariatric referral Consider GLP-1 therapy, but currently cost-prohibitive   Cigarette smoking Counseled each admission about importance of cessation, especially given the severity of his asthma Nicotine  patch   Presumed OSA Wife reports snoring and apnea during sleep Has habitus suggestive of OSA Breathing improved with CPAP and he slept better here Needs outpatient sleep study   Concern for malingering Patient appeared to acknowledge use of cocaine when discussed on 4/23 UDS sent on 4/24 but this appears to have been a water sample on visual exam and was fully negative Cessation encouraged Also with suspicion of breath holding on ambulatory pulse ox per nursing staff Discussed with [facility] team, who reports housing insecurity and significant outpatient noncompliance as contributing factors  He does not appear to have ongoing need for acute hospitalization but is at high risk for rehospitalization       Consultants: None   Procedures: None   Antibiotics: None  Pain control - [place]  Controlled Substance Reporting System database was reviewed. and patient was instructed, not to drive, operate  heavy machinery, perform activities at heights, swimming or participation in water activities or provide baby-sitting services while on Pain, Sleep and Anxiety Medications; until their outpatient Physician has advised to do so again.  Also recommended to not to take more than prescribed Pain, Sleep and Anxiety Medications.    Disposition: Home Diet recommendation:  Regular diet DISCHARGE MEDICATION: Allergies as of 04/29/2023   No Known Allergies      Medication List     STOP taking these medications    oxyCODONE  5 MG immediate release tablet Commonly known as: Oxy IR/ROXICODONE    predniSONE  20 MG tablet Commonly known as: DELTASONE        TAKE these medications    acetaminophen  325 MG tablet Commonly known as: TYLENOL  Take 2 tablets (650 mg total) by mouth every 6 (six) hours as needed for mild pain (pain score 1-3) or fever.   albuterol  108 (90 Base) MCG/ACT inhaler Commonly known as: VENTOLIN  HFA Inhale 2 puffs into the lungs every 4 (four) hours as needed for wheezing or shortness of breath.   amLODipine  5 MG tablet Commonly known as: NORVASC  Take 1 tablet (5 mg total) by mouth daily.   Dextromethorphan -guaiFENesin  20-200 MG/20ML Liqd Take 10 mLs by mouth every 6 (six) hours as needed.   furosemide  40 MG tablet Commonly known as: LASIX  Take 1 tablet (40 mg total) by mouth daily. Start taking on: April 30, 2023   loratadine  10 MG tablet Commonly known as: CLARITIN  Take 1 tablet (10 mg total) by mouth daily.   metoprolol  succinate 50 MG 24 hr tablet Commonly known as: TOPROL -XL Take 1 tablet (50 mg total) by mouth daily. Take with or immediately following a meal.   montelukast  10 MG tablet Commonly known as: SINGULAIR  Take 1 tablet (10 mg total) by mouth daily.   nicotine  14 mg/24hr patch Commonly known as: NICODERM CQ  - dosed in mg/24 hours Place 1 patch (14 mg total) onto the skin daily as needed (nicotine  craving).   pantoprazole  40 MG tablet Commonly known as: PROTONIX  Take 1 tablet (40 mg total) by mouth daily.   Wixela Inhub  250-50 MCG/ACT Aepb Generic drug: fluticasone -salmeterol Inhale 1 puff into the lungs in the morning and at bedtime.         Follow-up  Information     Erskin Hearing, MD. Go on 05/04/2023.   Specialty: Pulmonary Disease Why: Please arrive at 1pm for your 1:15pm appointment. Contact information: [address] [place] [address] [phone]         Wilhelmena Hanson, FNP. Go on 05/06/2023.   Specialty: Family Medicine Why: Please arrive at 10:15am for your 10:30am NEW PATIENT appointment Contact information: [address] [place] [address] [phone]                Discharge Exam:   Subjective: Feeling better today.  Ready to go home.  After our evaluation, he reported to the nurse that he would like a prescription for oxy for back pain - not provided, Tylenol  and PCP f/u is appropriate.   Objective: Vitals:   04/29/23 0808 04/29/23 0818  BP:  120/79  Pulse:  77  Resp:  17  Temp:  97.8 F (36.6 C)  SpO2: 99% 98%    Intake/Output Summary (Last 24 hours) at 04/29/2023 1126 Last data filed at 04/29/2023 1009 Gross per 24 hour  Intake 480 ml  Output --  Net 480 ml   Filed Weights   04/25/23 1410  Weight: (!) 145.2 kg  Exam:  General:  Appears calm and comfortable and is in NAD, on [place] O2 (normal sats, not needed, using for comfort) Eyes:  EOMI, normal lids, iris ENT:  grossly normal hearing, lips & tongue Cardiovascular:  RRR, no m/r/g. No LE edema.  Respiratory:   CTA bilaterally.  Normal respiratory effort. Abdomen:  soft, NT, ND Skin:  no rash or induration seen on limited exam Musculoskeletal:  grossly normal tone BUE/BLE; B lateral-superior knee effusions Psychiatric:  grossly normal mood and affect, speech normal Neurologic:  CN 2-12 grossly intact, moves all extremities in coordinated fashion  Data Reviewed: I have reviewed the patient's lab results since admission.  Pertinent labs for today include:  Glucose 165 BUN 22, improved WBC 11.8 (demargination from steroids) Hgb 12.6 Platelets 403    Condition at discharge: stable  The results of significant  diagnostics from this hospitalization (including imaging, microbiology, ancillary and laboratory) are listed below for reference.   Imaging Studies: CT ABDOMEN PELVIS W CONTRAST Result Date: 04/26/2023 CLINICAL DATA:  Right lower quadrant abdominal pain EXAM: CT ABDOMEN AND PELVIS WITH CONTRAST TECHNIQUE: Multidetector CT imaging of the abdomen and pelvis was performed using the standard protocol following bolus administration of intravenous contrast. RADIATION DOSE REDUCTION: This exam was performed according to the departmental dose-optimization program which includes automated exposure control, adjustment of the mA and/or kV according to patient size and/or use of iterative reconstruction technique. CONTRAST:  OMNIPAQUE  IOHEXOL  350 MG/ML SOLN COMPARISON:  CT 04/13/2022 FINDINGS: Lower chest: No acute abnormality. Hepatobiliary: Unremarkable liver, gallbladder, and biliary tree. Pancreas: Unremarkable. Spleen: Unremarkable. Adrenals/Urinary Tract: Normal adrenal glands. Low attenuation cyst with peripheral punctate calcification in the left kidney. No follow-up recommended. No urinary calculi or hydronephrosis. Unremarkable bladder. Stomach/Bowel: Normal caliber large and small bowel. Stomach is within normal limits. No bowel wall thickening. The appendix is normal. Vascular/Lymphatic: No significant vascular findings are present. No enlarged abdominal or pelvic lymph nodes. Reproductive: Unremarkable. Other: No free intraperitoneal fluid or air. Musculoskeletal: No acute fracture. Soft tissue nodules in the ventral abdominal wall subcutaneous fat likely related to injections. IMPRESSION: No acute abnormality in the abdomen or pelvis. Normal appendix. Electronically Signed   By: Rozell Cornet M.D.   On: 04/26/2023 19:54   US  Venous Img Lower Bilateral (DVT) Result Date: 04/26/2023 CLINICAL DATA:  Bilateral lower extremity edema for 2 days EXAM: BILATERAL LOWER EXTREMITY VENOUS DOPPLER ULTRASOUND  TECHNIQUE: Gray-scale sonography with graded compression, as well as color Doppler and duplex ultrasound were performed to evaluate the lower extremity deep venous systems from the level of the common femoral vein and including the common femoral, femoral, profunda femoral, popliteal and calf veins including the posterior tibial, peroneal and gastrocnemius veins when visible. The superficial great saphenous vein was also interrogated. Spectral Doppler was utilized to evaluate flow at rest and with distal augmentation maneuvers in the common femoral, femoral and popliteal veins. COMPARISON:  Ultrasound 02/21/2023 FINDINGS: RIGHT LOWER EXTREMITY Common Femoral Vein: No evidence of thrombus. Normal compressibility, respiratory phasicity and response to augmentation. Saphenofemoral Junction: No evidence of thrombus. Normal compressibility and flow on color Doppler imaging. Profunda Femoral Vein: No evidence of thrombus. Normal compressibility and flow on color Doppler imaging. Femoral Vein: No evidence of thrombus. Normal compressibility, respiratory phasicity and response to augmentation. Popliteal Vein: No evidence of thrombus. Normal compressibility, respiratory phasicity and response to augmentation. Calf Veins: No evidence of thrombus. Normal compressibility and flow on color Doppler imaging. Superficial Great Saphenous Vein: No evidence of thrombus. Normal  compressibility. Venous Reflux:  None. Other Findings: There is a oblong fluid collection identified without flow on Doppler lateral to the knee measuring 7.6 x 2.4 x 7.0 cm. LEFT LOWER EXTREMITY Common Femoral Vein: No evidence of thrombus. Normal compressibility, respiratory phasicity and response to augmentation. Saphenofemoral Junction: No evidence of thrombus. Normal compressibility and flow on color Doppler imaging. Profunda Femoral Vein: No evidence of thrombus. Normal compressibility and flow on color Doppler imaging. Femoral Vein: No evidence of  thrombus. Normal compressibility, respiratory phasicity and response to augmentation. Popliteal Vein: No evidence of thrombus. Normal compressibility, respiratory phasicity and response to augmentation. Calf Veins: No evidence of thrombus. Normal compressibility and flow on color Doppler imaging. Superficial Great Saphenous Vein: No evidence of thrombus. Normal compressibility. Venous Reflux:  None. Other Findings: Oblong fluid collection lateral to the knee identified measuring 8.3 x 2.2 x 12.6 cm. IMPRESSION: No evidence of bilateral lower extremity DVT. There or fluid collections lateral to both knees measuring up to 7.6 cm on the right and 8.3 x 12.6 cm on the left. Please correlate for any history specifically including trauma or other process. Otherwise recommend further evaluation such as CT or MRI when clinically appropriate to better define these fluid collections. They have a broad differential. These were not seen on the prior study of February 2025. Electronically Signed   By: Adrianna Horde M.D.   On: 04/26/2023 16:34   DG Chest Port 1 View Result Date: 04/25/2023 CLINICAL DATA:  Shortness of breath. EXAM: PORTABLE CHEST 1 VIEW COMPARISON:  04/19/2023. FINDINGS: Low lung volumes with associated accentuation of the cardiac silhouette. Mediastinal contours are within normal limits. No focal consolidation, sizeable pleural effusion, or pneumothorax. Visualized osseous structures are unchanged. IMPRESSION: Low lung volumes.  No acute cardiopulmonary findings. Electronically Signed   By: Mannie Seek M.D.   On: 04/25/2023 17:47   DG Chest 2 View Result Date: 04/19/2023 CLINICAL DATA:  cp, sob EXAM: CHEST - 2 VIEW COMPARISON:  04/09/2023 FINDINGS: Lungs are clear.  No pneumothorax. Heart size and mediastinal contours are within normal limits. No effusion. Possible free osteochondral bodies in the left shoulder. IMPRESSION: No acute cardiopulmonary disease. Electronically Signed   By: Nicoletta Barrier M.D.    On: 04/19/2023 17:54   DG Lumbar Spine 2-3 Views Result Date: 04/11/2023 CLINICAL DATA:  Low back pain. EXAM: LUMBAR SPINE - 2-3 VIEW COMPARISON:  None Available. FINDINGS: Five non-rib-bearing lumbar vertebra. Normal lumbar alignment. Anterior spurring at T12-L1. Slight L3-L4 disc space narrowing. No evidence of fracture, pars defects or focal bone abnormality. Sacroiliac joints are congruent. IMPRESSION: Mild degenerative disc disease at T12-L1 and L3-L4. Electronically Signed   By: Chadwick Colonel M.D.   On: 04/11/2023 16:29   DG Chest Portable 1 View Result Date: 04/09/2023 CLINICAL DATA:  Cough. EXAM: PORTABLE CHEST 1 VIEW COMPARISON:  Chest radiograph dated 04/01/2023 and CT dated 04/02/2023. FINDINGS: No focal consolidation, pleural effusion, pneumothorax. The cardiac silhouette is within limits. No acute osseous pathology. IMPRESSION: No active disease. Electronically Signed   By: Angus Bark M.D.   On: 04/09/2023 17:10   CT Angio Chest Pulmonary Embolism (PE) W or WO Contrast Result Date: 04/02/2023 CLINICAL DATA:  Wheezing and shortness of breath EXAM: CT ANGIOGRAPHY CHEST WITH CONTRAST TECHNIQUE: Multidetector CT imaging of the chest was performed using the standard protocol during bolus administration of intravenous contrast. Multiplanar CT image reconstructions and MIPs were obtained to evaluate the vascular anatomy. RADIATION DOSE REDUCTION: This exam was performed according to  the departmental dose-optimization program which includes automated exposure control, adjustment of the mA and/or kV according to patient size and/or use of iterative reconstruction technique. CONTRAST:  OMNIPAQUE  IOHEXOL  350 MG/ML SOLN COMPARISON:  Same day chest radiograph and CTA chest 02/21/2023 FINDINGS: Cardiovascular: Negative for acute pulmonary embolism. Normal caliber thoracic aorta. Aberrant right subclavian artery. No pericardial effusion. Mediastinum/Nodes: Trachea and esophagus are unremarkable.  No thoracic adenopathy Lungs/Pleura: No focal consolidation, pleural effusion, or pneumothorax. Upper Abdomen: No acute abnormality. Musculoskeletal: No acute fracture. Review of the MIP images confirms the above findings. IMPRESSION: Negative for acute pulmonary embolism. No acute abnormality in the chest. Electronically Signed   By: Rozell Cornet M.D.   On: 04/02/2023 02:04   DG Chest 2 View Result Date: 04/01/2023 CLINICAL DATA:  Wheezing and shortness of breath. EXAM: CHEST - 2 VIEW COMPARISON:  Chest x-ray dated March 24, 2023. FINDINGS: The heart size and mediastinal contours are within normal limits. Normal pulmonary vascularity. No focal consolidation, pleural effusion, or pneumothorax. No acute osseous abnormality. Advanced degenerative changes of the left shoulder with multiple glenohumeral intra-articular bodies again noted. IMPRESSION: 1. No acute cardiopulmonary disease. Electronically Signed   By: Aleta Anda M.D.   On: 04/01/2023 17:10    Microbiology: Results for orders placed or performed during the hospital encounter of 04/25/23  Resp panel by RT-PCR (RSV, Flu A&B, Covid) Anterior Nasal Swab     Status: None   Collection Time: 04/25/23  2:31 PM   Specimen: Anterior Nasal Swab  Result Value Ref Range Status   SARS Coronavirus 2 by RT PCR NEGATIVE NEGATIVE Final    Comment: (NOTE) SARS-CoV-2 target nucleic acids are NOT DETECTED.  The SARS-CoV-2 RNA is generally detectable in upper respiratory specimens during the acute phase of infection. The lowest concentration of SARS-CoV-2 viral copies this assay can detect is 138 copies/mL. A negative result does not preclude SARS-Cov-2 infection and should not be used as the sole basis for treatment or other patient management decisions. A negative result may occur with  improper specimen collection/handling, submission of specimen other than nasopharyngeal swab, presence of viral mutation(s) within the areas targeted by this  assay, and inadequate number of viral copies(<138 copies/mL). A negative result must be combined with clinical observations, patient history, and epidemiological information. The expected result is Negative.  Fact Sheet for Patients:  BloggerCourse.com  Fact Sheet for Healthcare Providers:  SeriousBroker.it  This test is no t yet approved or cleared by the [place]  FDA and  has been authorized for detection and/or diagnosis of SARS-CoV-2 by FDA under an Emergency Use Authorization (EUA). This EUA will remain  in effect (meaning this test can be used) for the duration of the COVID-19 declaration under Section 564(b)(1) of the Act, 21 U.S.C.section 360bbb-3(b)(1), unless the authorization is terminated  or revoked sooner.       Influenza A by PCR NEGATIVE NEGATIVE Final   Influenza B by PCR NEGATIVE NEGATIVE Final    Comment: (NOTE) The Xpert Xpress SARS-CoV-2/FLU/RSV plus assay is intended as an aid in the diagnosis of influenza from Nasopharyngeal swab specimens and should not be used as a sole basis for treatment. Nasal washings and aspirates are unacceptable for Xpert Xpress SARS-CoV-2/FLU/RSV testing.  Fact Sheet for Patients: BloggerCourse.com  Fact Sheet for Healthcare Providers: SeriousBroker.it  This test is not yet approved or cleared by the [place]  FDA and has been authorized for detection and/or diagnosis of SARS-CoV-2 by FDA under an Emergency Use Authorization (EUA).  This EUA will remain in effect (meaning this test can be used) for the duration of the COVID-19 declaration under Section 564(b)(1) of the Act, 21 U.S.C. section 360bbb-3(b)(1), unless the authorization is terminated or revoked.     Resp Syncytial Virus by PCR NEGATIVE NEGATIVE Final    Comment: (NOTE) Fact Sheet for Patients: BloggerCourse.com  Fact Sheet for  Healthcare Providers: SeriousBroker.it  This test is not yet approved or cleared by the [place]  FDA and has been authorized for detection and/or diagnosis of SARS-CoV-2 by FDA under an Emergency Use Authorization (EUA). This EUA will remain in effect (meaning this test can be used) for the duration of the COVID-19 declaration under Section 564(b)(1) of the Act, 21 U.S.C. section 360bbb-3(b)(1), unless the authorization is terminated or revoked.  Performed at [facility], [address] [place], [place] [address]     Labs: CBC: Recent Labs  Lab 04/25/23 1422 04/28/23 0643  WBC 10.4 11.8*  NEUTROABS 6.5 10.6*  HGB 13.3 12.6*  HCT 40.8 39.6  MCV 84.8 85.3  PLT 407* 403*   Basic Metabolic Panel: Recent Labs  Lab 04/25/23 1422 04/28/23 0643  NA 135 136  K 3.7 4.4  CL 99 99  CO2 26 27  GLUCOSE 90 165*  BUN 24* 22*  CREATININE 1.00 0.99  CALCIUM 8.9 9.2   Liver Function Tests: No results for input(s): "AST", "ALT", "ALKPHOS", "BILITOT", "PROT", "ALBUMIN" in the last 168 hours. CBG: No results for input(s): "GLUCAP" in the last 168 hours.  Discharge time spent: greater than 30 minutes.  Signed: Lorita Rosa, MD [facility] 04/29/2023

## 2023-04-29 NOTE — Progress Notes (Signed)
 PULMONOLOGY         Date: 04/29/2023,   MRN# [id number] Justin Reid August 29, 1980     AdmissionWeight: (!) 145.2 kg                 CurrentWeight: (!) 145.2 kg  Referring provider: Dr Murrel Arnt    CHIEF COMPLAINT:   Acute exacerbation of asthma   HISTORY OF PRESENT ILLNESS   This is a 43 yo M with hx of smoking, substance abuse, shingles, obesity, vocal cord dysfunction, OSA and moderate persistent atopic asthma.  He came in with complaints of worsening cough, wheezing chest tightness and dyspnea at rest. He was tachypneic on admission and required BIPAP support.  He received magnesium  infusion and solumedrol with nebulizer therapy. Ive seen him in the past for similar presentation of AE of asthma and we were supposed to follow up with him in outpatient but he was hospitalized again. His blood gas showed respiratory  alkalosis due to hyperventilation. PCCM consultation for further evaluation and management due to recurrent admission with acute asthma exacerbation.    04/29/23- patient improved and is being dcd home today with follow up on outpatient basis.   PAST MEDICAL HISTORY   Past Medical History:  Diagnosis Date   2019-nCoV vaccination declined 11/10/2020   Arthritis    Asthma    Hypertension    Morbid obesity (HCC)    Shingles    Tobacco use      SURGICAL HISTORY   History reviewed. No pertinent surgical history.   FAMILY HISTORY   Family History  Problem Relation Age of Onset   Hypertension Father      SOCIAL HISTORY   Social History   Tobacco Use   Smoking status: Some Days    Current packs/day: 0.50    Average packs/day: 0.5 packs/day for 5.0 years (2.5 ttl pk-yrs)    Types: Cigarettes   Smokeless tobacco: Never  Vaping Use   Vaping status: Never Used  Substance Use Topics   Alcohol use: Yes   Drug use: No     MEDICATIONS    Home Medication:    Current Medication:  Current Facility-Administered Medications:     acetaminophen  (TYLENOL ) tablet 650 mg, 650 mg, Oral, Q6H PRN, Antoniette Batty T, MD, 650 mg at 04/28/23 1751   albuterol  (PROVENTIL ) (2.5 MG/3ML) 0.083% nebulizer solution 2.5 mg, 2.5 mg, Inhalation, Q4H PRN, Zhang, Ping T, MD, 2.5 mg at 04/28/23 1141   amLODipine  (NORVASC ) tablet 5 mg, 5 mg, Oral, Daily, Jeane Miguel, Ping T, MD, 5 mg at 04/29/23 0825   diphenhydrAMINE  (BENADRYL ) capsule 25 mg, 25 mg, Oral, Q6H PRN, [place], Elizabeth Achieng, NP, 25 mg at 04/28/23 2114   enoxaparin  (LOVENOX ) injection 70 mg, 70 mg, Subcutaneous, Q24H, Hunt, Madison H, RPH, 70 mg at 04/28/23 2114   fluticasone  furoate-vilanterol (BREO ELLIPTA ) 200-25 MCG/ACT 1 puff, 1 puff, Inhalation, Daily, Zhang, Ping T, MD, 1 puff at 04/29/23 4098   furosemide  (LASIX ) tablet 40 mg, 40 mg, Oral, Daily, Lorita Rosa, MD, 40 mg at 04/29/23 0825   guaiFENesin -dextromethorphan  (ROBITUSSIN DM) 100-10 MG/5ML syrup 10 mL, 10 mL, Oral, Q6H PRN, Antoniette Batty T, MD   ipratropium-albuterol  (DUONEB) 0.5-2.5 (3) MG/3ML nebulizer solution 3 mL, 3 mL, Nebulization, TID, Lorita Rosa, MD, 3 mL at 04/29/23 0807   loratadine  (CLARITIN ) tablet 10 mg, 10 mg, Oral, Daily, Antoniette Batty T, MD, 10 mg at 04/29/23 0825   metoprolol  succinate (TOPROL -XL) 24 hr tablet 50 mg, 50 mg, Oral,  Daily, Antoniette Batty T, MD, 50 mg at 04/29/23 2952   montelukast  (SINGULAIR ) tablet 10 mg, 10 mg, Oral, Daily, Antoniette Batty T, MD, 10 mg at 04/28/23 2114   nicotine  (NICODERM CQ  - dosed in mg/24 hours) patch 14 mg, 14 mg, Transdermal, Daily PRN, Zhang, Ping T, MD, 14 mg at 04/28/23 1452   ondansetron  (ZOFRAN ) tablet 4 mg, 4 mg, Oral, Q6H PRN **OR** ondansetron  (ZOFRAN ) injection 4 mg, 4 mg, Intravenous, Q6H PRN, Antoniette Batty T, MD   oxyCODONE  (Oxy IR/ROXICODONE ) immediate release tablet 5 mg, 5 mg, Oral, Q6H PRN, Zhang, Ping T, MD, 5 mg at 04/29/23 0830   pantoprazole  (PROTONIX ) EC tablet 40 mg, 40 mg, Oral, BID, Darus Engels A, DO, 40 mg at 04/29/23 0825   predniSONE  (DELTASONE )  tablet 40 mg, 40 mg, Oral, Q breakfast, Lorita Rosa, MD, 40 mg at 04/29/23 0825   senna-docusate (Senokot-S) tablet 1 tablet, 1 tablet, Oral, QHS PRN, [place], MD    ALLERGIES   Patient has no known allergies.     REVIEW OF SYSTEMS    Review of Systems:  Gen:  Denies  fever, sweats, chills weigh loss  HEENT: Denies blurred vision, double vision, ear pain, eye pain, hearing loss, nose bleeds, sore throat Cardiac:  No dizziness, chest pain or heaviness, chest tightness,edema Resp:   reports dyspnea chronically  Gi: Denies swallowing difficulty, stomach pain, nausea or vomiting, diarrhea, constipation, bowel incontinence Gu:  Denies bladder incontinence, burning urine Ext:   Denies Joint pain, stiffness or swelling Skin: Denies  skin rash, easy bruising or bleeding or hives Endoc:  Denies polyuria, polydipsia , polyphagia or weight change Psych:   Denies depression, insomnia or hallucinations   Other:  All other systems negative   VS: BP 120/79 (BP Location: Left Arm)   Pulse 77   Temp 97.8 F (36.6 C) (Oral)   Resp 17   Ht 5\' 9"  (1.753 m)   Wt (!) 145.2 kg   SpO2 98%   BMI 47.26 kg/m      PHYSICAL EXAM    GENERAL:NAD, no fevers, chills, no weakness no fatigue HEAD: Normocephalic, atraumatic.  EYES: Pupils equal, round, reactive to light. Extraocular muscles intact. No scleral icterus.  MOUTH: Moist mucosal membrane. Dentition intact. No abscess noted.  EAR, NOSE, THROAT: Clear without exudates. No external lesions.  NECK: Supple. No thyromegaly. No nodules. No JVD.  PULMONARY: decreased breath sounds with mild rhonchi worse at bases bilaterally.  CARDIOVASCULAR: S1 and S2. Regular rate and rhythm. No murmurs, rubs, or gallops. No edema. Pedal pulses 2+ bilaterally.  GASTROINTESTINAL: Soft, nontender, nondistended. No masses. Positive bowel sounds. No hepatosplenomegaly.  MUSCULOSKELETAL: No swelling, clubbing, or edema. Range of motion full in all  extremities.  NEUROLOGIC: Cranial nerves II through XII are intact. No gross focal neurological deficits. Sensation intact. Reflexes intact.  SKIN: No ulceration, lesions, rashes, or cyanosis. Skin warm and dry. Turgor intact.  PSYCHIATRIC: Mood, affect within normal limits. The patient is awake, alert and oriented x 3. Insight, judgment intact.       IMAGING   Narrative & Impression  CLINICAL DATA:  Wheezing and shortness of breath   EXAM: CT ANGIOGRAPHY CHEST WITH CONTRAST   TECHNIQUE: Multidetector CT imaging of the chest was performed using the standard protocol during bolus administration of intravenous contrast. Multiplanar CT image reconstructions and MIPs were obtained to evaluate the vascular anatomy.   RADIATION DOSE REDUCTION: This exam was performed according to the departmental dose-optimization program which  includes automated exposure control, adjustment of the mA and/or kV according to patient size and/or use of iterative reconstruction technique.   CONTRAST:  OMNIPAQUE  IOHEXOL  350 MG/ML SOLN   COMPARISON:  Same day chest radiograph and CTA chest 02/21/2023   FINDINGS: Cardiovascular: Negative for acute pulmonary embolism. Normal caliber thoracic aorta. Aberrant right subclavian artery. No pericardial effusion.   Mediastinum/Nodes: Trachea and esophagus are unremarkable. No thoracic adenopathy   Lungs/Pleura: No focal consolidation, pleural effusion, or pneumothorax.   Upper Abdomen: No acute abnormality.   Musculoskeletal: No acute fracture.   Review of the MIP images confirms the above findings.   IMPRESSION: Negative for acute pulmonary embolism. No acute abnormality in the chest.     Electronically Signed   By: Rozell Cornet M.D.   On: 04/02/2023 02:04    ASSESSMENT/PLAN   Acute exacerbation of asthma  - patient with steroid dependent asthma with OSA overlap  - currently on steroids, singulair , diphenydramin, claritin ,  robitussin, LABA/LAMA/ICS  - continue current scope of therapy  - likely needs to be started on Dupulimab injections due to refactory asthma with steroid dependence on maximal medical therapy and recurrent admissions to hospital  - absolutely needs some assistance with smoking cessation   - lifestyle modification for weight management  - complicated by OSA    Tobacco dependence    - smoking cessation - consider chantix/wellbutrin - counseling provided for smoking cessation - outpatient follow up    Obesity - morbid with comorbidities   - significant impact on asthma severity    - recommendation for weight reduction with ozempic/mounjaro/wygovi/zepbound   Physical deconditioning - increase cardiovascular activity and use incentive spirometry at this time to reduce atelectatic changes and recruit lungs  OSA    - continue CPAP, optimize interface for comfort  Outpatient follow up        Thank you for allowing me to participate in the care of this patient.   Patient/Family are satisfied with care plan and all questions have been answered.    Provider disclosure: Patient with at least one acute or chronic illness or injury that poses a threat to life or bodily function and is being managed actively during this encounter.  All of the below services have been performed independently by signing provider:  review of prior documentation from internal and or external health records.  Review of previous and current lab results.  Interview and comprehensive assessment during patient visit today. Review of current and previous chest radiographs/CT scans. Discussion of management and test interpretation with health care team and patient/family.   This document was prepared using Dragon voice recognition software and may include unintentional dictation errors.     Syrianna Schillaci, M.D.  Division of Pulmonary & Critical Care Medicine

## 2023-04-29 NOTE — Plan of Care (Signed)

## 2023-05-06 ENCOUNTER — Ambulatory Visit: Payer: Self-pay | Admitting: Family Medicine

## 2023-05-12 ENCOUNTER — Other Ambulatory Visit: Payer: Self-pay

## 2023-05-12 ENCOUNTER — Observation Stay
Admission: EM | Admit: 2023-05-12 | Discharge: 2023-05-13 | Disposition: A | Discharge status: Home / Self Care | Payer: MEDICAID | Attending: Hospitalist | Admitting: Hospitalist

## 2023-05-12 ENCOUNTER — Emergency Department: Payer: MEDICAID

## 2023-05-12 DIAGNOSIS — J4551 Severe persistent asthma with (acute) exacerbation: Principal | ICD-10-CM | POA: Insufficient documentation

## 2023-05-12 DIAGNOSIS — R0602 Shortness of breath: Secondary | ICD-10-CM

## 2023-05-12 DIAGNOSIS — G8929 Other chronic pain: Secondary | ICD-10-CM | POA: Insufficient documentation

## 2023-05-12 DIAGNOSIS — Z6841 Body Mass Index (BMI) 40.0 and over, adult: Secondary | ICD-10-CM | POA: Insufficient documentation

## 2023-05-12 DIAGNOSIS — M7989 Other specified soft tissue disorders: Secondary | ICD-10-CM | POA: Insufficient documentation

## 2023-05-12 DIAGNOSIS — G4733 Obstructive sleep apnea (adult) (pediatric): Secondary | ICD-10-CM | POA: Insufficient documentation

## 2023-05-12 DIAGNOSIS — M549 Dorsalgia, unspecified: Secondary | ICD-10-CM | POA: Insufficient documentation

## 2023-05-12 DIAGNOSIS — J45901 Unspecified asthma with (acute) exacerbation: Secondary | ICD-10-CM | POA: Diagnosis not present

## 2023-05-12 DIAGNOSIS — I10 Essential (primary) hypertension: Secondary | ICD-10-CM | POA: Insufficient documentation

## 2023-05-12 DIAGNOSIS — F17210 Nicotine dependence, cigarettes, uncomplicated: Secondary | ICD-10-CM | POA: Insufficient documentation

## 2023-05-12 DIAGNOSIS — Z1152 Encounter for screening for COVID-19: Secondary | ICD-10-CM | POA: Insufficient documentation

## 2023-05-12 LAB — RESP PANEL BY RT-PCR (RSV, FLU A&B, COVID)  RVPGX2
Influenza A by PCR: NEGATIVE
Influenza B by PCR: NEGATIVE
Resp Syncytial Virus by PCR: NEGATIVE
SARS Coronavirus 2 by RT PCR: NEGATIVE

## 2023-05-12 LAB — BASIC METABOLIC PANEL WITH GFR
Anion gap: 8 (ref 5–15)
BUN: 13 mg/dL (ref 6–20)
CO2: 27 mmol/L (ref 22–32)
Calcium: 8.9 mg/dL (ref 8.9–10.3)
Chloride: 103 mmol/L (ref 98–111)
Creatinine, Ser: 0.88 mg/dL (ref 0.61–1.24)
GFR, Estimated: >60 mL/min (ref >60)
Glucose, Bld: 100 mg/dL — ABNORMAL HIGH (ref 70–99)
Potassium: 3.5 mmol/L (ref 3.5–5.1)
Sodium: 138 mmol/L (ref 135–145)

## 2023-05-12 LAB — CBC WITH DIFFERENTIAL/PLATELET
Abs Immature Granulocytes: 0.02 10*3/uL (ref 0.00–0.07)
Basophils Absolute: 0 10*3/uL (ref 0.0–0.1)
Basophils Relative: 1 %
Eosinophils Absolute: 0.2 10*3/uL (ref 0.0–0.5)
Eosinophils Relative: 3 %
HCT: 39 % (ref 39.0–52.0)
Hemoglobin: 12.6 g/dL — ABNORMAL LOW (ref 13.0–17.0)
Immature Granulocytes: 0 %
Lymphocytes Relative: 33 %
Lymphs Abs: 1.9 10*3/uL (ref 0.7–4.0)
MCH: 27.8 pg (ref 26.0–34.0)
MCHC: 32.3 g/dL (ref 30.0–36.0)
MCV: 85.9 fL (ref 80.0–100.0)
Monocytes Absolute: 0.3 10*3/uL (ref 0.1–1.0)
Monocytes Relative: 6 %
Neutro Abs: 3.3 10*3/uL (ref 1.7–7.7)
Neutrophils Relative %: 57 %
Platelets: 338 10*3/uL (ref 150–400)
RBC: 4.54 MIL/uL (ref 4.22–5.81)
RDW: 14.6 % (ref 11.5–15.5)
WBC: 5.8 10*3/uL (ref 4.0–10.5)
nRBC: 0 % (ref 0.0–0.2)

## 2023-05-12 LAB — BLOOD GAS, VENOUS
Acid-Base Excess: 4.2 mmol/L — ABNORMAL HIGH (ref 0.0–2.0)
Bicarbonate: 27.3 mmol/L (ref 20.0–28.0)
O2 Saturation: 60.5 %
Patient temperature: 37
pCO2, Ven: 35 mmHg — ABNORMAL LOW (ref 44–60)
pH, Ven: 7.5 — ABNORMAL HIGH (ref 7.25–7.43)
pO2, Ven: 34 mmHg (ref 32–45)

## 2023-05-12 LAB — MAGNESIUM: Magnesium: 2.1 mg/dL (ref 1.7–2.4)

## 2023-05-12 LAB — TROPONIN I (HIGH SENSITIVITY)
Troponin I (High Sensitivity): 5 ng/L (ref <18)
Troponin I (High Sensitivity): 4 ng/L (ref <18)

## 2023-05-12 LAB — BRAIN NATRIURETIC PEPTIDE: B Natriuretic Peptide: 18.4 pg/mL (ref 0.0–100.0)

## 2023-05-12 MED ORDER — MONTELUKAST SODIUM 10 MG PO TABS
10.0000 mg | ORAL_TABLET | Freq: Every day | ORAL | Status: DC
  Administered 2023-05-12 – 2023-05-13 (×2): 10 mg via ORAL
  Filled 2023-05-12 (×2): qty 1

## 2023-05-12 MED ORDER — AMLODIPINE BESYLATE 5 MG PO TABS
5.0000 mg | ORAL_TABLET | Freq: Every day | ORAL | Status: DC
  Administered 2023-05-12 – 2023-05-13 (×2): 5 mg via ORAL
  Filled 2023-05-12 (×2): qty 1

## 2023-05-12 MED ORDER — METOPROLOL SUCCINATE ER 50 MG PO TB24
50.0000 mg | ORAL_TABLET | Freq: Every day | ORAL | Status: DC
  Administered 2023-05-12 – 2023-05-13 (×2): 50 mg via ORAL
  Filled 2023-05-12 (×2): qty 1

## 2023-05-12 MED ORDER — ENOXAPARIN SODIUM 80 MG/0.8ML IJ SOSY
0.5000 mg/kg | PREFILLED_SYRINGE | INTRAMUSCULAR | Status: DC
  Administered 2023-05-12: 70 mg via SUBCUTANEOUS
  Filled 2023-05-12 (×2): qty 0.7

## 2023-05-12 MED ORDER — IPRATROPIUM-ALBUTEROL 0.5-2.5 (3) MG/3ML IN SOLN
3.0000 mL | Freq: Once | RESPIRATORY_TRACT | Status: AC
  Administered 2023-05-12: 3 mL via RESPIRATORY_TRACT

## 2023-05-12 MED ORDER — OXYCODONE HCL 5 MG PO TABS
5.0000 mg | ORAL_TABLET | Freq: Once | ORAL | Status: AC
  Administered 2023-05-12: 5 mg via ORAL
  Filled 2023-05-12: qty 1

## 2023-05-12 MED ORDER — NICOTINE 21 MG/24HR TD PT24
21.0000 mg | MEDICATED_PATCH | Freq: Every day | TRANSDERMAL | Status: DC
  Administered 2023-05-12: 21 mg via TRANSDERMAL
  Filled 2023-05-12: qty 1

## 2023-05-12 MED ORDER — IPRATROPIUM-ALBUTEROL 0.5-2.5 (3) MG/3ML IN SOLN
RESPIRATORY_TRACT | Status: AC
  Filled 2023-05-12: qty 3

## 2023-05-12 MED ORDER — ONDANSETRON HCL 4 MG/2ML IJ SOLN: 4.0000 mg | Freq: Four times a day (QID) | INTRAMUSCULAR | Status: DC | PRN

## 2023-05-12 MED ORDER — DOCUSATE SODIUM 100 MG PO CAPS: 100.0000 mg | ORAL_CAPSULE | Freq: Two times a day (BID) | ORAL | Status: DC | PRN

## 2023-05-12 MED ORDER — FLUTICASONE FUROATE-VILANTEROL 200-25 MCG/ACT IN AEPB
1.0000 | INHALATION_SPRAY | Freq: Every day | RESPIRATORY_TRACT | Status: DC
  Administered 2023-05-13: 1 via RESPIRATORY_TRACT
  Filled 2023-05-12: qty 28

## 2023-05-12 MED ORDER — IPRATROPIUM-ALBUTEROL 0.5-2.5 (3) MG/3ML IN SOLN
3.0000 mL | Freq: Once | RESPIRATORY_TRACT | Status: AC
  Administered 2023-05-12: 3 mL via RESPIRATORY_TRACT
  Filled 2023-05-12: qty 3

## 2023-05-12 MED ORDER — PANTOPRAZOLE SODIUM 40 MG PO TBEC
40.0000 mg | DELAYED_RELEASE_TABLET | Freq: Every day | ORAL | Status: DC
  Administered 2023-05-12 – 2023-05-13 (×2): 40 mg via ORAL
  Filled 2023-05-12 (×2): qty 1

## 2023-05-12 MED ORDER — MELATONIN 5 MG PO TABS
5.0000 mg | ORAL_TABLET | Freq: Every evening | ORAL | Status: DC | PRN
  Administered 2023-05-12: 5 mg via ORAL
  Filled 2023-05-12: qty 1

## 2023-05-12 MED ORDER — ACETAMINOPHEN 500 MG PO TABS
1000.0000 mg | ORAL_TABLET | Freq: Three times a day (TID) | ORAL | Status: DC | PRN
  Administered 2023-05-12 – 2023-05-13 (×3): 1000 mg via ORAL
  Filled 2023-05-12 (×3): qty 2

## 2023-05-12 MED ORDER — METHYLPREDNISOLONE SODIUM SUCC 125 MG IJ SOLR
125.0000 mg | Freq: Once | INTRAMUSCULAR | Status: AC
  Administered 2023-05-12: 125 mg via INTRAVENOUS
  Filled 2023-05-12: qty 2

## 2023-05-12 MED ORDER — FUROSEMIDE 40 MG PO TABS
40.0000 mg | ORAL_TABLET | Freq: Every day | ORAL | Status: DC
  Administered 2023-05-12 – 2023-05-13 (×2): 40 mg via ORAL
  Filled 2023-05-12 (×2): qty 1

## 2023-05-12 MED ORDER — IPRATROPIUM-ALBUTEROL 0.5-2.5 (3) MG/3ML IN SOLN
3.0000 mL | Freq: Three times a day (TID) | RESPIRATORY_TRACT | Status: DC
  Administered 2023-05-12: 3 mL via RESPIRATORY_TRACT
  Filled 2023-05-12: qty 3

## 2023-05-12 NOTE — H&P (Signed)
 History and Physical    Justin Reid ZOX:[id number] DOB: 1980/10/11 DOA: 05/12/2023  PCP: Pcp, No  Patient coming from: home  I have personally briefly reviewed patient's old medical records in [facility]  Chief Complaint: shortness of breath  HPI: Justin Reid is a 43 y.o. male with medical history significant of asthma, HTN, morbid obesity, OSA (not on CPAP at home), frequent hospitalizations for asthma exacerbation who presented with shortness of breath.  Pt reported dyspnea started last night.  No fever.  Also complained of pain and swelling in his right leg that has been present for several days.  Pt said he is compliant with his home daily bronchodilators, not currently on steroid.  Reported back pain.  Normal oral intake and urination.  Pt said he quit smoking about a week ago.  Girlfriend said pt has sleep apnea and pt has outpatient sleep studies scheduled for next month.   ED Course: initial vitals: afebrile, pulse 84, BP 148/104, RR 22, sating 96% on room air.  However, most of the 15 min interval RR charted in the flowsheet were under 20.  Labs unremarkable.  VBG ph 7.5, pCO2 35.  CXR clear.  Pt received IV solumedrol 125 mg and DuoNeb x3 but reportedly still was in respiratory distress, therefore ED provider requested admission.     Assessment/Plan  Dyspnea Hx of asthma with frequent admissions for exacerbation --No hypoxia on room air, no wheezing from the lungs, no tachycardia, did not appear to be in respiratory distress during admission.  Most of the RR charted during 15 min intervals were under 20.  Does not appear to be in asthma exacerbation. --pt had 4 hospitalizations in April 2025 for asthma exacerbation, and was last discharged on 04/29/23. Plan: --hold off further steroid for now --cont home bronchodilators --DuoNeb TID --monitor  HTN --cont home amlodipine , lasix  and Toprol   OSA --self-reported OSA, but currently does not have CPAP at home.  Has  upcoming outpatient sleep study next month. --order CPAP nightly while inpatient.  Morbid obesity, BMI 45.78  Cigarette smoking --pt reported he quit about a week ago. --nicotine  patch  Chronic back and knee pain --likely due to body habitus.  Does not have outpatient opioids Rx, so will not order here. --Tylenol  1g PRN.   DVT prophylaxis: Lovenox  SQ Code Status: Full code  Family Communication: significant other updated at bedside on admission  Disposition Plan: home  Consults called: none Level of care: Med-Surg   Review of Systems: As per HPI otherwise complete review of systems negative.   Past Medical History:  Diagnosis Date   2019-nCoV vaccination declined 11/10/2020   Arthritis    Asthma    Hypertension    Morbid obesity (HCC)    Shingles    Tobacco use     History reviewed. No pertinent surgical history.   reports that he has been smoking cigarettes. He has a 2.5 pack-year smoking history. He has never used smokeless tobacco. He reports current alcohol use. He reports that he does not use drugs.  No Known Allergies  Family History  Problem Relation Age of Onset   Hypertension Father      Prior to Admission medications   Medication Sig Start Date End Date Taking? Authorizing Provider  acetaminophen  (TYLENOL ) 325 MG tablet Take 2 tablets (650 mg total) by mouth every 6 (six) hours as needed for mild pain (pain score 1-3) or fever. 04/06/23   Alexander, Natalie, DO  albuterol  (VENTOLIN  HFA) 108 (  90 Base) MCG/ACT inhaler Inhale 2 puffs into the lungs every 4 (four) hours as needed for wheezing or shortness of breath. 04/22/23   Tiajuana Fluke, MD  amLODipine  (NORVASC ) 5 MG tablet Take 1 tablet (5 mg total) by mouth daily. 04/22/23 06/21/23  Tiajuana Fluke, MD  Dextromethorphan -guaiFENesin  20-200 MG/20ML LIQD Take 10 mLs by mouth every 6 (six) hours as needed. 04/06/23   Alexander, Natalie, DO  fluticasone -salmeterol (WIXELA INHUB ) 250-50 MCG/ACT AEPB  Inhale 1 puff into the lungs in the morning and at bedtime. 04/22/23 06/21/23  Tiajuana Fluke, MD  furosemide  (LASIX ) 40 MG tablet Take 1 tablet (40 mg total) by mouth daily. 04/30/23   Lorita Rosa, MD  loratadine  (CLARITIN ) 10 MG tablet Take 1 tablet (10 mg total) by mouth daily. 04/06/23   Alexander, Natalie, DO  metoprolol  succinate (TOPROL -XL) 50 MG 24 hr tablet Take 1 tablet (50 mg total) by mouth daily. Take with or immediately following a meal. 04/22/23 06/21/23  Margery Sheets B, MD  montelukast  (SINGULAIR ) 10 MG tablet Take 1 tablet (10 mg total) by mouth daily. 04/06/23   Alexander, Natalie, DO  nicotine  (NICODERM CQ  - DOSED IN MG/24 HOURS) 14 mg/24hr patch Place 1 patch (14 mg total) onto the skin daily as needed (nicotine  craving). 04/22/23   Tiajuana Fluke, MD  pantoprazole  (PROTONIX ) 40 MG tablet Take 1 tablet (40 mg total) by mouth daily. 04/22/23 06/21/23  Tiajuana Fluke, MD    Physical Exam: Vitals:   05/12/23 1424 05/12/23 1430  BP: (!) 148/104   Pulse: 84   Resp: (!) 22   Temp: 98.4 F (36.9 C)   TempSrc: Oral   SpO2: 96%   Weight:  (!) 140.6 kg  Height:  5\' 9"  (1.753 m)    Constitutional: NAD, AAOx3 HEENT: conjunctivae and lids normal, EOMI CV: No cyanosis.   RESP: normal respiratory effort, normal RR, no wheezing in all lung fields, on RA sating 100% Extremities: some edema in BLE SKIN: warm, dry Neuro: II - XII grossly intact.     Labs on Admission: I have personally reviewed labs and imaging studies  Time spent: 65 minutes  Garrison Kanner MD [facility]  If 7PM-7AM, please contact night-coverage 05/12/2023, 5:35 PM

## 2023-05-12 NOTE — ED Triage Notes (Signed)
 Patient states shortness of breath since last night; no relief with neb treatments at home.

## 2023-05-12 NOTE — ED Notes (Addendum)
 Pt took self off Bipap and started to walk down the hall. Pt sts that he needs to use the rest room. RN explained that there was a toilet in the room and pt sts " I would like to use the actual bathroom."

## 2023-05-12 NOTE — ED Notes (Signed)
 Student nurse contacted Justin Reid and given report pt coming from triage/xray to bed 9

## 2023-05-12 NOTE — ED Provider Notes (Signed)
 [facility] Provider Note    Event Date/Time   First MD Initiated Contact with Patient 05/12/23 1513     (approximate)   History   Shortness of Breath   HPI  Justin Reid is a 43 y.o. male with history of asthma who comes in with concerns for asthma exacerbation.  On review of records patient's had multiple admissions for his asthma.  He reports that his shortness of breath started yesterday.  He denies any current steroid use.  He reports taking a DuoNeb in triage without significant improvement.  He states that this feels very similar to his prior admissions.  I reviewed the discharge note from 04/29/2023 where patient had been placed on BiPAP for his asthma.  He did have recent ultrasounds that were negative for DVTs.  He does report that his swelling in his legs has gone worse.  On review of records he had a CT PE on 04/02/2023      Physical Exam   Triage Vital Signs: ED Triage Vitals  Encounter Vitals Group     BP 05/12/23 1424 (!) 148/104     Systolic BP Percentile --      Diastolic BP Percentile --      Pulse Rate 05/12/23 1424 84     Resp 05/12/23 1424 (!) 22     Temp 05/12/23 1424 98.4 F (36.9 C)     Temp Source 05/12/23 1424 Oral     SpO2 05/12/23 1424 96 %     Weight 05/12/23 1430 (!) 310 lb (140.6 kg)     Height 05/12/23 1430 5\' 9"  (1.753 m)     Head Circumference --      Peak Flow --      Pain Score 05/12/23 1430 10     Pain Loc --      Pain Education --      Exclude from Growth Chart --     Most recent vital signs: Vitals:   05/12/23 1424  BP: (!) 148/104  Pulse: 84  Resp: (!) 22  Temp: 98.4 F (36.9 C)  SpO2: 96%     General: Awake, no distress.  CV:  Good peripheral perfusion.  Resp:  Increased work of breathing with tight air exchange and audible wheezing Abd:  No distention.  Other:  Slight swelling redness.   ED Results / Procedures / Treatments   Labs (all labs ordered are listed, but only abnormal  results are displayed) Labs Reviewed  CBC WITH DIFFERENTIAL/PLATELET - Abnormal; Notable for the following components:      Result Value   Hemoglobin 12.6 (*)    All other components within normal limits  BASIC METABOLIC PANEL WITH GFR     EKG  My interpretation of EKG:  Sinus tachycardia rate of 108 without any ST elevation or T wave inversions, normal intervals  RADIOLOGY I have reviewed the xray personally and interpreted no evidence of any pneumonia   PROCEDURES:  Critical Care performed: Yes, see critical care procedure note(s)  .1-3 Lead EKG Interpretation  Performed by: Lubertha Rush, MD Authorized by: Lubertha Rush, MD     Interpretation: normal     ECG rate:  80   ECG rate assessment: normal     Rhythm: sinus rhythm     Ectopy: none     Conduction: normal   .Critical Care  Performed by: Lubertha Rush, MD Authorized by: Lubertha Rush, MD   Critical care provider statement:  Critical care time (minutes):  30   Critical care was necessary to treat or prevent imminent or life-threatening deterioration of the following conditions:  Respiratory failure   Critical care was time spent personally by me on the following activities:  Development of treatment plan with patient or surrogate, discussions with consultants, evaluation of patient's response to treatment, examination of patient, ordering and review of laboratory studies, ordering and review of radiographic studies, ordering and performing treatments and interventions, pulse oximetry, re-evaluation of patient's condition and review of old charts    MEDICATIONS ORDERED IN ED: Medications  ipratropium-albuterol  (DUONEB) 0.5-2.5 (3) MG/3ML nebulizer solution 3 mL (3 mLs Nebulization Given 05/12/23 1436)  ipratropium-albuterol  (DUONEB) 0.5-2.5 (3) MG/3ML nebulizer solution 3 mL (3 mLs Nebulization Given 05/12/23 1539)  ipratropium-albuterol  (DUONEB) 0.5-2.5 (3) MG/3ML nebulizer solution 3 mL (3 mLs Nebulization Given  05/12/23 1538)  methylPREDNISolone  sodium succinate (SOLU-MEDROL ) 125 mg/2 mL injection 125 mg (125 mg Intravenous Given 05/12/23 1541)     IMPRESSION / MDM / ASSESSMENT AND PLAN / ED COURSE  I reviewed the triage vital signs and the nursing notes.   Patient's presentation is most consistent with acute presentation with potential threat to life or bodily function.   Patient was given a DuoNeb out from but still significantly tachypneic into the 30s with audible wheezing.  Patient's tolerated BiPAP previously therefore will place on BiPAP.  Will give some Solu-Medrol .  Chest x-ray without any evidence of pneumonia, pneumothorax.  VBG shows no significant hypercapnia.  CBC shows no white count elevation BMP reassuring.  Given patient worsened reports some worsening swelling in his legs I will get a BNP to evaluate for CHF and repeat ultrasound to evaluate for DVT.  Would be in need to be admitted to the hospital for recurrent asthma exacerbation.  DVT us  pending at time of dispo.    The patient is on the cardiac monitor to evaluate for evidence of arrhythmia and/or significant heart rate changes.      FINAL CLINICAL IMPRESSION(S) / ED DIAGNOSES   Final diagnoses:  Severe asthma with exacerbation, unspecified whether persistent     Rx / DC Orders   ED Discharge Orders     None        Note:  This document was prepared using Dragon voice recognition software and may include unintentional dictation errors.   Lubertha Rush, MD 05/12/23 [id number]

## 2023-05-12 NOTE — Progress Notes (Signed)
 PHARMACIST - PHYSICIAN COMMUNICATION  CONCERNING:  Enoxaparin  (Lovenox ) for DVT Prophylaxis   RECOMMENDATION: Patient was prescribed enoxaparin  40mg  q24 hours for VTE prophylaxis.   Filed Weights   05/12/23 1430  Weight: (!) 140.6 kg (310 lb)   Body mass index is 45.78 kg/m.  Based on [facility] policy patient is candidate for enoxaparin  0.5mg /kg TBW SQ every 24 hours based on BMI being >30.  DESCRIPTION: Pharmacy has adjusted enoxaparin  dose per [facility] policy.  Patient is now receiving enoxaparin  70 mg every 24 hours.   Rosealee Recinos, PharmD Pharmacy Resident  05/12/2023 5:41 PM

## 2023-05-12 NOTE — Progress Notes (Signed)
 Patient arrived to floor via wheelchair with no oxygen . Patient very SOB on arrival. Oxygen  placed via nasal cannula, patient requests bipap. MD messaged as no bipap orders in system.

## 2023-05-13 LAB — D-DIMER, QUANTITATIVE: D-Dimer, Quant: 0.51 ug{FEU}/mL — ABNORMAL HIGH (ref 0.00–0.50)

## 2023-05-13 MED ORDER — IPRATROPIUM-ALBUTEROL 0.5-2.5 (3) MG/3ML IN SOLN: 3.0000 mL | Freq: Four times a day (QID) | RESPIRATORY_TRACT | 2 refills | Status: DC | PRN

## 2023-05-13 MED ORDER — METHYLPREDNISOLONE SODIUM SUCC 40 MG IJ SOLR
40.0000 mg | Freq: Two times a day (BID) | INTRAMUSCULAR | Status: DC
Start: 2023-05-13 — End: 2023-05-13
  Administered 2023-05-13: 40 mg via INTRAVENOUS
  Filled 2023-05-13: qty 1

## 2023-05-13 MED ORDER — IPRATROPIUM-ALBUTEROL 0.5-2.5 (3) MG/3ML IN SOLN
3.0000 mL | RESPIRATORY_TRACT | Status: DC | PRN
  Administered 2023-05-13: 3 mL via RESPIRATORY_TRACT

## 2023-05-13 MED ORDER — IPRATROPIUM-ALBUTEROL 0.5-2.5 (3) MG/3ML IN SOLN
3.0000 mL | Freq: Four times a day (QID) | RESPIRATORY_TRACT | Status: DC
  Administered 2023-05-13: 3 mL via RESPIRATORY_TRACT
  Filled 2023-05-13 (×2): qty 3

## 2023-05-13 NOTE — Progress Notes (Signed)
   05/13/23 0335  Notify: Charge Nurse/RN  Name of Charge Nurse/RN Notified Adelene Adolf RN  Provider Notification  Provider Name/Title B [place] Byers  Date Provider Notified 05/13/23  Time Provider Notified 0335  Method of Notification Page  Notification Reason Requested by patient/family (pt aking for prn breathing tx, none ordered yet. RT made aware, also.)  Provider response See new orders  Date of Provider Response 05/13/23  Time of Provider Response 0412   Pt had another episode of SOB after getting OOB to BR.  He also had a lot of wheezing, and asked for a prn breathing tx (none were ordered on admission).   Rt at bedside and new orders put in for steroids and D-Dimer.  Pt more comfortable after meds given and placed back on 2L [place] instead of CPAP for now.

## 2023-05-13 NOTE — Progress Notes (Signed)
 Attempted to do bedside spirometry, however patient was too short of breath, provider informed procedure was not done at this time.

## 2023-05-13 NOTE — Progress Notes (Signed)
 Pt came to nurses station and stated that he had to leave due to a family emergency. MD was notified. The writer told the pt that if he feels any symptoms from his admission to come back to the ED. Pt stated that he will. Pt ambulated off the unit showing no s/s of pain or distress.

## 2023-05-13 NOTE — Significant Event (Addendum)
       CROSS COVER NOTE  NAME: Justin Reid MRN: [id number] DOB : January 16, 1980 ATTENDING PHYSICIAN: Justin Kanner, MD    Date of Service   05/13/2023   HPI/Events of Note   Patient with significant wheezing/resp distress Admitted with asthma exacerbation and requiring BIPAP while in ED  HPI 8th admission since January for "asthma exacerbation" in patient with significant smoking history along with asthma  No spirometry results found. Follows with Dr Justin Reid at [facility]. Next appointment 5 months On [facility] for management/ at home, dily pred 20 mg, duonebs and rescue albuterol  + singulair .  CPAP planned never received for home use as of yet.   duonebs only TID here without rescue inhaler  Interventions   Assessment/Plan: Duoneb every 6 + every 4 prn PFTs ordered Per best practice guidelines asthma exacerbation systemic glucocorticoid steroid therapy with 40 mg solumedrol IV BID ordered now, wean when appropriate Ddimer 0.51 Consider pulmonology consult        Justin Peon NP [facility] Cross Cover 7pm-7am - check amion for availability Pager [phone]

## 2023-05-13 NOTE — Plan of Care (Signed)

## 2023-05-13 NOTE — Discharge Summary (Signed)
 Physician Discharge Summary   [name]  male DOB: Dec 24, 1980  MVH:[id number]  PCP: Pcp, No  Admit date: 05/12/2023 Discharge date: 05/13/2023  Admitted From: home Disposition:  home CODE STATUS: Full code  Discharge Instructions     Diet - low sodium heart healthy   Complete by: As directed       Hospital Course:  For full details, please see H&P, progress notes, consult notes and ancillary notes.  Briefly,  Justin Reid is a 43 y.o. male with medical history significant of asthma, HTN, morbid obesity, OSA (not on CPAP at home), frequent hospitalizations for asthma exacerbation who presented with shortness of breath.   Dyspnea Hx of asthma with frequent admissions for exacerbation --CXR clear.  No hypoxia on room air, no wheezing from the lungs (the high-pitched sound came from the anterior chest, no wheezing from all posterior lung fields), no tachycardia, did not appear to be in respiratory distress during admission.  Most of the RR charted during 15 min intervals were under 20.  Does not appear to be in asthma exacerbation.  Pt asked to be on BiPAP, however, able to take BiPAP on and off on his own depending on what he wanted to do.   --the day after admission, pt said he wanted to leave due to a family emergency.  Pt was discharged since he did not appear to have needs for further inpatient stay.  RN noted "Pt ambulated off the unit showing no s/s of pain or distress."   Concern for malingering -d/c summary on 04/29/23 documented concern for malingering, and noted "suspicion of breath holding on ambulatory pulse ox per nursing staff", "TOC team, who reports housing insecurity and significant outpatient noncompliance", "He does not appear to have ongoing need for acute hospitalization but is at high risk for rehospitalization."  Based my above observation, I share this concern.  HTN --cont home amlodipine , lasix  and Toprol    OSA --self-reported OSA, but currently  does not have CPAP at home.  Has upcoming outpatient sleep study next month. --order CPAP nightly while inpatient.   Morbid obesity, BMI 45.78   Cigarette smoking --pt reported he quit about a week ago. --nicotine  patch   Chronic back and knee pain --likely due to body habitus.  Does not have outpatient opioids Rx, so will not order here. --Tylenol  1g PRN.  LE swelling --ED provider ordered US  DVT studies, which was neg for DVT.  Ordered low-sodium diet to reduce swelling.   Discharge Diagnoses:  Principal Problem:   Asthma exacerbation   30 Day Unplanned Readmission Risk Score    Flowsheet Row ED to Hosp-Admission (Discharged) from 04/25/2023 in [facility] (1A)  30 Day Unplanned Readmission Risk Score (%) 46.46 Filed at 04/29/2023 0801       This score is the patient's risk of an unplanned readmission within 30 days of being discharged (0 -100%). The score is based on dignosis, age, lab data, medications, orders, and past utilization.   Low:  0-14.9   Medium: 15-21.9   High: 22-29.9   Extreme: 30 and above         Discharge Instructions:  Allergies as of 05/13/2023   No Known Allergies      Medication List     TAKE these medications    acetaminophen  325 MG tablet Commonly known as: TYLENOL  Take 2 tablets (650 mg total) by mouth every 6 (six) hours as needed for mild pain (pain score 1-3) or  fever.   albuterol  108 (90 Base) MCG/ACT inhaler Commonly known as: VENTOLIN  HFA Inhale 2 puffs into the lungs every 4 (four) hours as needed for wheezing or shortness of breath.   amLODipine  5 MG tablet Commonly known as: NORVASC  Take 1 tablet (5 mg total) by mouth daily.   Dextromethorphan -guaiFENesin  20-200 MG/20ML Liqd Take 10 mLs by mouth every 6 (six) hours as needed.   furosemide  40 MG tablet Commonly known as: LASIX  Take 1 tablet (40 mg total) by mouth daily.   ipratropium-albuterol  0.5-2.5 (3) MG/3ML Soln Commonly known  as: DUONEB Take 3 mLs by nebulization every 6 (six) hours as needed.   loratadine  10 MG tablet Commonly known as: CLARITIN  Take 1 tablet (10 mg total) by mouth daily.   metoprolol  succinate 50 MG 24 hr tablet Commonly known as: TOPROL -XL Take 1 tablet (50 mg total) by mouth daily. Take with or immediately following a meal.   montelukast  10 MG tablet Commonly known as: SINGULAIR  Take 1 tablet (10 mg total) by mouth daily.   nicotine  14 mg/24hr patch Commonly known as: NICODERM CQ  - dosed in mg/24 hours Place 1 patch (14 mg total) onto the skin daily as needed (nicotine  craving).   pantoprazole  40 MG tablet Commonly known as: PROTONIX  Take 1 tablet (40 mg total) by mouth daily.   Wixela Inhub  250-50 MCG/ACT Aepb Generic drug: fluticasone -salmeterol Inhale 1 puff into the lungs in the morning and at bedtime.         Follow-up Information     Erskin Hearing, MD Follow up in 1 week(s).   Specialty: Pulmonary Disease Contact information: [address] [place] [address] [phone]                 No Known Allergies   The results of significant diagnostics from this hospitalization (including imaging, microbiology, ancillary and laboratory) are listed below for reference.   Consultations:   Procedures/Studies: US  Venous Img Lower Bilateral Result Date: 05/12/2023 CLINICAL DATA:  Left leg swelling EXAM: LEFT LOWER EXTREMITY VENOUS DOPPLER ULTRASOUND TECHNIQUE: Gray-scale sonography with compression, as well as color and duplex ultrasound, were performed to evaluate the deep venous system(s) from the level of the common femoral vein through the popliteal and proximal calf veins. COMPARISON:  None Available. FINDINGS: VENOUS Normal compressibility of the common femoral, superficial femoral, and popliteal veins, as well as the visualized calf veins. Visualized portions of profunda femoral vein and great saphenous vein unremarkable. No filling defects to  suggest DVT on grayscale or color Doppler imaging. Doppler waveforms show normal direction of venous flow, normal respiratory plasticity and response to augmentation. Limited views of the contralateral common femoral vein are unremarkable. OTHER Probable moderate left knee joint effusion. Limitations: Peroneal vein in the left calf not visualized. IMPRESSION: No evidence of left lower extremity DVT Left knee joint effusion. Electronically Signed   By: Janeece Mechanic M.D.   On: 05/12/2023 18:49   DG Chest 2 View Result Date: 05/12/2023 CLINICAL DATA:  Shortness of breath. EXAM: CHEST - 2 VIEW COMPARISON:  April 25, 2023. FINDINGS: The heart size and mediastinal contours are within normal limits. Both lungs are clear. The visualized skeletal structures are unremarkable. IMPRESSION: No active cardiopulmonary disease. Electronically Signed   By: Rosalene Colon M.D.   On: 05/12/2023 15:28   CT ABDOMEN PELVIS W CONTRAST Result Date: 04/26/2023 CLINICAL DATA:  Right lower quadrant abdominal pain EXAM: CT ABDOMEN AND PELVIS WITH CONTRAST TECHNIQUE: Multidetector CT imaging of the abdomen  and pelvis was performed using the standard protocol following bolus administration of intravenous contrast. RADIATION DOSE REDUCTION: This exam was performed according to the departmental dose-optimization program which includes automated exposure control, adjustment of the mA and/or kV according to patient size and/or use of iterative reconstruction technique. CONTRAST:  OMNIPAQUE  IOHEXOL  350 MG/ML SOLN COMPARISON:  CT 04/13/2022 FINDINGS: Lower chest: No acute abnormality. Hepatobiliary: Unremarkable liver, gallbladder, and biliary tree. Pancreas: Unremarkable. Spleen: Unremarkable. Adrenals/Urinary Tract: Normal adrenal glands. Low attenuation cyst with peripheral punctate calcification in the left kidney. No follow-up recommended. No urinary calculi or hydronephrosis. Unremarkable bladder. Stomach/Bowel: Normal caliber large  and small bowel. Stomach is within normal limits. No bowel wall thickening. The appendix is normal. Vascular/Lymphatic: No significant vascular findings are present. No enlarged abdominal or pelvic lymph nodes. Reproductive: Unremarkable. Other: No free intraperitoneal fluid or air. Musculoskeletal: No acute fracture. Soft tissue nodules in the ventral abdominal wall subcutaneous fat likely related to injections. IMPRESSION: No acute abnormality in the abdomen or pelvis. Normal appendix. Electronically Signed   By: Rozell Cornet M.D.   On: 04/26/2023 19:54   US  Venous Img Lower Bilateral (DVT) Result Date: 04/26/2023 CLINICAL DATA:  Bilateral lower extremity edema for 2 days EXAM: BILATERAL LOWER EXTREMITY VENOUS DOPPLER ULTRASOUND TECHNIQUE: Gray-scale sonography with graded compression, as well as color Doppler and duplex ultrasound were performed to evaluate the lower extremity deep venous systems from the level of the common femoral vein and including the common femoral, femoral, profunda femoral, popliteal and calf veins including the posterior tibial, peroneal and gastrocnemius veins when visible. The superficial great saphenous vein was also interrogated. Spectral Doppler was utilized to evaluate flow at rest and with distal augmentation maneuvers in the common femoral, femoral and popliteal veins. COMPARISON:  Ultrasound 02/21/2023 FINDINGS: RIGHT LOWER EXTREMITY Common Femoral Vein: No evidence of thrombus. Normal compressibility, respiratory phasicity and response to augmentation. Saphenofemoral Junction: No evidence of thrombus. Normal compressibility and flow on color Doppler imaging. Profunda Femoral Vein: No evidence of thrombus. Normal compressibility and flow on color Doppler imaging. Femoral Vein: No evidence of thrombus. Normal compressibility, respiratory phasicity and response to augmentation. Popliteal Vein: No evidence of thrombus. Normal compressibility, respiratory phasicity and response  to augmentation. Calf Veins: No evidence of thrombus. Normal compressibility and flow on color Doppler imaging. Superficial Great Saphenous Vein: No evidence of thrombus. Normal compressibility. Venous Reflux:  None. Other Findings: There is a oblong fluid collection identified without flow on Doppler lateral to the knee measuring 7.6 x 2.4 x 7.0 cm. LEFT LOWER EXTREMITY Common Femoral Vein: No evidence of thrombus. Normal compressibility, respiratory phasicity and response to augmentation. Saphenofemoral Junction: No evidence of thrombus. Normal compressibility and flow on color Doppler imaging. Profunda Femoral Vein: No evidence of thrombus. Normal compressibility and flow on color Doppler imaging. Femoral Vein: No evidence of thrombus. Normal compressibility, respiratory phasicity and response to augmentation. Popliteal Vein: No evidence of thrombus. Normal compressibility, respiratory phasicity and response to augmentation. Calf Veins: No evidence of thrombus. Normal compressibility and flow on color Doppler imaging. Superficial Great Saphenous Vein: No evidence of thrombus. Normal compressibility. Venous Reflux:  None. Other Findings: Oblong fluid collection lateral to the knee identified measuring 8.3 x 2.2 x 12.6 cm. IMPRESSION: No evidence of bilateral lower extremity DVT. There or fluid collections lateral to both knees measuring up to 7.6 cm on the right and 8.3 x 12.6 cm on the left. Please correlate for any history specifically including trauma or other process. Otherwise recommend  further evaluation such as CT or MRI when clinically appropriate to better define these fluid collections. They have a broad differential. These were not seen on the prior study of February 2025. Electronically Signed   By: Adrianna Horde M.D.   On: 04/26/2023 16:34   DG Chest Port 1 View Result Date: 04/25/2023 CLINICAL DATA:  Shortness of breath. EXAM: PORTABLE CHEST 1 VIEW COMPARISON:  04/19/2023. FINDINGS: Low lung  volumes with associated accentuation of the cardiac silhouette. Mediastinal contours are within normal limits. No focal consolidation, sizeable pleural effusion, or pneumothorax. Visualized osseous structures are unchanged. IMPRESSION: Low lung volumes.  No acute cardiopulmonary findings. Electronically Signed   By: Mannie Seek M.D.   On: 04/25/2023 17:47   DG Chest 2 View Result Date: 04/19/2023 CLINICAL DATA:  cp, sob EXAM: CHEST - 2 VIEW COMPARISON:  04/09/2023 FINDINGS: Lungs are clear.  No pneumothorax. Heart size and mediastinal contours are within normal limits. No effusion. Possible free osteochondral bodies in the left shoulder. IMPRESSION: No acute cardiopulmonary disease. Electronically Signed   By: Nicoletta Barrier M.D.   On: 04/19/2023 17:54      Labs: BNP (last 3 results) Recent Labs    04/01/23 1610 04/09/23 1552 05/12/23 1449  BNP 11.4 4.5 18.4   Basic Metabolic Panel: Recent Labs  Lab 05/12/23 1449 05/12/23 1538  NA 138  --   K 3.5  --   CL 103  --   CO2 27  --   GLUCOSE 100*  --   BUN 13  --   CREATININE 0.88  --   CALCIUM 8.9  --   MG  --  2.1   Liver Function Tests: No results for input(s): "AST", "ALT", "ALKPHOS", "BILITOT", "PROT", "ALBUMIN" in the last 168 hours. No results for input(s): "LIPASE", "AMYLASE" in the last 168 hours. No results for input(s): "AMMONIA" in the last 168 hours. CBC: Recent Labs  Lab 05/12/23 1449  WBC 5.8  NEUTROABS 3.3  HGB 12.6*  HCT 39.0  MCV 85.9  PLT 338   Cardiac Enzymes: No results for input(s): "CKTOTAL", "CKMB", "CKMBINDEX", "TROPONINI" in the last 168 hours. BNP: Invalid input(s): "POCBNP" CBG: No results for input(s): "GLUCAP" in the last 168 hours. D-Dimer Recent Labs    05/13/23 0522  DDIMER 0.51*   Hgb A1c No results for input(s): "HGBA1C" in the last 72 hours. Lipid Profile No results for input(s): "CHOL", "HDL", "LDLCALC", "TRIG", "CHOLHDL", "LDLDIRECT" in the last 72 hours. Thyroid  function  studies No results for input(s): "TSH", "T4TOTAL", "T3FREE", "THYROIDAB" in the last 72 hours.  Invalid input(s): "FREET3" Anemia work up No results for input(s): "VITAMINB12", "FOLATE", "FERRITIN", "TIBC", "IRON", "RETICCTPCT" in the last 72 hours. Urinalysis No results found for: "COLORURINE", "APPEARANCEUR", "LABSPEC", "PHURINE", "GLUCOSEU", "HGBUR", "BILIRUBINUR", "KETONESUR", "PROTEINUR", "UROBILINOGEN", "NITRITE", "LEUKOCYTESUR" Sepsis Labs Recent Labs  Lab 05/12/23 1449  WBC 5.8   Microbiology Recent Results (from the past 240 hours)  Resp panel by RT-PCR (RSV, Flu A&B, Covid) Anterior Nasal Swab     Status: None   Collection Time: 05/12/23  5:33 PM   Specimen: Anterior Nasal Swab  Result Value Ref Range Status   SARS Coronavirus 2 by RT PCR NEGATIVE NEGATIVE Final    Comment: (NOTE) SARS-CoV-2 target nucleic acids are NOT DETECTED.  The SARS-CoV-2 RNA is generally detectable in upper respiratory specimens during the acute phase of infection. The lowest concentration of SARS-CoV-2 viral copies this assay can detect is 138 copies/mL. A negative result does not preclude SARS-Cov-2  infection and should not be used as the sole basis for treatment or other patient management decisions. A negative result may occur with  improper specimen collection/handling, submission of specimen other than nasopharyngeal swab, presence of viral mutation(s) within the areas targeted by this assay, and inadequate number of viral copies(<138 copies/mL). A negative result must be combined with clinical observations, patient history, and epidemiological information. The expected result is Negative.  Fact Sheet for Patients:  BloggerCourse.com  Fact Sheet for Healthcare Providers:  SeriousBroker.it  This test is no t yet approved or cleared by the [place]  FDA and  has been authorized for detection and/or diagnosis of SARS-CoV-2 by FDA  under an Emergency Use Authorization (EUA). This EUA will remain  in effect (meaning this test can be used) for the duration of the COVID-19 declaration under Section 564(b)(1) of the Act, 21 U.S.C.section 360bbb-3(b)(1), unless the authorization is terminated  or revoked sooner.       Influenza A by PCR NEGATIVE NEGATIVE Final   Influenza B by PCR NEGATIVE NEGATIVE Final    Comment: (NOTE) The Xpert Xpress SARS-CoV-2/FLU/RSV plus assay is intended as an aid in the diagnosis of influenza from Nasopharyngeal swab specimens and should not be used as a sole basis for treatment. Nasal washings and aspirates are unacceptable for Xpert Xpress SARS-CoV-2/FLU/RSV testing.  Fact Sheet for Patients: BloggerCourse.com  Fact Sheet for Healthcare Providers: SeriousBroker.it  This test is not yet approved or cleared by the [place]  FDA and has been authorized for detection and/or diagnosis of SARS-CoV-2 by FDA under an Emergency Use Authorization (EUA). This EUA will remain in effect (meaning this test can be used) for the duration of the COVID-19 declaration under Section 564(b)(1) of the Act, 21 U.S.C. section 360bbb-3(b)(1), unless the authorization is terminated or revoked.     Resp Syncytial Virus by PCR NEGATIVE NEGATIVE Final    Comment: (NOTE) Fact Sheet for Patients: BloggerCourse.com  Fact Sheet for Healthcare Providers: SeriousBroker.it  This test is not yet approved or cleared by the [place]  FDA and has been authorized for detection and/or diagnosis of SARS-CoV-2 by FDA under an Emergency Use Authorization (EUA). This EUA will remain in effect (meaning this test can be used) for the duration of the COVID-19 declaration under Section 564(b)(1) of the Act, 21 U.S.C. section 360bbb-3(b)(1), unless the authorization is terminated or revoked.  Performed at [facility], [address] [place], [place] [address]      Total time spend on discharging this patient, including the last patient exam, discussing the hospital stay, instructions for ongoing care as it relates to all pertinent caregivers, as well as preparing the medical discharge records, prescriptions, and/or referrals as applicable, is 35 minutes.    Garrison Kanner, MD  [facility] 05/13/2023, 2:02 PM

## 2023-05-14 ENCOUNTER — Encounter: Payer: Self-pay | Admitting: Emergency Medicine

## 2023-05-14 ENCOUNTER — Inpatient Hospital Stay
Admission: EM | Admit: 2023-05-14 | Discharge: 2023-05-16 | DRG: 202 | Disposition: A | Discharge status: Home / Self Care | Payer: Self-pay | Attending: Internal Medicine | Admitting: Internal Medicine

## 2023-05-14 ENCOUNTER — Emergency Department: Payer: Self-pay

## 2023-05-14 ENCOUNTER — Other Ambulatory Visit: Payer: Self-pay

## 2023-05-14 DIAGNOSIS — Z79899 Other long term (current) drug therapy: Secondary | ICD-10-CM

## 2023-05-14 DIAGNOSIS — E662 Morbid (severe) obesity with alveolar hypoventilation: Secondary | ICD-10-CM | POA: Diagnosis present

## 2023-05-14 DIAGNOSIS — E873 Alkalosis: Secondary | ICD-10-CM | POA: Diagnosis present

## 2023-05-14 DIAGNOSIS — M1990 Unspecified osteoarthritis, unspecified site: Secondary | ICD-10-CM | POA: Diagnosis present

## 2023-05-14 DIAGNOSIS — J441 Chronic obstructive pulmonary disease with (acute) exacerbation: Secondary | ICD-10-CM | POA: Diagnosis present

## 2023-05-14 DIAGNOSIS — T380X5A Adverse effect of glucocorticoids and synthetic analogues, initial encounter: Secondary | ICD-10-CM | POA: Diagnosis not present

## 2023-05-14 DIAGNOSIS — E66813 Obesity, class 3: Secondary | ICD-10-CM | POA: Diagnosis present

## 2023-05-14 DIAGNOSIS — F17200 Nicotine dependence, unspecified, uncomplicated: Secondary | ICD-10-CM | POA: Diagnosis present

## 2023-05-14 DIAGNOSIS — J45901 Unspecified asthma with (acute) exacerbation: Secondary | ICD-10-CM | POA: Diagnosis present

## 2023-05-14 DIAGNOSIS — F17210 Nicotine dependence, cigarettes, uncomplicated: Secondary | ICD-10-CM | POA: Diagnosis present

## 2023-05-14 DIAGNOSIS — Z1152 Encounter for screening for COVID-19: Secondary | ICD-10-CM

## 2023-05-14 DIAGNOSIS — Z28310 Unvaccinated for covid-19: Secondary | ICD-10-CM

## 2023-05-14 DIAGNOSIS — Z6841 Body Mass Index (BMI) 40.0 and over, adult: Secondary | ICD-10-CM

## 2023-05-14 DIAGNOSIS — J4551 Severe persistent asthma with (acute) exacerbation: Principal | ICD-10-CM | POA: Diagnosis present

## 2023-05-14 DIAGNOSIS — D72829 Elevated white blood cell count, unspecified: Secondary | ICD-10-CM | POA: Diagnosis not present

## 2023-05-14 DIAGNOSIS — R0602 Shortness of breath: Secondary | ICD-10-CM | POA: Diagnosis not present

## 2023-05-14 DIAGNOSIS — G4733 Obstructive sleep apnea (adult) (pediatric): Secondary | ICD-10-CM | POA: Diagnosis present

## 2023-05-14 DIAGNOSIS — J4541 Moderate persistent asthma with (acute) exacerbation: Secondary | ICD-10-CM | POA: Diagnosis not present

## 2023-05-14 DIAGNOSIS — Z8249 Family history of ischemic heart disease and other diseases of the circulatory system: Secondary | ICD-10-CM

## 2023-05-14 DIAGNOSIS — J9601 Acute respiratory failure with hypoxia: Secondary | ICD-10-CM | POA: Diagnosis present

## 2023-05-14 DIAGNOSIS — I10 Essential (primary) hypertension: Secondary | ICD-10-CM | POA: Diagnosis present

## 2023-05-14 LAB — BASIC METABOLIC PANEL WITH GFR
Anion gap: 9 (ref 5–15)
BUN: 19 mg/dL (ref 6–20)
CO2: 24 mmol/L (ref 22–32)
Calcium: 8.9 mg/dL (ref 8.9–10.3)
Chloride: 103 mmol/L (ref 98–111)
Creatinine, Ser: 1.12 mg/dL (ref 0.61–1.24)
GFR, Estimated: >60 mL/min (ref >60)
Glucose, Bld: 90 mg/dL (ref 70–99)
Potassium: 3.7 mmol/L (ref 3.5–5.1)
Sodium: 136 mmol/L (ref 135–145)

## 2023-05-14 LAB — CBC WITH DIFFERENTIAL/PLATELET
Abs Immature Granulocytes: 0.03 10*3/uL (ref 0.00–0.07)
Basophils Absolute: 0.1 10*3/uL (ref 0.0–0.1)
Basophils Relative: 1 %
Eosinophils Absolute: 0.1 10*3/uL (ref 0.0–0.5)
Eosinophils Relative: 1 %
HCT: 39.7 % (ref 39.0–52.0)
Hemoglobin: 12.8 g/dL — ABNORMAL LOW (ref 13.0–17.0)
Immature Granulocytes: 0 %
Lymphocytes Relative: 31 %
Lymphs Abs: 2.9 10*3/uL (ref 0.7–4.0)
MCH: 27.7 pg (ref 26.0–34.0)
MCHC: 32.2 g/dL (ref 30.0–36.0)
MCV: 85.9 fL (ref 80.0–100.0)
Monocytes Absolute: 0.6 10*3/uL (ref 0.1–1.0)
Monocytes Relative: 6 %
Neutro Abs: 6 10*3/uL (ref 1.7–7.7)
Neutrophils Relative %: 61 %
Platelets: 383 10*3/uL (ref 150–400)
RBC: 4.62 MIL/uL (ref 4.22–5.81)
RDW: 15 % (ref 11.5–15.5)
WBC: 9.6 10*3/uL (ref 4.0–10.5)
nRBC: 0 % (ref 0.0–0.2)

## 2023-05-14 LAB — BLOOD GAS, VENOUS
Acid-Base Excess: 2.5 mmol/L — ABNORMAL HIGH (ref 0.0–2.0)
Bicarbonate: 26.3 mmol/L (ref 20.0–28.0)
O2 Saturation: 98.9 %
Patient temperature: 37
pCO2, Ven: 37 mmHg — ABNORMAL LOW (ref 44–60)
pH, Ven: 7.46 — ABNORMAL HIGH (ref 7.25–7.43)
pO2, Ven: 95 mmHg — ABNORMAL HIGH (ref 32–45)

## 2023-05-14 LAB — TROPONIN I (HIGH SENSITIVITY): Troponin I (High Sensitivity): 7 ng/L (ref <18)

## 2023-05-14 MED ORDER — METHYLPREDNISOLONE SODIUM SUCC 125 MG IJ SOLR
125.0000 mg | Freq: Once | INTRAMUSCULAR | Status: AC
  Administered 2023-05-14: 125 mg via INTRAVENOUS
  Filled 2023-05-14: qty 2

## 2023-05-14 MED ORDER — ALBUTEROL SULFATE (2.5 MG/3ML) 0.083% IN NEBU
5.0000 mg | INHALATION_SOLUTION | Freq: Once | RESPIRATORY_TRACT | Status: AC
  Administered 2023-05-14: 5 mg via RESPIRATORY_TRACT
  Filled 2023-05-14: qty 6

## 2023-05-14 MED ORDER — MAGNESIUM SULFATE 2 GM/50ML IV SOLN
2.0000 g | Freq: Once | INTRAVENOUS | Status: AC
  Administered 2023-05-14: 2 g via INTRAVENOUS
  Filled 2023-05-14: qty 50

## 2023-05-14 MED ORDER — IPRATROPIUM BROMIDE 0.02 % IN SOLN
1.0000 mg | Freq: Once | RESPIRATORY_TRACT | Status: AC
  Administered 2023-05-14: 1 mg via RESPIRATORY_TRACT
  Filled 2023-05-14: qty 5

## 2023-05-14 NOTE — ED Notes (Signed)
 Pt presents with labored breathing and audible wheezing at this time

## 2023-05-14 NOTE — ED Provider Notes (Signed)
 [facility] Provider Note    Event Date/Time   First MD Initiated Contact with Patient 05/14/23 2310     (approximate)   History   Shortness of Breath   HPI  Justin Reid is a 43 y.o. male with history of hypertension, obesity, asthma who presents to the emergency department shortness of breath and wheezing.  Patient admitted regularly for asthma exacerbation and has required BiPAP and intubation previously.  He reports feeling tightness in his chest but no fevers or productive cough.  No lower extremity swelling or pain.  Was just admitted to the hospital but had to leave yesterday AMA due to a family emergency.  Does not wear oxygen  at home.   History provided by patient, significant other.    Past Medical History:  Diagnosis Date   2019-nCoV vaccination declined 11/10/2020   Arthritis    Asthma    Hypertension    Morbid obesity (HCC)    Shingles    Tobacco use     History reviewed. No pertinent surgical history.  MEDICATIONS:  Prior to Admission medications   Medication Sig Start Date End Date Taking? Authorizing Provider  acetaminophen  (TYLENOL ) 325 MG tablet Take 2 tablets (650 mg total) by mouth every 6 (six) hours as needed for mild pain (pain score 1-3) or fever. 04/06/23   Alexander, Natalie, DO  albuterol  (VENTOLIN  HFA) 108 (90 Base) MCG/ACT inhaler Inhale 2 puffs into the lungs every 4 (four) hours as needed for wheezing or shortness of breath. 04/22/23   Tiajuana Fluke, MD  amLODipine  (NORVASC ) 5 MG tablet Take 1 tablet (5 mg total) by mouth daily. 04/22/23 06/21/23  Tiajuana Fluke, MD  Dextromethorphan -guaiFENesin  20-200 MG/20ML LIQD Take 10 mLs by mouth every 6 (six) hours as needed. 04/06/23   Alexander, Natalie, DO  fluticasone -salmeterol (WIXELA INHUB ) 250-50 MCG/ACT AEPB Inhale 1 puff into the lungs in the morning and at bedtime. 04/22/23 06/21/23  Tiajuana Fluke, MD  furosemide  (LASIX ) 40 MG tablet Take 1 tablet (40 mg  total) by mouth daily. 04/30/23   Lorita Rosa, MD  ipratropium-albuterol  (DUONEB) 0.5-2.5 (3) MG/3ML SOLN Take 3 mLs by nebulization every 6 (six) hours as needed. 05/13/23   Garrison Kanner, MD  loratadine  (CLARITIN ) 10 MG tablet Take 1 tablet (10 mg total) by mouth daily. 04/06/23   Alexander, Natalie, DO  metoprolol  succinate (TOPROL -XL) 50 MG 24 hr tablet Take 1 tablet (50 mg total) by mouth daily. Take with or immediately following a meal. 04/22/23 06/21/23  Margery Sheets B, MD  montelukast  (SINGULAIR ) 10 MG tablet Take 1 tablet (10 mg total) by mouth daily. 04/06/23   Alexander, Natalie, DO  nicotine  (NICODERM CQ  - DOSED IN MG/24 HOURS) 14 mg/24hr patch Place 1 patch (14 mg total) onto the skin daily as needed (nicotine  craving). 04/22/23   Tiajuana Fluke, MD  pantoprazole  (PROTONIX ) 40 MG tablet Take 1 tablet (40 mg total) by mouth daily. 04/22/23 06/21/23  Tiajuana Fluke, MD    Physical Exam   Triage Vital Signs: ED Triage Vitals [05/14/23 2303]  Encounter Vitals Group     BP (!) 166/107     Systolic BP Percentile      Diastolic BP Percentile      Pulse Rate 90     Resp (!) 22     Temp 98.2 F (36.8 C)     Temp Source Oral     SpO2 95 %     Weight (!) 310  lb (140.6 kg)     Height 5\' 10"  (1.778 m)     Head Circumference      Peak Flow      Pain Score      Pain Loc      Pain Education      Exclude from Growth Chart     Most recent vital signs: Vitals:   05/15/23 0610 05/15/23 0700  BP:  131/83  Pulse:  92  Resp:  20  Temp: 97.7 F (36.5 C)   SpO2:  95%    CONSTITUTIONAL: Alert, responds appropriately to questions.  In respiratory distress HEAD: Normocephalic, atraumatic EYES: Conjunctivae clear, pupils appear equal, sclera nonicteric ENT: normal nose; moist mucous membranes NECK: Supple, normal ROM CARD: Regular and tachycardic; S1 and S2 appreciated RESP: Patient has increased work of breathing, only able to speak 1-2 words at a time.  Patient in moderate  respiratory distress.  Diminished aeration diffusely with scattered expiratory wheezes.  No rhonchi or rales. ABD/GI: Non-distended; soft, non-tender, no rebound, no guarding, no peritoneal signs BACK: The back appears normal EXT: Normal ROM in all joints; no deformity noted, no edema, no calf tenderness or calf swelling SKIN: Normal color for age and race; warm; no rash on exposed skin NEURO: Moves all extremities equally, normal speech PSYCH: The patient's mood and manner are appropriate.   ED Results / Procedures / Treatments   LABS: (all labs ordered are listed, but only abnormal results are displayed) Labs Reviewed  CBC WITH DIFFERENTIAL/PLATELET - Abnormal; Notable for the following components:      Result Value   Hemoglobin 12.8 (*)    All other components within normal limits  BLOOD GAS, VENOUS - Abnormal; Notable for the following components:   pH, Ven 7.46 (*)    pCO2, Ven 37 (*)    pO2, Ven 95 (*)    Acid-Base Excess 2.5 (*)    All other components within normal limits  RESP PANEL BY RT-PCR (RSV, FLU A&B, COVID)  [id number]  BASIC METABOLIC PANEL WITH GFR  TROPONIN I (HIGH SENSITIVITY)  TROPONIN I (HIGH SENSITIVITY)     EKG:  EKG Interpretation Date/Time:  Saturday May 14 2023 23:33:21 EDT Ventricular Rate:  83 PR Interval:  183 QRS Duration:  103 QT Interval:  384 QTC Calculation: 452 R Axis:   -2  Text Interpretation: Sinus rhythm Abnormal R-wave progression, early transition Confirmed by Verneda Golder [phone]) on 05/15/2023 7:48:20 AM         RADIOLOGY: My personal review and interpretation of imaging: Chest x-ray clear.  I have personally reviewed all radiology reports.   DG Chest Portable 1 View Result Date: 05/15/2023 CLINICAL DATA:  SOB EXAM: PORTABLE CHEST 1 VIEW COMPARISON:  Chest x-ray 05/12/2023, CT abdomen pelvis 04/26/2023 FINDINGS: The heart and mediastinal contours are unchanged. Left base atelectasis. No focal consolidation. No pulmonary  edema. No pleural effusion. No pneumothorax. No acute osseous abnormality. IMPRESSION: No active disease. Electronically Signed   By: Morgane  Naveau M.D.   On: 05/15/2023 00:39     PROCEDURES:  Critical Care performed: Yes, see critical care procedure note(s)   CRITICAL CARE Performed by: Starling Eck Chancellor Vanderloop   Total critical care time: 40 minutes  Critical care time was exclusive of separately billable procedures and treating other patients.  Critical care was necessary to treat or prevent imminent or life-threatening deterioration.  Critical care was time spent personally by me on the following activities: development of treatment plan with patient and/or surrogate  as well as nursing, discussions with consultants, evaluation of patient's response to treatment, examination of patient, obtaining history from patient or surrogate, ordering and performing treatments and interventions, ordering and review of laboratory studies, ordering and review of radiographic studies, pulse oximetry and re-evaluation of patient's condition.   Aaron Aas1-3 Lead EKG Interpretation  Performed by: Joaquim Tolen, Clover Dao, DO Authorized by: Sheridyn Canino, Clover Dao, DO     Interpretation: normal     ECG rate:  92   ECG rate assessment: normal     Rhythm: sinus rhythm     Ectopy: none     Conduction: normal       IMPRESSION / MDM / ASSESSMENT AND PLAN / ED COURSE  I reviewed the triage vital signs and the nursing notes.    Patient here with shortness of breath, wheezing, respiratory distress.  History of asthma.  The patient is on the cardiac monitor to evaluate for evidence of arrhythmia and/or significant heart rate changes.   DIFFERENTIAL DIAGNOSIS (includes but not limited to):   Asthma exacerbation, pneumonia, viral URI, pneumothorax, PE, ACS   Patient's presentation is most consistent with acute presentation with potential threat to life or bodily function.   PLAN: Will obtain labs, COVID and flu swab, chest  x-ray.  Will give breathing treatments, IV Solu-Medrol .  Will start him on BiPAP for work of breathing.  He will need admission.   MEDICATIONS GIVEN IN ED: Medications  enoxaparin  (LOVENOX ) injection 70 mg (has no administration in time range)  acetaminophen  (TYLENOL ) tablet 650 mg (has no administration in time range)    Or  acetaminophen  (TYLENOL ) suppository 650 mg (has no administration in time range)  ondansetron  (ZOFRAN ) tablet 4 mg (has no administration in time range)    Or  ondansetron  (ZOFRAN ) injection 4 mg (has no administration in time range)  methylPREDNISolone  sodium succinate (SOLU-MEDROL ) 40 mg/mL injection 40 mg (has no administration in time range)    Followed by  predniSONE  (DELTASONE ) tablet 40 mg (has no administration in time range)  ipratropium-albuterol  (DUONEB) 0.5-2.5 (3) MG/3ML nebulizer solution 3 mL (3 mLs Nebulization Given 05/15/23 0611)  albuterol  (PROVENTIL ) (2.5 MG/3ML) 0.083% nebulizer solution 2.5 mg (has no administration in time range)  HYDROcodone -acetaminophen  (NORCO/VICODIN) 5-325 MG per tablet 1-2 tablet (has no administration in time range)  guaiFENesin  (MUCINEX ) 12 hr tablet 600 mg (600 mg Oral Not Given 05/15/23 0301)  amLODipine  (NORVASC ) tablet 5 mg (has no administration in time range)  metoprolol  succinate (TOPROL -XL) 24 hr tablet 50 mg (has no administration in time range)  nicotine  (NICODERM CQ  - dosed in mg/24 hours) patch 14 mg (has no administration in time range)  pantoprazole  (PROTONIX ) EC tablet 40 mg (has no administration in time range)  loratadine  (CLARITIN ) tablet 10 mg (has no administration in time range)  montelukast  (SINGULAIR ) tablet 10 mg (has no administration in time range)  magnesium  sulfate IVPB 2 g 50 mL (0 g Intravenous Stopped 05/15/23 0048)  methylPREDNISolone  sodium succinate (SOLU-MEDROL ) 125 mg/2 mL injection 125 mg (125 mg Intravenous Given 05/14/23 2327)  albuterol  (PROVENTIL ) (2.5 MG/3ML) 0.083% nebulizer  solution 5 mg (5 mg Nebulization Given 05/14/23 2328)  ipratropium (ATROVENT ) nebulizer solution 1 mg (1 mg Nebulization Given 05/14/23 2328)  albuterol  (PROVENTIL ) (2.5 MG/3ML) 0.083% nebulizer solution 5 mg (5 mg Nebulization Given 05/15/23 0051)  ipratropium (ATROVENT ) nebulizer solution 0.5 mg (0.5 mg Nebulization Given 05/15/23 0051)     ED COURSE: Patient's labs show no leukocytosis.  Normal electrolytes.  Troponin negative.  COVID,  flu and RSV negative.  Blood gas shows respiratory alkalosis.  Patient clinically improving on BiPAP.  Chest x-ray reviewed and interpreted by myself and the radiologist and is clear.  Will discuss with hospitalist for admission for asthma exacerbation.  Patient states he will be able to stay at this time.   CONSULTS:  Consulted and discussed patient's case with hospitalist, Dr. Vallarie Gauze.  I have recommended admission and consulting physician agrees and will place admission orders.  Patient (and family if present) agree with this plan.   I reviewed all nursing notes, vitals, pertinent previous records.  All labs, EKGs, imaging ordered have been independently reviewed and interpreted by myself.    OUTSIDE RECORDS REVIEWED: Reviewed recent admission notes.       FINAL CLINICAL IMPRESSION(S) / ED DIAGNOSES   Final diagnoses:  Severe persistent asthma with acute exacerbation     Rx / DC Orders   ED Discharge Orders     None        Note:  This document was prepared using Dragon voice recognition software and may include unintentional dictation errors.   Heriberto Stmartin, Clover Dao, DO 05/15/23 [phone]

## 2023-05-14 NOTE — ED Notes (Signed)
 CCMD notified of pt placed on cardiac monitor

## 2023-05-14 NOTE — ED Notes (Signed)
 EDP and RT in room with Bipap at this time.

## 2023-05-14 NOTE — ED Notes (Signed)
RT called for assessment.

## 2023-05-14 NOTE — ED Triage Notes (Signed)
 Pt in POV with sob, labored breathing worsened throughout the day. Pt was admitted days ago for asthma exac and had to leave AMA due to family reasons. Arrives with expiratory wheezes and labored breathing - took home neb trx PTA no relief

## 2023-05-15 DIAGNOSIS — J45901 Unspecified asthma with (acute) exacerbation: Secondary | ICD-10-CM | POA: Diagnosis present

## 2023-05-15 DIAGNOSIS — G4733 Obstructive sleep apnea (adult) (pediatric): Secondary | ICD-10-CM

## 2023-05-15 DIAGNOSIS — I10 Essential (primary) hypertension: Secondary | ICD-10-CM

## 2023-05-15 DIAGNOSIS — J4551 Severe persistent asthma with (acute) exacerbation: Principal | ICD-10-CM

## 2023-05-15 DIAGNOSIS — F17200 Nicotine dependence, unspecified, uncomplicated: Secondary | ICD-10-CM

## 2023-05-15 LAB — RESP PANEL BY RT-PCR (RSV, FLU A&B, COVID)  RVPGX2
Influenza A by PCR: NEGATIVE
Influenza B by PCR: NEGATIVE
Resp Syncytial Virus by PCR: NEGATIVE
SARS Coronavirus 2 by RT PCR: NEGATIVE

## 2023-05-15 LAB — TROPONIN I (HIGH SENSITIVITY): Troponin I (High Sensitivity): 6 ng/L (ref <18)

## 2023-05-15 MED ORDER — METOPROLOL SUCCINATE ER 50 MG PO TB24
50.0000 mg | ORAL_TABLET | Freq: Every day | ORAL | Status: DC
  Administered 2023-05-15 – 2023-05-16 (×2): 50 mg via ORAL
  Filled 2023-05-15 (×2): qty 1

## 2023-05-15 MED ORDER — NICOTINE 14 MG/24HR TD PT24: 14.0000 mg | MEDICATED_PATCH | Freq: Every day | TRANSDERMAL | Status: DC | PRN

## 2023-05-15 MED ORDER — LORATADINE 10 MG PO TABS
10.0000 mg | ORAL_TABLET | Freq: Every day | ORAL | Status: DC
  Administered 2023-05-15 – 2023-05-16 (×2): 10 mg via ORAL
  Filled 2023-05-15 (×2): qty 1

## 2023-05-15 MED ORDER — ALBUTEROL SULFATE (2.5 MG/3ML) 0.083% IN NEBU
2.5000 mg | INHALATION_SOLUTION | RESPIRATORY_TRACT | Status: DC | PRN
  Administered 2023-05-15: 2.5 mg via RESPIRATORY_TRACT
  Filled 2023-05-15: qty 3

## 2023-05-15 MED ORDER — HYDROCODONE-ACETAMINOPHEN 5-325 MG PO TABS
1.0000 | ORAL_TABLET | ORAL | Status: DC | PRN
  Administered 2023-05-15 – 2023-05-16 (×5): 1 via ORAL
  Filled 2023-05-15 (×5): qty 1

## 2023-05-15 MED ORDER — GUAIFENESIN ER 600 MG PO TB12
600.0000 mg | ORAL_TABLET | Freq: Two times a day (BID) | ORAL | Status: DC
  Administered 2023-05-15 – 2023-05-16 (×3): 600 mg via ORAL
  Filled 2023-05-15 (×3): qty 1

## 2023-05-15 MED ORDER — IPRATROPIUM BROMIDE 0.02 % IN SOLN
0.5000 mg | Freq: Once | RESPIRATORY_TRACT | Status: AC
  Administered 2023-05-15: 0.5 mg via RESPIRATORY_TRACT
  Filled 2023-05-15: qty 2.5

## 2023-05-15 MED ORDER — AMLODIPINE BESYLATE 5 MG PO TABS
5.0000 mg | ORAL_TABLET | Freq: Every day | ORAL | Status: DC
Start: 2023-05-15 — End: 2023-05-16
  Administered 2023-05-15 – 2023-05-16 (×2): 5 mg via ORAL
  Filled 2023-05-15 (×2): qty 1

## 2023-05-15 MED ORDER — ACETAMINOPHEN 325 MG PO TABS: 650.0000 mg | ORAL_TABLET | Freq: Four times a day (QID) | ORAL | Status: DC | PRN

## 2023-05-15 MED ORDER — ALBUTEROL SULFATE (2.5 MG/3ML) 0.083% IN NEBU
5.0000 mg | INHALATION_SOLUTION | Freq: Once | RESPIRATORY_TRACT | Status: AC
  Administered 2023-05-15: 5 mg via RESPIRATORY_TRACT
  Filled 2023-05-15: qty 6

## 2023-05-15 MED ORDER — PANTOPRAZOLE SODIUM 40 MG PO TBEC
40.0000 mg | DELAYED_RELEASE_TABLET | Freq: Every day | ORAL | Status: DC
  Administered 2023-05-15 – 2023-05-16 (×2): 40 mg via ORAL
  Filled 2023-05-15 (×2): qty 1

## 2023-05-15 MED ORDER — ONDANSETRON HCL 4 MG PO TABS: 4.0000 mg | ORAL_TABLET | Freq: Four times a day (QID) | ORAL | Status: DC | PRN

## 2023-05-15 MED ORDER — IPRATROPIUM-ALBUTEROL 0.5-2.5 (3) MG/3ML IN SOLN
3.0000 mL | Freq: Four times a day (QID) | RESPIRATORY_TRACT | Status: DC
  Administered 2023-05-15 – 2023-05-16 (×6): 3 mL via RESPIRATORY_TRACT
  Filled 2023-05-15 (×6): qty 3

## 2023-05-15 MED ORDER — PREDNISONE 20 MG PO TABS
40.0000 mg | ORAL_TABLET | Freq: Every day | ORAL | Status: DC
  Administered 2023-05-16: 40 mg via ORAL
  Filled 2023-05-15: qty 2

## 2023-05-15 MED ORDER — METHYLPREDNISOLONE SODIUM SUCC 40 MG IJ SOLR
40.0000 mg | Freq: Two times a day (BID) | INTRAMUSCULAR | Status: AC
  Administered 2023-05-15 (×2): 40 mg via INTRAVENOUS
  Filled 2023-05-15 (×2): qty 1

## 2023-05-15 MED ORDER — ENOXAPARIN SODIUM 80 MG/0.8ML IJ SOSY
70.0000 mg | PREFILLED_SYRINGE | INTRAMUSCULAR | Status: DC
  Administered 2023-05-15 – 2023-05-16 (×2): 70 mg via SUBCUTANEOUS
  Filled 2023-05-15 (×2): qty 0.7

## 2023-05-15 MED ORDER — ACETAMINOPHEN 650 MG RE SUPP: 650.0000 mg | Freq: Four times a day (QID) | RECTAL | Status: DC | PRN

## 2023-05-15 MED ORDER — MONTELUKAST SODIUM 10 MG PO TABS
10.0000 mg | ORAL_TABLET | Freq: Every day | ORAL | Status: DC
  Administered 2023-05-15 – 2023-05-16 (×2): 10 mg via ORAL
  Filled 2023-05-15 (×2): qty 1

## 2023-05-15 MED ORDER — ONDANSETRON HCL 4 MG/2ML IJ SOLN: 4.0000 mg | Freq: Four times a day (QID) | INTRAMUSCULAR | Status: DC | PRN

## 2023-05-15 NOTE — ED Notes (Signed)
 This RN gave report to Roena Clark and performed care handoff. Call light in reach, bed wheels locked, side rails raised, pt updated on plan of care. Rounding completed.

## 2023-05-15 NOTE — ED Notes (Signed)
 Pt requested to be on a regular diet instead of a heart healthy diet, provider notified.

## 2023-05-15 NOTE — ED Notes (Signed)
 This RN assumed care of pt, pt family remains unsatisfied with care despite this RN attempts to satisfy pt and family needs. ED Charge RN aware of situation.

## 2023-05-15 NOTE — ED Notes (Signed)
 Pt ambulated to bathroom at this time with no assistance. Pt ambulated well with no complaints.

## 2023-05-15 NOTE — ED Notes (Signed)
 This RN received report from Michaeleen Adler RN and performed care handoff. This RN introduced self to pt and pt's spouse. Call light in reach, bed wheels locked, side rails raised, pt updated on plan of care. Rounding completed.

## 2023-05-15 NOTE — Assessment & Plan Note (Signed)
 Nicotine  patch

## 2023-05-15 NOTE — Final Progress Note (Signed)
 Same-day rounding progress note  Patient seen and examined in the ED.  Please see Dr. Harrel Lim dictated history and physical for further details.  I agree with her assessment and plan.  Acute hypoxic respiratory failure due to severe asthma exacerbation History of prior intubation and frequent hospitalization-9 admissions in last 6 months and 3 ED visits in the last 6 months per [facility] review Unable to obtain CPAP at home.  Just had a sleep study about a week ago per his girlfriend at bedside Follows with Dr. Aleskerov at [place] [facility] pulmonary Wean off BiPAP to nasal cannula oxygen   Discussed with his girlfriend at bedside Time spent 35 minutes

## 2023-05-15 NOTE — ED Notes (Signed)
 Pt removed from Bipap per doctor for trial and placed on 3L [place].

## 2023-05-15 NOTE — ED Notes (Signed)
 RT contacted about breathing treatment and pt requesting humidified oxygen  for his [place] at 3L.

## 2023-05-15 NOTE — Assessment & Plan Note (Addendum)
 Continue home amlodipine and metoprolol

## 2023-05-15 NOTE — Progress Notes (Signed)
 PHARMACIST - PHYSICIAN COMMUNICATION  CONCERNING:  Enoxaparin  (Lovenox ) for DVT Prophylaxis    RECOMMENDATION: Patient was prescribed enoxaprin 40mg  q24 hours for VTE prophylaxis.   Filed Weights   05/14/23 2303  Weight: (!) 140.6 kg (310 lb)    Body mass index is 44.48 kg/m.  Estimated Creatinine Clearance: 122.8 mL/min (by C-G formula based on SCr of 1.12 mg/dL).   Based on [facility] policy patient is candidate for enoxaparin  0.5mg /kg TBW SQ every 24 hours based on BMI being >30.  DESCRIPTION: Pharmacy has adjusted enoxaparin  dose per [facility] policy.  Patient is now receiving enoxaparin  0.5 mg/kg every 24 hours   Coretta Dexter, PharmD, [facility] 05/15/2023 2:01 AM

## 2023-05-15 NOTE — H&P (Signed)
 History and Physical    Patient: Justin Reid YNW:[id number] DOB: 1980/12/16 DOA: 05/14/2023 DOS: the patient was seen and examined on 05/15/2023 PCP: Pcp, No  Patient coming from: Home  Chief Complaint:  Chief Complaint  Patient presents with   Shortness of Breath    HPI: Justin Reid is a 43 y.o. male with medical history significant for Severe asthma with prior intubation, HTN, morbid obesity, OSA (not on CPAP at home), frequent hospitalizations for asthma exacerbation, most recently 05/12/2023, signing out AMA the following day due to a family emergency who returns to the ED with worsening symptoms and increased work or breathing requiring BiPAP.  He denies chest pain, fever or chills, lower extremity pain or swelling. ED course and data review: BP 166/107 with pulse 90, respiratory rate 22 and O2 sats 95% on room air VBG with pH 7.46 and PCO237 CBC and BMP unremarkable Troponin 6 Respiratory viral panel negative for COVID flu and RSV  EKG, personally viewed and interpreted showing sinus at 83 with no acute findings  Chest x-ray with no active disease  Patient treated with albuterol , steroids, IV magnesium  but continued to have increased work of breathing at which point he was placed on BiPAP and admission requested     Review of Systems: As mentioned in the history of present illness. All other systems reviewed and are negative.  Past Medical History:  Diagnosis Date   2019-nCoV vaccination declined 11/10/2020   Arthritis    Asthma    Hypertension    Morbid obesity (HCC)    Shingles    Tobacco use    History reviewed. No pertinent surgical history. Social History:  reports that he has been smoking cigarettes. He has a 2.5 pack-year smoking history. He has never used smokeless tobacco. He reports current alcohol use. He reports that he does not use drugs.  No Known Allergies  Family History  Problem Relation Age of Onset   Hypertension Father     Prior to  Admission medications   Medication Sig Start Date End Date Taking? Authorizing Provider  acetaminophen  (TYLENOL ) 325 MG tablet Take 2 tablets (650 mg total) by mouth every 6 (six) hours as needed for mild pain (pain score 1-3) or fever. 04/06/23   Alexander, Natalie, DO  albuterol  (VENTOLIN  HFA) 108 [phone] Base) MCG/ACT inhaler Inhale 2 puffs into the lungs every 4 (four) hours as needed for wheezing or shortness of breath. 04/22/23   Tiajuana Fluke, MD  amLODipine  (NORVASC ) 5 MG tablet Take 1 tablet (5 mg total) by mouth daily. 04/22/23 06/21/23  Tiajuana Fluke, MD  Dextromethorphan -guaiFENesin  20-200 MG/20ML LIQD Take 10 mLs by mouth every 6 (six) hours as needed. 04/06/23   Alexander, Natalie, DO  fluticasone -salmeterol (WIXELA INHUB ) 250-50 MCG/ACT AEPB Inhale 1 puff into the lungs in the morning and at bedtime. 04/22/23 06/21/23  Tiajuana Fluke, MD  furosemide  (LASIX ) 40 MG tablet Take 1 tablet (40 mg total) by mouth daily. 04/30/23   Lorita Rosa, MD  ipratropium-albuterol  (DUONEB) 0.5-2.5 (3) MG/3ML SOLN Take 3 mLs by nebulization every 6 (six) hours as needed. 05/13/23   Garrison Kanner, MD  loratadine  (CLARITIN ) 10 MG tablet Take 1 tablet (10 mg total) by mouth daily. 04/06/23   Alexander, Natalie, DO  metoprolol  succinate (TOPROL -XL) 50 MG 24 hr tablet Take 1 tablet (50 mg total) by mouth daily. Take with or immediately following a meal. 04/22/23 06/21/23  Margery Sheets B, MD  montelukast  (SINGULAIR ) 10 MG tablet Take  1 tablet (10 mg total) by mouth daily. 04/06/23   Alexander, Natalie, DO  nicotine  (NICODERM CQ  - DOSED IN MG/24 HOURS) 14 mg/24hr patch Place 1 patch (14 mg total) onto the skin daily as needed (nicotine  craving). 04/22/23   Tiajuana Fluke, MD  pantoprazole  (PROTONIX ) 40 MG tablet Take 1 tablet (40 mg total) by mouth daily. 04/22/23 06/21/23  Tiajuana Fluke, MD    Physical Exam: Vitals:   05/14/23 2303 05/15/23 0000 05/15/23 0030  BP: (!) 166/107 (!) 133/91 (!) 134/95   Pulse: 90 85 74  Resp: (!) 22 13 17   Temp: 98.2 F (36.8 C)    TempSrc: Oral    SpO2: 95% 96% 95%  Weight: (!) 140.6 kg    Height: 5\' 10"  (1.778 m)     Physical Exam Vitals and nursing note reviewed.  Constitutional:      Appearance: He is morbidly obese.  HENT:     Head: Normocephalic and atraumatic.  Cardiovascular:     Rate and Rhythm: Normal rate and regular rhythm.     Heart sounds: Normal heart sounds.  Pulmonary:     Effort: Tachypnea present.     Breath sounds: Wheezing and rhonchi present.     Comments: On BiPAP Abdominal:     Palpations: Abdomen is soft.     Tenderness: There is no abdominal tenderness.  Neurological:     Mental Status: Mental status is at baseline.     Labs on Admission: I have personally reviewed following labs and imaging studies  CBC: Recent Labs  Lab 05/12/23 1449 05/14/23 2315  WBC 5.8 9.6  NEUTROABS 3.3 6.0  HGB 12.6* 12.8*  HCT 39.0 39.7  MCV 85.9 85.9  PLT 338 383   Basic Metabolic Panel: Recent Labs  Lab 05/12/23 1449 05/12/23 1538 05/14/23 2315  NA 138  --  136  K 3.5  --  3.7  CL 103  --  103  CO2 27  --  24  GLUCOSE 100*  --  90  BUN 13  --  19  CREATININE 0.88  --  1.12  CALCIUM 8.9  --  8.9  MG  --  2.1  --    GFR: Estimated Creatinine Clearance: 122.8 mL/min (by C-G formula based on SCr of 1.12 mg/dL). Liver Function Tests: No results for input(s): "AST", "ALT", "ALKPHOS", "BILITOT", "PROT", "ALBUMIN" in the last 168 hours. No results for input(s): "LIPASE", "AMYLASE" in the last 168 hours. No results for input(s): "AMMONIA" in the last 168 hours. Coagulation Profile: No results for input(s): "INR", "PROTIME" in the last 168 hours. Cardiac Enzymes: No results for input(s): "CKTOTAL", "CKMB", "CKMBINDEX", "TROPONINI" in the last 168 hours. BNP (last 3 results) No results for input(s): "PROBNP" in the last 8760 hours. HbA1C: No results for input(s): "HGBA1C" in the last 72 hours. CBG: No results for  input(s): "GLUCAP" in the last 168 hours. Lipid Profile: No results for input(s): "CHOL", "HDL", "LDLCALC", "TRIG", "CHOLHDL", "LDLDIRECT" in the last 72 hours. Thyroid  Function Tests: No results for input(s): "TSH", "T4TOTAL", "FREET4", "T3FREE", "THYROIDAB" in the last 72 hours. Anemia Panel: No results for input(s): "VITAMINB12", "FOLATE", "FERRITIN", "TIBC", "IRON", "RETICCTPCT" in the last 72 hours. Urine analysis: No results found for: "COLORURINE", "APPEARANCEUR", "LABSPEC", "PHURINE", "GLUCOSEU", "HGBUR", "BILIRUBINUR", "KETONESUR", "PROTEINUR", "UROBILINOGEN", "NITRITE", "LEUKOCYTESUR"  Radiological Exams on Admission: DG Chest Portable 1 View Result Date: 05/15/2023 CLINICAL DATA:  SOB EXAM: PORTABLE CHEST 1 VIEW COMPARISON:  Chest x-ray 05/12/2023, CT abdomen pelvis 04/26/2023 FINDINGS:  The heart and mediastinal contours are unchanged. Left base atelectasis. No focal consolidation. No pulmonary edema. No pleural effusion. No pneumothorax. No acute osseous abnormality. IMPRESSION: No active disease. Electronically Signed   By: Morgane  Naveau M.D.   On: 05/15/2023 00:39   Data Reviewed for HPI: Relevant notes from primary care and specialist visits, past discharge summaries as available in EHR, including Care Everywhere. Prior diagnostic testing as pertinent to current admission diagnoses Updated medications and problem lists for reconciliation ED course, including vitals, labs, imaging, treatment and response to treatment Triage notes, nursing and pharmacy notes and ED provider's notes Notable results as noted above in HPI      Assessment and Plan: * Severe asthma with acute exacerbation History of prior intubations and frequent hospitalizations OSA/OHS-not currently on CPAP Schedule and as needed nebulized bronchodilators IV steroids Continue BiPAP for now and wean as tolerated  Obesity, Class III, BMI 40-49.9 (morbid obesity) Complicating factor to overall prognosis  and care  HTN (hypertension) Continue home amlodipine  and metoprolol   Tobacco use disorder Nicotine  patch    DVT prophylaxis: Lovenox   Consults: none  Advance Care Planning:   Code Status: Prior   Family Communication: Wife at bedside  Disposition Plan: Back to previous home environment  Severity of Illness: The appropriate patient status for this patient is OBSERVATION. Observation status is judged to be reasonable and necessary in order to provide the required intensity of service to ensure the patient's safety. The patient's presenting symptoms, physical exam findings, and initial radiographic and laboratory data in the context of their medical condition is felt to place them at decreased risk for further clinical deterioration. Furthermore, it is anticipated that the patient will be medically stable for discharge from the hospital within 2 midnights of admission.   Author: Lanetta Pion, MD 05/15/2023 1:53 AM  For on call review [email or web address].

## 2023-05-15 NOTE — Assessment & Plan Note (Addendum)
 History of prior intubations and frequent hospitalizations OSA/OHS-not currently on CPAP Schedule and as needed nebulized bronchodilators IV steroids Continue BiPAP for now and wean as tolerated

## 2023-05-15 NOTE — Assessment & Plan Note (Signed)
 Complicating factor to overall prognosis and care

## 2023-05-16 ENCOUNTER — Other Ambulatory Visit: Payer: Self-pay

## 2023-05-16 DIAGNOSIS — E66813 Obesity, class 3: Secondary | ICD-10-CM

## 2023-05-16 LAB — BASIC METABOLIC PANEL WITH GFR
Anion gap: 7 (ref 5–15)
BUN: 17 mg/dL (ref 6–20)
CO2: 27 mmol/L (ref 22–32)
Calcium: 8.9 mg/dL (ref 8.9–10.3)
Chloride: 102 mmol/L (ref 98–111)
Creatinine, Ser: 0.89 mg/dL (ref 0.61–1.24)
GFR, Estimated: >60 mL/min (ref >60)
Glucose, Bld: 143 mg/dL — ABNORMAL HIGH (ref 70–99)
Potassium: 4.1 mmol/L (ref 3.5–5.1)
Sodium: 136 mmol/L (ref 135–145)

## 2023-05-16 LAB — CBC
HCT: 36.8 % — ABNORMAL LOW (ref 39.0–52.0)
Hemoglobin: 11.7 g/dL — ABNORMAL LOW (ref 13.0–17.0)
MCH: 27.9 pg (ref 26.0–34.0)
MCHC: 31.8 g/dL (ref 30.0–36.0)
MCV: 87.8 fL (ref 80.0–100.0)
Platelets: 329 10*3/uL (ref 150–400)
RBC: 4.19 MIL/uL — ABNORMAL LOW (ref 4.22–5.81)
RDW: 15 % (ref 11.5–15.5)
WBC: 12.8 10*3/uL — ABNORMAL HIGH (ref 4.0–10.5)
nRBC: 0 % (ref 0.0–0.2)

## 2023-05-16 MED ORDER — DIPHENHYDRAMINE HCL 25 MG PO CAPS
25.0000 mg | ORAL_CAPSULE | Freq: Four times a day (QID) | ORAL | Status: DC | PRN
  Administered 2023-05-16: 25 mg via ORAL
  Filled 2023-05-16: qty 1

## 2023-05-16 MED ORDER — GUAIFENESIN ER 600 MG PO TB12
600.0000 mg | ORAL_TABLET | Freq: Two times a day (BID) | ORAL | 0 refills | Status: AC
  Filled 2023-05-16: qty 10, 5d supply, fill #0

## 2023-05-16 MED ORDER — FUROSEMIDE 40 MG PO TABS
40.0000 mg | ORAL_TABLET | Freq: Every day | ORAL | 0 refills | Status: DC
  Filled 2023-05-16: qty 30, 30d supply, fill #0

## 2023-05-16 MED ORDER — NICOTINE 14 MG/24HR TD PT24
14.0000 mg | MEDICATED_PATCH | Freq: Every day | TRANSDERMAL | 0 refills | Status: AC | PRN
  Filled 2023-05-16: qty 28, 28d supply, fill #0

## 2023-05-16 MED ORDER — MONTELUKAST SODIUM 10 MG PO TABS
10.0000 mg | ORAL_TABLET | Freq: Every day | ORAL | 0 refills | Status: DC
Start: 2023-05-16 — End: 2023-09-06
  Filled 2023-05-16: qty 30, 30d supply, fill #0

## 2023-05-16 MED ORDER — IPRATROPIUM-ALBUTEROL 0.5-2.5 (3) MG/3ML IN SOLN
3.0000 mL | Freq: Four times a day (QID) | RESPIRATORY_TRACT | 2 refills | Status: DC | PRN
  Filled 2023-05-16: qty 90, 8d supply, fill #0

## 2023-05-16 MED ORDER — DOXYCYCLINE HYCLATE 100 MG PO TABS
100.0000 mg | ORAL_TABLET | Freq: Two times a day (BID) | ORAL | 0 refills | Status: AC
  Filled 2023-05-16: qty 6, 3d supply, fill #0

## 2023-05-16 MED ORDER — FLUTICASONE-SALMETEROL 250-50 MCG/ACT IN AEPB
1.0000 | INHALATION_SPRAY | Freq: Two times a day (BID) | RESPIRATORY_TRACT | 1 refills | Status: DC
  Filled 2023-05-16: qty 60, 30d supply, fill #0

## 2023-05-16 MED ORDER — ALBUTEROL SULFATE HFA 108 (90 BASE) MCG/ACT IN AERS
2.0000 | INHALATION_SPRAY | RESPIRATORY_TRACT | 1 refills | Status: DC | PRN
  Filled 2023-05-16: qty 6.7, 16d supply, fill #0

## 2023-05-16 MED ORDER — PREDNISONE 10 MG PO TABS
ORAL_TABLET | ORAL | 0 refills | Status: AC
  Filled 2023-05-16: qty 21, 6d supply, fill #0

## 2023-05-16 MED ORDER — PANTOPRAZOLE SODIUM 40 MG PO TBEC
40.0000 mg | DELAYED_RELEASE_TABLET | Freq: Every day | ORAL | 1 refills | Status: DC
  Filled 2023-05-16: qty 30, 30d supply, fill #0

## 2023-05-16 NOTE — TOC Initial Note (Addendum)
 Transition of Care [facility]) - Initial/Assessment Note    Patient Details  Name: Justin Reid MRN: [id number] Date of Birth: December 24, 1980  Transition of Care [facility]) CM/SW Contact:    Justin Haddock, LCSW Phone Number: 05/16/2023, 1:39 PM  Clinical Narrative:   CSW met with patient at bedside with his SO present Justin Reid).  CSW introduced self and reason for visit.  Demographic information verified.  Patient is currently uninsured, however, he states that he has applied for Medicaid and is waiting for approval.  Patient provided [facility] application by CSW due to him not having a PCP.  Patient states that he uses [facility] for his pharmacy needs.  He reports that he has a Nebulizer machine, walker and cane at home.  Prior to coming to the ED, patient states that, at baseline, he could complete his ADL's.  CSW received consult for social connections resources.  CSW added to AVS.  Due to patient not having insurance and needing medications for discharge, CSW reached out to the pharmacist at [facility] for meds to be delivered to the bed.    No other TOC needs at this time.  TOC signing off.               4:20pm  CSW was sent an Epic chat to see about patient getting O2 for discharge.  Justin Reid and RN Justin Reid told that patient would need to put a credit card on file.  CSW contacted [employer] to inquire about providing O2 for patient (via text).  Justin Reid, from [employer], stated that he needed patient's DOB, walk test and order.  These things were completed and CSW informed [employer].    CSW informed Justin Reid and RN Justin Reid that [employer] rep would visit patient to deliver O2 and discuss him needing to put a credit card on file.  When rep stated that he was on the way, CSW realized that patient was already discharged.  CSW asked his whereabouts from RN and MD. CSW informed that patient left.  CSW informed [employer] and tried to reach out to patient via phone [phone]), but to no avail.  CSW tried calling his  girlfriend, Justin Reid [phone]), but there was no answer.  Information.  CSW updated Justin Reid at [employer]. He states that they will take the O2 to patient's home.         Barriers to Discharge: ED Medication assistance   Patient Goals and CMS Choice            Expected Discharge Plan and Services       Living arrangements for the past 2 months: Single Family Home Expected Discharge Date: 05/16/23                                    Prior Living Arrangements/Services Living arrangements for the past 2 months: Single Family Home Lives with:: Significant Other Patient language and need for interpreter reviewed:: Yes Do you feel safe going back to the place where you live?: Yes        Care giver support system in place?: Yes (comment)   Criminal Activity/Legal Involvement Pertinent to Current Situation/Hospitalization: No - Comment as needed  Activities of Daily Living   ADL Screening (condition at time of admission) Independently performs ADLs?: Yes (appropriate for developmental age) Is the patient deaf or have difficulty hearing?: No Does the patient have difficulty seeing, even when wearing  glasses/contacts?: No Does the patient have difficulty concentrating, remembering, or making decisions?: No  Permission Sought/Granted                  Emotional Assessment Appearance:: Appears stated age Attitude/Demeanor/Rapport: Gracious Affect (typically observed): Appropriate Orientation: : Oriented to Self, Oriented to Place, Oriented to  Time, Oriented to Situation Alcohol / Substance Use: Not Applicable Psych Involvement: No (comment)  Admission diagnosis:  Severe asthma with acute exacerbation [J45.901] Patient Active Problem List   Diagnosis Date Noted   Severe asthma with acute exacerbation 05/15/2023   Bilateral knee effusions 04/29/2023   Acute asthma exacerbation 04/26/2023   Atypical chest pain 04/20/2023   COPD exacerbation (HCC) 04/19/2023    Pleuritic chest pain 04/09/2023   GERD (gastroesophageal reflux disease) 04/09/2023   Asthma exacerbation 04/01/2023   Vocal cord dysfunction 04/01/2023   Obesity, Class III, BMI 40-49.9 (morbid obesity) 02/21/2023   Tobacco use 02/21/2023   Acute respiratory failure with hypoxia (HCC) 01/20/2023   HTN (hypertension) 01/20/2023   Tobacco use disorder 12/28/2021   Obesity (BMI 30-39.9) 12/28/2021   OSA (obstructive sleep apnea) 12/28/2021   At risk for sleep apnea 11/10/2020   Obesity hypoventilation syndrome (HCC) 11/10/2020   Truncal obesity 11/10/2020   Acute severe exacerbation of severe persistent asthma 02/20/2016   PCP:  Justin Reid, No Pharmacy:   [facility] [address] [place] - [address] [place] [address] Phone: [phone] Fax: [phone]     Social Drivers of Health (SDOH) Social History: SDOH Screenings   Food Insecurity: No Food Insecurity (05/15/2023)  Recent Concern: Food Insecurity - Food Insecurity Present (03/25/2023)  Housing: Low Risk  (05/15/2023)  Recent Concern: Housing - High Risk (03/25/2023)  Transportation Needs: No Transportation Needs (05/15/2023)  Utilities: Not At Risk (05/15/2023)  Recent Concern: Utilities - At Risk (03/07/2023)  Social Connections: Moderately Isolated (05/15/2023)  Tobacco Use: High Risk (05/14/2023)   SDOH Interventions:     Readmission Risk Interventions    04/03/2023   12:24 PM 03/08/2023    1:02 PM  Readmission Risk Prevention Plan  Transportation Screening Complete Complete  PCP or Specialist Appt within 3-5 Days Complete --  HRI or Home Care Consult Complete Complete  Social Work Consult for Recovery Care Planning/Counseling Complete Complete  Palliative Care Screening Not Applicable Not Applicable  Medication Review Oceanographer) Complete Referral to Pharmacy

## 2023-05-16 NOTE — Discharge Instructions (Signed)

## 2023-05-16 NOTE — ED Notes (Signed)
 Pt declined Home Oxygen  related to the need for a credit card to be on file, pt "states I don't have a credit card to place on file and I have to go get my daughter. I can't wait." MD aware.

## 2023-05-16 NOTE — Discharge Summary (Signed)
 Physician Discharge Summary   Patient: Justin Reid MRN: [id number] DOB: October 13, 1980  Admit date:     05/14/2023  Discharge date: 05/16/23  Discharge Physician: Brenna Cam   PCP: Pcp, No   Recommendations at discharge:    F/up with outpt providers as requested  Discharge Diagnoses: Principal Problem:   Severe asthma with acute exacerbation Active Problems:   OSA (obstructive sleep apnea)   HTN (hypertension)   Obesity, Class III, BMI 40-49.9 (morbid obesity)   Tobacco use disorder  Hospital Course: Assessment and Plan:  43 y.o. male with medical history significant for Severe asthma with prior intubation, HTN, morbid obesity, OSA (not on CPAP at home), frequent hospitalizations for asthma exacerbation, most recently 05/12/2023, signing out AMA the following day due to a family emergency who returns to the ED with worsening symptoms and increased work or breathing requiring BiPAP   * Severe asthma with acute exacerbation History of prior intubations and frequent hospitalizations OSA/OHS-not currently on CPAP Schedule and as needed nebulized bronchodilators Improved with steroids and is at baseline He was seen by Pulmo Dr Jaclynn Mast and cleared for DC Initially needed BiPAP and now on 2 liters. He needs 2 liters O2 upon ambulation. Before TOC could set up O2 - he left.  Obesity, Class III, BMI 40-49.9 (morbid obesity) Complicating factor to overall prognosis and care  HTN (hypertension) Continue home amlodipine  and metoprolol   Tobacco use disorder Nicotine  patch  Multiple Readmissions He's had 9 readmissions and 3 ED visits in last 6 months.       Consultants: Pulmo  Disposition: Home Diet recommendation:  Discharge Diet Orders (From admission, onward)     Start     Ordered   05/16/23 0000  Diet - low sodium heart healthy        05/16/23 1326           Carb modified diet DISCHARGE MEDICATION: Allergies as of 05/16/2023   No Known Allergies       Medication List     STOP taking these medications    Dextromethorphan -guaiFENesin  20-200 MG/20ML Liqd       TAKE these medications    acetaminophen  325 MG tablet Commonly known as: TYLENOL  Take 2 tablets (650 mg total) by mouth every 6 (six) hours as needed for mild pain (pain score 1-3) or fever.   albuterol  108 (90 Base) MCG/ACT inhaler Commonly known as: VENTOLIN  HFA Inhale 2 puffs into the lungs every 4 (four) hours as needed for wheezing or shortness of breath.   amLODipine  5 MG tablet Commonly known as: NORVASC  Take 1 tablet (5 mg total) by mouth daily.   doxycycline  100 MG tablet Commonly known as: VIBRA -TABS Take 1 tablet (100 mg total) by mouth 2 (two) times daily for 3 days.   furosemide  40 MG tablet Commonly known as: LASIX  Take 1 tablet (40 mg total) by mouth daily.   guaiFENesin  600 MG 12 hr tablet Commonly known as: MUCINEX  Take 1 tablet (600 mg total) by mouth 2 (two) times daily for 5 days.   ipratropium-albuterol  0.5-2.5 (3) MG/3ML Soln Commonly known as: DUONEB Take 3 mLs by nebulization every 6 (six) hours as needed.   loratadine  10 MG tablet Commonly known as: CLARITIN  Take 1 tablet (10 mg total) by mouth daily.   metoprolol  succinate 50 MG 24 hr tablet Commonly known as: TOPROL -XL Take 1 tablet (50 mg total) by mouth daily. Take with or immediately following a meal.   montelukast  10 MG tablet Commonly known as:  SINGULAIR  Take 1 tablet (10 mg total) by mouth daily.   nicotine  14 mg/24hr patch Commonly known as: NICODERM CQ  - dosed in mg/24 hours Place 1 patch (14 mg total) onto the skin daily as needed for up to 28 days (nicotine  craving).   pantoprazole  40 MG tablet Commonly known as: PROTONIX  Take 1 tablet (40 mg total) by mouth daily.   predniSONE  10 MG tablet Commonly known as: DELTASONE  Take 6 tablets (60 mg total) by mouth daily for 1 day, THEN 5 tablets (50 mg total) daily for 1 day, THEN 4 tablets (40 mg total) daily for 1  day, THEN 3 tablets (30 mg total) daily for 1 day, THEN 2 tablets (20 mg total) daily for 1 day, THEN 1 tablet (10 mg total) daily for 1 day. Start taking on: May 16, 2023   Wixela Inhub  250-50 MCG/ACT Aepb Generic drug: fluticasone -salmeterol Inhale 1 puff into the lungs in the morning and at bedtime.        Follow-up Information     Erskin Hearing, MD. Schedule an appointment as soon as possible for a visit in 1 week(s).   Specialty: Pulmonary Disease Why: [facility] Discharge F/UP Contact information: [address] [place] [address] [phone]                Discharge Exam: Cleavon Curls Weights   05/14/23 2303  Weight: (!) 140.6 kg   Constitutional:      Appearance: He is morbidly obese.  HENT:     Head: Normocephalic and atraumatic.  Cardiovascular:     Rate and Rhythm: Normal rate and regular rhythm.     Heart sounds: Normal heart sounds.  Pulmonary:     CTA b/l Abdominal:     Palpations: Abdomen is soft.     Tenderness: There is no abdominal tenderness.  Neurological:     Mental Status: Mental status is at baseline.   Condition at discharge: fair  The results of significant diagnostics from this hospitalization (including imaging, microbiology, ancillary and laboratory) are listed below for reference.   Imaging Studies: DG Chest Portable 1 View Result Date: 05/15/2023 CLINICAL DATA:  SOB EXAM: PORTABLE CHEST 1 VIEW COMPARISON:  Chest x-ray 05/12/2023, CT abdomen pelvis 04/26/2023 FINDINGS: The heart and mediastinal contours are unchanged. Left base atelectasis. No focal consolidation. No pulmonary edema. No pleural effusion. No pneumothorax. No acute osseous abnormality. IMPRESSION: No active disease. Electronically Signed   By: Morgane  Naveau M.D.   On: 05/15/2023 00:39   US  Venous Img Lower Bilateral Result Date: 05/12/2023 CLINICAL DATA:  Left leg swelling EXAM: LEFT LOWER EXTREMITY VENOUS DOPPLER ULTRASOUND TECHNIQUE: Gray-scale sonography  with compression, as well as color and duplex ultrasound, were performed to evaluate the deep venous system(s) from the level of the common femoral vein through the popliteal and proximal calf veins. COMPARISON:  None Available. FINDINGS: VENOUS Normal compressibility of the common femoral, superficial femoral, and popliteal veins, as well as the visualized calf veins. Visualized portions of profunda femoral vein and great saphenous vein unremarkable. No filling defects to suggest DVT on grayscale or color Doppler imaging. Doppler waveforms show normal direction of venous flow, normal respiratory plasticity and response to augmentation. Limited views of the contralateral common femoral vein are unremarkable. OTHER Probable moderate left knee joint effusion. Limitations: Peroneal vein in the left calf not visualized. IMPRESSION: No evidence of left lower extremity DVT Left knee joint effusion. Electronically Signed   By: Janeece Mechanic M.D.   On: 05/12/2023  18:49   DG Chest 2 View Result Date: 05/12/2023 CLINICAL DATA:  Shortness of breath. EXAM: CHEST - 2 VIEW COMPARISON:  April 25, 2023. FINDINGS: The heart size and mediastinal contours are within normal limits. Both lungs are clear. The visualized skeletal structures are unremarkable. IMPRESSION: No active cardiopulmonary disease. Electronically Signed   By: Rosalene Colon M.D.   On: 05/12/2023 15:28   CT ABDOMEN PELVIS W CONTRAST Result Date: 04/26/2023 CLINICAL DATA:  Right lower quadrant abdominal pain EXAM: CT ABDOMEN AND PELVIS WITH CONTRAST TECHNIQUE: Multidetector CT imaging of the abdomen and pelvis was performed using the standard protocol following bolus administration of intravenous contrast. RADIATION DOSE REDUCTION: This exam was performed according to the departmental dose-optimization program which includes automated exposure control, adjustment of the mA and/or kV according to patient size and/or use of iterative reconstruction technique.  CONTRAST:  OMNIPAQUE  IOHEXOL  350 MG/ML SOLN COMPARISON:  CT 04/13/2022 FINDINGS: Lower chest: No acute abnormality. Hepatobiliary: Unremarkable liver, gallbladder, and biliary tree. Pancreas: Unremarkable. Spleen: Unremarkable. Adrenals/Urinary Tract: Normal adrenal glands. Low attenuation cyst with peripheral punctate calcification in the left kidney. No follow-up recommended. No urinary calculi or hydronephrosis. Unremarkable bladder. Stomach/Bowel: Normal caliber large and small bowel. Stomach is within normal limits. No bowel wall thickening. The appendix is normal. Vascular/Lymphatic: No significant vascular findings are present. No enlarged abdominal or pelvic lymph nodes. Reproductive: Unremarkable. Other: No free intraperitoneal fluid or air. Musculoskeletal: No acute fracture. Soft tissue nodules in the ventral abdominal wall subcutaneous fat likely related to injections. IMPRESSION: No acute abnormality in the abdomen or pelvis. Normal appendix. Electronically Signed   By: Rozell Cornet M.D.   On: 04/26/2023 19:54   US  Venous Img Lower Bilateral (DVT) Result Date: 04/26/2023 CLINICAL DATA:  Bilateral lower extremity edema for 2 days EXAM: BILATERAL LOWER EXTREMITY VENOUS DOPPLER ULTRASOUND TECHNIQUE: Gray-scale sonography with graded compression, as well as color Doppler and duplex ultrasound were performed to evaluate the lower extremity deep venous systems from the level of the common femoral vein and including the common femoral, femoral, profunda femoral, popliteal and calf veins including the posterior tibial, peroneal and gastrocnemius veins when visible. The superficial great saphenous vein was also interrogated. Spectral Doppler was utilized to evaluate flow at rest and with distal augmentation maneuvers in the common femoral, femoral and popliteal veins. COMPARISON:  Ultrasound 02/21/2023 FINDINGS: RIGHT LOWER EXTREMITY Common Femoral Vein: No evidence of thrombus. Normal  compressibility, respiratory phasicity and response to augmentation. Saphenofemoral Junction: No evidence of thrombus. Normal compressibility and flow on color Doppler imaging. Profunda Femoral Vein: No evidence of thrombus. Normal compressibility and flow on color Doppler imaging. Femoral Vein: No evidence of thrombus. Normal compressibility, respiratory phasicity and response to augmentation. Popliteal Vein: No evidence of thrombus. Normal compressibility, respiratory phasicity and response to augmentation. Calf Veins: No evidence of thrombus. Normal compressibility and flow on color Doppler imaging. Superficial Great Saphenous Vein: No evidence of thrombus. Normal compressibility. Venous Reflux:  None. Other Findings: There is a oblong fluid collection identified without flow on Doppler lateral to the knee measuring 7.6 x 2.4 x 7.0 cm. LEFT LOWER EXTREMITY Common Femoral Vein: No evidence of thrombus. Normal compressibility, respiratory phasicity and response to augmentation. Saphenofemoral Junction: No evidence of thrombus. Normal compressibility and flow on color Doppler imaging. Profunda Femoral Vein: No evidence of thrombus. Normal compressibility and flow on color Doppler imaging. Femoral Vein: No evidence of thrombus. Normal compressibility, respiratory phasicity and response to augmentation. Popliteal Vein: No evidence of  thrombus. Normal compressibility, respiratory phasicity and response to augmentation. Calf Veins: No evidence of thrombus. Normal compressibility and flow on color Doppler imaging. Superficial Great Saphenous Vein: No evidence of thrombus. Normal compressibility. Venous Reflux:  None. Other Findings: Oblong fluid collection lateral to the knee identified measuring 8.3 x 2.2 x 12.6 cm. IMPRESSION: No evidence of bilateral lower extremity DVT. There or fluid collections lateral to both knees measuring up to 7.6 cm on the right and 8.3 x 12.6 cm on the left. Please correlate for any history  specifically including trauma or other process. Otherwise recommend further evaluation such as CT or MRI when clinically appropriate to better define these fluid collections. They have a broad differential. These were not seen on the prior study of February 2025. Electronically Signed   By: Adrianna Horde M.D.   On: 04/26/2023 16:34   DG Chest Port 1 View Result Date: 04/25/2023 CLINICAL DATA:  Shortness of breath. EXAM: PORTABLE CHEST 1 VIEW COMPARISON:  04/19/2023. FINDINGS: Low lung volumes with associated accentuation of the cardiac silhouette. Mediastinal contours are within normal limits. No focal consolidation, sizeable pleural effusion, or pneumothorax. Visualized osseous structures are unchanged. IMPRESSION: Low lung volumes.  No acute cardiopulmonary findings. Electronically Signed   By: Mannie Seek M.D.   On: 04/25/2023 17:47   DG Chest 2 View Result Date: 04/19/2023 CLINICAL DATA:  cp, sob EXAM: CHEST - 2 VIEW COMPARISON:  04/09/2023 FINDINGS: Lungs are clear.  No pneumothorax. Heart size and mediastinal contours are within normal limits. No effusion. Possible free osteochondral bodies in the left shoulder. IMPRESSION: No acute cardiopulmonary disease. Electronically Signed   By: Nicoletta Barrier M.D.   On: 04/19/2023 17:54    Microbiology: Results for orders placed or performed during the hospital encounter of 05/14/23  Resp panel by RT-PCR (RSV, Flu A&B, Covid) Anterior Nasal Swab     Status: None   Collection Time: 05/14/23 11:23 PM   Specimen: Anterior Nasal Swab  Result Value Ref Range Status   SARS Coronavirus 2 by RT PCR NEGATIVE NEGATIVE Final    Comment: (NOTE) SARS-CoV-2 target nucleic acids are NOT DETECTED.  The SARS-CoV-2 RNA is generally detectable in upper respiratory specimens during the acute phase of infection. The lowest concentration of SARS-CoV-2 viral copies this assay can detect is 138 copies/mL. A negative result does not preclude SARS-Cov-2 infection and  should not be used as the sole basis for treatment or other patient management decisions. A negative result may occur with  improper specimen collection/handling, submission of specimen other than nasopharyngeal swab, presence of viral mutation(s) within the areas targeted by this assay, and inadequate number of viral copies(<138 copies/mL). A negative result must be combined with clinical observations, patient history, and epidemiological information. The expected result is Negative.  Fact Sheet for Patients:  BloggerCourse.com  Fact Sheet for Healthcare Providers:  SeriousBroker.it  This test is no t yet approved or cleared by the [place]  FDA and  has been authorized for detection and/or diagnosis of SARS-CoV-2 by FDA under an Emergency Use Authorization (EUA). This EUA will remain  in effect (meaning this test can be used) for the duration of the COVID-19 declaration under Section 564(b)(1) of the Act, 21 U.S.C.section 360bbb-3(b)(1), unless the authorization is terminated  or revoked sooner.       Influenza A by PCR NEGATIVE NEGATIVE Final   Influenza B by PCR NEGATIVE NEGATIVE Final    Comment: (NOTE) The Xpert Xpress SARS-CoV-2/FLU/RSV plus assay is intended as an  aid in the diagnosis of influenza from Nasopharyngeal swab specimens and should not be used as a sole basis for treatment. Nasal washings and aspirates are unacceptable for Xpert Xpress SARS-CoV-2/FLU/RSV testing.  Fact Sheet for Patients: BloggerCourse.com  Fact Sheet for Healthcare Providers: SeriousBroker.it  This test is not yet approved or cleared by the [place]  FDA and has been authorized for detection and/or diagnosis of SARS-CoV-2 by FDA under an Emergency Use Authorization (EUA). This EUA will remain in effect (meaning this test can be used) for the duration of the COVID-19 declaration  under Section 564(b)(1) of the Act, 21 U.S.C. section 360bbb-3(b)(1), unless the authorization is terminated or revoked.     Resp Syncytial Virus by PCR NEGATIVE NEGATIVE Final    Comment: (NOTE) Fact Sheet for Patients: BloggerCourse.com  Fact Sheet for Healthcare Providers: SeriousBroker.it  This test is not yet approved or cleared by the [place]  FDA and has been authorized for detection and/or diagnosis of SARS-CoV-2 by FDA under an Emergency Use Authorization (EUA). This EUA will remain in effect (meaning this test can be used) for the duration of the COVID-19 declaration under Section 564(b)(1) of the Act, 21 U.S.C. section 360bbb-3(b)(1), unless the authorization is terminated or revoked.  Performed at [facility], [address] [place], [place] [address]     Labs: CBC: Recent Labs  Lab 05/12/23 1449 05/14/23 2315 05/16/23 0602  WBC 5.8 9.6 12.8*  NEUTROABS 3.3 6.0  --   HGB 12.6* 12.8* 11.7*  HCT 39.0 39.7 36.8*  MCV 85.9 85.9 87.8  PLT 338 383 329   Basic Metabolic Panel: Recent Labs  Lab 05/12/23 1449 05/12/23 1538 05/14/23 2315 05/16/23 0602  NA 138  --  136 136  K 3.5  --  3.7 4.1  CL 103  --  103 102  CO2 27  --  24 27  GLUCOSE 100*  --  90 143*  BUN 13  --  19 17  CREATININE 0.88  --  1.12 0.89  CALCIUM 8.9  --  8.9 8.9  MG  --  2.1  --   --    Liver Function Tests: No results for input(s): "AST", "ALT", "ALKPHOS", "BILITOT", "PROT", "ALBUMIN" in the last 168 hours. CBG: No results for input(s): "GLUCAP" in the last 168 hours.  Discharge time spent: greater than 30 minutes.  Signed: Brenna Cam, MD [facility] 05/16/2023

## 2023-05-16 NOTE — Consult Note (Signed)
 PULMONOLOGY         Date: 05/16/2023,   MRN# [id number] Justin Reid 20-Nov-1980     AdmissionWeight: (!) 140.6 kg                 CurrentWeight: (!) 140.6 kg  Referring provider: Dr Mason Sole   CHIEF COMPLAINT:   Acute Asthma/COPD exacerbation    HISTORY OF PRESENT ILLNESS    HPI: Justin Reid is a 43 y.o. male with medical history significant for COPD and asthma with prior intubation, HTN, morbid obesity, OSA (not on CPAP at home), most recently 05/12/2023, signing out AMA , who returns to the ED with worsening symptoms and labored breathing. He denies chest pain, fever or chills, lower extremity pain or swelling. ED course and data review: BP 166/107 with pulse 90, respiratory rate 22 and O2 sats 95% on room air VBG with pH 7.46 and PCO237 CBC and BMP unremarkable Troponin 6 Respiratory viral panel negative for COVID flu and RSV. Chest x-ray with no active disease.  He is resting during my evaluation and is in no distress.      PAST MEDICAL HISTORY   Past Medical History:  Diagnosis Date   2019-nCoV vaccination declined 11/10/2020   Arthritis    Asthma    Hypertension    Morbid obesity (HCC)    Shingles    Tobacco use      SURGICAL HISTORY   History reviewed. No pertinent surgical history.   FAMILY HISTORY   Family History  Problem Relation Age of Onset   Hypertension Father      SOCIAL HISTORY   Social History   Tobacco Use   Smoking status: Some Days    Current packs/day: 0.50    Average packs/day: 0.5 packs/day for 5.0 years (2.5 ttl pk-yrs)    Types: Cigarettes   Smokeless tobacco: Never  Vaping Use   Vaping status: Never Used  Substance Use Topics   Alcohol use: Yes   Drug use: No     MEDICATIONS    Home Medication:  Current Outpatient Rx   Order #: [id number] Class: OTC   Order #: [id number] Class: Normal   Order #: [id number] Class: Normal   Order #: [id number] Class: OTC   Order #: [id number] Class: Normal   Order #:  [id number] Class: Normal   Order #: [id number] Class: Normal   Order #: [id number] Class: Normal   Order #: [id number] Class: Normal   Order #: [id number] Class: Normal   Order #: [id number] Class: Normal   Order #: [id number] Class: Normal    Current Medication:  Current Facility-Administered Medications:    acetaminophen  (TYLENOL ) tablet 650 mg, 650 mg, Oral, Q6H PRN **OR** acetaminophen  (TYLENOL ) suppository 650 mg, 650 mg, Rectal, Q6H PRN, Lanetta Pion, MD   albuterol  (PROVENTIL ) (2.5 MG/3ML) 0.083% nebulizer solution 2.5 mg, 2.5 mg, Nebulization, Q2H PRN, Brion Cancel V, MD, 2.5 mg at 05/15/23 1206   amLODipine  (NORVASC ) tablet 5 mg, 5 mg, Oral, Daily, Brion Cancel V, MD, 5 mg at 05/15/23 0908   diphenhydrAMINE  (BENADRYL ) capsule 25 mg, 25 mg, Oral, Q6H PRN, Lanetta Pion, MD, 25 mg at 05/16/23 0026   enoxaparin  (LOVENOX ) injection 70 mg, 70 mg, Subcutaneous, Q24H, Brion Cancel V, MD, 70 mg at 05/15/23 0910   guaiFENesin  (MUCINEX ) 12 hr tablet 600 mg, 600 mg, Oral, BID, Brion Cancel V, MD, 600 mg at 05/15/23 2117   HYDROcodone -acetaminophen  (NORCO/VICODIN) 5-325 MG per tablet 1-2 tablet, 1-2 tablet, Oral, Q4H PRN, Lanetta Pion, MD, 1 tablet at  05/15/23 2336   ipratropium-albuterol  (DUONEB) 0.5-2.5 (3) MG/3ML nebulizer solution 3 mL, 3 mL, Nebulization, Q6H, Lanetta Pion, MD, 3 mL at 05/16/23 0017   loratadine  (CLARITIN ) tablet 10 mg, 10 mg, Oral, Daily, Lanetta Pion, MD, 10 mg at 05/15/23 6962   metoprolol  succinate (TOPROL -XL) 24 hr tablet 50 mg, 50 mg, Oral, Daily, Duncan, Hazel V, MD, 50 mg at 05/15/23 9528   montelukast  (SINGULAIR ) tablet 10 mg, 10 mg, Oral, Daily, Lanetta Pion, MD, 10 mg at 05/15/23 4132   nicotine  (NICODERM CQ  - dosed in mg/24 hours) patch 14 mg, 14 mg, Transdermal, Daily PRN, Lanetta Pion, MD   ondansetron  (ZOFRAN ) tablet 4 mg, 4 mg, Oral, Q6H PRN **OR** ondansetron  (ZOFRAN ) injection 4 mg, 4 mg, Intravenous, Q6H PRN, Lanetta Pion, MD   pantoprazole   (PROTONIX ) EC tablet 40 mg, 40 mg, Oral, Daily, Lanetta Pion, MD, 40 mg at 05/15/23 0907   [COMPLETED] methylPREDNISolone  sodium succinate (SOLU-MEDROL ) 40 mg/mL injection 40 mg, 40 mg, Intravenous, Q12H, 40 mg at 05/15/23 2117 **FOLLOWED BY** predniSONE  (DELTASONE ) tablet 40 mg, 40 mg, Oral, Q breakfast, Lanetta Pion, MD  Current Outpatient Medications:    acetaminophen  (TYLENOL ) 325 MG tablet, Take 2 tablets (650 mg total) by mouth every 6 (six) hours as needed for mild pain (pain score 1-3) or fever., Disp: , Rfl:    albuterol  (VENTOLIN  HFA) 108 (90 Base) MCG/ACT inhaler, Inhale 2 puffs into the lungs every 4 (four) hours as needed for wheezing or shortness of breath., Disp: 6.7 g, Rfl: 1   amLODipine  (NORVASC ) 5 MG tablet, Take 1 tablet (5 mg total) by mouth daily., Disp: 30 tablet, Rfl: 1   Dextromethorphan -guaiFENesin  20-200 MG/20ML LIQD, Take 10 mLs by mouth every 6 (six) hours as needed., Disp: , Rfl:    fluticasone -salmeterol (WIXELA INHUB ) 250-50 MCG/ACT AEPB, Inhale 1 puff into the lungs in the morning and at bedtime., Disp: 60 each, Rfl: 1   furosemide  (LASIX ) 40 MG tablet, Take 1 tablet (40 mg total) by mouth daily., Disp: 30 tablet, Rfl: 0   ipratropium-albuterol  (DUONEB) 0.5-2.5 (3) MG/3ML SOLN, Take 3 mLs by nebulization every 6 (six) hours as needed., Disp: 120 mL, Rfl: 2   loratadine  (CLARITIN ) 10 MG tablet, Take 1 tablet (10 mg total) by mouth daily., Disp: 30 tablet, Rfl: 0   metoprolol  succinate (TOPROL -XL) 50 MG 24 hr tablet, Take 1 tablet (50 mg total) by mouth daily. Take with or immediately following a meal., Disp: 30 tablet, Rfl: 1   montelukast  (SINGULAIR ) 10 MG tablet, Take 1 tablet (10 mg total) by mouth daily., Disp: 30 tablet, Rfl: 0   nicotine  (NICODERM CQ  - DOSED IN MG/24 HOURS) 14 mg/24hr patch, Place 1 patch (14 mg total) onto the skin daily as needed (nicotine  craving)., Disp: 28 patch, Rfl: 0   pantoprazole  (PROTONIX ) 40 MG tablet, Take 1 tablet (40 mg total)  by mouth daily., Disp: 30 tablet, Rfl: 1    ALLERGIES   Patient has no known allergies.     REVIEW OF SYSTEMS    Review of Systems:  Gen:  Denies  fever, sweats, chills weigh loss  HEENT: Denies blurred vision, double vision, ear pain, eye pain, hearing loss, nose bleeds, sore throat Cardiac:  No dizziness, chest pain or heaviness, chest tightness,edema Resp:   reports dyspnea chronically  Gi: Denies swallowing difficulty, stomach pain, nausea or vomiting, diarrhea, constipation, bowel incontinence Gu:  Denies bladder incontinence, burning urine Ext:   Denies Joint  pain, stiffness or swelling Skin: Denies  skin rash, easy bruising or bleeding or hives Endoc:  Denies polyuria, polydipsia , polyphagia or weight change Psych:   Denies depression, insomnia or hallucinations   Other:  All other systems negative   VS: BP 128/79   Pulse 60   Temp 97.9 F (36.6 C) (Oral)   Resp 12   Ht 5\' 10"  (1.778 m)   Wt (!) 140.6 kg   SpO2 100%   BMI 44.48 kg/m      PHYSICAL EXAM    GENERAL:NAD, no fevers, chills, no weakness no fatigue HEAD: Normocephalic, atraumatic.  EYES: Pupils equal, round, reactive to light. Extraocular muscles intact. No scleral icterus.  MOUTH: Moist mucosal membrane. Dentition intact. No abscess noted.  EAR, NOSE, THROAT: Clear without exudates. No external lesions.  NECK: Supple. No thyromegaly. No nodules. No JVD.  PULMONARY: decreased breath sounds with mild rhonchi worse at bases bilaterally.  CARDIOVASCULAR: S1 and S2. Regular rate and rhythm. No murmurs, rubs, or gallops. No edema. Pedal pulses 2+ bilaterally.  GASTROINTESTINAL: Soft, nontender, nondistended. No masses. Positive bowel sounds. No hepatosplenomegaly.  MUSCULOSKELETAL: No swelling, clubbing, or edema. Range of motion full in all extremities.  NEUROLOGIC: Cranial nerves II through XII are intact. No gross focal neurological deficits. Sensation intact. Reflexes intact.  SKIN: No  ulceration, lesions, rashes, or cyanosis. Skin warm and dry. Turgor intact.  PSYCHIATRIC: Mood, affect within normal limits. The patient is awake, alert and oriented x 3. Insight, judgment intact.       IMAGING    ASSESSMENT/PLAN   Dyspnea on exertion     - patient is with non labored breathing at this time with no signs of acute COPD or asthma exacerbation   - he has mild leukocytosis post steroid injection  - he appears to be stable and is able to speak in full sentences.    - his oxygen  saturation was >95% on room air in emergency room  - we did see him in clinic last week due to OSA and he still has not received it.   - I think he can be treated on outpatient basis due to no signs of respiratory insufficiency , normoxia on room air and ability to speak in full sentences without dyspnea.  -he is cleared for dc home from pulmonary perspective             Thank you for allowing me to participate in the care of this patient.   Patient/Family are satisfied with care plan and all questions have been answered.    Provider disclosure: Patient with at least one acute or chronic illness or injury that poses a threat to life or bodily function and is being managed actively during this encounter.  All of the below services have been performed independently by signing provider:  review of prior documentation from internal and or external health records.  Review of previous and current lab results.  Interview and comprehensive assessment during patient visit today. Review of current and previous chest radiographs/CT scans. Discussion of management and test interpretation with health care team and patient/family.   This document was prepared using Dragon voice recognition software and may include unintentional dictation errors.     Shataya Winkles, M.D.  Division of Pulmonary & Critical Care Medicine

## 2023-05-16 NOTE — ED Notes (Signed)
 Phlebotomy called to draw lab

## 2023-05-16 NOTE — ED Notes (Addendum)
 Pt ambulating in the hall without [place] [facility]  in place. Pt O2 saturations dropped to 87%. Pt c/o lightheadedness and ShOB. PT returned to room and sat on side of bed. O2 saturations improved to 98% on Room air.

## 2023-05-30 ENCOUNTER — Emergency Department: Payer: Self-pay

## 2023-05-30 ENCOUNTER — Encounter: Payer: Self-pay | Admitting: Emergency Medicine

## 2023-05-30 ENCOUNTER — Other Ambulatory Visit: Payer: Self-pay

## 2023-05-30 ENCOUNTER — Observation Stay
Admission: EM | Admit: 2023-05-30 | Discharge: 2023-05-31 | Disposition: A | Discharge status: Home / Self Care | Payer: Self-pay | Attending: Internal Medicine | Admitting: Internal Medicine

## 2023-05-30 DIAGNOSIS — J4541 Moderate persistent asthma with (acute) exacerbation: Secondary | ICD-10-CM | POA: Diagnosis not present

## 2023-05-30 DIAGNOSIS — E66813 Obesity, class 3: Secondary | ICD-10-CM | POA: Diagnosis present

## 2023-05-30 DIAGNOSIS — G8929 Other chronic pain: Secondary | ICD-10-CM | POA: Insufficient documentation

## 2023-05-30 DIAGNOSIS — E662 Morbid (severe) obesity with alveolar hypoventilation: Secondary | ICD-10-CM | POA: Diagnosis present

## 2023-05-30 DIAGNOSIS — J45901 Unspecified asthma with (acute) exacerbation: Secondary | ICD-10-CM | POA: Diagnosis present

## 2023-05-30 DIAGNOSIS — G4733 Obstructive sleep apnea (adult) (pediatric): Secondary | ICD-10-CM | POA: Diagnosis present

## 2023-05-30 DIAGNOSIS — R079 Chest pain, unspecified: Secondary | ICD-10-CM | POA: Insufficient documentation

## 2023-05-30 DIAGNOSIS — E65 Localized adiposity: Secondary | ICD-10-CM | POA: Diagnosis present

## 2023-05-30 DIAGNOSIS — Z6841 Body Mass Index (BMI) 40.0 and over, adult: Secondary | ICD-10-CM | POA: Insufficient documentation

## 2023-05-30 DIAGNOSIS — R0789 Other chest pain: Secondary | ICD-10-CM | POA: Diagnosis present

## 2023-05-30 DIAGNOSIS — J4551 Severe persistent asthma with (acute) exacerbation: Principal | ICD-10-CM | POA: Diagnosis present

## 2023-05-30 DIAGNOSIS — Z79899 Other long term (current) drug therapy: Secondary | ICD-10-CM | POA: Insufficient documentation

## 2023-05-30 DIAGNOSIS — Z1152 Encounter for screening for COVID-19: Secondary | ICD-10-CM | POA: Insufficient documentation

## 2023-05-30 DIAGNOSIS — J9601 Acute respiratory failure with hypoxia: Secondary | ICD-10-CM | POA: Diagnosis not present

## 2023-05-30 DIAGNOSIS — Z87891 Personal history of nicotine dependence: Secondary | ICD-10-CM | POA: Insufficient documentation

## 2023-05-30 DIAGNOSIS — I10 Essential (primary) hypertension: Secondary | ICD-10-CM | POA: Diagnosis present

## 2023-05-30 DIAGNOSIS — R059 Cough, unspecified: Secondary | ICD-10-CM | POA: Insufficient documentation

## 2023-05-30 DIAGNOSIS — F17200 Nicotine dependence, unspecified, uncomplicated: Secondary | ICD-10-CM | POA: Diagnosis present

## 2023-05-30 DIAGNOSIS — K219 Gastro-esophageal reflux disease without esophagitis: Secondary | ICD-10-CM | POA: Diagnosis present

## 2023-05-30 DIAGNOSIS — J9621 Acute and chronic respiratory failure with hypoxia: Secondary | ICD-10-CM | POA: Insufficient documentation

## 2023-05-30 LAB — BASIC METABOLIC PANEL WITH GFR
Anion gap: 8 (ref 5–15)
BUN: 20 mg/dL (ref 6–20)
CO2: 27 mmol/L (ref 22–32)
Calcium: 8.8 mg/dL — ABNORMAL LOW (ref 8.9–10.3)
Chloride: 105 mmol/L (ref 98–111)
Creatinine, Ser: 0.86 mg/dL (ref 0.61–1.24)
GFR, Estimated: >60 mL/min (ref >60)
Glucose, Bld: 111 mg/dL — ABNORMAL HIGH (ref 70–99)
Potassium: 3.8 mmol/L (ref 3.5–5.1)
Sodium: 140 mmol/L (ref 135–145)

## 2023-05-30 LAB — CBC
HCT: 39 % (ref 39.0–52.0)
Hemoglobin: 12.3 g/dL — ABNORMAL LOW (ref 13.0–17.0)
MCH: 27.8 pg (ref 26.0–34.0)
MCHC: 31.5 g/dL (ref 30.0–36.0)
MCV: 88.2 fL (ref 80.0–100.0)
Platelets: 337 10*3/uL (ref 150–400)
RBC: 4.42 MIL/uL (ref 4.22–5.81)
RDW: 14.5 % (ref 11.5–15.5)
WBC: 9.3 10*3/uL (ref 4.0–10.5)
nRBC: 0 % (ref 0.0–0.2)

## 2023-05-30 LAB — RESP PANEL BY RT-PCR (RSV, FLU A&B, COVID)  RVPGX2
Influenza A by PCR: NEGATIVE
Influenza B by PCR: NEGATIVE
Resp Syncytial Virus by PCR: NEGATIVE
SARS Coronavirus 2 by RT PCR: NEGATIVE

## 2023-05-30 LAB — TROPONIN I (HIGH SENSITIVITY)
Troponin I (High Sensitivity): 5 ng/L (ref <18)
Troponin I (High Sensitivity): 5 ng/L (ref <18)

## 2023-05-30 MED ORDER — ENOXAPARIN SODIUM 80 MG/0.8ML IJ SOSY
0.5000 mg/kg | PREFILLED_SYRINGE | INTRAMUSCULAR | Status: DC
  Administered 2023-05-31: 72.5 mg via SUBCUTANEOUS
  Filled 2023-05-30: qty 0.72

## 2023-05-30 MED ORDER — ALBUTEROL SULFATE (2.5 MG/3ML) 0.083% IN NEBU
2.5000 mg | INHALATION_SOLUTION | Freq: Once | RESPIRATORY_TRACT | Status: AC
  Administered 2023-05-30: 2.5 mg via RESPIRATORY_TRACT
  Filled 2023-05-30: qty 3

## 2023-05-30 MED ORDER — ACETAMINOPHEN 650 MG RE SUPP: 650.0000 mg | Freq: Four times a day (QID) | RECTAL | Status: DC | PRN

## 2023-05-30 MED ORDER — MAGNESIUM SULFATE 2 GM/50ML IV SOLN
2.0000 g | Freq: Once | INTRAVENOUS | Status: AC
  Administered 2023-05-30: 2 g via INTRAVENOUS
  Filled 2023-05-30: qty 50

## 2023-05-30 MED ORDER — METHYLPREDNISOLONE SODIUM SUCC 40 MG IJ SOLR
40.0000 mg | Freq: Every day | INTRAMUSCULAR | Status: AC
  Administered 2023-05-31: 40 mg via INTRAVENOUS
  Filled 2023-05-30: qty 1

## 2023-05-30 MED ORDER — METHYLPREDNISOLONE SODIUM SUCC 125 MG IJ SOLR
125.0000 mg | Freq: Once | INTRAMUSCULAR | Status: AC
  Administered 2023-05-30: 125 mg via INTRAVENOUS
  Filled 2023-05-30: qty 2

## 2023-05-30 MED ORDER — GUAIFENESIN 100 MG/5ML PO LIQD
5.0000 mL | Freq: Four times a day (QID) | ORAL | Status: DC | PRN
  Administered 2023-05-31: 5 mL via ORAL
  Filled 2023-05-30: qty 10

## 2023-05-30 MED ORDER — ACETAMINOPHEN 325 MG PO TABS
650.0000 mg | ORAL_TABLET | Freq: Four times a day (QID) | ORAL | Status: DC | PRN
  Administered 2023-05-30: 650 mg via ORAL

## 2023-05-30 MED ORDER — HYDRALAZINE HCL 20 MG/ML IJ SOLN: 5.0000 mg | Freq: Four times a day (QID) | INTRAMUSCULAR | Status: DC | PRN

## 2023-05-30 MED ORDER — ONDANSETRON HCL 4 MG PO TABS: 4.0000 mg | ORAL_TABLET | Freq: Four times a day (QID) | ORAL | Status: DC | PRN

## 2023-05-30 MED ORDER — LIDOCAINE 5 % EX PTCH
1.0000 | MEDICATED_PATCH | Freq: Every day | CUTANEOUS | Status: DC | PRN
  Administered 2023-05-30: 1 via TRANSDERMAL
  Filled 2023-05-30: qty 1

## 2023-05-30 MED ORDER — ACETAMINOPHEN 325 MG PO TABS
650.0000 mg | ORAL_TABLET | Freq: Four times a day (QID) | ORAL | Status: DC | PRN
  Filled 2023-05-30: qty 2

## 2023-05-30 MED ORDER — ONDANSETRON HCL 4 MG/2ML IJ SOLN: 4.0000 mg | Freq: Four times a day (QID) | INTRAMUSCULAR | Status: DC | PRN

## 2023-05-30 MED ORDER — PANTOPRAZOLE SODIUM 40 MG PO TBEC
40.0000 mg | DELAYED_RELEASE_TABLET | Freq: Every day | ORAL | Status: DC
  Administered 2023-05-31: 40 mg via ORAL
  Filled 2023-05-30: qty 1

## 2023-05-30 MED ORDER — IBUPROFEN 400 MG PO TABS
800.0000 mg | ORAL_TABLET | Freq: Four times a day (QID) | ORAL | Status: DC | PRN
  Administered 2023-05-30: 800 mg via ORAL
  Filled 2023-05-30: qty 2

## 2023-05-30 MED ORDER — IPRATROPIUM-ALBUTEROL 0.5-2.5 (3) MG/3ML IN SOLN
9.0000 mL | Freq: Once | RESPIRATORY_TRACT | Status: AC
  Administered 2023-05-30: 9 mL via RESPIRATORY_TRACT
  Filled 2023-05-30: qty 9

## 2023-05-30 MED ORDER — IPRATROPIUM-ALBUTEROL 0.5-2.5 (3) MG/3ML IN SOLN
3.0000 mL | Freq: Four times a day (QID) | RESPIRATORY_TRACT | Status: AC
  Administered 2023-05-30 – 2023-05-31 (×3): 3 mL via RESPIRATORY_TRACT
  Filled 2023-05-30 (×3): qty 3

## 2023-05-30 MED ORDER — NICOTINE 14 MG/24HR TD PT24: 14.0000 mg | MEDICATED_PATCH | Freq: Every day | TRANSDERMAL | Status: DC | PRN

## 2023-05-30 MED ORDER — ENOXAPARIN SODIUM 40 MG/0.4ML IJ SOSY: 40.0000 mg | PREFILLED_SYRINGE | INTRAMUSCULAR | Status: DC

## 2023-05-30 MED ORDER — MELATONIN 5 MG PO TABS
5.0000 mg | ORAL_TABLET | Freq: Every evening | ORAL | Status: DC | PRN
  Administered 2023-05-30: 5 mg via ORAL
  Filled 2023-05-30: qty 1

## 2023-05-30 MED ORDER — SENNOSIDES-DOCUSATE SODIUM 8.6-50 MG PO TABS: 1.0000 | ORAL_TABLET | Freq: Every evening | ORAL | Status: DC | PRN

## 2023-05-30 MED ORDER — HYDROCOD POLI-CHLORPHE POLI ER 10-8 MG/5ML PO SUER
5.0000 mL | Freq: Every evening | ORAL | Status: DC | PRN
  Administered 2023-05-30: 5 mL via ORAL
  Filled 2023-05-30: qty 5

## 2023-05-30 NOTE — Assessment & Plan Note (Signed)
 CPAP nightly ordered

## 2023-05-30 NOTE — Assessment & Plan Note (Addendum)
 Patient received Solu-Medrol  nebulizer treatments and magnesium .  Will continue prednisone  upon going home.  Patient improved faster than initially anticipated.  Lungs are clear in the afternoon on 5/27.  Pulse ox 94% on room air with walking.

## 2023-05-30 NOTE — ED Triage Notes (Signed)
 Pt via POV from home. Pt c/o mid-sternal CP and SOB that started yesterday. States today he tried to go to work and could not. Pt has a hx of asthma and did a breathing tx and inhaler this morning with no relief. On arrival, pt has audible expiratory wheezes and labored breathing. Pt is A&Ox4

## 2023-05-30 NOTE — H&P (Signed)
 History and Physical   Justin Reid WUJ:[id number] DOB: 1980/07/07 DOA: 05/30/2023  PCP: Vicente Graham, No  Outpatient Specialists: Dr. Aleskerov, [facility] pulmonology Patient coming from: home via pOV  I have personally briefly reviewed patient's old medical records in [facility] EMR.  Chief Concern: Shortness of breath, chest discomfort  HPI: [name] is a 43 year old male with history of morbid obesity, severe asthma with frequent exacerbation, OSA, hypertension, GERD, who presents emergency department for chief concerns of chest pain and shortness of breath.  Vitals in the ED showed temperature of 98.1, respiration rate 26, heart rate 94, blood pressure 128/99, SpO2 of 96% on room air.  Per EDP, patient desatted to 88% on room air and was placed on 2 L nasal cannula with appropriate improvement.  Serum sodium is 140, potassium 3.8, chloride 105, bicarb 27, BUN of 20, serum creatinine 0.86, EGFR greater than 60, nonfasting blood glucose 111, WBC 9.3, hemoglobin 12.3, platelets of 337.  HS troponin is 5.  COVID/influenza A/influenza B/RSV PCR were negative.  ED treatment: Albuterol  nebulizer one-time treatment, DuoNebs one-time treatment high-dose, Solu-Medrol  125 mg IV one-time dose, magnesium  2 g IV one-time dose. ------------------------------------------- At bedside, patient was able to tell me his first and last name, age, location, current calendar year.  Reports he developed shortness of breath about 2 days ago.  He reports the cough is dry and nonproductive, with wheezing.  He endorses compliance with his inhalers and breathing treatments.  He denies known sick contacts.  He denies fever, abdominal pain, dysuria, hematuria, diarrhea, blood in his stool, syncope, loss of consciousness, swelling of his lower extremities.  He endorses chest discomfort, tenderness, worse with coughing.  He reports there is no chest pain at this time.  He reports he did currently does  not have a CPAP machine prescribed for home.  He reports that he has a sleep study scheduled for late July per his pulmonologist.  Social history: He lives at home with his girlfriend.  He is a former tobacco user, quitting about 1 month ago.  He denies EtOH and recreational drug use.  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  ROS: Constitutional: no weight change, no fever ENT/Mouth: no sore throat, no rhinorrhea Eyes: no eye pain, no vision changes Cardiovascular: no chest pain, + dyspnea,  no edema, no palpitations Respiratory: no cough, no sputum, + wheezing Gastrointestinal: no nausea, no vomiting, no diarrhea, no constipation Genitourinary: no urinary incontinence, no dysuria, no hematuria Musculoskeletal: no arthralgias, no myalgias Skin: no skin lesions, no pruritus, Neuro: no weakness, no loss of consciousness, no syncope Psych: no anxiety, no depression, no decrease appetite Heme/Lymph: no bruising, no bleeding  ED Course: Discussed with EDP, patient requiring hospitalization for chief concerns of asthma exacerbation.  Assessment/Plan  Principal Problem:   Acute severe exacerbation of severe persistent asthma Active Problems:   Obesity hypoventilation syndrome (HCC)   Tobacco use disorder   OSA (obstructive sleep apnea)   Acute respiratory failure with hypoxia (HCC)   HTN (hypertension)   Obesity, Class III, BMI 40-49.9 (morbid obesity)   GERD (gastroesophageal reflux disease)   Non-cardiac chest pain   Truncal obesity   Cough   Assessment and Plan:  * Acute severe exacerbation of severe persistent asthma DuoNebs 4 times daily, 3 doses; Solu-Medrol  40 mg IV one-time dose ordered for 05/31/2023, 1 dose, ordered on admission Status post 1 high dose DuoNeb treatment and 1 albuterol  treatment per EDP with magnesium  sulfate 2 g IV one-time  dose and Solu-Medrol  125 mg IV one-time dose per EDP  Maintain SpO2 > 92% Admit to telemetry medical, observation  Tobacco use  disorder Former tobacco user, quitting over 1 month ago As needed nicotine  patch ordered on admission for nicotine  craving  OSA (obstructive sleep apnea) CPAP nightly ordered  Non-cardiac chest pain Musculoskeletal chest pain secondary to coughing As needed lidocaine  patch ordered for pain in chest area  Obesity, Class III, BMI 40-49.9 (morbid obesity) This complicates overall care and prognosis.   Cough Suspect bronchospasm in setting of asthma As needed guaifenesin  5 mL p.o. every 6 hours.  To loosen phlegm and cough during the day; Tussionex 5 mL p.o. nightly as needed for cough, 2 days ordered  Chart reviewed.   DVT prophylaxis: Enoxaparin  Code Status: Full code Diet: Heart healthy Family Communication: No, he states he will update his girlfriend Disposition Plan: Pending clinical course Consults called: TOC for oxygen  supplementation Admission status: Telemetry medical, observation  Past Medical History:  Diagnosis Date   2019-nCoV vaccination declined 11/10/2020   Arthritis    Asthma    Hypertension    Morbid obesity (HCC)    Shingles    Tobacco use    History reviewed. No pertinent surgical history.  Social History:  reports that he has quit smoking. His smoking use included cigarettes. He has a 2.5 pack-year smoking history. He has never used smokeless tobacco. He reports that he does not currently use alcohol. He reports that he does not use drugs.  No Known Allergies Family History  Problem Relation Age of Onset   Hypertension Father    Hypertension Maternal Grandmother    Family history: Family history reviewed and not pertinent.  Prior to Admission medications   Medication Sig Start Date End Date Taking? Authorizing Provider  acetaminophen  (TYLENOL ) 325 MG tablet Take 2 tablets (650 mg total) by mouth every 6 (six) hours as needed for mild pain (pain score 1-3) or fever. 04/06/23   Alexander, Natalie, DO  albuterol  (VENTOLIN  HFA) 108 (90 Base) MCG/ACT  inhaler Inhale 2 puffs into the lungs every 4 (four) hours as needed for wheezing or shortness of breath. 05/16/23   Brenna Cam, MD  amLODipine  (NORVASC ) 5 MG tablet Take 1 tablet (5 mg total) by mouth daily. 04/22/23 06/21/23  Tiajuana Fluke, MD  fluticasone -salmeterol (WIXELA INHUB ) 250-50 MCG/ACT AEPB Inhale 1 puff into the lungs in the morning and at bedtime. 05/16/23 07/15/23  Brenna Cam, MD  furosemide  (LASIX ) 40 MG tablet Take 1 tablet (40 mg total) by mouth daily. 05/16/23 06/15/23  Brenna Cam, MD  ipratropium-albuterol  (DUONEB) 0.5-2.5 (3) MG/3ML SOLN Take 3 mLs by nebulization every 6 (six) hours as needed. 05/16/23 06/15/23  Brenna Cam, MD  loratadine  (CLARITIN ) 10 MG tablet Take 1 tablet (10 mg total) by mouth daily. 04/06/23   Alexander, Natalie, DO  metoprolol  succinate (TOPROL -XL) 50 MG 24 hr tablet Take 1 tablet (50 mg total) by mouth daily. Take with or immediately following a meal. 04/22/23 06/21/23  Margery Sheets B, MD  montelukast  (SINGULAIR ) 10 MG tablet Take 1 tablet (10 mg total) by mouth daily. 05/16/23 06/15/23  Brenna Cam, MD  nicotine  (NICODERM CQ  - DOSED IN MG/24 HOURS) 14 mg/24hr patch Place 1 patch (14 mg total) onto the skin daily as needed for up to 28 days (nicotine  craving). 05/16/23 06/13/23  Brenna Cam, MD  pantoprazole  (PROTONIX ) 40 MG tablet Take 1 tablet (40 mg total) by mouth daily. 05/16/23 07/15/23  Brenna Cam, MD  Physical Exam: Vitals:   05/30/23 1054 05/30/23 1055 05/30/23 1200 05/30/23 1245  BP:  (!) 128/99 (!) 135/90   Pulse:  94  82  Resp:  (!) 26  15  Temp:  98.1 F (36.7 C)    TempSrc:  Oral    SpO2:  96%  93%  Weight: (!) 145.2 kg     Height: 5\' 10"  (1.778 m)      Constitutional: appears older than chronological age, NAD, calm Eyes: PERRL, lids and conjunctivae normal ENMT: Mucous membranes are moist. Posterior pharynx clear of any exudate or lesions. Age-appropriate dentition. Hearing appropriate Neck: normal, supple, no masses, no  thyromegaly Respiratory: + Bilateral end expiratory wheezing, no crackles.  Mild increased respiratory effort.  Mild accessory muscle use.  Cardiovascular: Regular rate and rhythm, no murmurs / rubs / gallops. No extremity edema. 2+ pedal pulses. No carotid bruits.  Abdomen: Morbidly obese abdomen, no tenderness, no masses palpated, no hepatosplenomegaly. Bowel sounds positive.  Musculoskeletal: no clubbing / cyanosis. No joint deformity upper and lower extremities. Good ROM, no contractures, no atrophy. Normal muscle tone.  Skin: no rashes, lesions, ulcers. No induration Neurologic: Sensation intact. Strength 5/5 in all 4.  Psychiatric: Normal judgment and insight. Alert and oriented x 3. Normal mood.   EKG: independently reviewed, showing sinus rhythm with rate of 91, QTc 423  Chest x-ray on Admission: I personally reviewed and I agree with radiologist reading as below.  DG Chest Port 1 View Result Date: 05/30/2023 CLINICAL DATA:  Chest pain and shortness of breath. EXAM: PORTABLE CHEST 1 VIEW COMPARISON:  05/14/2023 FINDINGS: The heart size and mediastinal contours are within normal limits. Both lungs are clear. The visualized skeletal structures are unremarkable. IMPRESSION: No active disease. Electronically Signed   By: Donnal Fusi M.D.   On: 05/30/2023 11:27   Labs on Admission: I have personally reviewed following labs  CBC: Recent Labs  Lab 05/30/23 1103  WBC 9.3  HGB 12.3*  HCT 39.0  MCV 88.2  PLT 337   Basic Metabolic Panel: Recent Labs  Lab 05/30/23 1103  NA 140  K 3.8  CL 105  CO2 27  GLUCOSE 111*  BUN 20  CREATININE 0.86  CALCIUM 8.8*   GFR: Estimated Creatinine Clearance: 162.9 mL/min (by C-G formula based on SCr of 0.86 mg/dL).  This document was prepared using Dragon Voice Recognition software and may include unintentional dictation errors.  Dr. Reinhold Carbine [facility]  If 7PM-7AM, please contact overnight-coverage provider If 7AM-7PM, please contact  day attending provider [email or web address]  05/30/2023, 1:18 PM

## 2023-05-30 NOTE — Assessment & Plan Note (Addendum)
 Former tobacco user, quitting over 1 month ago As needed nicotine  patch ordered on admission for nicotine  craving

## 2023-05-30 NOTE — Progress Notes (Signed)
 Patient placed on AUTO CPAP with 2L of oxygen  bled in.  Patient tolerating well at this time.

## 2023-05-30 NOTE — ED Provider Notes (Signed)
 [facility] Provider Note    Event Date/Time   First MD Initiated Contact with Patient 05/30/23 1053     (approximate)   History   Chief Complaint Shortness of Breath and Chest Pain   HPI  Justin Reid is a 43 y.o. male with past medical history of hypertension, asthma, and OSA who presents to the ED complaining of shortness of breath.  Patient reports that he has been increasingly short of breath with a dry cough and some sharp pain in the center of his chest over the past 2 days.  He denies any fevers and has not had any pain or swelling in his legs.  He has been using his nebulizer at home without significant relief, attempted to go to work this morning but became too short of breath.     Physical Exam   Triage Vital Signs: ED Triage Vitals  Encounter Vitals Group     BP 05/30/23 1055 (!) 128/99     Systolic BP Percentile --      Diastolic BP Percentile --      Pulse Rate 05/30/23 1055 94     Resp 05/30/23 1055 (!) 26     Temp 05/30/23 1055 98.1 F (36.7 C)     Temp Source 05/30/23 1055 Oral     SpO2 05/30/23 1055 96 %     Weight 05/30/23 1054 (!) 320 lb (145.2 kg)     Height 05/30/23 1054 5\' 10"  (1.778 m)     Head Circumference --      Peak Flow --      Pain Score 05/30/23 1054 5     Pain Loc --      Pain Education --      Exclude from Growth Chart --     Most recent vital signs: Vitals:   05/30/23 1055  BP: (!) 128/99  Pulse: 94  Resp: (!) 26  Temp: 98.1 F (36.7 C)  SpO2: 96%    Constitutional: Alert and oriented. Eyes: Conjunctivae are normal. Head: Atraumatic. Nose: No congestion/rhinnorhea. Mouth/Throat: Mucous membranes are moist.  Cardiovascular: Normal rate, regular rhythm. Grossly normal heart sounds.  2+ radial pulses bilaterally. Respiratory: Mildly tachypneic with normal respiratory effort.  No retractions. Lungs with expiratory wheezing throughout. Gastrointestinal: Soft and nontender. No  distention. Musculoskeletal: No lower extremity tenderness nor edema.  Neurologic:  Normal speech and language. No gross focal neurologic deficits are appreciated.    ED Results / Procedures / Treatments   Labs (all labs ordered are listed, but only abnormal results are displayed) Labs Reviewed  BASIC METABOLIC PANEL WITH GFR - Abnormal; Notable for the following components:      Result Value   Glucose, Bld 111 (*)    Calcium 8.8 (*)    All other components within normal limits  CBC - Abnormal; Notable for the following components:   Hemoglobin 12.3 (*)    All other components within normal limits  RESP PANEL BY RT-PCR (RSV, FLU A&B, COVID)  [id number]  TROPONIN I (HIGH SENSITIVITY)  TROPONIN I (HIGH SENSITIVITY)     EKG  ED ECG REPORT I, Twilla Galea, the attending physician, personally viewed and interpreted this ECG.   Date: 05/30/2023  EKG Time: 10:51  Rate: 91  Rhythm: normal sinus rhythm  Axis: Normal  Intervals:none  ST&T Change: None  RADIOLOGY Chest x-ray reviewed and interpreted by me with no infiltrate, edema, or effusion.  PROCEDURES:  Critical Care performed: Yes, see critical care  procedure note(s)  .Critical Care  Performed by: Twilla Galea, MD Authorized by: Twilla Galea, MD   Critical care provider statement:    Critical care time (minutes):  30   Critical care time was exclusive of:  Separately billable procedures and treating other patients and teaching time   Critical care was necessary to treat or prevent imminent or life-threatening deterioration of the following conditions:  Respiratory failure   Critical care was time spent personally by me on the following activities:  Development of treatment plan with patient or surrogate, discussions with consultants, evaluation of patient's response to treatment, examination of patient, ordering and review of laboratory studies, ordering and review of radiographic studies, ordering and performing  treatments and interventions, pulse oximetry, re-evaluation of patient's condition and review of old charts   I assumed direction of critical care for this patient from another provider in my specialty: no     Care discussed with: admitting provider      MEDICATIONS ORDERED IN ED: Medications  magnesium  sulfate IVPB 2 g 50 mL (has no administration in time range)  ipratropium-albuterol  (DUONEB) 0.5-2.5 (3) MG/3ML nebulizer solution 9 mL (9 mLs Nebulization Given 05/30/23 1112)  methylPREDNISolone  sodium succinate (SOLU-MEDROL ) 125 mg/2 mL injection 125 mg (125 mg Intravenous Given 05/30/23 1112)  albuterol  (PROVENTIL ) (2.5 MG/3ML) 0.083% nebulizer solution 2.5 mg (2.5 mg Nebulization Given 05/30/23 1210)     IMPRESSION / MDM / ASSESSMENT AND PLAN / ED COURSE  I reviewed the triage vital signs and the nursing notes.                              43 y.o. male with past medical history of hypertension, asthma, and OSA who presents to the ED complaining of increasing difficulty breathing over the past 2 days with dry cough and sharp pain in his chest.  Patient's presentation is most consistent with acute presentation with potential threat to life or bodily function.  Differential diagnosis includes, but is not limited to, ACS, PE, pneumonia, asthma exacerbation, CHF, bronchitis, COVID-19, influenza.  Patient nontoxic-appearing and in no acute distress, vital signs remarkable for mild tachypnea but otherwise reassuring and patient maintaining oxygen  saturations on room air.  EKG shows no evidence of arrhythmia or ischemia, he does have wheezing on exam and we will treat with IV Solu-Medrol  as well as DuoNebs.  Chest x-ray and labs are pending at this time.  Chest x-ray is unremarkable, troponin within normal limits, doubt ACS or PE at this time.  Labs also reassuring with no significant anemia, leukocytosis, electrolyte abnormality, or AKI.  He does continue to have significant wheezing on  reassessment, oxygen  saturations noted to be in the high 80s on room air, are improved on 2 L..  We will give additional albuterol  and IV magnesium , case discussed with hospitalist for admission.      FINAL CLINICAL IMPRESSION(S) / ED DIAGNOSES   Final diagnoses:  Severe persistent asthma with exacerbation     Rx / DC Orders   ED Discharge Orders     None        Note:  This document was prepared using Dragon voice recognition software and may include unintentional dictation errors.   Twilla Galea, MD 05/30/23 1239

## 2023-05-30 NOTE — Hospital Course (Signed)
 [name] is a 43 year old male with history of morbid obesity, severe asthma with frequent exacerbation, OSA, hypertension, GERD, who presents emergency department for chief concerns of chest pain and shortness of breath.  Vitals in the ED showed temperature of 98.1, respiration rate 26, heart rate 94, blood pressure 128/99, SpO2 of 96% on room air.  Per EDP, patient desatted to 88% on room air and was placed on 2 L nasal cannula with appropriate improvement.  Serum sodium is 140, potassium 3.8, chloride 105, bicarb 27, BUN of 20, serum creatinine 0.86, EGFR greater than 60, nonfasting blood glucose 111, WBC 9.3, hemoglobin 12.3, platelets of 337.  HS troponin is 5.  COVID/influenza A/influenza B/RSV PCR were negative.  ED treatment: Albuterol  nebulizer one-time treatment, DuoNebs one-time treatment high-dose, Solu-Medrol  125 mg IV one-time dose, magnesium  2 g IV one-time dose.  5/27.  Patient feeling much better in the afternoon and wanted to go home.  Spoke with the pharmacy and he received scripts within the month.  I okayed an extra albuterol  inhaler.  Prescribed prednisone .  Patient also asked for pain medication.  Patient's other medications cannot be refilled at this point but he does have refills at the pharmacy.

## 2023-05-30 NOTE — Assessment & Plan Note (Signed)
 This complicates overall care and prognosis.

## 2023-05-30 NOTE — Progress Notes (Signed)
 Patient known to nurse navigator from previous admissions. Patient with hx of noncompliance.  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 Patient DOES report he is now employed at [employer].   Patient states he has been "doing well" since his last admission (discharged 14 days ago).   When asked about his missed PCP establishment appointment, patient states "Oh I go to [facility] for that now."   Patient made aware that navigator will follow up with he is admitted or in a couple days if discharged from ER.

## 2023-05-30 NOTE — Assessment & Plan Note (Signed)
 Musculoskeletal chest pain secondary to coughing As needed lidocaine  patch ordered for pain in chest area

## 2023-05-30 NOTE — Assessment & Plan Note (Signed)
 Suspect bronchospasm in setting of asthma As needed guaifenesin  5 mL p.o. every 6 hours.  To loosen phlegm and cough during the day; Tussionex 5 mL p.o. nightly as needed for cough, 2 days ordered

## 2023-05-30 NOTE — Progress Notes (Signed)
 PHARMACIST - PHYSICIAN COMMUNICATION  CONCERNING:  Enoxaparin  (Lovenox ) for DVT Prophylaxis    RECOMMENDATION: Patient was prescribed enoxaprin 40mg  q24 hours for VTE prophylaxis.   Filed Weights   05/30/23 1054  Weight: (!) 145.2 kg (320 lb)    Body mass index is 45.92 kg/m.  Estimated Creatinine Clearance: 162.9 mL/min (by C-G formula based on SCr of 0.86 mg/dL).   Based on [facility] policy patient is candidate for enoxaparin  0.5mg /kg TBW SQ every 24 hours based on BMI being >30.   DESCRIPTION: Pharmacy has adjusted enoxaparin  dose per [facility] policy.  Patient is now receiving enoxaparin  72.5 mg every 24 hours    Alice Innocent, PharmD Clinical Pharmacist  05/30/2023 12:44 PM

## 2023-05-31 ENCOUNTER — Other Ambulatory Visit: Payer: Self-pay

## 2023-05-31 DIAGNOSIS — F17200 Nicotine dependence, unspecified, uncomplicated: Secondary | ICD-10-CM

## 2023-05-31 DIAGNOSIS — K2100 Gastro-esophageal reflux disease with esophagitis, without bleeding: Secondary | ICD-10-CM

## 2023-05-31 DIAGNOSIS — E662 Morbid (severe) obesity with alveolar hypoventilation: Secondary | ICD-10-CM

## 2023-05-31 DIAGNOSIS — I10 Essential (primary) hypertension: Secondary | ICD-10-CM

## 2023-05-31 DIAGNOSIS — E66813 Obesity, class 3: Secondary | ICD-10-CM

## 2023-05-31 DIAGNOSIS — G8929 Other chronic pain: Secondary | ICD-10-CM | POA: Insufficient documentation

## 2023-05-31 DIAGNOSIS — R0789 Other chest pain: Secondary | ICD-10-CM

## 2023-05-31 DIAGNOSIS — G4733 Obstructive sleep apnea (adult) (pediatric): Secondary | ICD-10-CM

## 2023-05-31 LAB — BASIC METABOLIC PANEL WITH GFR
Anion gap: 11 (ref 5–15)
BUN: 18 mg/dL (ref 6–20)
CO2: 24 mmol/L (ref 22–32)
Calcium: 9.3 mg/dL (ref 8.9–10.3)
Chloride: 104 mmol/L (ref 98–111)
Creatinine, Ser: 0.71 mg/dL (ref 0.61–1.24)
GFR, Estimated: >60 mL/min (ref >60)
Glucose, Bld: 151 mg/dL — ABNORMAL HIGH (ref 70–99)
Potassium: 4.1 mmol/L (ref 3.5–5.1)
Sodium: 139 mmol/L (ref 135–145)

## 2023-05-31 LAB — CBC
HCT: 36.3 % — ABNORMAL LOW (ref 39.0–52.0)
Hemoglobin: 11.7 g/dL — ABNORMAL LOW (ref 13.0–17.0)
MCH: 27.8 pg (ref 26.0–34.0)
MCHC: 32.2 g/dL (ref 30.0–36.0)
MCV: 86.2 fL (ref 80.0–100.0)
Platelets: 363 10*3/uL (ref 150–400)
RBC: 4.21 MIL/uL — ABNORMAL LOW (ref 4.22–5.81)
RDW: 14.6 % (ref 11.5–15.5)
WBC: 11.5 10*3/uL — ABNORMAL HIGH (ref 4.0–10.5)
nRBC: 0 % (ref 0.0–0.2)

## 2023-05-31 LAB — MAGNESIUM: Magnesium: 2.6 mg/dL — ABNORMAL HIGH (ref 1.7–2.4)

## 2023-05-31 MED ORDER — ACETAMINOPHEN 650 MG RE SUPP
650.0000 mg | Freq: Four times a day (QID) | RECTAL | Status: DC | PRN
Start: 2023-05-31 — End: 2023-05-31

## 2023-05-31 MED ORDER — IPRATROPIUM-ALBUTEROL 0.5-2.5 (3) MG/3ML IN SOLN
3.0000 mL | Freq: Four times a day (QID) | RESPIRATORY_TRACT | Status: DC
  Administered 2023-05-31: 3 mL via RESPIRATORY_TRACT
  Filled 2023-05-31: qty 3

## 2023-05-31 MED ORDER — PREDNISONE 20 MG PO TABS
20.0000 mg | ORAL_TABLET | Freq: Every day | ORAL | 0 refills | Status: AC
  Filled 2023-05-31: qty 3, 3d supply, fill #0

## 2023-05-31 MED ORDER — CALCIUM CARBONATE ANTACID 500 MG PO CHEW
800.0000 mg | CHEWABLE_TABLET | Freq: Three times a day (TID) | ORAL | Status: DC | PRN
  Administered 2023-05-31: 800 mg via ORAL
  Filled 2023-05-31: qty 4

## 2023-05-31 MED ORDER — ALBUTEROL SULFATE HFA 108 (90 BASE) MCG/ACT IN AERS
2.0000 | INHALATION_SPRAY | RESPIRATORY_TRACT | 0 refills | Status: DC | PRN
  Filled 2023-05-31: qty 6.7, 16d supply, fill #0

## 2023-05-31 MED ORDER — ALBUTEROL SULFATE (2.5 MG/3ML) 0.083% IN NEBU
2.5000 mg | INHALATION_SOLUTION | RESPIRATORY_TRACT | Status: DC | PRN
Start: 2023-05-31 — End: 2023-05-31
  Administered 2023-05-31: 2.5 mg via RESPIRATORY_TRACT
  Filled 2023-05-31: qty 3

## 2023-05-31 MED ORDER — OXYCODONE HCL 5 MG PO TABS
5.0000 mg | ORAL_TABLET | Freq: Four times a day (QID) | ORAL | 0 refills | Status: DC | PRN
  Filled 2023-05-31: qty 10, 3d supply, fill #0

## 2023-05-31 MED ORDER — ACETAMINOPHEN 325 MG PO TABS: 650.0000 mg | ORAL_TABLET | Freq: Four times a day (QID) | ORAL | Status: DC | PRN

## 2023-05-31 NOTE — Discharge Summary (Addendum)
 Physician Discharge Summary   Patient: Justin Reid MRN: [id number] DOB: [birth year]  Admit date:     05/30/2023  Discharge date: 05/31/23  Discharge Physician: Verla Glaze   PCP: Pcp, No   Recommendations at discharge:   Follow-up with the primary care physician that you set up with.  Discharge Diagnoses: Principal Problem:   Acute severe exacerbation of severe persistent asthma Active Problems:   Obesity hypoventilation syndrome (HCC)   OSA (obstructive sleep apnea)   Acute respiratory failure with hypoxia (HCC)   HTN (hypertension)   Obesity, Class III, BMI 40-49.9 (morbid obesity)   GERD (gastroesophageal reflux disease)   Non-cardiac chest pain   Tobacco use disorder   Truncal obesity   Cough   Chronic pain   Hospital Course: [name] is a 43 year old male with history of morbid obesity, severe asthma with frequent exacerbation, OSA, hypertension, GERD, who presents emergency department for chief concerns of chest pain and shortness of breath.  Vitals in the ED showed temperature of 98.1, respiration rate 26, heart rate 94, blood pressure 128/99, SpO2 of 96% on room air.  Per EDP, patient desatted to 88% on room air and was placed on 2 L nasal cannula with appropriate improvement.  Serum sodium is 140, potassium 3.8, chloride 105, bicarb 27, BUN of 20, serum creatinine 0.86, EGFR greater than 60, nonfasting blood glucose 111, WBC 9.3, hemoglobin 12.3, platelets of 337.  HS troponin is 5.  COVID/influenza A/influenza B/RSV PCR were negative.  ED treatment: Albuterol  nebulizer one-time treatment, DuoNebs one-time treatment high-dose, Solu-Medrol  125 mg IV one-time dose, magnesium  2 g IV one-time dose.  5/27.  Patient feeling much better in the afternoon and wanted to go home.  Spoke with the pharmacy and he received scripts within the month.  I okayed an extra albuterol  inhaler.  Prescribed prednisone .  Patient also asked for pain medication.  Patient's  other medications cannot be refilled at this point but he does have refills at the pharmacy.  Assessment and Plan: * Acute severe exacerbation of severe persistent asthma Patient received Solu-Medrol  nebulizer treatments and magnesium .  Will continue prednisone  upon going home.  Patient improved faster than initially anticipated.  Lungs are clear in the afternoon on 5/27.  Pulse ox 94% on room air with walking.  OSA (obstructive sleep apnea) CPAP nightly ordered  Obesity hypoventilation syndrome (HCC) With obstructive sleep apnea.  CPAP at night  Non-cardiac chest pain Musculoskeletal chest pain secondary to coughing   GERD (gastroesophageal reflux disease) On protonix   Obesity, Class III, BMI 40-49.9 (morbid obesity) This complicates overall care and prognosis.  BMI 45.92  HTN (hypertension) Continue metoprolol  Lasix   Tobacco use disorder Former tobacco user, quitting over 1 month ago   Chronic pain Solu-Medrol  may help.  I did prescribe a few pain pills upon going home.  He will have to follow-up with a pain specialist or medical doc in the future.         Consultants: None Procedures performed: None Disposition: Home Diet recommendation:  Cardiac diet DISCHARGE MEDICATION: Allergies as of 05/31/2023   No Known Allergies      Medication List     STOP taking these medications    amLODipine  5 MG tablet Commonly known as: NORVASC        TAKE these medications    acetaminophen  325 MG tablet Commonly known as: TYLENOL  Take 2 tablets (650 mg total) by mouth every 6 (six) hours as needed for mild pain (pain score 1-3) or fever.  albuterol  108 (90 Base) MCG/ACT inhaler Commonly known as: VENTOLIN  HFA Inhale 2 puffs into the lungs every 4 (four) hours as needed for wheezing or shortness of breath.   furosemide  40 MG tablet Commonly known as: LASIX  Take 1 tablet (40 mg total) by mouth daily.   ipratropium-albuterol  0.5-2.5 (3) MG/3ML Soln Commonly  known as: DUONEB Take 3 mLs by nebulization every 6 (six) hours as needed.   loratadine  10 MG tablet Commonly known as: CLARITIN  Take 1 tablet (10 mg total) by mouth daily.   metoprolol  succinate 50 MG 24 hr tablet Commonly known as: TOPROL -XL Take 1 tablet (50 mg total) by mouth daily. Take with or immediately following a meal.   montelukast  10 MG tablet Commonly known as: SINGULAIR  Take 1 tablet (10 mg total) by mouth daily.   nicotine  14 mg/24hr patch Commonly known as: NICODERM CQ  - dosed in mg/24 hours Place 1 patch (14 mg total) onto the skin daily as needed for up to 28 days (nicotine  craving).   oxyCODONE  5 MG immediate release tablet Commonly known as: Roxicodone  Take 1 tablet (5 mg total) by mouth every 6 (six) hours as needed for severe pain (pain score 7-10).   pantoprazole  40 MG tablet Commonly known as: PROTONIX  Take 1 tablet (40 mg total) by mouth daily.   predniSONE  20 MG tablet Commonly known as: DELTASONE  Take 1 tablet (20 mg total) by mouth daily for 3 days. Start taking on: Jun 01, 2023   Wixela Inhub  250-50 MCG/ACT Aepb Generic drug: fluticasone -salmeterol Inhale 1 puff into the lungs in the morning and at bedtime.        Discharge Exam: Filed Weights   05/30/23 1054  Weight: (!) 145.2 kg   Physical Exam HENT:     Head: Normocephalic.     Mouth/Throat:     Pharynx: No oropharyngeal exudate.  Eyes:     General: Lids are normal.     Conjunctiva/sclera: Conjunctivae normal.  Cardiovascular:     Rate and Rhythm: Normal rate and regular rhythm.     Heart sounds: Normal heart sounds, S1 normal and S2 normal.  Pulmonary:     Breath sounds: Examination of the right-lower field reveals decreased breath sounds. Examination of the left-lower field reveals decreased breath sounds. Decreased breath sounds present. No wheezing, rhonchi or rales.  Abdominal:     Palpations: Abdomen is soft.     Tenderness: There is no abdominal tenderness.   Musculoskeletal:     Right lower leg: No swelling.     Left lower leg: No swelling.  Skin:    General: Skin is warm.     Findings: No rash.  Neurological:     Mental Status: He is alert and oriented to person, place, and time.      Condition at discharge: stable  The results of significant diagnostics from this hospitalization (including imaging, microbiology, ancillary and laboratory) are listed below for reference.   Imaging Studies: DG Chest Port 1 View Result Date: 05/30/2023 CLINICAL DATA:  Chest pain and shortness of breath. EXAM: PORTABLE CHEST 1 VIEW COMPARISON:  05/14/2023 FINDINGS: The heart size and mediastinal contours are within normal limits. Both lungs are clear. The visualized skeletal structures are unremarkable. IMPRESSION: No active disease. Electronically Signed   By: Donnal Fusi M.D.   On: 05/30/2023 11:27   DG Chest Portable 1 View Result Date: 05/15/2023 CLINICAL DATA:  SOB EXAM: PORTABLE CHEST 1 VIEW COMPARISON:  Chest x-ray 05/12/2023, CT abdomen pelvis 04/26/2023 FINDINGS: The  heart and mediastinal contours are unchanged. Left base atelectasis. No focal consolidation. No pulmonary edema. No pleural effusion. No pneumothorax. No acute osseous abnormality. IMPRESSION: No active disease. Electronically Signed   By: Morgane  Naveau M.D.   On: 05/15/2023 00:39   US  Venous Img Lower Bilateral Result Date: 05/12/2023 CLINICAL DATA:  Left leg swelling EXAM: LEFT LOWER EXTREMITY VENOUS DOPPLER ULTRASOUND TECHNIQUE: Gray-scale sonography with compression, as well as color and duplex ultrasound, were performed to evaluate the deep venous system(s) from the level of the common femoral vein through the popliteal and proximal calf veins. COMPARISON:  None Available. FINDINGS: VENOUS Normal compressibility of the common femoral, superficial femoral, and popliteal veins, as well as the visualized calf veins. Visualized portions of profunda femoral vein and great saphenous vein  unremarkable. No filling defects to suggest DVT on grayscale or color Doppler imaging. Doppler waveforms show normal direction of venous flow, normal respiratory plasticity and response to augmentation. Limited views of the contralateral common femoral vein are unremarkable. OTHER Probable moderate left knee joint effusion. Limitations: Peroneal vein in the left calf not visualized. IMPRESSION: No evidence of left lower extremity DVT Left knee joint effusion. Electronically Signed   By: Janeece Mechanic M.D.   On: 05/12/2023 18:49   DG Chest 2 View Result Date: 05/12/2023 CLINICAL DATA:  Shortness of breath. EXAM: CHEST - 2 VIEW COMPARISON:  April 25, 2023. FINDINGS: The heart size and mediastinal contours are within normal limits. Both lungs are clear. The visualized skeletal structures are unremarkable. IMPRESSION: No active cardiopulmonary disease. Electronically Signed   By: Rosalene Colon M.D.   On: 05/12/2023 15:28    Microbiology: Results for orders placed or performed during the hospital encounter of 05/30/23  Resp panel by RT-PCR (RSV, Flu A&B, Covid) Anterior Nasal Swab     Status: None   Collection Time: 05/30/23 11:16 AM   Specimen: Anterior Nasal Swab  Result Value Ref Range Status   SARS Coronavirus 2 by RT PCR NEGATIVE NEGATIVE Final    Comment: (NOTE) SARS-CoV-2 target nucleic acids are NOT DETECTED.  The SARS-CoV-2 RNA is generally detectable in upper respiratory specimens during the acute phase of infection. The lowest concentration of SARS-CoV-2 viral copies this assay can detect is 138 copies/mL. A negative result does not preclude SARS-Cov-2 infection and should not be used as the sole basis for treatment or other patient management decisions. A negative result may occur with  improper specimen collection/handling, submission of specimen other than nasopharyngeal swab, presence of viral mutation(s) within the areas targeted by this assay, and inadequate number of  viral copies(<138 copies/mL). A negative result must be combined with clinical observations, patient history, and epidemiological information. The expected result is Negative.  Fact Sheet for Patients:  BloggerCourse.com  Fact Sheet for Healthcare Providers:  SeriousBroker.it  This test is no t yet approved or cleared by the [place]  FDA and  has been authorized for detection and/or diagnosis of SARS-CoV-2 by FDA under an Emergency Use Authorization (EUA). This EUA will remain  in effect (meaning this test can be used) for the duration of the COVID-19 declaration under Section 564(b)(1) of the Act, 21 U.S.C.section 360bbb-3(b)(1), unless the authorization is terminated  or revoked sooner.       Influenza A by PCR NEGATIVE NEGATIVE Final   Influenza B by PCR NEGATIVE NEGATIVE Final    Comment: (NOTE) The Xpert Xpress SARS-CoV-2/FLU/RSV plus assay is intended as an aid in the diagnosis of influenza from Nasopharyngeal swab  specimens and should not be used as a sole basis for treatment. Nasal washings and aspirates are unacceptable for Xpert Xpress SARS-CoV-2/FLU/RSV testing.  Fact Sheet for Patients: BloggerCourse.com  Fact Sheet for Healthcare Providers: SeriousBroker.it  This test is not yet approved or cleared by the [place]  FDA and has been authorized for detection and/or diagnosis of SARS-CoV-2 by FDA under an Emergency Use Authorization (EUA). This EUA will remain in effect (meaning this test can be used) for the duration of the COVID-19 declaration under Section 564(b)(1) of the Act, 21 U.S.C. section 360bbb-3(b)(1), unless the authorization is terminated or revoked.     Resp Syncytial Virus by PCR NEGATIVE NEGATIVE Final    Comment: (NOTE) Fact Sheet for Patients: BloggerCourse.com  Fact Sheet for Healthcare  Providers: SeriousBroker.it  This test is not yet approved or cleared by the [place]  FDA and has been authorized for detection and/or diagnosis of SARS-CoV-2 by FDA under an Emergency Use Authorization (EUA). This EUA will remain in effect (meaning this test can be used) for the duration of the COVID-19 declaration under Section 564(b)(1) of the Act, 21 U.S.C. section 360bbb-3(b)(1), unless the authorization is terminated or revoked.  Performed at [facility], [address] [place], [place] [address]     Labs: CBC: Recent Labs  Lab 05/30/23 1103 05/31/23 0427  WBC 9.3 11.5*  HGB 12.3* 11.7*  HCT 39.0 36.3*  MCV 88.2 86.2  PLT 337 363   Basic Metabolic Panel: Recent Labs  Lab 05/30/23 1103 05/31/23 0427  NA 140 139  K 3.8 4.1  CL 105 104  CO2 27 24  GLUCOSE 111* 151*  BUN 20 18  CREATININE 0.86 0.71  CALCIUM 8.8* 9.3  MG  --  2.6*   Liver Function Tests: No results for input(s): "AST", "ALT", "ALKPHOS", "BILITOT", "PROT", "ALBUMIN" in the last 168 hours. CBG: No results for input(s): "GLUCAP" in the last 168 hours.  Discharge time spent: greater than 30 minutes.  Signed: Verla Glaze, MD [facility] 05/31/2023

## 2023-05-31 NOTE — Assessment & Plan Note (Signed)
 Continue metoprolol  Lasix 

## 2023-05-31 NOTE — Plan of Care (Signed)
  Problem: Health Behavior/Discharge Planning: Goal: Ability to manage health-related needs will improve Outcome: Progressing   Problem: Education: Goal: Knowledge of General Education information will improve Description: Including pain rating scale, medication(s)/side effects and non-pharmacologic comfort measures Outcome: Progressing   Problem: Clinical Measurements: Goal: Diagnostic test results will improve Outcome: Progressing   

## 2023-05-31 NOTE — Assessment & Plan Note (Signed)
 Solu-Medrol  may help.  I did prescribe a few pain pills upon going home.  He will have to follow-up with a pain specialist or medical doc in the future.

## 2023-05-31 NOTE — Assessment & Plan Note (Signed)
On protonix

## 2023-05-31 NOTE — Assessment & Plan Note (Signed)
 With obstructive sleep apnea.  CPAP at night

## 2023-06-16 ENCOUNTER — Emergency Department: Payer: Self-pay

## 2023-06-16 ENCOUNTER — Other Ambulatory Visit: Payer: Self-pay

## 2023-06-16 ENCOUNTER — Emergency Department
Admission: EM | Admit: 2023-06-16 | Discharge: 2023-06-16 | Disposition: A | Discharge status: Home / Self Care | Payer: Self-pay | Attending: Emergency Medicine | Admitting: Emergency Medicine

## 2023-06-16 DIAGNOSIS — Z720 Tobacco use: Secondary | ICD-10-CM | POA: Insufficient documentation

## 2023-06-16 DIAGNOSIS — J4541 Moderate persistent asthma with (acute) exacerbation: Secondary | ICD-10-CM | POA: Diagnosis not present

## 2023-06-16 DIAGNOSIS — J45901 Unspecified asthma with (acute) exacerbation: Secondary | ICD-10-CM | POA: Insufficient documentation

## 2023-06-16 DIAGNOSIS — I10 Essential (primary) hypertension: Secondary | ICD-10-CM | POA: Insufficient documentation

## 2023-06-16 LAB — CBC
HCT: 41.6 % (ref 39.0–52.0)
Hemoglobin: 13 g/dL (ref 13.0–17.0)
MCH: 26.9 pg (ref 26.0–34.0)
MCHC: 31.3 g/dL (ref 30.0–36.0)
MCV: 86.1 fL (ref 80.0–100.0)
Platelets: 422 10*3/uL — ABNORMAL HIGH (ref 150–400)
RBC: 4.83 MIL/uL (ref 4.22–5.81)
RDW: 14.2 % (ref 11.5–15.5)
WBC: 9 10*3/uL (ref 4.0–10.5)
nRBC: 0 % (ref 0.0–0.2)

## 2023-06-16 LAB — BASIC METABOLIC PANEL WITH GFR
Anion gap: 13 (ref 5–15)
BUN: 9 mg/dL (ref 6–20)
CO2: 23 mmol/L (ref 22–32)
Calcium: 8.8 mg/dL — ABNORMAL LOW (ref 8.9–10.3)
Chloride: 101 mmol/L (ref 98–111)
Creatinine, Ser: 0.81 mg/dL (ref 0.61–1.24)
GFR, Estimated: >60 mL/min (ref >60)
Glucose, Bld: 93 mg/dL (ref 70–99)
Potassium: 4 mmol/L (ref 3.5–5.1)
Sodium: 137 mmol/L (ref 135–145)

## 2023-06-16 MED ORDER — PREDNISONE 50 MG PO TABS
50.0000 mg | ORAL_TABLET | Freq: Every day | ORAL | 0 refills | Status: DC
  Filled 2023-06-16: qty 5, 5d supply, fill #0

## 2023-06-16 MED ORDER — ALBUTEROL SULFATE HFA 108 (90 BASE) MCG/ACT IN AERS
2.0000 | INHALATION_SPRAY | Freq: Four times a day (QID) | RESPIRATORY_TRACT | 2 refills | Status: DC | PRN
  Filled 2023-06-16: qty 6.7, 25d supply, fill #0
  Filled 2023-07-25: qty 6.7, 25d supply, fill #1
  Filled 2023-08-11 – 2023-08-19 (×2): qty 6.7, 25d supply, fill #2

## 2023-06-16 MED ORDER — PREDNISONE 20 MG PO TABS
60.0000 mg | ORAL_TABLET | Freq: Once | ORAL | Status: AC
  Administered 2023-06-16: 60 mg via ORAL
  Filled 2023-06-16: qty 3

## 2023-06-16 MED ORDER — ALBUTEROL SULFATE (2.5 MG/3ML) 0.083% IN NEBU: 2.5000 mg | INHALATION_SOLUTION | Freq: Once | RESPIRATORY_TRACT | Status: DC

## 2023-06-16 MED ORDER — IPRATROPIUM-ALBUTEROL 0.5-2.5 (3) MG/3ML IN SOLN
3.0000 mL | Freq: Once | RESPIRATORY_TRACT | Status: AC
  Administered 2023-06-16: 3 mL via RESPIRATORY_TRACT
  Filled 2023-06-16: qty 3

## 2023-06-16 NOTE — ED Provider Notes (Signed)
 [facility] Emergency Department Provider Note     Event Date/Time   First MD Initiated Contact with Patient 06/16/23 1635     (approximate)   History   Shortness of Breath   HPI  Justin Reid is a 43 y.o. male with a PMHx of severe persistent asthma, HTN, tobacco use and morbid obesity presents to the ED for evaluation of wheezing an shortness of breath onset of yesterday. Patient reports he ran out of his albuterol  inhaler yesterday. He uses this inhaler 1x daily. Denies chest pain, cough, hemoptysis, leg swelling, fever, recent travel or surgery and sick contacts.     Physical Exam   Triage Vital Signs: ED Triage Vitals  Encounter Vitals Group     BP 06/16/23 1522 (!) 143/100     Girls Systolic BP Percentile --      Girls Diastolic BP Percentile --      Boys Systolic BP Percentile --      Boys Diastolic BP Percentile --      Pulse Rate 06/16/23 1522 97     Resp 06/16/23 1522 17     Temp 06/16/23 1522 98.9 F (37.2 C)     Temp Source 06/16/23 1522 Oral     SpO2 06/16/23 1522 96 %     Weight 06/16/23 1517 (!) 310 lb (140.6 kg)     Height 06/16/23 1517 5' 10 (1.778 m)     Head Circumference --      Peak Flow --      Pain Score 06/16/23 1517 0     Pain Loc --      Pain Education --      Exclude from Growth Chart --     Most recent vital signs: Vitals:   06/16/23 1522  BP: (!) 143/100  Pulse: 97  Resp: 17  Temp: 98.9 F (37.2 C)  SpO2: 96%    General Awake, no distress.  HEENT NCAT.  CV:  Good peripheral perfusion. RRR RESP:  Mildly increased effort. Bilateral wheezing on auscultation. Audible expiratory wheezing can be heard. No rib retraction.   ABD:  No distention.   ED Results / Procedures / Treatments   Labs (all labs ordered are listed, but only abnormal results are displayed) Labs Reviewed  BASIC METABOLIC PANEL WITH GFR - Abnormal; Notable for the following components:      Result Value   Calcium  8.8 (*)     All other components within normal limits  CBC - Abnormal; Notable for the following components:   Platelets 422 (*)    All other components within normal limits   RADIOLOGY  I personally viewed and evaluated these images as part of my medical decision making, as well as reviewing the written report by the radiologist.  ED Provider Interpretation: No focal consolidation.  Normal chest x-ray.  DG Chest 2 View Result Date: 06/16/2023 CLINICAL DATA:  Shortness of breath EXAM: CHEST - 2 VIEW COMPARISON:  May twenty six two FINDINGS: The heart size and mediastinal contours are within normal limits. Both lungs are clear. The visualized skeletal structures are unremarkable. IMPRESSION: No active cardiopulmonary disease. Electronically Signed   By: Fredrich Jefferson M.D.   On: 06/16/2023 16:17    PROCEDURES:  Critical Care performed: No  Procedures   MEDICATIONS ORDERED IN ED: Medications  predniSONE  (DELTASONE ) tablet 60 mg (has no administration in time range)  ipratropium-albuterol  (DUONEB) 0.5-2.5 (3) MG/3ML nebulizer solution 3 mL (3 mLs Nebulization Given 06/16/23  1656)     IMPRESSION / MDM / ASSESSMENT AND PLAN / ED COURSE  I reviewed the triage vital signs and the nursing notes.                             Clinical Course as of 06/16/23 1725  Thu Jun 16, 2023  1647 CBCBritta Candy [MH]  8416 Basic metabolic panelBritta Candy [MH]  1648 DG Chest 2 View No acute cardiopulmonary pathology [MH]  1712 On reassessment following DuoNeb administration patient reports significant improvement with symptoms. LCTAB. Complete resolution of wheezing with normal respiratory effort.  [MH]    Clinical Course User Index [MH] Billye Buerger, PA-C   43 y.o. male presents to the emergency department for evaluation and treatment of acute wheezing. See HPI for further details.   Differential diagnosis includes, but is not limited to asthma exacerbation, PNA, bronchitis, COPD, PE  considered but less likely  Patient's presentation is most consistent with acute complicated illness / injury requiring diagnostic workup.  Patient presents with acute wheezing following running out of his albuterol  inhaler yesterday.  Patient has a history of severe persistent asthma exacerbations and today's presentation is clinically consistent with this however not severe.  He is alert and oriented.  He is hemodynamically stable.  Lung exam reveals mild bilateral expiratory wheezing on auscultation.  Administration of DuoNeb provided significant improvement.  On reassessment lungs are clear to auscultate bilaterally. EKG normal sinus rhythm. Chart reviewed. No indication for admission during this encounter. The patient is in stable and satisfactory condition for discharge home. Encouraged to follow up with PCP for further management. Referral ordered. ED precautions discussed. All questions and concerns were addressed during this ED visit.     FINAL CLINICAL IMPRESSION(S) / ED DIAGNOSES   Final diagnoses:  Exacerbation of persistent asthma, unspecified asthma severity   Rx / DC Orders   ED Discharge Orders          Ordered    albuterol  (VENTOLIN  HFA) 108 (90 Base) MCG/ACT inhaler  Every 6 hours PRN        06/16/23 1655    predniSONE  (DELTASONE ) 50 MG tablet  Daily with breakfast        06/16/23 1710    Ambulatory Referral to Primary Care (Establish Care)        06/16/23 1711             Note:  This document was prepared using Dragon voice recognition software and may include unintentional dictation errors.    Phyllis Breeze, Klaudia Beirne A, PA-C 06/16/23 1726    Bradler, Evan K, MD 06/16/23 1800

## 2023-06-16 NOTE — Discharge Instructions (Addendum)
 Please follow up with your PCP in 1 week for asthma medication management.  A list has been provided for you below and a referral has been ordered for you. Get plenty of rest and limit your exposure to known triggers.

## 2023-06-16 NOTE — ED Triage Notes (Signed)
 Pt to ED via POV from home. Pt reports SOB that started yesterday afternoon. Pt reports hx of asthma and ran out of albuterol  yesterday. Denies CP.

## 2023-07-11 ENCOUNTER — Encounter: Payer: Self-pay | Admitting: Emergency Medicine

## 2023-07-11 ENCOUNTER — Other Ambulatory Visit: Payer: Self-pay

## 2023-07-11 ENCOUNTER — Emergency Department
Admission: EM | Admit: 2023-07-11 | Discharge: 2023-07-11 | Disposition: A | Discharge status: Home / Self Care | Payer: Self-pay | Attending: Emergency Medicine | Admitting: Emergency Medicine

## 2023-07-11 DIAGNOSIS — R21 Rash and other nonspecific skin eruption: Secondary | ICD-10-CM | POA: Diagnosis not present

## 2023-07-11 DIAGNOSIS — L739 Follicular disorder, unspecified: Secondary | ICD-10-CM | POA: Insufficient documentation

## 2023-07-11 DIAGNOSIS — J45909 Unspecified asthma, uncomplicated: Secondary | ICD-10-CM | POA: Insufficient documentation

## 2023-07-11 DIAGNOSIS — I10 Essential (primary) hypertension: Secondary | ICD-10-CM | POA: Insufficient documentation

## 2023-07-11 LAB — CBC WITH DIFFERENTIAL/PLATELET
Abs Immature Granulocytes: 0.03 K/uL (ref 0.00–0.07)
Basophils Absolute: 0 K/uL (ref 0.0–0.1)
Basophils Relative: 0 %
Eosinophils Absolute: 0.4 K/uL (ref 0.0–0.5)
Eosinophils Relative: 5 %
HCT: 36.6 % — ABNORMAL LOW (ref 39.0–52.0)
Hemoglobin: 11.8 g/dL — ABNORMAL LOW (ref 13.0–17.0)
Immature Granulocytes: 0 %
Lymphocytes Relative: 21 %
Lymphs Abs: 1.7 K/uL (ref 0.7–4.0)
MCH: 28.2 pg (ref 26.0–34.0)
MCHC: 32.2 g/dL (ref 30.0–36.0)
MCV: 87.6 fL (ref 80.0–100.0)
Monocytes Absolute: 0.5 K/uL (ref 0.1–1.0)
Monocytes Relative: 6 %
Neutro Abs: 5.5 K/uL (ref 1.7–7.7)
Neutrophils Relative %: 68 %
Platelets: 334 K/uL (ref 150–400)
RBC: 4.18 MIL/uL — ABNORMAL LOW (ref 4.22–5.81)
RDW: 13.5 % (ref 11.5–15.5)
WBC: 8.1 K/uL (ref 4.0–10.5)
nRBC: 0 % (ref 0.0–0.2)

## 2023-07-11 LAB — BASIC METABOLIC PANEL WITH GFR
Anion gap: 11 (ref 5–15)
BUN: 12 mg/dL (ref 6–20)
CO2: 26 mmol/L (ref 22–32)
Calcium: 9.1 mg/dL (ref 8.9–10.3)
Chloride: 101 mmol/L (ref 98–111)
Creatinine, Ser: 0.91 mg/dL (ref 0.61–1.24)
GFR, Estimated: >60 mL/min (ref >60)
Glucose, Bld: 97 mg/dL (ref 70–99)
Potassium: 3.3 mmol/L — ABNORMAL LOW (ref 3.5–5.1)
Sodium: 138 mmol/L (ref 135–145)

## 2023-07-11 LAB — SEDIMENTATION RATE: Sed Rate: 45 mm/h — ABNORMAL HIGH (ref 0–15)

## 2023-07-11 MED ORDER — DEXAMETHASONE SODIUM PHOSPHATE 10 MG/ML IJ SOLN
10.0000 mg | Freq: Once | INTRAMUSCULAR | Status: AC
  Administered 2023-07-11: 10 mg via INTRAMUSCULAR
  Filled 2023-07-11: qty 1

## 2023-07-11 MED ORDER — PREDNISONE 10 MG PO TABS: ORAL_TABLET | ORAL | 0 refills | Status: DC

## 2023-07-11 MED ORDER — CEPHALEXIN 500 MG PO CAPS: 500.0000 mg | ORAL_CAPSULE | Freq: Three times a day (TID) | ORAL | 0 refills | Status: DC

## 2023-07-11 NOTE — ED Provider Notes (Signed)
 [facility] Provider Note    Event Date/Time   First MD Initiated Contact with Patient 07/11/23 1334     (approximate)   History   Rash   HPI  Justin Reid is a 43 y.o. male   presents to the ED with complaint of rash that started approximately 1 week ago.  Currently he has rash to his chest, back and face.  Patient states that the rash itches at times.  He also had similar rash 1 month ago which also itched and during that time he did not seek medical attention but took Benadryl  over-the-counter and this rash eventually went away.  Patient denies any new foods, clothing, exposure to poison oak or poison ivy.  He denies any difficulty breathing, shortness of breath, fever, chills.  He also is reporting some bilateral knee pain with a history of bilateral knee effusions.  No recent injury.  Patient has history of asthma, hypertension, GERD, noncardiac chest pain, arthritis and past history of shingles.      Physical Exam   Triage Vital Signs: ED Triage Vitals  Encounter Vitals Group     BP 07/11/23 1229 133/80     Girls Systolic BP Percentile --      Girls Diastolic BP Percentile --      Boys Systolic BP Percentile --      Boys Diastolic BP Percentile --      Pulse Rate 07/11/23 1229 92     Resp 07/11/23 1229 19     Temp 07/11/23 1229 98.6 F (37 C)     Temp Source 07/11/23 1229 Oral     SpO2 07/11/23 1229 95 %     Weight 07/11/23 1233 (!) 310 lb (140.6 kg)     Height 07/11/23 1233 5' 9 (1.753 m)     Head Circumference --      Peak Flow --      Pain Score 07/11/23 1233 10     Pain Loc --      Pain Education --      Exclude from Growth Chart --     Most recent vital signs: Vitals:   07/11/23 1229  BP: 133/80  Pulse: 92  Resp: 19  Temp: 98.6 F (37 C)  SpO2: 95%     General: Awake, no distress.  CV:  Good peripheral perfusion.  Resp:  Normal effort.  Abd:  No distention.  Other:  Anterior chest, back and posterior neck all with  multiple erythematous papules with some having small pustular area.  Nontender to palpation.  There is also similar papules noted to the beard area and facial areas.  Some of these have been unroofed as patient has been scratching at the ones on his chest.  No purulent drainage.   ED Results / Procedures / Treatments   Labs (all labs ordered are listed, but only abnormal results are displayed) Labs Reviewed  CBC WITH DIFFERENTIAL/PLATELET - Abnormal; Notable for the following components:      Result Value   RBC 4.18 (*)    Hemoglobin 11.8 (*)    HCT 36.6 (*)    All other components within normal limits  BASIC METABOLIC PANEL WITH GFR - Abnormal; Notable for the following components:   Potassium 3.3 (*)    All other components within normal limits  SEDIMENTATION RATE - Abnormal; Notable for the following components:   Sed Rate 45 (*)    All other components within normal limits  PROCEDURES:  Critical Care performed:   Procedures   MEDICATIONS ORDERED IN ED: Medications  dexamethasone  (DECADRON ) injection 10 mg (10 mg Intramuscular Given 07/11/23 1515)     IMPRESSION / MDM / ASSESSMENT AND PLAN / ED COURSE  I reviewed the triage vital signs and the nursing notes.   Differential diagnosis includes, but is not limited to, skin eruption, folliculitis, contact dermatitis, allergic dermatitis  43 year old male presents to the ED with complaint of recurrent rash.  Patient had similar rash 1 month ago and took Benadryl  and now rash has reoccurred.  Patient complains of itchiness.  Rash as noted above.  In looking it appears that most of the papules are around a follicle and could be folliculitis.  I explained to patient and wife that if this continues he is going to need to see a dermatologist and 2 offices were listed on his discharge papers.  Patient will take Benadryl  as needed for itching.  He was given Decadron  10 mg IM while in the ED with a tapered dose along with  prescription for Keflex .  He is strongly encouraged not to scratch these areas to keep them from getting infected.  Also the steroid injection and taper should help with his arthritis.      Patient's presentation is most consistent with acute complicated illness / injury requiring diagnostic workup.  FINAL CLINICAL IMPRESSION(S) / ED DIAGNOSES   Final diagnoses:  Rash and nonspecific skin eruption  Folliculitis     Rx / DC Orders   ED Discharge Orders          Ordered    cephALEXin  (KEFLEX ) 500 MG capsule  3 times daily        07/11/23 1510    predniSONE  (DELTASONE ) 10 MG tablet        07/11/23 1510             Note:  This document was prepared using Dragon voice recognition software and may include unintentional dictation errors.   Saunders Shona CROME, PA-C 07/11/23 1545    Floy Roberts, MD 07/13/23 [phone]

## 2023-07-11 NOTE — Discharge Instructions (Addendum)
 Prescription was sent to the pharmacy for you to begin taking today.  1 is a steroid which will help with the itching and the other is an antibiotic.  You may also continue taking the Benadryl  as needed for itching.  Do not scratch these areas as it may increase your risk for infection.

## 2023-07-11 NOTE — ED Triage Notes (Signed)
 Patient to ED via POV for rash on chest/back and face. States this occurred 1 month ago then went aware but came back 1 week ago. PT also reporting bilateral knee pain- states joints are swollen. Denies injury.

## 2023-07-25 ENCOUNTER — Other Ambulatory Visit: Payer: Self-pay

## 2023-07-25 ENCOUNTER — Emergency Department
Admission: EM | Admit: 2023-07-25 | Discharge: 2023-07-25 | Disposition: A | Discharge status: Home / Self Care | Payer: Self-pay | Attending: Emergency Medicine | Admitting: Emergency Medicine

## 2023-07-25 DIAGNOSIS — M25561 Pain in right knee: Secondary | ICD-10-CM | POA: Diagnosis not present

## 2023-07-25 DIAGNOSIS — M5459 Other low back pain: Secondary | ICD-10-CM | POA: Diagnosis not present

## 2023-07-25 DIAGNOSIS — M25562 Pain in left knee: Secondary | ICD-10-CM | POA: Diagnosis not present

## 2023-07-25 DIAGNOSIS — M545 Low back pain, unspecified: Secondary | ICD-10-CM | POA: Insufficient documentation

## 2023-07-25 MED ORDER — PREDNISONE 50 MG PO TABS
50.0000 mg | ORAL_TABLET | Freq: Every day | ORAL | 0 refills | Status: AC
  Filled 2023-07-25 – 2023-09-01 (×2): qty 3, 3d supply, fill #0

## 2023-07-25 MED ORDER — MELOXICAM 15 MG PO TABS
15.0000 mg | ORAL_TABLET | Freq: Every day | ORAL | 2 refills | Status: AC
  Filled 2023-07-25 – 2023-09-01 (×2): qty 30, 30d supply, fill #0

## 2023-07-25 MED ORDER — IBUPROFEN 600 MG PO TABS
600.0000 mg | ORAL_TABLET | Freq: Once | ORAL | Status: AC
  Administered 2023-07-25: 600 mg via ORAL
  Filled 2023-07-25: qty 1

## 2023-07-25 MED ORDER — OXYCODONE-ACETAMINOPHEN 5-325 MG PO TABS
1.0000 | ORAL_TABLET | Freq: Once | ORAL | Status: AC
  Administered 2023-07-25: 1 via ORAL
  Filled 2023-07-25: qty 1

## 2023-07-25 MED ORDER — CYCLOBENZAPRINE HCL 10 MG PO TABS
10.0000 mg | ORAL_TABLET | Freq: Once | ORAL | Status: AC
  Administered 2023-07-25: 10 mg via ORAL
  Filled 2023-07-25: qty 1

## 2023-07-25 MED ORDER — CYCLOBENZAPRINE HCL 10 MG PO TABS
10.0000 mg | ORAL_TABLET | Freq: Three times a day (TID) | ORAL | 0 refills | Status: AC | PRN
  Filled 2023-07-25: qty 12, 4d supply, fill #0

## 2023-07-25 NOTE — ED Provider Triage Note (Addendum)
 Emergency Medicine Provider Triage Evaluation Note  Justin Reid , a 43 y.o. male  was evaluated in triage.  Pt complains of back pain and bilateral knee pain. No falls or trauma.  Review of Systems  Positive: See above Negative: Bladder bowel function changes, dysuria  Physical Exam  There were no vitals taken for this visit. Gen:   Awake, no distress   Resp:  Normal effort  MSK:   Moves extremities without difficulty  Other:  Mild TTP over lumbar vertebrae  Medical Decision Making  Medically screening exam initiated at 1:59 PM.  Appropriate orders placed.  DONTREL SMETHERS was informed that the remainder of the evaluation will be completed by another provider, this initial triage assessment does not replace that evaluation, and the importance of remaining in the ED until their evaluation is complete.     Cleaster Tinnie LABOR, PA-C 07/25/23 1401    Cleaster Tinnie LABOR, PA-C 07/25/23 1402

## 2023-07-25 NOTE — ED Triage Notes (Signed)
 To ED POV for lower back pain and bilateral knee pain since yesterday. No injuries.

## 2023-07-25 NOTE — ED Provider Notes (Signed)
 [facility] Provider Note   Event Date/Time   First MD Initiated Contact with Patient 07/25/23 1520     (approximate) History  Back Pain and Knee Pain  HPI Justin Reid is a 43 y.o. male with a past medical history of chronic lumbar back pain who presents complaining of worsening of his chronic lumbar back pain with associated bilateral knee pain after increased activity over the last few days.  Patient states this happens occasionally when he overdoes it.  Patient denies any shooting pain down either leg.  Patient states that he is ambulatory with difficulty but can walk unassisted ROS: Patient currently denies any vision changes, tinnitus, difficulty speaking, facial droop, sore throat, chest pain, shortness of breath, abdominal pain, nausea/vomiting/diarrhea, dysuria, or numbness/paresthesias in any extremity   Physical Exam  Triage Vital Signs: ED Triage Vitals  Encounter Vitals Group     BP 07/25/23 1401 (!) 148/109     Girls Systolic BP Percentile --      Girls Diastolic BP Percentile --      Boys Systolic BP Percentile --      Boys Diastolic BP Percentile --      Pulse Rate 07/25/23 1401 85     Resp 07/25/23 1401 20     Temp 07/25/23 1403 97.9 F (36.6 C)     Temp Source 07/25/23 1403 Oral     SpO2 07/25/23 1401 97 %     Weight 07/25/23 1400 300 lb (136.1 kg)     Height 07/25/23 1400 5' 9 (1.753 m)     Head Circumference --      Peak Flow --      Pain Score 07/25/23 1510 10     Pain Loc --      Pain Education --      Exclude from Growth Chart --    Most recent vital signs: Vitals:   07/25/23 1401 07/25/23 1403  BP: (!) 148/109   Pulse: 85   Resp: 20   Temp:  97.9 F (36.6 C)  SpO2: 97%    General: Awake, oriented x4. CV:  Good peripheral perfusion. Resp:  Normal effort. Abd:  No distention. Other:  Middle-aged morbidly obese resting comfortably in no acute distress ED Results / Procedures / Treatments  Labs (all labs ordered  are listed, but only abnormal results are displayed) Labs Reviewed - No data to display PROCEDURES: Critical Care performed: No Procedures MEDICATIONS ORDERED IN ED: Medications  cyclobenzaprine  (FLEXERIL ) tablet 10 mg (10 mg Oral Given 07/25/23 1545)  oxyCODONE -acetaminophen  (PERCOCET/ROXICET) 5-325 MG per tablet 1 tablet (1 tablet Oral Given 07/25/23 1545)  ibuprofen  (ADVIL ) tablet 600 mg (600 mg Oral Given 07/25/23 1545)   IMPRESSION / MDM / ASSESSMENT AND PLAN / ED COURSE  I reviewed the triage vital signs and the nursing notes.                             The patient is on the cardiac monitor to evaluate for evidence of arrhythmia and/or significant heart rate changes. Patient's presentation is most consistent with acute presentation with potential threat to life or bodily function. Patient presents for low back pain. Given History and Exam the patient appears to be at low risk for Spinal Cord Compression Syndrome, Vertebral Malignancy/Mets, acute Spinal Fracture, Vertebral Osteomyelitis, Epidural Abscess, Infected or Obstructing Kidney Stone.  Their presentation appears most likely to be secondary to non-emergent musculoskeletal etiology vs  non-emergent disc herniation.  ED Workup: Defer imaging and labwork for outpatient follow up at this time.  Disposition: Discharge. Strict return precautions discussed with patient with full understanding. Advised patient to follow up promptly with primary care provider   FINAL CLINICAL IMPRESSION(S) / ED DIAGNOSES   Final diagnoses:  Acute bilateral low back pain without sciatica  Acute pain of both knees   Rx / DC Orders   ED Discharge Orders          Ordered    cyclobenzaprine  (FLEXERIL ) 10 MG tablet  3 times daily PRN        07/25/23 1552    meloxicam  (MOBIC ) 15 MG tablet  Daily        07/25/23 1552    predniSONE  (DELTASONE ) 50 MG tablet  Daily with breakfast        07/25/23 1552           Note:  This document was prepared  using Dragon voice recognition software and may include unintentional dictation errors.   Jossie Artist POUR, MD 07/25/23 [phone]

## 2023-08-04 ENCOUNTER — Other Ambulatory Visit: Payer: Self-pay

## 2023-08-08 ENCOUNTER — Other Ambulatory Visit: Payer: Self-pay

## 2023-08-08 ENCOUNTER — Emergency Department
Admission: EM | Admit: 2023-08-08 | Discharge: 2023-08-08 | Disposition: A | Discharge status: Home / Self Care | Payer: Self-pay | Attending: Emergency Medicine | Admitting: Emergency Medicine

## 2023-08-08 ENCOUNTER — Encounter: Payer: Self-pay | Admitting: Emergency Medicine

## 2023-08-08 DIAGNOSIS — G8929 Other chronic pain: Secondary | ICD-10-CM | POA: Diagnosis not present

## 2023-08-08 DIAGNOSIS — M542 Cervicalgia: Secondary | ICD-10-CM | POA: Insufficient documentation

## 2023-08-08 DIAGNOSIS — M545 Low back pain, unspecified: Secondary | ICD-10-CM | POA: Diagnosis not present

## 2023-08-08 DIAGNOSIS — M25561 Pain in right knee: Secondary | ICD-10-CM | POA: Insufficient documentation

## 2023-08-08 DIAGNOSIS — M25562 Pain in left knee: Secondary | ICD-10-CM | POA: Diagnosis not present

## 2023-08-08 MED ORDER — OXYCODONE-ACETAMINOPHEN 5-325 MG PO TABS
1.0000 | ORAL_TABLET | Freq: Once | ORAL | Status: AC
  Administered 2023-08-08: 1 via ORAL
  Filled 2023-08-08: qty 1

## 2023-08-08 MED ORDER — OXYCODONE-ACETAMINOPHEN 5-325 MG PO TABS
1.0000 | ORAL_TABLET | Freq: Four times a day (QID) | ORAL | 0 refills | Status: AC | PRN
  Filled 2023-08-08: qty 6, 2d supply, fill #0

## 2023-08-08 MED ORDER — KETOROLAC TROMETHAMINE 15 MG/ML IJ SOLN
15.0000 mg | Freq: Once | INTRAMUSCULAR | Status: AC
  Administered 2023-08-08: 15 mg via INTRAMUSCULAR
  Filled 2023-08-08: qty 1

## 2023-08-08 MED ORDER — CYCLOBENZAPRINE HCL 10 MG PO TABS
10.0000 mg | ORAL_TABLET | Freq: Once | ORAL | Status: AC
  Administered 2023-08-08: 10 mg via ORAL
  Filled 2023-08-08: qty 1

## 2023-08-08 MED ORDER — NAPROXEN 500 MG PO TABS
500.0000 mg | ORAL_TABLET | Freq: Two times a day (BID) | ORAL | 0 refills | Status: AC
  Filled 2023-08-08: qty 14, 7d supply, fill #0

## 2023-08-08 MED ORDER — LIDOCAINE 5 % EX PTCH
1.0000 | MEDICATED_PATCH | Freq: Once | CUTANEOUS | Status: DC
  Administered 2023-08-08: 1 via TRANSDERMAL
  Filled 2023-08-08: qty 1

## 2023-08-08 NOTE — ED Provider Notes (Signed)
 [facility] Provider Note    Event Date/Time   First MD Initiated Contact with Patient 08/08/23 [phone]     (approximate)   History   Neck Pain and Back Pain   HPI  ISMEAL HEIDER is a 43 year old male presenting to the emergency department for evaluation of back and neck pain.  Reports that for the past several months he has had intermittent episodes of low back and knee pain.  Yesterday, he noticed that he had some pain seem to be radiating up into his neck.  No numbness, tingling, focal weakness.  No new bowel or bladder symptoms.  No fevers.  Seen in the ER on 7/21 for low back pain and knee pain, discharged on NSAID, prednisone , Flexeril .  Reports limited improvement with these medications.     Physical Exam   Triage Vital Signs: ED Triage Vitals [08/08/23 0852]  Encounter Vitals Group     BP (!) 135/108     Girls Systolic BP Percentile      Girls Diastolic BP Percentile      Boys Systolic BP Percentile      Boys Diastolic BP Percentile      Pulse Rate 86     Resp 18     Temp 98.1 F (36.7 C)     Temp Source Oral     SpO2 98 %     Weight (!) 310 lb (140.6 kg)     Height 5' 11 (1.803 m)     Head Circumference      Peak Flow      Pain Score 10     Pain Loc      Pain Education      Exclude from Growth Chart     Most recent vital signs: Vitals:   08/08/23 0852  BP: (!) 135/108  Pulse: 86  Resp: 18  Temp: 98.1 F (36.7 C)  SpO2: 98%     General: Awake, interactive  CV:  Regular rate, good peripheral perfusion.  Resp:  Unlabored respirations.  Abd:  Nondistended.  Neuro:  Symmetric facial movement, fluid speech, 5 out of 5 strength in the bilateral upper and lower extremities MSK:  Tenderness of the posterior neck most notably over the left cervical paraspinous musculature.  No focal area of midline tenderness in the cervical spine or throughout the remainder of the back.  There is also paraspinous muscle tenderness of the  bilateral lumbar region.   ED Results / Procedures / Treatments   Labs (all labs ordered are listed, but only abnormal results are displayed) Labs Reviewed - No data to display   EKG EKG independently reviewed and interpreted by myself demonstrates:    RADIOLOGY Imaging independently reviewed and interpreted by myself demonstrates:   Formal Radiology Read:  No results found.  PROCEDURES:  Critical Care performed: No  Procedures   MEDICATIONS ORDERED IN ED: Medications  lidocaine  (LIDODERM ) 5 % 1 patch (1 patch Transdermal Patch Applied 08/08/23 0914)  cyclobenzaprine  (FLEXERIL ) tablet 10 mg (10 mg Oral Given 08/08/23 0917)  oxyCODONE -acetaminophen  (PERCOCET/ROXICET) 5-325 MG per tablet 1 tablet (1 tablet Oral Given 08/08/23 0913)  ketorolac  (TORADOL ) 15 MG/ML injection 15 mg (15 mg Intramuscular Given 08/08/23 0914)     IMPRESSION / MDM / ASSESSMENT AND PLAN / ED COURSE  I reviewed the triage vital signs and the nursing notes.  Differential diagnosis includes, but is not limited to, musculoskeletal strain, no focal neurologic deficits, bowel or bladder symptoms suggestive of acute  spinal cord pathology.  No radicular symptoms.  No fever or infectious symptoms.  Patient's presentation is most consistent with acute illness / injury with system symptoms.  43 year old male presenting to the emergency department for evaluation of subacute back pain, knee pain.  Does report new atraumatic neck pain.  No focal midline pain or neurologic deficits.  Low suspicion emergent process.  Do not feel there is an indication for imaging currently.  Will treat with multimodal pain control and reevaluate.  1014 Patient reassessed and reports improvement in pain.  Patient does report refractory pain despite prior prescriptions of Flexeril , steroids, NSAIDs.  With this, will DC with very short course of narcotics, trial different NSAID to see if this is more effective.  Given information for  follow-up with neurosurgery.  Strict return precautions provided.  Patient discharged in stable condition.      FINAL CLINICAL IMPRESSION(S) / ED DIAGNOSES   Final diagnoses:  Neck pain  Bilateral low back pain without sciatica, unspecified chronicity  Chronic pain of both knees     Rx / DC Orders   ED Discharge Orders          Ordered    oxyCODONE -acetaminophen  (PERCOCET) 5-325 MG tablet  Every 6 hours PRN        08/08/23 1017    naproxen  (NAPROSYN ) 500 MG tablet  2 times daily with meals        08/08/23 1017             Note:  This document was prepared using Dragon voice recognition software and may include unintentional dictation errors.   Levander Slate, MD 08/08/23 1017

## 2023-08-08 NOTE — ED Triage Notes (Signed)
 Patient to ED via POV for neck/back pain that radiates into bilateral knees. States pain started yesterday- denies injury. Seen on 7/21 for same.

## 2023-08-08 NOTE — Discharge Instructions (Signed)
 You were seen in the emergency department today for evaluation of your back and neck pain.  Your exam was overall reassuring.  You can continue use the muscle relaxer you were previously prescribed.  I have sent a different anti-inflammatory for you to take.  Do not take this with other NSAIDs such as meloxicam , ibuprofen , Aleve .    I have sent a prescription for a narcotic pain medicine for severe breathrough pain. Do not drink alcohol, drive or participate in any other potentially dangerous activities while taking this medication as it may make you sleepy. Do not take this medication with any other sedating medications, either prescription or over-the-counter.  This medication is intended for your use only - do not give any to anyone else and keep it in a secure place where nobody else, especially children, have access to it.  It can also cause or worsen constipation, so you may want to consider taking an over-the-counter stool softener while you are taking this medication.

## 2023-08-11 ENCOUNTER — Other Ambulatory Visit: Payer: Self-pay

## 2023-08-19 ENCOUNTER — Emergency Department
Admission: EM | Admit: 2023-08-19 | Discharge: 2023-08-19 | Disposition: A | Discharge status: Home / Self Care | Payer: Self-pay | Attending: Emergency Medicine | Admitting: Emergency Medicine

## 2023-08-19 ENCOUNTER — Emergency Department: Payer: Self-pay

## 2023-08-19 ENCOUNTER — Other Ambulatory Visit: Payer: Self-pay

## 2023-08-19 DIAGNOSIS — M5441 Lumbago with sciatica, right side: Secondary | ICD-10-CM | POA: Diagnosis not present

## 2023-08-19 DIAGNOSIS — X500XXA Overexertion from strenuous movement or load, initial encounter: Secondary | ICD-10-CM | POA: Insufficient documentation

## 2023-08-19 DIAGNOSIS — M545 Low back pain, unspecified: Secondary | ICD-10-CM | POA: Diagnosis present

## 2023-08-19 DIAGNOSIS — G8929 Other chronic pain: Secondary | ICD-10-CM | POA: Diagnosis not present

## 2023-08-19 DIAGNOSIS — M5442 Lumbago with sciatica, left side: Secondary | ICD-10-CM | POA: Diagnosis not present

## 2023-08-19 MED ORDER — METHOCARBAMOL 500 MG PO TABS
500.0000 mg | ORAL_TABLET | Freq: Three times a day (TID) | ORAL | 0 refills | Status: AC | PRN
Start: 2023-08-19 — End: 2023-09-02
  Filled 2023-08-19: qty 21, 7d supply, fill #0

## 2023-08-19 MED ORDER — OXYCODONE-ACETAMINOPHEN 5-325 MG PO TABS
1.0000 | ORAL_TABLET | Freq: Once | ORAL | Status: AC
  Administered 2023-08-19: 1 via ORAL
  Filled 2023-08-19: qty 1

## 2023-08-19 MED ORDER — LIDOCAINE 4 % EX PTCH
1.0000 | MEDICATED_PATCH | CUTANEOUS | 0 refills | Status: AC
Start: 2023-08-19 — End: 2023-09-18
  Filled 2023-08-19: qty 6, 6d supply, fill #0

## 2023-08-19 MED ORDER — IBUPROFEN 600 MG PO TABS
600.0000 mg | ORAL_TABLET | Freq: Once | ORAL | Status: AC
  Administered 2023-08-19: 600 mg via ORAL
  Filled 2023-08-19: qty 1

## 2023-08-19 MED ORDER — PREDNISONE 50 MG PO TABS
ORAL_TABLET | ORAL | 0 refills | Status: AC
Start: 2023-08-19 — End: 2023-08-23
  Filled 2023-08-19: qty 5, 4d supply, fill #0

## 2023-08-19 MED ORDER — CYCLOBENZAPRINE HCL 10 MG PO TABS
5.0000 mg | ORAL_TABLET | Freq: Once | ORAL | Status: AC
  Administered 2023-08-19: 5 mg via ORAL
  Filled 2023-08-19: qty 1

## 2023-08-19 MED ORDER — LIDOCAINE 5 % EX PTCH
1.0000 | MEDICATED_PATCH | CUTANEOUS | Status: DC
  Administered 2023-08-19: 1 via TRANSDERMAL
  Filled 2023-08-19: qty 1

## 2023-08-19 NOTE — ED Provider Triage Note (Signed)
 Emergency Medicine Provider Triage Evaluation Note  Justin Reid , a 43 y.o. male  was evaluated in triage.  Pt complains of low back pain.  Increased with movement.  No known injury.  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  States very painful to get up and down..  Review of Systems  Positive:  Negative:   Physical Exam  BP (!) 139/99 (BP Location: Right Arm)   Pulse 90   Temp 98.2 F (36.8 C) (Oral)   Resp 16   Ht 5' 10 (1.778 m)   Wt (!) 140.6 kg   SpO2 94%   BMI 44.48 kg/m  Gen:   Awake, no distress   Resp:  Normal effort  MSK:   Moves extremities without difficulty  Other:  Lumbar spine tender  Medical Decision Making  Medically screening exam initiated at 2:49 PM.  Appropriate orders placed.  Justin Reid was informed that the remainder of the evaluation will be completed by another provider, this initial triage assessment does not replace that evaluation, and the importance of remaining in the ED until their evaluation is complete.     Gasper Devere ORN, PA-C 08/19/23 1450

## 2023-08-19 NOTE — ED Triage Notes (Signed)
 Pt arrives with c/o lower back pain that started last night. Per pt, pain radiates into his legs. Pt does have degenerative disk disease. Pt denies injury/trauma or urinary/stool incontinence.

## 2023-08-19 NOTE — ED Provider Notes (Signed)
 [facility] Provider Note    Event Date/Time   First MD Initiated Contact with Patient 08/19/23 1534     (approximate)   History   Back Pain   HPI  Justin Reid is a 43 y.o. male  with a past medical history of degenerative disc disease with chronic lumbar back pain presents to the emergency department with low back pain that started last night.  Patient states this pain radiates into his knees.  Patient denies history of cancer, fever, IV drug use, saddle anesthesia, bowel incontinence or urinary retention, recent weight loss, fall or injury.  No chest pain, shortness of breath, abdominal pain, numbness, focal weakness.  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  Patient has tried Goody's powder for his pain without relief.  Patient does have a primary care provider established but does not have an appointment until October.   Reviewed note from 7/21 where patient was seen for chronic lumbar back pain and bilateral knee pain, discharged home on prednisone , meloxicam , and Flexeril .  Patient also seen on 8/4 for chronic lumbar back pain, b/l knee pain and neck pain, discharged with Percocet and naproxen . States Percocet and Naproxen  did help, but he has run out of these medications. Patient has not followed up with neurosurgery. Previous X-rays taken on 04/11/2023 show mild degenerative disc disease at T12-L1 and L3-L4.     Physical Exam   Triage Vital Signs: ED Triage Vitals  Encounter Vitals Group     BP 08/19/23 1443 (!) 139/99     Girls Systolic BP Percentile --      Girls Diastolic BP Percentile --      Boys Systolic BP Percentile --      Boys Diastolic BP Percentile --      Pulse Rate 08/19/23 1443 90     Resp 08/19/23 1443 16     Temp 08/19/23 1443 98.2 F (36.8 C)     Temp Source 08/19/23 1443 Oral     SpO2 08/19/23 1443 94 %     Weight 08/19/23 1443 (!) 310 lb (140.6 kg)      Height 08/19/23 1443 5' 10 (1.778 m)     Head Circumference --      Peak Flow --      Pain Score 08/19/23 1449 10     Pain Loc --      Pain Education --      Exclude from Growth Chart --     Most recent vital signs: Vitals:   08/19/23 1443 08/19/23 1532  BP: (!) 139/99   Pulse: 90   Resp: 16   Temp: 98.2 F (36.8 C)   SpO2: 94% 100%    General: Well-appearing, in no acute distress. Appears stated age. Head: Normocephalic, atraumatic. CV: Regular rate, 90 bpm. Dorsalis pedis pulses 2+ bilaterally. <2 second capillary refill. Respiratory: Breath sounds clear b/l. No wheezes, rales, or rhonchi. No respiratory distress. Normal respiratory effort. Skin:Warm, dry, intact.  Neurological: A&Ox4 to person, place, time, and situation. Sensation intact to L4, L5, and S1.  GI: Soft, non-distended, non-tender. No rebound or guarding.  MSK: Endorses midline lumbar spinal and paraspinal tenderness. Normal ROM with knee extension, dorsiflexion, and plantarflexion and 5/5 strength in bilateral lower extremities. No swelling or obvious deformities.  Straight leg raise positive bilaterally. No CVA tenderness bilaterally.    ED Results / Procedures / Treatments  Labs (all labs ordered are listed, but only abnormal results are displayed) Labs Reviewed - No data to display   EKG    RADIOLOGY X-ray lumbar spine ordered in triage.  FINDINGS: Normal alignment of lumbar vertebral bodies. No loss of vertebral body height or disc height. No pars fracture. No subluxation.   IMPRESSION: No acute findings lumbar spine.     Electronically Signed   By: Jackquline Boxer M.D.   On: 08/19/2023 15:52   PROCEDURES:  Critical Care performed: No   Procedures   MEDICATIONS ORDERED IN ED: Medications  lidocaine  (LIDODERM ) 5 % 1 patch (1 patch Transdermal Patch Applied 08/19/23 1602)  cyclobenzaprine  (FLEXERIL ) tablet 5 mg (5 mg Oral Given 08/19/23 1617)  ibuprofen  (ADVIL ) tablet 600 mg  (600 mg Oral Given 08/19/23 1602)  oxyCODONE -acetaminophen  (PERCOCET/ROXICET) 5-325 MG per tablet 1 tablet (1 tablet Oral Given 08/19/23 1601)     IMPRESSION / MDM / ASSESSMENT AND PLAN / ED COURSE  I reviewed the triage vital signs and the nursing notes.                              Differential diagnosis includes, but is not limited to, sciatica, degenerative disc disease, Msk strain, epidural abscess, cauda equina syndrome  Patient's presentation is most consistent with exacerbation of chronic illness.  Patient has no focal neurologic deficits, no red flag symptoms of back pain, less concerning for epidural abscess or cauda equina syndrome.  Patient did have some midline lumbar and paraspinal lumbar tenderness x-ray was ordered in triage.  Reveals no acute findings, I agree with the radiologist report. Patient was given lidocaine  patch, Flexeril , ibuprofen , Percocet in the ER and states he feels his pain is better at this time.  Will trial him with lidocaine  patches, a different muscle relaxer, and another prednisone  pack to help with his pain.  Precautions were discussed regarding taking these medications.  ER return precautions were discussed regarding any red flag symptoms of back pain.  Provided another referral to neurosurgery, since he was not able to follow-up with them after the last ER visit.  He should follow-up with his primary care provider regarding today's visit.  Patient was given the opportunity to ask questions; all questions were answered. Emergency department return precautions were discussed with the patient.  Patient is in agreement to the treatment plan.  Patient is stable for discharge.    FINAL CLINICAL IMPRESSION(S) / ED DIAGNOSES   Final diagnoses:  Chronic bilateral low back pain with bilateral sciatica     Rx / DC Orders   ED Discharge Orders          Ordered    predniSONE  (DELTASONE ) 50 MG tablet        08/19/23 1657    lidocaine  (LIDODERM ) 5 %  Every 24  hours        08/19/23 1657    methocarbamol  (ROBAXIN ) 500 MG tablet  Every 8 hours PRN        08/19/23 1657             Note:  This document was prepared using Dragon voice recognition software and may include unintentional dictation errors.     Sheron Salm, PA-C 08/19/23 1704    Levander Slate, MD 08/19/23 [phone]

## 2023-08-19 NOTE — Discharge Instructions (Addendum)
 You were evaluated in the Emergency Department today for back pain. Your evaluation suggests no acute abnormalities which require further intervention at this time.   - Move around as tolerated but avoiding heavy lifting. "Bed rest" is not recommended nor is it the best treatment for low back pain.  -- Do not drink alcohol, drive a car, operate machinery, or get up on ladders or heights when taking any prescribed pain medications (Methocarbamol ).  -- Do not drive home if you received prescribed pain medications here in the ED.  Please follow up with your primary care physician as needed or any other providers listed in this paperwork. If you do not have a primary doctor, you can call your insurance company to find one.  If you do not have insurance, you can go to the finance/registration department for more assistance.  Return to the ED immediately if you develop any of the following problems: -- Leaking urine or difficulty urinating; -- Inability to control your bowels; -- New numbness or weakness in your legs or numbness between your legs; -- Inability to walk -- Fever

## 2023-09-01 ENCOUNTER — Other Ambulatory Visit: Payer: Self-pay

## 2023-09-06 ENCOUNTER — Emergency Department
Admission: EM | Admit: 2023-09-06 | Discharge: 2023-09-06 | Disposition: A | Discharge status: Home / Self Care | Payer: MEDICAID | Attending: Emergency Medicine | Admitting: Emergency Medicine

## 2023-09-06 ENCOUNTER — Other Ambulatory Visit: Payer: Self-pay

## 2023-09-06 ENCOUNTER — Emergency Department: Payer: MEDICAID

## 2023-09-06 DIAGNOSIS — R0602 Shortness of breath: Secondary | ICD-10-CM | POA: Diagnosis not present

## 2023-09-06 DIAGNOSIS — I10 Essential (primary) hypertension: Secondary | ICD-10-CM | POA: Diagnosis not present

## 2023-09-06 DIAGNOSIS — E871 Hypo-osmolality and hyponatremia: Secondary | ICD-10-CM | POA: Diagnosis not present

## 2023-09-06 DIAGNOSIS — J45901 Unspecified asthma with (acute) exacerbation: Secondary | ICD-10-CM | POA: Insufficient documentation

## 2023-09-06 LAB — TROPONIN I (HIGH SENSITIVITY): Troponin I (High Sensitivity): 8 ng/L (ref <18)

## 2023-09-06 LAB — CBC
HCT: 40.1 % (ref 39.0–52.0)
Hemoglobin: 13.2 g/dL (ref 13.0–17.0)
MCH: 27.4 pg (ref 26.0–34.0)
MCHC: 32.9 g/dL (ref 30.0–36.0)
MCV: 83.2 fL (ref 80.0–100.0)
Platelets: 470 K/uL — ABNORMAL HIGH (ref 150–400)
RBC: 4.82 MIL/uL (ref 4.22–5.81)
RDW: 14 % (ref 11.5–15.5)
WBC: 8.9 K/uL (ref 4.0–10.5)
nRBC: 0 % (ref 0.0–0.2)

## 2023-09-06 LAB — BASIC METABOLIC PANEL WITH GFR
Anion gap: 10 (ref 5–15)
BUN: 21 mg/dL — ABNORMAL HIGH (ref 6–20)
CO2: 24 mmol/L (ref 22–32)
Calcium: 8.6 mg/dL — ABNORMAL LOW (ref 8.9–10.3)
Chloride: 99 mmol/L (ref 98–111)
Creatinine, Ser: 0.7 mg/dL (ref 0.61–1.24)
GFR, Estimated: >60 mL/min (ref >60)
Glucose, Bld: 103 mg/dL — ABNORMAL HIGH (ref 70–99)
Potassium: 3.9 mmol/L (ref 3.5–5.1)
Sodium: 133 mmol/L — ABNORMAL LOW (ref 135–145)

## 2023-09-06 MED ORDER — IPRATROPIUM-ALBUTEROL 0.5-2.5 (3) MG/3ML IN SOLN
3.0000 mL | Freq: Once | RESPIRATORY_TRACT | Status: AC
  Administered 2023-09-06: 3 mL via RESPIRATORY_TRACT
  Filled 2023-09-06: qty 3

## 2023-09-06 MED ORDER — MONTELUKAST SODIUM 10 MG PO TABS
10.0000 mg | ORAL_TABLET | Freq: Every day | ORAL | 2 refills | Status: DC
  Filled 2023-09-06: qty 30, 30d supply, fill #0

## 2023-09-06 MED ORDER — ALBUTEROL SULFATE (2.5 MG/3ML) 0.083% IN NEBU
2.5000 mg | INHALATION_SOLUTION | RESPIRATORY_TRACT | 2 refills | Status: DC | PRN
  Filled 2023-09-06: qty 75, 5d supply, fill #0

## 2023-09-06 MED ORDER — ALBUTEROL SULFATE HFA 108 (90 BASE) MCG/ACT IN AERS
2.0000 | INHALATION_SPRAY | RESPIRATORY_TRACT | 2 refills | Status: DC | PRN
  Filled 2023-09-06: qty 6.7, 16d supply, fill #0

## 2023-09-06 MED ORDER — PREDNISONE 20 MG PO TABS
60.0000 mg | ORAL_TABLET | Freq: Once | ORAL | Status: AC
  Administered 2023-09-06: 60 mg via ORAL
  Filled 2023-09-06: qty 3

## 2023-09-06 NOTE — ED Triage Notes (Signed)
 Pt comes in via pov with complaints of SOB and dizziness that started last night. Pt with a history of asthma, and used the last puff of his inhaler this morning with no relief. Pt wheezing on presentation. Pt complains of pain on his right side that he equates to shingles. Pt complains 8/10 at this time.

## 2023-09-06 NOTE — ED Provider Notes (Signed)
 [facility] Provider Note    Event Date/Time   First MD Initiated Contact with Patient 09/06/23 1640     (approximate)   History   Shortness of Breath   HPI  Justin Reid is a 43 y.o. male with PMH of obesity hypoventilation syndrome, asthma, hypertension and shingles who presents for evaluation of shortness of breath.  Patient states that his symptoms began last night.  He reports he used the loss of his albuterol  inhaler this morning without any relief.  Patient has not had any cough, congestion or fevers.  Patient reports he has had some itching and tingling sensation on the right side of his abdomen which he attributes to early shingles.  Patient states he has had shingles in the same location before.      Physical Exam   Triage Vital Signs: ED Triage Vitals  Encounter Vitals Group     BP 09/06/23 1536 (!) 153/108     Girls Systolic BP Percentile --      Girls Diastolic BP Percentile --      Boys Systolic BP Percentile --      Boys Diastolic BP Percentile --      Pulse Rate 09/06/23 1536 97     Resp 09/06/23 1536 20     Temp 09/06/23 1536 98.2 F (36.8 C)     Temp src --      SpO2 09/06/23 1536 94 %     Weight 09/06/23 1537 (!) 309 lb 15.5 oz (140.6 kg)     Height 09/06/23 1537 5' 10 (1.778 m)     Head Circumference --      Peak Flow --      Pain Score 09/06/23 1537 8     Pain Loc --      Pain Education --      Exclude from Growth Chart --     Most recent vital signs: Vitals:   09/06/23 1536  BP: (!) 153/108  Pulse: 97  Resp: 20  Temp: 98.2 F (36.8 C)  SpO2: 94%    General: Awake, no distress.  CV:  Good peripheral perfusion. RRR. Resp:  Normal effort. Expiratory wheeze Abd:  No distention. No lesions or redness on left side of abdomen Other:     ED Results / Procedures / Treatments   Labs (all labs ordered are listed, but only abnormal results are displayed) Labs Reviewed  BASIC METABOLIC PANEL WITH GFR -  Abnormal; Notable for the following components:      Result Value   Sodium 133 (*)    Glucose, Bld 103 (*)    BUN 21 (*)    Calcium  8.6 (*)    All other components within normal limits  CBC - Abnormal; Notable for the following components:   Platelets 470 (*)    All other components within normal limits  TROPONIN I (HIGH SENSITIVITY)  TROPONIN I (HIGH SENSITIVITY)     EKG  ED provider interpretation: Normal sinus rhythm  Vent. rate 88 BPM PR interval 188 ms QRS duration 94 ms QT/QTcB 372/450 ms P-R-T axes 77 -5 71   RADIOLOGY  Chest x-ray obtained, I interpreted the images as well as reviewed the radiologist report, which was negative for any acute cardiopulmonary abnormalities.   PROCEDURES:  Critical Care performed: No  Procedures   MEDICATIONS ORDERED IN ED: Medications  ipratropium-albuterol  (DUONEB) 0.5-2.5 (3) MG/3ML nebulizer solution 3 mL (3 mLs Nebulization Given 09/06/23 1704)  ipratropium-albuterol  (DUONEB) 0.5-2.5 (3)  MG/3ML nebulizer solution 3 mL (3 mLs Nebulization Given 09/06/23 1704)  ipratropium-albuterol  (DUONEB) 0.5-2.5 (3) MG/3ML nebulizer solution 3 mL (3 mLs Nebulization Given 09/06/23 1704)  predniSONE  (DELTASONE ) tablet 60 mg (60 mg Oral Given 09/06/23 1704)     IMPRESSION / MDM / ASSESSMENT AND PLAN / ED COURSE  I reviewed the triage vital signs and the nursing notes.                             43 year old male presents for evaluation of shortness of breath.  Blood pressure is elevated otherwise vital signs are stable.  Patient does appear a bit uncomfortable on exam.  Differential diagnosis includes, but is not limited to, asthma exacerbation, pneumonia, bronchitis, COPD, less likely pulmonary embolism and ACS.  Patient's presentation is most consistent with acute complicated illness / injury requiring diagnostic workup.  CBC shows slightly elevated platelets otherwise unremarkable.  BMP shows mild hyponatremia otherwise unremarkable.   Troponin is not elevated.  Chest x-ray is negative for acute abnormalities.  EKG shows sinus rhythm.  I evaluated the left side of patient's abdomen and do not see any lesions concerning for shingles at this time.  Will hold off on antiviral treatment for now.  Did discuss signs and symptoms to watch for and advised him to follow-up if this develops.  Patient has been seen numerous times by this emergency department for asthma exacerbations.  He reports that his symptoms are the same.  He states that he has been hospitalized for this in the past but does not feel that his symptoms are as severe this time.  Will treat with DuoNebs and prednisone  and reassess for improvement.  Very low suspicion for ACS given normal troponin and EKG without ST changes.  Do not suspect pulmonary embolism as patient does not have any pain with deep breaths and he has wheezing on exam.  He reports that this is consistent with previous asthma exacerbations.  Has not had a cough or fever consistent with pneumonia or bronchitis and chest x-ray is clear.  Clinical Course as of 09/06/23 1811  Tue Sep 06, 2023  1804 Patient reassessed after receiving the DuoNebs and prednisone .  He reports symptomatically he feels much better.  On lung auscultation while there is still some wheezing present does appear to be improved and patient appears to be moving more air with each breath. [LD]  1804 Given his clinical improvement do not feel that he meets criteria for hospital admission.  Will send refills of patient's albuterol  inhaler and nebulizer solution.  Will also send a prescription for a few days of prednisone .  Advised him to follow-up with his primary care provider who can refer to pulmonology if they feel necessary.  Patient was given a note for work.  We reviewed return precautions.  He voiced understanding, all questions were answered and he was stable at discharge. [LD]    Clinical Course User Index [LD] Cleaster Tinnie LABOR, PA-C     FINAL CLINICAL IMPRESSION(S) / ED DIAGNOSES   Final diagnoses:  Exacerbation of asthma, unspecified asthma severity, unspecified whether persistent     Rx / DC Orders   ED Discharge Orders          Ordered    montelukast  (SINGULAIR ) 10 MG tablet  Daily        09/06/23 1811    albuterol  (PROVENTIL ) (2.5 MG/3ML) 0.083% nebulizer solution  Every 4 hours PRN  09/06/23 1811    albuterol  (VENTOLIN  HFA) 108 (90 Base) MCG/ACT inhaler  Every 4 hours PRN        09/06/23 1811             Note:  This document was prepared using Dragon voice recognition software and may include unintentional dictation errors.   Cleaster Tinnie LABOR, PA-C 09/06/23 1813    Waymond Lorelle Cummins, MD 09/06/23 [phone]

## 2023-09-06 NOTE — Discharge Instructions (Addendum)
 I believe your shortness of breath was the result of an asthma exacerbation today.  Your blood work, chest x-ray and EKG were normal today.  I have sent refills of the albuterol  inhaler as well as the albuterol  nebulizer solution.  I also sent a refill of montelukast  which is a daily allergy medication to help prevent asthma symptoms.  You can also take a daily nondrowsy allergy medication like Allegra, Claritin  or Zyrtec.  Please follow-up with your primary care provider.  If you do not have 1 I have attached a list of options in the area.  They can help coordinate follow-up with the pulmonologist if they feel this is necessary.    On exam you did not have signs and symptoms of shingles at this time, so you did not receive any medication for this specifically.  This may be something that will develop in a few days.  Watch for lesions to appear across your abdomen.  If this occurs please be reevaluated by a healthcare provider.  Return to the emergency department with any worsening symptoms.

## 2023-09-07 ENCOUNTER — Other Ambulatory Visit: Payer: Self-pay

## 2023-09-12 ENCOUNTER — Other Ambulatory Visit: Payer: Self-pay

## 2023-09-12 ENCOUNTER — Emergency Department: Payer: MEDICAID

## 2023-09-12 ENCOUNTER — Emergency Department
Admission: EM | Admit: 2023-09-12 | Discharge: 2023-09-12 | Disposition: A | Discharge status: Home / Self Care | Payer: MEDICAID

## 2023-09-12 ENCOUNTER — Encounter: Payer: Self-pay | Admitting: Emergency Medicine

## 2023-09-12 DIAGNOSIS — I10 Essential (primary) hypertension: Secondary | ICD-10-CM | POA: Diagnosis not present

## 2023-09-12 DIAGNOSIS — M1712 Unilateral primary osteoarthritis, left knee: Secondary | ICD-10-CM | POA: Diagnosis not present

## 2023-09-12 DIAGNOSIS — S80912A Unspecified superficial injury of left knee, initial encounter: Secondary | ICD-10-CM | POA: Diagnosis not present

## 2023-09-12 DIAGNOSIS — M1732 Unilateral post-traumatic osteoarthritis, left knee: Secondary | ICD-10-CM | POA: Insufficient documentation

## 2023-09-12 DIAGNOSIS — W07XXXA Fall from chair, initial encounter: Secondary | ICD-10-CM | POA: Insufficient documentation

## 2023-09-12 DIAGNOSIS — J45909 Unspecified asthma, uncomplicated: Secondary | ICD-10-CM | POA: Diagnosis not present

## 2023-09-12 DIAGNOSIS — S8992XA Unspecified injury of left lower leg, initial encounter: Secondary | ICD-10-CM | POA: Diagnosis present

## 2023-09-12 MED ORDER — OXYCODONE-ACETAMINOPHEN 5-325 MG PO TABS
1.0000 | ORAL_TABLET | Freq: Once | ORAL | Status: AC
  Administered 2023-09-12: 1 via ORAL
  Filled 2023-09-12: qty 1

## 2023-09-12 NOTE — ED Notes (Signed)
 Pt states that they fell last night and both of their knees hurt. Pt was having issues with their knees for quite a while and pt states that he was sitting on the side of the bed and dozed off while trying to muster the energy to get up to use the bathroom. Pain is 10/10 when they get up, pain is a 5/10 when they are just laying there. Pt states that they are tender and a bit swollen in the knees today.

## 2023-09-12 NOTE — Discharge Instructions (Addendum)
 You were seen in the emergency department for left knee injury with arthritis of the medial aspect of your knee.  Please continue Tylenol  and meloxicam  use at home.  You may also apply Biofreeze to the area where it gives you pain.  I have provided you with a follow-up for orthopedics (Dr. Hooten) for further management of arthritis.  Keep your appointment with your new primary care provider.  Return to the emergency department for any new, worsening or concerning symptoms.

## 2023-09-12 NOTE — ED Triage Notes (Signed)
 PT complains of chronic arthritis in both knees. Last night had difficulty standing and fell froward landing on left knee. Pt states unable to put weight on left leg today.

## 2023-09-12 NOTE — ED Provider Notes (Signed)
 [facility] Provider Note    Event Date/Time   First MD Initiated Contact with Patient 09/12/23 1659     (approximate)   History   Knee Pain   HPI  DERRAN SEAR is a 43 y.o. male  with a past medical history of obesity hypoventilation syndrome, hypertension, GERD, asthma presents to the emergency department with medial left knee pain after falling out of a chair last night.  Patient states he was asleep and fell forwards out of the chair and landed onto his left knee and lower leg.  Patient states he has been hopping around and has been unable to put weight on his left leg today.  Denies fever, chills, calf swelling, erythema or edema.  Patient has been using Tylenol  and Meloxicam  at home for his pain.    Physical Exam   Triage Vital Signs: ED Triage Vitals [09/12/23 1530]  Encounter Vitals Group     BP (!) 135/103     Girls Systolic BP Percentile      Girls Diastolic BP Percentile      Boys Systolic BP Percentile      Boys Diastolic BP Percentile      Pulse Rate 100     Resp 18     Temp 98.1 F (36.7 C)     Temp src      SpO2 98 %     Weight (!) 308 lb 10.3 oz (140 kg)     Height      Head Circumference      Peak Flow      Pain Score 10     Pain Loc      Pain Education      Exclude from Growth Chart     Most recent vital signs: Vitals:   09/12/23 1530 09/12/23 1748  BP: (!) 135/103 (!) 141/95  Pulse: 100 92  Resp: 18 20  Temp: 98.1 F (36.7 C) 98.7 F (37.1 C)  SpO2: 98% 99%    General: Awake, in no acute distress. Appears stated age. Head: Normocephalic, atraumatic. CV: Good peripheral perfusion.  Respiratory:Normal respiratory effort.  No respiratory distress.  GI: Soft, non-distended. MSK: Normal ROM and  5/5 strength in b/l lower extremities. No laxity of MCL, LCL, ACL. McMurray's negative.  Tender to palpation along medial joint line of the left knee. Skin:Warm, dry, intact. No rashes, lesions, or ecchymosis.   Neurological: A&Ox4 to person, place, time, and situation. Sensation intact. Strength symmetric. No focal deficits.   ED Results / Procedures / Treatments   Labs (all labs ordered are listed, but only abnormal results are displayed) Labs Reviewed - No data to display   EKG     RADIOLOGY X ray left knee  FINDINGS: No acute fracture or dislocation.   No aggressive osseous lesion.   There are degenerative changes of the knee joint in the form of mildly reduced medial tibio-femoral compartment joint space, tibial spiking and tricompartmental osteophytosis.   No knee effusion or focal soft tissue swelling.   No radiopaque foreign bodies.   IMPRESSION: *No acute osseous abnormality of the left knee joint. Mild degenerative changes, as described above.   PROCEDURES:  Critical Care performed: No   Procedures   MEDICATIONS ORDERED IN ED: Medications  oxyCODONE -acetaminophen  (PERCOCET/ROXICET) 5-325 MG per tablet 1 tablet (has no administration in time range)     IMPRESSION / MDM / ASSESSMENT AND PLAN / ED COURSE  I reviewed the triage vital signs and the  nursing notes.                              Differential diagnosis includes, but is not limited to, osteoarthritis of left knee, ligamentous injury, patellar fracture  Patient's presentation is most consistent with acute complicated illness / injury requiring diagnostic workup.  Patient is a 43 year old male presenting today with left knee pain following injury.  X-ray ordered and shows arthritis of the medial compartment of the left knee. I independently viewed the x-ray and radiologist's report.  I agree with the radiologist's report.  Since patient has not been unable to weight-bear today, I did provide him with crutches and a knee brace.  Discussed OTC pain medication use at home.  He does have a appointment with a new PCP at the end of this month.  Also gave follow-up information with orthopedics to see if he is  a candidate for steroid or lubricating injections if his knee pain does not resolve.   The patient may return to the emergency department for any new, worsening, or concerning symptoms. Patient was given the opportunity to ask questions; all questions were answered. Emergency department return precautions were discussed with the patient.  Patient is in agreement to the treatment plan.  Patient is stable for discharge.   FINAL CLINICAL IMPRESSION(S) / ED DIAGNOSES   Final diagnoses:  Left knee injury, initial encounter  Post-traumatic osteoarthritis of left knee     Rx / DC Orders   ED Discharge Orders     None        Note:  This document was prepared using Dragon voice recognition software and may include unintentional dictation errors.     Sheron Salm, PA-C 09/12/23 1758    Clarine Ozell LABOR, MD 09/13/23 [phone]

## 2023-09-19 ENCOUNTER — Other Ambulatory Visit: Payer: Self-pay

## 2023-09-19 ENCOUNTER — Encounter: Payer: Self-pay | Admitting: Emergency Medicine

## 2023-09-19 ENCOUNTER — Emergency Department
Admission: EM | Admit: 2023-09-19 | Discharge: 2023-09-19 | Disposition: A | Discharge status: Home / Self Care | Payer: Self-pay | Attending: Emergency Medicine | Admitting: Emergency Medicine

## 2023-09-19 DIAGNOSIS — M25561 Pain in right knee: Secondary | ICD-10-CM | POA: Insufficient documentation

## 2023-09-19 DIAGNOSIS — I10 Essential (primary) hypertension: Secondary | ICD-10-CM | POA: Insufficient documentation

## 2023-09-19 DIAGNOSIS — M25562 Pain in left knee: Secondary | ICD-10-CM | POA: Insufficient documentation

## 2023-09-19 MED ORDER — DEXAMETHASONE SODIUM PHOSPHATE 10 MG/ML IJ SOLN
10.0000 mg | Freq: Once | INTRAMUSCULAR | Status: AC
  Administered 2023-09-19: 10 mg via INTRAMUSCULAR
  Filled 2023-09-19: qty 1

## 2023-09-19 MED ORDER — OXYCODONE-ACETAMINOPHEN 5-325 MG PO TABS
1.0000 | ORAL_TABLET | ORAL | 0 refills | Status: DC | PRN
  Filled 2023-09-19: qty 8, 2d supply, fill #0

## 2023-09-19 NOTE — ED Triage Notes (Signed)
 C/O bilateral knee pain. Denies injury. Symptoms have been ongoing for a while.

## 2023-09-19 NOTE — ED Provider Notes (Signed)
[facility] Provider Note    Event Date/Time   First MD Initiated Contact with Patient 09/19/23 [id number]     (approximate)  History   Chief Complaint: Knee Pain  HPI  NETANEL YANNUZZI is a 43 y.o. male with a past medical history of arthritis, hypertension, obesity, presents to the emergency department for bilateral knee pain.  According to the patient he has a history of chronic knee pain mostly affects his left knee but more recently has been affecting his right knee as well.  Patient has a known history of arthritis.  Patient states he tried to get an appointment with orthopedics but they wanted a $600 copayment which the patient cannot afford as he recently lost his job.  Patient denies any trauma.  No fever.  Patient states he has had gout previously.  Physical Exam   Triage Vital Signs: ED Triage Vitals  Encounter Vitals Group     BP 09/19/23 0712 (!) 152/109     Girls Systolic BP Percentile --      Girls Diastolic BP Percentile --      Boys Systolic BP Percentile --      Boys Diastolic BP Percentile --      Pulse Rate 09/19/23 0712 85     Resp 09/19/23 0712 20     Temp --      Temp Source 09/19/23 0712 Oral     SpO2 09/19/23 0712 96 %     Weight 09/19/23 0711 (!) 308 lb 10.3 oz (140 kg)     Height --      Head Circumference --      Peak Flow --      Pain Score 09/19/23 0710 10     Pain Loc --      Pain Education --      Exclude from Growth Chart --     Most recent vital signs: Vitals:   09/19/23 0712  BP: (!) 152/109  Pulse: 85  Resp: 20  SpO2: 96%    General: Awake, no distress.  CV:  Good peripheral perfusion. Resp:  Normal effort.  Other:  No palpable joint effusions.  Good range of motion in the knees.  No erythema noted.   ED Results / Procedures / Treatments   MEDICATIONS ORDERED IN ED: Medications  dexamethasone  (DECADRON ) injection 10 mg (has no administration in time range)     IMPRESSION / MDM / ASSESSMENT AND PLAN /  ED COURSE  I reviewed the triage vital signs and the nursing notes.  Patient's presentation is most consistent with acute illness / injury with system symptoms.  Patient presents to the emergency department for acute on chronic knee pain.  Patient has a long history of the same.  Known history of arthritis he has also had gout previously.  No obvious effusion no erythema.  Good range of motion patient was able to ambulate back to the room.  Discussed with the patient the need to follow-up with orthopedics, he understands and is attempting to do so.  Will prescribe a short course of pain medication for the patient.  Given his history of gout as well as arthritis we will dose a one-time IM dose of Decadron  in the emergency department.  Patient agreeable to plan.  FINAL CLINICAL IMPRESSION(S) / ED DIAGNOSES   Bilateral knee pain   Note:  This document was prepared using Dragon voice recognition software and may include unintentional dictation errors.   Dorothyann Drivers, MD 09/19/23  0736  

## 2023-09-27 ENCOUNTER — Emergency Department: Payer: Self-pay

## 2023-09-27 ENCOUNTER — Inpatient Hospital Stay
Admission: EM | Admit: 2023-09-27 | Discharge: 2023-09-30 | DRG: 189 | Disposition: A | Discharge status: Home / Self Care | Payer: MEDICAID | Attending: Internal Medicine | Admitting: Internal Medicine

## 2023-09-27 ENCOUNTER — Other Ambulatory Visit: Payer: Self-pay

## 2023-09-27 DIAGNOSIS — J4551 Severe persistent asthma with (acute) exacerbation: Secondary | ICD-10-CM | POA: Diagnosis not present

## 2023-09-27 DIAGNOSIS — Z8249 Family history of ischemic heart disease and other diseases of the circulatory system: Secondary | ICD-10-CM

## 2023-09-27 DIAGNOSIS — J45902 Unspecified asthma with status asthmaticus: Secondary | ICD-10-CM | POA: Diagnosis present

## 2023-09-27 DIAGNOSIS — Z87891 Personal history of nicotine dependence: Secondary | ICD-10-CM

## 2023-09-27 DIAGNOSIS — J45901 Unspecified asthma with (acute) exacerbation: Principal | ICD-10-CM

## 2023-09-27 DIAGNOSIS — E66813 Obesity, class 3: Secondary | ICD-10-CM | POA: Diagnosis present

## 2023-09-27 DIAGNOSIS — R0602 Shortness of breath: Secondary | ICD-10-CM | POA: Diagnosis not present

## 2023-09-27 DIAGNOSIS — E662 Morbid (severe) obesity with alveolar hypoventilation: Secondary | ICD-10-CM | POA: Diagnosis present

## 2023-09-27 DIAGNOSIS — J4552 Severe persistent asthma with status asthmaticus: Secondary | ICD-10-CM | POA: Diagnosis not present

## 2023-09-27 DIAGNOSIS — Z6841 Body Mass Index (BMI) 40.0 and over, adult: Secondary | ICD-10-CM

## 2023-09-27 DIAGNOSIS — J9601 Acute respiratory failure with hypoxia: Principal | ICD-10-CM | POA: Diagnosis present

## 2023-09-27 DIAGNOSIS — Z79899 Other long term (current) drug therapy: Secondary | ICD-10-CM

## 2023-09-27 DIAGNOSIS — Z791 Long term (current) use of non-steroidal anti-inflammatories (NSAID): Secondary | ICD-10-CM

## 2023-09-27 DIAGNOSIS — I10 Essential (primary) hypertension: Secondary | ICD-10-CM | POA: Diagnosis present

## 2023-09-27 DIAGNOSIS — Z1152 Encounter for screening for COVID-19: Secondary | ICD-10-CM

## 2023-09-27 DIAGNOSIS — K219 Gastro-esophageal reflux disease without esophagitis: Secondary | ICD-10-CM | POA: Diagnosis present

## 2023-09-27 DIAGNOSIS — R0789 Other chest pain: Secondary | ICD-10-CM | POA: Diagnosis present

## 2023-09-27 LAB — CBC
HCT: 40.1 % (ref 39.0–52.0)
Hemoglobin: 12.7 g/dL — ABNORMAL LOW (ref 13.0–17.0)
MCH: 27 pg (ref 26.0–34.0)
MCHC: 31.7 g/dL (ref 30.0–36.0)
MCV: 85.3 fL (ref 80.0–100.0)
Platelets: 420 K/uL — ABNORMAL HIGH (ref 150–400)
RBC: 4.7 MIL/uL (ref 4.22–5.81)
RDW: 14.8 % (ref 11.5–15.5)
WBC: 8.8 K/uL (ref 4.0–10.5)
nRBC: 0 % (ref 0.0–0.2)

## 2023-09-27 LAB — BLOOD GAS, VENOUS
Acid-Base Excess: 2.5 mmol/L — ABNORMAL HIGH (ref 0.0–2.0)
Bicarbonate: 26.3 mmol/L (ref 20.0–28.0)
O2 Saturation: 91.4 %
Patient temperature: 37
pCO2, Ven: 37 mmHg — ABNORMAL LOW (ref 44–60)
pH, Ven: 7.46 — ABNORMAL HIGH (ref 7.25–7.43)
pO2, Ven: 56 mmHg — ABNORMAL HIGH (ref 32–45)

## 2023-09-27 LAB — RESPIRATORY PANEL BY PCR

## 2023-09-27 LAB — BASIC METABOLIC PANEL WITH GFR
Anion gap: 9 (ref 5–15)
BUN: 18 mg/dL (ref 6–20)
CO2: 22 mmol/L (ref 22–32)
Calcium: 8.6 mg/dL — ABNORMAL LOW (ref 8.9–10.3)
Chloride: 105 mmol/L (ref 98–111)
Creatinine, Ser: 0.77 mg/dL (ref 0.61–1.24)
GFR, Estimated: >60 mL/min (ref >60)
Glucose, Bld: 99 mg/dL (ref 70–99)
Potassium: 3.7 mmol/L (ref 3.5–5.1)
Sodium: 136 mmol/L (ref 135–145)

## 2023-09-27 LAB — BLOOD GAS, ARTERIAL
Acid-Base Excess: 0.3 mmol/L (ref 0.0–2.0)
Bicarbonate: 25.4 mmol/L (ref 20.0–28.0)
O2 Saturation: 99.5 %
Patient temperature: 37
pCO2 arterial: 42 mmHg (ref 32–48)
pH, Arterial: 7.39 (ref 7.35–7.45)
pO2, Arterial: 138 mmHg — ABNORMAL HIGH (ref 83–108)

## 2023-09-27 LAB — RESP PANEL BY RT-PCR (RSV, FLU A&B, COVID)  RVPGX2
Influenza A by PCR: NEGATIVE
Influenza B by PCR: NEGATIVE
Resp Syncytial Virus by PCR: NEGATIVE
SARS Coronavirus 2 by RT PCR: NEGATIVE

## 2023-09-27 LAB — MRSA NEXT GEN BY PCR, NASAL: MRSA by PCR Next Gen: NOT DETECTED

## 2023-09-27 LAB — GLUCOSE, CAPILLARY: Glucose-Capillary: 140 mg/dL — ABNORMAL HIGH (ref 70–99)

## 2023-09-27 MED ORDER — ALBUTEROL SULFATE (2.5 MG/3ML) 0.083% IN NEBU: 10.0000 mg | INHALATION_SOLUTION | RESPIRATORY_TRACT | Status: DC

## 2023-09-27 MED ORDER — ALBUTEROL SULFATE (2.5 MG/3ML) 0.083% IN NEBU
2.5000 mg | INHALATION_SOLUTION | Freq: Four times a day (QID) | RESPIRATORY_TRACT | Status: DC | PRN
  Administered 2023-09-27 – 2023-09-30 (×4): 2.5 mg via RESPIRATORY_TRACT
  Filled 2023-09-27 (×4): qty 3

## 2023-09-27 MED ORDER — ALBUTEROL SULFATE (2.5 MG/3ML) 0.083% IN NEBU: 2.5000 mg | INHALATION_SOLUTION | RESPIRATORY_TRACT | Status: DC

## 2023-09-27 MED ORDER — METHYLPREDNISOLONE SODIUM SUCC 125 MG IJ SOLR
60.0000 mg | INTRAMUSCULAR | Status: DC
  Administered 2023-09-28: 60 mg via INTRAVENOUS
  Filled 2023-09-27: qty 2

## 2023-09-27 MED ORDER — HYDRALAZINE HCL 20 MG/ML IJ SOLN
10.0000 mg | Freq: Four times a day (QID) | INTRAMUSCULAR | Status: DC | PRN
  Administered 2023-09-27: 10 mg via INTRAVENOUS
  Filled 2023-09-27: qty 1

## 2023-09-27 MED ORDER — SODIUM CHLORIDE 0.9 % IV SOLN
500.0000 mg | INTRAVENOUS | Status: DC
  Administered 2023-09-27 – 2023-09-29 (×3): 500 mg via INTRAVENOUS
  Filled 2023-09-27 (×4): qty 5

## 2023-09-27 MED ORDER — IPRATROPIUM-ALBUTEROL 0.5-2.5 (3) MG/3ML IN SOLN: 3.0000 mL | RESPIRATORY_TRACT | Status: DC

## 2023-09-27 MED ORDER — FUROSEMIDE 40 MG PO TABS
40.0000 mg | ORAL_TABLET | Freq: Every day | ORAL | Status: DC
  Administered 2023-09-28 – 2023-09-30 (×3): 40 mg via ORAL
  Filled 2023-09-27: qty 2
  Filled 2023-09-27 (×3): qty 1

## 2023-09-27 MED ORDER — FLUTICASONE FUROATE-VILANTEROL 200-25 MCG/ACT IN AEPB
1.0000 | INHALATION_SPRAY | Freq: Every day | RESPIRATORY_TRACT | Status: DC
Start: 2023-09-27 — End: 2023-09-30
  Administered 2023-09-27 – 2023-09-30 (×4): 1 via RESPIRATORY_TRACT
  Filled 2023-09-27 (×2): qty 28

## 2023-09-27 MED ORDER — GUAIFENESIN-DM 100-10 MG/5ML PO SYRP
5.0000 mL | ORAL_SOLUTION | ORAL | Status: DC | PRN
  Administered 2023-09-27 – 2023-09-29 (×5): 5 mL via ORAL
  Filled 2023-09-27 (×5): qty 10

## 2023-09-27 MED ORDER — IPRATROPIUM-ALBUTEROL 0.5-2.5 (3) MG/3ML IN SOLN
3.0000 mL | Freq: Once | RESPIRATORY_TRACT | Status: AC
  Administered 2023-09-27: 3 mL via RESPIRATORY_TRACT

## 2023-09-27 MED ORDER — ACETAMINOPHEN 325 MG PO TABS: 650.0000 mg | ORAL_TABLET | Freq: Four times a day (QID) | ORAL | Status: DC | PRN

## 2023-09-27 MED ORDER — DIPHENHYDRAMINE HCL 25 MG PO CAPS
50.0000 mg | ORAL_CAPSULE | Freq: Every evening | ORAL | Status: DC | PRN
  Administered 2023-09-27 – 2023-09-29 (×3): 50 mg via ORAL
  Filled 2023-09-27: qty 2
  Filled 2023-09-27: qty 1
  Filled 2023-09-27 (×4): qty 2

## 2023-09-27 MED ORDER — ALBUTEROL (5 MG/ML) CONTINUOUS INHALATION SOLN
10.0000 mg/h | INHALATION_SOLUTION | Freq: Once | RESPIRATORY_TRACT | Status: DC
  Filled 2023-09-27: qty 20

## 2023-09-27 MED ORDER — ENOXAPARIN SODIUM 80 MG/0.8ML IJ SOSY
0.5000 mg/kg | PREFILLED_SYRINGE | INTRAMUSCULAR | Status: DC
  Administered 2023-09-27: 67.5 mg via SUBCUTANEOUS
  Filled 2023-09-27: qty 0.8

## 2023-09-27 MED ORDER — MAGNESIUM SULFATE 2 GM/50ML IV SOLN
2.0000 g | Freq: Once | INTRAVENOUS | Status: AC
  Administered 2023-09-27: 2 g via INTRAVENOUS
  Filled 2023-09-27: qty 50

## 2023-09-27 MED ORDER — MONTELUKAST SODIUM 10 MG PO TABS
10.0000 mg | ORAL_TABLET | Freq: Every day | ORAL | Status: DC
  Administered 2023-09-27 – 2023-09-30 (×4): 10 mg via ORAL
  Filled 2023-09-27 (×4): qty 1

## 2023-09-27 MED ORDER — PANTOPRAZOLE SODIUM 40 MG PO TBEC
40.0000 mg | DELAYED_RELEASE_TABLET | Freq: Every day | ORAL | Status: DC
  Administered 2023-09-27 – 2023-09-30 (×4): 40 mg via ORAL
  Filled 2023-09-27 (×4): qty 1

## 2023-09-27 MED ORDER — CHLORHEXIDINE GLUCONATE CLOTH 2 % EX PADS
6.0000 | MEDICATED_PAD | Freq: Every day | CUTANEOUS | Status: DC
  Administered 2023-09-27 – 2023-09-28 (×2): 6 via TOPICAL

## 2023-09-27 MED ORDER — LORATADINE 10 MG PO TABS
10.0000 mg | ORAL_TABLET | Freq: Every day | ORAL | Status: DC
  Administered 2023-09-27 – 2023-09-30 (×4): 10 mg via ORAL
  Filled 2023-09-27 (×4): qty 1

## 2023-09-27 MED ORDER — METOPROLOL SUCCINATE ER 50 MG PO TB24
50.0000 mg | ORAL_TABLET | Freq: Every day | ORAL | Status: DC
  Administered 2023-09-27 – 2023-09-30 (×4): 50 mg via ORAL
  Filled 2023-09-27 (×4): qty 1

## 2023-09-27 MED ORDER — ENOXAPARIN SODIUM 40 MG/0.4ML IJ SOSY
40.0000 mg | PREFILLED_SYRINGE | INTRAMUSCULAR | Status: DC
Start: 2023-09-27 — End: 2023-09-27

## 2023-09-27 MED ORDER — OXYCODONE HCL 5 MG PO TABS
5.0000 mg | ORAL_TABLET | Freq: Four times a day (QID) | ORAL | Status: DC | PRN
  Administered 2023-09-27 – 2023-09-30 (×8): 5 mg via ORAL
  Filled 2023-09-27 (×9): qty 1

## 2023-09-27 MED ORDER — METHYLPREDNISOLONE SODIUM SUCC 125 MG IJ SOLR
125.0000 mg | Freq: Once | INTRAMUSCULAR | Status: AC
Start: 2023-09-27 — End: 2023-09-27
  Administered 2023-09-27: 125 mg via INTRAVENOUS
  Filled 2023-09-27: qty 2

## 2023-09-27 MED ORDER — IPRATROPIUM-ALBUTEROL 0.5-2.5 (3) MG/3ML IN SOLN
RESPIRATORY_TRACT | Status: AC
  Filled 2023-09-27: qty 3

## 2023-09-27 MED ORDER — IPRATROPIUM-ALBUTEROL 0.5-2.5 (3) MG/3ML IN SOLN
3.0000 mL | Freq: Three times a day (TID) | RESPIRATORY_TRACT | Status: DC
  Administered 2023-09-27 – 2023-09-29 (×7): 3 mL via RESPIRATORY_TRACT
  Filled 2023-09-27 (×8): qty 3

## 2023-09-27 MED ORDER — ALBUTEROL SULFATE (2.5 MG/3ML) 0.083% IN NEBU
10.0000 mg | INHALATION_SOLUTION | Freq: Once | RESPIRATORY_TRACT | Status: AC
Start: 2023-09-27 — End: 2023-09-27
  Administered 2023-09-27: 10 mg via RESPIRATORY_TRACT

## 2023-09-27 MED ORDER — IPRATROPIUM-ALBUTEROL 0.5-2.5 (3) MG/3ML IN SOLN
3.0000 mL | Freq: Four times a day (QID) | RESPIRATORY_TRACT | Status: DC
  Administered 2023-09-27: 3 mL via RESPIRATORY_TRACT
  Filled 2023-09-27: qty 3

## 2023-09-27 MED ORDER — IPRATROPIUM-ALBUTEROL 0.5-2.5 (3) MG/3ML IN SOLN
3.0000 mL | Freq: Once | RESPIRATORY_TRACT | Status: AC
  Administered 2023-09-27: 3 mL via RESPIRATORY_TRACT
  Filled 2023-09-27: qty 3

## 2023-09-27 NOTE — H&P (Signed)
 History and Physical    Justin Reid FMW:[id number] DOB: 1980/07/08 DOA: 09/27/2023  PCP: Pcp, No (Confirm with patient/family/NH records and if not entered, this has to be entered at [facility] point of entry) Patient coming from: Home  I have personally briefly reviewed patient's old medical records in [facility]  Chief Complaint: Cough wheezing shortness of breath  HPI: Justin Reid is a 43 y.o. male with medical history significant of moderate asthma, morbid obesity, HTN, presented with worsening of cough wheezing shortness of breath.  Symptoms started yesterday evening, quickly deteriorated overnight could not sleep despite using around-the-clock albuterol .  Unable to lie down at all this morning and had to sit up sitting forward in tripod position.  Denied any fever or chills.  ED Course: Afebrile, tachycardia blood pressure elevated 150/100 ultrafiltration 90% on room air, very tachypneic plan patient was placed on BiPAP.  Chest x-ray negative for acute infiltrates.  WBC 8.8 hemoglobin 12 BUN 19 creatinine 0.5.  Initial VBG showed 7.4 6/37/56, repeat ABG 2 hours showed 7.3 9/42/138.  Patient was given IV Solu-Medrol  magnesium  and multiple rounds of bronchodilator but remained symptomatic.  Review of Systems: As per HPI otherwise 14 point review of systems negative.    Past Medical History:  Diagnosis Date   2019-nCoV vaccination declined 11/10/2020   Arthritis    Asthma    Hypertension    Morbid obesity (HCC)    Shingles    Tobacco use     History reviewed. No pertinent surgical history.   reports that he has quit smoking. His smoking use included cigarettes. He has a 2.5 pack-year smoking history. He has never used smokeless tobacco. He reports that he does not currently use alcohol. He reports that he does not use drugs.  No Known Allergies  Family History  Problem Relation Age of Onset   Hypertension Father    Hypertension Maternal Grandmother      Prior  to Admission medications   Medication Sig Start Date End Date Taking? Authorizing Provider  acetaminophen  (TYLENOL ) 325 MG tablet Take 2 tablets (650 mg total) by mouth every 6 (six) hours as needed for mild pain (pain score 1-3) or fever. 04/06/23   Alexander, Natalie, DO  albuterol  (PROVENTIL ) (2.5 MG/3ML) 0.083% nebulizer solution Take 3 mLs (2.5 mg total) by nebulization every 4 (four) hours as needed for wheezing or shortness of breath. 09/06/23 09/05/24  Cleaster Tinnie LABOR, PA-C  albuterol  (VENTOLIN  HFA) 108 (90 Base) MCG/ACT inhaler Inhale 2 puffs into the lungs every 6 (six) hours as needed for wheezing or shortness of breath. 06/16/23   Margrette, Myah A, PA-C  albuterol  (VENTOLIN  HFA) 108 (90 Base) MCG/ACT inhaler Inhale 2 puffs into the lungs every 4 (four) hours as needed for wheezing or shortness of breath. 09/06/23   Cleaster Tinnie LABOR, PA-C  cephALEXin  (KEFLEX ) 500 MG capsule Take 1 capsule (500 mg total) by mouth 3 (three) times daily. 07/11/23   Saunders Shona CROME, PA-C  fluticasone -salmeterol (WIXELA INHUB ) 250-50 MCG/ACT AEPB Inhale 1 puff into the lungs in the morning and at bedtime. 05/16/23 07/15/23  Maree Hue, MD  furosemide  (LASIX ) 40 MG tablet Take 1 tablet (40 mg total) by mouth daily. 05/16/23 06/15/23  Maree Hue, MD  ipratropium-albuterol  (DUONEB) 0.5-2.5 (3) MG/3ML SOLN Take 3 mLs by nebulization every 6 (six) hours as needed. 05/16/23 06/15/23  Maree Hue, MD  loratadine  (CLARITIN ) 10 MG tablet Take 1 tablet (10 mg total) by mouth daily. 04/06/23   Marsa,  Natalie, DO  meloxicam  (MOBIC ) 15 MG tablet Take 1 tablet (15 mg total) by mouth daily. 07/25/23 07/24/24  Bradler, Evan K, MD  metoprolol  succinate (TOPROL -XL) 50 MG 24 hr tablet Take 1 tablet (50 mg total) by mouth daily. Take with or immediately following a meal. 04/22/23 06/21/23  Jhonny Sahara B, MD  montelukast  (SINGULAIR ) 10 MG tablet Take 1 tablet (10 mg total) by mouth daily. 09/06/23 12/05/23  Cleaster Tinnie LABOR, PA-C   oxyCODONE  (ROXICODONE ) 5 MG immediate release tablet Take 1 tablet (5 mg total) by mouth every 6 (six) hours as needed for severe pain (pain score 7-10). 05/31/23   Josette Ade, MD  oxyCODONE -acetaminophen  (PERCOCET) 5-325 MG tablet Take 1 tablet by mouth every 4 (four) hours as needed for severe pain (pain score 7-10). 09/19/23   Dorothyann Drivers, MD  pantoprazole  (PROTONIX ) 40 MG tablet Take 1 tablet (40 mg total) by mouth daily. 05/16/23 07/15/23  Maree Hue, MD    Physical Exam: Vitals:   09/27/23 1400 09/27/23 1436 09/27/23 1500 09/27/23 1520  BP: (!) 152/91  (!) 168/119 (!) 150/102  Pulse: 85  90 85  Resp: (!) 24  14 15   Temp:  98.4 F (36.9 C)    TempSrc:  Axillary    SpO2: 99%  100% 92%  Weight:      Height:        Constitutional: NAD, calm, comfortable Vitals:   09/27/23 1400 09/27/23 1436 09/27/23 1500 09/27/23 1520  BP: (!) 152/91  (!) 168/119 (!) 150/102  Pulse: 85  90 85  Resp: (!) 24  14 15   Temp:  98.4 F (36.9 C)    TempSrc:  Axillary    SpO2: 99%  100% 92%  Weight:      Height:       Eyes: PERRL, lids and conjunctivae normal ENMT: Mucous membranes are moist. Posterior pharynx clear of any exudate or lesions.Normal dentition.  Neck: normal, supple, no masses, no thyromegaly Respiratory: Diminished breathing sound bilaterally, scattered wheezing no crackles, increasing respiratory effort.  Positive signs of accessory muscle use.  Cardiovascular: Regular rate and rhythm, no murmurs / rubs / gallops. No extremity edema. 2+ pedal pulses. No carotid bruits.  Abdomen: no tenderness, no masses palpated. No hepatosplenomegaly. Bowel sounds positive.  Musculoskeletal: no clubbing / cyanosis. No joint deformity upper and lower extremities. Good ROM, no contractures. Normal muscle tone.  Skin: no rashes, lesions, ulcers. No induration Neurologic: CN 2-12 grossly intact. Sensation intact, DTR normal. Strength 5/5 in all 4.  Psychiatric: Lethargic but not  confused    Labs on Admission: I have personally reviewed following labs and imaging studies  CBC: Recent Labs  Lab 09/27/23 1051  WBC 8.8  HGB 12.7*  HCT 40.1  MCV 85.3  PLT 420*   Basic Metabolic Panel: Recent Labs  Lab 09/27/23 1051  NA 136  K 3.7  CL 105  CO2 22  GLUCOSE 99  BUN 18  CREATININE 0.77  CALCIUM  8.6*   GFR: Estimated Creatinine Clearance: 168.8 mL/min (by C-G formula based on SCr of 0.77 mg/dL). Liver Function Tests: No results for input(s): AST, ALT, ALKPHOS, BILITOT, PROT, ALBUMIN in the last 168 hours. No results for input(s): LIPASE, AMYLASE in the last 168 hours. No results for input(s): AMMONIA in the last 168 hours. Coagulation Profile: No results for input(s): INR, PROTIME in the last 168 hours. Cardiac Enzymes: No results for input(s): CKTOTAL, CKMB, CKMBINDEX, TROPONINI in the last 168 hours. BNP (last 3 results) No  results for input(s): PROBNP in the last 8760 hours. HbA1C: No results for input(s): HGBA1C in the last 72 hours. CBG: Recent Labs  Lab 09/27/23 1559  GLUCAP 140*   Lipid Profile: No results for input(s): CHOL, HDL, LDLCALC, TRIG, CHOLHDL, LDLDIRECT in the last 72 hours. Thyroid  Function Tests: No results for input(s): TSH, T4TOTAL, FREET4, T3FREE, THYROIDAB in the last 72 hours. Anemia Panel: No results for input(s): VITAMINB12, FOLATE, FERRITIN, TIBC, IRON, RETICCTPCT in the last 72 hours. Urine analysis: No results found for: COLORURINE, APPEARANCEUR, LABSPEC, PHURINE, GLUCOSEU, HGBUR, BILIRUBINUR, KETONESUR, PROTEINUR, UROBILINOGEN, NITRITE, LEUKOCYTESUR  Radiological Exams on Admission: DG Chest Portable 1 View Result Date: 09/27/2023 CLINICAL DATA:  asthma, asymmetric lung sounds since last xr EXAM: PORTABLE CHEST 1 VIEW COMPARISON:  Chest XR, 09/27/2023 at 1041 hours. CTA chest, 04/02/2023. FINDINGS: Cardiomediastinal  silhouette is within normal limits. Lungs are hypoinflated with minimal bibasilar linear opacities. No focal consolidation or mass. No pleural effusion or pneumothorax. No acute displaced fracture. IMPRESSION: Hypoinflation without acute superimposed cardiopulmonary process. Electronically Signed   By: Thom Hall M.D.   On: 09/27/2023 14:17   DG Chest Portable 1 View Result Date: 09/27/2023 CLINICAL DATA:  Wheezing and shortness of breath. EXAM: PORTABLE CHEST 1 VIEW COMPARISON:  09/06/2023.  Chest CTA dated 04/02/2023. FINDINGS: Poor inspiration. Grossly normal sized heart. Mild prominence of the pulmonary vasculature, accentuated by the poor inspiration. Clear lungs. Small calcified granuloma or bone island overlying the left mid lung zone laterally. Left glenohumeral degenerative changes and bursal calcified loose bodies. IMPRESSION: Poor inspiration with no acute abnormality. Electronically Signed   By: Elspeth Bathe M.D.   On: 09/27/2023 11:19    EKG: Independently reviewed.  Sinus rhythm, no acute ST changes.  Assessment/Plan Principal Problem:   Status asthmaticus Active Problems:   Acute severe exacerbation of severe persistent asthma  (please populate well all problems here in Problem List. (For example, if patient is on BP meds at home and you resume or decide to hold them, it is a problem that needs to be her. Same for CAD, COPD, HLD and so on)  Status asthmaticus Acute hypoxic respiratory failure History of moderate asthma - Given the risk of continued deterioration, plan to place patient in stepdown unit for continued BiPAP and close monitoring of breathing status and mental status. - Continue IV Solu-Medrol  - DuoNebs and as needed albuterol  - Continue ICS and LABA - Peak flow  Morbid obesity - BMI= 43 - Outpatient GLP-1 agonist therapy evaluation  DVT prophylaxis: Lovenox  Code Status: Full code Family Communication: None at bedside Disposition Plan: Expect less than 2  midnight hospital stay Consults called: ED consulted ICU earlier Admission status: Stepdown unit from observation   Cort ONEIDA Mana MD [facility] Pager [phone]  09/27/2023, 4:03 PM

## 2023-09-27 NOTE — Consult Note (Addendum)
 This is a case of 43 year old male patient with a past medical history of moderate to severe persistent asthma presenting to [facility] on 09/23 with acute asthma exacerbation requiring BiPAP support.  He reports being congested for the past couple of days which has progressed to wheezing chest tightness and significant shortness of breath.  In the ED was noted to be in moderate respiratory distress requiring systemic steroids IV mag times two 1 hour albuterol  neb and BiPAP support.  Pulmonary team is being consulted for help with further management.  He had multiple exacerbations in the past year with multiple presentations to hospital.  But he never required intubation.  Chest x-ray with hypoinflation.  On my encounter, patient reports feeling slightly better but still uncomfortable.  Physical exam GEN mild respiratory distress HEENT supple neck, reactive pupils, EOMI CVS tachycardic regular rhythm Lungs diffuse expiratory wheezing poor air movement of the the right hemithorax Abdomen soft nontender nondistended obese Extremities warm well-perfused no edema.  Labs and imaging were reviewed.   A&P This is a case of 43 year old male patient with a past medical history of moderate to severe persistent asthma presenting to [facility] on 09/23 with acute asthma exacerbation requiring BiPAP support.  # Severe acute asthma exacerbation in the settgin of what appears to be a URI with possible superimposed bacterial pneumonia #Morbid Obesity  #OSA/OHS   --Reassuringly his ABG looks okay on not bad minute ventilation.  He is breathing at a rate of 20 breaths/min on BiPAP with a minute ventilation of 9.  His pH is 7.4 with a pCO2 of 42.  []  IV Solu-Medrol  60 mg daily. []  DuoNebs every 4 hours scheduled.  And as needed. []  Continue with BiPAP support. []  Respiratory viral panel. []  IV azithromycin  500 mg daily for 3 days. []  If clinically does not improve in the next 2 hours or show any signs of  respiratory distress including increased minute ventilation then intubation will be the next step.  Pulmonary team will continue to follow.  I spent 80 minutes caring for this patient today, including preparing to see the patient, obtaining a medical history , reviewing a separately obtained history, performing a medically appropriate examination and/or evaluation, ordering medications, tests, or procedures, documenting clinical information in the electronic health record, and independently interpreting results (not separately reported/billed) and communicating results to the patient/family/caregiver  Darrin Barn, MD [facility] 09/27/2023 4:54 PM

## 2023-09-27 NOTE — ED Notes (Signed)
RT called to put pt on bipap

## 2023-09-27 NOTE — ED Triage Notes (Signed)
 To ED for SOB since last night. Has been using asthma inhalers. Pt is labored, using abdominal muscles, sitting forward, tachypneic, and pursed lip breathing. Audible wheezing across the room.

## 2023-09-27 NOTE — ED Provider Notes (Signed)
 [facility] Provider Note    Event Date/Time   First MD Initiated Contact with Patient 09/27/23 1036     (approximate)   History   Shortness of Breath   HPI  Justin Reid is a 43 year old male with history of asthma presenting to the emergency department for evaluation of shortness of breath.  Patient reports that last night he had onset of worsening difficulty breathing.  Has been taking his daily controlling medicine and using his rescue inhaler without improvement.  In triage was noted to have significantly labored respirations.  Received DuoNeb treatment prior to being brought back to room, denies significant improvement with this.  Does report that he has previously required admission for asthma exacerbations, has not previously been intubated.  Denies cough, congestion, fever.  No known sick contacts.      Physical Exam   Triage Vital Signs: ED Triage Vitals  Encounter Vitals Group     BP 09/27/23 1013 (!) 152/104     Girls Systolic BP Percentile --      Girls Diastolic BP Percentile --      Boys Systolic BP Percentile --      Boys Diastolic BP Percentile --      Pulse Rate 09/27/23 1013 (!) 101     Resp 09/27/23 1013 (!) 28     Temp 09/27/23 1013 98 F (36.7 C)     Temp Source 09/27/23 1013 Oral     SpO2 09/27/23 1013 99 %     Weight 09/27/23 1014 300 lb (136.1 kg)     Height 09/27/23 1014 5' 10 (1.778 m)     Head Circumference --      Peak Flow --      Pain Score 09/27/23 1012 9     Pain Loc --      Pain Education --      Exclude from Growth Chart --     Most recent vital signs: Vitals:   09/27/23 1400 09/27/23 1436  BP: (!) 152/91   Pulse: 85   Resp: (!) 24   Temp:  98.4 F (36.9 C)  SpO2: 99%      General: Awake, interactive, appears uncomfortable CV:  Regular rate, good peripheral perfusion.  Resp:  Apneic with labored respirations, diffuse expiratory wheezing, accessory muscle use noted Abd:  Nondistended.   Neuro:  Symmetric facial movement, fluid speech   ED Results / Procedures / Treatments   Labs (all labs ordered are listed, but only abnormal results are displayed) Labs Reviewed  BASIC METABOLIC PANEL WITH GFR - Abnormal; Notable for the following components:      Result Value   Calcium  8.6 (*)    All other components within normal limits  CBC - Abnormal; Notable for the following components:   Hemoglobin 12.7 (*)    Platelets 420 (*)    All other components within normal limits  BLOOD GAS, VENOUS - Abnormal; Notable for the following components:   pH, Ven 7.46 (*)    pCO2, Ven 37 (*)    pO2, Ven 56 (*)    Acid-Base Excess 2.5 (*)    All other components within normal limits  BLOOD GAS, ARTERIAL - Abnormal; Notable for the following components:   pO2, Arterial 138 (*)    All other components within normal limits  RESP PANEL BY RT-PCR (RSV, FLU A&B, COVID)  RVPGX2  RESPIRATORY PANEL BY PCR     EKG EKG independently reviewed and interpreted by myself  demonstrates:    RADIOLOGY Imaging independently reviewed and interpreted by myself demonstrates:  CXR without focal consolidation on both x-rays obtained  Formal Radiology Read:  DG Chest Portable 1 View Result Date: 09/27/2023 CLINICAL DATA:  asthma, asymmetric lung sounds since last xr EXAM: PORTABLE CHEST 1 VIEW COMPARISON:  Chest XR, 09/27/2023 at 1041 hours. CTA chest, 04/02/2023. FINDINGS: Cardiomediastinal silhouette is within normal limits. Lungs are hypoinflated with minimal bibasilar linear opacities. No focal consolidation or mass. No pleural effusion or pneumothorax. No acute displaced fracture. IMPRESSION: Hypoinflation without acute superimposed cardiopulmonary process. Electronically Signed   By: Thom Hall M.D.   On: 09/27/2023 14:17   DG Chest Portable 1 View Result Date: 09/27/2023 CLINICAL DATA:  Wheezing and shortness of breath. EXAM: PORTABLE CHEST 1 VIEW COMPARISON:  09/06/2023.  Chest CTA dated  04/02/2023. FINDINGS: Poor inspiration. Grossly normal sized heart. Mild prominence of the pulmonary vasculature, accentuated by the poor inspiration. Clear lungs. Small calcified granuloma or bone island overlying the left mid lung zone laterally. Left glenohumeral degenerative changes and bursal calcified loose bodies. IMPRESSION: Poor inspiration with no acute abnormality. Electronically Signed   By: Elspeth Bathe M.D.   On: 09/27/2023 11:19    PROCEDURES:  Critical Care performed: Yes, see critical care procedure note(s)  CRITICAL CARE Performed by: Nilsa Dade   Total critical care time: 32 minutes  Critical care time was exclusive of separately billable procedures and treating other patients.  Critical care was necessary to treat or prevent imminent or life-threatening deterioration.  Critical care was time spent personally by me on the following activities: development of treatment plan with patient and/or surrogate as well as nursing, discussions with consultants, evaluation of patient's response to treatment, examination of patient, obtaining history from patient or surrogate, ordering and performing treatments and interventions, ordering and review of laboratory studies, ordering and review of radiographic studies, pulse oximetry and re-evaluation of patient's condition.   Procedures   MEDICATIONS ORDERED IN ED: Medications  methylPREDNISolone  sodium succinate (SOLU-MEDROL ) 125 mg/2 mL injection 60 mg (has no administration in time range)  albuterol  (PROVENTIL ) (2.5 MG/3ML) 0.083% nebulizer solution 2.5 mg (has no administration in time range)  azithromycin  (ZITHROMAX ) 500 mg in sodium chloride  0.9 % 250 mL IVPB (500 mg Intravenous New Bag/Given 09/27/23 1433)  ipratropium-albuterol  (DUONEB) 0.5-2.5 (3) MG/3ML nebulizer solution 3 mL ( Nebulization Not Given 09/27/23 1050)  ipratropium-albuterol  (DUONEB) 0.5-2.5 (3) MG/3ML nebulizer solution 3 mL (3 mLs Nebulization Given 09/27/23  1049)  methylPREDNISolone  sodium succinate (SOLU-MEDROL ) 125 mg/2 mL injection 125 mg (125 mg Intravenous Given 09/27/23 1120)  albuterol  (PROVENTIL ) (2.5 MG/3ML) 0.083% nebulizer solution 10 mg (10 mg Nebulization Given 09/27/23 1123)  magnesium  sulfate IVPB 2 g 50 mL (0 g Intravenous Stopped 09/27/23 1318)     IMPRESSION / MDM / ASSESSMENT AND PLAN / ED COURSE  I reviewed the triage vital signs and the nursing notes.  Differential diagnosis includes, but is not limited to, asthma exacerbation, pneumonia, viral illness, presentation not consistent with anaphylaxis  Patient's presentation is most consistent with acute presentation with potential threat to life or bodily function.  43 year old male presenting to the emergency department for evaluation of shortness of breath.  In respiratory distress on presentation with tachypnea, labored respirations.  Ordered for additional bronchodilators, steroids, will continue to monitor.   Clinical Course as of 09/27/23 1450  Tue Sep 27, 2023  1214 Patient reassessed after multiple DuoNebs, continuous albuterol , Solu-Medrol , ongoing magnesium .  Continues to have labored respirations with  poor air movement and wheezing.  Will start patient on BiPAP.  If remains without significant improvement consideration for epi drip, discussion with ICU [NR]  1310 Patient did have some improved work of breathing on reassessment initiation of BiPAP, but remained with persistent shortness of breath, tachypnea, labored respirations and wheezing.  Case was discussed with Dr. Malka who recommended ABG for further evaluation.  He will come assess the patient but does not recommend further medical interventions at this time. [NR]  1326 Patient assessed by Dr. Malka.  Does agree with tenuous respiratory status.  Recommends repeat chest x-Lynnlee Revels given asymmetric breath sounds on his his exam, ABG, continuous albuterol  to be ordered every 2 hours. [NR]  1440 Patient reassessed.  On  my exam does seem to have some improved breath sounds but subjectively does not feel significantly improved.  Discussed with Dr. Malka.  Repeat ABG with improved pH of 7.39, pCO2 42, bicarb 25.  He does feel patient can be admitted to stepdown for further management.  Reach out to hospitalist team. [NR]  1447 Case discussed with Dr. Laurita.  He will evaluate for anticipated admission. [NR]    Clinical Course User Index [NR] Levander Slate, MD     FINAL CLINICAL IMPRESSION(S) / ED DIAGNOSES   Final diagnoses:  Severe asthma with exacerbation, unspecified whether persistent     Rx / DC Orders   ED Discharge Orders     None        Note:  This document was prepared using Dragon voice recognition software and may include unintentional dictation errors.   Levander Slate, MD 09/27/23 [phone]

## 2023-09-27 NOTE — ED Notes (Signed)
 Primary RN made aware triage RN did not draw blood or place IV.

## 2023-09-27 NOTE — Progress Notes (Signed)
 PHARMACIST - PHYSICIAN COMMUNICATION  CONCERNING:  Enoxaparin  (Lovenox ) for DVT Prophylaxis    RECOMMENDATION: Patient was prescribed enoxaprin 40mg  q24 hours for VTE prophylaxis.   Filed Weights   09/27/23 1014  Weight: 136.1 kg (300 lb)    Body mass index is 43.05 kg/m.  Estimated Creatinine Clearance: 168.8 mL/min (by C-G formula based on SCr of 0.77 mg/dL).   Based on [facility] policy patient is candidate for enoxaparin  0.5mg /kg TBW SQ every 24 hours based on BMI being >30.  DESCRIPTION: Pharmacy has adjusted enoxaparin  dose per [facility] policy.  Patient is now receiving enoxaparin  67.5 mg every 24 hours    Lum VEAR Mania, PharmD Clinical Pharmacist  09/27/2023 3:14 PM

## 2023-09-27 NOTE — ED Notes (Signed)
 Report called to [facility] nurse

## 2023-09-27 NOTE — ED Notes (Signed)
 Resumed care from logan rn.  Pt alert  on bipap.  Family with pt   pt waiting on room assignment.  Pt states feeling better

## 2023-09-28 ENCOUNTER — Other Ambulatory Visit: Payer: Self-pay

## 2023-09-28 DIAGNOSIS — Z1152 Encounter for screening for COVID-19: Secondary | ICD-10-CM | POA: Diagnosis not present

## 2023-09-28 DIAGNOSIS — J4551 Severe persistent asthma with (acute) exacerbation: Secondary | ICD-10-CM | POA: Diagnosis not present

## 2023-09-28 DIAGNOSIS — E662 Morbid (severe) obesity with alveolar hypoventilation: Secondary | ICD-10-CM | POA: Diagnosis present

## 2023-09-28 DIAGNOSIS — Z6841 Body Mass Index (BMI) 40.0 and over, adult: Secondary | ICD-10-CM | POA: Diagnosis not present

## 2023-09-28 DIAGNOSIS — E66813 Obesity, class 3: Secondary | ICD-10-CM

## 2023-09-28 DIAGNOSIS — Z87891 Personal history of nicotine dependence: Secondary | ICD-10-CM | POA: Diagnosis not present

## 2023-09-28 DIAGNOSIS — R0789 Other chest pain: Secondary | ICD-10-CM | POA: Diagnosis not present

## 2023-09-28 DIAGNOSIS — J4542 Moderate persistent asthma with status asthmaticus: Secondary | ICD-10-CM

## 2023-09-28 DIAGNOSIS — J4552 Severe persistent asthma with status asthmaticus: Secondary | ICD-10-CM | POA: Diagnosis present

## 2023-09-28 DIAGNOSIS — K219 Gastro-esophageal reflux disease without esophagitis: Secondary | ICD-10-CM | POA: Diagnosis not present

## 2023-09-28 DIAGNOSIS — J9601 Acute respiratory failure with hypoxia: Secondary | ICD-10-CM | POA: Diagnosis not present

## 2023-09-28 DIAGNOSIS — Z8249 Family history of ischemic heart disease and other diseases of the circulatory system: Secondary | ICD-10-CM | POA: Diagnosis not present

## 2023-09-28 DIAGNOSIS — Z79899 Other long term (current) drug therapy: Secondary | ICD-10-CM | POA: Diagnosis not present

## 2023-09-28 DIAGNOSIS — I10 Essential (primary) hypertension: Secondary | ICD-10-CM

## 2023-09-28 DIAGNOSIS — R0602 Shortness of breath: Secondary | ICD-10-CM | POA: Diagnosis present

## 2023-09-28 DIAGNOSIS — Z791 Long term (current) use of non-steroidal anti-inflammatories (NSAID): Secondary | ICD-10-CM | POA: Diagnosis not present

## 2023-09-28 LAB — TROPONIN I (HIGH SENSITIVITY)
Troponin I (High Sensitivity): 4 ng/L (ref <18)
Troponin I (High Sensitivity): 4 ng/L (ref <18)

## 2023-09-28 LAB — BASIC METABOLIC PANEL WITH GFR
Anion gap: 9 (ref 5–15)
BUN: 21 mg/dL — ABNORMAL HIGH (ref 6–20)
CO2: 25 mmol/L (ref 22–32)
Calcium: 8.4 mg/dL — ABNORMAL LOW (ref 8.9–10.3)
Chloride: 103 mmol/L (ref 98–111)
Creatinine, Ser: 0.83 mg/dL (ref 0.61–1.24)
GFR, Estimated: >60 mL/min (ref >60)
Glucose, Bld: 121 mg/dL — ABNORMAL HIGH (ref 70–99)
Potassium: 4.2 mmol/L (ref 3.5–5.1)
Sodium: 137 mmol/L (ref 135–145)

## 2023-09-28 LAB — CBC
HCT: 38.2 % — ABNORMAL LOW (ref 39.0–52.0)
Hemoglobin: 11.9 g/dL — ABNORMAL LOW (ref 13.0–17.0)
MCH: 27 pg (ref 26.0–34.0)
MCHC: 31.2 g/dL (ref 30.0–36.0)
MCV: 86.8 fL (ref 80.0–100.0)
Platelets: 411 K/uL — ABNORMAL HIGH (ref 150–400)
RBC: 4.4 MIL/uL (ref 4.22–5.81)
RDW: 14.9 % (ref 11.5–15.5)
WBC: 11.5 K/uL — ABNORMAL HIGH (ref 4.0–10.5)
nRBC: 0 % (ref 0.0–0.2)

## 2023-09-28 LAB — PHOSPHORUS: Phosphorus: 3.4 mg/dL (ref 2.5–4.6)

## 2023-09-28 LAB — D-DIMER, QUANTITATIVE: D-Dimer, Quant: 0.48 ug{FEU}/mL (ref 0.00–0.50)

## 2023-09-28 LAB — MAGNESIUM: Magnesium: 2.6 mg/dL — ABNORMAL HIGH (ref 1.7–2.4)

## 2023-09-28 MED ORDER — KETOROLAC TROMETHAMINE 15 MG/ML IJ SOLN
15.0000 mg | Freq: Once | INTRAMUSCULAR | Status: AC
  Administered 2023-09-28: 15 mg via INTRAVENOUS
  Filled 2023-09-28: qty 1

## 2023-09-28 MED ORDER — ENOXAPARIN SODIUM 80 MG/0.8ML IJ SOSY
0.5000 mg/kg | PREFILLED_SYRINGE | INTRAMUSCULAR | Status: DC
  Administered 2023-09-28 – 2023-09-29 (×2): 77.5 mg via SUBCUTANEOUS
  Filled 2023-09-28 (×2): qty 0.8

## 2023-09-28 MED ORDER — CHLORHEXIDINE GLUCONATE CLOTH 2 % EX PADS
6.0000 | MEDICATED_PAD | Freq: Every day | CUTANEOUS | Status: DC
  Administered 2023-09-28 – 2023-09-29 (×2): 6 via TOPICAL

## 2023-09-28 MED ORDER — NITROGLYCERIN 0.4 MG SL SUBL
0.4000 mg | SUBLINGUAL_TABLET | SUBLINGUAL | Status: DC | PRN
  Administered 2023-09-28 (×2): 0.4 mg via SUBLINGUAL
  Filled 2023-09-28 (×2): qty 1

## 2023-09-28 MED ORDER — PREDNISONE 50 MG PO TABS
60.0000 mg | ORAL_TABLET | Freq: Every day | ORAL | Status: DC
  Administered 2023-09-29: 60 mg via ORAL
  Filled 2023-09-28: qty 1

## 2023-09-28 MED ORDER — CHLORHEXIDINE GLUCONATE CLOTH 2 % EX PADS: 6.0000 | MEDICATED_PAD | Freq: Every day | CUTANEOUS | Status: DC

## 2023-09-28 NOTE — Progress Notes (Addendum)
 Notified by nursing staff that the patient had 10 out of 10 chest pain. EKG interpreted by me shows sinus rhythm 71 bpm no acute ST-T wave changes.  Patient given nitroglycerin  and troponins ordered.  Called back by nursing staff that pain is now in the back. Ordered a dose of Toradol .  First troponin 4.  D-dimer negative at 0.48.  Continue to monitor.  Dr Charlie Patterson 15 minutes

## 2023-09-28 NOTE — Progress Notes (Signed)
 This is a case of 43 year old male patient with a past medical history of moderate to severe persistent asthma presenting to [facility] on 09/23 with acute asthma exacerbation requiring BiPAP support.   He reports being congested for the past couple of days which has progressed to wheezing chest tightness and significant shortness of breath.  In the ED was noted to be in moderate respiratory distress requiring systemic steroids IV mag times two 1 hour albuterol  neb and BiPAP support.   Pulmonary team is being consulted for help with further management.   He had multiple exacerbations in the past year with multiple presentations to hospital.  But he never required intubation.   Chest x-ray with hypoinflation.   Subjective  Patients respiratory status improved overnight. Doing better this morning on Nasal cannula.    Physical exam GEN mild respiratory distress HEENT supple neck, reactive pupils, EOMI CVS tachycardic regular rhythm Lungs diffuse expiratory wheezing poor air movement of the the right hemithorax (Improved from yesterday) Abdomen soft nontender nondistended obese Extremities warm well-perfused no edema.   Labs and imaging were reviewed.    A&P This is a case of 43 year old male patient with a past medical history of moderate to severe persistent asthma presenting to [facility] on 09/23 with acute asthma exacerbation requiring BiPAP support.   # Severe acute asthma exacerbation in the settgin of what appears to be a URI with possible superimposed bacterial pneumonia #Morbid Obesity  #OSA/OHS   Improved since yesterday   []  Can switch to prednisnone 60mg  daily. Will likely need a longer taper taper.  []  DuoNebs every 4 hours scheduled.  And as needed. []  Continue with BiPAP as needed and specially at night.  []  Respiratory viral panel.(Negative)  []  IV azithromycin  500 mg daily for 3 days. []  He will need to be discharged on high dose ICS-LABA examples include ( Trelegy Ellipta 200  1 puff daily, Advair disk 500 1 puff bid, Advair HFA 230 2puffs bid.  []  He is interested in establishing care at [facility] pulmonary as outpatient ( will arrange for that)   Pulmonary team will continue to follow.   I spent 50 minutes caring for this patient today, including preparing to see the patient, obtaining a medical history , reviewing a separately obtained history, performing a medically appropriate examination and/or evaluation, counseling and educating the patient/family/caregiver, documenting clinical information in the electronic health record, and independently interpreting results (not separately reported/billed) and communicating results to the patient/family/caregiver  Darrin Barn, MD [facility] 09/28/2023 10:51 AM

## 2023-09-28 NOTE — Assessment & Plan Note (Signed)
 On Protonix

## 2023-09-28 NOTE — Assessment & Plan Note (Signed)
 BMI 49.19.  Will need outpatient sleep study.

## 2023-09-28 NOTE — Hospital Course (Signed)
 43 y.o. male with medical history significant of moderate asthma, morbid obesity, HTN, presented with worsening of cough wheezing shortness of breath.   Symptoms started yesterday evening, quickly deteriorated overnight could not sleep despite using around-the-clock albuterol .  Unable to lie down at all this morning and had to sit up sitting forward in tripod position.  Denied any fever or chills.   ED Course: Afebrile, tachycardia blood pressure elevated 150/100 ultrafiltration 90% on room air, very tachypneic plan patient was placed on BiPAP.  Chest x-ray negative for acute infiltrates.  WBC 8.8 hemoglobin 12 BUN 19 creatinine 0.5.  Initial VBG showed 7.4 6/37/56, repeat ABG 2 hours showed 7.3 9/42/138.   Patient was given IV Solu-Medrol  magnesium  and multiple rounds of bronchodilator but remained symptomatic.  9/24.  Patient still feeling short of breath.  History of asthma.  No history of smoking. 9/25.  Patient starting to feel little bit better.  Pulse ox dropped to 83% with walking on room air.

## 2023-09-28 NOTE — Assessment & Plan Note (Signed)
 On metoprolol  and Lasix 

## 2023-09-28 NOTE — Assessment & Plan Note (Signed)
 Patient admitted to the stepdown unit on continuous BiPAP.  This morning on BiPAP and hopefully will be able to convert to nasal cannula.  Patient still very tight.  Pulmonary switched over to 60 mg of prednisone .  Continue inhalers and nebulizers.

## 2023-09-28 NOTE — Progress Notes (Signed)
 Progress Note   Patient: Justin Reid FMW:[id number] DOB: [birth year] DOA: 09/27/2023     0 DOS: the patient was seen and examined on 09/28/2023   Brief hospital course: 43 y.o. male with medical history significant of moderate asthma, morbid obesity, HTN, presented with worsening of cough wheezing shortness of breath.   Symptoms started yesterday evening, quickly deteriorated overnight could not sleep despite using around-the-clock albuterol .  Unable to lie down at all this morning and had to sit up sitting forward in tripod position.  Denied any fever or chills.   ED Course: Afebrile, tachycardia blood pressure elevated 150/100 ultrafiltration 90% on room air, very tachypneic plan patient was placed on BiPAP.  Chest x-ray negative for acute infiltrates.  WBC 8.8 hemoglobin 12 BUN 19 creatinine 0.5.  Initial VBG showed 7.4 6/37/56, repeat ABG 2 hours showed 7.3 9/42/138.   Patient was given IV Solu-Medrol  magnesium  and multiple rounds of bronchodilator but remained symptomatic.  9/24.  Patient still feeling short of breath.  History of asthma.  No history of smoking.  Assessment and Plan: * Status asthmaticus Patient admitted to the stepdown unit on continuous BiPAP.  This morning on BiPAP and hopefully will be able to convert to nasal cannula.  Patient still very tight.  Pulmonary switched over to 60 mg of prednisone .  Continue inhalers and nebulizers.  Essential hypertension On metoprolol  and Lasix   Obesity, Class III, BMI 40-49.9 (morbid obesity) BMI 49.19.  Will need outpatient sleep study.  GERD (gastroesophageal reflux disease) On Protonix         Subjective: Patient still feels short of breath.  No history of smoking.  History of asthma.  Needs help with medications.  Physical Exam: Vitals:   09/28/23 0900 09/28/23 1000 09/28/23 1100 09/28/23 1200  BP: (!) 149/64 (!) 142/81 (!) 145/96 (!) 150/92  Pulse: 84 71 75 78  Resp:    20  Temp:    97.8 F (36.6 C)  TempSrc:     Axillary  SpO2: 97% 100% 90% 95%  Weight:      Height:       Physical Exam HENT:     Head: Normocephalic.  Eyes:     General: Lids are normal.     Conjunctiva/sclera: Conjunctivae normal.  Cardiovascular:     Rate and Rhythm: Normal rate and regular rhythm.     Heart sounds: Normal heart sounds, S1 normal and S2 normal.  Pulmonary:     Breath sounds: Examination of the right-middle field reveals decreased breath sounds. Examination of the left-middle field reveals decreased breath sounds. Examination of the right-lower field reveals decreased breath sounds. Examination of the left-lower field reveals decreased breath sounds. Decreased breath sounds present. No wheezing, rhonchi or rales.  Abdominal:     Palpations: Abdomen is soft.     Tenderness: There is no abdominal tenderness.  Musculoskeletal:     Right lower leg: No swelling.     Left lower leg: No swelling.  Skin:    General: Skin is warm.     Findings: No rash.  Neurological:     Mental Status: He is alert and oriented to person, place, and time.     Data Reviewed: White blood cell 11.5, hemoglobin 11.9, platelet count 411, creatinine 0.83, electrolytes normal range Chest x-ray shows hyperinflation without acute superimposed cardiopulmonary process Viral respiratory panel and COVID test negative Family Communication: Permission to speak in front of visitor  Disposition: Status is: Inpatient Remains inpatient appropriate because: Patient was on BiPAP  this morning.  Planned Discharge Destination: Home    Time spent: 28 minutes  Author: Charlie Patterson, MD 09/28/2023 12:35 PM  For on call review [email or web address].

## 2023-09-28 NOTE — TOC Initial Note (Signed)
 Transition of Care [facility]) - Initial/Assessment Note    Patient Details  Name: Justin Reid MRN: [id number] Date of Birth: 11-09-1980  [facility] CM/SW Contact:    Corrie JINNY Ruts, LCSW Phone Number: 09/28/2023, 11:19 AM  Clinical Narrative:                 Chart reviewed. The patient was admitted for Status Asthmaticus. I spoke with the patient and his significant other today at bedside. I introduced myself, my role, and reason for consult. The patient reports that he is ok today. The patient reports that he has an upcoming appointement at [facility] but does not know who the provider it.   The patient reports that he was able to complete daily living task independently before being admitted to the hospital. The patient reports that if he had medical appointments then he was able to drive himself. The patient reports that his significant other will help him during discharge.   The patient reports that he using [facility] pharmacy. The patient reports that he has never had HH or been admitted into a SNF before. The patient reports that he does not have any equipment in the home.  I consulted with the patient about PCP needs, Insurance needs, and medication assistance. The patient consented to medication assistance and insurance needs. I have made Meds to bed and the insurance department aware.  There are no other TOC needs at this time. TOC will follow the patient until discharge.     Barriers to Discharge: Continued Medical Work up   Patient Goals and CMS Choice            Expected Discharge Plan and Services                                              Prior Living Arrangements/Services   Lives with:: Spouse Patient language and need for interpreter reviewed:: Yes        Need for Family Participation in Patient Care: Yes (Comment)     Criminal Activity/Legal Involvement Pertinent to Current Situation/Hospitalization: No - Comment as  needed  Activities of Daily Living   ADL Screening (condition at time of admission) Independently performs ADLs?: Yes (appropriate for developmental age) Is the patient deaf or have difficulty hearing?: No Does the patient have difficulty seeing, even when wearing glasses/contacts?: No Does the patient have difficulty concentrating, remembering, or making decisions?: No  Permission Sought/Granted                  Emotional Assessment Appearance:: Appears stated age Attitude/Demeanor/Rapport: Gracious Affect (typically observed): Calm, Pleasant Orientation: : Oriented to Place, Oriented to Self, Oriented to  Time, Oriented to Situation Alcohol / Substance Use: Not Applicable    Admission diagnosis:  Status asthmaticus [J45.902] Severe asthma with exacerbation, unspecified whether persistent [J45.901] Patient Active Problem List   Diagnosis Date Noted   Status asthmaticus 09/27/2023   Chronic pain 05/31/2023   Cough 05/30/2023   Bilateral knee effusions 04/29/2023   Non-cardiac chest pain 04/20/2023   Pleuritic chest pain 04/09/2023   GERD (gastroesophageal reflux disease) 04/09/2023   Vocal cord dysfunction 04/01/2023   Obesity, Class III, BMI 40-49.9 (morbid obesity) 02/21/2023   Tobacco use 02/21/2023   Acute respiratory failure with hypoxia (HCC) 01/20/2023   HTN (hypertension) 01/20/2023   Tobacco use disorder 12/28/2021  OSA (obstructive sleep apnea) 12/28/2021   At risk for sleep apnea 11/10/2020   Obesity hypoventilation syndrome (HCC) 11/10/2020   Truncal obesity 11/10/2020   Acute severe exacerbation of severe persistent asthma 02/20/2016   PCP:  Freddrick No Pharmacy:   [facility] [address], [place] - [address] [place] [address] Phone: [phone] Fax: [phone]     Social Drivers of Health (SDOH) Social History: SDOH Screenings   Food Insecurity: No Food Insecurity (09/27/2023)  Housing: Low Risk  (09/27/2023)   Transportation Needs: No Transportation Needs (09/27/2023)  Utilities: Not At Risk (09/27/2023)  Social Connections: Moderately Isolated (05/30/2023)  Tobacco Use: Medium Risk (09/27/2023)   SDOH Interventions:     Readmission Risk Interventions    04/03/2023   12:24 PM 03/08/2023    1:02 PM  Readmission Risk Prevention Plan  Transportation Screening Complete Complete  PCP or Specialist Appt within 3-5 Days Complete --  HRI or Home Care Consult Complete Complete  Social Work Consult for Recovery Care Planning/Counseling Complete Complete  Palliative Care Screening Not Applicable Not Applicable  Medication Review Oceanographer) Complete Referral to Pharmacy

## 2023-09-28 NOTE — Progress Notes (Addendum)
 Pt was able to ambulate around the nursing station. He was wearing 2L [address] and his oxygen  saturation stay above 92 %.  1528 pt woke up complaining of chest pain 10/10.Dr Josette was notified, EKG, nitroglycerin , and labs were order.

## 2023-09-29 DIAGNOSIS — J4551 Severe persistent asthma with (acute) exacerbation: Secondary | ICD-10-CM

## 2023-09-29 DIAGNOSIS — J9601 Acute respiratory failure with hypoxia: Secondary | ICD-10-CM

## 2023-09-29 LAB — CBC
HCT: 37.1 % — ABNORMAL LOW (ref 39.0–52.0)
Hemoglobin: 11.7 g/dL — ABNORMAL LOW (ref 13.0–17.0)
MCH: 27.1 pg (ref 26.0–34.0)
MCHC: 31.5 g/dL (ref 30.0–36.0)
MCV: 86.1 fL (ref 80.0–100.0)
Platelets: 415 K/uL — ABNORMAL HIGH (ref 150–400)
RBC: 4.31 MIL/uL (ref 4.22–5.81)
RDW: 15.1 % (ref 11.5–15.5)
WBC: 13.5 K/uL — ABNORMAL HIGH (ref 4.0–10.5)
nRBC: 0 % (ref 0.0–0.2)

## 2023-09-29 LAB — BASIC METABOLIC PANEL WITH GFR
Anion gap: 10 (ref 5–15)
BUN: 20 mg/dL (ref 6–20)
CO2: 25 mmol/L (ref 22–32)
Calcium: 8.7 mg/dL — ABNORMAL LOW (ref 8.9–10.3)
Chloride: 103 mmol/L (ref 98–111)
Creatinine, Ser: 1 mg/dL (ref 0.61–1.24)
GFR, Estimated: >60 mL/min (ref >60)
Glucose, Bld: 130 mg/dL — ABNORMAL HIGH (ref 70–99)
Potassium: 3.8 mmol/L (ref 3.5–5.1)
Sodium: 138 mmol/L (ref 135–145)

## 2023-09-29 LAB — MAGNESIUM: Magnesium: 2.3 mg/dL (ref 1.7–2.4)

## 2023-09-29 LAB — PHOSPHORUS: Phosphorus: 3.3 mg/dL (ref 2.5–4.6)

## 2023-09-29 MED ORDER — PREDNISONE 50 MG PO TABS
50.0000 mg | ORAL_TABLET | Freq: Every day | ORAL | Status: DC
  Administered 2023-09-30: 50 mg via ORAL
  Filled 2023-09-29: qty 1

## 2023-09-29 NOTE — Assessment & Plan Note (Signed)
 Initially requiring BiPAP for respiratory distress.  Today unable to come off oxygen  dropping down pulse ox to 83% on room air with ambulation.  Patient back on oxygen .

## 2023-09-29 NOTE — Plan of Care (Signed)

## 2023-09-29 NOTE — Progress Notes (Signed)
 This is a case of 43 year old male patient with a past medical history of moderate to severe persistent asthma presenting to [facility] on 09/23 with acute asthma exacerbation requiring BiPAP support.   He reports being congested for the past couple of days which has progressed to wheezing chest tightness and significant shortness of breath.  In the ED was noted to be in moderate respiratory distress requiring systemic steroids IV mag times two 1 hour albuterol  neb and BiPAP support.   Pulmonary team is being consulted for help with further management.   He had multiple exacerbations in the past year with multiple presentations to hospital.  But he never required intubation.   Chest x-ray with hypoinflation.   Subjective  Patient doing better this AM. No major events overnight.    Physical exam GEN NAD HEENT supple neck, reactive pupils, EOMI CVS tachycardic regular rhythm Lungs diffuse expiratory wheezing poor air movement of the the right hemithorax (continues Abdomen soft nontender nondistended obese Extremities warm well-perfused no edema.   Labs and imaging were reviewed.    A&P This is a case of 43 year old male patient with a past medical history of moderate to severe persistent asthma presenting to [facility] on 09/23 with acute asthma exacerbation requiring BiPAP support.   # Severe acute asthma exacerbation in the settgin of what appears to be a URI with possible superimposed bacterial pneumonia #Morbid Obesity  #OSA/OHS   Improved since yesterday   []  Can switch to prednisnone 60mg  daily and start Taper (decrease by 10mg  every 3 days).  []  DuoNebs every 4 hours scheduled.  And as needed. []  Continue with BiPAP as needed and specially at night.  []  Respiratory viral panel.(Negative)  []  IV azithromycin  500 mg daily for 3 days. []  He will need to be discharged on high dose ICS-LABA examples include ( Trelegy Ellipta 200 1 puff daily, Advair disk 500 1 puff bid, Advair HFA 230 2puffs  bid.  []  He is interested in establishing care at [facility] pulmonary as outpatient ( will arrange for that)   Pulmonary team will sign off.   I spent 30 minutes caring for this patient today, including preparing to see the patient, obtaining a medical history , reviewing a separately obtained history, performing a medically appropriate examination and/or evaluation, counseling and educating the patient/family/caregiver, documenting clinical information in the electronic health record, and independently interpreting results (not separately reported/billed) and communicating results to the patient/family/caregiver  Darrin Barn, MD [facility] 09/29/2023 12:37 PM

## 2023-09-29 NOTE — Assessment & Plan Note (Signed)
 Patient admitted to the stepdown unit on continuous BiPAP.  This morning on BiPAP and hopefully will be able to convert to nasal cannula.  Patient still very tight.  Pulmonary switched over to 60 mg of prednisone .  Continue inhalers and nebulizers.

## 2023-09-29 NOTE — Progress Notes (Signed)
 Progress Note   Patient: Justin Reid FMW:[id number] DOB: Feb 15, 1980 DOA: 09/27/2023     1 DOS: the patient was seen and examined on 09/29/2023   Brief hospital course: 43 y.o. male with medical history significant of moderate asthma, morbid obesity, HTN, presented with worsening of cough wheezing shortness of breath.   Symptoms started yesterday evening, quickly deteriorated overnight could not sleep despite using around-the-clock albuterol .  Unable to lie down at all this morning and had to sit up sitting forward in tripod position.  Denied any fever or chills.   ED Course: Afebrile, tachycardia blood pressure elevated 150/100 ultrafiltration 90% on room air, very tachypneic plan patient was placed on BiPAP.  Chest x-ray negative for acute infiltrates.  WBC 8.8 hemoglobin 12 BUN 19 creatinine 0.5.  Initial VBG showed 7.4 6/37/56, repeat ABG 2 hours showed 7.3 9/42/138.   Patient was given IV Solu-Medrol  magnesium  and multiple rounds of bronchodilator but remained symptomatic.  9/24.  Patient still feeling short of breath.  History of asthma.  No history of smoking. 9/25.  Patient starting to feel little bit better.  Pulse ox dropped to 83% with walking on room air.  Assessment and Plan: Acute severe exacerbation of severe persistent asthma Patient admitted to the stepdown unit on continuous BiPAP.  This morning on BiPAP and hopefully will be able to convert to nasal cannula.  Patient still very tight.  Pulmonary switched over to 60 mg of prednisone .  Continue inhalers and nebulizers.  Acute hypoxic respiratory failure (HCC) Initially requiring BiPAP for respiratory distress.  Today unable to come off oxygen  dropping down pulse ox to 83% on room air with ambulation.  Patient back on oxygen .  Essential hypertension On metoprolol  and Lasix   Other chest pain Cardiac enzymes negative.  Likely pleuritic in nature.  D-dimer also negative  Obesity, Class III, BMI 40-49.9 (morbid  obesity) BMI 49.19.  Will need outpatient sleep study.  GERD (gastroesophageal reflux disease) On Protonix         Subjective: Patient started looking a little bit better.  Still with some cough and some pain.  Admitted with asthma exacerbation.  Pulse ox dropped down to 83% with ambulation on room air today.  Physical Exam: Vitals:   09/29/23 0741 09/29/23 0905 09/29/23 1129 09/29/23 1252  BP:  (!) 140/87 124/80   Pulse:  71 75   Resp:   19   Temp:   97.7 F (36.5 C)   TempSrc:      SpO2: 97%  95% (!) 83%  Weight:      Height:       Physical Exam HENT:     Head: Normocephalic.  Eyes:     General: Lids are normal.     Conjunctiva/sclera: Conjunctivae normal.  Cardiovascular:     Rate and Rhythm: Normal rate and regular rhythm.     Heart sounds: Normal heart sounds, S1 normal and S2 normal.  Pulmonary:     Breath sounds: Examination of the right-lower field reveals decreased breath sounds. Examination of the left-lower field reveals decreased breath sounds. Decreased breath sounds present. No wheezing, rhonchi or rales.  Abdominal:     Palpations: Abdomen is soft.     Tenderness: There is no abdominal tenderness.  Musculoskeletal:     Right lower leg: No swelling.     Left lower leg: No swelling.  Skin:    General: Skin is warm.     Findings: No rash.  Neurological:     Mental Status:  He is alert and oriented to person, place, and time.     Data Reviewed: Creatinine 1.0, electrolytes normal range, white blood cell count 13.5, hemoglobin 11.7, platelet count 415  Family Communication: spoke to family at bedside  Disposition: Status is: Inpatient Remains inpatient appropriate because: Trying to see if we can get him off oxygen  prior to disposition.  Planned Discharge Destination: Home    Time spent: 27 minutes Case discussed with pulmonary  Author: Charlie Patterson, MD 09/29/2023 1:09 PM  For on call review [email or web address].

## 2023-09-29 NOTE — TOC CM/SW Note (Signed)
 Transition of Care [facility]) - Inpatient Brief Assessment   Patient Details  Name: Justin Reid MRN: [id number] Date of Birth: 1980-02-12  [facility] CM/SW Contact:    Alfonso Rummer, LCSW Phone Number: 09/29/2023, 12:55 PM   Clinical Narrative:  Justin Reid Rummer completed TOC chart review no TOC needs identified. Contact TOC should needs arise.   Transition of Care Asessment:

## 2023-09-29 NOTE — Assessment & Plan Note (Signed)
 Cardiac enzymes negative.  Likely pleuritic in nature.  D-dimer also negative

## 2023-09-30 ENCOUNTER — Other Ambulatory Visit: Payer: Self-pay

## 2023-09-30 DIAGNOSIS — I10 Essential (primary) hypertension: Secondary | ICD-10-CM

## 2023-09-30 DIAGNOSIS — R0789 Other chest pain: Secondary | ICD-10-CM

## 2023-09-30 DIAGNOSIS — J9601 Acute respiratory failure with hypoxia: Principal | ICD-10-CM

## 2023-09-30 DIAGNOSIS — J4551 Severe persistent asthma with (acute) exacerbation: Secondary | ICD-10-CM

## 2023-09-30 DIAGNOSIS — K219 Gastro-esophageal reflux disease without esophagitis: Secondary | ICD-10-CM

## 2023-09-30 DIAGNOSIS — E66813 Obesity, class 3: Secondary | ICD-10-CM

## 2023-09-30 MED ORDER — FUROSEMIDE 40 MG PO TABS
40.0000 mg | ORAL_TABLET | Freq: Every day | ORAL | 0 refills | Status: AC
  Filled 2023-09-30: qty 30, 30d supply, fill #0

## 2023-09-30 MED ORDER — AZITHROMYCIN 250 MG PO TABS
250.0000 mg | ORAL_TABLET | Freq: Every day | ORAL | Status: DC
  Administered 2023-09-30: 250 mg via ORAL
  Filled 2023-09-30: qty 1

## 2023-09-30 MED ORDER — OXYCODONE HCL 5 MG PO TABS
5.0000 mg | ORAL_TABLET | Freq: Four times a day (QID) | ORAL | 0 refills | Status: DC | PRN
  Filled 2023-09-30: qty 15, 4d supply, fill #0

## 2023-09-30 MED ORDER — ALBUTEROL SULFATE HFA 108 (90 BASE) MCG/ACT IN AERS
2.0000 | INHALATION_SPRAY | RESPIRATORY_TRACT | 0 refills | Status: DC | PRN
  Filled 2023-09-30: qty 6.7, 16d supply, fill #0

## 2023-09-30 MED ORDER — PANTOPRAZOLE SODIUM 40 MG PO TBEC
40.0000 mg | DELAYED_RELEASE_TABLET | Freq: Every day | ORAL | 0 refills | Status: AC
  Filled 2023-09-30: qty 30, 30d supply, fill #0

## 2023-09-30 MED ORDER — ALBUTEROL SULFATE (2.5 MG/3ML) 0.083% IN NEBU
2.5000 mg | INHALATION_SOLUTION | RESPIRATORY_TRACT | 0 refills | Status: DC | PRN
  Filled 2023-09-30: qty 225, 13d supply, fill #0

## 2023-09-30 MED ORDER — PREDNISONE 10 MG PO TABS
ORAL_TABLET | ORAL | 0 refills | Status: AC
  Filled 2023-09-30: qty 37, 17d supply, fill #0

## 2023-09-30 MED ORDER — METOPROLOL SUCCINATE ER 50 MG PO TB24
50.0000 mg | ORAL_TABLET | Freq: Every day | ORAL | 0 refills | Status: AC
  Filled 2023-09-30: qty 30, 30d supply, fill #0

## 2023-09-30 MED ORDER — AZITHROMYCIN 250 MG PO TABS
250.0000 mg | ORAL_TABLET | Freq: Every day | ORAL | 0 refills | Status: DC
  Filled 2023-09-30: qty 1, 1d supply, fill #0

## 2023-09-30 MED ORDER — MONTELUKAST SODIUM 10 MG PO TABS
10.0000 mg | ORAL_TABLET | Freq: Every day | ORAL | 0 refills | Status: AC
  Filled 2023-09-30: qty 30, 30d supply, fill #0

## 2023-09-30 MED ORDER — FLUTICASONE-SALMETEROL 250-50 MCG/ACT IN AEPB
1.0000 | INHALATION_SPRAY | Freq: Two times a day (BID) | RESPIRATORY_TRACT | 1 refills | Status: DC
  Filled 2023-09-30: qty 60, 30d supply, fill #0

## 2023-09-30 NOTE — Discharge Summary (Signed)
 Physician Discharge Summary   Patient: Justin Reid MRN: [id number] DOB: 06-15-1980  Admit date:     09/27/2023  Discharge date: 09/30/23  Discharge Physician: Charlie Patterson   PCP: Pcp, No   Recommendations at discharge:   [facility] Follow-up with Dr Malka pulmonary  Discharge Diagnoses: Principal Problem:   Status asthmaticus Active Problems:   Acute severe exacerbation of severe persistent asthma   Acute hypoxic respiratory failure (HCC)   Essential hypertension   Other chest pain   Obesity, Class III, BMI 40-49.9 (morbid obesity)   GERD (gastroesophageal reflux disease)  Resolved Problems:   * No resolved hospital problems. *  Hospital Course: 43 y.o. male with medical history significant of moderate asthma, morbid obesity, HTN, presented with worsening of cough wheezing shortness of breath.   Symptoms started yesterday evening, quickly deteriorated overnight could not sleep despite using around-the-clock albuterol .  Unable to lie down at all this morning and had to sit up sitting forward in tripod position.  Denied any fever or chills.   ED Course: Afebrile, tachycardia blood pressure elevated 150/100 ultrafiltration 90% on room air, very tachypneic plan patient was placed on BiPAP.  Chest x-ray negative for acute infiltrates.  WBC 8.8 hemoglobin 12 BUN 19 creatinine 0.5.  Initial VBG showed 7.4 6/37/56, repeat ABG 2 hours showed 7.3 9/42/138.   Patient was given IV Solu-Medrol  magnesium  and multiple rounds of bronchodilator but remained symptomatic.  9/24.  Patient still feeling short of breath.  History of asthma.  No history of smoking. 9/25.  Patient starting to feel little bit better.  Pulse ox dropped to 83% with walking on room air.  Assessment and Plan: Acute severe exacerbation of severe persistent asthma Patient admitted to the stepdown unit on continuous BiPAP.  The patient has had intermittent BiPAP.  Also  was on oxygen  during the hospital course.  Pulmonary switched over to 50 mg of prednisone  and okay with starting a long taper.  Continue inhalers and nebulizers.  Today the patient was able to ambulate and hold his saturations with ambulation.  Does not require oxygen  but going home.  Will need a sleep study in order to get a CPAP approved.  Acute hypoxic respiratory failure (HCC) Initially requiring BiPAP for respiratory distress.  Today unable to come off oxygen  dropping down pulse ox to 83% on room air with ambulation yesterday.  Patient able to hold his saturations with ambulation today.  Stable for discharge home.  Essential hypertension On metoprolol  and Lasix   Other chest pain Cardiac enzymes negative.  Likely pleuritic in nature.  D-dimer also negative  Obesity, Class III, BMI 40-49.9 (morbid obesity) BMI 49.19.  Will need outpatient sleep study.  GERD (gastroesophageal reflux disease) On Protonix          Consultants: Pulmonary Procedures performed: None Disposition: Home Diet recommendation:  Cardiac diet DISCHARGE MEDICATION: Allergies as of 09/30/2023   No Known Allergies      Medication List     STOP taking these medications    ipratropium-albuterol  0.5-2.5 (3) MG/3ML Soln Commonly known as: DUONEB       TAKE these medications    acetaminophen  325 MG tablet Commonly known as: TYLENOL  Take 2 tablets (650 mg total) by mouth every 6 (six) hours as needed for mild pain (pain score 1-3) or fever.   albuterol  (2.5 MG/3ML) 0.083% nebulizer solution Commonly known as: PROVENTIL  Take 3 mLs (2.5 mg total) by nebulization every 4 (four) hours  as needed for wheezing or shortness of breath.   albuterol  108 (90 Base) MCG/ACT inhaler Commonly known as: VENTOLIN  HFA Inhale 2 puffs into the lungs every 4 (four) hours as needed for wheezing or shortness of breath.   azithromycin  250 MG tablet Commonly known as: ZITHROMAX  Take 1 tablet (250 mg total) by mouth  daily for one more day Start taking on: October 01, 2023   furosemide  40 MG tablet Commonly known as: LASIX  Take 1 tablet (40 mg total) by mouth daily.   loratadine  10 MG tablet Commonly known as: CLARITIN  Take 1 tablet (10 mg total) by mouth daily.   meloxicam  15 MG tablet Commonly known as: MOBIC  Take 1 tablet (15 mg total) by mouth daily.   metoprolol  succinate 50 MG 24 hr tablet Commonly known as: TOPROL -XL Take 1 tablet (50 mg total) by mouth daily. Take with or immediately following a meal.   montelukast  10 MG tablet Commonly known as: SINGULAIR  Take 1 tablet (10 mg total) by mouth daily.   oxyCODONE  5 MG immediate release tablet Commonly known as: Roxicodone  Take 1 tablet (5 mg total) by mouth every 6 (six) hours as needed for severe pain (pain score 7-10).   pantoprazole  40 MG tablet Commonly known as: PROTONIX  Take 1 tablet (40 mg total) by mouth daily.   predniSONE  10 MG tablet Commonly known as: DELTASONE  Take 5 tablets (50 mg total) by mouth daily for 1 day, THEN 4 tablets (40 mg total) daily for 3 days, THEN 3 tablets (30 mg total) daily for 3 days, THEN 2 tablets (20 mg total) daily for 3 days, THEN 1 tablet (10 mg total) daily for 3 days, THEN 0.5 tablets (5 mg total) daily for 4 days. Start taking on: October 01, 2023   Wixela Inhub  250-50 MCG/ACT Aepb Generic drug: fluticasone -salmeterol Inhale 1 puff into the lungs in the morning and at bedtime.        Follow-up Information     Assaker, Darrin, MD Follow up in 2 week(s).   Specialty: Pulmonary Disease Contact information: [address] [place] [address] [phone]         [facility] Follow up in 1 week(s).   Specialty: General Practice Contact information: [address] [place] [address] [phone]                Discharge Exam: Filed Weights   09/27/23 1014 09/27/23 1606  Weight: 136.1 kg (!) 155.5  kg   Physical Exam HENT:     Head: Normocephalic.  Eyes:     General: Lids are normal.     Conjunctiva/sclera: Conjunctivae normal.  Cardiovascular:     Rate and Rhythm: Normal rate and regular rhythm.     Heart sounds: Normal heart sounds, S1 normal and S2 normal.  Pulmonary:     Breath sounds: Examination of the right-lower field reveals decreased breath sounds. Examination of the left-lower field reveals decreased breath sounds. Decreased breath sounds present. No wheezing, rhonchi or rales.  Abdominal:     Palpations: Abdomen is soft.     Tenderness: There is no abdominal tenderness.  Musculoskeletal:     Right lower leg: No swelling.     Left lower leg: No swelling.  Skin:    General: Skin is warm.     Findings: No rash.  Neurological:     Mental Status: He is alert and oriented to person, place, and time.  Condition at discharge: stable  The results of significant diagnostics from this hospitalization (including imaging, microbiology, ancillary and laboratory) are listed below for reference.   Imaging Studies: DG Chest Portable 1 View Result Date: 09/27/2023 CLINICAL DATA:  asthma, asymmetric lung sounds since last xr EXAM: PORTABLE CHEST 1 VIEW COMPARISON:  Chest XR, 09/27/2023 at 1041 hours. CTA chest, 04/02/2023. FINDINGS: Cardiomediastinal silhouette is within normal limits. Lungs are hypoinflated with minimal bibasilar linear opacities. No focal consolidation or mass. No pleural effusion or pneumothorax. No acute displaced fracture. IMPRESSION: Hypoinflation without acute superimposed cardiopulmonary process. Electronically Signed   By: Thom Hall M.D.   On: 09/27/2023 14:17   DG Chest Portable 1 View Result Date: 09/27/2023 CLINICAL DATA:  Wheezing and shortness of breath. EXAM: PORTABLE CHEST 1 VIEW COMPARISON:  09/06/2023.  Chest CTA dated 04/02/2023. FINDINGS: Poor inspiration. Grossly normal sized heart. Mild prominence of the pulmonary vasculature,  accentuated by the poor inspiration. Clear lungs. Small calcified granuloma or bone island overlying the left mid lung zone laterally. Left glenohumeral degenerative changes and bursal calcified loose bodies. IMPRESSION: Poor inspiration with no acute abnormality. Electronically Signed   By: Elspeth Bathe M.D.   On: 09/27/2023 11:19   DG Knee Complete 4 Views Left Result Date: 09/12/2023 CLINICAL DATA:  Fall. EXAM: LEFT KNEE - COMPLETE 4+ VIEW COMPARISON:  None Available. FINDINGS: No acute fracture or dislocation. No aggressive osseous lesion. There are degenerative changes of the knee joint in the form of mildly reduced medial tibio-femoral compartment joint space, tibial spiking and tricompartmental osteophytosis. No knee effusion or focal soft tissue swelling. No radiopaque foreign bodies. IMPRESSION: *No acute osseous abnormality of the left knee joint. Mild degenerative changes, as described above. Electronically Signed   By: Ree Molt M.D.   On: 09/12/2023 16:26   DG Chest 2 View Result Date: 09/06/2023 CLINICAL DATA:  Short of breath EXAM: CHEST - 2 VIEW COMPARISON:  None Available. FINDINGS: Normal mediastinum and cardiac silhouette. Normal pulmonary vasculature. No evidence of effusion, infiltrate, or pneumothorax. No acute bony abnormality. IMPRESSION: No aggressive osseous lesion. Electronically Signed   By: Jackquline Boxer M.D.   On: 09/06/2023 16:36    Microbiology: Results for orders placed or performed during the hospital encounter of 09/27/23  Resp panel by RT-PCR (RSV, Flu A&B, Covid) Anterior Nasal Swab     Status: None   Collection Time: 09/27/23 10:51 AM   Specimen: Anterior Nasal Swab  Result Value Ref Range Status   SARS Coronavirus 2 by RT PCR NEGATIVE NEGATIVE Final    Comment: (NOTE) SARS-CoV-2 target nucleic acids are NOT DETECTED.  The SARS-CoV-2 RNA is generally detectable in upper respiratory specimens during the acute phase of infection. The lowest concentration  of SARS-CoV-2 viral copies this assay can detect is 138 copies/mL. A negative result does not preclude SARS-Cov-2 infection and should not be used as the sole basis for treatment or other patient management decisions. A negative result may occur with  improper specimen collection/handling, submission of specimen other than nasopharyngeal swab, presence of viral mutation(s) within the areas targeted by this assay, and inadequate number of viral copies(<138 copies/mL). A negative result must be combined with clinical observations, patient history, and epidemiological information. The expected result is Negative.  Fact Sheet for Patients:  BloggerCourse.com  Fact Sheet for Healthcare Providers:  SeriousBroker.it  This test is no t yet approved or cleared by the [place]  FDA and  has been authorized for detection and/or diagnosis of SARS-CoV-2 by  FDA under an Emergency Use Authorization (EUA). This EUA will remain  in effect (meaning this test can be used) for the duration of the COVID-19 declaration under Section 564(b)(1) of the Act, 21 U.S.C.section 360bbb-3(b)(1), unless the authorization is terminated  or revoked sooner.       Influenza A by PCR NEGATIVE NEGATIVE Final   Influenza B by PCR NEGATIVE NEGATIVE Final    Comment: (NOTE) The Xpert Xpress SARS-CoV-2/FLU/RSV plus assay is intended as an aid in the diagnosis of influenza from Nasopharyngeal swab specimens and should not be used as a sole basis for treatment. Nasal washings and aspirates are unacceptable for Xpert Xpress SARS-CoV-2/FLU/RSV testing.  Fact Sheet for Patients: BloggerCourse.com  Fact Sheet for Healthcare Providers: SeriousBroker.it  This test is not yet approved or cleared by the [place]  FDA and has been authorized for detection and/or diagnosis of SARS-CoV-2 by FDA under an Emergency Use  Authorization (EUA). This EUA will remain in effect (meaning this test can be used) for the duration of the COVID-19 declaration under Section 564(b)(1) of the Act, 21 U.S.C. section 360bbb-3(b)(1), unless the authorization is terminated or revoked.     Resp Syncytial Virus by PCR NEGATIVE NEGATIVE Final    Comment: (NOTE) Fact Sheet for Patients: BloggerCourse.com  Fact Sheet for Healthcare Providers: SeriousBroker.it  This test is not yet approved or cleared by the [place]  FDA and has been authorized for detection and/or diagnosis of SARS-CoV-2 by FDA under an Emergency Use Authorization (EUA). This EUA will remain in effect (meaning this test can be used) for the duration of the COVID-19 declaration under Section 564(b)(1) of the Act, 21 U.S.C. section 360bbb-3(b)(1), unless the authorization is terminated or revoked.  Performed at [facility], [address] [place], [place] [address]   Respiratory (~20 pathogens) panel by PCR     Status: None   Collection Time: 09/27/23  2:36 PM   Specimen: Nasopharyngeal Swab; Respiratory  Result Value Ref Range Status   Adenovirus NOT DETECTED NOT DETECTED Final   Coronavirus 229E NOT DETECTED NOT DETECTED Final    Comment: (NOTE) The Coronavirus on the Respiratory Panel, DOES NOT test for the novel  Coronavirus (2019 nCoV)    Coronavirus HKU1 NOT DETECTED NOT DETECTED Final   Coronavirus NL63 NOT DETECTED NOT DETECTED Final   Coronavirus OC43 NOT DETECTED NOT DETECTED Final   Metapneumovirus NOT DETECTED NOT DETECTED Final   Rhinovirus / Enterovirus NOT DETECTED NOT DETECTED Final   Influenza A NOT DETECTED NOT DETECTED Final   Influenza B NOT DETECTED NOT DETECTED Final   Parainfluenza Virus 1 NOT DETECTED NOT DETECTED Final   Parainfluenza Virus 2 NOT DETECTED NOT DETECTED Final   Parainfluenza Virus 3 NOT DETECTED NOT DETECTED Final   Parainfluenza Virus 4 NOT  DETECTED NOT DETECTED Final   Respiratory Syncytial Virus NOT DETECTED NOT DETECTED Final   Bordetella pertussis NOT DETECTED NOT DETECTED Final   Bordetella Parapertussis NOT DETECTED NOT DETECTED Final   Chlamydophila pneumoniae NOT DETECTED NOT DETECTED Final   Mycoplasma pneumoniae NOT DETECTED NOT DETECTED Final    Comment: Performed at [facility] [place] [facility], [address], [place], [place] [address]  MRSA Next Gen by PCR, Nasal     Status: None   Collection Time: 09/27/23  4:12 PM   Specimen: Nasal Mucosa; Nasal Swab  Result Value Ref Range Status   MRSA by PCR Next Gen NOT DETECTED NOT DETECTED Final    Comment: (NOTE) The GeneXpert MRSA  Assay (FDA approved for NASAL specimens only), is one component of a comprehensive MRSA colonization surveillance program. It is not intended to diagnose MRSA infection nor to guide or monitor treatment for MRSA infections. Test performance is not FDA approved in patients less than 60 years old. Performed at [facility], [address] [place], [place] [address]     Labs: CBC: Recent Labs  Lab 09/27/23 1051 09/28/23 0513 09/29/23 0456  WBC 8.8 11.5* 13.5*  HGB 12.7* 11.9* 11.7*  HCT 40.1 38.2* 37.1*  MCV 85.3 86.8 86.1  PLT 420* 411* 415*   Basic Metabolic Panel: Recent Labs  Lab 09/27/23 1051 09/28/23 0513 09/29/23 0456  NA 136 137 138  K 3.7 4.2 3.8  CL 105 103 103  CO2 22 25 25   GLUCOSE 99 121* 130*  BUN 18 21* 20  CREATININE 0.77 0.83 1.00  CALCIUM  8.6* 8.4* 8.7*  MG  --  2.6* 2.3  PHOS  --  3.4 3.3   Liver Function Tests: No results for input(s): AST, ALT, ALKPHOS, BILITOT, PROT, ALBUMIN in the last 168 hours. CBG: Recent Labs  Lab 09/27/23 1559  GLUCAP 140*    Discharge time spent: greater than 30 minutes.  Signed: Charlie Patterson, MD [facility] 09/30/2023

## 2023-09-30 NOTE — TOC Transition Note (Signed)
 Transition of Care [facility]) - Discharge Note   Patient Details  Name: Justin Reid MRN: [id number] Date of Birth: 01-01-1981  [facility]) CM/SW Contact:  Lauraine JAYSON Carpen, LCSW Phone Number: 09/30/2023, 11:31 AM   Clinical Narrative: Patient has orders to discharge home today. No further concerns. CSW signing off.  Final next level of care: Home/Self Care Barriers to Discharge: Barriers Resolved   Patient Goals and CMS Choice            Discharge Placement                    Patient and family notified of of transfer: 09/30/23  Discharge Plan and Services Additional resources added to the After Visit Summary for                                       Social Drivers of Health (SDOH) Interventions SDOH Screenings   Food Insecurity: No Food Insecurity (09/27/2023)  Housing: Low Risk  (09/27/2023)  Transportation Needs: No Transportation Needs (09/27/2023)  Utilities: Not At Risk (09/27/2023)  Social Connections: Moderately Isolated (05/30/2023)  Tobacco Use: Medium Risk (09/27/2023)     Readmission Risk Interventions    04/03/2023   12:24 PM 03/08/2023    1:02 PM  Readmission Risk Prevention Plan  Transportation Screening Complete Complete  PCP or Specialist Appt within 3-5 Days Complete --  HRI or Home Care Consult Complete Complete  Social Work Consult for Recovery Care Planning/Counseling Complete Complete  Palliative Care Screening Not Applicable Not Applicable  Medication Review Oceanographer) Complete Referral to Pharmacy

## 2023-10-13 DIAGNOSIS — J4550 Severe persistent asthma, uncomplicated: Secondary | ICD-10-CM | POA: Diagnosis not present

## 2023-10-13 DIAGNOSIS — M170 Bilateral primary osteoarthritis of knee: Secondary | ICD-10-CM | POA: Diagnosis not present

## 2023-10-13 DIAGNOSIS — Z1331 Encounter for screening for depression: Secondary | ICD-10-CM | POA: Diagnosis not present

## 2023-10-13 DIAGNOSIS — M545 Low back pain, unspecified: Secondary | ICD-10-CM | POA: Diagnosis not present

## 2023-10-13 DIAGNOSIS — I10 Essential (primary) hypertension: Secondary | ICD-10-CM | POA: Diagnosis not present

## 2023-10-13 DIAGNOSIS — Z131 Encounter for screening for diabetes mellitus: Secondary | ICD-10-CM | POA: Diagnosis not present

## 2023-10-13 DIAGNOSIS — Z13220 Encounter for screening for lipoid disorders: Secondary | ICD-10-CM | POA: Diagnosis not present

## 2023-10-13 DIAGNOSIS — G4733 Obstructive sleep apnea (adult) (pediatric): Secondary | ICD-10-CM | POA: Diagnosis not present

## 2023-10-13 DIAGNOSIS — Z0131 Encounter for examination of blood pressure with abnormal findings: Secondary | ICD-10-CM | POA: Diagnosis not present

## 2023-10-13 DIAGNOSIS — Z1389 Encounter for screening for other disorder: Secondary | ICD-10-CM | POA: Diagnosis not present

## 2023-10-17 ENCOUNTER — Ambulatory Visit: Payer: Self-pay | Admitting: Pulmonary Disease

## 2023-10-19 DIAGNOSIS — M170 Bilateral primary osteoarthritis of knee: Secondary | ICD-10-CM | POA: Diagnosis not present

## 2023-10-24 ENCOUNTER — Other Ambulatory Visit: Payer: Self-pay

## 2023-10-24 ENCOUNTER — Emergency Department
Admission: EM | Admit: 2023-10-24 | Discharge: 2023-10-24 | Disposition: A | Discharge status: Home / Self Care | Attending: Emergency Medicine | Admitting: Emergency Medicine

## 2023-10-24 DIAGNOSIS — M5431 Sciatica, right side: Secondary | ICD-10-CM

## 2023-10-24 DIAGNOSIS — J45909 Unspecified asthma, uncomplicated: Secondary | ICD-10-CM | POA: Insufficient documentation

## 2023-10-24 DIAGNOSIS — X500XXA Overexertion from strenuous movement or load, initial encounter: Secondary | ICD-10-CM | POA: Insufficient documentation

## 2023-10-24 DIAGNOSIS — S39012A Strain of muscle, fascia and tendon of lower back, initial encounter: Secondary | ICD-10-CM | POA: Insufficient documentation

## 2023-10-24 DIAGNOSIS — I10 Essential (primary) hypertension: Secondary | ICD-10-CM | POA: Diagnosis not present

## 2023-10-24 DIAGNOSIS — S3992XA Unspecified injury of lower back, initial encounter: Secondary | ICD-10-CM | POA: Diagnosis present

## 2023-10-24 DIAGNOSIS — M5441 Lumbago with sciatica, right side: Secondary | ICD-10-CM | POA: Diagnosis not present

## 2023-10-24 MED ORDER — LIDOCAINE 5 % EX PTCH: 1.0000 | MEDICATED_PATCH | Freq: Two times a day (BID) | CUTANEOUS | 1 refills | Status: AC

## 2023-10-24 MED ORDER — ACETAMINOPHEN 500 MG PO TABS
1000.0000 mg | ORAL_TABLET | Freq: Once | ORAL | Status: AC
  Administered 2023-10-24: 1000 mg via ORAL
  Filled 2023-10-24: qty 2

## 2023-10-24 MED ORDER — METHOCARBAMOL 500 MG PO TABS: 500.0000 mg | ORAL_TABLET | Freq: Three times a day (TID) | ORAL | 0 refills | Status: AC | PRN

## 2023-10-24 MED ORDER — METHOCARBAMOL 500 MG PO TABS
500.0000 mg | ORAL_TABLET | Freq: Once | ORAL | Status: AC
  Administered 2023-10-24: 500 mg via ORAL
  Filled 2023-10-24: qty 1

## 2023-10-24 MED ORDER — KETOROLAC TROMETHAMINE 30 MG/ML IJ SOLN
30.0000 mg | Freq: Once | INTRAMUSCULAR | Status: AC
  Administered 2023-10-24: 30 mg via INTRAMUSCULAR
  Filled 2023-10-24: qty 1

## 2023-10-24 MED ORDER — METHOCARBAMOL 500 MG PO TABS: 500.0000 mg | ORAL_TABLET | Freq: Three times a day (TID) | ORAL | 0 refills | Status: DC | PRN

## 2023-10-24 MED ORDER — LIDOCAINE 5 % EX PTCH: 1.0000 | MEDICATED_PATCH | Freq: Two times a day (BID) | CUTANEOUS | 1 refills | Status: DC

## 2023-10-24 MED ORDER — LIDOCAINE 5 % EX PTCH
2.0000 | MEDICATED_PATCH | CUTANEOUS | Status: DC
  Administered 2023-10-24: 2 via TRANSDERMAL
  Filled 2023-10-24: qty 2

## 2023-10-24 NOTE — ED Provider Notes (Signed)
 [facility] Provider Note    Event Date/Time   First MD Initiated Contact with Patient 10/24/23 1640     (approximate)   History   No chief complaint on file.   HPI  Justin Reid is a 43 y.o. male who presents to the ED for evaluation of No chief complaint on file.   I reviewed medical DC summary from 4 weeks ago.  Admitted for severe asthma exacerbation.  Otherwise history of morbid obesity, HTN.  Patient presents with low back pain over the past 24 hours since moving furniture yesterday.  Denies any discrete trauma or falls but does report a twisting mechanism while lifting a heavy weight that precipitated much of his pain, and it has seemed to worsen over the past 12-24 hours.  Denies shortness of breath, syncope  No weakness to the legs, saddle anesthesias   Physical Exam   Triage Vital Signs: ED Triage Vitals  Encounter Vitals Group     BP 10/24/23 1441 (!) 157/100     Girls Systolic BP Percentile --      Girls Diastolic BP Percentile --      Boys Systolic BP Percentile --      Boys Diastolic BP Percentile --      Pulse Rate 10/24/23 1441 86     Resp 10/24/23 1441 18     Temp 10/24/23 1441 97.8 F (36.6 C)     Temp src --      SpO2 10/24/23 1441 98 %     Weight 10/24/23 1442 (!) 320 lb (145.2 kg)     Height 10/24/23 1442 5' 10 (1.778 m)     Head Circumference --      Peak Flow --      Pain Score 10/24/23 1442 10     Pain Loc --      Pain Education --      Exclude from Growth Chart --     Most recent vital signs: Vitals:   10/24/23 1441  BP: (!) 157/100  Pulse: 86  Resp: 18  Temp: 97.8 F (36.6 C)  SpO2: 98%    General: Awake, no distress.  CV:  Good peripheral perfusion.  Resp:  Normal effort.  Abd:  No distention.  MSK:  No deformity noted.  Right sided paraspinal tenderness without overlying skin changes or signs of trauma.  No midline spinal tenderness. Neuro:  No focal deficits appreciated. Other:  Ambulatory  with a slightly antalgic gait   ED Results / Procedures / Treatments   Labs (all labs ordered are listed, but only abnormal results are displayed) Labs Reviewed - No data to display  EKG   RADIOLOGY   Official radiology report(s): No results found.  PROCEDURES and INTERVENTIONS:  Procedures  Medications  lidocaine  (LIDODERM ) 5 % 2 patch (2 patches Transdermal Patch Applied 10/24/23 1656)  acetaminophen  (TYLENOL ) tablet 1,000 mg (1,000 mg Oral Given 10/24/23 1657)  ketorolac  (TORADOL ) 30 MG/ML injection 30 mg (30 mg Intramuscular Given 10/24/23 1657)  methocarbamol  (ROBAXIN ) tablet 500 mg (500 mg Oral Given 10/24/23 1657)     IMPRESSION / MDM / ASSESSMENT AND PLAN / ED COURSE  I reviewed the triage vital signs and the nursing notes.  Differential diagnosis includes, but is not limited to, cauda equina, muscular spasm, slipped disc  {Patient presents with symptoms of an acute illness or injury that is potentially life-threatening.  Patient presents with atraumatic low back pain without red flag features suitable for outpatient  management with multimodal nonnarcotic analgesia.  No indications for imaging.  Clinical Course as of 10/24/23 1802  Mon Oct 24, 2023  1801 Patient feeling better and requesting discharge [DS]    Clinical Course User Index [DS] Claudene Rover, MD     FINAL CLINICAL IMPRESSION(S) / ED DIAGNOSES   Final diagnoses:  Strain of lumbar region, initial encounter  Sciatica of right side     Rx / DC Orders   ED Discharge Orders          Ordered    methocarbamol  (ROBAXIN ) 500 MG tablet  Every 8 hours PRN        10/24/23 1801    lidocaine  (LIDODERM ) 5 %  Every 12 hours        10/24/23 1801             Note:  This document was prepared using Dragon voice recognition software and may include unintentional dictation errors.   Claudene Rover, MD 10/24/23 [phone]

## 2023-10-24 NOTE — ED Triage Notes (Signed)
 Pt comes in via pov with complaints of back pain. Pt states that he was moving furniture yesterday, when he twisted the wrong way. Pt complains of lower back pain 10/10.  Pt has a history of hypertension, but has not had his medication today.

## 2023-10-24 NOTE — ED Notes (Signed)
 See triage note  Presents with lower back pain  States he developed pain after moving furniture yesterday Denies any fall but thinks he twisted wrong Ambulates well

## 2023-10-24 NOTE — Discharge Instructions (Addendum)
Please take Tylenol and ibuprofen/Advil for your pain.  It is safe to take them together, or to alternate them every few hours.  Take up to 1000mg  of Tylenol at a time, up to 4 times per day.  Do not take more than 4000 mg of Tylenol in 24 hours.  For ibuprofen, take 400-600 mg, 3 - 4 times per day.  Use Robaxin muscle relaxer as needed for more severe/breakthrough pain, up to 3 times per day. This medication can make some people sleepy, so do not use while driving, working or operating machinery  Please use lidocaine patches at your site of pain.  Apply 1 patch at a time, leave on for 12 hours, then remove for 12 hours.  12 hours on, 12 hours off.  Do not apply more than 1 patch at a time.

## 2023-11-02 ENCOUNTER — Telehealth: Payer: Self-pay | Admitting: *Deleted

## 2023-11-02 NOTE — Progress Notes (Unsigned)
 Complex Care Management Note Care Guide Note  11/02/2023 Name: Justin Reid MRN: [id number] DOB: 1980-11-10   Complex Care Management Outreach Attempts: An unsuccessful telephone outreach was attempted today to offer the patient information about available complex care management services.  Follow Up Plan:  Additional outreach attempts will be made to offer the patient complex care management information and services.   Encounter Outcome:  No Answer  Thedford Franks, CMA [place]  [facility], [facility] Guide Direct Dial: [phone]  Fax: [phone] Website: [email or web address]

## 2023-11-03 NOTE — Progress Notes (Unsigned)
 Complex Care Management Note Care Guide Note  11/03/2023 Name: Justin Reid MRN: [id number] DOB: November 02, 1980   Complex Care Management Outreach Attempts: A second unsuccessful outreach was attempted today to offer the patient with information about available complex care management services.  Follow Up Plan:  Additional outreach attempts will be made to offer the patient complex care management information and services.   Encounter Outcome:  No Answer  Thedford Franks, CMA [place]  [facility], [facility] Guide Direct Dial: [phone]  Fax: [phone] Website: [email or web address]

## 2023-11-04 ENCOUNTER — Other Ambulatory Visit: Payer: Self-pay | Admitting: *Deleted

## 2023-11-04 DIAGNOSIS — J4541 Moderate persistent asthma with (acute) exacerbation: Secondary | ICD-10-CM

## 2023-11-04 NOTE — Progress Notes (Unsigned)
 Orders

## 2023-11-04 NOTE — Progress Notes (Signed)
 Complex Care Management Note Care Guide Note  11/04/2023 Name: Justin Reid MRN: [id number] DOB: Oct 22, 1980   Complex Care Management Outreach Attempts: A third unsuccessful outreach was attempted today to offer the patient with information about available complex care management services.  Follow Up Plan:  No further outreach attempts will be made at this time. We have been unable to contact the patient to offer or enroll patient in complex care management services.  Encounter Outcome:  No Answer  Thedford Franks, CMA [place]  [facility], [facility] Guide Direct Dial: [phone]  Fax: [phone] Website: [email or web address]

## 2023-11-17 ENCOUNTER — Other Ambulatory Visit: Payer: Self-pay

## 2023-11-17 ENCOUNTER — Emergency Department: Admission: EM | Admit: 2023-11-17 | Discharge: 2023-11-17 | Disposition: A | Discharge status: Home / Self Care

## 2023-11-17 ENCOUNTER — Emergency Department

## 2023-11-17 ENCOUNTER — Encounter: Payer: Self-pay | Admitting: Emergency Medicine

## 2023-11-17 DIAGNOSIS — J4541 Moderate persistent asthma with (acute) exacerbation: Secondary | ICD-10-CM | POA: Insufficient documentation

## 2023-11-17 DIAGNOSIS — R0602 Shortness of breath: Secondary | ICD-10-CM | POA: Diagnosis not present

## 2023-11-17 LAB — CBC
HCT: 40.6 % (ref 39.0–52.0)
Hemoglobin: 13.2 g/dL (ref 13.0–17.0)
MCH: 27.7 pg (ref 26.0–34.0)
MCHC: 32.5 g/dL (ref 30.0–36.0)
MCV: 85.1 fL (ref 80.0–100.0)
Platelets: 388 K/uL (ref 150–400)
RBC: 4.77 MIL/uL (ref 4.22–5.81)
RDW: 15 % (ref 11.5–15.5)
WBC: 8.6 K/uL (ref 4.0–10.5)
nRBC: 0 % (ref 0.0–0.2)

## 2023-11-17 LAB — BASIC METABOLIC PANEL WITH GFR
Anion gap: 12 (ref 5–15)
BUN: 16 mg/dL (ref 6–20)
CO2: 22 mmol/L (ref 22–32)
Calcium: 8.8 mg/dL — ABNORMAL LOW (ref 8.9–10.3)
Chloride: 103 mmol/L (ref 98–111)
Creatinine, Ser: 0.72 mg/dL (ref 0.61–1.24)
GFR, Estimated: >60 mL/min (ref >60)
Glucose, Bld: 108 mg/dL — ABNORMAL HIGH (ref 70–99)
Potassium: 4 mmol/L (ref 3.5–5.1)
Sodium: 136 mmol/L (ref 135–145)

## 2023-11-17 MED ORDER — PREDNISONE 10 MG PO TABS: 50.0000 mg | ORAL_TABLET | Freq: Every day | ORAL | 0 refills | Status: AC

## 2023-11-17 MED ORDER — IPRATROPIUM-ALBUTEROL 0.5-2.5 (3) MG/3ML IN SOLN
3.0000 mL | Freq: Once | RESPIRATORY_TRACT | Status: AC
  Administered 2023-11-17: 3 mL via RESPIRATORY_TRACT
  Filled 2023-11-17: qty 3

## 2023-11-17 MED ORDER — PREDNISONE 20 MG PO TABS
60.0000 mg | ORAL_TABLET | Freq: Once | ORAL | Status: AC
  Administered 2023-11-17: 60 mg via ORAL
  Filled 2023-11-17: qty 3

## 2023-11-17 MED ORDER — PREDNISONE 10 MG (21) PO TBPK: ORAL_TABLET | ORAL | 0 refills | Status: DC

## 2023-11-17 MED ORDER — IPRATROPIUM-ALBUTEROL 0.5-2.5 (3) MG/3ML IN SOLN
7.5000 mL | Freq: Once | RESPIRATORY_TRACT | Status: AC
  Administered 2023-11-17: 7.5 mL via RESPIRATORY_TRACT
  Filled 2023-11-17: qty 3

## 2023-11-17 MED ORDER — DIPHENHYDRAMINE HCL 25 MG PO CAPS
25.0000 mg | ORAL_CAPSULE | Freq: Once | ORAL | Status: AC
  Administered 2023-11-17: 25 mg via ORAL
  Filled 2023-11-17: qty 1

## 2023-11-17 MED ORDER — ALBUTEROL SULFATE HFA 108 (90 BASE) MCG/ACT IN AERS: 2.0000 | INHALATION_SPRAY | Freq: Four times a day (QID) | RESPIRATORY_TRACT | 2 refills | Status: DC | PRN

## 2023-11-17 NOTE — ED Provider Notes (Signed)
 [facility] Provider Note    Event Date/Time   First MD Initiated Contact with Patient 11/17/23 0901     (approximate)   History   Shortness of Breath   HPI  Justin Reid is a 43 y.o. male shortness of breath.  He has an underlying history of asthma, frequent presentations for Emergency Department for similar symptoms, seems that he recently ran out of his rescue inhaler, he uses nebulizer at home without much relief of symptoms and presents now for further assessment and evaluation.  Feels like he is having a asthma exacerbation.      Physical Exam   Triage Vital Signs: ED Triage Vitals  Encounter Vitals Group     BP 11/17/23 0849 (!) 151/101     Girls Systolic BP Percentile --      Girls Diastolic BP Percentile --      Boys Systolic BP Percentile --      Boys Diastolic BP Percentile --      Pulse Rate 11/17/23 0849 99     Resp 11/17/23 0849 (!) 28     Temp 11/17/23 0849 97.9 F (36.6 C)     Temp Source 11/17/23 0849 Oral     SpO2 11/17/23 0849 99 %     Weight 11/17/23 0847 (!) 320 lb (145.2 kg)     Height 11/17/23 0847 5' 10 (1.778 m)     Head Circumference --      Peak Flow --      Pain Score 11/17/23 0847 10     Pain Loc --      Pain Education --      Exclude from Growth Chart --     Most recent vital signs: Vitals:   11/17/23 0849 11/17/23 1154  BP: (!) 151/101 (!) 145/95  Pulse: 99 99  Resp: (!) 28 20  Temp: 97.9 F (36.6 C) 98.4 F (36.9 C)  SpO2: 99% 99%     General: Awake, no distress.  CV:  Good peripheral perfusion.  Regular rate and rhythm Resp:  Diffuse wheezes and increased work of breathing noted on respiratory exam Abd:  No distention.  Soft nontender Other:     ED Results / Procedures / Treatments   Labs (all labs ordered are listed, but only abnormal results are displayed) Labs Reviewed  BASIC METABOLIC PANEL WITH GFR - Abnormal; Notable for the following components:      Result Value   Glucose,  Bld 108 (*)    Calcium  8.8 (*)    All other components within normal limits  CBC     EKG  Sinus rhythm with rate of about 95, axis of -30, intervals appear to be within normal limits and no obvious ischemia   RADIOLOGY No acute findings on chest x-ray  PROCEDURES:  Critical Care performed: Yes, see critical care procedure note(s)  .Critical Care  Performed by: Fernand Rossie HERO, MD Authorized by: Fernand Rossie HERO, MD   Critical care provider statement:    Critical care time (minutes):  35   Critical care was necessary to treat or prevent imminent or life-threatening deterioration of the following conditions:  Respiratory failure   Critical care was time spent personally by me on the following activities:  Ordering and performing treatments and interventions, pulse oximetry, re-evaluation of patient's condition, examination of patient, review of old charts, ordering and review of radiographic studies and ordering and review of laboratory studies   I assumed direction of critical care  for this patient from another provider in my specialty: no     Care discussed with: admitting provider      MEDICATIONS ORDERED IN ED: Medications  ipratropium-albuterol  (DUONEB) 0.5-2.5 (3) MG/3ML nebulizer solution 3 mL (3 mLs Nebulization Given 11/17/23 0927)  predniSONE  (DELTASONE ) tablet 60 mg (60 mg Oral Given 11/17/23 1025)  ipratropium-albuterol  (DUONEB) 0.5-2.5 (3) MG/3ML nebulizer solution 7.5 mL (7.5 mLs Nebulization Given 11/17/23 1025)  diphenhydrAMINE  (BENADRYL ) capsule 25 mg (25 mg Oral Given 11/17/23 1025)     IMPRESSION / MDM / ASSESSMENT AND PLAN / ED COURSE  I reviewed the triage vital signs and the nursing notes.                               Patient's presentation is most consistent with acute presentation with potential threat to life or bodily function.  43 year old male presenting with concern of an asthma exacerbation.  Does seem to have some increased work of  breathing although saturating appropriately on room air.  Unfortunately has run out of his outpatient albuterol  inhaler.  Will give him nebulizer here and continue monitor closely, determine safe disposition accordingly.   Clinical Course as of 11/17/23 1549  Thu Nov 17, 2023  1014 Patient completed 1 nebulizer continues to have symptoms despite this.  Will give him a continuous neb, has not received steroids yet he is complaining of diffuse itching so we will add on Benadryl  as well while we await response to treatment and therapy. [SK]  1049 Patient work of breathing has improved, will monitor him here briefly as he just completed the second nebulizer, if he continues to appear well plan for discharge home with course of steroids and albuterol  for home.  I did consider admission and discussed this with the patient, however patient would like to be discharged if possible. [SK]    Clinical Course User Index [SK] Fernand Rossie HERO, MD     FINAL CLINICAL IMPRESSION(S) / ED DIAGNOSES   Final diagnoses:  Moderate persistent asthma with exacerbation     Rx / DC Orders   ED Discharge Orders          Ordered    albuterol  (VENTOLIN  HFA) 108 (90 Base) MCG/ACT inhaler  Every 6 hours PRN        11/17/23 1139    predniSONE  (STERAPRED UNI-PAK 21 TAB) 10 MG (21) TBPK tablet  Status:  Discontinued        11/17/23 1139    predniSONE  (STERAPRED UNI-PAK 21 TAB) 10 MG (21) TBPK tablet  Status:  Discontinued        11/17/23 1144    predniSONE  (DELTASONE ) 10 MG tablet  Daily        11/17/23 1149             Note:  This document was prepared using Dragon voice recognition software and may include unintentional dictation errors.   Fernand Rossie HERO, MD 11/17/23 [phone]

## 2023-11-17 NOTE — ED Notes (Signed)
 Pt given DC instructions. Pt verbalized understanding of medications and follow up care. Pt ambulatory from ED without difficulty.

## 2023-11-17 NOTE — TOC Initial Note (Signed)
 Transition of Care [facility]) - Initial/Assessment Note    Patient Details  Name: Justin Reid MRN: [id number] Date of Birth: 08-29-1980  Transition of [facility]) CM/SW Contact:    Therron Sells L Willliam Pettet, LCSW Phone Number: 11/17/2023, 9:01 AM  Clinical Narrative:                  TOC alerted to ED High Utilizer status. CSW added PCP/free and low cost clinic  resources to the AVS.        Patient Goals and CMS Choice            Expected Discharge Plan and Services                                              Prior Living Arrangements/Services                       Activities of Daily Living      Permission Sought/Granted                  Emotional Assessment              Admission diagnosis:  Shortness of Breath Patient Active Problem List   Diagnosis Date Noted   Status asthmaticus 09/27/2023   Chronic pain 05/31/2023   Cough 05/30/2023   Bilateral knee effusions 04/29/2023   Non-cardiac chest pain 04/20/2023   Other chest pain 04/09/2023   GERD (gastroesophageal reflux disease) 04/09/2023   Vocal cord dysfunction 04/01/2023   Obesity, Class III, BMI 40-49.9 (morbid obesity) (HCC) 02/21/2023   Tobacco use 02/21/2023   Acute hypoxic respiratory failure (HCC) 01/20/2023   Essential hypertension 01/20/2023   Tobacco use disorder 12/28/2021   OSA (obstructive sleep apnea) 12/28/2021   At risk for sleep apnea 11/10/2020   Obesity hypoventilation syndrome (HCC) 11/10/2020   Truncal obesity 11/10/2020   Acute severe exacerbation of severe persistent asthma (HCC) 02/20/2016   PCP:  Freddrick No Pharmacy:   [facility] [address], [place] - [address] [place] [address] Phone: [phone] Fax: [phone]  [facility] - [place], [place] - [address] [place] [address] Phone: [phone] Fax: [phone]     Social Drivers of Health (SDOH) Social  History: SDOH Screenings   Food Insecurity: No Food Insecurity (09/27/2023)  Housing: Low Risk  (09/27/2023)  Transportation Needs: No Transportation Needs (09/27/2023)  Utilities: Not At Risk (09/27/2023)  Social Connections: Moderately Isolated (05/30/2023)  Tobacco Use: Medium Risk (11/17/2023)   SDOH Interventions:     Readmission Risk Interventions    04/03/2023   12:24 PM 03/08/2023    1:02 PM  Readmission Risk Prevention Plan  Transportation Screening Complete Complete  PCP or Specialist Appt within 3-5 Days Complete --  HRI or Home Care Consult Complete Complete  Social Work Consult for Recovery Care Planning/Counseling Complete Complete  Palliative Care Screening Not Applicable Not Applicable  Medication Review Ocean[facility]) Complete Referral to Pharmacy

## 2023-11-17 NOTE — Discharge Instructions (Addendum)
 I have written for your home albuterol  inhaler, please be sure to pick this up and take this as instructed.  I have also written for a brief course of steroids for you to take, please take these as instructed.  If you have any worsening of symptoms such as increased shortness of breath, chest pain, or any other symptoms you find concerning please return to the emergency department immediately for further medical management.  [facility]: Primary Care Provider   Address: [address], [place], [place] [address]  Phone: [phone]   [place]: A non-profit clinic providing free comprehensive health care services -- including general medical care, eye clinic, dental clinic, pharmacy and mental?health services -- to uninsured adults in [place], [place].   [facility] Address: [address] [place], [place], [place] [address]  Phone: [phone]  Services: Offers free/confidential STI/HIV testing, and many services targeted at uninsured or under-insured residents.   Note: They run a mobile clinic unit as well and serve [employer] locations

## 2023-11-17 NOTE — ED Triage Notes (Signed)
 Pt via POV from home. Pt c/o SOB since last night. Pt has a hx of asthma but states he ran out of his rescue inhaler. States he did 1 albuterol  neb this morning with no relief. Pt c/o chronic lower back pain but denies any other pain. Pt is A&Ox4 but expiratory wheezing noted and labored breathing.

## 2023-11-24 DIAGNOSIS — Z0131 Encounter for examination of blood pressure with abnormal findings: Secondary | ICD-10-CM | POA: Diagnosis not present

## 2023-11-24 DIAGNOSIS — Z1389 Encounter for screening for other disorder: Secondary | ICD-10-CM | POA: Diagnosis not present

## 2023-11-24 DIAGNOSIS — G4733 Obstructive sleep apnea (adult) (pediatric): Secondary | ICD-10-CM | POA: Diagnosis not present

## 2023-11-24 DIAGNOSIS — J4550 Severe persistent asthma, uncomplicated: Secondary | ICD-10-CM | POA: Diagnosis not present

## 2023-11-24 DIAGNOSIS — M170 Bilateral primary osteoarthritis of knee: Secondary | ICD-10-CM | POA: Diagnosis not present

## 2023-11-24 DIAGNOSIS — Z23 Encounter for immunization: Secondary | ICD-10-CM | POA: Diagnosis not present

## 2023-11-24 DIAGNOSIS — I10 Essential (primary) hypertension: Secondary | ICD-10-CM | POA: Diagnosis not present

## 2023-11-24 DIAGNOSIS — Z0000 Encounter for general adult medical examination without abnormal findings: Secondary | ICD-10-CM | POA: Diagnosis not present

## 2023-12-05 DIAGNOSIS — M25561 Pain in right knee: Secondary | ICD-10-CM | POA: Diagnosis not present

## 2023-12-05 DIAGNOSIS — M170 Bilateral primary osteoarthritis of knee: Secondary | ICD-10-CM | POA: Diagnosis not present

## 2023-12-05 DIAGNOSIS — M545 Low back pain, unspecified: Secondary | ICD-10-CM | POA: Diagnosis not present

## 2023-12-05 DIAGNOSIS — M25562 Pain in left knee: Secondary | ICD-10-CM | POA: Diagnosis not present

## 2023-12-06 ENCOUNTER — Encounter: Payer: Self-pay | Admitting: Internal Medicine

## 2023-12-08 DIAGNOSIS — M5416 Radiculopathy, lumbar region: Secondary | ICD-10-CM | POA: Diagnosis not present

## 2023-12-08 DIAGNOSIS — M7918 Myalgia, other site: Secondary | ICD-10-CM | POA: Diagnosis not present

## 2023-12-08 DIAGNOSIS — M170 Bilateral primary osteoarthritis of knee: Secondary | ICD-10-CM | POA: Diagnosis not present

## 2023-12-08 DIAGNOSIS — M545 Low back pain, unspecified: Secondary | ICD-10-CM | POA: Diagnosis not present

## 2023-12-13 ENCOUNTER — Emergency Department: Payer: MEDICAID

## 2023-12-13 ENCOUNTER — Encounter: Payer: Self-pay | Admitting: Internal Medicine

## 2023-12-13 ENCOUNTER — Other Ambulatory Visit: Payer: Self-pay

## 2023-12-13 ENCOUNTER — Observation Stay: Admission: EM | Admit: 2023-12-13 | Discharge: 2023-12-14 | Payer: MEDICAID

## 2023-12-13 DIAGNOSIS — J4551 Severe persistent asthma with (acute) exacerbation: Secondary | ICD-10-CM | POA: Diagnosis not present

## 2023-12-13 DIAGNOSIS — I10 Essential (primary) hypertension: Secondary | ICD-10-CM | POA: Diagnosis present

## 2023-12-13 DIAGNOSIS — G4733 Obstructive sleep apnea (adult) (pediatric): Secondary | ICD-10-CM | POA: Diagnosis present

## 2023-12-13 DIAGNOSIS — E66813 Obesity, class 3: Secondary | ICD-10-CM | POA: Diagnosis present

## 2023-12-13 DIAGNOSIS — K219 Gastro-esophageal reflux disease without esophagitis: Secondary | ICD-10-CM | POA: Diagnosis present

## 2023-12-13 DIAGNOSIS — J45901 Unspecified asthma with (acute) exacerbation: Principal | ICD-10-CM | POA: Diagnosis present

## 2023-12-13 LAB — CBC WITH DIFFERENTIAL/PLATELET
Abs Immature Granulocytes: 0.03 K/uL (ref 0.00–0.07)
Basophils Absolute: 0 K/uL (ref 0.0–0.1)
Basophils Relative: 0 %
Eosinophils Absolute: 0.3 K/uL (ref 0.0–0.5)
Eosinophils Relative: 3 %
HCT: 41.6 % (ref 39.0–52.0)
Hemoglobin: 12.8 g/dL — ABNORMAL LOW (ref 13.0–17.0)
Immature Granulocytes: 0 %
Lymphocytes Relative: 27 %
Lymphs Abs: 2.5 K/uL (ref 0.7–4.0)
MCH: 26.8 pg (ref 26.0–34.0)
MCHC: 30.8 g/dL (ref 30.0–36.0)
MCV: 87.2 fL (ref 80.0–100.0)
Monocytes Absolute: 0.4 K/uL (ref 0.1–1.0)
Monocytes Relative: 4 %
Neutro Abs: 5.9 K/uL (ref 1.7–7.7)
Neutrophils Relative %: 66 %
Platelets: 409 K/uL — ABNORMAL HIGH (ref 150–400)
RBC: 4.77 MIL/uL (ref 4.22–5.81)
RDW: 14.8 % (ref 11.5–15.5)
WBC: 9.1 K/uL (ref 4.0–10.5)
nRBC: 0 % (ref 0.0–0.2)

## 2023-12-13 LAB — BASIC METABOLIC PANEL WITH GFR
Anion gap: 12 (ref 5–15)
BUN: 15 mg/dL (ref 6–20)
CO2: 26 mmol/L (ref 22–32)
Calcium: 9.5 mg/dL (ref 8.9–10.3)
Chloride: 99 mmol/L (ref 98–111)
Creatinine, Ser: 0.8 mg/dL (ref 0.61–1.24)
GFR, Estimated: >60 mL/min (ref >60)
Glucose, Bld: 114 mg/dL — ABNORMAL HIGH (ref 70–99)
Potassium: 4.2 mmol/L (ref 3.5–5.1)
Sodium: 137 mmol/L (ref 135–145)

## 2023-12-13 LAB — BLOOD GAS, VENOUS
Acid-Base Excess: 8 mmol/L — ABNORMAL HIGH (ref 0.0–2.0)
Acid-Base Excess: 7 mmol/L — ABNORMAL HIGH (ref 0.0–2.0)
Bicarbonate: 33.9 mmol/L — ABNORMAL HIGH (ref 20.0–28.0)
Bicarbonate: 31.3 mmol/L — ABNORMAL HIGH (ref 20.0–28.0)
O2 Saturation: 74.6 %
O2 Saturation: 97.7 %
Patient temperature: 37
Patient temperature: 37
pCO2, Ven: 51 mmHg (ref 44–60)
pCO2, Ven: 42 mmHg — ABNORMAL LOW (ref 44–60)
pH, Ven: 7.43 (ref 7.25–7.43)
pH, Ven: 7.48 — ABNORMAL HIGH (ref 7.25–7.43)
pO2, Ven: 44 mmHg (ref 32–45)
pO2, Ven: 80 mmHg — ABNORMAL HIGH (ref 32–45)

## 2023-12-13 LAB — BLOOD GAS, ARTERIAL
Acid-Base Excess: 5.4 mmol/L — ABNORMAL HIGH (ref 0.0–2.0)
Bicarbonate: 29.9 mmol/L — ABNORMAL HIGH (ref 20.0–28.0)
Delivery systems: POSITIVE
Expiratory PAP: 6 cmH2O
FIO2: 35 %
Inspiratory PAP: 12 cmH2O
Mechanical Rate: 16
O2 Saturation: 99.1 %
Patient temperature: 37
pCO2 arterial: 42 mmHg (ref 32–48)
pH, Arterial: 7.46 — ABNORMAL HIGH (ref 7.35–7.45)
pO2, Arterial: 101 mmHg (ref 83–108)

## 2023-12-13 LAB — GLUCOSE, CAPILLARY: Glucose-Capillary: 120 mg/dL — ABNORMAL HIGH (ref 70–99)

## 2023-12-13 LAB — RESP PANEL BY RT-PCR (RSV, FLU A&B, COVID)  RVPGX2
Influenza A by PCR: NEGATIVE
Influenza B by PCR: NEGATIVE
Resp Syncytial Virus by PCR: NEGATIVE
SARS Coronavirus 2 by RT PCR: NEGATIVE

## 2023-12-13 LAB — MRSA NEXT GEN BY PCR, NASAL: MRSA by PCR Next Gen: NOT DETECTED

## 2023-12-13 LAB — MAGNESIUM: Magnesium: 2.2 mg/dL (ref 1.7–2.4)

## 2023-12-13 MED ORDER — SODIUM CHLORIDE 0.9% FLUSH
3.0000 mL | Freq: Two times a day (BID) | INTRAVENOUS | Status: DC
  Administered 2023-12-13 (×2): 3 mL via INTRAVENOUS

## 2023-12-13 MED ORDER — HYDRALAZINE HCL 20 MG/ML IJ SOLN
10.0000 mg | Freq: Once | INTRAMUSCULAR | Status: AC
  Administered 2023-12-13: 10 mg via INTRAVENOUS
  Filled 2023-12-13: qty 1

## 2023-12-13 MED ORDER — IPRATROPIUM-ALBUTEROL 0.5-2.5 (3) MG/3ML IN SOLN
3.0000 mL | Freq: Once | RESPIRATORY_TRACT | Status: AC
  Administered 2023-12-13: 3 mL via RESPIRATORY_TRACT
  Filled 2023-12-13: qty 3

## 2023-12-13 MED ORDER — ONDANSETRON HCL 4 MG/2ML IJ SOLN: 4.0000 mg | Freq: Four times a day (QID) | INTRAMUSCULAR | Status: DC | PRN

## 2023-12-13 MED ORDER — MAGNESIUM SULFATE 2 GM/50ML IV SOLN
2.0000 g | Freq: Once | INTRAVENOUS | Status: AC
  Administered 2023-12-13: 2 g via INTRAVENOUS
  Filled 2023-12-13: qty 50

## 2023-12-13 MED ORDER — DIPHENHYDRAMINE HCL 25 MG PO CAPS
25.0000 mg | ORAL_CAPSULE | Freq: Four times a day (QID) | ORAL | Status: DC | PRN
  Administered 2023-12-13: 25 mg via ORAL
  Filled 2023-12-13 (×2): qty 1

## 2023-12-13 MED ORDER — NAPROXEN 500 MG PO TABS
500.0000 mg | ORAL_TABLET | Freq: Two times a day (BID) | ORAL | Status: DC | PRN
  Administered 2023-12-14: 500 mg via ORAL
  Filled 2023-12-13 (×2): qty 1

## 2023-12-13 MED ORDER — LIDOCAINE 5 % EX PTCH
1.0000 | MEDICATED_PATCH | Freq: Every day | CUTANEOUS | Status: DC
  Administered 2023-12-13: 1 via TRANSDERMAL
  Filled 2023-12-13: qty 1

## 2023-12-13 MED ORDER — TRAMADOL HCL 50 MG PO TABS
50.0000 mg | ORAL_TABLET | Freq: Two times a day (BID) | ORAL | Status: DC | PRN
  Administered 2023-12-13: 50 mg via ORAL
  Filled 2023-12-13: qty 1

## 2023-12-13 MED ORDER — SODIUM CHLORIDE 0.9 % IV BOLUS
1000.0000 mL | Freq: Once | INTRAVENOUS | Status: AC
  Administered 2023-12-13: 1000 mL via INTRAVENOUS

## 2023-12-13 MED ORDER — SENNOSIDES-DOCUSATE SODIUM 8.6-50 MG PO TABS: 1.0000 | ORAL_TABLET | Freq: Every evening | ORAL | Status: DC | PRN

## 2023-12-13 MED ORDER — METHYLPREDNISOLONE SODIUM SUCC 125 MG IJ SOLR
125.0000 mg | Freq: Once | INTRAMUSCULAR | Status: AC
  Administered 2023-12-13: 125 mg via INTRAVENOUS
  Filled 2023-12-13: qty 2

## 2023-12-13 MED ORDER — ONDANSETRON HCL 4 MG PO TABS: 4.0000 mg | ORAL_TABLET | Freq: Four times a day (QID) | ORAL | Status: DC | PRN

## 2023-12-13 MED ORDER — ACETAMINOPHEN 325 MG PO TABS: 650.0000 mg | ORAL_TABLET | Freq: Four times a day (QID) | ORAL | Status: DC | PRN

## 2023-12-13 MED ORDER — PREDNISONE 10 MG PO TABS
40.0000 mg | ORAL_TABLET | Freq: Every day | ORAL | Status: DC
  Administered 2023-12-14: 40 mg via ORAL
  Filled 2023-12-13: qty 4

## 2023-12-13 MED ORDER — HEPARIN SODIUM (PORCINE) 5000 UNIT/ML IJ SOLN
5000.0000 [IU] | Freq: Three times a day (TID) | INTRAMUSCULAR | Status: DC
  Administered 2023-12-13 – 2023-12-14 (×3): 5000 [IU] via SUBCUTANEOUS
  Filled 2023-12-13 (×3): qty 1

## 2023-12-13 MED ORDER — METHOCARBAMOL 500 MG PO TABS
500.0000 mg | ORAL_TABLET | Freq: Three times a day (TID) | ORAL | Status: DC | PRN
  Administered 2023-12-13: 500 mg via ORAL
  Filled 2023-12-13 (×2): qty 1

## 2023-12-13 MED ORDER — ACETAMINOPHEN 650 MG RE SUPP: 650.0000 mg | Freq: Four times a day (QID) | RECTAL | Status: DC | PRN

## 2023-12-13 MED ORDER — LACTATED RINGERS IV SOLN: INTRAVENOUS | Status: AC

## 2023-12-13 MED ORDER — IPRATROPIUM-ALBUTEROL 0.5-2.5 (3) MG/3ML IN SOLN
3.0000 mL | Freq: Four times a day (QID) | RESPIRATORY_TRACT | Status: DC
  Administered 2023-12-13 – 2023-12-14 (×4): 3 mL via RESPIRATORY_TRACT
  Filled 2023-12-13 (×4): qty 3

## 2023-12-13 MED ORDER — BUDESONIDE 0.5 MG/2ML IN SUSP
0.5000 mg | Freq: Two times a day (BID) | RESPIRATORY_TRACT | Status: DC
  Administered 2023-12-13 – 2023-12-14 (×3): 0.5 mg via RESPIRATORY_TRACT
  Filled 2023-12-13 (×3): qty 2

## 2023-12-13 NOTE — ED Triage Notes (Signed)
 C/O SOB since last night. Has history of asthma. Using inhalers and nebulizer without relief.  Audible inspiratory and expiratory wheezing heard. DOE. Nasal congestion.  Also c/o blood in stool x 3 days.

## 2023-12-13 NOTE — Hospital Course (Addendum)
 No 2nd inhaler in over one week. Breathing started worsening over the last 2 days. No sick contacts or hx of allergies. No sick symptoms. Smokes 1 pack per week but none for a few weeks. Never been intubated.   Lives with girlfriend and indepdent in adls and iadls.

## 2023-12-13 NOTE — H&P (Signed)
 History and Physical    Justin Reid FMW:[id number] DOB: 1980/07/06 DOA: 12/13/2023  DOS: the patient was seen and examined on 12/13/2023  PCP: Pcp, No   Patient coming from: Home  I have personally briefly reviewed patient's old medical records in [facility] and CareEverywhere  HPI:   Justin Reid is a 43 y.o. year old male with medical history of moderate to severe persistent asthma, OSA, Class III obesity, presented to the ED with worsening shortness of breath.  Patient states over the last 2 days his breathing has been getting worse.  He is not able to state why.  States he has not been smoking or has not been around anybody sick.  Does state he has history of significant asthma but has not seen a pulmonologist for this.  He has 2 inhalers and has been out of one of his inhalers.  On chart review patient with multiple ED visits and a recent admission for asthma exacerbation.    Currently patient appears very somnolent.  On arrival to the ED patient was noted to be HDS stable.  Lab work and imaging obtained.  CBC without leukocytosis, mild anemia that is normocytic slight thrombocytosis.  BMP unremarkable except mild hyperglycemia.  Magnesium  checked and 2.2.  VBG 7.43/51/44.  Respiratory viral negative for COVID, flu, RSV.  He was placed on BiPAP given his work of breathing and given Solu-Medrol  and IV magnesium  and [facility] contacted for admission.  Review of Systems: As mentioned in the history of present illness. All other systems reviewed and are negative.   Past Medical History:  Diagnosis Date   2019-nCoV vaccination declined 11/10/2020   Arthritis    Asthma    Hypertension    Morbid obesity (HCC)    Shingles    Tobacco use     History reviewed. No pertinent surgical history.   No Known Allergies  Family History  Problem Relation Age of Onset   Hypertension Father    Hypertension Maternal Grandmother     Prior to Admission medications   Medication  Sig Start Date End Date Taking? Authorizing Provider  acetaminophen  (TYLENOL ) 325 MG tablet Take 2 tablets (650 mg total) by mouth every 6 (six) hours as needed for mild pain (pain score 1-3) or fever. 04/06/23  Yes Marsa Edelman, DO  ADVAIR DISKUS 500-50 MCG/ACT AEPB Inhale 1 puff into the lungs in the morning and at bedtime. 10/16/23  Yes [provider]  albuterol  (PROVENTIL ) (2.5 MG/3ML) 0.083% nebulizer solution Take 3 mLs (2.5 mg total) by nebulization every 4 (four) hours as needed for wheezing or shortness of breath. 09/30/23 09/29/24 Yes Wieting, Richard, MD  albuterol  (VENTOLIN  HFA) [phone] Base) MCG/ACT inhaler Inhale 2 puffs into the lungs every 6 (six) hours as needed for wheezing or shortness of breath. 11/17/23  Yes Fernand Rossie HERO, MD  diclofenac  (VOLTAREN ) 75 MG EC tablet Take 75 mg by mouth 2 (two) times daily with a meal. 12/05/23  Yes [provider]  furosemide  (LASIX ) 40 MG tablet Take 1 tablet (40 mg total) by mouth daily. 09/30/23 12/13/23 Yes Wieting, Richard, MD  lidocaine  (LIDODERM ) 5 % Place 1 patch onto the skin every 12 (twelve) hours. Remove & Discard patch within 12 hours or as directed by MD 10/24/23 10/23/24 Yes Claudene Rover, MD  loratadine  (CLARITIN ) 10 MG tablet Take 1 tablet (10 mg total) by mouth daily. 04/06/23  Yes Alexander, Natalie, DO  meloxicam  (MOBIC ) 15 MG tablet Take 1 tablet (15  mg total) by mouth daily. 07/25/23 07/24/24 Yes Bradler, Evan K, MD  methocarbamol  (ROBAXIN ) 500 MG tablet Take 1 tablet (500 mg total) by mouth every 8 (eight) hours as needed. 10/24/23  Yes Claudene Rover, MD  metoprolol  succinate (TOPROL -XL) 50 MG 24 hr tablet Take 1 tablet (50 mg total) by mouth daily. Take with or immediately following a meal. 09/30/23 12/13/23 Yes Wieting, Richard, MD  montelukast  (SINGULAIR ) 10 MG tablet Take 1 tablet (10 mg total) by mouth daily. 09/30/23 12/29/23 Yes Wieting, Richard, MD  naproxen  (NAPROSYN ) 500 MG tablet Take 500 mg by mouth 2  (two) times daily with a meal.   Yes [provider]  pantoprazole  (PROTONIX ) 40 MG tablet Take 1 tablet (40 mg total) by mouth daily. 09/30/23 12/13/23 Yes Josette Ade, MD  azithromycin  (ZITHROMAX ) 250 MG tablet Take 1 tablet (250 mg total) by mouth daily for one more day Patient not taking: Reported on 12/13/2023 10/01/23   Josette Ade, MD  fluticasone -salmeterol (WIXELA INHUB ) 250-50 MCG/ACT AEPB Inhale 1 puff into the lungs in the morning and at bedtime. 09/30/23 11/29/23  Josette Ade, MD  oxyCODONE  (ROXICODONE ) 5 MG immediate release tablet Take 1 tablet (5 mg total) by mouth every 6 (six) hours as needed for severe pain (pain score 7-10). Patient not taking: Reported on 12/13/2023 09/30/23   Josette Ade, MD    Social History:  reports that he has quit smoking. His smoking use included cigarettes. He has a 2.5 pack-year smoking history. He has never used smokeless tobacco. He reports that he does not currently use alcohol. He reports that he does not use drugs.    Physical Exam: Vitals:   12/13/23 1600 12/13/23 1700 12/13/23 1731 12/13/23 1800  BP: (!) 148/103 (!) 167/115 (!) 167/115 (!) 155/100  Pulse: 81 86  (!) 103  Resp: 17 (!) 22  (!) 23  Temp:      TempSrc:      SpO2: 94% 96%  98%  Weight:      Height:        Gen: NAD HENT: NCAT CV: normal heart sounds Lung: CTAB Abd: No TTP, normal bowel sounds MSK: No asymmetry, good bulk and tone Neuro: alert and oriented   Labs on Admission: I have personally reviewed following labs and imaging studies  CBC: Recent Labs  Lab 12/13/23 1103  WBC 9.1  NEUTROABS 5.9  HGB 12.8*  HCT 41.6  MCV 87.2  PLT 409*   Basic Metabolic Panel: Recent Labs  Lab 12/13/23 1103 12/13/23 1106  NA 137  --   K 4.2  --   CL 99  --   CO2 26  --   GLUCOSE 114*  --   BUN 15  --   CREATININE 0.80  --   CALCIUM  9.5  --   MG  --  2.2   GFR: Estimated Creatinine Clearance: 181 mL/min (by C-G formula based on SCr  of 0.8 mg/dL). Liver Function Tests: No results for input(s): AST, ALT, ALKPHOS, BILITOT, PROT, ALBUMIN in the last 168 hours. No results for input(s): LIPASE, AMYLASE in the last 168 hours. No results for input(s): AMMONIA in the last 168 hours. Coagulation Profile: No results for input(s): INR, PROTIME in the last 168 hours. Cardiac Enzymes: No results for input(s): CKTOTAL, CKMB, CKMBINDEX, TROPONINI, TROPONINIHS in the last 168 hours. BNP (last 3 results) Recent Labs    04/01/23 1610 04/09/23 1552 05/12/23 1449  BNP 11.4 4.5 18.4   HbA1C: No results for input(s):  HGBA1C in the last 72 hours. CBG: Recent Labs  Lab 12/13/23 1534  GLUCAP 120*   Lipid Profile: No results for input(s): CHOL, HDL, LDLCALC, TRIG, CHOLHDL, LDLDIRECT in the last 72 hours. Thyroid  Function Tests: No results for input(s): TSH, T4TOTAL, FREET4, T3FREE, THYROIDAB in the last 72 hours. Anemia Panel: No results for input(s): VITAMINB12, FOLATE, FERRITIN, TIBC, IRON, RETICCTPCT in the last 72 hours. Urine analysis: No results found for: COLORURINE, APPEARANCEUR, LABSPEC, PHURINE, GLUCOSEU, HGBUR, BILIRUBINUR, KETONESUR, PROTEINUR, UROBILINOGEN, NITRITE, LEUKOCYTESUR  Radiological Exams on Admission: I have personally reviewed images DG Chest Portable 1 View Result Date: 12/13/2023 EXAM: 1 VIEW(S) XRAY OF THE CHEST 12/13/2023 11:48:12 AM COMPARISON: 11/17/2023 CLINICAL HISTORY: SOB FINDINGS: LUNGS AND PLEURA: Low lung volumes. No focal pulmonary opacity. No pleural effusion. No pneumothorax. HEART AND MEDIASTINUM: No acute abnormality of the cardiac and mediastinal silhouettes. BONES AND SOFT TISSUES: Degenerative changes of left shoulder. IMPRESSION: 1. Low lung volumes. Electronically signed by: Evalene Coho MD 12/13/2023 12:06 PM EST RP Workstation: [id number]    EKG: My personal interpretation of EKG shows:  Sinus without any acute ST changes  Assessment/Plan Principal Problem:   Severe asthma with exacerbation Active Problems:   OSA (obstructive sleep apnea)   Essential hypertension   Obesity, Class III, BMI 40-49.9 (morbid obesity) (HCC)   GERD (gastroesophageal reflux disease)   Patient with asthma exacerbation.  Exact etiology of exacerbation is not known but I think he needs to be more adherent with inhaler given severity of his asthma.  Will check extended viral panel.  Given his symptom burden I will place his asthma as severe persistent.  He is on BiPAP and appears somnolent.  VBG is reassuring but his lung exam is concerning as he has very diminished sounds with mild wheezing.  There is concern for impending respiratory failure.  Will start him on Pulmicort  nebulizers and continue DuoNebs as well.  Will start him on oral steroids.  Given he would decompensate further placed in stepdown.  Will try to attempt to wean BiPAP as able.  Given repeated episodes will consult pulmonology  OSA: Patient established with pulmonology recently for suspected sleep apnea.  He states he has an appointment coming up later this week.  Hypertension: Patient previously placed on amlodipine  and metoprolol .  Will restart amlodipine  holding metoprolol  as it can impair his tenuous respiratory status  Class III obesity: Discussed lifestyle modifications and benefit of weight loss on his breathing and overall health.  GERD: Continue home PPI  VTE prophylaxis:  SQ Heparin   Diet: Heart healthy Code Status:  Full Code Telemetry:  Admission status: Observation, Step Down Unit Patient is from: Home Anticipated d/c is to: Home Anticipated d/c is in: 1-2 days   Family Communication: Updated at bedside  Consults called: Pulmonology, secure chat sent to Dr. Parris   Severity of Illness: The appropriate patient status for this patient is OBSERVATION. Observation status is judged to be reasonable and  necessary in order to provide the required intensity of service to ensure the patient's safety. The patient's presenting symptoms, physical exam findings, and initial radiographic and laboratory data in the context of their medical condition is felt to place them at decreased risk for further clinical deterioration. Furthermore, it is anticipated that the patient will be medically stable for discharge from the hospital within 2 midnights of admission.    Morene Bathe, MD Jolynn DEL. [facility]

## 2023-12-13 NOTE — ED Provider Notes (Signed)
 [facility] Provider Note    Event Date/Time   First MD Initiated Contact with Patient 12/13/23 1057     (approximate)   History   Shortness of Breath   HPI  Justin Reid is a 43 y.o. male with a past medical history of asthma presenting to the emergency department complaining of shortness of breath which has been ongoing since yesterday.  Patient reports that he has been using his inhalers at home with little relief of symptoms.  He denies any recent sick contacts, fevers, cough, congestion, nausea, vomiting, or diarrhea.  The patient does also report that for the last few days he has noticed bright red blood with his bowel movements.  He denies any pain with these bowel movements or any abdominal pain.  Also denies dizziness or lightheadedness.     Physical Exam   Triage Vital Signs: ED Triage Vitals [12/13/23 1047]  Encounter Vitals Group     BP (!) 160/117     Girls Systolic BP Percentile      Girls Diastolic BP Percentile      Boys Systolic BP Percentile      Boys Diastolic BP Percentile      Pulse Rate (!) 104     Resp (!) 24     Temp 98.4 F (36.9 C)     Temp Source Oral     SpO2 98 %     Weight (!) 319 lb 14.2 oz (145.1 kg)     Height      Head Circumference      Peak Flow      Pain Score 0     Pain Loc      Pain Education      Exclude from Growth Chart     Most recent vital signs: Vitals:   12/13/23 1047  BP: (!) 160/117  Pulse: (!) 104  Resp: (!) 24  Temp: 98.4 F (36.9 C)  SpO2: 98%     General: Awake, no distress.  CV:  Good peripheral perfusion.  Normal sinus rhythm, no murmurs rubs or gallops Resp:  Diffuse expiratory wheezing with decreased breath sounds in all lung fields Abd:  No distention.  Nontender to palpation    ED Results / Procedures / Treatments   Labs (all labs ordered are listed, but only abnormal results are displayed) Labs Reviewed  CBC WITH DIFFERENTIAL/PLATELET  BASIC METABOLIC PANEL  WITH GFR     EKG     RADIOLOGY Chest x-ray by my interpretation does not show any acute pathology.  Radiologist interprets as low volumes.    PROCEDURES:  Critical Care performed:   CRITICAL CARE Performed by: Reche CHRISTELLA Leventhal   Total critical care time: 30 minutes  Critical care time was exclusive of separately billable procedures and treating other patients.  Critical care was necessary to treat or prevent imminent or life-threatening deterioration.  Critical care was time spent personally by me on the following activities: development of treatment plan with patient and/or surrogate as well as nursing, discussions with consultants, evaluation of patient's response to treatment, examination of patient, obtaining history from patient or surrogate, ordering and performing treatments and interventions, ordering and review of laboratory studies, ordering and review of radiographic studies, pulse oximetry and re-evaluation of patient's condition.   Procedures   MEDICATIONS ORDERED IN ED: Medications  budesonide  (PULMICORT ) nebulizer solution 0.5 mg (0.5 mg Nebulization Given 12/13/23 1359)  sodium chloride  flush (NS) 0.9 % injection 3 mL (3 mLs  Intravenous Given 12/13/23 1412)  acetaminophen  (TYLENOL ) tablet 650 mg (has no administration in time range)    Or  acetaminophen  (TYLENOL ) suppository 650 mg (has no administration in time range)  senna-docusate (Senokot-S) tablet 1 tablet (has no administration in time range)  heparin  injection 5,000 Units (5,000 Units Subcutaneous Given 12/13/23 1407)  lactated ringers  infusion ( Intravenous New Bag/Given 12/13/23 1411)  ipratropium-albuterol  (DUONEB) 0.5-2.5 (3) MG/3ML nebulizer solution 3 mL (3 mLs Nebulization Given 12/13/23 1407)  ondansetron  (ZOFRAN ) tablet 4 mg (has no administration in time range)    Or  ondansetron  (ZOFRAN ) injection 4 mg (has no administration in time range)  ipratropium-albuterol  (DUONEB) 0.5-2.5 (3)  MG/3ML nebulizer solution 3 mL (3 mLs Nebulization Given 12/13/23 1109)  methylPREDNISolone  sodium succinate (SOLU-MEDROL ) 125 mg/2 mL injection 125 mg (125 mg Intravenous Given 12/13/23 1111)  ipratropium-albuterol  (DUONEB) 0.5-2.5 (3) MG/3ML nebulizer solution 3 mL (3 mLs Nebulization Given 12/13/23 1126)  sodium chloride  0.9 % bolus 1,000 mL (1,000 mLs Intravenous New Bag/Given 12/13/23 1156)  magnesium  sulfate IVPB 2 g 50 mL (0 g Intravenous Stopped 12/13/23 1302)     IMPRESSION / MDM / ASSESSMENT AND PLAN / ED COURSE  I reviewed the triage vital signs and the nursing notes.                              Differential diagnosis includes, but is not limited to, asthma exacerbation, pneumonia, pneumothorax, pleural effusion, respiratory virus  Patient's presentation is most consistent with exacerbation of chronic illness.  This patient is a 43 year old male with past medical history of asthma presenting to the emergency department complaining of 2 days of worsening shortness of breath.  Patient will be treated with IV steroids and DuoNeb here in the emergency department.  Chest x-ray and lab work ordered for further evaluation of the patient's complaints.  After treatment with 125 mg of Solu-Medrol  and 2 DuoNeb's the patient continues to have significant respiratory distress.  The patient will be placed on BiPAP.  IV fluids and magnesium  also ordered.  I discussed admission with the patient who is agreeable.  I discussed patient's case with the hospitalist who is in agreement with plan for admission at this time.      FINAL CLINICAL IMPRESSION(S) / ED DIAGNOSES   Final diagnoses:  Severe asthma with exacerbation, unspecified whether persistent     Rx / DC Orders   ED Discharge Orders     None        Note:  This document was prepared using Dragon voice recognition software and may include unintentional dictation errors.   Rexford Reche HERO, MD 12/13/23 1419

## 2023-12-14 LAB — BASIC METABOLIC PANEL WITH GFR
Anion gap: 12 (ref 5–15)
BUN: 22 mg/dL — ABNORMAL HIGH (ref 6–20)
CO2: 26 mmol/L (ref 22–32)
Calcium: 9 mg/dL (ref 8.9–10.3)
Chloride: 98 mmol/L (ref 98–111)
Creatinine, Ser: 0.91 mg/dL (ref 0.61–1.24)
GFR, Estimated: >60 mL/min (ref >60)
Glucose, Bld: 132 mg/dL — ABNORMAL HIGH (ref 70–99)
Potassium: 4.1 mmol/L (ref 3.5–5.1)
Sodium: 136 mmol/L (ref 135–145)

## 2023-12-14 LAB — CBC
HCT: 37.3 % — ABNORMAL LOW (ref 39.0–52.0)
Hemoglobin: 12 g/dL — ABNORMAL LOW (ref 13.0–17.0)
MCH: 27.4 pg (ref 26.0–34.0)
MCHC: 32.2 g/dL (ref 30.0–36.0)
MCV: 85.2 fL (ref 80.0–100.0)
Platelets: 401 K/uL — ABNORMAL HIGH (ref 150–400)
RBC: 4.38 MIL/uL (ref 4.22–5.81)
RDW: 14.6 % (ref 11.5–15.5)
WBC: 11.2 K/uL — ABNORMAL HIGH (ref 4.0–10.5)
nRBC: 0 % (ref 0.0–0.2)

## 2023-12-14 NOTE — Plan of Care (Signed)

## 2023-12-14 NOTE — Consult Note (Incomplete)
 PULMONOLOGY         Date: 12/14/2023,   MRN# [id number] Justin Reid [birth year]     AdmissionWeight: (!) 145.1 kg                 CurrentWeight: (!) 158.5 kg  Referring provider: Dr Fernand   CHIEF COMPLAINT:   Severe Acute asthma exacerbation   HISTORY OF PRESENT ILLNESS   ***   PAST MEDICAL HISTORY   Past Medical History:  Diagnosis Date   2019-nCoV vaccination declined 11/10/2020   Arthritis    Asthma    Hypertension    Morbid obesity (HCC)    Shingles    Tobacco use      SURGICAL HISTORY   History reviewed. No pertinent surgical history.   FAMILY HISTORY   Family History  Problem Relation Age of Onset   Hypertension Father    Hypertension Maternal Grandmother      SOCIAL HISTORY   Social History   Tobacco Use   Smoking status: Former    Current packs/day: 0.50    Average packs/day: 0.5 packs/day for 5.0 years (2.5 ttl pk-yrs)    Types: Cigarettes   Smokeless tobacco: Never  Vaping Use   Vaping status: Never Used  Substance Use Topics   Alcohol use: Not Currently   Drug use: Never     MEDICATIONS    Home Medication:    Current Medication:  Current Facility-Administered Medications:    acetaminophen  (TYLENOL ) tablet 650 mg, 650 mg, Oral, Q6H PRN **OR** acetaminophen  (TYLENOL ) suppository 650 mg, 650 mg, Rectal, Q6H PRN, Fernand Prost, MD   budesonide  (PULMICORT ) nebulizer solution 0.5 mg, 0.5 mg, Nebulization, BID, Khan, Ghalib, MD, 0.5 mg at 12/13/23 2127   diphenhydrAMINE  (BENADRYL ) capsule 25 mg, 25 mg, Oral, Q6H PRN, Cleatus Delayne GAILS, MD, 25 mg at 12/13/23 2156   heparin  injection 5,000 Units, 5,000 Units, Subcutaneous, Q8H, Fernand Prost, MD, 5,000 Units at 12/14/23 9373   ipratropium-albuterol  (DUONEB) 0.5-2.5 (3) MG/3ML nebulizer solution 3 mL, 3 mL, Nebulization, Q6H, Fernand Prost, MD, 3 mL at 12/14/23 0115   lidocaine  (LIDODERM ) 5 % 1 patch, 1 patch, Transdermal, QHS, Cleatus Delayne GAILS, MD, 1 patch at 12/13/23  2341   methocarbamol  (ROBAXIN ) tablet 500 mg, 500 mg, Oral, Q8H PRN, Cleatus Delayne GAILS, MD, 500 mg at 12/13/23 2156   naproxen  (NAPROSYN ) tablet 500 mg, 500 mg, Oral, BID PRN, Cleatus Delayne GAILS, MD, 500 mg at 12/14/23 0042   ondansetron  (ZOFRAN ) tablet 4 mg, 4 mg, Oral, Q6H PRN **OR** ondansetron  (ZOFRAN ) injection 4 mg, 4 mg, Intravenous, Q6H PRN, Fernand Prost, MD   predniSONE  (DELTASONE ) tablet 40 mg, 40 mg, Oral, Q breakfast, Fernand Prost, MD   senna-docusate (Senokot-S) tablet 1 tablet, 1 tablet, Oral, QHS PRN, Fernand Prost, MD   sodium chloride  flush (NS) 0.9 % injection 3 mL, 3 mL, Intravenous, Q12H, Fernand Prost, MD, 3 mL at 12/13/23 2200   traMADol  (ULTRAM ) tablet 50 mg, 50 mg, Oral, Q12H PRN, Cleatus Delayne GAILS, MD, 50 mg at 12/13/23 2126    ALLERGIES   Patient has no known allergies.     REVIEW OF SYSTEMS    Review of Systems:  Gen:  Denies  fever, sweats, chills weigh loss  HEENT: Denies blurred vision, double vision, ear pain, eye pain, hearing loss, nose bleeds, sore throat Cardiac:  No dizziness, chest pain or heaviness, chest tightness,edema Resp:   reports dyspnea chronically  Gi: Denies swallowing difficulty, stomach pain, nausea or vomiting,  diarrhea, constipation, bowel incontinence Gu:  Denies bladder incontinence, burning urine Ext:   Denies Joint pain, stiffness or swelling Skin: Denies  skin rash, easy bruising or bleeding or hives Endoc:  Denies polyuria, polydipsia , polyphagia or weight change Psych:   Denies depression, insomnia or hallucinations   Other:  All other systems negative   VS: BP (!) 149/103   Pulse 86   Temp 98.2 F (36.8 C) (Oral)   Resp 17   Ht 5' 10 (1.778 m)   Wt (!) 158.5 kg   SpO2 97%   BMI 50.14 kg/m      PHYSICAL EXAM    GENERAL:NAD, no fevers, chills, no weakness no fatigue HEAD: Normocephalic, atraumatic.  EYES: Pupils equal, round, reactive to light. Extraocular muscles intact. No scleral icterus.  MOUTH: Moist  mucosal membrane. Dentition intact. No abscess noted.  EAR, NOSE, THROAT: Clear without exudates. No external lesions.  NECK: Supple. No thyromegaly. No nodules. No JVD.  PULMONARY: decreased breath sounds with mild rhonchi worse at bases bilaterally.  CARDIOVASCULAR: S1 and S2. Regular rate and rhythm. No murmurs, rubs, or gallops. No edema. Pedal pulses 2+ bilaterally.  GASTROINTESTINAL: Soft, nontender, nondistended. No masses. Positive bowel sounds. No hepatosplenomegaly.  MUSCULOSKELETAL: No swelling, clubbing, or edema. Range of motion full in all extremities.  NEUROLOGIC: Cranial nerves II through XII are intact. No gross focal neurological deficits. Sensation intact. Reflexes intact.  SKIN: No ulceration, lesions, rashes, or cyanosis. Skin warm and dry. Turgor intact.  PSYCHIATRIC: Mood, affect within normal limits. The patient is awake, alert and oriented x 3. Insight, judgment intact.       IMAGING   @IMAGES @   ASSESSMENT/PLAN   ***            Thank you for allowing me to participate in the care of this patient.   Patient/Family are satisfied with care plan and all questions have been answered.    Provider disclosure: Patient with at least one acute or chronic illness or injury that poses a threat to life or bodily function and is being managed actively during this encounter.  All of the below services have been performed independently by signing provider:  review of prior documentation from internal and or external health records.  Review of previous and current lab results.  Interview and comprehensive assessment during patient visit today. Review of current and previous chest radiographs/CT scans. Discussion of management and test interpretation with health care team and patient/family.   This document was prepared using Dragon voice recognition software and may include unintentional dictation errors.     Kamau Weatherall, M.D.  Division of Pulmonary & Critical  Care Medicine

## 2023-12-14 NOTE — Discharge Summary (Signed)
 DISCHARGE SUMMARY    Justin Reid FMW:[id number] DOB: [birth year] DOA: 12/13/2023  PCP: Pcp, No  Admit date: 12/13/2023 Discharge date: 12/14/2023   Recommendations for Outpatient Follow-up:  Left AGAINST MEDICAL ADVICE   Hospital Course: MALIQ PILLEY is a 43 year old male with moderate to severe persistent asthma, OSA, class III obesity, frequent ED visits who presented on this admission with shortness of breath.  Reports he has been out of one of his inhalers. On arrival magnesium  2.2, VBG 7.4 3/51/44.  RVP negative.  He was placed on BiPAP for work of breathing and given Solu-Medrol , and magnesium .  He was admitted to the stepdown unit for close observation. On 12/10 patient endorsed feeling better and reported he would not wait for a physician evaluation this morning.  He left AGAINST MEDICAL ADVICE.  I did not interact with this patient.   Asthma exacerbation - Intermittently using BiPAP - RVP negative - Questionable adherence to outpatient inhalers, needed refill.  Left before this was able to be performed - Reportedly has pulmonology follow-up  Frequent readmissions - Patient has been to the ED over 12 times in the last 6 months.  Unfortunately he left AGAINST MEDICAL ADVICE before he could be plugged in with our nurse coordinator  Additional problems: OSA Hypertension Class III obesity GERD   Discharge Instructions     No Known Allergies  Consultations:    Procedures/Studies: DG Chest Portable 1 View Result Date: 12/13/2023 EXAM: 1 VIEW(S) XRAY OF THE CHEST 12/13/2023 11:48:12 AM COMPARISON: 11/17/2023 CLINICAL HISTORY: SOB FINDINGS: LUNGS AND PLEURA: Low lung volumes. No focal pulmonary opacity. No pleural effusion. No pneumothorax. HEART AND MEDIASTINUM: No acute abnormality of the cardiac and mediastinal silhouettes. BONES AND SOFT TISSUES: Degenerative changes of left shoulder. IMPRESSION: 1. Low lung volumes. Electronically signed by: Evalene Coho MD 12/13/2023 12:06 PM EST RP Workstation: [id number]   DG Chest 2 View Result Date: 11/17/2023 CLINICAL DATA:  Shortness of breath. EXAM: CHEST - 2 VIEW COMPARISON:  09/27/2023 FINDINGS: Stable mild cardiac enlargement. Low bilateral lung volumes. There is no evidence of pulmonary edema, consolidation, pneumothorax or pleural fluid. The visualized skeletal structures are unremarkable. IMPRESSION: Low bilateral lung volumes. No acute findings. Electronically Signed   By: Marcey Moan M.D.   On: 11/17/2023 09:57      Discharge Exam: Vitals:   12/14/23 0800 12/14/23 0812  BP: (!) 151/100   Pulse: 81   Resp: 18   Temp: 97.8 F (36.6 C)   SpO2: 96% 96%   Vitals:   12/14/23 0720 12/14/23 0745 12/14/23 0800 12/14/23 0812  BP: (!) 149/103  (!) 151/100   Pulse: 86 82 81   Resp: 17 (!) 23 18   Temp:   97.8 F (36.6 C)   TempSrc:   Oral   SpO2: 97% 97% 96% 96%  Weight:      Height:        Patient left AGAINST MEDICAL ADVICE before physical exam could be performed  The results of significant diagnostics from this hospitalization (including imaging, microbiology, ancillary and laboratory) are listed below for reference.     Microbiology: Recent Results (from the past 240 hours)  Resp panel by RT-PCR (RSV, Flu A&B, Covid) Anterior Nasal Swab     Status: None   Collection Time: 12/13/23  2:12 PM   Specimen: Anterior Nasal Swab  Result Value Ref Range Status   SARS Coronavirus 2 by RT PCR NEGATIVE NEGATIVE Final    Comment: (NOTE) SARS-CoV-2 target nucleic  acids are NOT DETECTED.  The SARS-CoV-2 RNA is generally detectable in upper respiratory specimens during the acute phase of infection. The lowest concentration of SARS-CoV-2 viral copies this assay can detect is 138 copies/mL. A negative result does not preclude SARS-Cov-2 infection and should not be used as the sole basis for treatment or other patient management decisions. A negative result may occur with   improper specimen collection/handling, submission of specimen other than nasopharyngeal swab, presence of viral mutation(s) within the areas targeted by this assay, and inadequate number of viral copies(<138 copies/mL). A negative result must be combined with clinical observations, patient history, and epidemiological information. The expected result is Negative.  Fact Sheet for Patients:  bloggercourse.com  Fact Sheet for Healthcare Providers:  seriousbroker.it  This test is no t yet approved or cleared by the [place]  FDA and  has been authorized for detection and/or diagnosis of SARS-CoV-2 by FDA under an Emergency Use Authorization (EUA). This EUA will remain  in effect (meaning this test can be used) for the duration of the COVID-19 declaration under Section 564(b)(1) of the Act, 21 U.S.C.section 360bbb-3(b)(1), unless the authorization is terminated  or revoked sooner.       Influenza A by PCR NEGATIVE NEGATIVE Final   Influenza B by PCR NEGATIVE NEGATIVE Final    Comment: (NOTE) The Xpert Xpress SARS-CoV-2/FLU/RSV plus assay is intended as an aid in the diagnosis of influenza from Nasopharyngeal swab specimens and should not be used as a sole basis for treatment. Nasal washings and aspirates are unacceptable for Xpert Xpress SARS-CoV-2/FLU/RSV testing.  Fact Sheet for Patients: bloggercourse.com  Fact Sheet for Healthcare Providers: seriousbroker.it  This test is not yet approved or cleared by the [place]  FDA and has been authorized for detection and/or diagnosis of SARS-CoV-2 by FDA under an Emergency Use Authorization (EUA). This EUA will remain in effect (meaning this test can be used) for the duration of the COVID-19 declaration under Section 564(b)(1) of the Act, 21 U.S.C. section 360bbb-3(b)(1), unless the authorization is terminated or revoked.      Resp Syncytial Virus by PCR NEGATIVE NEGATIVE Final    Comment: (NOTE) Fact Sheet for Patients: bloggercourse.com  Fact Sheet for Healthcare Providers: seriousbroker.it  This test is not yet approved or cleared by the [place]  FDA and has been authorized for detection and/or diagnosis of SARS-CoV-2 by FDA under an Emergency Use Authorization (EUA). This EUA will remain in effect (meaning this test can be used) for the duration of the COVID-19 declaration under Section 564(b)(1) of the Act, 21 U.S.C. section 360bbb-3(b)(1), unless the authorization is terminated or revoked.  Performed at [facility], [address] [place], [place] [address]   MRSA Next Gen by PCR, Nasal     Status: None   Collection Time: 12/13/23  3:24 PM   Specimen: Nasal Mucosa; Nasal Swab  Result Value Ref Range Status   MRSA by PCR Next Gen NOT DETECTED NOT DETECTED Final    Comment: (NOTE) The GeneXpert MRSA Assay (FDA approved for NASAL specimens only), is one component of a comprehensive MRSA colonization surveillance program. It is not intended to diagnose MRSA infection nor to guide or monitor treatment for MRSA infections. Test performance is not FDA approved in patients less than 61 years old. Performed at [facility], [address] [place], [place] [address]      Labs: BNP (last 3 results) Recent Labs    04/01/23 1610 04/09/23 1552 05/12/23 1449  BNP 11.4  4.5 18.4   Basic Metabolic Panel: Recent Labs  Lab 12/13/23 1103 12/13/23 1106 12/14/23 0342  NA 137  --  136  K 4.2  --  4.1  CL 99  --  98  CO2 26  --  26  GLUCOSE 114*  --  132*  BUN 15  --  22*  CREATININE 0.80  --  0.91  CALCIUM  9.5  --  9.0  MG  --  2.2  --    Liver Function Tests: No results for input(s): AST, ALT, ALKPHOS, BILITOT, PROT, ALBUMIN in the last 168 hours. No results for input(s): LIPASE, AMYLASE in the last  168 hours. No results for input(s): AMMONIA in the last 168 hours. CBC: Recent Labs  Lab 12/13/23 1103 12/14/23 0342  WBC 9.1 11.2*  NEUTROABS 5.9  --   HGB 12.8* 12.0*  HCT 41.6 37.3*  MCV 87.2 85.2  PLT 409* 401*   Cardiac Enzymes: No results for input(s): CKTOTAL, CKMB, CKMBINDEX, TROPONINI in the last 168 hours. BNP: Invalid input(s): POCBNP CBG: Recent Labs  Lab 12/13/23 1534  GLUCAP 120*   D-Dimer No results for input(s): DDIMER in the last 72 hours. Hgb A1c No results for input(s): HGBA1C in the last 72 hours. Lipid Profile No results for input(s): CHOL, HDL, LDLCALC, TRIG, CHOLHDL, LDLDIRECT in the last 72 hours. Thyroid  function studies No results for input(s): TSH, T4TOTAL, T3FREE, THYROIDAB in the last 72 hours.  Invalid input(s): FREET3 Anemia work up No results for input(s): VITAMINB12, FOLATE, FERRITIN, TIBC, IRON, RETICCTPCT in the last 72 hours. Urinalysis No results found for: COLORURINE, APPEARANCEUR, LABSPEC, PHURINE, GLUCOSEU, HGBUR, BILIRUBINUR, KETONESUR, PROTEINUR, UROBILINOGEN, NITRITE, LEUKOCYTESUR Sepsis Labs Recent Labs  Lab 12/13/23 1103 12/14/23 0342  WBC 9.1 11.2*   Microbiology Recent Results (from the past 240 hours)  Resp panel by RT-PCR (RSV, Flu A&B, Covid) Anterior Nasal Swab     Status: None   Collection Time: 12/13/23  2:12 PM   Specimen: Anterior Nasal Swab  Result Value Ref Range Status   SARS Coronavirus 2 by RT PCR NEGATIVE NEGATIVE Final    Comment: (NOTE) SARS-CoV-2 target nucleic acids are NOT DETECTED.  The SARS-CoV-2 RNA is generally detectable in upper respiratory specimens during the acute phase of infection. The lowest concentration of SARS-CoV-2 viral copies this assay can detect is 138 copies/mL. A negative result does not preclude SARS-Cov-2 infection and should not be used as the sole basis for treatment or other patient management  decisions. A negative result may occur with  improper specimen collection/handling, submission of specimen other than nasopharyngeal swab, presence of viral mutation(s) within the areas targeted by this assay, and inadequate number of viral copies(<138 copies/mL). A negative result must be combined with clinical observations, patient history, and epidemiological information. The expected result is Negative.  Fact Sheet for Patients:  bloggercourse.com  Fact Sheet for Healthcare Providers:  seriousbroker.it  This test is no t yet approved or cleared by the [place]  FDA and  has been authorized for detection and/or diagnosis of SARS-CoV-2 by FDA under an Emergency Use Authorization (EUA). This EUA will remain  in effect (meaning this test can be used) for the duration of the COVID-19 declaration under Section 564(b)(1) of the Act, 21 U.S.C.section 360bbb-3(b)(1), unless the authorization is terminated  or revoked sooner.       Influenza A by PCR NEGATIVE NEGATIVE Final   Influenza B by PCR NEGATIVE NEGATIVE Final    Comment: (NOTE) The Xpert Xpress SARS-CoV-2/FLU/RSV  plus assay is intended as an aid in the diagnosis of influenza from Nasopharyngeal swab specimens and should not be used as a sole basis for treatment. Nasal washings and aspirates are unacceptable for Xpert Xpress SARS-CoV-2/FLU/RSV testing.  Fact Sheet for Patients: bloggercourse.com  Fact Sheet for Healthcare Providers: seriousbroker.it  This test is not yet approved or cleared by the [place]  FDA and has been authorized for detection and/or diagnosis of SARS-CoV-2 by FDA under an Emergency Use Authorization (EUA). This EUA will remain in effect (meaning this test can be used) for the duration of the COVID-19 declaration under Section 564(b)(1) of the Act, 21 U.S.C. section 360bbb-3(b)(1), unless the  authorization is terminated or revoked.     Resp Syncytial Virus by PCR NEGATIVE NEGATIVE Final    Comment: (NOTE) Fact Sheet for Patients: bloggercourse.com  Fact Sheet for Healthcare Providers: seriousbroker.it  This test is not yet approved or cleared by the [place]  FDA and has been authorized for detection and/or diagnosis of SARS-CoV-2 by FDA under an Emergency Use Authorization (EUA). This EUA will remain in effect (meaning this test can be used) for the duration of the COVID-19 declaration under Section 564(b)(1) of the Act, 21 U.S.C. section 360bbb-3(b)(1), unless the authorization is terminated or revoked.  Performed at [facility], [address] [place], [place] [address]   MRSA Next Gen by PCR, Nasal     Status: None   Collection Time: 12/13/23  3:24 PM   Specimen: Nasal Mucosa; Nasal Swab  Result Value Ref Range Status   MRSA by PCR Next Gen NOT DETECTED NOT DETECTED Final    Comment: (NOTE) The GeneXpert MRSA Assay (FDA approved for NASAL specimens only), is one component of a comprehensive MRSA colonization surveillance program. It is not intended to diagnose MRSA infection nor to guide or monitor treatment for MRSA infections. Test performance is not FDA approved in patients less than 41 years old. Performed at [facility], [address], [place], [place] [address]      Time coordinating discharge: 32 min   SIGNED: Lorane Poland, DO [facility] 12/14/2023, 11:59 AM Pager   If 7PM-7AM, please contact night-coverage

## 2023-12-15 ENCOUNTER — Ambulatory Visit (INDEPENDENT_AMBULATORY_CARE_PROVIDER_SITE_OTHER): Admitting: Internal Medicine

## 2023-12-15 ENCOUNTER — Ambulatory Visit: Admitting: Internal Medicine

## 2023-12-15 ENCOUNTER — Encounter: Payer: Self-pay | Admitting: Internal Medicine

## 2023-12-15 VITALS — BP 150/80 | HR 81 | Temp 98.6°F | Ht 70.0 in | Wt 345.9 lb

## 2023-12-15 DIAGNOSIS — G4733 Obstructive sleep apnea (adult) (pediatric): Secondary | ICD-10-CM | POA: Diagnosis not present

## 2023-12-15 DIAGNOSIS — J4540 Moderate persistent asthma, uncomplicated: Secondary | ICD-10-CM

## 2023-12-15 DIAGNOSIS — Z6841 Body Mass Index (BMI) 40.0 and over, adult: Secondary | ICD-10-CM

## 2023-12-15 DIAGNOSIS — E669 Obesity, unspecified: Secondary | ICD-10-CM

## 2023-12-15 DIAGNOSIS — J45909 Unspecified asthma, uncomplicated: Secondary | ICD-10-CM | POA: Diagnosis not present

## 2023-12-15 NOTE — Progress Notes (Signed)
 Name: Justin Reid MRN: [id number] DOB: 02-02-1980    CHIEF COMPLAINT:  ASSESSMENT OF SLEEP APNEA EXCESSIVE DAYTIME SLEEPINESS Assessment of asthma   HISTORY OF PRESENT ILLNESS: Patient is seen today for problems and issues with sleep related to excessive daytime sleepiness Patient  has been having sleep problems for many years Patient has been having excessive daytime sleepiness for a long time Patient has been having extreme fatigue and tiredness, lack of energy +  very Loud snoring every night + Nonrefreshing sleep  Discussed sleep data and reviewed with patient.  Encouraged proper weight management.  Discussed driving precautions and its relationship with hypersomnolence.  Discussed operating dangerous equipment and its relationship with hypersomnolence.  Discussed sleep hygiene, and benefits of a fixed sleep waked time.  The importance of getting eight or more hours of sleep discussed with patient.  Discussed limiting the use of the computer and television before bedtime.  Decrease naps during the day, so night time sleep will become enhanced.  Limit caffeine, and sleep deprivation.  HTN, stroke, and heart failure are potential risk factors.       12/15/2023    1:00 PM  Results of the Epworth flowsheet  Sitting and reading 3  Watching TV 3  Sitting, inactive in a public place (e.g. a theatre or a meeting) 3  As a passenger in a car for an hour without a break 3  Lying down to rest in the afternoon when circumstances permit 2  Sitting and talking to someone 2  Sitting quietly after a lunch without alcohol 3  In a car, while stopped for a few minutes in traffic 2  Total score 21   Patient currently diagnosed with asthma currently on Advair, and albuterol    Patient currently on prednisone  recent admission for asthma exacerbation upper respiratory tract infection but initial bleed was admitted for GI bleed  Patient diagnosed with childhood asthma Currently on  Advair 500 Multiple admissions as a child Patient seems to be prednisone  responsive  Patient currently smoking cigarettes had been smoking 2 packs a day now 1 pack a day  Patient is morbidly obese weighing 345 pounds 5 feet 11 inches  Hospital documentation reviewed in detail Admitted for severe asthma exacerbation  BiPAP for work of breathing and given Solu-Medrol , and magnesium .  He was admitted to the stepdown unit for close observation. On 12/10 patient endorsed feeling better and reported he would not wait for a physician evaluation this morning.  He left AGAINST MEDICAL ADVICE.      PAST MEDICAL HISTORY :   has a past medical history of 2019-nCoV vaccination declined (11/10/2020), Arthritis, Asthma, Hypertension, Morbid obesity (HCC), Shingles, and Tobacco use.  has no past surgical history on file. Prior to Admission medications  Medication Sig Start Date End Date Taking? Authorizing Provider  acetaminophen  (TYLENOL ) 325 MG tablet Take 2 tablets (650 mg total) by mouth every 6 (six) hours as needed for mild pain (pain score 1-3) or fever. 04/06/23  Yes Marsa Edelman, DO  ADVAIR DISKUS 500-50 MCG/ACT AEPB Inhale 1 puff into the lungs in the morning and at bedtime. 10/16/23  Yes [provider]  albuterol  (PROVENTIL ) (2.5 MG/3ML) 0.083% nebulizer solution Take 3 mLs (2.5 mg total) by nebulization every 4 (four) hours as needed for wheezing or shortness of breath. 09/30/23 09/29/24 Yes Wieting, Richard, MD  albuterol  (VENTOLIN  HFA) 108 (90 Base) MCG/ACT inhaler Inhale 2 puffs into the lungs every 6 (six) hours as needed for wheezing or shortness  of breath. 11/17/23  Yes Fernand Rossie HERO, MD  diclofenac  (VOLTAREN ) 75 MG EC tablet Take 75 mg by mouth 2 (two) times daily with a meal. 12/05/23  Yes [provider]  furosemide  (LASIX ) 40 MG tablet Take 1 tablet (40 mg total) by mouth daily. 09/30/23 12/15/23 Yes Wieting, Richard, MD  lidocaine  (LIDODERM ) 5 % Place 1 patch  onto the skin every 12 (twelve) hours. Remove & Discard patch within 12 hours or as directed by MD 10/24/23 10/23/24 Yes Claudene Rover, MD  loratadine  (CLARITIN ) 10 MG tablet Take 1 tablet (10 mg total) by mouth daily. 04/06/23  Yes Alexander, Natalie, DO  meloxicam  (MOBIC ) 15 MG tablet Take 1 tablet (15 mg total) by mouth daily. 07/25/23 07/24/24 Yes Bradler, Evan K, MD  methocarbamol  (ROBAXIN ) 500 MG tablet Take 1 tablet (500 mg total) by mouth every 8 (eight) hours as needed. 10/24/23  Yes Claudene Rover, MD  metoprolol  succinate (TOPROL -XL) 50 MG 24 hr tablet Take 1 tablet (50 mg total) by mouth daily. Take with or immediately following a meal. 09/30/23 12/15/23 Yes Wieting, Richard, MD  montelukast  (SINGULAIR ) 10 MG tablet Take 1 tablet (10 mg total) by mouth daily. 09/30/23 12/29/23 Yes Wieting, Richard, MD  naproxen  (NAPROSYN ) 500 MG tablet Take 500 mg by mouth 2 (two) times daily with a meal.   Yes [provider]  pantoprazole  (PROTONIX ) 40 MG tablet Take 1 tablet (40 mg total) by mouth daily. 09/30/23 12/15/23 Yes Josette Ade, MD   Allergies[1]  FAMILY HISTORY:  family history includes Hypertension in his father and maternal grandmother. SOCIAL HISTORY:  reports that he has quit smoking. His smoking use included cigarettes. He has a 2.5 pack-year smoking history. He has never used smokeless tobacco. He reports that he does not currently use alcohol. He reports that he does not use drugs.   BP (!) 150/80 Comment: hasn't taken BP meds for today  Pulse 81   Temp 98.6 F (37 C)   Ht 5' 10 (1.778 m)   Wt (!) 345 lb 14.4 oz (156.9 kg)   SpO2 95%   BMI 49.63 kg/m    Review of Systems: Gen:  Denies  fever, sweats, chills weight loss  HEENT: Denies blurred vision, double vision, ear pain, eye pain, hearing loss, nose bleeds, sore throat Cardiac:  No dizziness, chest pain or heaviness, chest tightness,edema, No JVD Resp:   + -sputum production, -shortness of breath,-wheezing,  -hemoptysis,  Other:  All other systems negative   Physical Examination:   General Appearance: No distress  EYES PERRLA, EOM intact.   NECK Supple, No JVD Pulmonary: normal breath sounds, No wheezing.  CardiovascularNormal S1,S2.  No m/r/g.   Abdomen: Benign, Soft, non-tender. Neurology UE/LE 5/5 strength, no focal deficits Ext pulses intact, cap refill intact ALL OTHER ROS ARE NEGATIVE   CBC    Component Value Date/Time   WBC 11.2 (H) 12/14/2023 0342   RBC 4.38 12/14/2023 0342   HGB 12.0 (L) 12/14/2023 0342   HCT 37.3 (L) 12/14/2023 0342   PLT 401 (H) 12/14/2023 0342   MCV 85.2 12/14/2023 0342   MCH 27.4 12/14/2023 0342   MCHC 32.2 12/14/2023 0342   RDW 14.6 12/14/2023 0342   LYMPHSABS 2.5 12/13/2023 1103   MONOABS 0.4 12/13/2023 1103   EOSABS 0.3 12/13/2023 1103   BASOSABS 0.0 12/13/2023 1103      Latest Ref Rng & Units 12/14/2023    3:42 AM 12/13/2023   11:03 AM 11/17/2023    8:52  AM  BMP  Glucose 70 - 99 mg/dL 867  885  891   BUN 6 - 20 mg/dL 22  15  16    Creatinine 0.61 - 1.24 mg/dL 9.08  9.19  9.27   Sodium 135 - 145 mmol/L 136  137  136   Potassium 3.5 - 5.1 mmol/L 4.1  4.2  4.0   Chloride 98 - 111 mmol/L 98  99  103   CO2 22 - 32 mmol/L 26  26  22    Calcium  8.9 - 10.3 mg/dL 9.0  9.5  8.8      ASSESSMENT AND PLAN SYNOPSIS 43 year old morbidly obese African-American male with a diagnosis of asthma in the setting of high probability of underlying sleep apnea with active tobacco abuse  Assessment of sleep apnea In-lab sleep study ordered  Assessment of asthma Absolute eosinophil count 300 Continue Advair 500 Moderate persistent without complications Albuterol  as needed Avoid Allergens and Irritants Avoid secondhand smoke Avoid SICK contacts Recommend  Masking  when appropriate Recommend Keep up-to-date with vaccinations We will need to strongly consider biologic agents as therapy However I would proceed with sleep study and treat underlying  sleep apnea as soon as possible  Obesity -recommend significant weight loss -recommend changing diet  Deconditioned state -Recommend increased daily activity and exercise   MEDICATION ADJUSTMENTS/LABS AND TESTS ORDERED: Recommend Sleep Study Recommend weight loss Continue inhalers as prescribed  CURRENT MEDICATIONS REVIEWED AT LENGTH WITH PATIENT TODAY   Patient  satisfied with Plan of action and management. All questions answered   Follow up 3 months   I spent a total of 65 minutes dedicated to the care of this patient on the date of this encounter to include pre-visit review of records, face-to-face time with the patient discussing conditions above, post visit ordering of testing, clinical documentation with the electronic health record, making appropriate referrals as documented, and communicating necessary information to the patient's healthcare team.     Nickolas Alm Cellar, M.D.  [facility]  Medical Director [facility] [place]        [1] No Known Allergies

## 2023-12-15 NOTE — Patient Instructions (Signed)
 Recommend in-lab sleep study Continue inhalers as prescribed Recommend weight loss  Avoid Allergens and Irritants Avoid secondhand smoke Avoid SICK contacts Recommend  Masking  when appropriate Recommend Keep up-to-date with vaccinations

## 2024-01-18 ENCOUNTER — Ambulatory Visit: Attending: Sleep Medicine

## 2024-01-18 DIAGNOSIS — G4733 Obstructive sleep apnea (adult) (pediatric): Secondary | ICD-10-CM | POA: Insufficient documentation

## 2024-01-23 ENCOUNTER — Emergency Department

## 2024-01-23 ENCOUNTER — Other Ambulatory Visit: Payer: Self-pay

## 2024-01-23 ENCOUNTER — Emergency Department
Admission: EM | Admit: 2024-01-23 | Discharge: 2024-01-23 | Disposition: A | Discharge status: Home / Self Care | Attending: Emergency Medicine | Admitting: Emergency Medicine

## 2024-01-23 DIAGNOSIS — I10 Essential (primary) hypertension: Secondary | ICD-10-CM | POA: Diagnosis not present

## 2024-01-23 DIAGNOSIS — R0602 Shortness of breath: Secondary | ICD-10-CM

## 2024-01-23 DIAGNOSIS — J45901 Unspecified asthma with (acute) exacerbation: Secondary | ICD-10-CM | POA: Insufficient documentation

## 2024-01-23 DIAGNOSIS — J45909 Unspecified asthma, uncomplicated: Secondary | ICD-10-CM

## 2024-01-23 LAB — BASIC METABOLIC PANEL WITH GFR
Anion gap: 10 (ref 5–15)
BUN: 14 mg/dL (ref 6–20)
CO2: 27 mmol/L (ref 22–32)
Calcium: 9.5 mg/dL (ref 8.9–10.3)
Chloride: 99 mmol/L (ref 98–111)
Creatinine, Ser: 0.8 mg/dL (ref 0.61–1.24)
GFR, Estimated: >60 mL/min
Glucose, Bld: 104 mg/dL — ABNORMAL HIGH (ref 70–99)
Potassium: 4.1 mmol/L (ref 3.5–5.1)
Sodium: 136 mmol/L (ref 135–145)

## 2024-01-23 LAB — CBC
HCT: 40 % (ref 39.0–52.0)
Hemoglobin: 13 g/dL (ref 13.0–17.0)
MCH: 27.8 pg (ref 26.0–34.0)
MCHC: 32.5 g/dL (ref 30.0–36.0)
MCV: 85.5 fL (ref 80.0–100.0)
Platelets: 398 K/uL (ref 150–400)
RBC: 4.68 MIL/uL (ref 4.22–5.81)
RDW: 14.3 % (ref 11.5–15.5)
WBC: 7.9 K/uL (ref 4.0–10.5)
nRBC: 0 % (ref 0.0–0.2)

## 2024-01-23 MED ORDER — IPRATROPIUM-ALBUTEROL 0.5-2.5 (3) MG/3ML IN SOLN
3.0000 mL | Freq: Once | RESPIRATORY_TRACT | Status: AC
  Administered 2024-01-23: 3 mL via RESPIRATORY_TRACT
  Filled 2024-01-23: qty 6

## 2024-01-23 MED ORDER — PREDNISONE 10 MG PO TABS: 10.0000 mg | ORAL_TABLET | Freq: Every day | ORAL | 0 refills | Status: DC

## 2024-01-23 MED ORDER — ALBUTEROL SULFATE HFA 108 (90 BASE) MCG/ACT IN AERS: 2.0000 | INHALATION_SPRAY | Freq: Four times a day (QID) | RESPIRATORY_TRACT | 2 refills | Status: DC | PRN

## 2024-01-23 MED ORDER — ALBUTEROL SULFATE (2.5 MG/3ML) 0.083% IN NEBU: 2.5000 mg | INHALATION_SOLUTION | Freq: Four times a day (QID) | RESPIRATORY_TRACT | 1 refills | Status: DC | PRN

## 2024-01-23 MED ORDER — PREDNISONE 20 MG PO TABS
60.0000 mg | ORAL_TABLET | Freq: Once | ORAL | Status: AC
  Administered 2024-01-23: 60 mg via ORAL
  Filled 2024-01-23: qty 3

## 2024-01-23 MED ORDER — IPRATROPIUM-ALBUTEROL 0.5-2.5 (3) MG/3ML IN SOLN
3.0000 mL | Freq: Once | RESPIRATORY_TRACT | Status: AC
  Administered 2024-01-23: 3 mL via RESPIRATORY_TRACT

## 2024-01-23 NOTE — ED Provider Notes (Signed)
 "  [facility] Provider Note    Event Date/Time   First MD Initiated Contact with Patient 01/23/24 1350     (approximate)  History   Chief Complaint: Shortness of Breath  HPI  Justin Reid is a 44 y.o. male with a past medical history of asthma, hypertension, presents to the emergency department for shortness of breath.  According to the patient since last night he has had worsening shortness of breath and wheeze.  Patient denies any cough congestion or fever.  Patient states he ran out of his albuterol  inhaler but has been using his nebulizer.  Patient states that shortness of breath felt worse so he came to the emergency department for evaluation.  Physical Exam   Triage Vital Signs: ED Triage Vitals  Encounter Vitals Group     BP 01/23/24 1318 (!) 149/99     Girls Systolic BP Percentile --      Girls Diastolic BP Percentile --      Boys Systolic BP Percentile --      Boys Diastolic BP Percentile --      Pulse Rate 01/23/24 1318 97     Resp 01/23/24 1318 (!) 24     Temp 01/23/24 1318 97.6 F (36.4 C)     Temp src --      SpO2 01/23/24 1318 97 %     Weight 01/23/24 1317 (!) 320 lb (145.2 kg)     Height 01/23/24 1317 5' 11 (1.803 m)     Head Circumference --      Peak Flow --      Pain Score 01/23/24 1317 0     Pain Loc --      Pain Education --      Exclude from Growth Chart --     Most recent vital signs: Vitals:   01/23/24 1318  BP: (!) 149/99  Pulse: 97  Resp: (!) 24  Temp: 97.6 F (36.4 C)  SpO2: 97%    General: Awake, no distress.  CV:  Good peripheral perfusion.  Regular rate and rhythm  Resp:  Mild tachypnea with mild to moderate expiratory wheeze bilaterally.  No rales or rhonchi Abd:  No distention.  Soft, nontender.  No rebound or guarding.  ED Results / Procedures / Treatments   EKG  EKG viewed and interpreted by myself shows a normal sinus rhythm at 90 bpm with a narrow QRS, normal axis, normal intervals, no  concerning ST changes.  RADIOLOGY  I reviewed interpret the chest x-ray images.  No consolidation seen on my evaluation.   MEDICATIONS ORDERED IN ED: Medications  ipratropium-albuterol  (DUONEB) 0.5-2.5 (3) MG/3ML nebulizer solution 3 mL (has no administration in time range)  ipratropium-albuterol  (DUONEB) 0.5-2.5 (3) MG/3ML nebulizer solution 3 mL (has no administration in time range)  predniSONE  (DELTASONE ) tablet 60 mg (has no administration in time range)     IMPRESSION / MDM / ASSESSMENT AND PLAN / ED COURSE  I reviewed the triage vital signs and the nursing notes.  Patient's presentation is most consistent with acute presentation with potential threat to life or bodily function.  Patient presents to the emergency department for worsening shortness of breath.  Patient has a history of asthma has mild to moderate expiratory wheeze bilaterally with mild tachypnea suspect likely asthma exacerbation.  Will check basic labs and obtain a chest x-ray as a precaution.  Will dose DuoNebs start the patient on prednisone  and reassess.  Patient's chest x-ray is clear.  CBC  is normal, chemistry is normal.  Patient states he is feeling much better after breathing treatments.  Has a very minimal end expiratory wheeze but much improved.  Will discharge on prednisone  we will refill both albuterol  inhaler as well as albuterol  nebulizer solution for the patient.  Patient will follow-up with his doctor.  FINAL CLINICAL IMPRESSION(S) / ED DIAGNOSES   Asthma exacerbation   Note:  This document was prepared using Dragon voice recognition software and may include unintentional dictation errors.   Dorothyann Drivers, MD 01/23/24 1455  "

## 2024-01-23 NOTE — ED Triage Notes (Addendum)
 Pt comes with sob that started yesterday. Pt has hx of asthma. Pt states worse when laying down and walking. Pt states no cp. Pt has labored breathing noted and wheezing. Pt has used inhaler with  little relief.

## 2024-01-23 NOTE — Discharge Instructions (Addendum)
 Please begin taking your steroids starting tomorrow 01/24/2024.  You may use albuterol  every 4 hours as needed for shortness of breath (inhaler or nebulized treatment).  Return to the emergency department for any worsening shortness of breath, any chest pain, or any other symptom personally concerning to yourself.

## 2024-01-27 DIAGNOSIS — G4733 Obstructive sleep apnea (adult) (pediatric): Secondary | ICD-10-CM | POA: Diagnosis not present

## 2024-02-01 ENCOUNTER — Ambulatory Visit (INDEPENDENT_AMBULATORY_CARE_PROVIDER_SITE_OTHER): Payer: Self-pay | Admitting: Sleep Medicine

## 2024-02-02 ENCOUNTER — Other Ambulatory Visit: Payer: Self-pay

## 2024-02-02 ENCOUNTER — Emergency Department

## 2024-02-02 ENCOUNTER — Encounter: Payer: Self-pay | Admitting: Emergency Medicine

## 2024-02-02 ENCOUNTER — Emergency Department
Admission: EM | Admit: 2024-02-02 | Discharge: 2024-02-02 | Disposition: A | Discharge status: Home / Self Care | Attending: Emergency Medicine | Admitting: Emergency Medicine

## 2024-02-02 DIAGNOSIS — J45901 Unspecified asthma with (acute) exacerbation: Secondary | ICD-10-CM | POA: Diagnosis not present

## 2024-02-02 DIAGNOSIS — R10A2 Flank pain, left side: Secondary | ICD-10-CM | POA: Insufficient documentation

## 2024-02-02 DIAGNOSIS — Z5329 Procedure and treatment not carried out because of patient's decision for other reasons: Secondary | ICD-10-CM | POA: Diagnosis not present

## 2024-02-02 DIAGNOSIS — R0602 Shortness of breath: Secondary | ICD-10-CM | POA: Diagnosis present

## 2024-02-02 LAB — CBC WITH DIFFERENTIAL/PLATELET
Abs Immature Granulocytes: 0.07 10*3/uL (ref 0.00–0.07)
Basophils Absolute: 0 10*3/uL (ref 0.0–0.1)
Basophils Relative: 0 %
Eosinophils Absolute: 0 10*3/uL (ref 0.0–0.5)
Eosinophils Relative: 0 %
HCT: 41.5 % (ref 39.0–52.0)
Hemoglobin: 13 g/dL (ref 13.0–17.0)
Immature Granulocytes: 1 %
Lymphocytes Relative: 14 %
Lymphs Abs: 1.9 10*3/uL (ref 0.7–4.0)
MCH: 27 pg (ref 26.0–34.0)
MCHC: 31.3 g/dL (ref 30.0–36.0)
MCV: 86.1 fL (ref 80.0–100.0)
Monocytes Absolute: 0.4 10*3/uL (ref 0.1–1.0)
Monocytes Relative: 3 %
Neutro Abs: 11.6 10*3/uL — ABNORMAL HIGH (ref 1.7–7.7)
Neutrophils Relative %: 82 %
Platelets: 468 10*3/uL — ABNORMAL HIGH (ref 150–400)
RBC: 4.82 MIL/uL (ref 4.22–5.81)
RDW: 14.6 % (ref 11.5–15.5)
WBC: 13.9 10*3/uL — ABNORMAL HIGH (ref 4.0–10.5)
nRBC: 0 % (ref 0.0–0.2)

## 2024-02-02 LAB — RESP PANEL BY RT-PCR (RSV, FLU A&B, COVID)  RVPGX2
Influenza A by PCR: NEGATIVE
Influenza B by PCR: NEGATIVE
Resp Syncytial Virus by PCR: NEGATIVE
SARS Coronavirus 2 by RT PCR: NEGATIVE

## 2024-02-02 LAB — BLOOD GAS, VENOUS
Acid-Base Excess: 2.7 mmol/L — ABNORMAL HIGH (ref 0.0–2.0)
Bicarbonate: 27.9 mmol/L (ref 20.0–28.0)
O2 Saturation: 70 %
Patient temperature: 37
pCO2, Ven: 44 mmHg (ref 44–60)
pH, Ven: 7.41 (ref 7.25–7.43)
pO2, Ven: 40 mmHg (ref 32–45)

## 2024-02-02 LAB — LIPASE, BLOOD: Lipase: 29 U/L (ref 11–51)

## 2024-02-02 LAB — COMPREHENSIVE METABOLIC PANEL WITH GFR
ALT: 22 U/L (ref 0–44)
AST: 18 U/L (ref 15–41)
Albumin: 3.9 g/dL (ref 3.5–5.0)
Alkaline Phosphatase: 126 U/L (ref 38–126)
Anion gap: 11 (ref 5–15)
BUN: 22 mg/dL — ABNORMAL HIGH (ref 6–20)
CO2: 26 mmol/L (ref 22–32)
Calcium: 9.2 mg/dL (ref 8.9–10.3)
Chloride: 99 mmol/L (ref 98–111)
Creatinine, Ser: 0.93 mg/dL (ref 0.61–1.24)
GFR, Estimated: >60 mL/min
Glucose, Bld: 130 mg/dL — ABNORMAL HIGH (ref 70–99)
Potassium: 4.4 mmol/L (ref 3.5–5.1)
Sodium: 135 mmol/L (ref 135–145)
Total Bilirubin: 0.3 mg/dL (ref 0.0–1.2)
Total Protein: 7.5 g/dL (ref 6.5–8.1)

## 2024-02-02 LAB — PRO BRAIN NATRIURETIC PEPTIDE: Pro Brain Natriuretic Peptide: <50 pg/mL

## 2024-02-02 LAB — TROPONIN T, HIGH SENSITIVITY: Troponin T High Sensitivity: <6 ng/L (ref 0–19)

## 2024-02-02 MED ORDER — AZITHROMYCIN 250 MG PO TABS: ORAL_TABLET | ORAL | 0 refills | Status: AC

## 2024-02-02 MED ORDER — ALBUTEROL SULFATE (2.5 MG/3ML) 0.083% IN NEBU: 2.5000 mg | INHALATION_SOLUTION | RESPIRATORY_TRACT | 0 refills | Status: AC | PRN

## 2024-02-02 MED ORDER — PREDNISONE 10 MG PO TABS: 10.0000 mg | ORAL_TABLET | Freq: Every day | ORAL | 0 refills | Status: AC

## 2024-02-02 MED ORDER — IOHEXOL 350 MG/ML SOLN
100.0000 mL | Freq: Once | INTRAVENOUS | Status: AC | PRN
  Administered 2024-02-02: 100 mL via INTRAVENOUS

## 2024-02-02 MED ORDER — METHYLPREDNISOLONE SODIUM SUCC 125 MG IJ SOLR
125.0000 mg | Freq: Once | INTRAMUSCULAR | Status: AC
  Administered 2024-02-02: 125 mg via INTRAVENOUS
  Filled 2024-02-02: qty 2

## 2024-02-02 MED ORDER — METHYLPREDNISOLONE SODIUM SUCC 125 MG IJ SOLR: 125.0000 mg | Freq: Once | INTRAMUSCULAR | Status: DC

## 2024-02-02 MED ORDER — KETOROLAC TROMETHAMINE 15 MG/ML IJ SOLN
15.0000 mg | Freq: Once | INTRAMUSCULAR | Status: AC
  Administered 2024-02-02: 15 mg via INTRAVENOUS
  Filled 2024-02-02: qty 1

## 2024-02-02 MED ORDER — LIDOCAINE 5 % EX PTCH
1.0000 | MEDICATED_PATCH | CUTANEOUS | Status: DC
  Administered 2024-02-02: 1 via TRANSDERMAL
  Filled 2024-02-02: qty 1

## 2024-02-02 MED ORDER — IPRATROPIUM-ALBUTEROL 0.5-2.5 (3) MG/3ML IN SOLN
9.0000 mL | Freq: Once | RESPIRATORY_TRACT | Status: AC
  Administered 2024-02-02: 9 mL via RESPIRATORY_TRACT
  Filled 2024-02-02: qty 9

## 2024-02-02 MED ORDER — ALBUTEROL SULFATE HFA 108 (90 BASE) MCG/ACT IN AERS: 2.0000 | INHALATION_SPRAY | Freq: Four times a day (QID) | RESPIRATORY_TRACT | 2 refills | Status: AC | PRN

## 2024-02-02 MED ORDER — MAGNESIUM SULFATE 2 GM/50ML IV SOLN
2.0000 g | Freq: Once | INTRAVENOUS | Status: AC
  Administered 2024-02-02: 2 g via INTRAVENOUS
  Filled 2024-02-02: qty 50

## 2024-02-02 NOTE — ED Notes (Signed)
 Pt ambulated on room air. O2 maintained at 98%.

## 2024-02-02 NOTE — Progress Notes (Signed)
 Can review during follow up visit.

## 2024-02-02 NOTE — ED Provider Notes (Addendum)
 "  [facility] Provider Note    Event Date/Time   First MD Initiated Contact with Patient 02/02/24 0732     (approximate)   History   Shortness of Breath and Abdominal Pain   HPI  Justin Reid is a 44 y.o. male with asthma who comes in with concerns for shortness of breath.  Patient reports having worsening shortness of breath that started yesterday.  He reports recently completing a course of steroids and having his inhalers and nebulizers at home and being compliant with them.  He reports that regardless of that he is continued to feel more short of breath.  He reports that he is at the point where he is needed to have BiPAP in the past and that has helped his symptoms.  He also reports some left l flank pain that is worse with movement.  Reports this also came on suddenly yesterday.  Denies any history of this previously.  Denies feeling like he could have pulled a muscle.  Physical Exam   Triage Vital Signs: ED Triage Vitals  Encounter Vitals Group     BP 02/02/24 0729 (!) 159/114     Girls Systolic BP Percentile --      Girls Diastolic BP Percentile --      Boys Systolic BP Percentile --      Boys Diastolic BP Percentile --      Pulse Rate 02/02/24 0729 (!) 110     Resp 02/02/24 0729 20     Temp 02/02/24 0729 98.2 F (36.8 C)     Temp src --      SpO2 02/02/24 0729 98 %     Weight 02/02/24 0730 (!) 319 lb 10.7 oz (145 kg)     Height 02/02/24 0730 5' 11 (1.803 m)     Head Circumference --      Peak Flow --      Pain Score 02/02/24 0730 10     Pain Loc --      Pain Education --      Exclude from Growth Chart --     Most recent vital signs: Vitals:   02/02/24 0729  BP: (!) 159/114  Pulse: (!) 110  Resp: 20  Temp: 98.2 F (36.8 C)  SpO2: 98%     General: Awake, no distress.  CV:  Good peripheral perfusion.  Resp:  Diffuse wheezing noted.  Increased work of breathing Abd:  No distention.  Tender on the left side of his abdomen, left  flank area Other:  No swelling in legs.  No calf tenderness   ED Results / Procedures / Treatments   Labs (all labs ordered are listed, but only abnormal results are displayed) Labs Reviewed  CBC WITH DIFFERENTIAL/PLATELET - Abnormal; Notable for the following components:      Result Value   WBC 13.9 (*)    Platelets 468 (*)    Neutro Abs 11.6 (*)    All other components within normal limits  COMPREHENSIVE METABOLIC PANEL WITH GFR - Abnormal; Notable for the following components:   Glucose, Bld 130 (*)    BUN 22 (*)    All other components within normal limits  BLOOD GAS, VENOUS - Abnormal; Notable for the following components:   Acid-Base Excess 2.7 (*)    All other components within normal limits  RESP PANEL BY RT-PCR (RSV, FLU A&B, COVID)  RVPGX2  PRO BRAIN NATRIURETIC PEPTIDE  LIPASE, BLOOD  URINALYSIS, ROUTINE W REFLEX MICROSCOPIC  TROPONIN T, HIGH SENSITIVITY  TROPONIN T, HIGH SENSITIVITY     EKG  My interpretation of EKG:  Sinus tachycardia with a rate of 108 without any ST elevation or T wave inversions, normal intervals.  RADIOLOGY I have reviewed the xray personally and interpreted no obvious pneumonia   PROCEDURES:  Critical Care performed: Yes, see critical care procedure note(s)  .1-3 Lead EKG Interpretation  Performed by: Ernest Ronal BRAVO, MD Authorized by: Ernest Ronal BRAVO, MD     Interpretation: abnormal     ECG rate:  110   ECG rate assessment: tachycardic     Rhythm: sinus tachycardia     Ectopy: none     Conduction: normal   .Critical Care  Performed by: Ernest Ronal BRAVO, MD Authorized by: Ernest Ronal BRAVO, MD   Critical care provider statement:    Critical care time (minutes):  30   Critical care was necessary to treat or prevent imminent or life-threatening deterioration of the following conditions:  Respiratory failure   Critical care was time spent personally by me on the following activities:  Development of treatment plan with patient or  surrogate, discussions with consultants, evaluation of patient's response to treatment, examination of patient, ordering and review of laboratory studies, ordering and review of radiographic studies, ordering and performing treatments and interventions, pulse oximetry, re-evaluation of patient's condition and review of old charts    MEDICATIONS ORDERED IN ED: Medications  lidocaine  (LIDODERM ) 5 % 1 patch (1 patch Transdermal Patch Applied 02/02/24 0804)  ipratropium-albuterol  (DUONEB) 0.5-2.5 (3) MG/3ML nebulizer solution 9 mL (9 mLs Nebulization Given 02/02/24 0745)  methylPREDNISolone  sodium succinate (SOLU-MEDROL ) 125 mg/2 mL injection 125 mg (125 mg Intravenous Given 02/02/24 0800)  magnesium  sulfate IVPB 2 g 50 mL (2 g Intravenous New Bag/Given 02/02/24 0815)  ketorolac  (TORADOL ) 15 MG/ML injection 15 mg (15 mg Intravenous Given 02/02/24 0802)     IMPRESSION / MDM / ASSESSMENT AND PLAN / ED COURSE  I reviewed the triage vital signs and the nursing notes.   Patient's presentation is most consistent with acute presentation with potential threat to life or bodily function.  Suspect most likely asthma exacerbation.  No evidence of DVT on examination we will get blood work to evaluate for CHF, ACS.  Will treat patient's abdominal pain with Toradol , lidocaine  patch but seems more likely musculoskeletal in nature given is worse with movement.  Will treat patient's asthma exacerbation with DuoNebs, steroids.   8:15 AM patient still having significant work of breathing even after DuoNebs.  Tight air exchange.  Will start on BiPAP as patient reports symptoms have improved significantly with BiPAP.  9:17 AM patient breathing much more comfortably on the BiPAP.  Blood work so far is reassuring with normal troponin normal BNP normal VBG.  Discussed with patient and given elevated white count with abdominal pain we will get CT imaging to evaluate for any evidence of diverticulitis although elevated white  count could be just secondary to the recent prednisone .  Did discuss with patient getting a CT PE given he was tachycardic.  Discussed with him that if this feels similar to his prior asthma exacerbations I would not recommend it but given here is reporting new left flank pain and the tachycardia could consider PE scan.  Patient wanted proceed with PE scan.  Patient will require admission to the hospital after CT imaging.  CT imaging was reassuring no large PE and I do lower suspicion for this although that is not great timing  my suspicion overall is low.  His CT of his abdomen and pelvis was also negative for acute pathology.  Will discuss to hospital team for admission  11:57 AM patient is now declining admission states that he is feeling much improved.  Discussed with patient I do not recommend this I would recommend admission given he has been on BiPAP that typically people need to be monitored for a few hours being increased breathing treatments to the risk for having a rebound reaction but patient states that he is very familiar with his asthma and he does not feel he needs to be admitted declines admission.  I did recommend that we at least monitor him for an hour or 2 to ensure no evidence of rebound reaction and need for additional breathing treatments.  1:32 PM again discussed with patient that I would recommend admission and that this is AGAINST MEDICAL ADVICE to be leaving.  Patient expressed understanding.  He understands the risk for worsening breathing, death, hypoxia but patient states that he is feeling much improved and preferred to go home.  Patient ambulatory sat is normal repeat lung examination he does not have any wheezing.  Patient is out of his inhalers and nebulizer so I will prescribe these as well as a an additional steroid course, azithromycin  to help with any inflammation.  Patient expressed understanding and again declined admission.  Pt declined UA   The patient is on the  cardiac monitor to evaluate for evidence of arrhythmia and/or significant heart rate changes.      FINAL CLINICAL IMPRESSION(S) / ED DIAGNOSES   Final diagnoses:  Severe asthma with exacerbation, unspecified whether persistent  Left against medical advice     Rx / DC Orders   ED Discharge Orders          Ordered    predniSONE  (DELTASONE ) 10 MG tablet  Daily        02/02/24 1338    albuterol  (VENTOLIN  HFA) 108 (90 Base) MCG/ACT inhaler  Every 6 hours PRN        02/02/24 1338    azithromycin  (ZITHROMAX  Z-PAK) 250 MG tablet        02/02/24 1338    albuterol  (PROVENTIL ) (2.5 MG/3ML) 0.083% nebulizer solution  Every 4 hours PRN        02/02/24 1338             Note:  This document was prepared using Dragon voice recognition software and may include unintentional dictation errors.   Ernest Ronal BRAVO, MD 02/02/24 1139    Ernest Ronal BRAVO, MD 02/02/24 1339    Ernest Ronal BRAVO, MD 02/02/24 1340  "

## 2024-02-02 NOTE — ED Triage Notes (Signed)
 Pt to ED via POV for shortness of breath and LLQ abdominal pain. Pt has hx/o asthma. Pt has audible wheezing. Pt states that he has used his inhaler without relief.

## 2024-02-02 NOTE — Discharge Instructions (Addendum)
 We recommend admission to the hospital given your asthma exacerbation.  Your CT imaging was reassuring.  I prescribed you medications to try to help and you need to follow-up with a primary care doctor return to the ER if develop worsening symptoms or other concerns.

## 2024-03-22 ENCOUNTER — Ambulatory Visit: Admitting: Internal Medicine
